# Patient Record
Sex: Male | Born: 1965 | Race: White | Hispanic: No | Marital: Married | Smoking: Never smoker
Health system: Southern US, Community
[De-identification: ages and names within clinical notes are randomized; demographics above are authoritative.]

## PROBLEM LIST (undated history)

## (undated) DIAGNOSIS — I10 Essential (primary) hypertension: Secondary | ICD-10-CM

## (undated) DIAGNOSIS — I219 Acute myocardial infarction, unspecified: Secondary | ICD-10-CM

## (undated) DIAGNOSIS — K649 Unspecified hemorrhoids: Secondary | ICD-10-CM

## (undated) DIAGNOSIS — I639 Cerebral infarction, unspecified: Secondary | ICD-10-CM

## (undated) DIAGNOSIS — G4730 Sleep apnea, unspecified: Secondary | ICD-10-CM

## (undated) DIAGNOSIS — F329 Major depressive disorder, single episode, unspecified: Secondary | ICD-10-CM

## (undated) DIAGNOSIS — I2510 Atherosclerotic heart disease of native coronary artery without angina pectoris: Secondary | ICD-10-CM

## (undated) DIAGNOSIS — E785 Hyperlipidemia, unspecified: Secondary | ICD-10-CM

## (undated) DIAGNOSIS — J302 Other seasonal allergic rhinitis: Secondary | ICD-10-CM

## (undated) DIAGNOSIS — E109 Type 1 diabetes mellitus without complications: Secondary | ICD-10-CM

## (undated) DIAGNOSIS — Z9981 Dependence on supplemental oxygen: Secondary | ICD-10-CM

## (undated) DIAGNOSIS — F419 Anxiety disorder, unspecified: Secondary | ICD-10-CM

## (undated) DIAGNOSIS — G40909 Epilepsy, unspecified, not intractable, without status epilepticus: Secondary | ICD-10-CM

## (undated) DIAGNOSIS — F32A Depression, unspecified: Secondary | ICD-10-CM

## (undated) DIAGNOSIS — S92911A Unspecified fracture of right toe(s), initial encounter for closed fracture: Secondary | ICD-10-CM

## (undated) DIAGNOSIS — H269 Unspecified cataract: Secondary | ICD-10-CM

## (undated) HISTORY — DX: Essential (primary) hypertension: I10

## (undated) HISTORY — DX: Anxiety disorder, unspecified: F41.9

## (undated) HISTORY — PX: CORONARY ANGIOPLASTY: SHX604

## (undated) HISTORY — DX: Type 1 diabetes mellitus without complications: E10.9

## (undated) HISTORY — PX: CATARACT EXTRACTION, BILATERAL: SHX1313

## (undated) HISTORY — DX: Major depressive disorder, single episode, unspecified: F32.9

## (undated) HISTORY — DX: Acute myocardial infarction, unspecified: I21.9

## (undated) HISTORY — DX: Other seasonal allergic rhinitis: J30.2

## (undated) HISTORY — PX: CORONARY ARTERY BYPASS GRAFT: SHX141

## (undated) HISTORY — PX: TONSILLECTOMY: SUR1361

## (undated) HISTORY — DX: Atherosclerotic heart disease of native coronary artery without angina pectoris: I25.10

## (undated) HISTORY — PX: APPENDECTOMY: SHX54

## (undated) HISTORY — DX: Epilepsy, unspecified, not intractable, without status epilepticus: G40.909

## (undated) HISTORY — DX: Cerebral infarction, unspecified: I63.9

## (undated) HISTORY — PX: CARDIAC CATHETERIZATION: SHX172

## (undated) HISTORY — DX: Hyperlipidemia, unspecified: E78.5

## (undated) HISTORY — DX: Depression, unspecified: F32.A

## (undated) HISTORY — DX: Sleep apnea, unspecified: G47.30

## (undated) SURGERY — APPENDECTOMY, LAPAROSCOPIC
Anesthesia: General

---

## 1998-06-06 ENCOUNTER — Ambulatory Visit (HOSPITAL_COMMUNITY): Admission: RE | Admit: 1998-06-06 | Discharge: 1998-06-06 | Payer: Self-pay | Admitting: *Deleted

## 1998-07-01 ENCOUNTER — Encounter: Payer: Self-pay | Admitting: Otolaryngology

## 1998-07-01 ENCOUNTER — Ambulatory Visit (HOSPITAL_COMMUNITY): Admission: RE | Admit: 1998-07-01 | Discharge: 1998-07-01 | Payer: Self-pay | Admitting: Otolaryngology

## 1998-07-02 ENCOUNTER — Encounter: Payer: Self-pay | Admitting: Otolaryngology

## 1998-07-02 ENCOUNTER — Ambulatory Visit (HOSPITAL_COMMUNITY): Admission: RE | Admit: 1998-07-02 | Discharge: 1998-07-02 | Payer: Self-pay | Admitting: Otolaryngology

## 1998-08-15 ENCOUNTER — Encounter: Payer: Self-pay | Admitting: Otolaryngology

## 1998-08-15 ENCOUNTER — Ambulatory Visit (HOSPITAL_COMMUNITY): Admission: RE | Admit: 1998-08-15 | Discharge: 1998-08-16 | Payer: Self-pay | Admitting: Otolaryngology

## 1999-03-05 ENCOUNTER — Ambulatory Visit (HOSPITAL_COMMUNITY): Admission: RE | Admit: 1999-03-05 | Discharge: 1999-03-05 | Payer: Self-pay | Admitting: Otolaryngology

## 1999-03-05 ENCOUNTER — Encounter: Payer: Self-pay | Admitting: Otolaryngology

## 1999-05-02 ENCOUNTER — Encounter: Payer: Self-pay | Admitting: Neurosurgery

## 1999-05-02 ENCOUNTER — Ambulatory Visit (HOSPITAL_COMMUNITY): Admission: RE | Admit: 1999-05-02 | Discharge: 1999-05-02 | Payer: Self-pay | Admitting: Neurosurgery

## 1999-08-12 ENCOUNTER — Encounter: Payer: Self-pay | Admitting: Neurosurgery

## 1999-08-12 ENCOUNTER — Ambulatory Visit (HOSPITAL_COMMUNITY): Admission: RE | Admit: 1999-08-12 | Discharge: 1999-08-12 | Payer: Self-pay | Admitting: Neurosurgery

## 1999-08-13 ENCOUNTER — Ambulatory Visit (HOSPITAL_COMMUNITY): Admission: RE | Admit: 1999-08-13 | Discharge: 1999-08-13 | Payer: Self-pay | Admitting: Neurosurgery

## 1999-08-13 ENCOUNTER — Encounter: Payer: Self-pay | Admitting: Neurosurgery

## 2000-02-11 ENCOUNTER — Ambulatory Visit (HOSPITAL_COMMUNITY): Admission: RE | Admit: 2000-02-11 | Discharge: 2000-02-11 | Payer: Self-pay | Admitting: Neurology

## 2000-02-11 ENCOUNTER — Encounter: Payer: Self-pay | Admitting: Neurology

## 2000-03-15 ENCOUNTER — Encounter: Payer: Self-pay | Admitting: Internal Medicine

## 2000-03-15 ENCOUNTER — Encounter: Admission: RE | Admit: 2000-03-15 | Discharge: 2000-03-15 | Payer: Self-pay | Admitting: Internal Medicine

## 2002-11-23 ENCOUNTER — Ambulatory Visit (HOSPITAL_COMMUNITY): Admission: RE | Admit: 2002-11-23 | Discharge: 2002-11-23 | Payer: Self-pay | Admitting: Neurology

## 2002-11-23 ENCOUNTER — Encounter: Payer: Self-pay | Admitting: Neurology

## 2004-11-04 ENCOUNTER — Ambulatory Visit (HOSPITAL_COMMUNITY): Admission: RE | Admit: 2004-11-04 | Discharge: 2004-11-04 | Payer: Self-pay | Admitting: Neurology

## 2005-08-01 ENCOUNTER — Emergency Department (HOSPITAL_COMMUNITY): Admission: EM | Admit: 2005-08-01 | Discharge: 2005-08-01 | Payer: Self-pay | Admitting: Emergency Medicine

## 2006-01-25 ENCOUNTER — Emergency Department (HOSPITAL_COMMUNITY): Admission: EM | Admit: 2006-01-25 | Discharge: 2006-01-25 | Payer: Self-pay | Admitting: Emergency Medicine

## 2006-05-10 ENCOUNTER — Emergency Department (HOSPITAL_COMMUNITY): Admission: EM | Admit: 2006-05-10 | Discharge: 2006-05-10 | Payer: Self-pay | Admitting: Emergency Medicine

## 2006-05-10 ENCOUNTER — Encounter: Admission: RE | Admit: 2006-05-10 | Discharge: 2006-05-10 | Payer: Self-pay | Admitting: Neurology

## 2007-06-26 ENCOUNTER — Ambulatory Visit: Payer: Self-pay | Admitting: Gastroenterology

## 2007-06-26 LAB — CONVERTED CEMR LAB
Eosinophils Absolute: 0.1 10*3/uL (ref 0.0–0.6)
Eosinophils Relative: 2.2 % (ref 0.0–5.0)
Hemoglobin: 15.3 g/dL (ref 13.0–17.0)
Lymphocytes Relative: 26.4 % (ref 12.0–46.0)
Platelets: 237 10*3/uL (ref 150–400)
RBC: 4.78 M/uL (ref 4.22–5.81)
WBC: 5.1 10*3/uL (ref 4.5–10.5)

## 2007-07-25 ENCOUNTER — Ambulatory Visit: Payer: Self-pay | Admitting: Gastroenterology

## 2007-09-15 ENCOUNTER — Encounter: Admission: RE | Admit: 2007-09-15 | Discharge: 2007-09-15 | Payer: Self-pay | Admitting: Internal Medicine

## 2007-10-27 HISTORY — PX: OTHER SURGICAL HISTORY: SHX169

## 2007-11-24 ENCOUNTER — Ambulatory Visit (HOSPITAL_BASED_OUTPATIENT_CLINIC_OR_DEPARTMENT_OTHER): Admission: RE | Admit: 2007-11-24 | Discharge: 2007-11-24 | Payer: Self-pay | Admitting: Orthopedic Surgery

## 2008-01-09 ENCOUNTER — Ambulatory Visit (HOSPITAL_BASED_OUTPATIENT_CLINIC_OR_DEPARTMENT_OTHER): Admission: RE | Admit: 2008-01-09 | Discharge: 2008-01-09 | Payer: Self-pay | Admitting: Orthopedic Surgery

## 2008-01-18 ENCOUNTER — Encounter (INDEPENDENT_AMBULATORY_CARE_PROVIDER_SITE_OTHER): Payer: Self-pay | Admitting: Otolaryngology

## 2008-01-19 ENCOUNTER — Inpatient Hospital Stay (HOSPITAL_COMMUNITY): Admission: AD | Admit: 2008-01-19 | Discharge: 2008-01-20 | Payer: Self-pay | Admitting: Otolaryngology

## 2009-10-12 ENCOUNTER — Inpatient Hospital Stay (HOSPITAL_COMMUNITY): Admission: EM | Admit: 2009-10-12 | Discharge: 2009-10-23 | Payer: Self-pay | Admitting: Emergency Medicine

## 2009-10-12 ENCOUNTER — Ambulatory Visit: Payer: Self-pay | Admitting: Internal Medicine

## 2009-10-14 ENCOUNTER — Encounter: Payer: Self-pay | Admitting: Cardiothoracic Surgery

## 2009-10-14 ENCOUNTER — Ambulatory Visit: Payer: Self-pay | Admitting: Cardiothoracic Surgery

## 2009-10-15 ENCOUNTER — Encounter: Payer: Self-pay | Admitting: Cardiology

## 2009-11-03 ENCOUNTER — Ambulatory Visit: Payer: Self-pay | Admitting: Thoracic Surgery (Cardiothoracic Vascular Surgery)

## 2009-11-10 ENCOUNTER — Ambulatory Visit: Payer: Self-pay | Admitting: Cardiothoracic Surgery

## 2009-11-10 ENCOUNTER — Encounter: Admission: RE | Admit: 2009-11-10 | Discharge: 2009-11-10 | Payer: Self-pay | Admitting: Cardiothoracic Surgery

## 2009-11-17 ENCOUNTER — Ambulatory Visit: Payer: Self-pay | Admitting: Cardiothoracic Surgery

## 2009-11-25 ENCOUNTER — Encounter: Payer: Self-pay | Admitting: Cardiovascular Disease

## 2009-11-26 ENCOUNTER — Encounter: Payer: Self-pay | Admitting: Cardiovascular Disease

## 2009-11-26 ENCOUNTER — Ambulatory Visit: Payer: Self-pay | Admitting: Cardiothoracic Surgery

## 2009-11-28 ENCOUNTER — Ambulatory Visit: Payer: Self-pay | Admitting: Cardiovascular Disease

## 2009-11-28 DIAGNOSIS — I214 Non-ST elevation (NSTEMI) myocardial infarction: Secondary | ICD-10-CM

## 2009-11-28 DIAGNOSIS — I25810 Atherosclerosis of coronary artery bypass graft(s) without angina pectoris: Secondary | ICD-10-CM | POA: Insufficient documentation

## 2009-11-28 DIAGNOSIS — E785 Hyperlipidemia, unspecified: Secondary | ICD-10-CM

## 2009-12-03 ENCOUNTER — Ambulatory Visit: Payer: Self-pay | Admitting: Cardiothoracic Surgery

## 2009-12-08 ENCOUNTER — Ambulatory Visit: Payer: Self-pay | Admitting: Cardiothoracic Surgery

## 2009-12-15 ENCOUNTER — Ambulatory Visit: Payer: Self-pay | Admitting: Cardiothoracic Surgery

## 2009-12-16 ENCOUNTER — Ambulatory Visit (HOSPITAL_COMMUNITY): Admission: RE | Admit: 2009-12-16 | Discharge: 2009-12-16 | Payer: Self-pay | Admitting: Cardiovascular Disease

## 2009-12-16 ENCOUNTER — Ambulatory Visit: Payer: Self-pay | Admitting: Internal Medicine

## 2009-12-16 ENCOUNTER — Ambulatory Visit: Payer: Self-pay

## 2009-12-16 ENCOUNTER — Encounter: Payer: Self-pay | Admitting: Cardiovascular Disease

## 2009-12-19 ENCOUNTER — Encounter: Payer: Self-pay | Admitting: Cardiovascular Disease

## 2009-12-26 ENCOUNTER — Ambulatory Visit: Payer: Self-pay | Admitting: Cardiovascular Disease

## 2009-12-31 ENCOUNTER — Ambulatory Visit: Payer: Self-pay | Admitting: Cardiothoracic Surgery

## 2010-01-01 ENCOUNTER — Encounter (HOSPITAL_COMMUNITY): Admission: RE | Admit: 2010-01-01 | Discharge: 2010-03-27 | Payer: Self-pay | Admitting: Cardiovascular Disease

## 2010-01-07 ENCOUNTER — Encounter: Payer: Self-pay | Admitting: Cardiovascular Disease

## 2010-01-29 ENCOUNTER — Encounter: Payer: Self-pay | Admitting: Cardiovascular Disease

## 2010-02-20 ENCOUNTER — Encounter: Payer: Self-pay | Admitting: Cardiovascular Disease

## 2010-03-03 ENCOUNTER — Telehealth: Payer: Self-pay | Admitting: Cardiovascular Disease

## 2010-03-04 ENCOUNTER — Ambulatory Visit: Payer: Self-pay | Admitting: Cardiovascular Disease

## 2010-03-05 ENCOUNTER — Inpatient Hospital Stay (HOSPITAL_BASED_OUTPATIENT_CLINIC_OR_DEPARTMENT_OTHER): Admission: RE | Admit: 2010-03-05 | Discharge: 2010-03-05 | Payer: Self-pay | Admitting: Cardiovascular Disease

## 2010-03-05 ENCOUNTER — Ambulatory Visit: Payer: Self-pay | Admitting: Cardiovascular Disease

## 2010-03-10 ENCOUNTER — Observation Stay (HOSPITAL_COMMUNITY): Admission: RE | Admit: 2010-03-10 | Discharge: 2010-03-11 | Payer: Self-pay | Admitting: Cardiovascular Disease

## 2010-03-10 ENCOUNTER — Ambulatory Visit: Payer: Self-pay | Admitting: Cardiology

## 2010-03-24 ENCOUNTER — Encounter: Payer: Self-pay | Admitting: Cardiovascular Disease

## 2010-03-25 ENCOUNTER — Telehealth (INDEPENDENT_AMBULATORY_CARE_PROVIDER_SITE_OTHER): Payer: Self-pay | Admitting: *Deleted

## 2010-03-26 ENCOUNTER — Telehealth (INDEPENDENT_AMBULATORY_CARE_PROVIDER_SITE_OTHER): Payer: Self-pay | Admitting: *Deleted

## 2010-03-28 ENCOUNTER — Encounter (HOSPITAL_COMMUNITY): Admission: RE | Admit: 2010-03-28 | Discharge: 2010-05-09 | Payer: Self-pay | Admitting: Cardiovascular Disease

## 2010-04-12 ENCOUNTER — Inpatient Hospital Stay (HOSPITAL_COMMUNITY)
Admission: EM | Admit: 2010-04-12 | Discharge: 2010-04-15 | Payer: Self-pay | Source: Home / Self Care | Admitting: Emergency Medicine

## 2010-04-13 ENCOUNTER — Ambulatory Visit: Payer: Self-pay | Admitting: Cardiology

## 2010-04-13 ENCOUNTER — Encounter: Payer: Self-pay | Admitting: Cardiovascular Disease

## 2010-04-20 ENCOUNTER — Encounter: Payer: Self-pay | Admitting: Cardiovascular Disease

## 2010-04-23 ENCOUNTER — Ambulatory Visit: Payer: Self-pay | Admitting: Cardiovascular Disease

## 2010-05-06 ENCOUNTER — Telehealth: Payer: Self-pay | Admitting: Cardiovascular Disease

## 2010-05-11 ENCOUNTER — Encounter (HOSPITAL_COMMUNITY)
Admission: RE | Admit: 2010-05-11 | Discharge: 2010-07-28 | Payer: Self-pay | Source: Home / Self Care | Attending: Cardiovascular Disease | Admitting: Cardiovascular Disease

## 2010-07-06 ENCOUNTER — Encounter: Payer: Self-pay | Admitting: Cardiovascular Disease

## 2010-07-15 ENCOUNTER — Encounter: Payer: Self-pay | Admitting: Cardiovascular Disease

## 2010-07-16 ENCOUNTER — Ambulatory Visit
Admission: RE | Admit: 2010-07-16 | Discharge: 2010-07-16 | Payer: Self-pay | Source: Home / Self Care | Attending: Cardiovascular Disease | Admitting: Cardiovascular Disease

## 2010-07-23 ENCOUNTER — Encounter: Payer: Self-pay | Admitting: Cardiovascular Disease

## 2010-07-26 LAB — CONVERTED CEMR LAB
BUN: 12 mg/dL (ref 6–23)
Basophils Relative: 0.6 % (ref 0.0–3.0)
Calcium: 9.2 mg/dL (ref 8.4–10.5)
Creatinine, Ser: 0.7 mg/dL (ref 0.4–1.5)
Eosinophils Relative: 2.5 % (ref 0.0–5.0)
GFR calc non Af Amer: 135.63 mL/min (ref >60)
Glucose, Bld: 105 mg/dL — ABNORMAL HIGH (ref 70–99)
INR: 1 (ref 0.8–1.0)
Lymphocytes Relative: 23.6 % (ref 12.0–46.0)
Lymphs Abs: 1.3 10*3/uL (ref 0.7–4.0)
MCHC: 34.4 g/dL (ref 30.0–36.0)
Monocytes Absolute: 0.6 10*3/uL (ref 0.1–1.0)
Monocytes Relative: 11.7 % (ref 3.0–12.0)
Neutro Abs: 3.4 10*3/uL (ref 1.4–7.7)
Neutrophils Relative %: 61.6 % (ref 43.0–77.0)
Prothrombin Time: 10.9 s (ref 9.7–11.8)
RBC: 4.71 M/uL (ref 4.22–5.81)
WBC: 5.5 10*3/uL (ref 4.5–10.5)

## 2010-07-29 ENCOUNTER — Emergency Department (HOSPITAL_COMMUNITY): Payer: No Typology Code available for payment source

## 2010-07-29 ENCOUNTER — Emergency Department (HOSPITAL_COMMUNITY)
Admission: EM | Admit: 2010-07-29 | Discharge: 2010-07-29 | Disposition: A | Discharge status: Home / Self Care | Payer: No Typology Code available for payment source | Attending: Emergency Medicine | Admitting: Emergency Medicine

## 2010-07-29 ENCOUNTER — Encounter (HOSPITAL_COMMUNITY): Payer: Self-pay | Attending: Cardiovascular Disease

## 2010-07-29 DIAGNOSIS — E785 Hyperlipidemia, unspecified: Secondary | ICD-10-CM | POA: Insufficient documentation

## 2010-07-29 DIAGNOSIS — I2510 Atherosclerotic heart disease of native coronary artery without angina pectoris: Secondary | ICD-10-CM | POA: Insufficient documentation

## 2010-07-29 DIAGNOSIS — Z794 Long term (current) use of insulin: Secondary | ICD-10-CM | POA: Insufficient documentation

## 2010-07-29 DIAGNOSIS — Y929 Unspecified place or not applicable: Secondary | ICD-10-CM | POA: Insufficient documentation

## 2010-07-29 DIAGNOSIS — Z9889 Other specified postprocedural states: Secondary | ICD-10-CM | POA: Insufficient documentation

## 2010-07-29 DIAGNOSIS — G4733 Obstructive sleep apnea (adult) (pediatric): Secondary | ICD-10-CM | POA: Insufficient documentation

## 2010-07-29 DIAGNOSIS — S20219A Contusion of unspecified front wall of thorax, initial encounter: Secondary | ICD-10-CM | POA: Insufficient documentation

## 2010-07-29 DIAGNOSIS — Z79899 Other long term (current) drug therapy: Secondary | ICD-10-CM | POA: Insufficient documentation

## 2010-07-29 DIAGNOSIS — I319 Disease of pericardium, unspecified: Secondary | ICD-10-CM | POA: Insufficient documentation

## 2010-07-29 DIAGNOSIS — I252 Old myocardial infarction: Secondary | ICD-10-CM | POA: Insufficient documentation

## 2010-07-29 DIAGNOSIS — Z9641 Presence of insulin pump (external) (internal): Secondary | ICD-10-CM | POA: Insufficient documentation

## 2010-07-29 DIAGNOSIS — R071 Chest pain on breathing: Secondary | ICD-10-CM | POA: Insufficient documentation

## 2010-07-29 DIAGNOSIS — Z5189 Encounter for other specified aftercare: Secondary | ICD-10-CM | POA: Insufficient documentation

## 2010-07-29 DIAGNOSIS — Q8500 Neurofibromatosis, unspecified: Secondary | ICD-10-CM | POA: Insufficient documentation

## 2010-07-29 DIAGNOSIS — Z951 Presence of aortocoronary bypass graft: Secondary | ICD-10-CM | POA: Insufficient documentation

## 2010-07-29 DIAGNOSIS — E109 Type 1 diabetes mellitus without complications: Secondary | ICD-10-CM | POA: Insufficient documentation

## 2010-07-29 DIAGNOSIS — I10 Essential (primary) hypertension: Secondary | ICD-10-CM | POA: Insufficient documentation

## 2010-07-29 DIAGNOSIS — I498 Other specified cardiac arrhythmias: Secondary | ICD-10-CM | POA: Insufficient documentation

## 2010-07-29 DIAGNOSIS — E119 Type 2 diabetes mellitus without complications: Secondary | ICD-10-CM | POA: Insufficient documentation

## 2010-07-29 DIAGNOSIS — F341 Dysthymic disorder: Secondary | ICD-10-CM | POA: Insufficient documentation

## 2010-07-29 LAB — CBC
HCT: 42.2 % (ref 39.0–52.0)
Hemoglobin: 14.6 g/dL (ref 13.0–17.0)
MCHC: 34.6 g/dL (ref 30.0–36.0)
MCV: 88.7 fL (ref 78.0–100.0)
RBC: 4.76 MIL/uL (ref 4.22–5.81)
RDW: 12.8 % (ref 11.5–15.5)

## 2010-07-29 LAB — POCT CARDIAC MARKERS

## 2010-07-29 LAB — BASIC METABOLIC PANEL
BUN: 10 mg/dL (ref 6–23)
CO2: 28 mEq/L (ref 19–32)
GFR calc Af Amer: >60 mL/min (ref >60)
GFR calc non Af Amer: >60 mL/min (ref >60)
Glucose, Bld: 109 mg/dL — ABNORMAL HIGH (ref 70–99)
Sodium: 141 mEq/L (ref 135–145)

## 2010-07-29 LAB — DIFFERENTIAL
Basophils Absolute: 0 10*3/uL (ref 0.0–0.1)
Eosinophils Absolute: 0.1 10*3/uL (ref 0.0–0.7)
Eosinophils Relative: 2 % (ref 0–5)
Lymphs Abs: 1 10*3/uL (ref 0.7–4.0)

## 2010-07-29 LAB — GLUCOSE, CAPILLARY: Glucose-Capillary: 89 mg/dL (ref 70–99)

## 2010-07-30 ENCOUNTER — Telehealth (INDEPENDENT_AMBULATORY_CARE_PROVIDER_SITE_OTHER): Payer: Self-pay | Admitting: *Deleted

## 2010-07-30 NOTE — Assessment & Plan Note (Signed)
Summary: eph/jss  Medications Added TYLENOL 325 MG TABS (ACETAMINOPHEN) as needed LISINOPRIL 20 MG TABS (LISINOPRIL) Take one tablet by mouth daily METOPROLOL TARTRATE 50 MG TABS (METOPROLOL TARTRATE) take 1 tablet twice a day VYTORIN 10-80 MG TABS (EZETIMIBE-SIMVASTATIN) Take one tablet by mouth dailyat bedtimer ASPIRIN 81 MG TBEC (ASPIRIN) Take one tablet by mouth daily TEGRETOL XR 400 MG XR12H-TAB (CARBAMAZEPINE) take 2 tablets two times a day TEGRETOL 200 MG TABS (CARBAMAZEPINE) Take 1 tablet by mouth two times a day LEVETIRACETAM 500 MG TABS (LEVETIRACETAM) Take 1/2 tablet in the morning and 1 tablet in the evening. ZOLOFT 100 MG TABS (SERTRALINE HCL) Take 1 tablet by mouth once a day FOLIC ACID 1 MG TABS (FOLIC ACID) Take 1 tablet by mouth once a day FERREX 150 FORTE 150-25-1 MG-MCG-MG CAPS (IRON POLYSACCH CMPLX-B12-FA) Take 1 tablet by mouth once a day ONE-A-DAY MENS  TABS (MULTIPLE VITAMIN) Take 1 tablet by mouth once a day TRAMADOL HCL 50 MG TABS (TRAMADOL HCL) as needed * NOVOLOG INSULIN PUM as directed      Allergies Added: NKDA  Visit Type:  Post hospital Primary Provider:  Rodrigo Ran, M.D.  CC:  Chest soreness.  History of Present Illness: 45 year-old male presenting for hospital follow-up evaluation after sustaining a NSTEMI and requiring multivessel CABG in April 2011. He has longstanding Type I DM diagnosed age 89, on an insulin pump since 2008. He has a nonhealing wound at the upper portion of his sternotomy.   At present he is doing well. Complains of distal legs feeling cool. Denies dyspnea, edema, fever, chills. complains of chest pain/discomfort around sternotomy, but different from anginal pain.   Current Medications (verified): 1)  Tylenol 325 Mg Tabs (Acetaminophen) .... As Needed 2)  Lisinopril 20 Mg Tabs (Lisinopril) .... Take One Tablet By Mouth Daily 3)  Metoprolol Tartrate 100 Mg Tabs (Metoprolol Tartrate) .... Take One Tablet By Mouth Twice A Day 4)   Vytorin 10-80 Mg Tabs (Ezetimibe-Simvastatin) .... Take One Tablet By Mouth Dailyat Bedtimer 5)  Aspirin 81 Mg Tbec (Aspirin) .... Take One Tablet By Mouth Daily 6)  Tegretol Xr 400 Mg Xr12h-Tab (Carbamazepine) .... Take 2 Tablets Two Times A Day 7)  Tegretol 200 Mg Tabs (Carbamazepine) .... Take 1 Tablet By Mouth Two Times A Day 8)  Levetiracetam 500 Mg Tabs (Levetiracetam) .... Take 1 Tablet By Mouth Two Times A Day 9)  Zoloft 100 Mg Tabs (Sertraline Hcl) .... Take 1 Tablet By Mouth Once A Day 10)  Folic Acid 1 Mg Tabs (Folic Acid) .... Take 1 Tablet By Mouth Once A Day 11)  Ferrex 150 Forte 150-25-1 Mg-Mcg-Mg Caps (Iron Polysacch Cmplx-B12-Fa) .... Take 1 Tablet By Mouth Once A Day 12)  One-A-Day Mens  Tabs (Multiple Vitamin) .... Take 1 Tablet By Mouth Once A Day 13)  Tramadol Hcl 50 Mg Tabs (Tramadol Hcl) .... As Needed 14)  Novolog Insulin Pum .... As Directed  Allergies (verified): No Known Drug Allergies  Past History:  Past medical history reviewed for relevance to current acute and chronic problems.  Past Medical History:  Status post multivessel bypass grafting, October 16, 2009, for severe   multivessel disease with unstable angina.   Positive non-ST-segment elevation myocardial infarction.   Type 1 diabetes on insulin pump at home.   Sleep apnea status post oral surgery.   Hyperlipidemia.   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  Diabetic retinopathy status post vitrectomy  and history of retinal  surgery.   Hypertension Seizure disorder  Seasonal allergic rhinitis.  Depression  Review of Systems       Negative except as per HPI   Vital Signs:  Patient profile:   45 year old male Height:      72 inches Weight:      228 pounds BMI:     31.91 Pulse rate:   79 / minute Pulse rhythm:   regular Resp:     18 per minute BP sitting:   100 / 60  (left arm) Cuff size:   large  Vitals Entered By: Vikki Ports  (November 28, 2009 10:48 AM)  Physical Exam  General:  Pt is alert and oriented, in no acute distress. HEENT: normal Neck: normal carotid upstrokes without bruits, JVP normal Lungs: CTA Chest: visualized portion of sternotomy ok - upper sternotomy has a dressing in place. CV: RRR without murmur or gallop Abd: soft, NT, positive BS, no bruit, no organomegaly Ext: no clubbing, cyanosis, or edema. peripheral pulses 2+ and equal Skin: warm and dry without rash    EKG  Procedure date:  11/28/2009  Findings:      NSR, right axis deviation, inferior/inferolateral ST elevation with PR depression may be pericarditis.   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 His EKG raises question of pericarditis with PR depression,  but ST elevation is localized to inferolateral leads. ST shape does not look like acute injury and pt does not have symptoms suggestive of this. Recommend 2D echo to evaluate for effusion and LV wall motion. BP is in low-normal range and pt has some lightheadedness - metoprolol is new to him and he is on a high-dose from the hospital....will decrease to 50 mg two times a day. Follow-up 4 wks.  His updated medication list for this problem includes:    Lisinopril 20 Mg Tabs (Lisinopril) .[name]... Take one tablet by mouth daily    Metoprolol Tartrate 50 Mg Tabs (Metoprolol tartrate) .[name]... Take 1 tablet twice a day    Aspirin 81 Mg Tbec (Aspirin) .[name]... Take one tablet by mouth daily  Orders: EKG w/ Interpretation (93000) Echocardiogram (Echo)  Problem # 2:  HYPERLIPIDEMIA-MIXED (ICD-272.4) Pt now on Vytorin 10/80 mg, which was changed during his recent hospital stay. He was previouslyon 10/40 mg. He is due to have lipids checked by Dr Waynard Edwards in 2 wks. I would favor crestor 40 mg or return to vytorin 10/40 mg pending those lab results, but will defer to Dr Waynard Edwards. Pt somewhat dependent on med samples he  receives through [employer]. We can sample meds here when he returns next month if that helps.  His updated medication list for this problem includes:    Vytorin 10-80 Mg Tabs (Ezetimibe-simvastatin) .[name]... Take one tablet by mouth dailyat bedtimer  Patient Instructions: 1)  Your physician recommends that you schedule a follow-up appointment in: 4 weeks with Dr. Excell Seltzer 2)  Your physician has recommended you make the following change in your medication:  Metoprolol Tartrate 50mg  1 tablet twice a day. 3)  Your physician has requested that you have an echocardiogram.  Echocardiography is a painless test that uses sound waves to create images of your heart. It provides your doctor with information about the size and shape of your heart and how well your heart's chambers and valves are working.  This procedure takes approximately one hour. There are no restrictions  for this procedure. Prescriptions: METOPROLOL TARTRATE 50 MG TABS (METOPROLOL TARTRATE) take 1 tablet twice a day  #60 x 6   Entered by:   Lisabeth Devoid RN   Authorized by:   Norva Karvonen, MD   Signed by:   Lisabeth Devoid RN on 11/28/2009   Method used:   Electronically to        [facility]. [phone]* (retail)       [phone]       [place], [place]  [address]       Ph: [phone]       Fax: [phone]   RxID:   [id number]

## 2010-07-30 NOTE — Consult Note (Signed)
Summary: [facility]  [facility]   Imported By: Roderic Ovens 05/11/2010 09:27:44  _____________________________________________________________________  External Attachment:    Type:   Image     Comment:   External Document

## 2010-07-30 NOTE — Letter (Signed)
Summary: [name]   Imported By: Roderic Ovens 05/13/2010 11:16:24  _____________________________________________________________________  External Attachment:    Type:   Image     Comment:   External Document

## 2010-07-30 NOTE — Assessment & Plan Note (Signed)
Summary: eph/jml   Visit Type:  Post-hospital Primary Provider:  Rodrigo Ran, M.D.  CC:  Some discomfort cath site. [name]  History of Present Illness: 45 yo WM with history of HTN, Hyperlipidemia, DM and CAD who underwent 4V CABG on October 16, 2009 after presentation with a NSTEMI - he had early graft closure within 6 months and required multivessel stenting after developing recurrent Class 3 angina. He then was hospitalized for DKA and chest pain and ruled in for a NSTEMI once again...repeat catheterization demonstrated patency of his stents.  He is doing well at present. Complains of soreness at the right groin cath site. No chest pain or dyspnea. He is back in cardiac rehab and has no exeritonal symptoms over the past few sessions since hospital discharge.  Current Medications (verified): 1)  Tylenol 325 Mg Tabs (Acetaminophen) .... As Needed 2)  Lisinopril 20 Mg Tabs (Lisinopril) .... Take One Tablet By Mouth Daily 3)  Metoprolol Tartrate 25 Mg Tabs (Metoprolol Tartrate) .... Take One Tablet By Mouth Twice A Day 4)  Vytorin 10-80 Mg Tabs (Ezetimibe-Simvastatin) .... Take One Tablet By Mouth Daily At Bedtime 5)  Aspirin 81 Mg Tbec (Aspirin) .... Take One Tablet By Mouth Daily 6)  Tegretol Xr 400 Mg Xr12h-Tab (Carbamazepine) .... Take 2 Tablets Two Times A Day 7)  Tegretol 200 Mg Tabs (Carbamazepine) .... Take 1 Tablet By Mouth Two Times A Day 8)  Levetiracetam 500 Mg Tabs (Levetiracetam) .... Take 1/2 Tablet in The Morning and 1 Tablet in The Evening. 9)  Zoloft 100 Mg Tabs (Sertraline Hcl) .... Take 1 Tablet By Mouth Once A Day 10)  One-A-Day Mens  Tabs (Multiple Vitamin) .... Take 1 Tablet By Mouth Once A Day 11)  Novolog Insulin Pump .... As Directed 12)  Oxigen 2 Litters .... At Bedtime 13)  Effient 10 Mg Tabs (Prasugrel Hcl) .... Take One Tablet By Mouth Daily  Allergies (verified): No Known Drug Allergies  Past History:  Past medical history reviewed for relevance to current  acute and chronic problems.  Past Medical History: Reviewed history from 11/28/2009 and no changes required.  Status post multivessel bypass grafting, October 16, 2009, for severe   multivessel disease with unstable angina.   Positive non-ST-segment elevation myocardial infarction.   Type 1 diabetes on insulin pump at home.   Sleep apnea status post oral surgery.   Hyperlipidemia.   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  Diabetic retinopathy status post vitrectomy and history of retinal  surgery.   Hypertension Seizure disorder  Seasonal allergic rhinitis.  Depression  Review of Systems       Negative except as per HPI   Vital Signs:  Patient profile:   45 year old male Height:      72 inches Weight:      224.25 pounds BMI:     30.52 Pulse rate:   80 / minute Pulse rhythm:   regular Resp:     18 per minute BP sitting:   104 / 64  (left arm) Cuff size:   large  Vitals Entered By: Vikki Ports (April 23, 2010 12:00 PM)  Physical Exam  General:  Pt is alert and oriented, in no acute distress. HEENT: normal Neck: normal carotid upstrokes without bruits, JVP normal Lungs: CTA CV: RRR without murmur or gallop Abd: soft, NT, positive BS, no bruit, no organomegaly Ext: no clubbing, cyanosis, or edema. right groin ecchymoses noted. no femoral  bruit. 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 Type 2 NSTEMI from DKA is likely explanation for his event. Continue current therapy. He will complete cardiac rehab soon and we discussed the importance of maintaining an exercise program. Will write a letter in support of full disability based on his cardiac limitations at the patient's  request.  His updated medication list for this problem includes:    Lisinopril 20 Mg Tabs (Lisinopril) .[name]... Take one tablet by mouth daily    Metoprolol Tartrate 25 Mg Tabs (Metoprolol tartrate) .[name]... Take one tablet by mouth twice a day    Aspirin 81 Mg Tbec (Aspirin) .[name]... Take one tablet by mouth daily    Effient 10 Mg Tabs (Prasugrel hcl) .[name]... Take one tablet by mouth daily  Orders: EKG w/ Interpretation (93000)  Problem # 2:  HYPERLIPIDEMIA-MIXED (ICD-272.4) Followed by Dr Waynard Edwards. Goal LDL less than 70 mg/dL.  His updated medication list for this problem includes:    Vytorin 10-80 Mg Tabs (Ezetimibe-simvastatin) .[name]... Take one tablet by mouth daily at bedtime  Patient Instructions: 1)  Your physician recommends that you schedule a follow-up appointment in: 3 months 2)  Your physician recommends that you continue on your current medications as directed. Please refer to the Current Medication list given to you today.

## 2010-07-30 NOTE — Miscellaneous (Signed)
Summary: [facility] Order/Treatment Plan  [facility] Order[facility] Plan   Imported By: Roderic Ovens 12/01/2009 12:01:18  _____________________________________________________________________  External Attachment:    Type:   Image     Comment:   External Document

## 2010-07-30 NOTE — Miscellaneous (Signed)
Summary: [facility] Progress Note   [facility] Progress Note   Imported By: Roderic Ovens 02/04/2010 12:33:52  _____________________________________________________________________  External Attachment:    Type:   Image     Comment:   External Document

## 2010-07-30 NOTE — Progress Notes (Signed)
Summary: letter for [employer]  Phone Note Call from Patient Call back at [employer] [phone]   Caller: Patient Reason for Call: Talk to Nurse Summary of Call: would like toi know if letter was mailed to [employer]  Initial call taken by: Roe Coombs,  May 06, 2010 4:27 PM  Follow-up for Phone Call        spoke w/pt he wanted to make sure letter was sent and he wanted a copy advised it appeared as though it had been sent and that his medical records had been sent on 10/31,  I will mail him a copy Meredith Staggers, RN  May 06, 2010 4:42 PM

## 2010-07-30 NOTE — Assessment & Plan Note (Signed)
Summary: f44m   Visit Type:  3 months follow up Primary Provider:  Rodrigo Ran, M.D.  CC:  No complaints.  History of Present Illness: 45 yo WM with history of HTN, Hyperlipidemia, DM and CAD who underwent 4V CABG on October 16, 2009 after presentation with a NSTEMI - he had early graft closure within 6 months and required multivessel stenting after developing recurrent Class 3 angina.   He is doing the maintenance program of cardiac rehab and is tolerating this well. He goes MWF and does the stepper, bike, and treadmill. Also walks on the weekends. Overall he is doing well but complains of generalized fatigue. No exertional chest pain, edema, orthopnea, or PND. He reports compliance with his medications.   Current Medications (verified): 1)  Tylenol 325 Mg Tabs (Acetaminophen) .... As Needed 2)  Lisinopril 20 Mg Tabs (Lisinopril) .... Take One Tablet By Mouth Daily 3)  Metoprolol Tartrate 50 Mg Tabs (Metoprolol Tartrate) .... Take 1/2  Tablet By Mouth Twice A Day 4)  Vytorin 10-80 Mg Tabs (Ezetimibe-Simvastatin) .... Take One Tablet By Mouth Daily At Bedtime 5)  Aspirin 81 Mg Tbec (Aspirin) .... Take One Tablet By Mouth Daily 6)  Tegretol 200 Mg Tabs (Carbamazepine) .... 3 Tablets Two Times A Day 7)  Levetiracetam 500 Mg Tabs (Levetiracetam) .... Take 1/2 Tablet in The Morning and 1 Tablet in The Evening. 8)  Zoloft 100 Mg Tabs (Sertraline Hcl) .[name].. 1 1/2 Tablet At Bedtime 9)  One-A-Day Mens  Tabs (Multiple Vitamin) .... Take 1 Tablet By Mouth Once A Day 10)  Novolog Insulin Pump .... As Directed 11)  Oxigen 2 Litters .... At Bedtime 12)  Effient 10 Mg Tabs (Prasugrel Hcl) .... Take One Tablet By Mouth Daily  Allergies (verified): No Known Drug Allergies  Past History:  Past medical history reviewed for relevance to current acute and chronic problems.  Past Medical History:  Status post multivessel bypass grafting, October 16, 2009, for severe multivessel disease with unstable angina.     Positive non-ST-segment elevation myocardial infarction.   Type 1 diabetes on insulin pump at home.   Sleep apnea status post oral surgery.   Hyperlipidemia.   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  Diabetic retinopathy status post vitrectomy and history of retinal  surgery.   Hypertension Seizure disorder  Seasonal allergic rhinitis.  Depression  Review of Systems       Positive for blurry vision with upcoming cataract surgery, otherwise negative except as per HPI  Vital Signs:  Patient profile:   45 year old male Height:      72 inches Weight:      228.25 pounds BMI:     31.07 Pulse rate:   84 / minute Pulse rhythm:   regular Resp:     18 per minute BP sitting:   102 / 64  (left arm) Cuff size:   large  Vitals Entered By: Vikki Ports (July 16, 2010 11:47 AM)  Physical Exam  General:  Pt is alert and oriented, in no acute distress. 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 He is doing well in the cardiac rehab  maintenance program. Continue current medical program. He can have cataract surgery at low-risk but will need to continue with ASA and Effient perioperatively without drug interruption.  His updated medication list for this problem includes:    Lisinopril 20 Mg Tabs (Lisinopril) .[name]... Take one tablet by mouth daily    Metoprolol Tartrate 50 Mg Tabs (Metoprolol tartrate) .[name]... Take 1/2  tablet by mouth twice a day    Aspirin 81 Mg Tbec (Aspirin) .[name]... Take one tablet by mouth daily    Effient 10 Mg Tabs (Prasugrel hcl) .[name]... Take one tablet by mouth daily  Problem # 2:   HYPERLIPIDEMIA-MIXED (ICD-272.4) Recent lipids reviewed and LDL is 73...low HDL is noted.  His updated medication list for this problem includes:    Vytorin 10-80 Mg Tabs (Ezetimibe-simvastatin) .[name]... Take one tablet by mouth daily at bedtime  Patient Instructions: 1)  Your physician recommends that you continue on your current medications as directed. Please refer to the Current Medication list given to you today. 2)  Your physician wants you to follow-up in: 4 MONTHS.  You will receive a reminder letter in the mail two months in advance. If you don't receive a letter, please call our office to schedule the follow-up appointment.

## 2010-07-30 NOTE — Progress Notes (Signed)
  Walk in Patient Form Recieved " Pt left letter for Excell Seltzer" Sent to [employer]  March 25, 2010 8:40 AM

## 2010-07-30 NOTE — Miscellaneous (Signed)
Summary: [facility] Progress Note   [facility] Progress Note   Imported By: Roderic Ovens 03/09/2010 13:13:42  _____________________________________________________________________  External Attachment:    Type:   Image     Comment:   External Document

## 2010-07-30 NOTE — Assessment & Plan Note (Signed)
Summary: chest tightness, low bp/lwb   Visit Type:  Follow-up Primary Provider:  Rodrigo Ran, M.D.  CC:  Chest pains- chest tightness- low blood pressure.  History of Present Illness: 45 yo WM with history of HTN, Hyperlipidemia, DM and CAD who underwent 4V CABG on October 16, 2009 after presentation with a NSTEMI who is followed by Dr. Excell Seltzer and is added on to my schedule today for evaluation of chest pain. He tells me that last week in cardiac rehab he had two different episodes of severe substernal chest pressure with nausea while at peak exercise. He was told not to exercise until seen by cardiology. Five nights ago, he had recurrent chest discomfort while at rest at home. He spoke to Dr. Therisa Doyne office and CT surgery was called. He was told to go into the ED if his pain returned. Over the last five days, he has done very little but no recurrence of chest pain. His overall energy level has been down with daytime fatigue and sleepiness.   Current Medications (verified): 1)  Tylenol 325 Mg Tabs (Acetaminophen) .... As Needed 2)  Lisinopril 20 Mg Tabs (Lisinopril) .... Take One Tablet By Mouth Daily 3)  Metoprolol Tartrate 50 Mg Tabs (Metoprolol Tartrate) .... Take 1 Tablet Twice A Day 4)  Vytorin 10-80 Mg Tabs (Ezetimibe-Simvastatin) .... Take One Tablet By Mouth Daily At Bedtime 5)  Aspirin 81 Mg Tbec (Aspirin) .... Take One Tablet By Mouth Daily 6)  Tegretol Xr 400 Mg Xr12h-Tab (Carbamazepine) .... Take 2 Tablets Two Times A Day 7)  Tegretol 200 Mg Tabs (Carbamazepine) .... Take 1 Tablet By Mouth Two Times A Day 8)  Levetiracetam 500 Mg Tabs (Levetiracetam) .... Take 1/2 Tablet in The Morning and 1 Tablet in The Evening. 9)  Zoloft 100 Mg Tabs (Sertraline Hcl) .... Take 1 Tablet By Mouth Once A Day 10)  One-A-Day Mens  Tabs (Multiple Vitamin) .... Take 1 Tablet By Mouth Once A Day 11)  Tramadol Hcl 50 Mg Tabs (Tramadol Hcl) .... As Needed 12)  Novolog Insulin Pump .... As Directed 13)   Oxigen 2 Litters .... At Bedtime  Allergies (verified): No Known Drug Allergies  Past History:  Past Medical History: Reviewed history from 11/28/2009 and no changes required.  Status post multivessel bypass grafting, October 16, 2009, for severe   multivessel disease with unstable angina.   Positive non-ST-segment elevation myocardial infarction.   Type 1 diabetes on insulin pump at home.   Sleep apnea status post oral surgery.   Hyperlipidemia.   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  Diabetic retinopathy status post vitrectomy and history of retinal  surgery.   Hypertension Seizure disorder  Seasonal allergic rhinitis.  Depression  Past Surgical History: Reviewed history from 11/27/2009 and no changes required.  multivessel bypass grafting, October 16, 2009  uvulopalatoplasty surgery.  Tonsillectomy.   Release of left transverse carpal ligament.   Right eye vitrectomy and detached retina repair, May 2009.      Family History: Reviewed history from 11/27/2009 and no changes required.  His father died from coronary artery disease, diabetes   mellitus, type 2 and hypertension.  His mother has Crohn disease.  A  brother died from squamous cell carcinoma.  A sister has had some kind  of arthritis.   Social History: Reviewed history from 11/27/2009 and no changes required.  He is married and lives with his wife, Almyra Free, in   [place].  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   He does not have any children.  He has no history of tobacco or alcohol  abuse.      Review of Systems       The patient complains of fatigue and chest pain.  The patient denies malaise, fever, weight gain/loss, vision loss, decreased hearing, hoarseness, palpitations, shortness of breath, prolonged cough, wheezing, sleep apnea, coughing up blood, abdominal pain, blood in stool, nausea, vomiting, diarrhea, heartburn, incontinence,  blood in urine, muscle weakness, joint pain, leg swelling, rash, skin lesions, headache, fainting, dizziness, depression, anxiety, enlarged lymph nodes, easy bruising or bleeding, and environmental allergies.    Vital Signs:  Patient profile:   45 year old male Height:      72 inches Weight:      226.50 pounds BMI:     30.83 Pulse rate:   75 / minute Pulse rhythm:   regular Resp:     18 per minute BP sitting:   90 / 60  (left arm) Cuff size:   large  Vitals Entered By: Vikki Ports (March 04, 2010 11:58 AM)  Physical Exam  General:  General: Well developed, well nourished, NAD HEENT: OP clear, mucus membranes moist SKIN: warm, dry Neuro: No focal deficits Musculoskeletal: Muscle strength 5/5 all ext Psychiatric: Mood and affect normal Neck: No JVD, no carotid bruits, no thyromegaly, no lymphadenopathy. Lungs:Clear bilaterally, no wheezes, rhonci, crackles Chest wall with well healed mid-line surgical scar. No skin breakdown. No pain with palpation of chest wall.  CV: RRR no murmurs, gallops rubs Abdomen: soft, NT, ND, BS present Extremities: No edema, pulses 2+.    EKG  Procedure date:  03/04/2010  Findings:      NSR, rate 75 bpm. He has ST elevation in leads II, III and aVF with ST depression in leads I, aVL. This is unchanged from EKG in July. Likely repolarization abnormality.   Impression & Recommendations:  Problem # 1:  CORONARY ATHEROSLERO AUTOL VEIN BYPASS GRAFT (ICD-414.02)  Chest pain in this gentleman who is 5 months post bypass with long standing DM is concerning for angina. Will plan left heart cath tomorrow with Dr. Excell Seltzer in the outpatient cath lab at 11am. Discussed risks and benefits with the patient. He is willing to proceed with the procedure. I also discussed the case with Dr. Excell Seltzer. Will check BMET, CBC and coags today.  He will reduce his Metoprolol dose to 25 mg by mouth twice daily given his fatigue and relative hypotension.  His updated  medication list for this problem includes:    Lisinopril 20 Mg Tabs (Lisinopril) .[name]... Take one tablet by mouth daily    Metoprolol Tartrate 50 Mg Tabs (Metoprolol tartrate) .[name]... Take 1 tablet twice a day    Aspirin 81 Mg Tbec (Aspirin) .[name]... Take one tablet by mouth daily  Orders: EKG w/ Interpretation (93000) Cardiac Catheterization (Cardiac Cath) TLB-BMP (Basic Metabolic Panel-BMET) (80048-METABOL) TLB-CBC Platelet - w/Differential (85025-CBCD) TLB-PT (Protime) (85610-PTP)  His updated medication list for this problem includes:    Lisinopril 20 Mg Tabs (Lisinopril) .[name]... Take one tablet by mouth daily    Metoprolol Tartrate 50 Mg Tabs (Metoprolol tartrate) .[name]... Take 1 tablet twice a day    Aspirin 81 Mg Tbec (Aspirin) .[name]... Take one tablet by mouth daily  Patient Instructions: 1)  Your physician recommends that you schedule a follow-up appointment after cardiac catherization. 2)  Your physician recommends that you have  lab work today.  3)  Your physician recommends that you continue on your current medications as directed. Please refer to the Current Medication list given to you today. 4)  Your physician has requested that you have a cardiac catheterization.  Cardiac catheterization is used to diagnose and/or treat various heart conditions. Doctors may recommend this procedure for a number of different reasons. The most common reason is to evaluate chest pain. Chest pain can be a symptom of coronary artery disease (CAD), and cardiac catheterization can show whether plaque is narrowing or blocking your heart's arteries. This procedure is also used to evaluate the valves, as well as measure the blood flow and oxygen levels in different parts of your heart.  For further information please visit [email or web address]  Please follow instruction sheet, as given.

## 2010-07-30 NOTE — Cardiovascular Report (Signed)
Summary: Pre Cath Orders   Pre Cath Orders   Imported By: Roderic Ovens 03/10/2010 15:15:58  _____________________________________________________________________  External Attachment:    Type:   Image     Comment:   External Document

## 2010-07-30 NOTE — Letter (Signed)
Summary: [facility]   [facility]   Imported By: Roderic Ovens 01/10/2010 11:30:52  _____________________________________________________________________  External Attachment:    Type:   Image     Comment:   External Document

## 2010-07-30 NOTE — Letter (Signed)
Summary: Cardiac Catheterization Instructions- [facility]  [facility], Main Office  [address]   [place], [place] [address]   Phone: [phone]  Fax: [phone]     03/04/2010 MRN: [id number]  Jeff [name] [address] [place], [place]  [address]  Dear [name],   You are scheduled for a Cardiac Catheterization on 03/05/10 with Dr.Cooper  Please arrive to the 1st floor of the [facility] at [facility] at 10:00 am on the day of your procedure. Please do not arrive before 6:30 a.m. Call the Heart and Vascular Center at [phone] if you are unable to make your appointmnet. The Code to get into the parking garage under the building is 0020 Take the elevators to the 1st floor. You must have someone to drive you home. Someone must be with you for the first 24 hours after you arrive home. Please wear clothes that are easy to get on and off and wear slip-on shoes. Do not eat or drink after midnight except water with your medications that morning. Bring all your medications and current insurance cards with you.   __X_ Make sure you take your aspirin.  __x_ You may take ALL of your medications with water that morning.   Please check your glucose and montitor your insulin pump accordingly. The usual length of stay after your procedure is 2 to 3 hours. This can vary.  If you have any questions, please call the office at the number listed above.   Whitney Maeola Sarah RN

## 2010-07-30 NOTE — Letter (Signed)
[facility]   Imported By: Marylou Mccoy 02/04/2010 15:24:05  _____________________________________________________________________  External Attachment:    Type:   Image     Comment:   External Document

## 2010-07-30 NOTE — Progress Notes (Signed)
Summary: Chest discomfort  Phone Note Call from Patient Call back at Home Phone [phone]   Caller: Patient Reason for Call: Talk to Nurse Summary of Call: pt wife states her husband is still having chest pain and very sluggish. wants to know if he can be seen sooner. [phone] cell [phone] Initial call taken by: Edman Circle,  March 03, 2010 9:32 AM  Follow-up for Phone Call        I spoke with the pt's wife and the pt is still having problems with low BP and chest discomfort.  The pt was having chest pain on Friday night and they called the answering service for Dr Perini's office and Dr Perini's office had the cardiac surgeons paged.  Dr Dorris Fetch called the pt and told him to come into the ER if his pain worsened.  The pt's pain eventually eased off.  The pt is scheduled to see Dr Excell Seltzer on 03/09/10 which is the next day he is in the office.  Due to the pt continuing to have problems I have arranged for him to see Dr Clifton James (DOD) on 03/04/10. The pt's wife will call our office if the pt has any other problems prior to appt.    Follow-up by: Julieta Gutting, RN, BSN,  March 03, 2010 10:21 AM

## 2010-07-30 NOTE — Assessment & Plan Note (Signed)
Summary: F1M   Visit Type:  1 month follow up Primary Provider:  Rodrigo Ran, M.D.  CC:  chest pain- patient is been taking Vytorin 10/40 since last Tuesday because he ran out of 10/80.  History of Present Illness: 45 year-old male presenting for follow-up evaluation after sustaining a NSTEMI and requiring multivessel CABG in April 2011. He has a nonhealing wound at the upper portion of his sternotomy, but this has recently healed.   He is walking about one mile per day. He denies exertional dyspnea or chest pain. No edema or other complaints at present.   Current Medications (verified): 1)  Tylenol 325 Mg Tabs (Acetaminophen) .... As Needed 2)  Lisinopril 20 Mg Tabs (Lisinopril) .... Take One Tablet By Mouth Daily 3)  Metoprolol Tartrate 50 Mg Tabs (Metoprolol Tartrate) .... Take 1 Tablet Twice A Day 4)  Vytorin 10-80 Mg Tabs (Ezetimibe-Simvastatin) .... Take One Tablet By Mouth Dailyat Bedtimer 5)  Aspirin 81 Mg Tbec (Aspirin) .... Take One Tablet By Mouth Daily 6)  Tegretol Xr 400 Mg Xr12h-Tab (Carbamazepine) .... Take 2 Tablets Two Times A Day 7)  Tegretol 200 Mg Tabs (Carbamazepine) .... Take 1 Tablet By Mouth Two Times A Day 8)  Levetiracetam 500 Mg Tabs (Levetiracetam) .... Take 1/2 Tablet in The Morning and 1 Tablet in The Evening. 9)  Zoloft 100 Mg Tabs (Sertraline Hcl) .... Take 1 Tablet By Mouth Once A Day 10)  Folic Acid 1 Mg Tabs (Folic Acid) .... Take 1 Tablet By Mouth Once A Day Ran Out 2 Days Ago 11)  One-A-Day Mens  Tabs (Multiple Vitamin) .... Take 1 Tablet By Mouth Once A Day 12)  Tramadol Hcl 50 Mg Tabs (Tramadol Hcl) .... As Needed 13)  Novolog Insulin Pum .... As Directed  Allergies (verified): No Known Drug Allergies  Past History:  Past medical history reviewed for relevance to current acute and chronic problems.  Past Medical History: Reviewed history from 11/28/2009 and no changes required.  Status post multivessel bypass grafting, October 16, 2009, for  severe   multivessel disease with unstable angina.   Positive non-ST-segment elevation myocardial infarction.   Type 1 diabetes on insulin pump at home.   Sleep apnea status post oral surgery.   Hyperlipidemia.   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  Diabetic retinopathy status post vitrectomy and history of retinal  surgery.   Hypertension Seizure disorder  Seasonal allergic rhinitis.  Depression  Review of Systems       Negative except as per HPI   Vital Signs:  Patient profile:   45 year old male Height:      72 inches Weight:      222 pounds BMI:     30.22 Pulse rate:   86 / minute Pulse rhythm:   regular Resp:     18 per minute BP sitting:   106 / 60  (left arm) Cuff size:   large  Vitals Entered By: Vikki Ports (December 26, 2009 3:51 PM)  Physical Exam  General:  Pt is alert and oriented, in no acute distress. HEENT: normal Neck: normal carotid upstrokes without bruits, JVP normal Lungs: CTA Chest: sternotomy incision appears healed without tenderness or erythema CV: RRR without murmur or gallop Abd: soft, NT, positive BS, no bruit, no organomegaly Ext: no clubbing, cyanosis, or edema. peripheral pulses 2+ and equal Skin: warm and dry without rash    EKG  Procedure date:  12/26/2009  Findings:      NSR 86 bpm, rightward axis, mild inferior ST elevation unchanged form previous tracing.  Echocardiogram  Procedure date:  12/16/2009  Findings:      Left ventricle: The cavity size was normal. Wall thickness was       normal. Systolic function was normal. The estimated ejection       fraction was in the range of 60% to 65%. Wall motion was normal;       there were no regional wall motion abnormalities. Features are       consistent with a pseudonormal left ventricular filling pattern,       with concomitant abnormal relaxation and increased filling       pressure (grade 2 diastolic dysfunction).      - Left atrium: The atrium was mildly dilated.  Impression & Recommendations:  Problem # 1:  CORONARY ATHEROSLERO AUTOL VEIN BYPASS GRAFT (ICD-414.02) Stable without angina. Sternotomy incision healing well. Continue current Rx. Pt ready to start cardiac rehab. 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 I advised this is fine.  His updated medication list for this problem includes:    Lisinopril 20 Mg Tabs (Lisinopril) .[name]... Take one tablet by mouth daily    Metoprolol Tartrate 50 Mg Tabs (Metoprolol tartrate) .[name]... Take 1 tablet twice a day    Aspirin 81 Mg Tbec (Aspirin) .[name]... Take one tablet by mouth daily  Orders: EKG w/ Interpretation (93000)  Problem # 2:  HYPERLIPIDEMIA-MIXED (ICD-272.4) Reviewed lipids from Dr Perini's ofc. Chol 127, Trig 84, HDL 41, LDL 69. Lipids at goal. LFT's normal. No myalgias. Continue current Rx as per Dr Laurey Morale instructions.  His updated medication list for this problem includes:    Vytorin 10-80 Mg Tabs (Ezetimibe-simvastatin) .[name]... Take one tablet by mouth daily at bedtime  Orders: EKG w/ Interpretation (93000)  Patient Instructions: 1)  Your physician wants you to follow-up in:   3 MONTHS. You will receive a reminder letter in the mail two months in advance. If you don't receive a letter, please call our office to schedule the follow-up appointment. 2)  Your physician recommends that you continue on your current medications as directed. Please refer to the Current Medication list given to you today. Prescriptions: VYTORIN 10-80 MG TABS (EZETIMIBE-SIMVASTATIN) Take one tablet by mouth daily at bedtime  #30 x 6   Entered by:   Julieta Gutting, RN, BSN   Authorized by:   Norva Karvonen, MD   Signed by:   Julieta Gutting, RN, BSN on 12/26/2009   Method used:   Electronically to        [facility]. [phone]* (retail)       [phone] [address].       [place], [place]  [address]       Ph: [phone]        Fax: [phone]   RxID:   [id number] LISINOPRIL 20 MG TABS (LISINOPRIL) Take one tablet by mouth daily  #30 x 6   Entered by:   Julieta Gutting, RN, BSN   Authorized by:   Norva Karvonen, MD   Signed by:   Julieta Gutting, RN, BSN on 12/26/2009   Method used:   Electronically to        [facility]. [phone]* (retail)       [phone] [address] [facility].       [facility]  [place], [place]  [address]       Ph: [phone]       Fax: [phone]   RxID:   [id number]

## 2010-07-30 NOTE — Progress Notes (Signed)
  DDS request recieved sent to [facility]  March 26, 2010 1:18 PM

## 2010-07-30 NOTE — Letter (Signed)
Summary: [facility] Maintenance Program  Cardiac Rehab Maintenance Program   Imported By: Marylou Mccoy 05/19/2010 15:38:40  _____________________________________________________________________  External Attachment:    Type:   Image     Comment:   External Document

## 2010-07-31 ENCOUNTER — Encounter (HOSPITAL_COMMUNITY): Payer: Self-pay

## 2010-07-31 ENCOUNTER — Telehealth: Payer: Self-pay | Admitting: Cardiovascular Disease

## 2010-08-03 ENCOUNTER — Encounter (HOSPITAL_COMMUNITY): Payer: Self-pay

## 2010-08-05 ENCOUNTER — Encounter (HOSPITAL_COMMUNITY): Payer: Self-pay
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
## 2010-08-05 NOTE — Progress Notes (Signed)
  DDS Request received sent to [facility]  July 30, 2010 1:54 PM

## 2010-08-07 ENCOUNTER — Encounter (HOSPITAL_COMMUNITY): Payer: Self-pay

## 2010-08-10 ENCOUNTER — Encounter (HOSPITAL_COMMUNITY): Payer: Self-pay

## 2010-08-12 ENCOUNTER — Encounter (HOSPITAL_COMMUNITY): Payer: Self-pay

## 2010-08-14 ENCOUNTER — Encounter (HOSPITAL_COMMUNITY): Payer: Self-pay

## 2010-08-17 ENCOUNTER — Encounter (HOSPITAL_COMMUNITY): Payer: Self-pay

## 2010-08-18 ENCOUNTER — Encounter (HOSPITAL_COMMUNITY): Payer: BC Managed Care – PPO | Attending: Cardiovascular Disease

## 2010-08-19 ENCOUNTER — Encounter (HOSPITAL_COMMUNITY): Payer: Self-pay

## 2010-08-19 NOTE — Miscellaneous (Signed)
Summary: [facility]   [facility]   Imported By: Roderic Ovens 08/12/2010 14:13:21  _____________________________________________________________________  External Attachment:    Type:   Image     Comment:   External Document

## 2010-08-19 NOTE — Miscellaneous (Signed)
Summary: [facility]    Cardiac Progress Note   Imported By: Roderic Ovens 08/12/2010 14:12:30  _____________________________________________________________________  External Attachment:    Type:   Image     Comment:   External Document

## 2010-08-20 ENCOUNTER — Encounter (HOSPITAL_COMMUNITY): Payer: BC Managed Care – PPO | Attending: Cardiovascular Disease

## 2010-08-21 ENCOUNTER — Encounter (HOSPITAL_COMMUNITY): Payer: Self-pay

## 2010-08-24 ENCOUNTER — Encounter (HOSPITAL_COMMUNITY): Payer: Self-pay

## 2010-08-26 ENCOUNTER — Encounter (HOSPITAL_COMMUNITY): Payer: Self-pay

## 2010-08-27 ENCOUNTER — Encounter (HOSPITAL_COMMUNITY): Payer: BC Managed Care – PPO | Attending: Cardiovascular Disease

## 2010-08-27 DIAGNOSIS — Z5189 Encounter for other specified aftercare: Secondary | ICD-10-CM | POA: Insufficient documentation

## 2010-08-27 DIAGNOSIS — Z951 Presence of aortocoronary bypass graft: Secondary | ICD-10-CM | POA: Insufficient documentation

## 2010-08-27 DIAGNOSIS — I252 Old myocardial infarction: Secondary | ICD-10-CM | POA: Insufficient documentation

## 2010-08-27 DIAGNOSIS — E119 Type 2 diabetes mellitus without complications: Secondary | ICD-10-CM | POA: Insufficient documentation

## 2010-08-27 DIAGNOSIS — I2510 Atherosclerotic heart disease of native coronary artery without angina pectoris: Secondary | ICD-10-CM | POA: Insufficient documentation

## 2010-08-27 DIAGNOSIS — I498 Other specified cardiac arrhythmias: Secondary | ICD-10-CM | POA: Insufficient documentation

## 2010-08-27 DIAGNOSIS — I319 Disease of pericardium, unspecified: Secondary | ICD-10-CM | POA: Insufficient documentation

## 2010-08-28 ENCOUNTER — Encounter (HOSPITAL_COMMUNITY): Payer: Self-pay | Attending: Cardiovascular Disease

## 2010-08-28 DIAGNOSIS — I319 Disease of pericardium, unspecified: Secondary | ICD-10-CM | POA: Insufficient documentation

## 2010-08-28 DIAGNOSIS — F341 Dysthymic disorder: Secondary | ICD-10-CM | POA: Insufficient documentation

## 2010-08-28 DIAGNOSIS — Q8500 Neurofibromatosis, unspecified: Secondary | ICD-10-CM | POA: Insufficient documentation

## 2010-08-28 DIAGNOSIS — Z5189 Encounter for other specified aftercare: Secondary | ICD-10-CM | POA: Insufficient documentation

## 2010-08-28 DIAGNOSIS — Z794 Long term (current) use of insulin: Secondary | ICD-10-CM | POA: Insufficient documentation

## 2010-08-28 DIAGNOSIS — E109 Type 1 diabetes mellitus without complications: Secondary | ICD-10-CM | POA: Insufficient documentation

## 2010-08-28 DIAGNOSIS — I498 Other specified cardiac arrhythmias: Secondary | ICD-10-CM | POA: Insufficient documentation

## 2010-08-28 DIAGNOSIS — G4733 Obstructive sleep apnea (adult) (pediatric): Secondary | ICD-10-CM | POA: Insufficient documentation

## 2010-08-28 DIAGNOSIS — I2510 Atherosclerotic heart disease of native coronary artery without angina pectoris: Secondary | ICD-10-CM | POA: Insufficient documentation

## 2010-08-28 DIAGNOSIS — I10 Essential (primary) hypertension: Secondary | ICD-10-CM | POA: Insufficient documentation

## 2010-08-28 DIAGNOSIS — Z951 Presence of aortocoronary bypass graft: Secondary | ICD-10-CM | POA: Insufficient documentation

## 2010-08-28 DIAGNOSIS — E785 Hyperlipidemia, unspecified: Secondary | ICD-10-CM | POA: Insufficient documentation

## 2010-08-28 DIAGNOSIS — Z9641 Presence of insulin pump (external) (internal): Secondary | ICD-10-CM | POA: Insufficient documentation

## 2010-08-28 DIAGNOSIS — I252 Old myocardial infarction: Secondary | ICD-10-CM | POA: Insufficient documentation

## 2010-08-28 DIAGNOSIS — Z79899 Other long term (current) drug therapy: Secondary | ICD-10-CM | POA: Insufficient documentation

## 2010-08-31 ENCOUNTER — Encounter (HOSPITAL_COMMUNITY): Payer: Self-pay

## 2010-09-02 ENCOUNTER — Encounter (HOSPITAL_COMMUNITY): Payer: Self-pay

## 2010-09-04 ENCOUNTER — Encounter (HOSPITAL_COMMUNITY): Payer: Self-pay

## 2010-09-07 ENCOUNTER — Ambulatory Visit (INDEPENDENT_AMBULATORY_CARE_PROVIDER_SITE_OTHER): Payer: BC Managed Care – PPO | Admitting: Gastroenterology

## 2010-09-07 ENCOUNTER — Encounter (HOSPITAL_COMMUNITY): Payer: Self-pay

## 2010-09-07 ENCOUNTER — Encounter: Payer: Self-pay | Admitting: Gastroenterology

## 2010-09-07 DIAGNOSIS — K625 Hemorrhage of anus and rectum: Secondary | ICD-10-CM

## 2010-09-08 LAB — GLUCOSE, CAPILLARY
Glucose-Capillary: 146 mg/dL — ABNORMAL HIGH (ref 70–99)
Glucose-Capillary: 154 mg/dL — ABNORMAL HIGH (ref 70–99)
Glucose-Capillary: 243 mg/dL — ABNORMAL HIGH (ref 70–99)
Glucose-Capillary: 285 mg/dL — ABNORMAL HIGH (ref 70–99)
Glucose-Capillary: 328 mg/dL — ABNORMAL HIGH (ref 70–99)
Glucose-Capillary: 360 mg/dL — ABNORMAL HIGH (ref 70–99)

## 2010-09-08 NOTE — Procedures (Signed)
Summary: Colon   Colonoscopy  Procedure date:  07/25/2007  Findings:      Location:  [facility].   Patient Name: Jeff [name], Jeff [name] MRN:  Procedure Procedures: Colonoscopy CPT: [phone].  Personnel: Endoscopist: [name], MD.  Referred By: Rodrigo Ran, MD.  Exam Location: Exam performed in Endoscopy Suite. Outpatient  Patient Consent: Procedure, Alternatives, Risks and Benefits discussed, consent obtained, from patient. Consent was obtained by the RN.  Indications Symptoms: Hematochezia.  Comments: not anemic History  Current Medications: Patient is not currently taking Coumadin.  Comments: Patient history reviewed and updated, pre-procedure physical performed prior to initiation of sedation? yes Pre-Exam Physical: Performed Jul 25, 2007. Cardio-pulmonary exam, Abdominal exam, Mental status exam WNL.  Exam Exam: Extent of exam reached: Cecum, extent intended: Cecum.  The cecum was identified by appendiceal orifice and IC valve. Patient position: on left side. Time to Cecum: 00:02:22. Time for Withdrawl: 00:06:56. Colon retroflexion performed. Images taken. ASA Classification: II. Tolerance: good.  Monitoring: Pulse and BP monitoring, Oximetry used. Supplemental O2 given.  Colon Prep Prep results: fair, adequate exam.  Sedation Meds: Patient assessed and found to be appropriate for moderate (conscious) sedation. Fentanyl 75 mcg. given IV. Versed 8 mg. given IV.  Findings - NORMAL EXAM: Cecum to Rectum.   Assessment Normal examination.  Comments: NO COLON POLYPS OR CANCERS.  ALTHOUGH THERE WERE NONE ON EXAMINATION TODAY, I SUSPECT HE HAS INTERMITTENT HEMORRHOIDS THAT PERIODICALLY BLEED DURING BM.  HE WAS NOT ANEMIC ON LABS AND SO IT IS REASONABLE TO SIMPLY FOLLOW HIM CLINICALLY. Events  Unplanned Interventions: No intervention was required.  Unplanned Events: There were no complications. Plans Comments: HE SHOULD CONTINUE TO FOLLOW  COLORECTAL CANCER SCREENING GUIDELINES WITH A REPEAT COLONOSCOPY IN 10 YEARS.

## 2010-09-09 ENCOUNTER — Encounter (HOSPITAL_COMMUNITY): Payer: Self-pay

## 2010-09-09 LAB — GLUCOSE, CAPILLARY
Glucose-Capillary: 106 mg/dL — ABNORMAL HIGH (ref 70–99)
Glucose-Capillary: 119 mg/dL — ABNORMAL HIGH (ref 70–99)
Glucose-Capillary: 121 mg/dL — ABNORMAL HIGH (ref 70–99)
Glucose-Capillary: 151 mg/dL — ABNORMAL HIGH (ref 70–99)
Glucose-Capillary: 166 mg/dL — ABNORMAL HIGH (ref 70–99)
Glucose-Capillary: 190 mg/dL — ABNORMAL HIGH (ref 70–99)
Glucose-Capillary: 201 mg/dL — ABNORMAL HIGH (ref 70–99)
Glucose-Capillary: 214 mg/dL — ABNORMAL HIGH (ref 70–99)
Glucose-Capillary: 215 mg/dL — ABNORMAL HIGH (ref 70–99)
Glucose-Capillary: 234 mg/dL — ABNORMAL HIGH (ref 70–99)
Glucose-Capillary: 235 mg/dL — ABNORMAL HIGH (ref 70–99)
Glucose-Capillary: 236 mg/dL — ABNORMAL HIGH (ref 70–99)
Glucose-Capillary: 245 mg/dL — ABNORMAL HIGH (ref 70–99)
Glucose-Capillary: 277 mg/dL — ABNORMAL HIGH (ref 70–99)
Glucose-Capillary: 278 mg/dL — ABNORMAL HIGH (ref 70–99)
Glucose-Capillary: 280 mg/dL — ABNORMAL HIGH (ref 70–99)
Glucose-Capillary: 400 mg/dL — ABNORMAL HIGH (ref 70–99)
Glucose-Capillary: 41 mg/dL — CL (ref 70–99)
Glucose-Capillary: 75 mg/dL (ref 70–99)
Glucose-Capillary: 75 mg/dL (ref 70–99)

## 2010-09-09 LAB — CBC
HCT: 35.4 % — ABNORMAL LOW (ref 39.0–52.0)
HCT: 40.9 % (ref 39.0–52.0)
Hemoglobin: 11.9 g/dL — ABNORMAL LOW (ref 13.0–17.0)
Hemoglobin: 13.4 g/dL (ref 13.0–17.0)
Hemoglobin: 14.1 g/dL (ref 13.0–17.0)
MCH: 30.5 pg (ref 26.0–34.0)
MCH: 31 pg (ref 26.0–34.0)
MCH: 31.1 pg (ref 26.0–34.0)
MCHC: 34.5 g/dL (ref 30.0–36.0)
MCHC: 34.7 g/dL (ref 30.0–36.0)
MCHC: 34.9 g/dL (ref 30.0–36.0)
MCHC: 35 g/dL (ref 30.0–36.0)
MCV: 89.3 fL (ref 78.0–100.0)
MCV: 90.1 fL (ref 78.0–100.0)
RBC: 3.84 MIL/uL — ABNORMAL LOW (ref 4.22–5.81)
RDW: 12.7 % (ref 11.5–15.5)
RDW: 12.8 % (ref 11.5–15.5)
WBC: 4.8 10*3/uL (ref 4.0–10.5)

## 2010-09-09 LAB — POCT I-STAT, CHEM 8
Calcium, Ion: 1.05 mmol/L — ABNORMAL LOW (ref 1.12–1.32)
Chloride: 101 mEq/L (ref 96–112)
Glucose, Bld: 521 mg/dL — ABNORMAL HIGH (ref 70–99)
HCT: 42 % (ref 39.0–52.0)
TCO2: 22 mmol/L (ref 0–100)

## 2010-09-09 LAB — DIFFERENTIAL
Basophils Absolute: 0 10*3/uL (ref 0.0–0.1)
Basophils Relative: 1 % (ref 0–1)
Eosinophils Absolute: 0.1 10*3/uL (ref 0.0–0.7)
Eosinophils Absolute: 0.1 10*3/uL (ref 0.0–0.7)
Eosinophils Relative: 1 % (ref 0–5)
Eosinophils Relative: 2 % (ref 0–5)
Eosinophils Relative: 3 % (ref 0–5)
Lymphocytes Relative: 15 % (ref 12–46)
Lymphs Abs: 1 10*3/uL (ref 0.7–4.0)
Lymphs Abs: 1 10*3/uL (ref 0.7–4.0)
Monocytes Absolute: 0.9 10*3/uL (ref 0.1–1.0)
Monocytes Relative: 15 % — ABNORMAL HIGH (ref 3–12)
Monocytes Relative: 16 % — ABNORMAL HIGH (ref 3–12)
Neutro Abs: 3.6 10*3/uL (ref 1.7–7.7)
Neutro Abs: 4.3 10*3/uL (ref 1.7–7.7)
Neutrophils Relative %: 68 % (ref 43–77)

## 2010-09-09 LAB — KETONES, QUALITATIVE

## 2010-09-09 LAB — POCT CARDIAC MARKERS
CKMB, poc: <1 ng/mL — ABNORMAL LOW (ref 1.0–8.0)
Myoglobin, poc: 43 ng/mL (ref 12–200)
Troponin i, poc: <0.05 ng/mL (ref 0.00–0.09)

## 2010-09-09 LAB — BASIC METABOLIC PANEL
BUN: 16 mg/dL (ref 6–23)
BUN: 22 mg/dL (ref 6–23)
Calcium: 8.6 mg/dL (ref 8.4–10.5)
Chloride: 104 mEq/L (ref 96–112)
GFR calc Af Amer: >60 mL/min (ref >60)
GFR calc non Af Amer: >60 mL/min (ref >60)
GFR calc non Af Amer: >60 mL/min (ref >60)
Glucose, Bld: 244 mg/dL — ABNORMAL HIGH (ref 70–99)
Glucose, Bld: 76 mg/dL (ref 70–99)
Potassium: 4 mEq/L (ref 3.5–5.1)
Potassium: 4.2 mEq/L (ref 3.5–5.1)
Potassium: 4.2 mEq/L (ref 3.5–5.1)
Sodium: 132 mEq/L — ABNORMAL LOW (ref 135–145)
Sodium: 137 mEq/L (ref 135–145)
Sodium: 140 mEq/L (ref 135–145)

## 2010-09-09 LAB — HEPARIN LEVEL (UNFRACTIONATED): Heparin Unfractionated: 0.62 IU/mL (ref 0.30–0.70)

## 2010-09-09 LAB — CARDIAC PANEL(CRET KIN+CKTOT+MB+TROPI)
CK, MB: 5.4 ng/mL — ABNORMAL HIGH (ref 0.3–4.0)
CK, MB: 7.8 ng/mL (ref 0.3–4.0)
Relative Index: INVALID (ref 0.0–2.5)
Total CK: 84 U/L (ref 7–232)
Total CK: 92 U/L (ref 7–232)
Troponin I: 0.95 ng/mL (ref 0.00–0.06)

## 2010-09-09 LAB — MAGNESIUM
Magnesium: 1.9 mg/dL (ref 1.5–2.5)
Magnesium: 2 mg/dL (ref 1.5–2.5)

## 2010-09-09 LAB — COMPREHENSIVE METABOLIC PANEL
ALT: 27 U/L (ref 0–53)
AST: 31 U/L (ref 0–37)
Alkaline Phosphatase: 74 U/L (ref 39–117)
BUN: 11 mg/dL (ref 6–23)
CO2: 25 mEq/L (ref 19–32)
CO2: 26 mEq/L (ref 19–32)
Calcium: 8.2 mg/dL — ABNORMAL LOW (ref 8.4–10.5)
Calcium: 8.8 mg/dL (ref 8.4–10.5)
Chloride: 104 mEq/L (ref 96–112)
Creatinine, Ser: 0.68 mg/dL (ref 0.4–1.5)
GFR calc Af Amer: >60 mL/min (ref >60)
GFR calc Af Amer: >60 mL/min (ref >60)
GFR calc non Af Amer: >60 mL/min (ref >60)
GFR calc non Af Amer: >60 mL/min (ref >60)
Glucose, Bld: 135 mg/dL — ABNORMAL HIGH (ref 70–99)
Potassium: 4.1 mEq/L (ref 3.5–5.1)
Sodium: 138 mEq/L (ref 135–145)
Total Bilirubin: 0.5 mg/dL (ref 0.3–1.2)
Total Protein: 6.4 g/dL (ref 6.0–8.3)

## 2010-09-09 LAB — PHOSPHORUS: Phosphorus: 3.5 mg/dL (ref 2.3–4.6)

## 2010-09-09 LAB — CK TOTAL AND CKMB (NOT AT ARMC): Relative Index: INVALID (ref 0.0–2.5)

## 2010-09-09 LAB — LIPID PANEL
Total CHOL/HDL Ratio: 3.2 RATIO
VLDL: 31 mg/dL (ref 0–40)

## 2010-09-09 LAB — POTASSIUM: Potassium: 5.8 mEq/L — ABNORMAL HIGH (ref 3.5–5.1)

## 2010-09-09 LAB — PROTIME-INR: INR: 1.16 (ref 0.00–1.49)

## 2010-09-10 LAB — GLUCOSE, CAPILLARY
Glucose-Capillary: 102 mg/dL — ABNORMAL HIGH (ref 70–99)
Glucose-Capillary: 141 mg/dL — ABNORMAL HIGH (ref 70–99)
Glucose-Capillary: 146 mg/dL — ABNORMAL HIGH (ref 70–99)
Glucose-Capillary: 160 mg/dL — ABNORMAL HIGH (ref 70–99)
Glucose-Capillary: 180 mg/dL — ABNORMAL HIGH (ref 70–99)
Glucose-Capillary: 183 mg/dL — ABNORMAL HIGH (ref 70–99)
Glucose-Capillary: 188 mg/dL — ABNORMAL HIGH (ref 70–99)
Glucose-Capillary: 191 mg/dL — ABNORMAL HIGH (ref 70–99)
Glucose-Capillary: 199 mg/dL — ABNORMAL HIGH (ref 70–99)
Glucose-Capillary: 226 mg/dL — ABNORMAL HIGH (ref 70–99)
Glucose-Capillary: 231 mg/dL — ABNORMAL HIGH (ref 70–99)
Glucose-Capillary: 233 mg/dL — ABNORMAL HIGH (ref 70–99)
Glucose-Capillary: 236 mg/dL — ABNORMAL HIGH (ref 70–99)
Glucose-Capillary: 237 mg/dL — ABNORMAL HIGH (ref 70–99)
Glucose-Capillary: 275 mg/dL — ABNORMAL HIGH (ref 70–99)
Glucose-Capillary: 281 mg/dL — ABNORMAL HIGH (ref 70–99)
Glucose-Capillary: 71 mg/dL (ref 70–99)
Glucose-Capillary: 73 mg/dL (ref 70–99)
Glucose-Capillary: 94 mg/dL (ref 70–99)

## 2010-09-10 LAB — CBC
HCT: 41 % (ref 39.0–52.0)
MCH: 29.7 pg (ref 26.0–34.0)
MCH: 30 pg (ref 26.0–34.0)
MCV: 86.3 fL (ref 78.0–100.0)
MCV: 87.6 fL (ref 78.0–100.0)
Platelets: 183 10*3/uL (ref 150–400)
Platelets: 197 10*3/uL (ref 150–400)
RBC: 4.6 MIL/uL (ref 4.22–5.81)
RDW: 14.3 % (ref 11.5–15.5)
RDW: 14.4 % (ref 11.5–15.5)

## 2010-09-10 LAB — BASIC METABOLIC PANEL
BUN: 8 mg/dL (ref 6–23)
BUN: 8 mg/dL (ref 6–23)
CO2: 26 mEq/L (ref 19–32)
CO2: 27 mEq/L (ref 19–32)
Calcium: 8.8 mg/dL (ref 8.4–10.5)
Chloride: 102 mEq/L (ref 96–112)
Creatinine, Ser: 0.71 mg/dL (ref 0.4–1.5)
Creatinine, Ser: 0.75 mg/dL (ref 0.4–1.5)
GFR calc Af Amer: >60 mL/min (ref >60)

## 2010-09-10 LAB — POCT I-STAT GLUCOSE
Glucose, Bld: 210 mg/dL — ABNORMAL HIGH (ref 70–99)
Operator id: 221371

## 2010-09-11 ENCOUNTER — Encounter (HOSPITAL_COMMUNITY): Payer: Self-pay

## 2010-09-11 LAB — GLUCOSE, CAPILLARY
Glucose-Capillary: 124 mg/dL — ABNORMAL HIGH (ref 70–99)
Glucose-Capillary: 153 mg/dL — ABNORMAL HIGH (ref 70–99)
Glucose-Capillary: 157 mg/dL — ABNORMAL HIGH (ref 70–99)
Glucose-Capillary: 158 mg/dL — ABNORMAL HIGH (ref 70–99)
Glucose-Capillary: 161 mg/dL — ABNORMAL HIGH (ref 70–99)
Glucose-Capillary: 176 mg/dL — ABNORMAL HIGH (ref 70–99)
Glucose-Capillary: 178 mg/dL — ABNORMAL HIGH (ref 70–99)
Glucose-Capillary: 187 mg/dL — ABNORMAL HIGH (ref 70–99)
Glucose-Capillary: 199 mg/dL — ABNORMAL HIGH (ref 70–99)
Glucose-Capillary: 226 mg/dL — ABNORMAL HIGH (ref 70–99)
Glucose-Capillary: 241 mg/dL — ABNORMAL HIGH (ref 70–99)
Glucose-Capillary: 273 mg/dL — ABNORMAL HIGH (ref 70–99)
Glucose-Capillary: 326 mg/dL — ABNORMAL HIGH (ref 70–99)

## 2010-09-12 LAB — GLUCOSE, CAPILLARY
Glucose-Capillary: 219 mg/dL — ABNORMAL HIGH (ref 70–99)
Glucose-Capillary: 234 mg/dL — ABNORMAL HIGH (ref 70–99)
Glucose-Capillary: 265 mg/dL — ABNORMAL HIGH (ref 70–99)
Glucose-Capillary: 271 mg/dL — ABNORMAL HIGH (ref 70–99)
Glucose-Capillary: 308 mg/dL — ABNORMAL HIGH (ref 70–99)
Glucose-Capillary: 310 mg/dL — ABNORMAL HIGH (ref 70–99)

## 2010-09-13 LAB — GLUCOSE, CAPILLARY
Glucose-Capillary: 266 mg/dL — ABNORMAL HIGH (ref 70–99)
Glucose-Capillary: 315 mg/dL — ABNORMAL HIGH (ref 70–99)
Glucose-Capillary: 321 mg/dL — ABNORMAL HIGH (ref 70–99)
Glucose-Capillary: 323 mg/dL — ABNORMAL HIGH (ref 70–99)

## 2010-09-14 ENCOUNTER — Encounter (HOSPITAL_COMMUNITY): Payer: Self-pay

## 2010-09-15 LAB — CBC
HCT: 22.3 % — ABNORMAL LOW (ref 39.0–52.0)
HCT: 22.6 % — ABNORMAL LOW (ref 39.0–52.0)
HCT: 22.6 % — ABNORMAL LOW (ref 39.0–52.0)
HCT: 23.2 % — ABNORMAL LOW (ref 39.0–52.0)
HCT: 23.6 % — ABNORMAL LOW (ref 39.0–52.0)
HCT: 24.9 % — ABNORMAL LOW (ref 39.0–52.0)
HCT: 26.2 % — ABNORMAL LOW (ref 39.0–52.0)
HCT: 27.3 % — ABNORMAL LOW (ref 39.0–52.0)
HCT: 28.1 % — ABNORMAL LOW (ref 39.0–52.0)
HCT: 38.3 % — ABNORMAL LOW (ref 39.0–52.0)
HCT: 38.7 % — ABNORMAL LOW (ref 39.0–52.0)
HCT: 39.2 % (ref 39.0–52.0)
HCT: 40 % (ref 39.0–52.0)
HCT: 41.5 % (ref 39.0–52.0)
Hemoglobin: 13.2 g/dL (ref 13.0–17.0)
Hemoglobin: 13.4 g/dL (ref 13.0–17.0)
Hemoglobin: 13.8 g/dL (ref 13.0–17.0)
Hemoglobin: 14.3 g/dL (ref 13.0–17.0)
Hemoglobin: 14.6 g/dL (ref 13.0–17.0)
Hemoglobin: 7.8 g/dL — ABNORMAL LOW (ref 13.0–17.0)
Hemoglobin: 7.9 g/dL — ABNORMAL LOW (ref 13.0–17.0)
Hemoglobin: 8.1 g/dL — ABNORMAL LOW (ref 13.0–17.0)
Hemoglobin: 8.1 g/dL — ABNORMAL LOW (ref 13.0–17.0)
Hemoglobin: 8.3 g/dL — ABNORMAL LOW (ref 13.0–17.0)
Hemoglobin: 8.9 g/dL — ABNORMAL LOW (ref 13.0–17.0)
Hemoglobin: 9.2 g/dL — ABNORMAL LOW (ref 13.0–17.0)
Hemoglobin: 9.8 g/dL — ABNORMAL LOW (ref 13.0–17.0)
Hemoglobin: 9.9 g/dL — ABNORMAL LOW (ref 13.0–17.0)
MCHC: 34.2 g/dL (ref 30.0–36.0)
MCHC: 34.6 g/dL (ref 30.0–36.0)
MCHC: 34.9 g/dL (ref 30.0–36.0)
MCHC: 35 g/dL (ref 30.0–36.0)
MCHC: 35 g/dL (ref 30.0–36.0)
MCHC: 35.2 g/dL (ref 30.0–36.0)
MCHC: 35.2 g/dL (ref 30.0–36.0)
MCHC: 35.2 g/dL (ref 30.0–36.0)
MCHC: 35.3 g/dL (ref 30.0–36.0)
MCHC: 35.3 g/dL (ref 30.0–36.0)
MCHC: 35.7 g/dL (ref 30.0–36.0)
MCHC: 35.8 g/dL (ref 30.0–36.0)
MCHC: 35.8 g/dL (ref 30.0–36.0)
MCHC: 35.9 g/dL (ref 30.0–36.0)
MCV: 90.4 fL (ref 78.0–100.0)
MCV: 90.6 fL (ref 78.0–100.0)
MCV: 90.8 fL (ref 78.0–100.0)
MCV: 90.8 fL (ref 78.0–100.0)
MCV: 91.4 fL (ref 78.0–100.0)
MCV: 91.6 fL (ref 78.0–100.0)
MCV: 91.7 fL (ref 78.0–100.0)
MCV: 91.8 fL (ref 78.0–100.0)
MCV: 92 fL (ref 78.0–100.0)
MCV: 92.2 fL (ref 78.0–100.0)
MCV: 92.3 fL (ref 78.0–100.0)
MCV: 92.5 fL (ref 78.0–100.0)
MCV: 92.5 fL (ref 78.0–100.0)
MCV: 93 fL (ref 78.0–100.0)
MCV: 93.3 fL (ref 78.0–100.0)
Platelets: 136 10*3/uL — ABNORMAL LOW (ref 150–400)
Platelets: 146 10*3/uL — ABNORMAL LOW (ref 150–400)
Platelets: 152 10*3/uL (ref 150–400)
Platelets: 153 10*3/uL (ref 150–400)
Platelets: 162 10*3/uL (ref 150–400)
Platelets: 173 10*3/uL (ref 150–400)
Platelets: 176 10*3/uL (ref 150–400)
Platelets: 182 10*3/uL (ref 150–400)
Platelets: 184 K/uL (ref 150–400)
Platelets: 197 K/uL (ref 150–400)
Platelets: 207 K/uL (ref 150–400)
Platelets: 217 10*3/uL (ref 150–400)
Platelets: 228 10*3/uL (ref 150–400)
Platelets: 263 10*3/uL (ref 150–400)
Platelets: 304 10*3/uL (ref 150–400)
RBC: 2.43 MIL/uL — ABNORMAL LOW (ref 4.22–5.81)
RBC: 2.45 MIL/uL — ABNORMAL LOW (ref 4.22–5.81)
RBC: 2.46 MIL/uL — ABNORMAL LOW (ref 4.22–5.81)
RBC: 2.6 MIL/uL — ABNORMAL LOW (ref 4.22–5.81)
RBC: 2.75 MIL/uL — ABNORMAL LOW (ref 4.22–5.81)
RBC: 2.82 MIL/uL — ABNORMAL LOW (ref 4.22–5.81)
RBC: 3.02 MIL/uL — ABNORMAL LOW (ref 4.22–5.81)
RBC: 3.02 MIL/uL — ABNORMAL LOW (ref 4.22–5.81)
RBC: 4.16 MIL/uL — ABNORMAL LOW (ref 4.22–5.81)
RBC: 4.18 MIL/uL — ABNORMAL LOW (ref 4.22–5.81)
RBC: 4.28 MIL/uL (ref 4.22–5.81)
RBC: 4.38 MIL/uL (ref 4.22–5.81)
RBC: 4.57 MIL/uL (ref 4.22–5.81)
RDW: 12.8 % (ref 11.5–15.5)
RDW: 12.8 % (ref 11.5–15.5)
RDW: 12.8 % (ref 11.5–15.5)
RDW: 12.8 % (ref 11.5–15.5)
RDW: 12.9 % (ref 11.5–15.5)
RDW: 12.9 % (ref 11.5–15.5)
RDW: 13.1 % (ref 11.5–15.5)
RDW: 13.2 % (ref 11.5–15.5)
RDW: 13.2 % (ref 11.5–15.5)
RDW: 13.2 % (ref 11.5–15.5)
RDW: 13.5 % (ref 11.5–15.5)
RDW: 13.5 % (ref 11.5–15.5)
RDW: 13.6 % (ref 11.5–15.5)
RDW: 13.7 % (ref 11.5–15.5)
RDW: 13.8 % (ref 11.5–15.5)
WBC: 11.1 10*3/uL — ABNORMAL HIGH (ref 4.0–10.5)
WBC: 11.5 10*3/uL — ABNORMAL HIGH (ref 4.0–10.5)
WBC: 4.8 10*3/uL (ref 4.0–10.5)
WBC: 5.5 10*3/uL (ref 4.0–10.5)
WBC: 5.6 K/uL (ref 4.0–10.5)
WBC: 5.8 K/uL (ref 4.0–10.5)
WBC: 5.9 10*3/uL (ref 4.0–10.5)
WBC: 6.3 K/uL (ref 4.0–10.5)
WBC: 7.9 10*3/uL (ref 4.0–10.5)
WBC: 8.2 10*3/uL (ref 4.0–10.5)
WBC: 9 10*3/uL (ref 4.0–10.5)
WBC: 9.1 10*3/uL (ref 4.0–10.5)
WBC: 9.4 10*3/uL (ref 4.0–10.5)
WBC: 9.5 10*3/uL (ref 4.0–10.5)

## 2010-09-15 LAB — POCT I-STAT 4, (NA,K, GLUC, HGB,HCT)
Glucose, Bld: 113 mg/dL — ABNORMAL HIGH (ref 70–99)
Glucose, Bld: 151 mg/dL — ABNORMAL HIGH (ref 70–99)
Glucose, Bld: 161 mg/dL — ABNORMAL HIGH (ref 70–99)
Glucose, Bld: 165 mg/dL — ABNORMAL HIGH (ref 70–99)
Glucose, Bld: 165 mg/dL — ABNORMAL HIGH (ref 70–99)
Glucose, Bld: 170 mg/dL — ABNORMAL HIGH (ref 70–99)
Glucose, Bld: 201 mg/dL — ABNORMAL HIGH (ref 70–99)
Glucose, Bld: 217 mg/dL — ABNORMAL HIGH (ref 70–99)
Glucose, Bld: 277 mg/dL — ABNORMAL HIGH (ref 70–99)
HCT: 24 % — ABNORMAL LOW (ref 39.0–52.0)
HCT: 24 % — ABNORMAL LOW (ref 39.0–52.0)
HCT: 24 % — ABNORMAL LOW (ref 39.0–52.0)
HCT: 25 % — ABNORMAL LOW (ref 39.0–52.0)
HCT: 26 % — ABNORMAL LOW (ref 39.0–52.0)
HCT: 26 % — ABNORMAL LOW (ref 39.0–52.0)
HCT: 37 % — ABNORMAL LOW (ref 39.0–52.0)
HCT: 38 % — ABNORMAL LOW (ref 39.0–52.0)
HCT: 39 % (ref 39.0–52.0)
Hemoglobin: 12.6 g/dL — ABNORMAL LOW (ref 13.0–17.0)
Hemoglobin: 12.9 g/dL — ABNORMAL LOW (ref 13.0–17.0)
Hemoglobin: 13.3 g/dL (ref 13.0–17.0)
Hemoglobin: 8.2 g/dL — ABNORMAL LOW (ref 13.0–17.0)
Hemoglobin: 8.2 g/dL — ABNORMAL LOW (ref 13.0–17.0)
Hemoglobin: 8.2 g/dL — ABNORMAL LOW (ref 13.0–17.0)
Hemoglobin: 8.5 g/dL — ABNORMAL LOW (ref 13.0–17.0)
Hemoglobin: 8.8 g/dL — ABNORMAL LOW (ref 13.0–17.0)
Hemoglobin: 8.8 g/dL — ABNORMAL LOW (ref 13.0–17.0)
Potassium: 3.7 mEq/L (ref 3.5–5.1)
Potassium: 3.7 meq/L (ref 3.5–5.1)
Potassium: 3.8 meq/L (ref 3.5–5.1)
Potassium: 3.8 meq/L (ref 3.5–5.1)
Potassium: 3.8 meq/L (ref 3.5–5.1)
Potassium: 3.9 mEq/L (ref 3.5–5.1)
Potassium: 4 meq/L (ref 3.5–5.1)
Potassium: 4.3 mEq/L (ref 3.5–5.1)
Potassium: 4.6 meq/L (ref 3.5–5.1)
Sodium: 128 mEq/L — ABNORMAL LOW (ref 135–145)
Sodium: 134 mEq/L — ABNORMAL LOW (ref 135–145)
Sodium: 134 meq/L — ABNORMAL LOW (ref 135–145)
Sodium: 135 mEq/L (ref 135–145)
Sodium: 135 mEq/L (ref 135–145)
Sodium: 136 meq/L (ref 135–145)
Sodium: 136 meq/L (ref 135–145)
Sodium: 137 meq/L (ref 135–145)
Sodium: 138 mEq/L (ref 135–145)

## 2010-09-15 LAB — BASIC METABOLIC PANEL WITH GFR
BUN: 7 mg/dL (ref 6–23)
BUN: 7 mg/dL (ref 6–23)
CO2: 25 meq/L (ref 19–32)
CO2: 27 meq/L (ref 19–32)
Calcium: 7.1 mg/dL — ABNORMAL LOW (ref 8.4–10.5)
Calcium: 8.8 mg/dL (ref 8.4–10.5)
Calcium: 8.8 mg/dL (ref 8.4–10.5)
Chloride: 102 meq/L (ref 96–112)
Chloride: 102 meq/L (ref 96–112)
Creatinine, Ser: 0.78 mg/dL (ref 0.4–1.5)
GFR calc Af Amer: >60 mL/min (ref >60)
GFR calc Af Amer: >60 mL/min (ref >60)
GFR calc non Af Amer: >60 mL/min (ref >60)
Glucose, Bld: 102 mg/dL — ABNORMAL HIGH (ref 70–99)
Glucose, Bld: 223 mg/dL — ABNORMAL HIGH (ref 70–99)
Potassium: 3.8 meq/L (ref 3.5–5.1)
Potassium: 4.5 meq/L (ref 3.5–5.1)
Sodium: 136 meq/L (ref 135–145)
Sodium: 138 meq/L (ref 135–145)

## 2010-09-15 LAB — GLUCOSE, CAPILLARY
Glucose-Capillary: 100 mg/dL — ABNORMAL HIGH (ref 70–99)
Glucose-Capillary: 100 mg/dL — ABNORMAL HIGH (ref 70–99)
Glucose-Capillary: 101 mg/dL — ABNORMAL HIGH (ref 70–99)
Glucose-Capillary: 101 mg/dL — ABNORMAL HIGH (ref 70–99)
Glucose-Capillary: 101 mg/dL — ABNORMAL HIGH (ref 70–99)
Glucose-Capillary: 103 mg/dL — ABNORMAL HIGH (ref 70–99)
Glucose-Capillary: 106 mg/dL — ABNORMAL HIGH (ref 70–99)
Glucose-Capillary: 106 mg/dL — ABNORMAL HIGH (ref 70–99)
Glucose-Capillary: 108 mg/dL — ABNORMAL HIGH (ref 70–99)
Glucose-Capillary: 109 mg/dL — ABNORMAL HIGH (ref 70–99)
Glucose-Capillary: 109 mg/dL — ABNORMAL HIGH (ref 70–99)
Glucose-Capillary: 110 mg/dL — ABNORMAL HIGH (ref 70–99)
Glucose-Capillary: 110 mg/dL — ABNORMAL HIGH (ref 70–99)
Glucose-Capillary: 110 mg/dL — ABNORMAL HIGH (ref 70–99)
Glucose-Capillary: 111 mg/dL — ABNORMAL HIGH (ref 70–99)
Glucose-Capillary: 114 mg/dL — ABNORMAL HIGH (ref 70–99)
Glucose-Capillary: 116 mg/dL — ABNORMAL HIGH (ref 70–99)
Glucose-Capillary: 117 mg/dL — ABNORMAL HIGH (ref 70–99)
Glucose-Capillary: 120 mg/dL — ABNORMAL HIGH (ref 70–99)
Glucose-Capillary: 120 mg/dL — ABNORMAL HIGH (ref 70–99)
Glucose-Capillary: 124 mg/dL — ABNORMAL HIGH (ref 70–99)
Glucose-Capillary: 125 mg/dL — ABNORMAL HIGH (ref 70–99)
Glucose-Capillary: 130 mg/dL — ABNORMAL HIGH (ref 70–99)
Glucose-Capillary: 130 mg/dL — ABNORMAL HIGH (ref 70–99)
Glucose-Capillary: 144 mg/dL — ABNORMAL HIGH (ref 70–99)
Glucose-Capillary: 156 mg/dL — ABNORMAL HIGH (ref 70–99)
Glucose-Capillary: 172 mg/dL — ABNORMAL HIGH (ref 70–99)
Glucose-Capillary: 173 mg/dL — ABNORMAL HIGH (ref 70–99)
Glucose-Capillary: 174 mg/dL — ABNORMAL HIGH (ref 70–99)
Glucose-Capillary: 179 mg/dL — ABNORMAL HIGH (ref 70–99)
Glucose-Capillary: 184 mg/dL — ABNORMAL HIGH (ref 70–99)
Glucose-Capillary: 188 mg/dL — ABNORMAL HIGH (ref 70–99)
Glucose-Capillary: 202 mg/dL — ABNORMAL HIGH (ref 70–99)
Glucose-Capillary: 206 mg/dL — ABNORMAL HIGH (ref 70–99)
Glucose-Capillary: 206 mg/dL — ABNORMAL HIGH (ref 70–99)
Glucose-Capillary: 214 mg/dL — ABNORMAL HIGH (ref 70–99)
Glucose-Capillary: 221 mg/dL — ABNORMAL HIGH (ref 70–99)
Glucose-Capillary: 224 mg/dL — ABNORMAL HIGH (ref 70–99)
Glucose-Capillary: 230 mg/dL — ABNORMAL HIGH (ref 70–99)
Glucose-Capillary: 233 mg/dL — ABNORMAL HIGH (ref 70–99)
Glucose-Capillary: 234 mg/dL — ABNORMAL HIGH (ref 70–99)
Glucose-Capillary: 241 mg/dL — ABNORMAL HIGH (ref 70–99)
Glucose-Capillary: 246 mg/dL — ABNORMAL HIGH (ref 70–99)
Glucose-Capillary: 253 mg/dL — ABNORMAL HIGH (ref 70–99)
Glucose-Capillary: 253 mg/dL — ABNORMAL HIGH (ref 70–99)
Glucose-Capillary: 265 mg/dL — ABNORMAL HIGH (ref 70–99)
Glucose-Capillary: 270 mg/dL — ABNORMAL HIGH (ref 70–99)
Glucose-Capillary: 281 mg/dL — ABNORMAL HIGH (ref 70–99)
Glucose-Capillary: 286 mg/dL — ABNORMAL HIGH (ref 70–99)
Glucose-Capillary: 295 mg/dL — ABNORMAL HIGH (ref 70–99)
Glucose-Capillary: 315 mg/dL — ABNORMAL HIGH (ref 70–99)
Glucose-Capillary: 315 mg/dL — ABNORMAL HIGH (ref 70–99)
Glucose-Capillary: 331 mg/dL — ABNORMAL HIGH (ref 70–99)
Glucose-Capillary: 348 mg/dL — ABNORMAL HIGH (ref 70–99)
Glucose-Capillary: 360 mg/dL — ABNORMAL HIGH (ref 70–99)
Glucose-Capillary: 365 mg/dL — ABNORMAL HIGH (ref 70–99)
Glucose-Capillary: 367 mg/dL — ABNORMAL HIGH (ref 70–99)
Glucose-Capillary: 369 mg/dL — ABNORMAL HIGH (ref 70–99)
Glucose-Capillary: 399 mg/dL — ABNORMAL HIGH (ref 70–99)
Glucose-Capillary: 416 mg/dL — ABNORMAL HIGH (ref 70–99)
Glucose-Capillary: 50 mg/dL — ABNORMAL LOW (ref 70–99)
Glucose-Capillary: 62 mg/dL — ABNORMAL LOW (ref 70–99)
Glucose-Capillary: 66 mg/dL — ABNORMAL LOW (ref 70–99)
Glucose-Capillary: 78 mg/dL (ref 70–99)
Glucose-Capillary: 78 mg/dL (ref 70–99)
Glucose-Capillary: 80 mg/dL (ref 70–99)
Glucose-Capillary: 83 mg/dL (ref 70–99)
Glucose-Capillary: 85 mg/dL (ref 70–99)
Glucose-Capillary: 88 mg/dL (ref 70–99)
Glucose-Capillary: 91 mg/dL (ref 70–99)
Glucose-Capillary: 92 mg/dL (ref 70–99)
Glucose-Capillary: 93 mg/dL (ref 70–99)
Glucose-Capillary: 94 mg/dL (ref 70–99)
Glucose-Capillary: 97 mg/dL (ref 70–99)
Glucose-Capillary: 98 mg/dL (ref 70–99)
Glucose-Capillary: 98 mg/dL (ref 70–99)
Glucose-Capillary: 99 mg/dL (ref 70–99)
Glucose-Capillary: 99 mg/dL (ref 70–99)

## 2010-09-15 LAB — POCT I-STAT 3, VENOUS BLOOD GAS (G3P V)
Acid-Base Excess: 10 mmol/L — ABNORMAL HIGH (ref 0.0–2.0)
Bicarbonate: 35.7 mEq/L — ABNORMAL HIGH (ref 20.0–24.0)
O2 Saturation: 76 %
TCO2: 37 mmol/L (ref 0–100)
pCO2, Ven: 53.3 mmHg — ABNORMAL HIGH (ref 45.0–50.0)
pH, Ven: 7.434 — ABNORMAL HIGH (ref 7.250–7.300)
pO2, Ven: 41 mmHg (ref 30.0–45.0)

## 2010-09-15 LAB — POCT I-STAT 3, ART BLOOD GAS (G3+)
Acid-Base Excess: 1 mmol/L (ref 0.0–2.0)
Acid-Base Excess: 2 mmol/L (ref 0.0–2.0)
Acid-base deficit: 2 mmol/L (ref 0.0–2.0)
Bicarbonate: 22.8 meq/L (ref 20.0–24.0)
Bicarbonate: 23.9 meq/L (ref 20.0–24.0)
Bicarbonate: 25.1 meq/L — ABNORMAL HIGH (ref 20.0–24.0)
Bicarbonate: 25.7 mEq/L — ABNORMAL HIGH (ref 20.0–24.0)
Bicarbonate: 27 mEq/L — ABNORMAL HIGH (ref 20.0–24.0)
O2 Saturation: 100 %
O2 Saturation: 100 %
O2 Saturation: 96 %
O2 Saturation: 97 %
O2 Saturation: 98 %
Patient temperature: 36.3
Patient temperature: 37.8
Patient temperature: 38
TCO2: 24 mmol/L (ref 0–100)
TCO2: 25 mmol/L (ref 0–100)
TCO2: 26 mmol/L (ref 0–100)
TCO2: 27 mmol/L (ref 0–100)
TCO2: 28 mmol/L (ref 0–100)
pCO2 arterial: 38.1 mmHg (ref 35.0–45.0)
pCO2 arterial: 39.7 mmHg (ref 35.0–45.0)
pCO2 arterial: 40.8 mmHg (ref 35.0–45.0)
pCO2 arterial: 41.2 mmHg (ref 35.0–45.0)
pCO2 arterial: 45 mmHg (ref 35.0–45.0)
pH, Arterial: 7.371 (ref 7.350–7.450)
pH, Arterial: 7.385 (ref 7.350–7.450)
pH, Arterial: 7.39 (ref 7.350–7.450)
pH, Arterial: 7.406 (ref 7.350–7.450)
pH, Arterial: 7.409 (ref 7.350–7.450)
pO2, Arterial: 107 mmHg — ABNORMAL HIGH (ref 80.0–100.0)
pO2, Arterial: 175 mmHg — ABNORMAL HIGH (ref 80.0–100.0)
pO2, Arterial: 390 mmHg — ABNORMAL HIGH (ref 80.0–100.0)
pO2, Arterial: 92 mmHg (ref 80.0–100.0)
pO2, Arterial: 92 mmHg (ref 80.0–100.0)

## 2010-09-15 LAB — COMPREHENSIVE METABOLIC PANEL
AST: 25 U/L (ref 0–37)
Albumin: 3.6 g/dL (ref 3.5–5.2)
BUN: 7 mg/dL (ref 6–23)
Calcium: 8.8 mg/dL (ref 8.4–10.5)
Chloride: 104 mEq/L (ref 96–112)
Creatinine, Ser: 0.77 mg/dL (ref 0.4–1.5)
GFR calc Af Amer: >60 mL/min (ref >60)
GFR calc non Af Amer: >60 mL/min (ref >60)
Total Bilirubin: 0.3 mg/dL (ref 0.3–1.2)

## 2010-09-15 LAB — DIFFERENTIAL
Basophils Absolute: 0 10*3/uL (ref 0.0–0.1)
Basophils Absolute: 0 K/uL (ref 0.0–0.1)
Basophils Absolute: 0 K/uL (ref 0.0–0.1)
Basophils Absolute: 0 K/uL (ref 0.0–0.1)
Basophils Absolute: 0 K/uL (ref 0.0–0.1)
Basophils Relative: 0 % (ref 0–1)
Basophils Relative: 0 % (ref 0–1)
Basophils Relative: 0 % (ref 0–1)
Basophils Relative: 0 % (ref 0–1)
Basophils Relative: 1 % (ref 0–1)
Eosinophils Absolute: 0.1 10*3/uL (ref 0.0–0.7)
Eosinophils Absolute: 0.1 10*3/uL (ref 0.0–0.7)
Eosinophils Absolute: 0.1 K/uL (ref 0.0–0.7)
Eosinophils Absolute: 0.2 10*3/uL (ref 0.0–0.7)
Eosinophils Absolute: 0.2 10*3/uL (ref 0.0–0.7)
Eosinophils Relative: 2 % (ref 0–5)
Eosinophils Relative: 3 % (ref 0–5)
Eosinophils Relative: 3 % (ref 0–5)
Eosinophils Relative: 3 % (ref 0–5)
Eosinophils Relative: 4 % (ref 0–5)
Lymphocytes Relative: 22 % (ref 12–46)
Lymphocytes Relative: 23 % (ref 12–46)
Lymphocytes Relative: 24 % (ref 12–46)
Lymphocytes Relative: 26 % (ref 12–46)
Lymphocytes Relative: 30 % (ref 12–46)
Lymphs Abs: 1.1 K/uL (ref 0.7–4.0)
Lymphs Abs: 1.3 K/uL (ref 0.7–4.0)
Lymphs Abs: 1.5 10*3/uL (ref 0.7–4.0)
Lymphs Abs: 1.5 K/uL (ref 0.7–4.0)
Lymphs Abs: 1.7 K/uL (ref 0.7–4.0)
Monocytes Absolute: 0.5 K/uL (ref 0.1–1.0)
Monocytes Absolute: 0.6 K/uL (ref 0.1–1.0)
Monocytes Absolute: 0.7 10*3/uL (ref 0.1–1.0)
Monocytes Absolute: 0.7 10*3/uL (ref 0.1–1.0)
Monocytes Absolute: 0.7 10*3/uL (ref 0.1–1.0)
Monocytes Relative: 11 % (ref 3–12)
Monocytes Relative: 11 % (ref 3–12)
Monocytes Relative: 11 % (ref 3–12)
Monocytes Relative: 11 % (ref 3–12)
Monocytes Relative: 12 % (ref 3–12)
Neutro Abs: 3 K/uL (ref 1.7–7.7)
Neutro Abs: 3.1 K/uL (ref 1.7–7.7)
Neutro Abs: 3.3 K/uL (ref 1.7–7.7)
Neutro Abs: 3.7 10*3/uL (ref 1.7–7.7)
Neutro Abs: 4 K/uL (ref 1.7–7.7)
Neutrophils Relative %: 53 % (ref 43–77)
Neutrophils Relative %: 59 % (ref 43–77)
Neutrophils Relative %: 62 % (ref 43–77)
Neutrophils Relative %: 63 % (ref 43–77)
Neutrophils Relative %: 64 % (ref 43–77)

## 2010-09-15 LAB — POCT I-STAT, CHEM 8
BUN: 12 mg/dL (ref 6–23)
BUN: 7 mg/dL (ref 6–23)
Calcium, Ion: 1.07 mmol/L — ABNORMAL LOW (ref 1.12–1.32)
Calcium, Ion: 1.11 mmol/L — ABNORMAL LOW (ref 1.12–1.32)
Chloride: 105 meq/L (ref 96–112)
Chloride: 97 meq/L (ref 96–112)
Creatinine, Ser: 0.6 mg/dL (ref 0.4–1.5)
Creatinine, Ser: 0.8 mg/dL (ref 0.4–1.5)
Glucose, Bld: 132 mg/dL — ABNORMAL HIGH (ref 70–99)
Glucose, Bld: 255 mg/dL — ABNORMAL HIGH (ref 70–99)
HCT: 23 % — ABNORMAL LOW (ref 39.0–52.0)
HCT: 23 % — ABNORMAL LOW (ref 39.0–52.0)
Hemoglobin: 7.8 g/dL — ABNORMAL LOW (ref 13.0–17.0)
Hemoglobin: 7.8 g/dL — ABNORMAL LOW (ref 13.0–17.0)
Potassium: 4.4 meq/L (ref 3.5–5.1)
Potassium: 4.8 mEq/L (ref 3.5–5.1)
Sodium: 133 meq/L — ABNORMAL LOW (ref 135–145)
Sodium: 138 mEq/L (ref 135–145)
TCO2: 23 mmol/L (ref 0–100)
TCO2: 26 mmol/L (ref 0–100)

## 2010-09-15 LAB — URINALYSIS, ROUTINE W REFLEX MICROSCOPIC
Bilirubin Urine: NEGATIVE
Glucose, UA: NEGATIVE mg/dL
Hgb urine dipstick: NEGATIVE
Ketones, ur: NEGATIVE mg/dL
Nitrite: NEGATIVE
Protein, ur: NEGATIVE mg/dL
Specific Gravity, Urine: 1.008 (ref 1.005–1.030)
Urobilinogen, UA: 0.2 mg/dL (ref 0.0–1.0)
pH: 7 (ref 5.0–8.0)

## 2010-09-15 LAB — BASIC METABOLIC PANEL
BUN: 12 mg/dL (ref 6–23)
BUN: 13 mg/dL (ref 6–23)
BUN: 14 mg/dL (ref 6–23)
BUN: 19 mg/dL (ref 6–23)
BUN: 26 mg/dL — ABNORMAL HIGH (ref 6–23)
BUN: 26 mg/dL — ABNORMAL HIGH (ref 6–23)
BUN: 9 mg/dL (ref 6–23)
CO2: 26 mEq/L (ref 19–32)
CO2: 28 mEq/L (ref 19–32)
CO2: 29 mEq/L (ref 19–32)
CO2: 29 mEq/L (ref 19–32)
CO2: 31 mEq/L (ref 19–32)
CO2: 32 mEq/L (ref 19–32)
CO2: 32 mEq/L (ref 19–32)
Calcium: 7.5 mg/dL — ABNORMAL LOW (ref 8.4–10.5)
Calcium: 7.9 mg/dL — ABNORMAL LOW (ref 8.4–10.5)
Calcium: 8 mg/dL — ABNORMAL LOW (ref 8.4–10.5)
Calcium: 8.6 mg/dL (ref 8.4–10.5)
Calcium: 8.9 mg/dL (ref 8.4–10.5)
Chloride: 103 mEq/L (ref 96–112)
Chloride: 94 mEq/L — ABNORMAL LOW (ref 96–112)
Chloride: 95 mEq/L — ABNORMAL LOW (ref 96–112)
Chloride: 95 mEq/L — ABNORMAL LOW (ref 96–112)
Chloride: 96 mEq/L (ref 96–112)
Chloride: 97 mEq/L (ref 96–112)
Chloride: 97 mEq/L (ref 96–112)
Creatinine, Ser: 0.68 mg/dL (ref 0.4–1.5)
Creatinine, Ser: 0.7 mg/dL (ref 0.4–1.5)
Creatinine, Ser: 0.7 mg/dL (ref 0.4–1.5)
Creatinine, Ser: 0.73 mg/dL (ref 0.4–1.5)
Creatinine, Ser: 0.78 mg/dL (ref 0.4–1.5)
Creatinine, Ser: 0.8 mg/dL (ref 0.4–1.5)
Creatinine, Ser: 0.82 mg/dL (ref 0.4–1.5)
GFR calc Af Amer: >60 mL/min (ref >60)
GFR calc Af Amer: >60 mL/min (ref >60)
GFR calc Af Amer: >60 mL/min (ref >60)
GFR calc Af Amer: >60 mL/min (ref >60)
GFR calc Af Amer: >60 mL/min (ref >60)
GFR calc Af Amer: >60 mL/min (ref >60)
GFR calc Af Amer: >60 mL/min (ref >60)
GFR calc non Af Amer: >60 mL/min (ref >60)
GFR calc non Af Amer: >60 mL/min (ref >60)
GFR calc non Af Amer: >60 mL/min (ref >60)
GFR calc non Af Amer: >60 mL/min (ref >60)
GFR calc non Af Amer: >60 mL/min (ref >60)
GFR calc non Af Amer: >60 mL/min (ref >60)
GFR calc non Af Amer: >60 mL/min (ref >60)
GFR calc non Af Amer: >60 mL/min (ref >60)
Glucose, Bld: 107 mg/dL — ABNORMAL HIGH (ref 70–99)
Glucose, Bld: 117 mg/dL — ABNORMAL HIGH (ref 70–99)
Glucose, Bld: 137 mg/dL — ABNORMAL HIGH (ref 70–99)
Glucose, Bld: 162 mg/dL — ABNORMAL HIGH (ref 70–99)
Glucose, Bld: 194 mg/dL — ABNORMAL HIGH (ref 70–99)
Glucose, Bld: 268 mg/dL — ABNORMAL HIGH (ref 70–99)
Glucose, Bld: 52 mg/dL — ABNORMAL LOW (ref 70–99)
Potassium: 3.5 mEq/L (ref 3.5–5.1)
Potassium: 3.9 mEq/L (ref 3.5–5.1)
Potassium: 4.1 mEq/L (ref 3.5–5.1)
Potassium: 4.2 mEq/L (ref 3.5–5.1)
Potassium: 4.2 mEq/L (ref 3.5–5.1)
Potassium: 4.3 mEq/L (ref 3.5–5.1)
Potassium: 4.5 mEq/L (ref 3.5–5.1)
Sodium: 130 mEq/L — ABNORMAL LOW (ref 135–145)
Sodium: 133 mEq/L — ABNORMAL LOW (ref 135–145)
Sodium: 133 mEq/L — ABNORMAL LOW (ref 135–145)
Sodium: 134 mEq/L — ABNORMAL LOW (ref 135–145)
Sodium: 134 mEq/L — ABNORMAL LOW (ref 135–145)
Sodium: 135 mEq/L (ref 135–145)
Sodium: 140 mEq/L (ref 135–145)

## 2010-09-15 LAB — BLOOD GAS, ARTERIAL
Acid-Base Excess: 2.1 mmol/L — ABNORMAL HIGH (ref 0.0–2.0)
O2 Content: 2 L/min
O2 Saturation: 97.4 %
Patient temperature: 98.6
TCO2: 27.5 mmol/L (ref 0–100)
pCO2 arterial: 41.7 mmHg (ref 35.0–45.0)

## 2010-09-15 LAB — CK TOTAL AND CKMB (NOT AT ARMC)
CK, MB: 2.7 ng/mL (ref 0.3–4.0)
CK, MB: 4.9 ng/mL — ABNORMAL HIGH (ref 0.3–4.0)
Relative Index: 4.4 — ABNORMAL HIGH (ref 0.0–2.5)
Total CK: 111 U/L (ref 7–232)
Total CK: 65 U/L (ref 7–232)

## 2010-09-15 LAB — HEPARIN LEVEL (UNFRACTIONATED)
Heparin Unfractionated: 0.47 IU/mL (ref 0.30–0.70)
Heparin Unfractionated: 0.48 IU/mL (ref 0.30–0.70)
Heparin Unfractionated: 0.55 [IU]/mL (ref 0.30–0.70)
Heparin Unfractionated: 0.6 IU/mL (ref 0.30–0.70)
Heparin Unfractionated: 0.83 [IU]/mL — ABNORMAL HIGH (ref 0.30–0.70)
Heparin Unfractionated: <0.1 [IU]/mL — ABNORMAL LOW (ref 0.30–0.70)

## 2010-09-15 LAB — POCT CARDIAC MARKERS
CKMB, poc: 2.2 ng/mL (ref 1.0–8.0)
Myoglobin, poc: 155 ng/mL (ref 12–200)
Troponin i, poc: 0.19 ng/mL — ABNORMAL HIGH (ref 0.00–0.09)

## 2010-09-15 LAB — PLATELET INHIBITION P2Y12
P2Y12 % Inhibition: 18 %
P2Y12 % Inhibition: 27 %
Platelet Function  P2Y12: 202 [PRU] (ref 194–418)
Platelet Function  P2Y12: 221 [PRU] (ref 194–418)
Platelet Function Baseline: 269 [PRU] (ref 194–418)
Platelet Function Baseline: 279 [PRU] (ref 194–418)

## 2010-09-15 LAB — CLOSTRIDIUM DIFFICILE EIA
C difficile Toxins A+B, EIA: NEGATIVE
C difficile Toxins A+B, EIA: NEGATIVE

## 2010-09-15 LAB — PREPARE PLATELETS

## 2010-09-15 LAB — PROTIME-INR
INR: 1.01 (ref 0.00–1.49)
INR: 1.53 — ABNORMAL HIGH (ref 0.00–1.49)
Prothrombin Time: 13.2 seconds (ref 11.6–15.2)
Prothrombin Time: 18.3 seconds — ABNORMAL HIGH (ref 11.6–15.2)

## 2010-09-15 LAB — LIPID PANEL
Cholesterol: 139 mg/dL (ref 0–200)
LDL Cholesterol: 66 mg/dL (ref 0–99)
Triglycerides: 172 mg/dL — ABNORMAL HIGH (ref <150)
VLDL: 34 mg/dL (ref 0–40)

## 2010-09-15 LAB — CREATININE, SERUM
Creatinine, Ser: 0.77 mg/dL (ref 0.4–1.5)
Creatinine, Ser: 0.85 mg/dL (ref 0.4–1.5)
GFR calc Af Amer: >60 mL/min (ref >60)
GFR calc Af Amer: >60 mL/min (ref >60)
GFR calc non Af Amer: >60 mL/min (ref >60)
GFR calc non Af Amer: >60 mL/min (ref >60)

## 2010-09-15 LAB — CROSSMATCH: ABO/RH(D): O NEG

## 2010-09-15 LAB — PLATELET FUNCTION ASSAY
Collagen / ADP: 89 seconds (ref 0–114)
Collagen / Epinephrine: 190 seconds (ref 0–184)

## 2010-09-15 LAB — ABO/RH: ABO/RH(D): O NEG

## 2010-09-15 LAB — POCT I-STAT GLUCOSE
Glucose, Bld: 215 mg/dL — ABNORMAL HIGH (ref 70–99)
Operator id: 235491

## 2010-09-15 LAB — APTT
aPTT: 34 seconds (ref 24–37)
aPTT: 37 seconds (ref 24–37)

## 2010-09-15 LAB — MAGNESIUM
Magnesium: 2.1 mg/dL (ref 1.5–2.5)
Magnesium: 2.3 mg/dL (ref 1.5–2.5)
Magnesium: 2.6 mg/dL — ABNORMAL HIGH (ref 1.5–2.5)

## 2010-09-15 LAB — PREPARE FRESH FROZEN PLASMA

## 2010-09-15 LAB — HEMOGLOBIN AND HEMATOCRIT, BLOOD
HCT: 24.5 % — ABNORMAL LOW (ref 39.0–52.0)
Hemoglobin: 8.8 g/dL — ABNORMAL LOW (ref 13.0–17.0)

## 2010-09-15 LAB — CARDIAC PANEL(CRET KIN+CKTOT+MB+TROPI)
Relative Index: 5.7 — ABNORMAL HIGH (ref 0.0–2.5)
Total CK: 148 U/L (ref 7–232)
Troponin I: 6.68 ng/mL (ref 0.00–0.06)

## 2010-09-15 LAB — PLATELET COUNT: Platelets: 140 10*3/uL — ABNORMAL LOW (ref 150–400)

## 2010-09-15 LAB — HEMOGLOBIN A1C: Hgb A1c MFr Bld: 7.4 % — ABNORMAL HIGH (ref <5.7)

## 2010-09-15 LAB — D-DIMER, QUANTITATIVE: D-Dimer, Quant: <0.22 ug/mL-FEU (ref 0.00–0.48)

## 2010-09-15 LAB — TROPONIN I: Troponin I: 0.57 ng/mL (ref 0.00–0.06)

## 2010-09-15 NOTE — Assessment & Plan Note (Signed)
  Review of gastrointestinal problems: 1. routine risk for colon cancer: colonoscopy 2009 was normal (done for mild rectal bleeding), no polyps, no cancers.  Recall colonoscopy at 10 year interval. 2. Minor hemorrhoidal bleeding, 2012 (effient will cause worse bleeding).    History of Present Illness Visit Type: Initial Consult Primary GI MD: Rob Bunting MD Primary Provider: Rodrigo Ran, M.D. Chief Complaint: GI bleeding History of Present Illness:      pleasant 45 year old man whom I saw 3 years ago for colonoscopy, see those results summarized above. Since then he has had CABG and then multivessel stenting after graft failure in 2011.    He has noticed larger, fairly painful BMs.   He had been increasing the fiber in his diet after CAD disease noted.  he had CBC last month, normal.  He had fobt testing, negative.  BMs better after anusol and colace once daily.                 Current Medications (verified): 1)  Tylenol 325 Mg Tabs (Acetaminophen) .... As Needed 2)  Lisinopril 20 Mg Tabs (Lisinopril) .... Take One Tablet By Mouth Daily 3)  Metoprolol Tartrate 50 Mg Tabs (Metoprolol Tartrate) .... Take 1/2  Tablet By Mouth Twice A Day 4)  Vytorin 10-80 Mg Tabs (Ezetimibe-Simvastatin) .... Take One Tablet By Mouth Daily At Bedtime 5)  Aspirin 81 Mg Tbec (Aspirin) .... Take One Tablet By Mouth Daily 6)  Tegretol 200 Mg Tabs (Carbamazepine) .... 3 Tablets Two Times A Day 7)  Levetiracetam 500 Mg Tabs (Levetiracetam) .... Take 1/2 Tablet in The Morning and 1 Tablet in The Evening. 8)  Zoloft 100 Mg Tabs (Sertraline Hcl) .... Take One By Mouth Two Times A Day 9)  One-A-Day Mens  Tabs (Multiple Vitamin) .... Take 1 Tablet By Mouth Once A Day 10)  Novolog Insulin Pump .... As Directed 11)  Oxygen 2 Litters .... At Bedtime 12)  Effient 10 Mg Tabs (Prasugrel Hcl) .... Take One Tablet By Mouth Daily  Allergies (verified): No Known Drug Allergies  Vital Signs:  Patient profile:    45 year old male Height:      72 inches Weight:      229.6 pounds BMI:     31.25 Pulse rate:   80 / minute Pulse rhythm:   regular BP sitting:   98 / 58  (left arm) Cuff size:   regular  Vitals Entered By: Harlow Mares CMA Duncan Dull) (September 07, 2010 3:10 PM)  Physical Exam  Additional Exam:  Constitutional: generally well appearing, blunted affect Psychiatric: alert and oriented times 3 Abdomen: soft, non-tender, non-distended, normal bowel sounds rectal: small hemorrhoids, externally.  Brown stools.  No anal fissure.  no rectal masses.    Impression & Recommendations:  Problem # 1:  minor rectal bleeding almost certainly yhemorrhoidal.  He is on bloodthinners that contribute to amount of bleeding.  He will continue colace once daily, resume anusol as needed. Call if bleeding worsens.  No need for colonoscopy at this point.  Patient Instructions: 1)  No need for colonoscopy at this point. 2)  Continue colace once daily. 3)  A copy of this information will be sent to Dr. Waynard Edwards. 4)  The medication list was reviewed and reconciled.  All changed / newly prescribed medications were explained.  A complete medication list was provided to the patient / caregiver.

## 2010-09-16 ENCOUNTER — Encounter (HOSPITAL_COMMUNITY): Payer: Self-pay

## 2010-09-18 ENCOUNTER — Encounter (HOSPITAL_COMMUNITY): Payer: Self-pay

## 2010-09-21 ENCOUNTER — Other Ambulatory Visit: Payer: Self-pay | Admitting: Cardiovascular Disease

## 2010-09-21 ENCOUNTER — Encounter (HOSPITAL_COMMUNITY): Payer: Self-pay

## 2010-09-23 ENCOUNTER — Encounter (HOSPITAL_COMMUNITY): Payer: Self-pay

## 2010-09-24 NOTE — Telephone Encounter (Signed)
[address] °

## 2010-09-25 ENCOUNTER — Encounter (HOSPITAL_COMMUNITY): Payer: Self-pay

## 2010-09-28 ENCOUNTER — Encounter (HOSPITAL_COMMUNITY): Payer: Self-pay | Attending: Cardiovascular Disease

## 2010-09-28 DIAGNOSIS — F341 Dysthymic disorder: Secondary | ICD-10-CM | POA: Insufficient documentation

## 2010-09-28 DIAGNOSIS — Z951 Presence of aortocoronary bypass graft: Secondary | ICD-10-CM | POA: Insufficient documentation

## 2010-09-28 DIAGNOSIS — I319 Disease of pericardium, unspecified: Secondary | ICD-10-CM | POA: Insufficient documentation

## 2010-09-28 DIAGNOSIS — I252 Old myocardial infarction: Secondary | ICD-10-CM | POA: Insufficient documentation

## 2010-09-28 DIAGNOSIS — Z9641 Presence of insulin pump (external) (internal): Secondary | ICD-10-CM | POA: Insufficient documentation

## 2010-09-28 DIAGNOSIS — I10 Essential (primary) hypertension: Secondary | ICD-10-CM | POA: Insufficient documentation

## 2010-09-28 DIAGNOSIS — G4733 Obstructive sleep apnea (adult) (pediatric): Secondary | ICD-10-CM | POA: Insufficient documentation

## 2010-09-28 DIAGNOSIS — Z79899 Other long term (current) drug therapy: Secondary | ICD-10-CM | POA: Insufficient documentation

## 2010-09-28 DIAGNOSIS — I2510 Atherosclerotic heart disease of native coronary artery without angina pectoris: Secondary | ICD-10-CM | POA: Insufficient documentation

## 2010-09-28 DIAGNOSIS — E109 Type 1 diabetes mellitus without complications: Secondary | ICD-10-CM | POA: Insufficient documentation

## 2010-09-28 DIAGNOSIS — Z794 Long term (current) use of insulin: Secondary | ICD-10-CM | POA: Insufficient documentation

## 2010-09-28 DIAGNOSIS — E785 Hyperlipidemia, unspecified: Secondary | ICD-10-CM | POA: Insufficient documentation

## 2010-09-28 DIAGNOSIS — Q8500 Neurofibromatosis, unspecified: Secondary | ICD-10-CM | POA: Insufficient documentation

## 2010-09-28 DIAGNOSIS — I498 Other specified cardiac arrhythmias: Secondary | ICD-10-CM | POA: Insufficient documentation

## 2010-09-28 DIAGNOSIS — Z5189 Encounter for other specified aftercare: Secondary | ICD-10-CM | POA: Insufficient documentation

## 2010-09-30 ENCOUNTER — Encounter (HOSPITAL_COMMUNITY): Payer: Self-pay

## 2010-10-02 ENCOUNTER — Encounter (HOSPITAL_COMMUNITY): Payer: Self-pay

## 2010-10-05 ENCOUNTER — Encounter (HOSPITAL_COMMUNITY): Payer: Self-pay

## 2010-10-07 ENCOUNTER — Encounter (HOSPITAL_COMMUNITY): Payer: Self-pay

## 2010-10-09 ENCOUNTER — Encounter (HOSPITAL_COMMUNITY): Payer: Self-pay

## 2010-10-13 ENCOUNTER — Encounter (HOSPITAL_COMMUNITY): Payer: Self-pay

## 2010-10-14 ENCOUNTER — Encounter (HOSPITAL_COMMUNITY): Payer: Self-pay

## 2010-10-16 ENCOUNTER — Encounter (HOSPITAL_COMMUNITY): Payer: Self-pay

## 2010-10-19 ENCOUNTER — Encounter (HOSPITAL_COMMUNITY): Payer: Self-pay

## 2010-10-21 ENCOUNTER — Encounter (HOSPITAL_COMMUNITY): Payer: Self-pay

## 2010-10-23 ENCOUNTER — Encounter (HOSPITAL_COMMUNITY): Payer: Self-pay

## 2010-10-26 ENCOUNTER — Encounter (HOSPITAL_COMMUNITY): Payer: Self-pay

## 2010-10-28 ENCOUNTER — Encounter (HOSPITAL_COMMUNITY): Payer: Self-pay | Attending: Cardiovascular Disease

## 2010-10-28 DIAGNOSIS — I252 Old myocardial infarction: Secondary | ICD-10-CM | POA: Insufficient documentation

## 2010-10-28 DIAGNOSIS — Q8500 Neurofibromatosis, unspecified: Secondary | ICD-10-CM | POA: Insufficient documentation

## 2010-10-28 DIAGNOSIS — Z5189 Encounter for other specified aftercare: Secondary | ICD-10-CM | POA: Insufficient documentation

## 2010-10-28 DIAGNOSIS — Z794 Long term (current) use of insulin: Secondary | ICD-10-CM | POA: Insufficient documentation

## 2010-10-28 DIAGNOSIS — I2510 Atherosclerotic heart disease of native coronary artery without angina pectoris: Secondary | ICD-10-CM | POA: Insufficient documentation

## 2010-10-28 DIAGNOSIS — E785 Hyperlipidemia, unspecified: Secondary | ICD-10-CM | POA: Insufficient documentation

## 2010-10-28 DIAGNOSIS — G4733 Obstructive sleep apnea (adult) (pediatric): Secondary | ICD-10-CM | POA: Insufficient documentation

## 2010-10-28 DIAGNOSIS — I319 Disease of pericardium, unspecified: Secondary | ICD-10-CM | POA: Insufficient documentation

## 2010-10-28 DIAGNOSIS — E109 Type 1 diabetes mellitus without complications: Secondary | ICD-10-CM | POA: Insufficient documentation

## 2010-10-28 DIAGNOSIS — Z9641 Presence of insulin pump (external) (internal): Secondary | ICD-10-CM | POA: Insufficient documentation

## 2010-10-28 DIAGNOSIS — F341 Dysthymic disorder: Secondary | ICD-10-CM | POA: Insufficient documentation

## 2010-10-28 DIAGNOSIS — Z79899 Other long term (current) drug therapy: Secondary | ICD-10-CM | POA: Insufficient documentation

## 2010-10-28 DIAGNOSIS — I10 Essential (primary) hypertension: Secondary | ICD-10-CM | POA: Insufficient documentation

## 2010-10-28 DIAGNOSIS — I498 Other specified cardiac arrhythmias: Secondary | ICD-10-CM | POA: Insufficient documentation

## 2010-10-28 DIAGNOSIS — Z951 Presence of aortocoronary bypass graft: Secondary | ICD-10-CM | POA: Insufficient documentation

## 2010-10-29 ENCOUNTER — Telehealth: Payer: Self-pay | Admitting: Cardiovascular Disease

## 2010-10-29 MED ORDER — METOPROLOL TARTRATE 25 MG PO TABS: 25.0000 mg | ORAL_TABLET | Freq: Two times a day (BID) | ORAL | Status: DC

## 2010-10-29 NOTE — Telephone Encounter (Signed)
Pt needs lisinopril 20mg  and metoprolol 25mg  to be called in to [facility] in [place] # [phone]

## 2010-10-30 ENCOUNTER — Encounter (HOSPITAL_COMMUNITY): Payer: Self-pay

## 2010-11-02 ENCOUNTER — Encounter (HOSPITAL_COMMUNITY): Payer: Self-pay

## 2010-11-04 ENCOUNTER — Encounter (HOSPITAL_COMMUNITY): Payer: Self-pay

## 2010-11-06 ENCOUNTER — Encounter (HOSPITAL_COMMUNITY): Payer: Self-pay

## 2010-11-09 ENCOUNTER — Encounter (HOSPITAL_COMMUNITY): Payer: Self-pay

## 2010-11-10 NOTE — Op Note (Signed)
NAMEYOBANI, SCHERTZER               ACCOUNT NO.:  [id number]   MEDICAL RECORD NO.:  [id number]          PATIENT TYPE:  AMB   LOCATION:  DSC                          FACILITY:  [facility]   PHYSICIAN:  Katy Fitch. Sypher, M.D. DATE OF BIRTH:  1966/03/11   DATE OF PROCEDURE:  11/24/2007  DATE OF DISCHARGE:                               OPERATIVE REPORT   PREOPERATIVE DIAGNOSES:  1. 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  2. 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  Both      are coexisting with insulin-dependent diabetes.   POSTOPERATIVE DIAGNOSES:  1. 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  2. 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  Both      are coexisting with insulin-dependent diabetes.   OPERATION:  1. Decompression of right ulnar nerve at cubital tunnel in situ.  2. Decompression of right median nerve at carpal tunnel by release of      the right transverse carpal ligament.   OPERATING SURGEON:  Katy Fitch. Sypher, MD   ASSISTANT:  Marveen Reeks Dasnoit PA-C   ANESTHESIA:  General by LMA.   SUPERVISING ANESTHESIOLOGIST:  Burna Forts, MD   INDICATIONS:  Jeff [name] is a 45 year old right-hand dominant  gentleman referred through the courtesy of Dr. Rodrigo Ran for  evaluation and management of bilateral hand pain and numbness.  Mr.  Teo has a number of background medical problems including insulin-  dependent diabetes, history of neurofibromatosis and a severe mixed  sleep apnea.   He has had increasing discomfort in his upper extremities and was  evaluated by both Dr. Waynard Edwards and Dr. Corliss Skains.  He was initially noted  to have elevated markers for inflammation including elevated sed rate.  His repeat lab studies by Dr. Corliss Skains, however, were within normal  limits.  He is known to have early palmar fibromatosis associated with  diabetes including a Dupuytren's type diathesis presenting in the  right  palmar fascia and bilateral arm and hand numbness.   He was evaluated at our office for possible entrapment neuropathy and  had electrodiagnostic studies completed by Dr. Wadie Lessen.  These were  remarkable for significant bilateral carpal tunnel syndrome and a right  ulnar neuropathy at the cubital tunnel.   Mr. Culley is thought through his problem lists and decides at this time  to proceed with decompression of his median and ulnar nerves on the  right followed by decompression of the median nerve on the left.   PROCEDURE:  Amado Andal is brought to the operating room and placed in  supine position on the operating table.   Following an anesthesia consult with Dr. Jacklynn Bue, general anesthesia by  LMA technique was recommended and accepted.   He was brought to room 1, placed in supine position on the operating  table and under Dr. Marlane Mingle direct supervision, general anesthesia by  LMA technique induced.   The right arm was prepped with Betadine soap solution and sterilely  draped.  A pneumatic tourniquet was applied to the proximal right  brachium.  Upon exsanguination  of the right arm with an Esmarch bandage,  the arterial tourniquet was inflated to 230 mmHg.  The procedure  commenced with a short incision in the line of the ring finger and the  palm.  Subcutaneous tissues were carefully divided revealing the palmar  fascia.  This was split longitudinally through the common sensory branch  of the median nerve.  The common sensory branches were followed  proximally to the median nerve proper.  The nerve was separated from the  transverse carpal ligament using a Penfield #4 Engineer, structural.   The transverse carpal ligament was released along its ulnar border  extending into the distal forearm.  This widely opened the carpal canal.  No masses or predicaments were noted.   Bleeding points along the margin of the released ligament were  electrocauterized with bipolar current  followed by repair of the skin  with intradermal 3-0 Prolene suture.   A Steri-Strips was applied followed by infiltration of the wound with 2%  lidocaine.  A compressive dressing was applied.  Attention then directed  to the right medial elbow.   A 3-cm incision was fashioned directly over path of the ulnar nerve.  Subcutaneous tissues were carefully divided taking care to identify and  spare the posterior branch of the medial antebrachial cutaneous nerve.   The ulnar nerve was identified posterior to the epicondyle.  The arcuate  ligament and the fascia at the head of the flexor carpi ulnaris was  released over a distance of 6 cm.  Neurolysis of the nerve was  accomplished externally.  Attention then directed to the ulnar nerve  proximal passage above the epicondyle.  The nerve was decompressed by  release of the brachial fascia 6 cm above the cubital tunnel.  There no  mass or predicaments noted.   The nerve was examined through a range of motion of 0 to 140 degrees of  flexion.  The nerve was stable in all positions.   The wound was then irrigated and bleeding points were electrocauterized  with bipolar current.  The wound was repaired with subcutaneous suture  of 4-0 Vicryl and intradermal 3-0 Prolene with Steri-Strips.   There were no apparent complications.   Mr. Haskew tolerated the surgery and anesthesia well.  He was transferred  to the recovery room with stable signs.      Katy Fitch Sypher, M.D.  Electronically Signed     RVS/MEDQ  D:  11/24/2007  T:  11/24/2007  Job:  [id number]

## 2010-11-10 NOTE — Assessment & Plan Note (Signed)
OFFICE VISIT   Jeff [name], Jeff [name]  DOB:  May 17, 1966                                        November 26, 2009  CHART #:  [id number]   CURRENT PROBLEMS:  1. Status post multivessel bypass grafting, October 16, 2009, for severe      multivessel disease with unstable angina.  2. Nonhealing of upper sternal incision.  3. Type 1 diabetes mellitus.   PRESENT ILLNESS:  The patient is a 45 year old Caucasian male, type 1  diabetic, returns for wound check.  The small area of the upper sternal  wound following CABG has been healing by secondary intent with clean  granulation tissue above the sternum.  The wife has been doing wet-to-  dry dressing changes b.i.d. with part of a 2 x 2 gauze.  He has  completed a course of oral antibiotics and the cultures of the wound  were negative.  His diabetic control has been well maintained and the  remainder of the sternum is well healed.  He has not started his rehab  program yet and he is driving and overall improving following surgery.   PHYSICAL EXAMINATION:  He is afebrile.  Heart rate is 84 and regular.  The sternum is stable and well healed.  The old packing is removed.  A  small sinus tract to the manubrium is cleaned with a Q-tip and partial  packing of a 2 x 2 gauze wet-to-dry is applied.  There is no evidence of  infection.   PLAN:  The patient will continue b.i.d. wet-to-dry dressing changes at  home.  Antibiotics are not necessary.  No prescriptions were provided at  this office visit.  I will plan on seeing the patient back in 1 week for  wound check.   Kerin Perna, M.D.  Electronically Signed   PV/MEDQ  D:  11/26/2009  T:  11/26/2009  Job:  [id number]   cc:   Loraine Leriche A. Perini, M.D.  Veverly Fells. Excell Seltzer, MD

## 2010-11-10 NOTE — Assessment & Plan Note (Signed)
OFFICE VISIT   Jeff [name], Jeff [name]  DOB:  1965/11/08                                        Nov 03, 2009  CHART #:  [id number]   REASON FOR OFFICE VISIT:  Sternal wound check.   HISTORY OF PRESENT ILLNESS:  This is a 45 year old Caucasian male who is  status post CABG x4 by Dr. Donata Clay on October 16, 2009.  Overall, the  patient has been doing fairly well since having been discharged from the  hospital on October 23, 2009.  His only complaint is that he has noticed  some erythema and a scab on the proximal portion of his sternal wound.  He states that when he was first discharged from the hospital, he did  have some occasional chills, however, has not had any chills or fever  since then.  He also denies any shortness of breath or chest pain.  He  does think, however, that he notices an occasional click that is mostly  present after coughing.  He has been coughing with greenish sputum  production.  Contacted his medical doctor, who has prescribed Augmentin  875 mg p.o. 2 times daily x1 week.  He presents today for further  evaluation of his sternal wound.   PHYSICAL EXAMINATION:  General:  This is a pleasant 45 year old  Caucasian male who is in no acute distress, who is alert, oriented, and  cooperative.  Vital Signs:  As follows; the patient is afebrile, heart  rate is 84, BP 114/63, respiratory rate 16, and O2 sat 95% on room air.  Chest:  His sternum appears stable.  His wound is well healed.  He does  have scabs both proximally and distally.  After removing the proximal  scab, the patient does have a small superficial area fluffy tissue, this  was debrided.  It is approximately the depth of half of a Q-tip head.  Normal saline with a small gauze followed by 4 x 4s and tape were placed  over the wound.  Skin:  A tinge of slight erythema along the skin edges,  however, no overt cellulitis.  Extremities:  Right lower extremity wound  has a scab present.  No  drainage.  No signs of infection.   IMPRESSION AND PLAN:  The patient has a tiny area of opening of the  proximal sternal wound (very superficial).  There is no overt erythema.  As previously stated, he was placed on Augmentin for greenish sputum  production.  He is to continue with these antibiotics.  We will not add  another antibiotic at this time.  The patient is encouraged to let the  shower water hit the area.  He may use soap and was instructed to use  normal saline on a small gauze, place it gently in the superficial area  of opening of the wound, apply dry 4 x 4s with tape and he is to change  this every day.  We will try to arrange for advanced home health care to  continue with the dressing changes every other day for this week and he  is to return to see our office on Monday, Nov 10, 2009.  He is to  contact the office if he develops any increased drainage, erythema,  fever, or chills in the interim.   Jeff [name], M.D.  Electronically  Signed   DZ/MEDQ  D:  11/03/2009  T:  11/04/2009  Job:  [id number]   cc:   Loraine Leriche A. Perini, M.D.  Learta Codding, MD,FACC

## 2010-11-10 NOTE — Assessment & Plan Note (Signed)
OFFICE VISIT   GRAYSON, PFEFFERLE  DOB:  Nov 12, 1965                                        Nov 21, 2009  CHART #:  [id number]   The patient returns for a wound check.  He is a type 1 diabetic who  underwent CABG in April and has a small area of nonhealing at the upper  sternal incision which is superficial above the sternum.  He has  completed a course of oral Keflex and is doing wet-to-dry dressings with  part of a 2 x 2 gauze every day.  There has been no drainage, fever, or  significant pain.  The remainder of the sternal incision is well healed.   Today, his heart rate is regular and he is in no distress and is  afebrile.  The sternum is stable.  The old dressing is removed.  Half a  2 x 2 gauze is replaced into this area which is clean, granulating, and  slowly healing in.  There is no surrounding cellulitis or fluctuance or  evidence of infection.   PLAN:  The patient will continue dressing changes with a small saline  wet-to-dry dressing and increased frequency to b.i.d. in order to try to  help the wound heal along more quickly.  I will plan on seeing him back  in 5 days for a wound check.  No new prescriptions were provided at this  office visit.   Kerin Perna, M.D.  Electronically Signed   PV/MEDQ  D:  11/21/2009  T:  11/22/2009  Job:  [id number]

## 2010-11-10 NOTE — Assessment & Plan Note (Signed)
[facility]                         GASTROENTEROLOGY OFFICE NOTE   [name], Jeff [name]                      MRN:          [id number]  DATE:06/26/2007                            DOB:          [birth year]    REFERRING PHYSICIAN:  Loraine Leriche A. Perini, M.D.   PRIMARY CARE PHYSICIAN:  Rodrigo Ran, MD.   REASON FOR REFERRAL:  Dr. Waynard Edwards asked me to evaluate [name] in  consultation regarding intermittent bright red blood per rectum.   HPI:  [name] is a pleasant 45 year old man who has intermittent red  rectal bleeding.  He says this has probably happened 4 times in the past  year.  He does move his bowels every other day fairly regularly,  sometimes he does have to strain.  He does not take aspirin, ibuprofen,  Aleve or Motrin or other NSAIDs.  He has never had colonoscopy before.  He normally does not have diarrhea.   REVIEW OF SYSTEMS:  Is notable for stable weights, otherwise essentially  normal except as on nursing intake sheet.   PAST MEDICAL HISTORY:  1. Diabetes (on an insulin pump).  2. Elevated cholesterol.  3. Arthritis.  4. Anxiety.  5. Depression.  6. Sleep apnea.  7. Neurofibromatosis.  8. History of seizures.   CURRENT MEDICINES:  1. Lisinopril.  2. Tegretol.  3. Vytorin 10/40.  4. Keppra.  5. Prozac.  6. Multivitamin.  7. NovoLog pump.   ALLERGIES:  No known drug allergies.   SOCIAL HISTORY:  1. Married.  2. Lives with his wife.  3. Unemployed.  4. Nonsmoker.  5. Nondrinker.   FAMILY HISTORY:  No colon cancer or colon polyps noted.   PHYSICAL EXAM:  Is 5 feet 11 inches, 234 pounds, blood pressure 120/68.  CONSTITUTIONAL:  Generally well-appearing.  NEUROLOGIC:  Alert and oriented x3.  EYES:  Extraocular movements intact.  MOUTH/OROPHARYNX:  Moist, no lesions.  NECK:  Supple, no lymphadenopathy.  CARDIOVASCULAR:  Heart regular rate and rhythm.  LUNGS:  Clear to auscultation bilaterally.  ABDOMEN:  Soft,  nontender, nondistended, normal bowel sounds.  EXTREMITIES:  No lower extremity edema.  He does have 5-6 cafe-au-lait  spots on his extremities.  SKIN:  No rashes.   ASSESSMENT AND PLAN:  A 45 year old man with intermittent bright red  blood per rectum.   [name] did not appear to be anemic clinically, but he will get a CBC  today just to be certain.  His red rectal bleeding seems fairly minor in  nature and is probably hemorrhoidal.  I will, however, arrange for him  to have a full colonoscopy, at his soonest convenience, to be certain  that he has no significant colonic findings.  I see no reason for any  further blood tests or imaging studies prior to then.     [name], MD  Electronically Signed    DPJ/MedQ  DD: 06/26/2007  DT: 06/26/2007  Job #: [id number]   cc:   Loraine Leriche A. Perini, M.D.

## 2010-11-10 NOTE — Letter (Signed)
April 23, 2010    To whom it may concern:   RE:  Jeff [name], Jeff [name]  MRN:  [id number]  /  DOB:  02-Oct-1965   Jeff [name] is a patient who I have followed since April 2011.  His social  security number is [id number].  The patient has a longstanding type 1  diabetes and presented with a non-ST-elevation myocardial infarction,  when he underwent cardiac catheterization demonstrating diffuse  multivessel coronary artery disease.  He was referred for coronary  bypass surgery and underwent four-vessel bypass on October 16, 2009.  Unfortunately, he developed recurrent class III angina and presented to  our office on September 7 with progressive chest pain with activity.  He  underwent repeat catheterization on September 8, demonstrating occlusion  of two of his vein grafts, occlusion of his left internal mammary graft,  and critical stenosis of his diagonal graft with a 99% lesion.  He then  underwent percutaneous intervention with stenting of his LAD and balloon  angioplasty of his diagonal branch on September 13.  The patient  presented again in October 2011, with chest pain in the setting of  diabetic ketoacidosis and he ruled for myocardial infarction.  He  underwent repeat catheterization on October 19, demonstrating patency of  his stents and patency of his balloon angioplasty site.   In summary, Jeff [name], has extensive coronary artery disease and failed  coronary bypass surgery.  He will have significant long-term limitation  from his advanced CAD.  I would be in strong support of full medical  disability based solely on his cardiac disease.   If there are any questions regarding this matter, please feel free to  contact me at any time.    Sincerely,      Veverly Fells. Excell Seltzer, MD  Electronically Signed    MDC/MedQ  DD: 04/23/2010  DT: 04/24/2010  Job #: [id number]   CC:    [employer]

## 2010-11-10 NOTE — Assessment & Plan Note (Signed)
OFFICE VISIT   JOB, HOLTSCLAW  DOB:  07-Dec-1965                                        December 03, 2009  CHART #:  [id number]   CURRENT PROBLEMS:  1. Superficial nonhealing of a short segment at the upper sternal      incision.  2. CABG x4 April 2011.  3. Type 1 diabetes mellitus.   HISTORY OF PRESENT ILLNESS:  The patient returns for a wound check.  His  wife has been doing b.i.d. wet-to-dry dressing changes at home with a 2  x 2 gauze.  The wound is granulating in.  He is not on antibiotics.  There has been no drainage.  He is walking a half hour a day.  He is  being followed also by Dr. Excell Seltzer in [facility] and has a  scheduled 2-D echo later this month.   PHYSICAL EXAMINATION:  The wound is examined carefully.  There is clean  granulation tissue.  This is very superficial involving the subcutaneous  layer only.  It is starting to contract and heal in.  Only about half of  a 2 x 2 gauze pad can be used now.   PLAN:  Continue b.i.d. wet-to-dry dressing changes.  I will see him back  in the [place] office in 5 days.   Kerin Perna, M.D.  Electronically Signed   PV/MEDQ  D:  12/03/2009  T:  12/04/2009  Job:  [id number]

## 2010-11-10 NOTE — Assessment & Plan Note (Signed)
OFFICE VISIT   Jeff [name], [name]  DOB:  September 28, 1965                                        Nov 10, 2009  CHART #:  [id number]   HISTORY:  The patient comes in today for a 3-week postoperative  followup.  He is status post coronary artery bypass grafting x4 by Dr.  Donata Clay on October 16, 2009.  He did return on Nov 03, 2009 with  superficial opening of the upper portion of his sternal incision.  Initially, he had some superficial separation of the skin, which  subsequently opened and began to drain a bit.  He denies any fevers, but  did have some chills initially when the incision opened.  He had been  placed on an antibiotic by his primary care physician for an upper  respiratory infection and had just begun this when he was seen in the  office last week.  At this point, he has finished with Augmentin.  He  has been doing daily wet-to-dry dressing changes to the wound.  He  states that he has had no further drainage from the area.  He has not  noted any erythema and denies any further fevers or chills.  Overall, he  does feel that he is improving slowly.  His appetite is still marginal,  but he is eating better than he did in the hospital.  Also, he had had  some previous issues with diarrhea prior to discharge home and this has  slowly improved since return home as well.  He is ambulating around the  house without problems.  He denies any shortness of breath.  He has had  minimal chest discomfort.  He is not taking any pain medication in the  past 2-3 days and prior to that had only taken once or twice a day as  needed.  He has not seen Dr. Excell [name] back in followup due to some  vacation issues in their office and does have an appointment for November 28, 2009 for followup there.  Overall, he feels that he is progressing well  and has had no other complaints.   PHYSICAL EXAMINATION:  Vital Signs:  Blood pressure is 115/75, pulse is  74 and regular, respirations 18,  and O2 sat 95% on room air.  Chest:  The lower portion of his sternal incision has healed well.  He does have  a small area of superficial separation at the upper portion of his  sternal incision about a centimeter to a centimeter and half in length,  which appears to be very clean and granulating well.  There is no  obvious drainage or surrounding erythema.  No areas of necrosis are  noted.  The sternum is stable to palpation.  Heart:  Regular rate and  rhythm without murmurs, rubs, or gallops.  Lungs:  Clear to  auscultation.  Extremities:  His right lower extremity incision has  healed well.  He has no significant lower extremity edema.   DIAGNOSTIC TESTS:  Chest x-ray is stable with only a tiny left pleural  effusion.   ASSESSMENT/PLAN:  Overall, the patient continued to progress status post  coronary artery bypass graft.  His sugars have been well controlled and  he has been seen in followup by Dr. Waynard Edwards regarding his insulin pump.  He has been contacted by cardiac  rehab and is interested in proceeding  with phase II, which I have encouraged.  I think at this point, he may  begin to drive and increase his activity as tolerated.  We will plan to  continue his daily dressing changes to sternal incision since it appears  to be healing well.  Also, Dr. Donata Clay will evaluate the patient and  check his wound later today and make any adjustments in his care that he  deems necessary.   Jeff [name], M.D.  Electronically Signed   GC/MEDQ  D:  11/10/2009  T:  11/11/2009  Job:  [id number]   cc:   Jeff Fells. Excell Seltzer, MD  Jeff [name], M.D.

## 2010-11-10 NOTE — Op Note (Signed)
Jeff [name], Jeff [name]               ACCOUNT NO.:  [id number]   MEDICAL RECORD NO.:  [id number]          PATIENT TYPE:  AMB   LOCATION:  DSC                          FACILITY:  [facility]   PHYSICIAN:  Katy Fitch. Sypher, M.D. DATE OF BIRTH:  12-02-1965   DATE OF PROCEDURE:  01/09/2008  DATE OF DISCHARGE:                               OPERATIVE REPORT   PREOPERATIVE DIAGNOSES:  Entrapped neuropathy, median nerve and left  carpal tunnel.   POSTOPERATIVE DIAGNOSES:  Entrapped neuropathy, median nerve and left  carpal tunnel.   OPERATION:  Release of left transverse carpal ligament.   OPERATING SURGEON:  Katy Fitch. Sypher, MD.   ASSISTANT:  Marveen Reeks. Dasnoit, PA-C.   ANESTHESIA:  IV regional supplemented by IV sedation.   SUPERVISING ANESTHESIOLOGIST:  Sheldon Silvan, MD.   INDICATIONS:  Jeff [name] is a 45 year old gentleman with multiple  medical problems including poorly controlled diabetes, hypertension, and  obsessive-compulsive disorder.  He was referred by Dr. Waynard Edwards for  evaluation and management of hand numbness.  Clinical examination  revealed signs of right carpal tunnel syndrome, left carpal tunnel  syndrome, and right ulnar neuropathy at the elbow.  Electrodiagnostic  studies confirmed significant entrapped neuropathy.   Due to a failed respond and nonoperative measures, he has previously  undergone decompression of his right carpal canal and ulnar nerve at the  cubital tunnel at the right elbow.  He now returns for release of the  left transverse carpal ligament to address his left carpal tunnel  syndrome.   After informed consent, Jeff [name] was brought to the operating room at  this time.   PROCEDURE:  Jeff [name] was brought to the operating room and  placed in supine position on the operating table.   Following light sedation and IV regional block, he was placed at the  proximal left brachial region utilizing a double-bladder tourniquet with  both bladders  elevated to 300 mmHg.   After 10 minutes, excellent anesthesia was achieved.   The left arm was prepped with Betadine soap and solution and sterilely  draped.  The procedure commenced with a short incision in the line of  the ring finger of the palm.  Subcutaneous tissue was carefully divided  and went through the palmar fascia.  This split longitudinally to the  common sensory branch of the median nerve.   Due to marked stiffness of the fingers as well as thickening of the  palmar fascia, the incision was extended proximally in another  centimeter to allow safe visualization of the palmar fascia and  transverse carpal ligament.   The median nerve and flexor tendons were gently isolated from the  transverse carpal ligament using a Agricultural engineer.  Once a channel  was created, the transcarpal ligament was released subcutaneously  extending into the distal forearm with scissors.   This widely opened carpal canal.  No mass or thickness were noted.  Bleeding points along the margin of the released ligament were  electrocauterized with bipolar current followed by repair of the skin  with intradermal through a Prolene suture.  A compressive dressings were applied with a volar plaster splint  maintaining the wrist in 5 degrees of dorsiflexion.   For aftercare, Jeff [name] was placed in a compressive dressing with a  volar plaster splint maintaining this 5 degrees dorsiflexion.  He was  given a prescription for Percocet 5 mg 1 p.o. q. 4-6 hours p.r.n. pain,  #20 tablets without refill.      Katy Fitch Sypher, M.D.  Electronically Signed     RVS/MEDQ  D:  01/09/2008  T:  01/09/2008  Job:  [id number]   cc:   Loraine Leriche A. Perini, M.D.

## 2010-11-10 NOTE — Assessment & Plan Note (Signed)
OFFICE VISIT   Jeff [name], Jeff [name]  DOB:  June 03, 1966                                        December 15, 2009  CHART #:  [id number]   HISTORY:  The patient returns for a wound check of his upper sternal  incision.  He is status post CABG x4 two months ago for multivessel  disease.  His a type 1 diabetic and he had a breakdown with some  superficial necrosis of the upper sternal incision, which is being  treated with wet-to-dry dressings twice day by his wife.  He is off  antibiotics and the area has been granulating nicely.  He is walking a  mile a day and he has no cardiac symptoms.  Otherwise, he is doing well.   PHYSICAL EXAMINATION:  Today, his blood pressure is 120/70, pulse 80 and  regular, and saturation on room air is 96%.  Breath sounds are clear and  sternum is stable and healing.  The area of nonhealing is now very  superficial and involves only the dermis.  It is less than a centimeter  long.  It has almost completely healed.   PLAN:  We will change to a Neosporin Band-Aid dressing and stop packing  the area.  He will return in 2 weeks for a final wound check.  We  discussed his activity limitations to now include driving, short day  trips, lifting up to 20 pounds, but no more and no exertional yard work  at this time.   Kerin Perna, M.D.  Electronically Signed   PV/MEDQ  D:  12/15/2009  T:  12/16/2009  Job:  [id number]

## 2010-11-10 NOTE — Assessment & Plan Note (Signed)
OFFICE VISIT   Jeff [name], Jeff [name]  DOB:  05-Oct-1965                                        December 08, 2009  CHART #:  [id number]   CURRENT PROBLEMS:  1. Status post CABG x4, April 2011.  2. Superficial nonhealing of the short segment of upper sternal      incision.  3. Type 1 diabetes mellitus.   PRESENT ILLNESS:  The patient returns for a wound check.  He has been  off antibiotics.  The wound is healing and granulating in with daily wet-  to-dry dressing changes using a 2 x 2 gauze.  This is being performed by  his wife.  He is walking.  His strength is improving.  The other  surgical incisions are healing well.   PHYSICAL EXAMINATION:  Vital Signs:  He is afebrile.  Oxygen saturation  90%, pulse 80, sinus, blood pressure 108/65.  General:  He is alert and  oriented.  Sternum is stable and healing.  The small area of nonhealing  is very clean, dry granulation tissue, which is very superficial and  shallow at this point.   PLAN:  Continue daily wet-to-dry dressing changes and wound check next  week.  At that point, we will probably  transition back to just a  Neosporin Band-Aid dressing.   Kerin Perna, M.D.  Electronically Signed   PV/MEDQ  D:  12/08/2009  T:  12/09/2009  Job:  [id number]

## 2010-11-10 NOTE — Assessment & Plan Note (Signed)
OFFICE VISIT   WATT, GEILER  DOB:  03/06/1966                                        Nov 17, 2009  CHART #:  [id number]   CURRENT PROBLEMS:  1. Status post coronary artery bypass graft x4, October 16, 2009.  2. Type 1 diabetes mellitus.  3. Nonhealing superior aspect of sternal incision, superficial.   PRESENT ILLNESS:  The patient is a 45 year old type 1 diabetic who  returns for a wound check.  He is having the upper portion of the  incision packed with a 2 x 2 wet-to-dry on a daily basis by his wife.  He has a completed a course of oral Keflex.  He has had no cultures  positive, and the sternum is stable.  His x-ray is clear and he has  maintained a sinus rhythm.  He has no peripheral edema and the leg  incision is healing.  He denies fever, and his sugars overall have been  very satisfactorily controlled.   PHYSICAL EXAMINATION:  Blood pressure 110/65, pulse 80 and regular.  He  is afebrile.  Breath sounds are clear and equal.  The sternal overall is  stable.  The upper portion has an opening in the subcutaneous tissue.  A  small Vicryl suture is exposed and this is trimmed and removed.  A Q-tip  goes down into the soft tissue above the sternum.  This area is clean  and repacked with part of a 2 x 2 gauze wet-to-dry.   PLAN:  The patient will continue daily dressing changes as instructed to  pack all the way to the depth of the wound which measures about the  death of the tip of the Q-tip.  I will give another 4 days of Keflex 500  b.i.d., and I will plan on seeing him back later this week on Nov 21, 2009, for wound check.   Kerin Perna, M.D.  Electronically Signed   PV/MEDQ  D:  11/17/2009  T:  11/18/2009  Job:  [id number]

## 2010-11-11 ENCOUNTER — Encounter (HOSPITAL_COMMUNITY): Payer: Self-pay

## 2010-11-13 ENCOUNTER — Encounter (HOSPITAL_COMMUNITY): Payer: Self-pay

## 2010-11-13 NOTE — Op Note (Signed)
Jeff [name], Jeff [name] NO.:  [id number]   MEDICAL RECORD NO.:  [id number]          PATIENT TYPE:  INP   LOCATION:                               FACILITY:  [facility]   PHYSICIAN:  Lucky Cowboy, MD         DATE OF BIRTH:  [birth year]   DATE OF PROCEDURE:  DATE OF DISCHARGE:  01/20/2008                               OPERATIVE REPORT   PREOPERATIVE DIAGNOSES:  1. Obstructive sleep apnea.  2. Inferior turbinate hypertrophy.  3. Tonsillar hypertrophy.   POSTOPERATIVE DIAGNOSES:  1. Obstructive sleep apnea.  2. Inferior turbinate hypertrophy.  3. Tonsillar hypertrophy.   PROCEDURES:  1. Tonsillectomy.  2. Uvulopalatopharyngoplasty.  3. Tongue base coblation.  4. Bilateral inferior turbinate reduction.   SURGEON:  Lucky Cowboy, MD   ANESTHESIA:  General.   SPECIMENS:  Bilateral palatine tonsils, inferior medial soft palate and  uvula.   ESTIMATED BLOOD LOSS:  50 mL.   COMPLICATIONS:  None.   INDICATIONS:  This patient is a 45 year old male with moderate  obstructive sleep apnea and seizure disorder.  He cannot tolerate the  CPAP machine.  He is having significant fatigue during the day.  He does  have his tonsils.  He has elongated soft palate and uvula along with  very prominent tongue base.  Has prominent inferior turbinate as well.  Thus, the procedures identified above are performed today to help him  reduce the level of obstruction.   PROCEDURE:  The patient was taken to the operating room and placed on  the table in the supine position.  He was placed under general  endotracheal anesthesia and the table rotated counterclockwise 90  degrees.  The head and body were draped in the usual sterile fashion.  Crowe-Davis mouthgag with a #4 tongue blade was placed intraorally,  opened and suspended on Mayo stand.  At this point, the right palatine  tonsil was grasped with Allis clamps directed inferomedially.  The Bovie  cautery was used in the cutting mode  additionally to divide the mucosa  of the anterior tonsillar pillar.  The underlying musculature and  vessels were divided using a mixture of cutting and coagulation mode.  Left palatine tonsil was removed in identical fashion.  The levator  dimple was identified by pressing and finding the contact point, the  midline of soft palate without any scratching of soft palate.  This was  then marked using Bovie cautery.  This was measured using a flexible  plastic sterile tape measure to ensure normal length range would be  taken for this area which was.  A square-type excision of the anterior  tonsillar pillars bilaterally connecting with the mid and inferior soft  palate and removing the uvula was then performed in the standard and  typical fashion again with the mixture/blend of a coagulation and  cutting mode.  The midline part of the excision was beveled so as to  leave additional mucosa on the posterior surface which was then be  closed in a simple interrupted fashion using 4-0 Vicryl along the wall  edges.  This  then was used in the same fashion to close the anterior and  posterior tonsillar pillars.  Once this was performed, the table was  rotated clockwise to its original position after removing the mouthgag  and suctioning out the stomach.  Each of the nasal cavities was  decongested with Afrin cottonoid pledget.  The 1% lidocaine with  1:100,000 of epinephrine was used to inject both of the inferior  turbinates.  After allowing time for vasoconstrictive effect,  microdebrider was used to remove the inferior one-half of the mucosa  overlying the bony turbinate.  The through-cut forceps was then used to  remove exposed bone.  This was then suction cauterized and nasal cavity  packed with bacitracin coated Telfa.   Please note that coblation was then performed.  The oral cavity was  opened using a dental block between the posterior incisors.  This was  small in size.  The tongue was  grasped with a towel clip and reflected  anterior.  The coblation tongue base wand was used suggested by the  company, just anterior to the circumvallate papillae and posterior to  the circumvallate papillae in a transverse/horizontal fashion were  utilized.  There was no unexpected effect or a change in the appearance  of the tongue as would be expected.  The patient was then awakened from  anesthesia and taken to the post anesthesia care in stable condition.  There were no complications.      Lucky Cowboy, MD  Electronically Signed     SJ/MEDQ  D:  04/13/2008  T:  04/14/2008  Job:  [id number]   cc:   [facility]  Loraine Leriche A. Perini, M.D.

## 2010-11-16 ENCOUNTER — Encounter (HOSPITAL_COMMUNITY): Payer: Self-pay

## 2010-11-18 ENCOUNTER — Encounter: Payer: Self-pay | Admitting: Cardiovascular Disease

## 2010-11-18 ENCOUNTER — Encounter (HOSPITAL_COMMUNITY): Payer: Self-pay

## 2010-11-20 ENCOUNTER — Encounter (HOSPITAL_COMMUNITY): Payer: Self-pay

## 2010-11-23 ENCOUNTER — Encounter (HOSPITAL_COMMUNITY): Payer: Self-pay

## 2010-11-24 ENCOUNTER — Encounter: Payer: Self-pay | Admitting: Cardiovascular Disease

## 2010-11-24 ENCOUNTER — Ambulatory Visit (INDEPENDENT_AMBULATORY_CARE_PROVIDER_SITE_OTHER): Payer: BC Managed Care – PPO | Admitting: Cardiovascular Disease

## 2010-11-24 VITALS — BP 104/70 | HR 82 | Resp 18 | Ht 71.0 in | Wt 231.1 lb

## 2010-11-24 DIAGNOSIS — E7800 Pure hypercholesterolemia, unspecified: Secondary | ICD-10-CM

## 2010-11-24 DIAGNOSIS — I25810 Atherosclerosis of coronary artery bypass graft(s) without angina pectoris: Secondary | ICD-10-CM

## 2010-11-24 DIAGNOSIS — E785 Hyperlipidemia, unspecified: Secondary | ICD-10-CM

## 2010-11-24 NOTE — Patient Instructions (Addendum)
Your physician wants you to follow-up in: 6 MONTHS. You will receive a reminder letter in the mail two months in advance. If you don't receive a letter, please call our office to schedule the follow-up appointment.  Your physician has recommended you make the following change in your medication: DECREASE Vytorin to 10/40mg  one daily  Please have cholesterol lab work followed up with your Primary Care Provider.

## 2010-11-24 NOTE — Progress Notes (Signed)
HPI:  This is a 45 year old gentleman presenting for followup evaluation. The patient has long-standing diabetes maintained on an insulin pump. He has extensive multivessel CAD and underwent coronary bypass surgery in 2011. The patient had early graft failure and required multivessel stenting following his bypass surgery.  His main complaint is generalized fatigue. He notes a general lack of energy. He is able to participate in cardiac rehabilitation and can complete 30 minutes of exercise but he is tired by the third 10 minutes exercise. He denies exertional chest pain or dyspnea. He denies edema, orthopnea, PND, or palpitations.  Outpatient Encounter Prescriptions as of 11/24/2010  Medication Sig Dispense Refill  . acetaminophen (TYLENOL) 325 MG tablet Take 650 mg by mouth every 6 (six) hours as needed.        [name] aspirin 81 MG tablet Take 81 mg by mouth daily.        . carbamazepine (TEGRETOL) 200 MG tablet 3 tablets two times a day       . docusate sodium (COLACE) 100 MG capsule Take 100 mg by mouth daily.        [name] ezetimibe-simvastatin (VYTORIN) 10-80 MG per tablet Take 1 tablet by mouth at bedtime.        . insulin aspart (NOVOLOG) 100 UNIT/ML injection Inject into the skin. Insulin pump. Use as directed       . levETIRAcetam (KEPPRA) 500 MG tablet Take 1/2 tablet in the morning and 1 tablet in the evening       . lisinopril (PRINIVIL,ZESTRIL) 20 MG tablet take 1 tablet by mouth once daily  30 tablet  6  . metoprolol tartrate (LOPRESSOR) 25 MG tablet Take 1 tablet (25 mg total) by mouth 2 (two) times daily.  60 tablet  5  . multivitamin (ONE-A-DAY MEN'S) TABS Take 1 tablet by mouth daily.        . prasugrel (EFFIENT) 10 MG TABS Take 10 mg by mouth daily.        . sertraline (ZOLOFT) 100 MG tablet Take 100 mg by mouth 2 (two) times daily.        [name] DISCONTD: insulin aspart (NOVOLOG) 100 UNIT/ML injection Use as directed        No Known Allergies  Past Medical History  Diagnosis Date  . CAD  (coronary artery disease)     s/p CABG, NSTEMI  . Diabetes mellitus type I     on insulin pump at  home  . Sleep apnea     s/p oral surgery  . Hyperlipidemia   . 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 Diabetic retinopathy     s/p vitrectomy and history of retinal surgery  . HTN (hypertension)   . Seizure disorder   . Seasonal allergic rhinitis   . Depression     ROS: Negative except as per HPI  BP 104/70  Pulse 82  Resp 18  Ht 5\' 11"  (1.803 m)  Wt 231 lb 1.9 oz (104.835 kg)  BMI 32.23 kg/m2  PHYSICAL EXAM: Pt is alert and oriented, overweight male in NAD HEENT: normal Neck: JVP - normal, carotids 2+= without bruits Lungs: CTA bilaterally CV: RRR without murmur or gallop Abd: soft, NT, Positive BS, no hepatomegaly Ext: no C/C/E, distal pulses intact and equal Skin: warm/dry no rash  EKG:  Normal sinus rhythm 82 beats per minute, mild inferior ST segment elevation unchanged from previous tracings.  ASSESSMENT AND PLAN:

## 2010-11-25 ENCOUNTER — Encounter (HOSPITAL_COMMUNITY): Payer: Self-pay

## 2010-11-27 ENCOUNTER — Encounter (HOSPITAL_COMMUNITY): Payer: Self-pay | Attending: Cardiovascular Disease

## 2010-11-27 DIAGNOSIS — I252 Old myocardial infarction: Secondary | ICD-10-CM | POA: Insufficient documentation

## 2010-11-27 DIAGNOSIS — G4733 Obstructive sleep apnea (adult) (pediatric): Secondary | ICD-10-CM | POA: Insufficient documentation

## 2010-11-27 DIAGNOSIS — I498 Other specified cardiac arrhythmias: Secondary | ICD-10-CM | POA: Insufficient documentation

## 2010-11-27 DIAGNOSIS — I10 Essential (primary) hypertension: Secondary | ICD-10-CM | POA: Insufficient documentation

## 2010-11-27 DIAGNOSIS — Z5189 Encounter for other specified aftercare: Secondary | ICD-10-CM | POA: Insufficient documentation

## 2010-11-27 DIAGNOSIS — Z79899 Other long term (current) drug therapy: Secondary | ICD-10-CM | POA: Insufficient documentation

## 2010-11-27 DIAGNOSIS — E785 Hyperlipidemia, unspecified: Secondary | ICD-10-CM | POA: Insufficient documentation

## 2010-11-27 DIAGNOSIS — F341 Dysthymic disorder: Secondary | ICD-10-CM | POA: Insufficient documentation

## 2010-11-27 DIAGNOSIS — I319 Disease of pericardium, unspecified: Secondary | ICD-10-CM | POA: Insufficient documentation

## 2010-11-27 DIAGNOSIS — I2510 Atherosclerotic heart disease of native coronary artery without angina pectoris: Secondary | ICD-10-CM | POA: Insufficient documentation

## 2010-11-27 DIAGNOSIS — Z794 Long term (current) use of insulin: Secondary | ICD-10-CM | POA: Insufficient documentation

## 2010-11-27 DIAGNOSIS — E109 Type 1 diabetes mellitus without complications: Secondary | ICD-10-CM | POA: Insufficient documentation

## 2010-11-27 DIAGNOSIS — Q8500 Neurofibromatosis, unspecified: Secondary | ICD-10-CM | POA: Insufficient documentation

## 2010-11-27 DIAGNOSIS — Z951 Presence of aortocoronary bypass graft: Secondary | ICD-10-CM | POA: Insufficient documentation

## 2010-11-27 DIAGNOSIS — Z9641 Presence of insulin pump (external) (internal): Secondary | ICD-10-CM | POA: Insufficient documentation

## 2010-11-30 ENCOUNTER — Encounter (HOSPITAL_COMMUNITY): Payer: Self-pay

## 2010-12-02 ENCOUNTER — Encounter (HOSPITAL_COMMUNITY): Payer: Self-pay

## 2010-12-04 ENCOUNTER — Encounter (HOSPITAL_COMMUNITY): Payer: Self-pay

## 2010-12-07 ENCOUNTER — Encounter (HOSPITAL_COMMUNITY): Payer: Self-pay

## 2010-12-09 ENCOUNTER — Encounter (HOSPITAL_COMMUNITY): Payer: Self-pay

## 2010-12-11 ENCOUNTER — Encounter (HOSPITAL_COMMUNITY): Payer: Self-pay

## 2010-12-11 ENCOUNTER — Encounter: Payer: Self-pay | Admitting: Cardiovascular Disease

## 2010-12-11 NOTE — Assessment & Plan Note (Signed)
Most recent lipids from [facility] was reviewed. Cholesterol 123, triglycerides 77, HDL 35, LDL 73. The patient would like to reduce his Vytorin from 10/80 mg to 10/40 mg so that he can get samples. I told him that it felt like this was fine and he will have followup lipids drawn by Dr. Waynard Edwards in a few months.

## 2010-12-11 NOTE — Assessment & Plan Note (Signed)
The patient is stable without angina.  He is on an appropriate medical regimen with aspirin, effient, metoprolol, lisinopril, and a statin. I have recommended followup in 6 months.

## 2010-12-14 ENCOUNTER — Encounter (HOSPITAL_COMMUNITY): Payer: Self-pay

## 2010-12-16 ENCOUNTER — Encounter (HOSPITAL_COMMUNITY): Payer: Self-pay

## 2010-12-17 ENCOUNTER — Ambulatory Visit
Admission: RE | Admit: 2010-12-17 | Discharge: 2010-12-17 | Disposition: A | Discharge status: Home / Self Care | Payer: BC Managed Care – PPO | Source: Ambulatory Visit | Attending: Family Medicine | Admitting: Family Medicine

## 2010-12-17 ENCOUNTER — Ambulatory Visit (INDEPENDENT_AMBULATORY_CARE_PROVIDER_SITE_OTHER): Payer: BC Managed Care – PPO | Admitting: Family Medicine

## 2010-12-17 ENCOUNTER — Encounter: Payer: Self-pay | Admitting: Family Medicine

## 2010-12-17 VITALS — BP 122/75 | Ht 71.0 in | Wt 230.0 lb

## 2010-12-17 DIAGNOSIS — M79609 Pain in unspecified limb: Secondary | ICD-10-CM

## 2010-12-17 DIAGNOSIS — M79605 Pain in left leg: Secondary | ICD-10-CM

## 2010-12-17 NOTE — Progress Notes (Signed)
  Subjective:    Patient ID: Jeff [name], male    DOB: 1966-06-14, 45 y.o.   MRN: [id number]  HPI Jeff [name] is a new patient to the [facility]  He has a history of CAD s/p CABG, Diabetes, HLDHe is here for L lateral leg pain x 5-6 years which has progressed during the last couple of months ago.  Pain is located on the lateral aspect of the left knee about 2-3 inches proximal and distal to the patella.  He has been participating in cardiac rehab at [facility] since July 2011 s/p PCI.  He describes L leg pain with stepping down movement of walking.  He has pain when walking down stairs or standing up from a sitting position.  Pain feels like sharp shooting pain.  He can walk on the treadmill or on the stepper during cardiac rehab without pain.  He is not able to cross the left knee as well as he can with the right knee.  For example, when flexing the right knee to put on socks she can flex it better than the left side.   He also has left leg pain during sleep that prevents him from falling asleep.  This pain is also in the left leg and it feels like a tingling burning sensation.  Night time leg pain is intermittent.   Review of Systems     Objective:   Physical Exam        Assessment & Plan:

## 2010-12-17 NOTE — Assessment & Plan Note (Addendum)
Left posterior thigh/knee pain was significantly decreased range of motion of his left hip - [facility] ultrasound of the knee and hamstring tendons revealed no acute abnormalities - Will check x-ray of his left hip AP pelvis and lateral to evaluate for any arthritis as she has significant restriction of range of motion of his left hip. Suspect restricted range of motion is contributing to altered gait mechanics and stress in the posterior aspect of his knee which is causing his pain. - Will call him at [phone] (h) or [phone] (c) to discuss x-ray results and further treatment options.

## 2010-12-17 NOTE — Progress Notes (Signed)
  Subjective:    Patient ID: Jeff [name], male    DOB: 1966/02/28, 45 y.o.   MRN: [id number]  HPI  Jeff [name] is a new patient to the [facility].  He has a history of CAD s/p CABG, Diabetes (with insulin pump), hyperlipidemia.  He is here for Lt leg pain x 5-6 years which has progressed during the last couple of months ago.  Pain is located on the posterior-lateral aspect of the left knee & extending about 2-3 inches proximally and distally from the knee joint.  He has been participating in cardiac rehab at [facility] since July 2011 s/p PCI.  Pain most prominent when he tries to take initial step after sitting for long periods of time, pain improves as he is more active. Pain initially feels like a sharp, shooting pain, denies any associated numbness or tingling. Does have some stiffness in his knee as well.  He has pain when walking down stairs or standing up from a sitting position.  He can walk on the treadmill or on the stepper during cardiac rehab without pain.  He notes difficulty trying to put on his left shoe as he is unable to cross his legs on the left as well as he can the right. Occasional pain in the leg at night when trying to sleep.  Is not taking anything for the pain at this time.  Past medical history, past surgical history, medications, allergies, social history reviewed in the electronic medical record and updated.  .Review of Systems  per history of present illness, otherwise negative    Objective:   Physical Exam  GENERAL: 45 year old male, alert and oriented x3, no acute distress, appears much older than his stated age SKIN: No rashes or lesions RESPIRATORY: Respirations 15 and unlabored MSK: - Hips: Right hip with full range of motion without pain, negative logroll, good hip strength. Left hip with significantly decreased range of motion with external rotation of 30 and internal rotation of 30, negative logroll, good hip strength. No tenderness to palpation along the groin or  greater trochanter bilaterally. - Knees: Full range of motion bilaterally without pain. No swelling or effusion. No tenderness over the joint lines, patellar tendon, quadriceps tendon. Negative McMurray, no ligamentous laxity bilaterally. Mild tenderness to palpation along distal hamstring tendon on the left, no palpable defects and excellent concentric & eccentric strength bilaterally. He is noted to have slight calf atrophy on the right compared to the left. - Back: No tenderness to palpation. Good range of motion. No scoliosis. Good lower trace strength. He is noted to have negative straight leg raise bilaterally. Tight hamstrings bilaterally, more prominent on left compared to the right. - Gait: No leg length difference. Walks slightly supinated with a stiff gait. Inhibited leg swing on the left compared to the right.  NEURO: Sensation intact to light touch, DTR +2/4 Achilles and patella bilaterally VASCULAR: PULSES 2+ AND SYMMETRIC.        Assessment & Plan:

## 2010-12-18 ENCOUNTER — Encounter (HOSPITAL_COMMUNITY): Payer: Self-pay

## 2010-12-21 ENCOUNTER — Telehealth: Payer: Self-pay | Admitting: Family Medicine

## 2010-12-21 ENCOUNTER — Encounter (HOSPITAL_COMMUNITY): Payer: Self-pay

## 2010-12-21 NOTE — Telephone Encounter (Signed)
Spoke to pt on phone 12/21/10 @ 12:50pm.  Called to discuss recent hip x-rays which were normal.  Suspect current pain in left leg related to tight muscles and altered gait.  Will refer him to physical therapy for this.  Should continue with his cardiac rehab as he is doing.  Follow-up with [name] in 4-6 weeks for re-evaluation.  Pt understands & agrees with above plan, will call with any questions or concerns.

## 2010-12-22 ENCOUNTER — Other Ambulatory Visit: Payer: Self-pay | Admitting: *Deleted

## 2010-12-22 DIAGNOSIS — M79605 Pain in left leg: Secondary | ICD-10-CM

## 2010-12-23 ENCOUNTER — Encounter (HOSPITAL_COMMUNITY): Payer: Self-pay

## 2010-12-25 ENCOUNTER — Encounter (HOSPITAL_COMMUNITY): Payer: Self-pay

## 2010-12-28 ENCOUNTER — Encounter (HOSPITAL_COMMUNITY): Payer: Self-pay | Attending: Cardiovascular Disease

## 2010-12-28 DIAGNOSIS — Z5189 Encounter for other specified aftercare: Secondary | ICD-10-CM | POA: Insufficient documentation

## 2010-12-28 DIAGNOSIS — I319 Disease of pericardium, unspecified: Secondary | ICD-10-CM | POA: Insufficient documentation

## 2010-12-28 DIAGNOSIS — I498 Other specified cardiac arrhythmias: Secondary | ICD-10-CM | POA: Insufficient documentation

## 2010-12-28 DIAGNOSIS — Z794 Long term (current) use of insulin: Secondary | ICD-10-CM | POA: Insufficient documentation

## 2010-12-28 DIAGNOSIS — F341 Dysthymic disorder: Secondary | ICD-10-CM | POA: Insufficient documentation

## 2010-12-28 DIAGNOSIS — Z9641 Presence of insulin pump (external) (internal): Secondary | ICD-10-CM | POA: Insufficient documentation

## 2010-12-28 DIAGNOSIS — Z79899 Other long term (current) drug therapy: Secondary | ICD-10-CM | POA: Insufficient documentation

## 2010-12-28 DIAGNOSIS — I10 Essential (primary) hypertension: Secondary | ICD-10-CM | POA: Insufficient documentation

## 2010-12-28 DIAGNOSIS — E109 Type 1 diabetes mellitus without complications: Secondary | ICD-10-CM | POA: Insufficient documentation

## 2010-12-28 DIAGNOSIS — G4733 Obstructive sleep apnea (adult) (pediatric): Secondary | ICD-10-CM | POA: Insufficient documentation

## 2010-12-28 DIAGNOSIS — Z951 Presence of aortocoronary bypass graft: Secondary | ICD-10-CM | POA: Insufficient documentation

## 2010-12-28 DIAGNOSIS — I252 Old myocardial infarction: Secondary | ICD-10-CM | POA: Insufficient documentation

## 2010-12-28 DIAGNOSIS — E785 Hyperlipidemia, unspecified: Secondary | ICD-10-CM | POA: Insufficient documentation

## 2010-12-28 DIAGNOSIS — Q8500 Neurofibromatosis, unspecified: Secondary | ICD-10-CM | POA: Insufficient documentation

## 2010-12-28 DIAGNOSIS — I2510 Atherosclerotic heart disease of native coronary artery without angina pectoris: Secondary | ICD-10-CM | POA: Insufficient documentation

## 2010-12-30 ENCOUNTER — Encounter (HOSPITAL_COMMUNITY): Payer: Self-pay

## 2011-01-01 ENCOUNTER — Encounter (HOSPITAL_COMMUNITY): Payer: Self-pay

## 2011-01-04 ENCOUNTER — Encounter (HOSPITAL_COMMUNITY): Payer: Self-pay

## 2011-01-06 ENCOUNTER — Encounter (HOSPITAL_COMMUNITY): Payer: Self-pay

## 2011-01-08 ENCOUNTER — Encounter (HOSPITAL_COMMUNITY): Payer: Self-pay

## 2011-01-11 ENCOUNTER — Encounter (HOSPITAL_COMMUNITY): Payer: Self-pay

## 2011-01-12 ENCOUNTER — Ambulatory Visit: Payer: BC Managed Care – PPO | Attending: Family Medicine

## 2011-01-12 DIAGNOSIS — IMO0001 Reserved for inherently not codable concepts without codable children: Secondary | ICD-10-CM | POA: Insufficient documentation

## 2011-01-12 DIAGNOSIS — R5381 Other malaise: Secondary | ICD-10-CM | POA: Insufficient documentation

## 2011-01-12 DIAGNOSIS — M2560 Stiffness of unspecified joint, not elsewhere classified: Secondary | ICD-10-CM | POA: Insufficient documentation

## 2011-01-12 DIAGNOSIS — M2550 Pain in unspecified joint: Secondary | ICD-10-CM | POA: Insufficient documentation

## 2011-01-13 ENCOUNTER — Encounter (HOSPITAL_COMMUNITY): Payer: Self-pay

## 2011-01-15 ENCOUNTER — Encounter (HOSPITAL_COMMUNITY): Payer: Self-pay

## 2011-01-18 ENCOUNTER — Encounter (HOSPITAL_COMMUNITY): Payer: Self-pay

## 2011-01-19 ENCOUNTER — Ambulatory Visit: Payer: BC Managed Care – PPO | Admitting: Physical Therapy

## 2011-01-20 ENCOUNTER — Encounter (HOSPITAL_COMMUNITY): Payer: Self-pay

## 2011-01-21 ENCOUNTER — Ambulatory Visit: Payer: BC Managed Care – PPO | Admitting: Physical Therapy

## 2011-01-22 ENCOUNTER — Encounter (HOSPITAL_COMMUNITY): Payer: Self-pay

## 2011-01-25 ENCOUNTER — Encounter (HOSPITAL_COMMUNITY): Payer: Self-pay

## 2011-01-26 ENCOUNTER — Ambulatory Visit: Payer: BC Managed Care – PPO | Admitting: Physical Therapy

## 2011-01-27 ENCOUNTER — Encounter (HOSPITAL_COMMUNITY): Payer: Self-pay | Attending: Cardiovascular Disease

## 2011-01-27 DIAGNOSIS — Z951 Presence of aortocoronary bypass graft: Secondary | ICD-10-CM | POA: Insufficient documentation

## 2011-01-27 DIAGNOSIS — E109 Type 1 diabetes mellitus without complications: Secondary | ICD-10-CM | POA: Insufficient documentation

## 2011-01-27 DIAGNOSIS — F341 Dysthymic disorder: Secondary | ICD-10-CM | POA: Insufficient documentation

## 2011-01-27 DIAGNOSIS — Z5189 Encounter for other specified aftercare: Secondary | ICD-10-CM | POA: Insufficient documentation

## 2011-01-27 DIAGNOSIS — Z9641 Presence of insulin pump (external) (internal): Secondary | ICD-10-CM | POA: Insufficient documentation

## 2011-01-27 DIAGNOSIS — I252 Old myocardial infarction: Secondary | ICD-10-CM | POA: Insufficient documentation

## 2011-01-27 DIAGNOSIS — Q8500 Neurofibromatosis, unspecified: Secondary | ICD-10-CM | POA: Insufficient documentation

## 2011-01-27 DIAGNOSIS — Z79899 Other long term (current) drug therapy: Secondary | ICD-10-CM | POA: Insufficient documentation

## 2011-01-27 DIAGNOSIS — G4733 Obstructive sleep apnea (adult) (pediatric): Secondary | ICD-10-CM | POA: Insufficient documentation

## 2011-01-27 DIAGNOSIS — E785 Hyperlipidemia, unspecified: Secondary | ICD-10-CM | POA: Insufficient documentation

## 2011-01-27 DIAGNOSIS — I10 Essential (primary) hypertension: Secondary | ICD-10-CM | POA: Insufficient documentation

## 2011-01-27 DIAGNOSIS — Z794 Long term (current) use of insulin: Secondary | ICD-10-CM | POA: Insufficient documentation

## 2011-01-27 DIAGNOSIS — I319 Disease of pericardium, unspecified: Secondary | ICD-10-CM | POA: Insufficient documentation

## 2011-01-27 DIAGNOSIS — I2510 Atherosclerotic heart disease of native coronary artery without angina pectoris: Secondary | ICD-10-CM | POA: Insufficient documentation

## 2011-01-27 DIAGNOSIS — I498 Other specified cardiac arrhythmias: Secondary | ICD-10-CM | POA: Insufficient documentation

## 2011-01-28 ENCOUNTER — Ambulatory Visit: Payer: BC Managed Care – PPO | Attending: Family Medicine | Admitting: Physical Therapy

## 2011-01-28 DIAGNOSIS — M2550 Pain in unspecified joint: Secondary | ICD-10-CM | POA: Insufficient documentation

## 2011-01-28 DIAGNOSIS — M2560 Stiffness of unspecified joint, not elsewhere classified: Secondary | ICD-10-CM | POA: Insufficient documentation

## 2011-01-28 DIAGNOSIS — R5381 Other malaise: Secondary | ICD-10-CM | POA: Insufficient documentation

## 2011-01-28 DIAGNOSIS — IMO0001 Reserved for inherently not codable concepts without codable children: Secondary | ICD-10-CM | POA: Insufficient documentation

## 2011-01-29 ENCOUNTER — Encounter (HOSPITAL_COMMUNITY): Payer: Self-pay

## 2011-02-01 ENCOUNTER — Encounter (HOSPITAL_COMMUNITY): Payer: Self-pay

## 2011-02-02 ENCOUNTER — Ambulatory Visit: Payer: BC Managed Care – PPO | Admitting: Physical Therapy

## 2011-02-03 ENCOUNTER — Encounter (HOSPITAL_COMMUNITY): Payer: Self-pay

## 2011-02-04 ENCOUNTER — Ambulatory Visit: Payer: BC Managed Care – PPO | Admitting: Physical Therapy

## 2011-02-05 ENCOUNTER — Encounter (HOSPITAL_COMMUNITY): Payer: Self-pay

## 2011-02-08 ENCOUNTER — Encounter (HOSPITAL_COMMUNITY): Payer: Self-pay

## 2011-02-09 ENCOUNTER — Ambulatory Visit: Payer: BC Managed Care – PPO | Admitting: Physical Therapy

## 2011-02-10 ENCOUNTER — Encounter (HOSPITAL_COMMUNITY): Payer: Self-pay

## 2011-02-11 ENCOUNTER — Ambulatory Visit: Payer: BC Managed Care – PPO | Admitting: Physical Therapy

## 2011-02-12 ENCOUNTER — Encounter (HOSPITAL_COMMUNITY): Payer: Self-pay

## 2011-02-15 ENCOUNTER — Encounter (HOSPITAL_COMMUNITY): Payer: Self-pay

## 2011-02-16 ENCOUNTER — Ambulatory Visit: Payer: BC Managed Care – PPO | Admitting: Physical Therapy

## 2011-02-17 ENCOUNTER — Encounter (HOSPITAL_COMMUNITY): Payer: Self-pay

## 2011-02-17 ENCOUNTER — Other Ambulatory Visit: Payer: Self-pay | Admitting: *Deleted

## 2011-02-17 MED ORDER — PRASUGREL HCL 10 MG PO TABS: 10.0000 mg | ORAL_TABLET | Freq: Every day | ORAL | Status: DC

## 2011-02-18 ENCOUNTER — Encounter: Payer: BC Managed Care – PPO | Admitting: Physical Therapy

## 2011-02-19 ENCOUNTER — Encounter (HOSPITAL_COMMUNITY): Payer: Self-pay

## 2011-02-19 ENCOUNTER — Telehealth: Payer: Self-pay | Admitting: Cardiovascular Disease

## 2011-02-19 NOTE — Telephone Encounter (Signed)
Pt would like to know is able to obtain another effient discount card

## 2011-02-19 NOTE — Telephone Encounter (Signed)
LMOVM Placed at front desk Mylo Red RN

## 2011-02-22 ENCOUNTER — Encounter (HOSPITAL_COMMUNITY): Payer: Self-pay

## 2011-02-23 ENCOUNTER — Ambulatory Visit: Payer: BC Managed Care – PPO | Admitting: Physical Therapy

## 2011-02-24 ENCOUNTER — Encounter (HOSPITAL_COMMUNITY): Payer: Self-pay

## 2011-02-25 ENCOUNTER — Encounter: Payer: BC Managed Care – PPO | Admitting: Physical Therapy

## 2011-02-26 ENCOUNTER — Encounter (HOSPITAL_COMMUNITY): Payer: Self-pay

## 2011-03-01 ENCOUNTER — Encounter (HOSPITAL_COMMUNITY): Payer: Self-pay | Attending: Cardiovascular Disease

## 2011-03-01 DIAGNOSIS — Z9641 Presence of insulin pump (external) (internal): Secondary | ICD-10-CM | POA: Insufficient documentation

## 2011-03-01 DIAGNOSIS — Z5189 Encounter for other specified aftercare: Secondary | ICD-10-CM | POA: Insufficient documentation

## 2011-03-01 DIAGNOSIS — F341 Dysthymic disorder: Secondary | ICD-10-CM | POA: Insufficient documentation

## 2011-03-01 DIAGNOSIS — E109 Type 1 diabetes mellitus without complications: Secondary | ICD-10-CM | POA: Insufficient documentation

## 2011-03-01 DIAGNOSIS — G4733 Obstructive sleep apnea (adult) (pediatric): Secondary | ICD-10-CM | POA: Insufficient documentation

## 2011-03-01 DIAGNOSIS — Z951 Presence of aortocoronary bypass graft: Secondary | ICD-10-CM | POA: Insufficient documentation

## 2011-03-01 DIAGNOSIS — I2510 Atherosclerotic heart disease of native coronary artery without angina pectoris: Secondary | ICD-10-CM | POA: Insufficient documentation

## 2011-03-01 DIAGNOSIS — Z79899 Other long term (current) drug therapy: Secondary | ICD-10-CM | POA: Insufficient documentation

## 2011-03-01 DIAGNOSIS — Q8500 Neurofibromatosis, unspecified: Secondary | ICD-10-CM | POA: Insufficient documentation

## 2011-03-01 DIAGNOSIS — I252 Old myocardial infarction: Secondary | ICD-10-CM | POA: Insufficient documentation

## 2011-03-01 DIAGNOSIS — Z794 Long term (current) use of insulin: Secondary | ICD-10-CM | POA: Insufficient documentation

## 2011-03-01 DIAGNOSIS — I498 Other specified cardiac arrhythmias: Secondary | ICD-10-CM | POA: Insufficient documentation

## 2011-03-01 DIAGNOSIS — E785 Hyperlipidemia, unspecified: Secondary | ICD-10-CM | POA: Insufficient documentation

## 2011-03-01 DIAGNOSIS — I10 Essential (primary) hypertension: Secondary | ICD-10-CM | POA: Insufficient documentation

## 2011-03-01 DIAGNOSIS — I319 Disease of pericardium, unspecified: Secondary | ICD-10-CM | POA: Insufficient documentation

## 2011-03-03 ENCOUNTER — Encounter (HOSPITAL_COMMUNITY): Payer: Self-pay

## 2011-03-05 ENCOUNTER — Encounter (HOSPITAL_COMMUNITY): Payer: Self-pay

## 2011-03-08 ENCOUNTER — Encounter (HOSPITAL_COMMUNITY): Payer: Self-pay

## 2011-03-10 ENCOUNTER — Encounter (HOSPITAL_COMMUNITY): Payer: Self-pay

## 2011-03-12 ENCOUNTER — Encounter (HOSPITAL_COMMUNITY): Payer: Self-pay

## 2011-03-15 ENCOUNTER — Encounter (HOSPITAL_COMMUNITY): Payer: Self-pay

## 2011-03-17 ENCOUNTER — Encounter (HOSPITAL_COMMUNITY): Payer: Self-pay

## 2011-03-19 ENCOUNTER — Encounter (HOSPITAL_COMMUNITY): Payer: Self-pay

## 2011-03-22 ENCOUNTER — Encounter (HOSPITAL_COMMUNITY): Payer: Self-pay

## 2011-03-24 ENCOUNTER — Encounter (HOSPITAL_COMMUNITY): Payer: Self-pay

## 2011-03-24 LAB — BASIC METABOLIC PANEL
CO2: 26
Calcium: 9.2
Creatinine, Ser: 0.59
GFR calc Af Amer: >60
Glucose, Bld: 265 — ABNORMAL HIGH

## 2011-03-25 LAB — BASIC METABOLIC PANEL
BUN: 6
Calcium: 8.7
GFR calc non Af Amer: >60
Glucose, Bld: 289 — ABNORMAL HIGH
Sodium: 134 — ABNORMAL LOW

## 2011-03-26 ENCOUNTER — Encounter (HOSPITAL_COMMUNITY): Payer: Self-pay

## 2011-03-26 LAB — CBC
Platelets: 238
RDW: 12.8

## 2011-03-26 LAB — BASIC METABOLIC PANEL
BUN: 7
Calcium: 8.9
GFR calc non Af Amer: >60
Glucose, Bld: 198 — ABNORMAL HIGH
Sodium: 135

## 2011-03-26 LAB — PROTIME-INR
INR: 1
Prothrombin Time: 13.1

## 2011-03-26 LAB — APTT: aPTT: 36

## 2011-03-29 ENCOUNTER — Encounter (HOSPITAL_COMMUNITY): Payer: Self-pay | Attending: Cardiovascular Disease

## 2011-03-29 DIAGNOSIS — Z794 Long term (current) use of insulin: Secondary | ICD-10-CM | POA: Insufficient documentation

## 2011-03-29 DIAGNOSIS — Z5189 Encounter for other specified aftercare: Secondary | ICD-10-CM | POA: Insufficient documentation

## 2011-03-29 DIAGNOSIS — I10 Essential (primary) hypertension: Secondary | ICD-10-CM | POA: Insufficient documentation

## 2011-03-29 DIAGNOSIS — E785 Hyperlipidemia, unspecified: Secondary | ICD-10-CM | POA: Insufficient documentation

## 2011-03-29 DIAGNOSIS — E109 Type 1 diabetes mellitus without complications: Secondary | ICD-10-CM | POA: Insufficient documentation

## 2011-03-29 DIAGNOSIS — Z951 Presence of aortocoronary bypass graft: Secondary | ICD-10-CM | POA: Insufficient documentation

## 2011-03-29 DIAGNOSIS — G4733 Obstructive sleep apnea (adult) (pediatric): Secondary | ICD-10-CM | POA: Insufficient documentation

## 2011-03-29 DIAGNOSIS — Q8500 Neurofibromatosis, unspecified: Secondary | ICD-10-CM | POA: Insufficient documentation

## 2011-03-29 DIAGNOSIS — I319 Disease of pericardium, unspecified: Secondary | ICD-10-CM | POA: Insufficient documentation

## 2011-03-29 DIAGNOSIS — F341 Dysthymic disorder: Secondary | ICD-10-CM | POA: Insufficient documentation

## 2011-03-29 DIAGNOSIS — Z79899 Other long term (current) drug therapy: Secondary | ICD-10-CM | POA: Insufficient documentation

## 2011-03-29 DIAGNOSIS — I2510 Atherosclerotic heart disease of native coronary artery without angina pectoris: Secondary | ICD-10-CM | POA: Insufficient documentation

## 2011-03-29 DIAGNOSIS — I252 Old myocardial infarction: Secondary | ICD-10-CM | POA: Insufficient documentation

## 2011-03-29 DIAGNOSIS — Z9641 Presence of insulin pump (external) (internal): Secondary | ICD-10-CM | POA: Insufficient documentation

## 2011-03-29 DIAGNOSIS — I498 Other specified cardiac arrhythmias: Secondary | ICD-10-CM | POA: Insufficient documentation

## 2011-03-31 ENCOUNTER — Encounter (HOSPITAL_COMMUNITY): Payer: Self-pay

## 2011-04-02 ENCOUNTER — Encounter (HOSPITAL_COMMUNITY): Payer: Self-pay

## 2011-04-05 ENCOUNTER — Encounter (HOSPITAL_COMMUNITY): Payer: Self-pay

## 2011-04-07 ENCOUNTER — Encounter (HOSPITAL_COMMUNITY): Payer: Self-pay

## 2011-04-09 ENCOUNTER — Encounter (HOSPITAL_COMMUNITY): Payer: Self-pay

## 2011-04-12 ENCOUNTER — Encounter (HOSPITAL_COMMUNITY): Payer: Self-pay

## 2011-04-14 ENCOUNTER — Encounter (HOSPITAL_COMMUNITY): Payer: Self-pay

## 2011-04-16 ENCOUNTER — Encounter (HOSPITAL_COMMUNITY): Payer: Self-pay

## 2011-04-19 ENCOUNTER — Encounter (HOSPITAL_COMMUNITY): Payer: Self-pay

## 2011-04-21 ENCOUNTER — Encounter (HOSPITAL_COMMUNITY): Payer: Self-pay

## 2011-04-21 ENCOUNTER — Telehealth: Payer: Self-pay | Admitting: Cardiovascular Disease

## 2011-04-21 NOTE — Telephone Encounter (Signed)
Pt felt lethargic and tired after cardiac rehab and his PCP has changed his medication and he wants to discuss this

## 2011-04-21 NOTE — Telephone Encounter (Signed)
I spoke with the pt and his PCP recently stopped his Vytorin and switched him to Crestor and Zetia about 10 days ago.  The pt does have sleep apnea and uses oxygen at night.  The pt did not sleep well last night.  Today the pt felt lethargic after cardiac rehab and has felt more tired than usual.  BP was normal at rehab. I told the pt that this may be related to him not resting well last night.  I instructed the pt to call our office if he developed worsening or new symptoms. The pt agreed with plan.

## 2011-04-23 ENCOUNTER — Encounter (HOSPITAL_COMMUNITY): Payer: Self-pay

## 2011-04-26 ENCOUNTER — Encounter (HOSPITAL_COMMUNITY): Payer: Self-pay

## 2011-04-27 ENCOUNTER — Other Ambulatory Visit: Payer: Self-pay | Admitting: Cardiovascular Disease

## 2011-04-28 ENCOUNTER — Encounter (HOSPITAL_COMMUNITY): Payer: Self-pay

## 2011-04-30 ENCOUNTER — Encounter (HOSPITAL_COMMUNITY): Payer: Self-pay

## 2011-04-30 DIAGNOSIS — E109 Type 1 diabetes mellitus without complications: Secondary | ICD-10-CM | POA: Insufficient documentation

## 2011-04-30 DIAGNOSIS — I2510 Atherosclerotic heart disease of native coronary artery without angina pectoris: Secondary | ICD-10-CM | POA: Insufficient documentation

## 2011-04-30 DIAGNOSIS — Z794 Long term (current) use of insulin: Secondary | ICD-10-CM | POA: Insufficient documentation

## 2011-04-30 DIAGNOSIS — Z951 Presence of aortocoronary bypass graft: Secondary | ICD-10-CM | POA: Insufficient documentation

## 2011-04-30 DIAGNOSIS — I498 Other specified cardiac arrhythmias: Secondary | ICD-10-CM | POA: Insufficient documentation

## 2011-04-30 DIAGNOSIS — I10 Essential (primary) hypertension: Secondary | ICD-10-CM | POA: Insufficient documentation

## 2011-04-30 DIAGNOSIS — Z9641 Presence of insulin pump (external) (internal): Secondary | ICD-10-CM | POA: Insufficient documentation

## 2011-04-30 DIAGNOSIS — F341 Dysthymic disorder: Secondary | ICD-10-CM | POA: Insufficient documentation

## 2011-04-30 DIAGNOSIS — Z5189 Encounter for other specified aftercare: Secondary | ICD-10-CM | POA: Insufficient documentation

## 2011-04-30 DIAGNOSIS — Q8500 Neurofibromatosis, unspecified: Secondary | ICD-10-CM | POA: Insufficient documentation

## 2011-04-30 DIAGNOSIS — Z79899 Other long term (current) drug therapy: Secondary | ICD-10-CM | POA: Insufficient documentation

## 2011-04-30 DIAGNOSIS — I319 Disease of pericardium, unspecified: Secondary | ICD-10-CM | POA: Insufficient documentation

## 2011-04-30 DIAGNOSIS — E785 Hyperlipidemia, unspecified: Secondary | ICD-10-CM | POA: Insufficient documentation

## 2011-04-30 DIAGNOSIS — G4733 Obstructive sleep apnea (adult) (pediatric): Secondary | ICD-10-CM | POA: Insufficient documentation

## 2011-04-30 DIAGNOSIS — I252 Old myocardial infarction: Secondary | ICD-10-CM | POA: Insufficient documentation

## 2011-05-03 ENCOUNTER — Encounter (HOSPITAL_COMMUNITY): Payer: Self-pay

## 2011-05-04 ENCOUNTER — Encounter (HOSPITAL_COMMUNITY): Payer: Self-pay

## 2011-05-05 ENCOUNTER — Encounter (HOSPITAL_COMMUNITY): Payer: Self-pay

## 2011-05-07 ENCOUNTER — Encounter (HOSPITAL_COMMUNITY): Payer: Self-pay

## 2011-05-10 ENCOUNTER — Encounter (HOSPITAL_COMMUNITY): Payer: Self-pay

## 2011-05-12 ENCOUNTER — Encounter (HOSPITAL_COMMUNITY): Payer: Self-pay

## 2011-05-14 ENCOUNTER — Encounter (HOSPITAL_COMMUNITY)
Admission: RE | Admit: 2011-05-14 | Discharge: 2011-05-14 | Disposition: A | Discharge status: Home / Self Care | Payer: Self-pay | Source: Ambulatory Visit | Attending: Cardiovascular Disease | Admitting: Cardiovascular Disease

## 2011-05-17 ENCOUNTER — Encounter (HOSPITAL_COMMUNITY)
Admission: RE | Admit: 2011-05-17 | Discharge: 2011-05-17 | Disposition: A | Discharge status: Home / Self Care | Payer: Self-pay | Source: Ambulatory Visit | Attending: Cardiovascular Disease | Admitting: Cardiovascular Disease

## 2011-05-19 ENCOUNTER — Encounter (HOSPITAL_COMMUNITY)
Admission: RE | Admit: 2011-05-19 | Discharge: 2011-05-19 | Disposition: A | Discharge status: Home / Self Care | Payer: Self-pay | Source: Ambulatory Visit | Attending: Cardiovascular Disease | Admitting: Cardiovascular Disease

## 2011-05-21 ENCOUNTER — Encounter (HOSPITAL_COMMUNITY): Payer: Self-pay

## 2011-05-24 ENCOUNTER — Telehealth: Payer: Self-pay | Admitting: Cardiovascular Disease

## 2011-05-24 ENCOUNTER — Ambulatory Visit: Payer: BC Managed Care – PPO | Admitting: Cardiovascular Disease

## 2011-05-24 ENCOUNTER — Encounter (HOSPITAL_COMMUNITY): Payer: Self-pay

## 2011-05-24 NOTE — Telephone Encounter (Signed)
I spoke with the pt and he had an appt today with Dr Excell Seltzer but this was cancelled because the MD is sick. The pt has been rescheduled with Scott PA-C on 06/03/11.  The pt would like to see Dr Excell Seltzer when available.  I made the pt aware that Dr Excell Seltzer is going to make-up his clinic from today and when the schedule has been changed I will contact him with a new appointment.  The pt also is sick at this time with a respiratory illness.  The pt is taking claritin, mucinex and benzonatate.  The pt does have chest discomfort when he coughs.  I made the pt aware that this is muscular pain from coughing.

## 2011-05-24 NOTE — Telephone Encounter (Signed)
New Problem:   Would like to know if he would be able to be scheduled to see Dr. Excell Seltzer sooner than the 26th of December.

## 2011-05-26 ENCOUNTER — Encounter (HOSPITAL_COMMUNITY)
Admission: RE | Admit: 2011-05-26 | Discharge: 2011-05-26 | Disposition: A | Discharge status: Home / Self Care | Payer: Self-pay | Source: Ambulatory Visit | Attending: Cardiovascular Disease | Admitting: Cardiovascular Disease

## 2011-05-28 ENCOUNTER — Encounter (HOSPITAL_COMMUNITY)
Admission: RE | Admit: 2011-05-28 | Discharge: 2011-05-28 | Disposition: A | Discharge status: Home / Self Care | Payer: Self-pay | Source: Ambulatory Visit | Attending: Cardiovascular Disease | Admitting: Cardiovascular Disease

## 2011-05-28 NOTE — Telephone Encounter (Signed)
Schedule change made for Dr Excell Seltzer and Jeanie Cooks will contact the patient with a new appointment.

## 2011-05-31 ENCOUNTER — Encounter (HOSPITAL_COMMUNITY)
Admission: RE | Admit: 2011-05-31 | Discharge: 2011-05-31 | Disposition: A | Discharge status: Home / Self Care | Payer: Self-pay | Source: Ambulatory Visit | Attending: Cardiovascular Disease | Admitting: Cardiovascular Disease

## 2011-05-31 DIAGNOSIS — I498 Other specified cardiac arrhythmias: Secondary | ICD-10-CM | POA: Insufficient documentation

## 2011-05-31 DIAGNOSIS — E109 Type 1 diabetes mellitus without complications: Secondary | ICD-10-CM | POA: Insufficient documentation

## 2011-05-31 DIAGNOSIS — Z951 Presence of aortocoronary bypass graft: Secondary | ICD-10-CM | POA: Insufficient documentation

## 2011-05-31 DIAGNOSIS — E785 Hyperlipidemia, unspecified: Secondary | ICD-10-CM | POA: Insufficient documentation

## 2011-05-31 DIAGNOSIS — I10 Essential (primary) hypertension: Secondary | ICD-10-CM | POA: Insufficient documentation

## 2011-05-31 DIAGNOSIS — G4733 Obstructive sleep apnea (adult) (pediatric): Secondary | ICD-10-CM | POA: Insufficient documentation

## 2011-05-31 DIAGNOSIS — I252 Old myocardial infarction: Secondary | ICD-10-CM | POA: Insufficient documentation

## 2011-05-31 DIAGNOSIS — Q8500 Neurofibromatosis, unspecified: Secondary | ICD-10-CM | POA: Insufficient documentation

## 2011-05-31 DIAGNOSIS — Z794 Long term (current) use of insulin: Secondary | ICD-10-CM | POA: Insufficient documentation

## 2011-05-31 DIAGNOSIS — Z9641 Presence of insulin pump (external) (internal): Secondary | ICD-10-CM | POA: Insufficient documentation

## 2011-05-31 DIAGNOSIS — I319 Disease of pericardium, unspecified: Secondary | ICD-10-CM | POA: Insufficient documentation

## 2011-05-31 DIAGNOSIS — Z5189 Encounter for other specified aftercare: Secondary | ICD-10-CM | POA: Insufficient documentation

## 2011-05-31 DIAGNOSIS — F341 Dysthymic disorder: Secondary | ICD-10-CM | POA: Insufficient documentation

## 2011-05-31 DIAGNOSIS — Z79899 Other long term (current) drug therapy: Secondary | ICD-10-CM | POA: Insufficient documentation

## 2011-05-31 DIAGNOSIS — I2510 Atherosclerotic heart disease of native coronary artery without angina pectoris: Secondary | ICD-10-CM | POA: Insufficient documentation

## 2011-06-01 ENCOUNTER — Encounter: Payer: Self-pay | Admitting: Cardiovascular Disease

## 2011-06-01 ENCOUNTER — Ambulatory Visit (INDEPENDENT_AMBULATORY_CARE_PROVIDER_SITE_OTHER): Payer: BC Managed Care – PPO | Admitting: Cardiovascular Disease

## 2011-06-01 VITALS — BP 104/66 | HR 85 | Ht 71.0 in | Wt 234.8 lb

## 2011-06-01 DIAGNOSIS — I25810 Atherosclerosis of coronary artery bypass graft(s) without angina pectoris: Secondary | ICD-10-CM

## 2011-06-01 MED ORDER — NITROGLYCERIN 0.4 MG SL SUBL: 0.4000 mg | SUBLINGUAL_TABLET | SUBLINGUAL | Status: DC | PRN

## 2011-06-01 NOTE — Assessment & Plan Note (Signed)
The patient's lipids are followed by Dr. Waynard Edwards. He is on a combination of Crestor and ezetemide.

## 2011-06-01 NOTE — Patient Instructions (Signed)
Your physician has requested that you have en exercise stress myoview. For further information please visit [email or web address]. Please follow instruction sheet, as given.  Your physician wants you to follow-up in: 6 months. You will receive a reminder letter in the mail two months in advance. If you don't receive a letter, please call our office to schedule the follow-up appointment.  

## 2011-06-01 NOTE — Progress Notes (Signed)
HPI:  [name] presents for followup evaluation. He is a 45 year old gentleman with extensive coronary artery disease. He underwent coronary bypass surgery in 2011 and unfortunately had early graft failure. He required multivessel stenting utilizing drug-eluting stent platforms after his surgery.  He continues to complain of marked generalized fatigue. This has been a long-standing complaint that he feels is worsening. He said an episode of chest discomfort unrelated to exertion in the midsternal region. He describes this as an aching feeling. He continues to participate in regular cardiac rehabilitation sessions and has had no exertional chest discomfort.  He denies dyspnea except when bending over. He denies edema, orthopnea, PND, or palpitations.  Outpatient Encounter Prescriptions as of 06/01/2011  Medication Sig Dispense Refill  . Azithromycin (ZITHROMAX Z-PAK PO) Take by mouth.        . benzonatate (TESSALON) 100 MG capsule Take 100 mg by mouth 3 (three) times daily as needed.        . carbamazepine (TEGRETOL) 200 MG tablet 3 tablets two times a day       . docusate sodium (COLACE) 100 MG capsule Take 100 mg by mouth daily.        [name] ezetimibe (ZETIA) 10 MG tablet Take 5 mg by mouth daily.        [name] guaiFENesin (MUCINEX) 600 MG 12 hr tablet Take 1,200 mg by mouth 2 (two) times daily.        . insulin aspart (NOVOLOG) 100 UNIT/ML injection Inject into the skin. Insulin pump. Use as directed       . levETIRAcetam (KEPPRA) 500 MG tablet Take 1/2 tablet in the morning and 1 tablet in the evening       . lisinopril (PRINIVIL,ZESTRIL) 10 MG tablet Take 5 mg by mouth daily.        . metoprolol tartrate (LOPRESSOR) 25 MG tablet take 1 tablet by mouth twice a day  60 tablet  5  . multivitamin (ONE-A-DAY MEN'S) TABS Take 1 tablet by mouth daily.        . nitroGLYCERIN (NITROSTAT) 0.4 MG SL tablet Place 0.4 mg under the tongue every 5 (five) minutes as needed.        . NON FORMULARY Oxygen at 2 liters/min  at night       . prasugrel (EFFIENT) 10 MG TABS Take 1 tablet (10 mg total) by mouth daily.  30 tablet  6  . rosuvastatin (CRESTOR) 20 MG tablet Take 20 mg by mouth daily.        . sertraline (ZOLOFT) 100 MG tablet Take 100 mg by mouth daily.       [name] acetaminophen (TYLENOL) 325 MG tablet Take 650 mg by mouth every 6 (six) hours as needed.        [name] aspirin 81 MG tablet Take 81 mg by mouth daily.        [name] DISCONTD: ezetimibe-simvastatin (VYTORIN) 10-40 MG per tablet Take 1 tablet by mouth at bedtime.      [name] DISCONTD: lisinopril (PRINIVIL,ZESTRIL) 20 MG tablet take 1 tablet by mouth once daily  30 tablet  6    Allergies  Allergen Reactions  . Latex     Past Medical History  Diagnosis Date  . CAD (coronary artery disease)     s/p CABG, NSTEMI  . Diabetes mellitus type I     on insulin pump at  home  . Sleep apnea     s/p oral surgery  . Hyperlipidemia   . 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 Diabetic retinopathy     s/p vitrectomy and history of retinal surgery  . HTN (hypertension)   . Seizure disorder   . Seasonal allergic rhinitis   . Depression     ROS: Negative except as per HPI  BP 104/66  Pulse 85  Ht 5\' 11"  (1.803 m)  Wt 106.505 kg (234 lb 12.8 oz)  BMI 32.75 kg/m2  PHYSICAL EXAM: Pt is alert and oriented, NAD HEENT: normal Neck: JVP - normal, carotids 2+= without bruits Lungs: CTA bilaterally CV: RRR without murmur or gallop Abd: soft, NT, Positive BS, no hepatomegaly Ext: no C/C/E, distal pulses intact and equal Skin: warm/dry no rash, Distal hair loss on his legs.  ASSESSMENT AND PLAN:

## 2011-06-01 NOTE — Assessment & Plan Note (Signed)
The patient has extensive CAD as outlined above. He does not have clear exertional angina, but in the setting of long-standing diabetes he is at high risk of silent ischemia. He is now 15 months out from his last PCI procedure and I have recommended an exercise Myoview stress scan. This will help with further risk stratification and will serve as objective evidence of his ischemic burden. He otherwise will continue his current medical program. If his stress test is low risk, I will likely change him from effient to Plavix to reduce long-term bleeding risk.  I am inclined to keep him on long-term dual antiplatelet therapy considering his extensive native vessel and bypass graft disease.

## 2011-06-02 ENCOUNTER — Encounter (HOSPITAL_COMMUNITY): Payer: Self-pay | Admitting: *Deleted

## 2011-06-02 ENCOUNTER — Observation Stay (HOSPITAL_COMMUNITY)
Admission: EM | Admit: 2011-06-02 | Discharge: 2011-06-03 | Disposition: A | Discharge status: Home / Self Care | Payer: BC Managed Care – PPO | Attending: Cardiology | Admitting: Cardiology

## 2011-06-02 ENCOUNTER — Emergency Department (HOSPITAL_COMMUNITY): Payer: BC Managed Care – PPO

## 2011-06-02 ENCOUNTER — Telehealth: Payer: Self-pay

## 2011-06-02 ENCOUNTER — Encounter (HOSPITAL_COMMUNITY)
Admission: RE | Admit: 2011-06-02 | Discharge: 2011-06-02 | Disposition: A | Discharge status: Home / Self Care | Payer: Self-pay | Source: Ambulatory Visit | Attending: Cardiovascular Disease | Admitting: Cardiovascular Disease

## 2011-06-02 ENCOUNTER — Other Ambulatory Visit: Payer: Self-pay

## 2011-06-02 DIAGNOSIS — R079 Chest pain, unspecified: Secondary | ICD-10-CM

## 2011-06-02 DIAGNOSIS — Q8500 Neurofibromatosis, unspecified: Secondary | ICD-10-CM | POA: Insufficient documentation

## 2011-06-02 DIAGNOSIS — Z951 Presence of aortocoronary bypass graft: Secondary | ICD-10-CM | POA: Insufficient documentation

## 2011-06-02 DIAGNOSIS — E785 Hyperlipidemia, unspecified: Secondary | ICD-10-CM | POA: Insufficient documentation

## 2011-06-02 DIAGNOSIS — R0789 Other chest pain: Principal | ICD-10-CM | POA: Insufficient documentation

## 2011-06-02 DIAGNOSIS — Z9641 Presence of insulin pump (external) (internal): Secondary | ICD-10-CM | POA: Insufficient documentation

## 2011-06-02 DIAGNOSIS — Z794 Long term (current) use of insulin: Secondary | ICD-10-CM | POA: Insufficient documentation

## 2011-06-02 DIAGNOSIS — E109 Type 1 diabetes mellitus without complications: Secondary | ICD-10-CM | POA: Insufficient documentation

## 2011-06-02 DIAGNOSIS — G4730 Sleep apnea, unspecified: Secondary | ICD-10-CM | POA: Insufficient documentation

## 2011-06-02 DIAGNOSIS — I10 Essential (primary) hypertension: Secondary | ICD-10-CM | POA: Insufficient documentation

## 2011-06-02 DIAGNOSIS — I2510 Atherosclerotic heart disease of native coronary artery without angina pectoris: Secondary | ICD-10-CM | POA: Insufficient documentation

## 2011-06-02 DIAGNOSIS — R0602 Shortness of breath: Secondary | ICD-10-CM | POA: Insufficient documentation

## 2011-06-02 LAB — CARDIAC PANEL(CRET KIN+CKTOT+MB+TROPI)
CK, MB: 1.9 ng/mL (ref 0.3–4.0)
Relative Index: INVALID (ref 0.0–2.5)
Total CK: 43 U/L (ref 7–232)
Troponin I: <0.3 ng/mL (ref <0.30)

## 2011-06-02 LAB — BASIC METABOLIC PANEL
Calcium: 9.1 mg/dL (ref 8.4–10.5)
Creatinine, Ser: 0.71 mg/dL (ref 0.50–1.35)
GFR calc Af Amer: >90 mL/min (ref >90)

## 2011-06-02 LAB — TSH: TSH: 0.319 u[IU]/mL — ABNORMAL LOW (ref 0.350–4.500)

## 2011-06-02 LAB — CBC
Platelets: 200 10*3/uL (ref 150–400)
RDW: 12.6 % (ref 11.5–15.5)
WBC: 5 10*3/uL (ref 4.0–10.5)

## 2011-06-02 LAB — HEMOGLOBIN A1C
Hgb A1c MFr Bld: 6.4 % — ABNORMAL HIGH (ref <5.7)
Mean Plasma Glucose: 137 mg/dL — ABNORMAL HIGH (ref <117)

## 2011-06-02 LAB — APTT: aPTT: 33 seconds (ref 24–37)

## 2011-06-02 LAB — GLUCOSE, CAPILLARY
Glucose-Capillary: 103 mg/dL — ABNORMAL HIGH (ref 70–99)
Glucose-Capillary: 52 mg/dL — ABNORMAL LOW (ref 70–99)

## 2011-06-02 LAB — PROTIME-INR: Prothrombin Time: 13.6 seconds (ref 11.6–15.2)

## 2011-06-02 MED ORDER — ENOXAPARIN SODIUM 100 MG/ML ~~LOC~~ SOLN
100.0000 mg | SUBCUTANEOUS | Status: DC
  Filled 2011-06-02: qty 1

## 2011-06-02 MED ORDER — ASPIRIN EC 81 MG PO TBEC
81.0000 mg | DELAYED_RELEASE_TABLET | Freq: Every day | ORAL | Status: DC
Start: 2011-06-03 — End: 2011-06-03
  Filled 2011-06-02: qty 1

## 2011-06-02 MED ORDER — NON FORMULARY: 2.0000 L/min | Freq: Every day | Status: DC

## 2011-06-02 MED ORDER — THERA M PLUS PO TABS
1.0000 | ORAL_TABLET | Freq: Every day | ORAL | Status: DC
  Administered 2011-06-03: 1 via ORAL
  Filled 2011-06-02 (×2): qty 1

## 2011-06-02 MED ORDER — PRASUGREL HCL 10 MG PO TABS
10.0000 mg | ORAL_TABLET | Freq: Every day | ORAL | Status: DC
  Administered 2011-06-03: 10 mg via ORAL
  Filled 2011-06-02: qty 1

## 2011-06-02 MED ORDER — ONE-A-DAY MENS PO TABS
1.0000 | ORAL_TABLET | Freq: Every day | ORAL | Status: DC
  Administered 2011-06-02: 1 via ORAL

## 2011-06-02 MED ORDER — LISINOPRIL 5 MG PO TABS
5.0000 mg | ORAL_TABLET | Freq: Every day | ORAL | Status: DC
  Administered 2011-06-03: 5 mg via ORAL
  Filled 2011-06-02: qty 1

## 2011-06-02 MED ORDER — EZETIMIBE 10 MG PO TABS
5.0000 mg | ORAL_TABLET | Freq: Every day | ORAL | Status: DC
  Filled 2011-06-02 (×2): qty 0.5

## 2011-06-02 MED ORDER — ACETAMINOPHEN 500 MG PO TABS
1000.0000 mg | ORAL_TABLET | Freq: Four times a day (QID) | ORAL | Status: DC | PRN
  Filled 2011-06-02: qty 2

## 2011-06-02 MED ORDER — LEVETIRACETAM 500 MG PO TABS
500.0000 mg | ORAL_TABLET | Freq: Two times a day (BID) | ORAL | Status: DC
  Administered 2011-06-02: 500 mg via ORAL
  Filled 2011-06-02 (×3): qty 1

## 2011-06-02 MED ORDER — GUAIFENESIN ER 600 MG PO TB12
600.0000 mg | ORAL_TABLET | Freq: Two times a day (BID) | ORAL | Status: DC
  Administered 2011-06-02 – 2011-06-03 (×2): 600 mg via ORAL
  Filled 2011-06-02 (×3): qty 1

## 2011-06-02 MED ORDER — BENZONATATE 100 MG PO CAPS
100.0000 mg | ORAL_CAPSULE | Freq: Three times a day (TID) | ORAL | Status: DC | PRN
  Filled 2011-06-02: qty 1

## 2011-06-02 MED ORDER — ACETAMINOPHEN 325 MG PO TABS: 650.0000 mg | ORAL_TABLET | ORAL | Status: DC | PRN

## 2011-06-02 MED ORDER — NITROGLYCERIN 0.4 MG SL SUBL
0.4000 mg | SUBLINGUAL_TABLET | SUBLINGUAL | Status: DC | PRN
  Administered 2011-06-02: 0.4 mg via SUBLINGUAL
  Filled 2011-06-02: qty 25

## 2011-06-02 MED ORDER — AZITHROMYCIN 500 MG PO TABS
500.0000 mg | ORAL_TABLET | Freq: Every day | ORAL | Status: DC
  Administered 2011-06-02: 500 mg via ORAL
  Filled 2011-06-02 (×2): qty 1

## 2011-06-02 MED ORDER — METOPROLOL TARTRATE 25 MG PO TABS
25.0000 mg | ORAL_TABLET | Freq: Two times a day (BID) | ORAL | Status: DC
  Administered 2011-06-02 – 2011-06-03 (×2): 25 mg via ORAL
  Filled 2011-06-02 (×3): qty 1

## 2011-06-02 MED ORDER — CARBAMAZEPINE ER 400 MG PO TB12
600.0000 mg | ORAL_TABLET | Freq: Two times a day (BID) | ORAL | Status: DC
  Administered 2011-06-02 – 2011-06-03 (×2): 600 mg via ORAL
  Filled 2011-06-02 (×3): qty 1

## 2011-06-02 MED ORDER — ROSUVASTATIN CALCIUM 20 MG PO TABS
20.0000 mg | ORAL_TABLET | Freq: Every day | ORAL | Status: DC
  Filled 2011-06-02 (×3): qty 1

## 2011-06-02 MED ORDER — INSULIN PUMP
SUBCUTANEOUS | Status: DC
  Filled 2011-06-02: qty 1

## 2011-06-02 MED ORDER — ONDANSETRON HCL 4 MG/2ML IJ SOLN: 4.0000 mg | Freq: Four times a day (QID) | INTRAMUSCULAR | Status: DC | PRN

## 2011-06-02 MED ORDER — ENOXAPARIN SODIUM 100 MG/ML ~~LOC~~ SOLN: 100.0000 mg | Freq: Two times a day (BID) | SUBCUTANEOUS | Status: DC

## 2011-06-02 MED ORDER — ENOXAPARIN SODIUM 100 MG/ML ~~LOC~~ SOLN
100.0000 mg | Freq: Two times a day (BID) | SUBCUTANEOUS | Status: DC
  Administered 2011-06-02 – 2011-06-03 (×2): 100 mg via SUBCUTANEOUS
  Filled 2011-06-02 (×4): qty 1

## 2011-06-02 MED ORDER — DOCUSATE SODIUM 100 MG PO CAPS
100.0000 mg | ORAL_CAPSULE | Freq: Every day | ORAL | Status: DC
Start: 2011-06-03 — End: 2011-06-03
  Administered 2011-06-03: 100 mg via ORAL
  Filled 2011-06-02: qty 1

## 2011-06-02 MED ORDER — SERTRALINE HCL 100 MG PO TABS
100.0000 mg | ORAL_TABLET | Freq: Every day | ORAL | Status: DC
  Administered 2011-06-02: 100 mg via ORAL
  Filled 2011-06-02 (×2): qty 1

## 2011-06-02 NOTE — ED Notes (Signed)
Patient talking with family and caregivers with NAD at this time.

## 2011-06-02 NOTE — ED Notes (Signed)
Report given to [facility], patient transferred to [place].

## 2011-06-02 NOTE — Progress Notes (Signed)
ANTICOAGULATION CONSULT NOTE - Initial Consult  Pharmacy Consult for Lovenox Indication: chest pain/ACS  Allergies  Allergen Reactions  . Latex Rash    Patient Measurements: (06/01/11) Ht = 180.3 cm (06/01/11) Wt = 106.5 kg  Vital Signs: Temp: 97.9 F (36.6 C) (12/05 1302) Temp src: Oral (12/05 1302) BP: 108/64 mmHg (12/05 1600) Pulse Rate: 75  (12/05 1600)  Labs:  Basename 06/02/11 1352  HGB 14.3  HCT 41.4  PLT 200  APTT --  LABPROT --  INR --  HEPARINUNFRC --  CREATININE 0.71  CKTOTAL --  CKMB --  TROPONINI <0.30   The CrCl is unknown because both a height and weight (above a minimum accepted value) are required for this calculation.  Medical History: Past Medical History  Diagnosis Date  . CAD (coronary artery disease)     s/p CABG, NSTEMI  . Diabetes mellitus type I     on insulin pump at  home  . Sleep apnea     s/p oral surgery  . Hyperlipidemia   . 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 Diabetic retinopathy     s/p vitrectomy and history of retinal surgery  . HTN (hypertension)   . Seizure disorder   . Seasonal allergic rhinitis   . Depression     Medications:   (Not in a hospital admission) Pertinent pre-hospital medications include aspirin 81mg  po daily and Effient 10mg  po daily.   Assessment: 45 year old male with known CAD who had chest pain during his office visit with Dr. Excell Seltzer today, now in ED to start Lovenox for ACS/STEMI. SCr 0.71/estCrCl >100 based on weight/height/SCr.   Goal of Therapy:  Anti-Xa Level of 0.6 to 1.2 if needed.    Plan:  Lovenox 100mg  SQ q12h. CBC every 72 hours while on therapy.  Will monitor renal function and make adjustments as needed.    Fayne Norrie 06/02/2011,5:11 PM

## 2011-06-02 NOTE — ED Notes (Signed)
Patient stated he felt sick and nauseated, repeat EKG given to doctor and nitro sublingual x 1 given.

## 2011-06-02 NOTE — ED Notes (Signed)
MD at bedside. 

## 2011-06-02 NOTE — ED Provider Notes (Signed)
History     CSN: [id number] Arrival date & time: 06/02/2011  1:07 PM   First MD Initiated Contact with Patient 06/02/11 1319      Chief Complaint  Patient presents with  . Chest Pain     HPI Patient presents to emergency room with complaints of chest pain. Patient was at cardiac rehabilitation when he developed chest pain on the treadmill. Patient has associated shortness of breath. Patient reports the chest pain as a 2/10. Patient denies any other chest pain the last few weeks. Patient was sent from the rehabilitation center to the ER for further evaluation. Patient has a history of CABG and non-STEMI. No recent travel or surgeries, no other complaints. Patient of Dr. Randolm Idol.   Past Medical History  Diagnosis Date  . CAD (coronary artery disease)     s/p CABG, NSTEMI  . Diabetes mellitus type I     on insulin pump at  home  . Sleep apnea     s/p oral surgery  . Hyperlipidemia   . 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 Diabetic retinopathy     s/p vitrectomy and history of retinal surgery  . HTN (hypertension)   . Seizure disorder   . Seasonal allergic rhinitis   . Depression     Past Surgical History  Procedure Date  . Coronary artery bypass graft   . Uvulopalatoplasty surgery   . Tonsillectomy   . Release of right transverse carpal ligament   . Right eye vitrectomy and detached retina repair 10/2007    Family History  Problem Relation Age of Onset  . Coronary artery disease Father   . Diabetes Father     type II  . Hypertension Father   . Squamous cell carcinoma Brother   . Arthritis Sister     History  Substance Use Topics  . Smoking status: Never Smoker   . Smokeless tobacco: Not on file  . Alcohol Use: No      Review of Systems  Constitutional: Negative for fever, chills, diaphoresis and appetite change.  HENT: Negative for neck pain.   Eyes: Negative for photophobia and visual disturbance.  Respiratory:  Positive for chest tightness and shortness of breath. Negative for cough.   Cardiovascular: Positive for chest pain. Negative for palpitations and leg swelling.  Gastrointestinal: Negative for nausea, vomiting and abdominal pain.  Genitourinary: Negative for flank pain.  Musculoskeletal: Negative for back pain.  Skin: Negative for rash.  Neurological: Negative for weakness and numbness.  All other systems reviewed and are negative.    Allergies  Latex  Home Medications   Current Outpatient Rx  Name Route Sig Dispense Refill  . ACETAMINOPHEN 500 MG PO TABS Oral Take 1,000 mg by mouth every 6 (six) hours as needed. As needed for pain.     . ASPIRIN 81 MG PO TABS Oral Take 81 mg by mouth daily.      . AZITHROMYCIN 500 MG PO TABS Oral Take 500 mg by mouth daily. Started z-pack Monday night 05/31/11     . BENZONATATE 100 MG PO CAPS Oral Take 100 mg by mouth 3 (three) times daily as needed. As needed for cough.    . CARBAMAZEPINE ER 200 MG PO TB12 Oral Take 600 mg by mouth 2 (two) times daily.     [name] DOCUSATE SODIUM 100 MG PO CAPS Oral Take 100 mg by mouth daily.      [name] EZETIMIBE 10  MG PO TABS Oral Take 5 mg by mouth daily.      . GUAIFENESIN ER 600 MG PO TB12 Oral Take 600-1,200 mg by mouth 2 (two) times daily. As needed for congestion.    . INSULIN ASPART 100 UNIT/ML [place] SOLN Subcutaneous Inject into the skin. Insulin pump. Use as directed    . LEVETIRACETAM 500 MG PO TABS  Take 1/2 tablet in the morning and 1 tablet in the evening     . LISINOPRIL 10 MG PO TABS Oral Take 5 mg by mouth daily.      [name] METOPROLOL TARTRATE 25 MG PO TABS  take 1 tablet by mouth twice a day 60 tablet 5  . ONE-A-DAY MENS PO TABS Oral Take 1 tablet by mouth daily.      . NON FORMULARY  Oxygen at 2 liters/min at night     . PRASUGREL HCL 10 MG PO TABS Oral Take 1 tablet (10 mg total) by mouth daily. 30 tablet 6  . ROSUVASTATIN CALCIUM 20 MG PO TABS Oral Take 20 mg by mouth daily.      . SERTRALINE HCL 100 MG PO  TABS Oral Take 100 mg by mouth at bedtime.     [name] NITROGLYCERIN 0.4 MG SL SUBL Sublingual Place 1 tablet (0.4 mg total) under the tongue every 5 (five) minutes as needed. 25 tablet 3    BP 124/70  Pulse 74  Temp(Src) 97.9 F (36.6 C) (Oral)  Resp 15  SpO2 99%  Physical Exam  Constitutional: He is oriented to person, place, and time. He appears well-developed and well-nourished.  HENT:  Head: Normocephalic and atraumatic.  Eyes: EOM are normal. Pupils are equal, round, and reactive to light.  Neck: Normal range of motion. Neck supple.  Cardiovascular: Normal rate, regular rhythm, normal heart sounds and intact distal pulses.  Exam reveals no gallop and no friction rub.   No murmur heard. Pulmonary/Chest: Effort normal and breath sounds normal. No respiratory distress. He has no wheezes.  Abdominal: Soft. Bowel sounds are normal. He exhibits no distension. There is no tenderness. There is no rebound and no guarding.  Musculoskeletal: Normal range of motion. He exhibits no edema.  Neurological: He is alert and oriented to person, place, and time.  Skin: Skin is warm and dry.  Psychiatric: He has a normal mood and affect. His behavior is normal. Judgment and thought content normal.    ED Course  Procedures (including critical care time)  Patient seen and evaluated.  VSS reviewed. . Nursing notes reviewed. Discussed with attending physician. Initial testing ordered. Will monitor the patient closely. They agree with the treatment plan and diagnosis.   Results for orders placed during the hospital encounter of 06/02/11  BASIC METABOLIC PANEL      Component Value Range   Sodium 138  135 - 145 (mEq/L)   Potassium 4.6  3.5 - 5.1 (mEq/L)   Chloride 103  96 - 112 (mEq/L)   CO2 28  19 - 32 (mEq/L)   Glucose, Bld 146 (*) 70 - 99 (mg/dL)   BUN 10  6 - 23 (mg/dL)   Creatinine, Ser 1.61  0.50 - 1.35 (mg/dL)   Calcium 9.1  8.4 - 09.6 (mg/dL)   GFR calc non Af Amer >90  >90 (mL/min)   GFR  calc Af Amer >90  >90 (mL/min)  CBC      Component Value Range   WBC 5.0  4.0 - 10.5 (K/uL)   RBC 4.67  4.22 - 5.81 (MIL/uL)   Hemoglobin 14.3  13.0 - 17.0 (g/dL)   HCT 96.0  45.4 - 09.8 (%)   MCV 88.7  78.0 - 100.0 (fL)   MCH 30.6  26.0 - 34.0 (pg)   MCHC 34.5  30.0 - 36.0 (g/dL)   RDW 11.9  14.7 - 82.9 (%)   Platelets 200  150 - 400 (K/uL)  TROPONIN I      Component Value Range   Troponin I <0.30  <0.30 (ng/mL)   Dg Chest Port 1 View  06/02/2011  *RADIOLOGY REPORT*  Clinical Data: Chest pain  PORTABLE CHEST - 1 VIEW  Comparison: Portable exam 1352 hours compared to 07/29/2010  Findings: Upper normal heart size post CABG. Mediastinal contours and pulmonary vascularity normal. Lungs clear. No pleural effusion or pneumothorax Mildly lordotic positioning. Bones unremarkable.  IMPRESSION: Post CABG. No acute abnormalities.  Original Report Authenticated By: Lollie Marrow, M.D.     Date: 06/02/2011  Rate: 81   Rhythm: normal sinus rhythm  QRS Axis: normal  Intervals: normal  ST/T Wave abnormalities: nonspecific ST/T changes and early repolarization  Conduction Disutrbances:none  Narrative Interpretation:   Old EKG Reviewed: unchanged, unchanged from the one done at 15:23 as well.   Patient seen and re-evaluated. Resting comfortably. VSS stable. NAD. Patient notified of testing results. Stated agreement and understanding. Patient stated understanding to treatment plan and diagnosis.   3:13 PM spoke to  [facility] cardiology who will come see the patient for further evaluation.   MDM  Chest pain SOB        Demetrius Charity, [place] 06/02/11 1530

## 2011-06-02 NOTE — ED Provider Notes (Signed)
Medical screening examination/treatment/procedure(s) were performed by non-physician practitioner and as supervising physician I was immediately available for consultation/collaboration. Devoria Albe, MD, Armando Gang   Ward Givens, MD 06/02/11 [phone]

## 2011-06-02 NOTE — Telephone Encounter (Signed)
Jeff [name] called because the pt participates in the Maintenance program at cardiac rehab and today he developed 2/10 CP with exertion.  BP was normal, pt placed on monitor and in NSR.  Jeff [name] put the pt on oxygen and his CP has resolved.  She instructed the pt to not participate in rehab again until after his myoview. This test is not scheduled until 07/06/11.  Our office will contact the pt to arrange an earlier appointment for myoview.

## 2011-06-02 NOTE — ED Notes (Signed)
Did cbg on patient notified RN Dennie Bible of  Blood sugar

## 2011-06-02 NOTE — H&P (Signed)
Patient ID: Jeff [name] MRN: [id number], DOB/AGE: 47-Feb-1967 47 y.o. Date of Encounter: 06/02/2011  Primary Physician: Ezequiel Kayser, MD, MD Primary Cardiologist: Dr Tonny Bollman  Chief Complaint: Chest pain  HPI: Jeff [name] is a 45 year old male with a history of coronary artery disease. He has had prior coronary bypass surgery. His grafts  subsequently failed. He underwent multivessel PCI in 2011. Since then he has had no recurrent chest pain. He has complained of symptoms of fatigue. Please note Dr. Earmon Phoenix evaluation from the office yesterday. He was scheduled for a stress Myoview in early January. Patient has been an active participant in the maintenance cardiac rehabilitation over the past year. Today they were doing his year end assessment. His normal exercise routine was changed. He began by doing 6 minutes on the exercise bike. He then did some stretching and moved to the treadmill. He did his normal treadmill routine at 2 miles per hour at a 9% grade. Towards the end of this he developed a left precordial chest pain that was dull in nature. He had no shortness of breath. He graded this as 2/10. He sat down and was placed on oxygen therapy. His symptoms decreased to 1/10 but persisted. He was eventually brought to the emergency room where he was given sublingual nitroglycerin and notes that his discomfort resolved. He really has had no significant chest pain prior to this. Compared to his symptoms prior to this PCI last year they're similar but much milder in degree. He had no shortness of breath. He has had a recent upper respiratory illness with cough productive of clear phlegm. He has been taking cough medicine and is now on day 3 of a Z-Pak. He has had no fever or chills.  Past Medical History  Diagnosis Date  . CAD (coronary artery disease) 4-11    s/p CABG, NSTEMI    Percutaneous transluminal coronary angioplasty and stenting of the mid and distal left anterior descending,  percutaneous transluminal coronary angioplasty of the saphenous vein graft to first diagonal 9-11   NSTEMI  felt to be due to subendocardial ischemia as a heart catheterization done this admission was fortunately stable.  10-11      . Diabetes mellitus type I     on insulin pump at  home  . Sleep apnea     s/p oral surgery  . Hyperlipidemia   . 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 Diabetic retinopathy     s/p vitrectomy and history of retinal surgery  . HTN (hypertension)   . Seizure disorder   . Seasonal allergic rhinitis   . Depression      Surgical History:  Past Surgical History  Procedure Date  . Coronary artery bypass graft: left internal mammary artery LAD, saphenous vein graft to diagonal, saphenous vein graft to circumflex marginal, saphenous vein graft to posterior descending 09-2009  . Uvulopalatoplasty surgery   . Tonsillectomy     multiple cardiac catheterizations    . Release of right transverse carpal ligament   . Right eye vitrectomy and detached retina repair 10/2007    Current facility-administered medications:nitroGLYCERIN (NITROSTAT) SL tablet 0.4 mg,  Current outpatient prescriptions: acetaminophen (TYLENOL) 500 MG tablet, Take 1,000 mg by mouth every 6 (six) hours as needed. As  aspirin 81 MG tablet, Take 81 mg by mouth daily.  , Disp: , Rfl: ;   azithromycin (ZITHROMAX) 500 MG tablet, Take 500 mg by mouth daily. Started z-pack  05/31/11  benzonatate (TESSALON) 100 MG capsule, Take 100 mg by mouth 3 (three) times daily as needed. : ;  carbamazepine (TEGRETOL XR) 200 MG 12 hr tablet, Take 600 mg by mouth 2 (two) times daily. ,   docusate sodium (COLACE) 100 MG capsule, Take 100 mg by mouth daily.  , Disp: , Rfl: ;   ezetimibe (ZETIA) 10 MG tablet, Take 5 mg by mouth daily.  , Disp: , Rfl:  guaiFENesin (MUCINEX) 600 MG 12 hr tablet, Take 600-1,200 mg by mouth 2 (two) times daily.   insulin aspart (NOVOLOG) 100 UNIT/ML  injection, Inject into the skin. Insulin pump. Use as directed,  levETIRAcetam (KEPPRA) 500 MG tablet, Take 1/2 tablet in the morning and 1 tablet in the evening , ;  lisinopril (PRINIVIL,ZESTRIL) 10 MG tablet, Take 5 mg by mouth daily.  , Disp: , Rfl:  metoprolol tartrate (LOPRESSOR) 25 MG tablet, take 1 tablet by mouth twice a day, Disp: 60 tablet,  multivitamin (ONE-A-DAY MEN'S) TABS, Take 1 tablet by mouth daily.  , Disp: , Rfl: ;   Oxygen at 2 liters/min at night , Disp: , Rfl: ;   prasugrel (EFFIENT) 10 MG TABS, Take 1 tablet (10 mg total) by mouth daily., Disp: 30 tablet, Rfl: 6;  rosuvastatin (CRESTOR) 20 MG tablet, Take 20 mg by mouth daily.  , Disp: , Rfl:  sertraline (ZOLOFT) 100 MG tablet, Take 100 mg by mouth at bedtime. , Disp: , Rfl: ;  nitroGLYCERIN (NITROSTAT) 0.4 MG SL tablet, Place 1 tablet (0.4 mg total) under the tongue every 5 (five) minutes    Allergies  Allergen Reactions  . Latex Rash    History   Social History  . Marital Status: Married    Spouse Name: Almyra Free    Number of Children: 0  . Years of Education: N/A   Occupational History  . Unemployed     previously worked in [employer]   Social History Main Topics  . Smoking status: Never Smoker   . Smokeless tobacco: Not on file  . Alcohol Use: No  . Drug Use: Not on file  . Sexually Active: Not on file    Family History  Problem Relation Age of Onset  . Coronary artery disease Father   . Diabetes Father     type II  . Hypertension Father   . Squamous cell carcinoma Brother   . Arthritis Sister     Review of Systems: General: negative for chills, fever, night sweats or weight changes.  Cardiovascular: negative for dyspnea on exertion, edema, orthopnea, palpitations, paroxysmal nocturnal dyspnea or shortness of breath. He does have symptoms of chronic fatigue. Dermatological: negative for rash Respiratory: negative for cough or wheezing Urologic: negative for hematuria Abdominal: negative for nausea,  vomiting, diarrhea, bright red blood per rectum, melena, or hematemesis Neurologic: negative for visual changes, syncope, or dizziness All other systems reviewed and are otherwise negative except as noted above.  Labs:   Lab Results  Component Value Date   WBC 5.0 06/02/2011   HGB 14.3 06/02/2011   HCT 41.4 06/02/2011   MCV 88.7 06/02/2011   PLT 200 06/02/2011    Lab 06/02/11 1352  NA 138  K 4.6  CL 103  CO2 28  BUN 10  CREATININE 0.71  CALCIUM 9.1  PROT --  BILITOT --  ALKPHOS --  ALT --  AST --  GLUCOSE 146*    Basename 06/02/11 1352  CKTOTAL --  CKMB --  TROPONINI <0.30  Lab Results  Component Value Date   CHOL  Value: 137        ATP III CLASSIFICATION:  <200     mg/dL   Desirable  409-811  mg/dL   Borderline High  >=914    mg/dL   High        78/29/5621   HDL 43 04/15/2010   LDLCALC  Value: 63        Total Cholesterol/HDL:CHD Risk Coronary Heart Disease Risk Table                     Men   Women  1/2 Average Risk   3.4   3.3  Average Risk       5.0   4.4  2 X Average Risk   9.6   7.1  3 X Average Risk  23.4   11.0        Use the calculated Patient Ratio above and the CHD Risk Table to determine the patient's CHD Risk.        ATP III CLASSIFICATION (LDL):  <100     mg/dL   Optimal  308-657  mg/dL   Near or Above                    Optimal  130-159  mg/dL   Borderline  846-962  mg/dL   High  >952     mg/dL   Very High 84/13/2440   TRIG 153* 04/15/2010   Lab Results  Component Value Date   DDIMER  Value: <0.22        AT THE INHOUSE ESTABLISHED CUTOFF VALUE OF 0.48 ug/mL FEU, THIS ASSAY HAS BEEN DOCUMENTED IN THE LITERATURE TO HAVE A SENSITIVITY AND NEGATIVE PREDICTIVE VALUE OF AT LEAST 98 TO 99%.  THE TEST RESULT SHOULD BE CORRELATED WITH AN ASSESSMENT OF THE CLINICAL PROBABILITY OF DVT / VTE. 10/12/2009    Radiology/Studies:  Dg Chest Port 1 View 06/02/2011 PORTABLE CHEST - 1 VIEW  Comparison: Portable exam 1352 hours compared to 07/29/2010  Findings: Upper normal heart  size post CABG. Mediastinal contours and pulmonary vascularity normal. Lungs clear. No pleural effusion or pneumothorax Mildly lordotic positioning. Bones unremarkable.   IMPRESSION: Post CABG. No acute abnormalities.     EKG: Normal sinus rhythm with no acute ST or T wave changes. He has slight ST elevation in leads 2, 3, and aVF which are similar to prior ECG in October of 2011.  Physical Exam: Blood pressure 114/53, pulse 73, temperature 97.9 F (36.6 C), temperature source Oral, resp. rate 13, SpO2 99.00%. General: Well developed, well nourished, in no acute distress. Head: Neurofibromatous changes in the forehead, atraumatic, sclera non-icteric, no xanthomas, nares are without discharge.  Neck: Negative for carotid bruits. JVD not elevated. Lungs: Clear bilaterally to auscultation without wheezes, rales, or rhonchi. Breathing is unlabored. Heart: RRR with S1 S2. No murmurs, rubs, or gallops appreciated. Abdomen: Soft, non-tender, non-distended with normoactive bowel sounds. No hepatomegaly. No rebound/guarding. No obvious abdominal masses. Msk:  Strength and tone appear normal for age. Extremities: No clubbing or cyanosis. No edema.  Distal pedal pulses are 2+ and equal bilaterally. Neuro: Alert and oriented X 3. Moves all extremities spontaneously. Psych:  Responds to questions appropriately with a normal affect.  ASSESSMENT AND PLAN:  1. Chest pain. Symptoms are consistent with new onset angina. He has had prior coronary bypass surgery with subsequent graft failure. He had multiple vessel PCI one year ago. The symptoms  may be confounded by his recent upper respiratory illness. Symptoms are similar but less intense than prior anginal symptoms. I recommended admission overnight for serial cardiac enzymes and ECGs. We will anticoagulate with Lovenox. We'll continue his prior medications. I discussed at length with the patient potential further evaluation including cardiac catheterization. He  is unclear at this time whether he would like to proceed. If his cardiac enzymes are abnormal then clearly he should undergo coronary angiography. If his symptoms resolve and his enzymes are negative the other option would be to continue medical therapy and plan on proceeding with a stress Myoview study as was previously planned. 2. Coronary disease with prior CABG in April 2011. Subsequent graft failure. He has had stenting of the mid and distal LAD and balloon angioplasty of vein graft to the diagonal. 3. Insulin-dependent diabetes mellitus. 4. Neurofibromatosis. 5. Hyperlipidemia. 6. Sleep apnea. 7. Hypertension.  Signed, Bjorn Loser Barrett PA-C 06/02/2011, 3:55 PM History and exam above performed by me Lakeya Mulka [place] MD 06/02/11, 4:42 PM

## 2011-06-02 NOTE — ED Notes (Signed)
Patient was in rehab today for a evaluation and while on the treadmill he started to have chest pain. Therapist working with him sat patient in chair patient in chair and did EKG, called cardiac doctor and was told to bring him to the ED.

## 2011-06-02 NOTE — Telephone Encounter (Signed)
Byrd Hesselbach called back and the pt's CP has returned and is not resolving.  Byrd Hesselbach is going to take the pt to the ER for further evaluation. I made her aware that our office will await the pt's evaluation in the ER.

## 2011-06-02 NOTE — Progress Notes (Signed)
Patient complained of mild chest discomfort on the treadmill. Patient notified the exercise specialist Cristy Hilts.  Blood pressure 118/62.  Patient placed on 4l/min of nasal oxygen. Patient reports relief of chest discomfort after oxygen placed on.  Lauren Dr Earmon Phoenix nurse notified of patients complaint. Exercise stopped.  [name] initially reported his chest discomfort a 2 on a 1-10 scale. Patient continued to complain of chest discomfort despite oxygen administration. Patient placed on Zoll.  Telemetry sinus rhythm.  Patient taken to the emergency department for further evaluation of chest discomfort.  Dr Earmon Phoenix office notified.  Rhonda Barrett PAC notified.  Patient transported to the ED via stretcher on 4l/min of nasal oxygen.  Patient called his wife to update her.  Report given to ED RN.

## 2011-06-03 ENCOUNTER — Ambulatory Visit: Payer: BC Managed Care – PPO | Admitting: Physician Assistant

## 2011-06-03 LAB — COMPREHENSIVE METABOLIC PANEL
AST: 34 U/L (ref 0–37)
Albumin: 3.9 g/dL (ref 3.5–5.2)
CO2: 28 mEq/L (ref 19–32)
Calcium: 9.2 mg/dL (ref 8.4–10.5)
Creatinine, Ser: 0.71 mg/dL (ref 0.50–1.35)
GFR calc non Af Amer: >90 mL/min (ref >90)

## 2011-06-03 LAB — GLUCOSE, CAPILLARY: Glucose-Capillary: 95 mg/dL (ref 70–99)

## 2011-06-03 LAB — CARDIAC PANEL(CRET KIN+CKTOT+MB+TROPI)
Relative Index: INVALID (ref 0.0–2.5)
Total CK: 37 U/L (ref 7–232)

## 2011-06-03 LAB — LIPID PANEL
Cholesterol: 138 mg/dL (ref 0–200)
VLDL: 22 mg/dL (ref 0–40)

## 2011-06-03 NOTE — Plan of Care (Signed)
Problem: Phase II Progression Outcomes Goal: Stress Test if indicated Outcome: Completed/Met Date Met:  06/03/11 Scheduled for outpt. Goal: Cardiac Rehab if ordered Outcome: Completed/Met Date Met:  06/03/11 Pt was  in cardiac rehab prior to admission

## 2011-06-03 NOTE — Progress Notes (Signed)
SUBJECTIVE: No chest pain or SOB.   BP 148/76  Pulse 85  Temp(Src) 98.1 F (36.7 C) (Oral)  Resp 16  Ht 5\' 11"  (1.803 m)  Wt 230 lb 8 oz (104.554 kg)  BMI 32.15 kg/m2  SpO2 96%  No intake or output data in the 24 hours ending 06/03/11 0729  PHYSICAL EXAM General: Well developed, well nourished, in no acute distress. Alert and oriented x 3.  Psych:  Good affect, responds appropriately Neck: No JVD. No masses noted.  Lungs: Clear bilaterally with no wheezes or rhonci noted.  Heart: RRR with no murmurs noted. Abdomen: Bowel sounds are present. Soft, non-tender.  Extremities: No lower extremity edema.   LABS: Basic Metabolic Panel:  Basename 06/02/11 1352  NA 138  K 4.6  CL 103  CO2 28  GLUCOSE 146*  BUN 10  CREATININE 0.71  CALCIUM 9.1  MG --  PHOS --   CBC:  Basename 06/02/11 1352  WBC 5.0  NEUTROABS --  HGB 14.3  HCT 41.4  MCV 88.7  PLT 200   Cardiac Enzymes:  Basename 06/02/11 2345 06/02/11 1839 06/02/11 1352  CKTOTAL 37 43 --  CKMB 1.9 1.9 --  CKMBINDEX -- -- --  TROPONINI <0.30 <0.30 <0.30    Current Meds:    . aspirin EC  81 mg Oral Daily  . azithromycin  500 mg Oral Daily  . carbamazepine  600 mg Oral BID  . docusate sodium  100 mg Oral Daily  . enoxaparin (LOVENOX) injection  100 mg Subcutaneous Q12H  . ezetimibe  5 mg Oral Daily  . guaiFENesin  600 mg Oral BID  . levETIRAcetam  500 mg Oral BID  . lisinopril  5 mg Oral Daily  . metoprolol tartrate  25 mg Oral BID  . multivitamins ther. w/minerals  1 tablet Oral Daily  . prasugrel  10 mg Oral Daily  . rosuvastatin  20 mg Oral Daily  . sertraline  100 mg Oral QHS  . DISCONTD: enoxaparin (LOVENOX) injection  100 mg Subcutaneous To Minor  . DISCONTD: enoxaparin (LOVENOX) injection  100 mg Subcutaneous Q12H  . DISCONTD: multivitamin  1 tablet Oral Daily  . DISCONTD: NON FORMULARY  2 L/min Inhalation QHS     ASSESSMENT AND PLAN:  1) CAD: Admitted with chest pain. Cardiac enzymes  negative x 3. He is known to have severe CAD s/p previous CABG stenting of the LAD and PCI of the SVG to the diagonal 2011.  His pain was atypical after exercise. He has a stress test arranged for January per Dr. Excell Seltzer for fatigue. Will have pt ambulate this am. If no recurrent CP, will d/c home. We will try to move his stress test up to next week. Ok to eat breakfast. F/U Dr. Excell Seltzer 2 weeks.   Jeff [name]  12/6/20127:29 AM

## 2011-06-03 NOTE — Discharge Summary (Signed)
Discharge Summary   Patient ID: Jeff [name] MRN: [id number], DOB/AGE: [birth year] 45 y.o.  Primary MD: Ezequiel Kayser, MD, MD Primary Cardiologist: Tonny Bollman MD  Admit date: 06/02/2011 D/C date:     06/03/2011      Primary Discharge Diagnoses:  1. Chest pain, Atypical  - negative enzymes x 3   - EKG without acute ischemic changes  - Outpatient stress test scheduled   Secondary Discharge Diagnoses:  1. CAD s/p 4V CABG 09/2009 and subsequent PCI with stenting of mid & distal LAD and PTCA of SVG to 1st diagonal 02/2010 2. HTN 3. HLD - LDL 74 4. DM Type 1 - Insulin pump  Allergies Allergies  Allergen Reactions  . Latex Rash   Diagnostic Studies/Procedures:  None  History of Present Illness: 45yom w/ PMHx significant for CAD, HTN, HLD, & DM who was admitted to [facility] on 06/02/11 for chest pain while at cardiac rehab.   Hospital Course: In the ED he was given sublingual nitro with resolution of his chest pain. His EKG was without acute ischemic changes, initial troponin was negative, and CXR was without any acute abnormalities. Due to his significant cardiac history and risk factors he was admitted for further evaluation and treatment. Cardiac enzymes were cycled and negative. He was able to ambulate without any further chest pain. He is stable for discharge to home today. He will undergo a stress test early next week. It is recommended he not participate in cardiac rehab until after the stress test if cleared by Dr. Excell Seltzer.   Discharge Vitals: Blood pressure 148/76, pulse 85, temperature 98.1 F (36.7 C), temperature source Oral, resp. rate 16, height 5\' 11"  (1.803 m), weight 104.554 kg (230 lb 8 oz), SpO2 96.00%.  Labs: Lab Results  Component Value Date   WBC 5.0 06/02/2011   HGB 14.3 06/02/2011   HCT 41.4 06/02/2011   MCV 88.7 06/02/2011   PLT 200 06/02/2011     Lab 06/03/11 0640  NA 140  K 4.4  CL 105  CO2 28  BUN 10  CREATININE 0.71  CALCIUM 9.2    PROT 6.6  BILITOT 0.3  ALKPHOS 83  ALT 37  AST 34  GLUCOSE 84    Basename 06/03/11 0640 06/02/11 2345 06/02/11 1839 06/02/11 1352  CKTOTAL 38 37 43 --  CKMB 1.8 1.9 1.9 --  TROPONINI <0.30 <0.30 <0.30 <0.30   Lab Results  Component Value Date   CHOL 138 06/03/2011   HDL 42 06/03/2011   LDLCALC 74 06/03/2011   TRIG 111 06/03/2011    06/02/2011 18:38  Hemoglobin A1C 6.4 (H)    06/02/2011 18:38  TSH 0.319 (L)    Discharge Medications   Current Discharge Medication List    CONTINUE these medications which have NOT CHANGED   Details  acetaminophen (TYLENOL) 500 MG tablet Take 1,000 mg by mouth every 6 (six) hours as needed. As needed for pain.     aspirin 81 MG tablet Take 81 mg by mouth daily.      benzonatate (TESSALON) 100 MG capsule Take 100 mg by mouth 3 (three) times daily as needed. As needed for cough.    carbamazepine (TEGRETOL XR) 200 MG 12 hr tablet Take 600 mg by mouth 2 (two) times daily.     docusate sodium (COLACE) 100 MG capsule Take 100 mg by mouth daily.      ezetimibe (ZETIA) 10 MG tablet Take 5 mg by mouth daily.  guaiFENesin (MUCINEX) 600 MG 12 hr tablet Take 600-1,200 mg by mouth 2 (two) times daily. As needed for congestion.    insulin aspart (NOVOLOG) 100 UNIT/ML injection Inject into the skin. Insulin pump. Use as directed    levETIRAcetam (KEPPRA) 500 MG tablet Take 1/2 tablet in the morning and 1 tablet in the evening     lisinopril (PRINIVIL,ZESTRIL) 10 MG tablet Take 5 mg by mouth daily.      metoprolol tartrate (LOPRESSOR) 25 MG tablet take 1 tablet by mouth twice a day Qty: 60 tablet, Refills: 5    multivitamin (ONE-A-DAY MEN'S) TABS Take 1 tablet by mouth daily.      NON FORMULARY Oxygen at 2 liters/min at night     prasugrel (EFFIENT) 10 MG TABS Take 1 tablet (10 mg total) by mouth daily. Qty: 30 tablet, Refills: 6    rosuvastatin (CRESTOR) 20 MG tablet Take 20 mg by mouth daily.      sertraline (ZOLOFT) 100 MG tablet Take  100 mg by mouth at bedtime.     nitroGLYCERIN (NITROSTAT) 0.4 MG SL tablet Place 1 tablet (0.4 mg total) under the tongue every 5 (five) minutes as needed. Qty: 25 tablet, Refills: 3   Associated Diagnoses: Coronary atherosclerosis of autologous vein bypass graft      STOP taking these medications     azithromycin (ZITHROMAX) 500 MG tablet         Disposition    Follow-up Information    Follow up with PERINI,MARK A, MD. Make an appointment in 1 week.      Follow up with [facility] on 06/08/2011. (Stress Test @ 9:00a)    Contact information:   [address] [place] [address] [phone]      Follow up with Tonny Bollman, MD on 06/17/2011. (10:00)    Contact information:   [facility] [address] [place] [address] [phone]      Outstanding Labs/Studies: Stress test as above  Duration of Discharge Encounter: Greater than 30 minutes including physician and PA time.  Signed, Jordana Dugue PA-C 06/03/2011, 10:56 AM

## 2011-06-03 NOTE — Discharge Summary (Signed)
Pt seen this am and full note in chart.   [name],Jeff 11:30 AM

## 2011-06-04 ENCOUNTER — Encounter (HOSPITAL_COMMUNITY): Admission: RE | Admit: 2011-06-04 | Payer: Self-pay | Source: Ambulatory Visit

## 2011-06-07 ENCOUNTER — Encounter (HOSPITAL_COMMUNITY): Admission: RE | Admit: 2011-06-07 | Payer: Self-pay | Source: Ambulatory Visit

## 2011-06-08 ENCOUNTER — Ambulatory Visit (HOSPITAL_COMMUNITY): Payer: BC Managed Care – PPO | Attending: Cardiology | Admitting: Radiology

## 2011-06-08 VITALS — BP 118/52 | Ht 71.0 in | Wt 231.0 lb

## 2011-06-08 DIAGNOSIS — I10 Essential (primary) hypertension: Secondary | ICD-10-CM | POA: Insufficient documentation

## 2011-06-08 DIAGNOSIS — R0989 Other specified symptoms and signs involving the circulatory and respiratory systems: Secondary | ICD-10-CM | POA: Insufficient documentation

## 2011-06-08 DIAGNOSIS — R0609 Other forms of dyspnea: Secondary | ICD-10-CM | POA: Insufficient documentation

## 2011-06-08 DIAGNOSIS — R0602 Shortness of breath: Secondary | ICD-10-CM

## 2011-06-08 DIAGNOSIS — R079 Chest pain, unspecified: Secondary | ICD-10-CM

## 2011-06-08 DIAGNOSIS — E119 Type 2 diabetes mellitus without complications: Secondary | ICD-10-CM

## 2011-06-08 DIAGNOSIS — Z87891 Personal history of nicotine dependence: Secondary | ICD-10-CM | POA: Insufficient documentation

## 2011-06-08 DIAGNOSIS — I2510 Atherosclerotic heart disease of native coronary artery without angina pectoris: Secondary | ICD-10-CM

## 2011-06-08 DIAGNOSIS — I252 Old myocardial infarction: Secondary | ICD-10-CM | POA: Insufficient documentation

## 2011-06-08 DIAGNOSIS — Z951 Presence of aortocoronary bypass graft: Secondary | ICD-10-CM | POA: Insufficient documentation

## 2011-06-08 DIAGNOSIS — R5381 Other malaise: Secondary | ICD-10-CM | POA: Insufficient documentation

## 2011-06-08 DIAGNOSIS — Z794 Long term (current) use of insulin: Secondary | ICD-10-CM | POA: Insufficient documentation

## 2011-06-08 DIAGNOSIS — I25810 Atherosclerosis of coronary artery bypass graft(s) without angina pectoris: Secondary | ICD-10-CM | POA: Insufficient documentation

## 2011-06-08 MED ORDER — TECHNETIUM TC 99M TETROFOSMIN IV KIT
33.0000 | PACK | Freq: Once | INTRAVENOUS | Status: AC | PRN
  Administered 2011-06-08: 33 via INTRAVENOUS

## 2011-06-08 MED ORDER — TECHNETIUM TC 99M TETROFOSMIN IV KIT
11.0000 | PACK | Freq: Once | INTRAVENOUS | Status: AC | PRN
  Administered 2011-06-08: 11 via INTRAVENOUS

## 2011-06-08 NOTE — Progress Notes (Signed)
[facility] SITE 3 NUCLEAR MED [address] [place] [address] [phone]  Cardiology Nuclear Med Study  XYON LUKASIK is a 45 y.o. male [id number] 03-10-1966   Nuclear Med Background Indication for Stress Test:  Evaluation for Ischemia, Graft Patency, Stent Patency, PTCA Patency and  06/02/11 Post Hospital: CP, (-) enzymes History:  911 Angioplasty: grafts, 4/11CABG x 4, 11Echo:EF=60-65%, 10/11 Heart Catheterization:Patent graft/stent, 4/11 Myocardial Infarction:NSTEMI and 9/11 Stents:LAD Cardiac Risk Factors: Hypertension and IDDM Type 2  Symptoms:  Chest Pain with Exertion (last date of chest discomfort 12/5), DOE, Fatigue and SOB   Nuclear Pre-Procedure Caffeine/Decaff Intake:  None NPO After: 7:30pm   Lungs:  clear IV 0.9% NS with Angio Cath:  20g  IV Site: R Antecubital  IV Started by:  Stanton Kidney, EMT-P  Chest Size (in):  46 Cup Size: n/a  Height: 5\' 11"  (1.803 m)  Weight:  231 lb (104.781 kg)  BMI:  Body mass index is 32.22 kg/(m^2). Tech Comments:  Metoprolol held > 22 hours, CBG=219 @ 8:11 am, per patient.    Nuclear Med Study 1 or 2 day study: 1 day  Stress Test Type:  Stress  Reading MD: Willa Rough, MD  Order Authorizing Provider:  Casimiro Needle Cooper,MD  Resting Radionuclide: Technetium 87m Tetrofosmin  Resting Radionuclide Dose: 11.0 mCi   Stress Radionuclide:  Technetium 64m Tetrofosmin  Stress Radionuclide Dose: 33.0 mCi           Stress Protocol Rest HR: 101 Stress HR: 148  Rest BP: 118/52 Stress BP: 184/73  Exercise Time (min): 6:29 METS: 7.0   Predicted Max HR: 173 bpm % Max HR: 85.55 bpm    Dose of Adenosine (mg):  n/a Dose of Lexiscan: n/a mg  Dose of Atropine (mg): n/a Dose of Dobutamine: n/a mcg/kg/min (at max HR)  Stress Test Technologist: Cathlyn Parsons, RN  Nuclear Technologist:  Domenic Polite, CNMT     Rest Procedure:  Myocardial perfusion imaging was performed at rest 45 minutes following the intravenous  administration of Technetium 79m Tetrofosmin. Rest ECG: Sinus Tachycardia  Stress Procedure:  The patient exercised for 6:29.  The patient stopped due to severe SOB and dizziness. Patient had chest pain 2/10 with exercise and relieved in recovery.  There were significant ST-T wave changes.  Technetium 13m Tetrofosmin was injected at peak exercise and myocardial perfusion imaging was performed after a brief delay. Stress ECG: No significant change from baseline ECG  QPS Raw Data Images:  Patient motion noted; appropriate software correction applied. Stress Images:  Normal homogeneous uptake in all areas of the myocardium. Rest Images:  Normal homogeneous uptake in all areas of the myocardium. Subtraction (SDS):  No evidence of ischemia. Transient Ischemic Dilatation (Normal <1.22):  0.98 Lung/Heart Ratio (Normal <0.45):  0.28  Quantitative Gated Spect Images QGS EDV:  59 ml QGS ESV:  13 ml QGS cine images:  Normal Wall Motion QGS EF: 79%  Impression Exercise Capacity:  Limited BP Response:  Hypertensive blood pressure response. Clinical Symptoms:  SOB ECG Impression:  No significant ST segment change suggestive of ischemia. Comparison with Prior Nuclear Study: No previous nuclear study performed  Overall Impression:  Normal stress nuclear study. Limited exercise capacity.  Tinnie Gens Adeeb Konecny,MD

## 2011-06-09 ENCOUNTER — Encounter (HOSPITAL_COMMUNITY): Payer: Self-pay

## 2011-06-09 ENCOUNTER — Telehealth: Payer: Self-pay | Admitting: Cardiovascular Disease

## 2011-06-09 NOTE — Telephone Encounter (Signed)
Will forward to Dr. Excell Seltzer.Please advise

## 2011-06-09 NOTE — Telephone Encounter (Signed)
Pt here yesterday for stress test, pt needs order to rtn to cardiac rehab if dr cooper wants him to do that or light exercise at home?

## 2011-06-11 ENCOUNTER — Encounter (HOSPITAL_COMMUNITY): Payer: Self-pay

## 2011-06-12 NOTE — Telephone Encounter (Signed)
He's fine to return to cardiac rehab as stress test was normal

## 2011-06-14 ENCOUNTER — Encounter (HOSPITAL_COMMUNITY)
Admission: RE | Admit: 2011-06-14 | Discharge: 2011-06-14 | Disposition: A | Discharge status: Home / Self Care | Payer: Self-pay | Source: Ambulatory Visit | Attending: Cardiovascular Disease | Admitting: Cardiovascular Disease

## 2011-06-15 NOTE — Telephone Encounter (Signed)
Pt was made aware of normal stress test results by Deliah Goody RN and he returned to cardiac rehab on 06/14/11.

## 2011-06-16 ENCOUNTER — Encounter (HOSPITAL_COMMUNITY)
Admission: RE | Admit: 2011-06-16 | Discharge: 2011-06-16 | Disposition: A | Discharge status: Home / Self Care | Payer: Self-pay | Source: Ambulatory Visit | Attending: Cardiovascular Disease | Admitting: Cardiovascular Disease

## 2011-06-17 ENCOUNTER — Ambulatory Visit (INDEPENDENT_AMBULATORY_CARE_PROVIDER_SITE_OTHER): Payer: BC Managed Care – PPO | Admitting: Cardiovascular Disease

## 2011-06-17 ENCOUNTER — Encounter: Payer: Self-pay | Admitting: Cardiovascular Disease

## 2011-06-17 VITALS — BP 110/70 | HR 76 | Ht 71.0 in | Wt 234.0 lb

## 2011-06-17 DIAGNOSIS — I25810 Atherosclerosis of coronary artery bypass graft(s) without angina pectoris: Secondary | ICD-10-CM

## 2011-06-17 NOTE — Progress Notes (Signed)
HPI:  [name] is a 45 year old gentleman presenting for followup evaluation.  He was just seen December 4 for regular followup.  The patient has extensive coronary artery disease with history of coronary bypass surgery in 2011 with early graft failure. He has then undergone multivessel stenting using drug-eluting stent platforms. He was at cardiac rehab about 2 weeks ago and developed mild chest discomfort with exercise. His symptoms persisted after exercise and was recommended that he go to the emergency department for evaluation. He was observed overnight and ruled out for myocardial infarction. He was discharged home and underwent an outpatient stress Myoview scan that showed no ischemia. His left ventricular ejection fraction is normal.  He denies recurrent chest pain.  He did have shortness of breath at the time of his stress test. He's had no leg swelling or other complaints.  Outpatient Encounter Prescriptions as of 06/17/2011  Medication Sig Dispense Refill  . acetaminophen (TYLENOL) 500 MG tablet Take 1,000 mg by mouth every 6 (six) hours as needed. As needed for pain.       [name] aspirin 81 MG tablet Take 81 mg by mouth daily.        . benzonatate (TESSALON) 100 MG capsule Take 100 mg by mouth 3 (three) times daily as needed. As needed for cough.      . carbamazepine (TEGRETOL XR) 200 MG 12 hr tablet Take 600 mg by mouth 2 (two) times daily.       [name] docusate sodium (COLACE) 100 MG capsule Take 100 mg by mouth daily.        [name] ezetimibe (ZETIA) 10 MG tablet Take 5 mg by mouth daily.        [name] guaiFENesin (MUCINEX) 600 MG 12 hr tablet Take 600-1,200 mg by mouth 2 (two) times daily. As needed for congestion.      . insulin aspart (NOVOLOG) 100 UNIT/ML injection Inject into the skin. Insulin pump. Use as directed      . levETIRAcetam (KEPPRA) 500 MG tablet Take 1/2 tablet in the morning and 1 tablet in the evening      . lisinopril (PRINIVIL,ZESTRIL) 10 MG tablet Take 5 mg by mouth daily.       .  metoprolol tartrate (LOPRESSOR) 25 MG tablet take 1 tablet by mouth twice a day  60 tablet  5  . multivitamin (ONE-A-DAY MEN'S) TABS Take 1 tablet by mouth daily.       . nitroGLYCERIN (NITROSTAT) 0.4 MG SL tablet Place 1 tablet (0.4 mg total) under the tongue every 5 (five) minutes as needed.  25 tablet  3  . NON FORMULARY Oxygen at 2 liters/min at night       . prasugrel (EFFIENT) 10 MG TABS Take 1 tablet (10 mg total) by mouth daily.  30 tablet  6  . rosuvastatin (CRESTOR) 20 MG tablet Take 20 mg by mouth daily.        . sertraline (ZOLOFT) 100 MG tablet Take 100 mg by mouth at bedtime. One and half tablet at bedtime        Allergies  Allergen Reactions  . Latex Rash    Past Medical History  Diagnosis Date  . CAD (coronary artery disease)     s/p CABG, NSTEMI  . Diabetes mellitus type I     on insulin pump at  home  . Sleep apnea     s/p oral surgery  . Hyperlipidemia   . 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 Diabetic retinopathy     s/p vitrectomy and history of retinal surgery  . HTN (hypertension)   . Seizure disorder   . Seasonal allergic rhinitis   . Depression     ROS: Negative except as per HPI  BP 110/70  Pulse 76  Ht 5\' 11"  (1.803 m)  Wt 106.142 kg (234 lb)  BMI 32.64 kg/m2  PHYSICAL EXAM: Pt is alert and oriented, NAD HEENT: normal Neck: JVP - normal, carotids 2+= without bruits Lungs: CTA bilaterally CV: RRR without murmur or gallop Abd: soft, NT, Positive BS, no hepatomegaly Ext: no C/C/E, distal pulses intact and equal Skin: warm/dry no rash  EKG:  Normal sinus rhythm 76 beats per minute, within normal limits.  ASSESSMENT AND PLAN:

## 2011-06-17 NOTE — Patient Instructions (Signed)
Your physician wants you to follow-up in: 6 MONTHS.  You will receive a reminder letter in the mail two months in advance. If you don't receive a letter, please call our office to schedule the follow-up appointment.  Your physician recommends that you continue on your current medications as directed. Please refer to the Current Medication list given to you today.  

## 2011-06-17 NOTE — Assessment & Plan Note (Signed)
The patient appears stable. I am reassured by his stress test results. He will continue his current medical program. We'll long discussion today about exercise parameters. I don't think that he needs to follow a strict criteria with respect to heart rate. I recommended that he allow his symptoms to dictate how much he pushes himself. If he develops mild angina than he can simply slow down or stop until symptoms resolve. We discussed the use of sublingual nitroglycerin he understands how to use this. If he notes a change in his anginal pattern I asked him to contact us immediately. He will continue ongoing antiplatelet therapy with aspirin and effient. We discussed the potential change to Plavix down around but I would favor keeping him on effient considering his diabetes and extensive CAD.

## 2011-06-18 ENCOUNTER — Encounter (HOSPITAL_COMMUNITY)
Admission: RE | Admit: 2011-06-18 | Discharge: 2011-06-18 | Disposition: A | Discharge status: Home / Self Care | Payer: Self-pay | Source: Ambulatory Visit | Attending: Cardiovascular Disease | Admitting: Cardiovascular Disease

## 2011-06-21 ENCOUNTER — Encounter (HOSPITAL_COMMUNITY): Payer: Self-pay

## 2011-06-23 ENCOUNTER — Encounter (HOSPITAL_COMMUNITY): Payer: Self-pay

## 2011-06-25 ENCOUNTER — Encounter (HOSPITAL_COMMUNITY): Payer: Self-pay

## 2011-06-28 ENCOUNTER — Encounter (HOSPITAL_COMMUNITY): Payer: Self-pay

## 2011-06-30 ENCOUNTER — Encounter (HOSPITAL_COMMUNITY): Payer: Self-pay

## 2011-07-02 ENCOUNTER — Encounter (HOSPITAL_COMMUNITY)
Admission: RE | Admit: 2011-07-02 | Discharge: 2011-07-02 | Disposition: A | Discharge status: Home / Self Care | Payer: Self-pay | Source: Ambulatory Visit | Attending: Cardiovascular Disease | Admitting: Cardiovascular Disease

## 2011-07-02 DIAGNOSIS — F341 Dysthymic disorder: Secondary | ICD-10-CM | POA: Insufficient documentation

## 2011-07-02 DIAGNOSIS — Z79899 Other long term (current) drug therapy: Secondary | ICD-10-CM | POA: Insufficient documentation

## 2011-07-02 DIAGNOSIS — G4733 Obstructive sleep apnea (adult) (pediatric): Secondary | ICD-10-CM | POA: Insufficient documentation

## 2011-07-02 DIAGNOSIS — Z5189 Encounter for other specified aftercare: Secondary | ICD-10-CM | POA: Insufficient documentation

## 2011-07-02 DIAGNOSIS — E785 Hyperlipidemia, unspecified: Secondary | ICD-10-CM | POA: Insufficient documentation

## 2011-07-02 DIAGNOSIS — Z9641 Presence of insulin pump (external) (internal): Secondary | ICD-10-CM | POA: Insufficient documentation

## 2011-07-02 DIAGNOSIS — I498 Other specified cardiac arrhythmias: Secondary | ICD-10-CM | POA: Insufficient documentation

## 2011-07-02 DIAGNOSIS — I2510 Atherosclerotic heart disease of native coronary artery without angina pectoris: Secondary | ICD-10-CM | POA: Insufficient documentation

## 2011-07-02 DIAGNOSIS — Q8500 Neurofibromatosis, unspecified: Secondary | ICD-10-CM | POA: Insufficient documentation

## 2011-07-02 DIAGNOSIS — Z794 Long term (current) use of insulin: Secondary | ICD-10-CM | POA: Insufficient documentation

## 2011-07-02 DIAGNOSIS — I252 Old myocardial infarction: Secondary | ICD-10-CM | POA: Insufficient documentation

## 2011-07-02 DIAGNOSIS — Z951 Presence of aortocoronary bypass graft: Secondary | ICD-10-CM | POA: Insufficient documentation

## 2011-07-02 DIAGNOSIS — I10 Essential (primary) hypertension: Secondary | ICD-10-CM | POA: Insufficient documentation

## 2011-07-02 DIAGNOSIS — I319 Disease of pericardium, unspecified: Secondary | ICD-10-CM | POA: Insufficient documentation

## 2011-07-02 DIAGNOSIS — E109 Type 1 diabetes mellitus without complications: Secondary | ICD-10-CM | POA: Insufficient documentation

## 2011-07-05 ENCOUNTER — Encounter (HOSPITAL_COMMUNITY)
Admission: RE | Admit: 2011-07-05 | Discharge: 2011-07-05 | Disposition: A | Discharge status: Home / Self Care | Payer: Self-pay | Source: Ambulatory Visit | Attending: Cardiovascular Disease | Admitting: Cardiovascular Disease

## 2011-07-06 ENCOUNTER — Encounter (HOSPITAL_COMMUNITY): Payer: BC Managed Care – PPO | Admitting: Radiology

## 2011-07-07 ENCOUNTER — Encounter (HOSPITAL_COMMUNITY)
Admission: RE | Admit: 2011-07-07 | Discharge: 2011-07-07 | Disposition: A | Discharge status: Home / Self Care | Payer: Self-pay | Source: Ambulatory Visit | Attending: Cardiovascular Disease | Admitting: Cardiovascular Disease

## 2011-07-09 ENCOUNTER — Encounter (HOSPITAL_COMMUNITY)
Admission: RE | Admit: 2011-07-09 | Discharge: 2011-07-09 | Disposition: A | Discharge status: Home / Self Care | Payer: Self-pay | Source: Ambulatory Visit | Attending: Cardiovascular Disease | Admitting: Cardiovascular Disease

## 2011-07-12 ENCOUNTER — Encounter (HOSPITAL_COMMUNITY)
Admission: RE | Admit: 2011-07-12 | Discharge: 2011-07-12 | Disposition: A | Discharge status: Home / Self Care | Payer: Self-pay | Source: Ambulatory Visit | Attending: Cardiovascular Disease | Admitting: Cardiovascular Disease

## 2011-07-14 ENCOUNTER — Encounter (HOSPITAL_COMMUNITY)
Admission: RE | Admit: 2011-07-14 | Discharge: 2011-07-14 | Disposition: A | Discharge status: Home / Self Care | Payer: Self-pay | Source: Ambulatory Visit | Attending: Cardiovascular Disease | Admitting: Cardiovascular Disease

## 2011-07-16 ENCOUNTER — Encounter (HOSPITAL_COMMUNITY): Payer: Self-pay

## 2011-07-19 ENCOUNTER — Encounter (HOSPITAL_COMMUNITY)
Admission: RE | Admit: 2011-07-19 | Discharge: 2011-07-19 | Disposition: A | Discharge status: Home / Self Care | Payer: Self-pay | Source: Ambulatory Visit | Attending: Cardiovascular Disease | Admitting: Cardiovascular Disease

## 2011-07-21 ENCOUNTER — Encounter (HOSPITAL_COMMUNITY)
Admission: RE | Admit: 2011-07-21 | Discharge: 2011-07-21 | Disposition: A | Discharge status: Home / Self Care | Payer: Self-pay | Source: Ambulatory Visit | Attending: Cardiovascular Disease | Admitting: Cardiovascular Disease

## 2011-07-23 ENCOUNTER — Encounter (HOSPITAL_COMMUNITY)
Admission: RE | Admit: 2011-07-23 | Discharge: 2011-07-23 | Disposition: A | Discharge status: Home / Self Care | Payer: Self-pay | Source: Ambulatory Visit | Attending: Cardiovascular Disease | Admitting: Cardiovascular Disease

## 2011-07-26 ENCOUNTER — Encounter (HOSPITAL_COMMUNITY)
Admission: RE | Admit: 2011-07-26 | Discharge: 2011-07-26 | Disposition: A | Discharge status: Home / Self Care | Payer: Self-pay | Source: Ambulatory Visit | Attending: Cardiovascular Disease | Admitting: Cardiovascular Disease

## 2011-07-28 ENCOUNTER — Encounter (HOSPITAL_COMMUNITY)
Admission: RE | Admit: 2011-07-28 | Discharge: 2011-07-28 | Disposition: A | Discharge status: Home / Self Care | Payer: Self-pay | Source: Ambulatory Visit | Attending: Cardiovascular Disease | Admitting: Cardiovascular Disease

## 2011-07-30 ENCOUNTER — Encounter (HOSPITAL_COMMUNITY)
Admission: RE | Admit: 2011-07-30 | Discharge: 2011-07-30 | Disposition: A | Discharge status: Home / Self Care | Payer: Self-pay | Source: Ambulatory Visit | Attending: Cardiovascular Disease | Admitting: Cardiovascular Disease

## 2011-07-30 DIAGNOSIS — Z79899 Other long term (current) drug therapy: Secondary | ICD-10-CM | POA: Insufficient documentation

## 2011-07-30 DIAGNOSIS — E785 Hyperlipidemia, unspecified: Secondary | ICD-10-CM | POA: Insufficient documentation

## 2011-07-30 DIAGNOSIS — Z9641 Presence of insulin pump (external) (internal): Secondary | ICD-10-CM | POA: Insufficient documentation

## 2011-07-30 DIAGNOSIS — I319 Disease of pericardium, unspecified: Secondary | ICD-10-CM | POA: Insufficient documentation

## 2011-07-30 DIAGNOSIS — Q8500 Neurofibromatosis, unspecified: Secondary | ICD-10-CM | POA: Insufficient documentation

## 2011-07-30 DIAGNOSIS — I10 Essential (primary) hypertension: Secondary | ICD-10-CM | POA: Insufficient documentation

## 2011-07-30 DIAGNOSIS — Z951 Presence of aortocoronary bypass graft: Secondary | ICD-10-CM | POA: Insufficient documentation

## 2011-07-30 DIAGNOSIS — I498 Other specified cardiac arrhythmias: Secondary | ICD-10-CM | POA: Insufficient documentation

## 2011-07-30 DIAGNOSIS — I252 Old myocardial infarction: Secondary | ICD-10-CM | POA: Insufficient documentation

## 2011-07-30 DIAGNOSIS — Z5189 Encounter for other specified aftercare: Secondary | ICD-10-CM | POA: Insufficient documentation

## 2011-07-30 DIAGNOSIS — Z794 Long term (current) use of insulin: Secondary | ICD-10-CM | POA: Insufficient documentation

## 2011-07-30 DIAGNOSIS — F341 Dysthymic disorder: Secondary | ICD-10-CM | POA: Insufficient documentation

## 2011-07-30 DIAGNOSIS — I2510 Atherosclerotic heart disease of native coronary artery without angina pectoris: Secondary | ICD-10-CM | POA: Insufficient documentation

## 2011-07-30 DIAGNOSIS — G4733 Obstructive sleep apnea (adult) (pediatric): Secondary | ICD-10-CM | POA: Insufficient documentation

## 2011-07-30 DIAGNOSIS — E109 Type 1 diabetes mellitus without complications: Secondary | ICD-10-CM | POA: Insufficient documentation

## 2011-08-02 ENCOUNTER — Encounter (HOSPITAL_COMMUNITY)
Admission: RE | Admit: 2011-08-02 | Discharge: 2011-08-02 | Disposition: A | Discharge status: Home / Self Care | Payer: Self-pay | Source: Ambulatory Visit | Attending: Cardiovascular Disease | Admitting: Cardiovascular Disease

## 2011-08-04 ENCOUNTER — Encounter (HOSPITAL_COMMUNITY)
Admission: RE | Admit: 2011-08-04 | Discharge: 2011-08-04 | Disposition: A | Discharge status: Home / Self Care | Payer: Self-pay | Source: Ambulatory Visit | Attending: Cardiovascular Disease | Admitting: Cardiovascular Disease

## 2011-08-06 ENCOUNTER — Encounter (HOSPITAL_COMMUNITY)
Admission: RE | Admit: 2011-08-06 | Discharge: 2011-08-06 | Disposition: A | Discharge status: Home / Self Care | Payer: Self-pay | Source: Ambulatory Visit | Attending: Cardiovascular Disease | Admitting: Cardiovascular Disease

## 2011-08-09 ENCOUNTER — Encounter (HOSPITAL_COMMUNITY)
Admission: RE | Admit: 2011-08-09 | Discharge: 2011-08-09 | Disposition: A | Discharge status: Home / Self Care | Payer: Self-pay | Source: Ambulatory Visit | Attending: Cardiovascular Disease | Admitting: Cardiovascular Disease

## 2011-08-11 ENCOUNTER — Encounter (HOSPITAL_COMMUNITY)
Admission: RE | Admit: 2011-08-11 | Discharge: 2011-08-11 | Disposition: A | Discharge status: Home / Self Care | Payer: Self-pay | Source: Ambulatory Visit | Attending: Cardiovascular Disease | Admitting: Cardiovascular Disease

## 2011-08-13 ENCOUNTER — Encounter (HOSPITAL_COMMUNITY)
Admission: RE | Admit: 2011-08-13 | Discharge: 2011-08-13 | Disposition: A | Discharge status: Home / Self Care | Payer: Self-pay | Source: Ambulatory Visit | Attending: Cardiovascular Disease | Admitting: Cardiovascular Disease

## 2011-08-16 ENCOUNTER — Encounter (HOSPITAL_COMMUNITY)
Admission: RE | Admit: 2011-08-16 | Discharge: 2011-08-16 | Disposition: A | Discharge status: Home / Self Care | Payer: Self-pay | Source: Ambulatory Visit | Attending: Cardiovascular Disease | Admitting: Cardiovascular Disease

## 2011-08-18 ENCOUNTER — Encounter (HOSPITAL_COMMUNITY)
Admission: RE | Admit: 2011-08-18 | Discharge: 2011-08-18 | Disposition: A | Discharge status: Home / Self Care | Payer: Self-pay | Source: Ambulatory Visit | Attending: Cardiovascular Disease | Admitting: Cardiovascular Disease

## 2011-08-20 ENCOUNTER — Encounter (HOSPITAL_COMMUNITY): Payer: Self-pay

## 2011-08-23 ENCOUNTER — Encounter (HOSPITAL_COMMUNITY)
Admission: RE | Admit: 2011-08-23 | Discharge: 2011-08-23 | Disposition: A | Discharge status: Home / Self Care | Payer: Self-pay | Source: Ambulatory Visit | Attending: Cardiovascular Disease | Admitting: Cardiovascular Disease

## 2011-08-25 ENCOUNTER — Encounter (HOSPITAL_COMMUNITY)
Admission: RE | Admit: 2011-08-25 | Discharge: 2011-08-25 | Disposition: A | Discharge status: Home / Self Care | Payer: Self-pay | Source: Ambulatory Visit | Attending: Cardiovascular Disease | Admitting: Cardiovascular Disease

## 2011-08-27 ENCOUNTER — Encounter (HOSPITAL_COMMUNITY)
Admission: RE | Admit: 2011-08-27 | Discharge: 2011-08-27 | Disposition: A | Discharge status: Home / Self Care | Payer: Self-pay | Source: Ambulatory Visit | Attending: Cardiovascular Disease | Admitting: Cardiovascular Disease

## 2011-08-27 DIAGNOSIS — Z794 Long term (current) use of insulin: Secondary | ICD-10-CM | POA: Insufficient documentation

## 2011-08-27 DIAGNOSIS — Z79899 Other long term (current) drug therapy: Secondary | ICD-10-CM | POA: Insufficient documentation

## 2011-08-27 DIAGNOSIS — I319 Disease of pericardium, unspecified: Secondary | ICD-10-CM | POA: Insufficient documentation

## 2011-08-27 DIAGNOSIS — I498 Other specified cardiac arrhythmias: Secondary | ICD-10-CM | POA: Insufficient documentation

## 2011-08-27 DIAGNOSIS — Q8500 Neurofibromatosis, unspecified: Secondary | ICD-10-CM | POA: Insufficient documentation

## 2011-08-27 DIAGNOSIS — G4733 Obstructive sleep apnea (adult) (pediatric): Secondary | ICD-10-CM | POA: Insufficient documentation

## 2011-08-27 DIAGNOSIS — Z9641 Presence of insulin pump (external) (internal): Secondary | ICD-10-CM | POA: Insufficient documentation

## 2011-08-27 DIAGNOSIS — Z5189 Encounter for other specified aftercare: Secondary | ICD-10-CM | POA: Insufficient documentation

## 2011-08-27 DIAGNOSIS — E109 Type 1 diabetes mellitus without complications: Secondary | ICD-10-CM | POA: Insufficient documentation

## 2011-08-27 DIAGNOSIS — Z951 Presence of aortocoronary bypass graft: Secondary | ICD-10-CM | POA: Insufficient documentation

## 2011-08-27 DIAGNOSIS — E785 Hyperlipidemia, unspecified: Secondary | ICD-10-CM | POA: Insufficient documentation

## 2011-08-27 DIAGNOSIS — F341 Dysthymic disorder: Secondary | ICD-10-CM | POA: Insufficient documentation

## 2011-08-27 DIAGNOSIS — I10 Essential (primary) hypertension: Secondary | ICD-10-CM | POA: Insufficient documentation

## 2011-08-27 DIAGNOSIS — I2510 Atherosclerotic heart disease of native coronary artery without angina pectoris: Secondary | ICD-10-CM | POA: Insufficient documentation

## 2011-08-27 DIAGNOSIS — I252 Old myocardial infarction: Secondary | ICD-10-CM | POA: Insufficient documentation

## 2011-08-30 ENCOUNTER — Encounter (HOSPITAL_COMMUNITY)
Admission: RE | Admit: 2011-08-30 | Discharge: 2011-08-30 | Disposition: A | Discharge status: Home / Self Care | Payer: Self-pay | Source: Ambulatory Visit | Attending: Cardiovascular Disease | Admitting: Cardiovascular Disease

## 2011-09-01 ENCOUNTER — Encounter (HOSPITAL_COMMUNITY)
Admission: RE | Admit: 2011-09-01 | Discharge: 2011-09-01 | Disposition: A | Discharge status: Home / Self Care | Payer: Self-pay | Source: Ambulatory Visit | Attending: Cardiovascular Disease | Admitting: Cardiovascular Disease

## 2011-09-03 ENCOUNTER — Encounter (HOSPITAL_COMMUNITY)
Admission: RE | Admit: 2011-09-03 | Discharge: 2011-09-03 | Disposition: A | Discharge status: Home / Self Care | Payer: Self-pay | Source: Ambulatory Visit | Attending: Cardiovascular Disease | Admitting: Cardiovascular Disease

## 2011-09-06 ENCOUNTER — Encounter (HOSPITAL_COMMUNITY)
Admission: RE | Admit: 2011-09-06 | Discharge: 2011-09-06 | Disposition: A | Discharge status: Home / Self Care | Payer: Self-pay | Source: Ambulatory Visit | Attending: Cardiovascular Disease | Admitting: Cardiovascular Disease

## 2011-09-08 ENCOUNTER — Encounter (HOSPITAL_COMMUNITY)
Admission: RE | Admit: 2011-09-08 | Discharge: 2011-09-08 | Disposition: A | Discharge status: Home / Self Care | Payer: Self-pay | Source: Ambulatory Visit | Attending: Cardiovascular Disease | Admitting: Cardiovascular Disease

## 2011-09-10 ENCOUNTER — Encounter (HOSPITAL_COMMUNITY)
Admission: RE | Admit: 2011-09-10 | Discharge: 2011-09-10 | Disposition: A | Discharge status: Home / Self Care | Payer: Self-pay | Source: Ambulatory Visit | Attending: Cardiovascular Disease | Admitting: Cardiovascular Disease

## 2011-09-10 ENCOUNTER — Other Ambulatory Visit: Payer: Self-pay | Admitting: Cardiovascular Disease

## 2011-09-10 NOTE — Telephone Encounter (Signed)
Refilled effient ,needs appointment 

## 2011-09-13 ENCOUNTER — Encounter (HOSPITAL_COMMUNITY)
Admission: RE | Admit: 2011-09-13 | Discharge: 2011-09-13 | Disposition: A | Discharge status: Home / Self Care | Payer: Self-pay | Source: Ambulatory Visit | Attending: Cardiovascular Disease | Admitting: Cardiovascular Disease

## 2011-09-15 ENCOUNTER — Encounter (HOSPITAL_COMMUNITY): Payer: Self-pay

## 2011-09-17 ENCOUNTER — Encounter (HOSPITAL_COMMUNITY): Payer: Self-pay

## 2011-09-17 IMAGING — CR DG CHEST 1V PORT
1 series · 1 of 1 positions shown · non-contrast
Comparison: Chest x-ray of 10/15/2009

CLINICAL DATA: Status post CABG

PORTABLE CHEST - 1 VIEW

[AP]
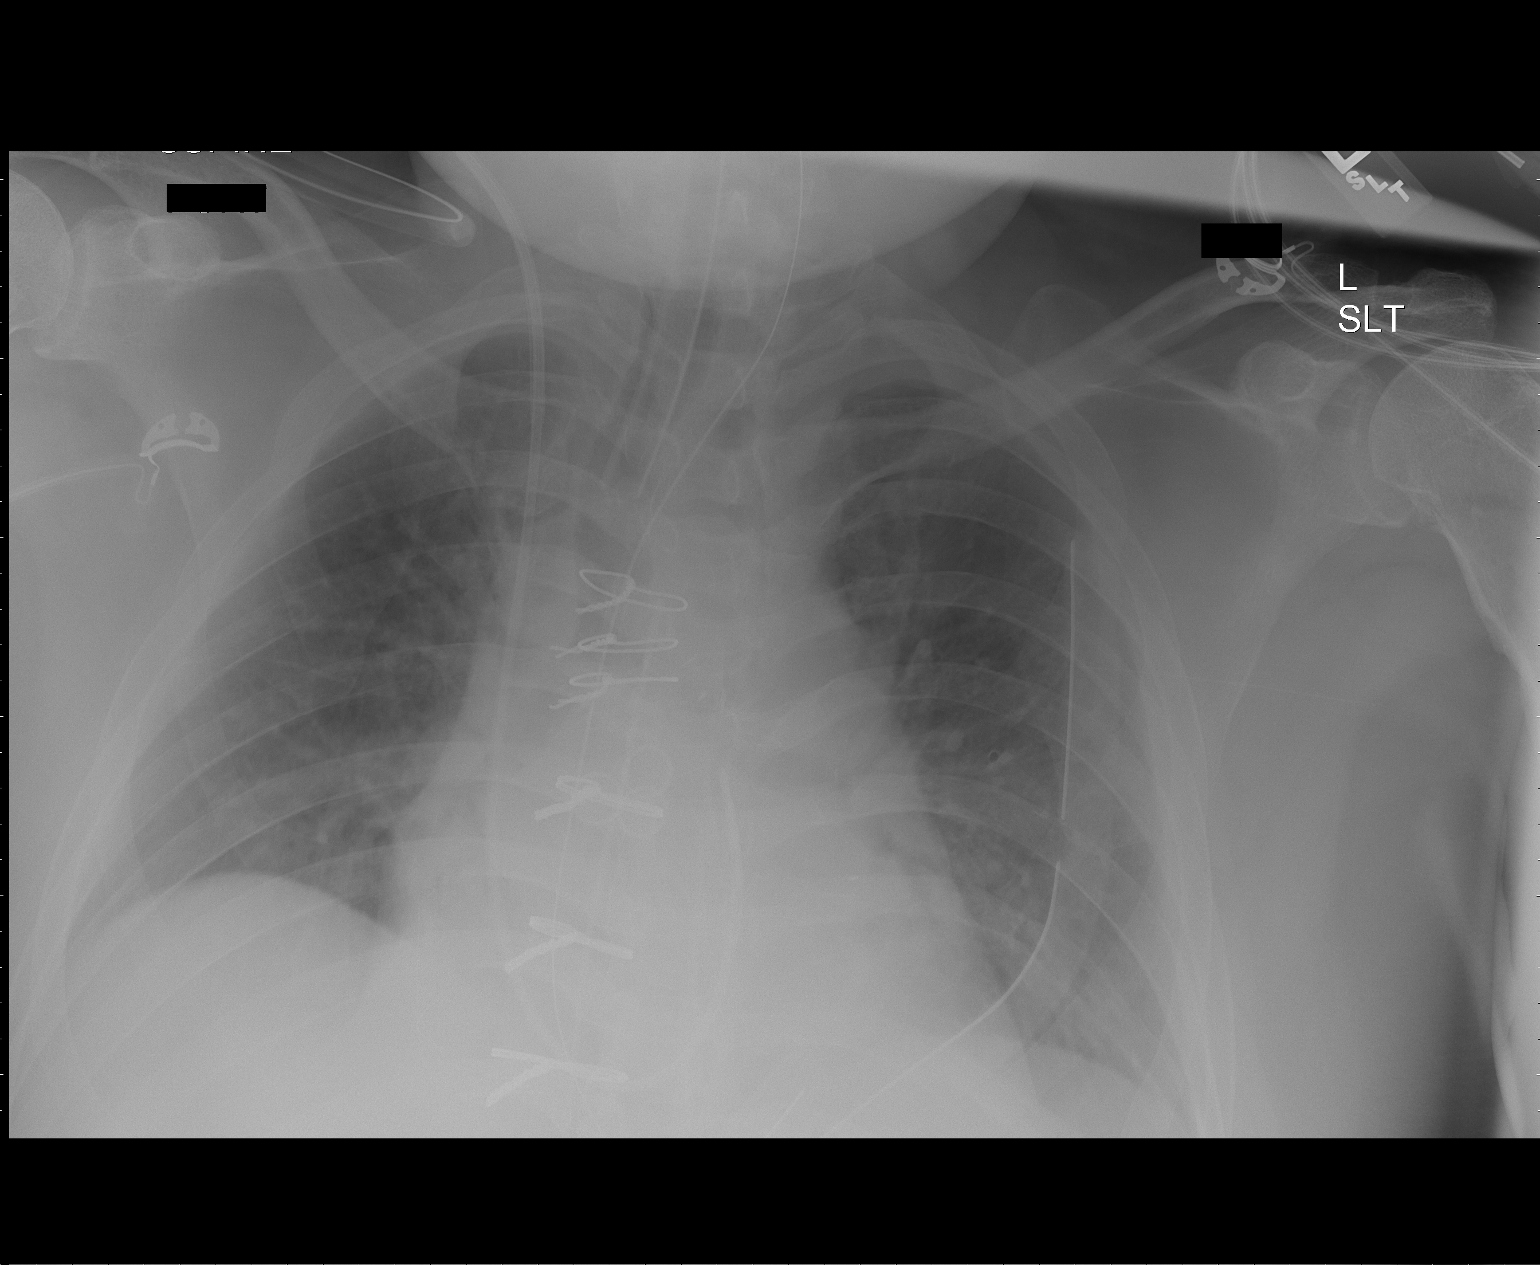

[1 of 1 positions shown; findings below may reference images not displayed]

FINDINGS: Endotracheal tube is present with the tip approximately
4.9 cm above the carina.  Saghafi catheters present with the tip
in the region of the main pulmonary artery. A left chest tube is
present and no pneumothorax is seen.  Mild volume loss is noted at
the lung bases.  Cardiomegaly is stable.
IMPRESSION: Status post mediastinotomy.  The endotracheal tube is 4.9 cm above
the carina.  Swan-Ganz catheter is in the main pulmonary artery and
a left chest tube is present.

## 2011-09-19 IMAGING — CR DG CHEST 1V PORT
1 series · 1 of 1 positions shown · non-contrast
Comparison: 10/17/2009

CLINICAL DATA: Status post CABG

PORTABLE CHEST - 1 VIEW

[AP]
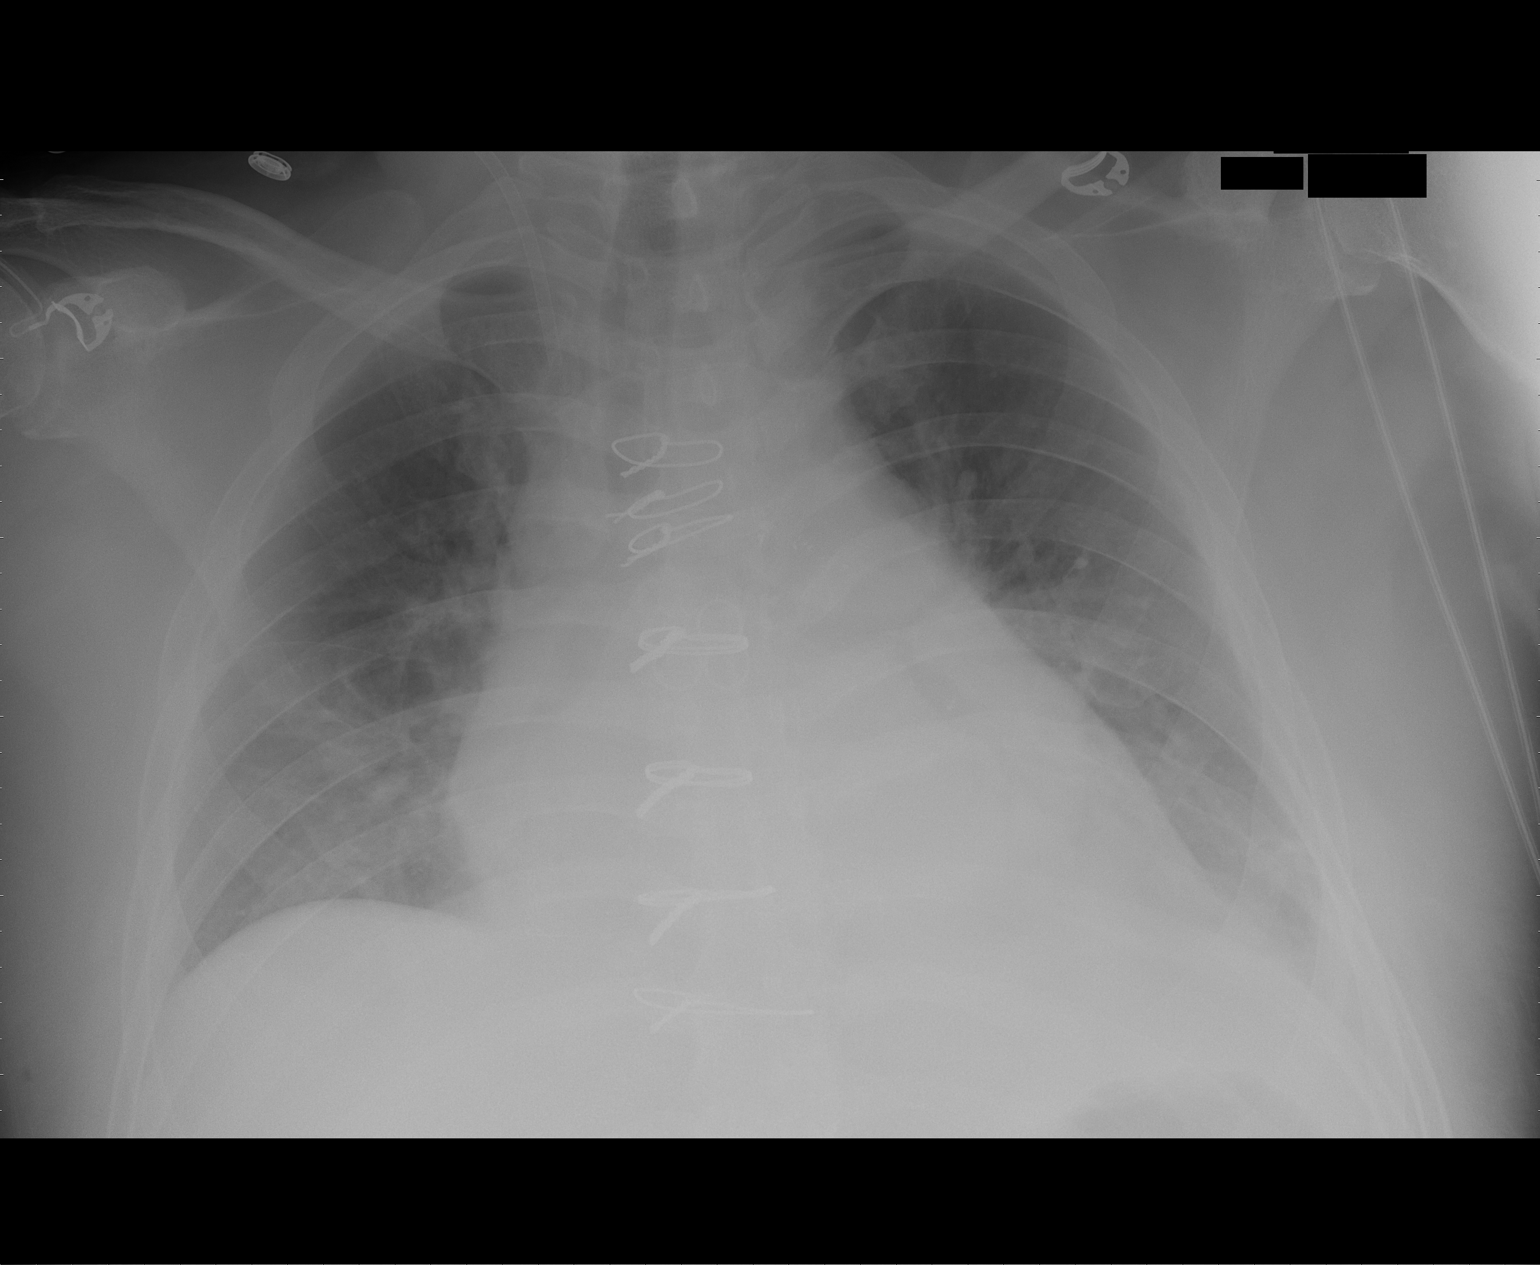

[1 of 1 positions shown; findings below may reference images not displayed]

FINDINGS: Swan-Ganz catheter has been removed.  Left chest tube
also removed with no evidence of pneumothorax.  Bilateral lower
lobe atelectasis is stable.  No edema.  Stable heart size and
mediastinal contours.
IMPRESSION: No pneumothorax after chest tube removal.  Stable bilateral lower
lobe atelectasis.

## 2011-09-20 ENCOUNTER — Encounter (HOSPITAL_COMMUNITY): Payer: Self-pay

## 2011-09-21 ENCOUNTER — Encounter (HOSPITAL_COMMUNITY)
Admission: RE | Admit: 2011-09-21 | Discharge: 2011-09-21 | Disposition: A | Discharge status: Home / Self Care | Payer: Self-pay | Source: Ambulatory Visit | Attending: Cardiovascular Disease | Admitting: Cardiovascular Disease

## 2011-09-22 ENCOUNTER — Encounter (HOSPITAL_COMMUNITY): Payer: Self-pay

## 2011-09-24 ENCOUNTER — Encounter (HOSPITAL_COMMUNITY)
Admission: RE | Admit: 2011-09-24 | Discharge: 2011-09-24 | Disposition: A | Discharge status: Home / Self Care | Payer: Self-pay | Source: Ambulatory Visit | Attending: Cardiovascular Disease | Admitting: Cardiovascular Disease

## 2011-09-27 ENCOUNTER — Encounter: Payer: Self-pay | Admitting: Cardiovascular Disease

## 2011-09-27 ENCOUNTER — Encounter (HOSPITAL_COMMUNITY)
Admission: RE | Admit: 2011-09-27 | Discharge: 2011-09-27 | Disposition: A | Discharge status: Home / Self Care | Payer: Self-pay | Source: Ambulatory Visit | Attending: Cardiovascular Disease | Admitting: Cardiovascular Disease

## 2011-09-27 DIAGNOSIS — Z79899 Other long term (current) drug therapy: Secondary | ICD-10-CM | POA: Insufficient documentation

## 2011-09-27 DIAGNOSIS — Z951 Presence of aortocoronary bypass graft: Secondary | ICD-10-CM | POA: Insufficient documentation

## 2011-09-27 DIAGNOSIS — Z794 Long term (current) use of insulin: Secondary | ICD-10-CM | POA: Insufficient documentation

## 2011-09-27 DIAGNOSIS — Z9641 Presence of insulin pump (external) (internal): Secondary | ICD-10-CM | POA: Insufficient documentation

## 2011-09-27 DIAGNOSIS — I2510 Atherosclerotic heart disease of native coronary artery without angina pectoris: Secondary | ICD-10-CM | POA: Insufficient documentation

## 2011-09-27 DIAGNOSIS — I498 Other specified cardiac arrhythmias: Secondary | ICD-10-CM | POA: Insufficient documentation

## 2011-09-27 DIAGNOSIS — F341 Dysthymic disorder: Secondary | ICD-10-CM | POA: Insufficient documentation

## 2011-09-27 DIAGNOSIS — E109 Type 1 diabetes mellitus without complications: Secondary | ICD-10-CM | POA: Insufficient documentation

## 2011-09-27 DIAGNOSIS — Z5189 Encounter for other specified aftercare: Secondary | ICD-10-CM | POA: Insufficient documentation

## 2011-09-27 DIAGNOSIS — G4733 Obstructive sleep apnea (adult) (pediatric): Secondary | ICD-10-CM | POA: Insufficient documentation

## 2011-09-27 DIAGNOSIS — Q8500 Neurofibromatosis, unspecified: Secondary | ICD-10-CM | POA: Insufficient documentation

## 2011-09-27 DIAGNOSIS — I10 Essential (primary) hypertension: Secondary | ICD-10-CM | POA: Insufficient documentation

## 2011-09-27 DIAGNOSIS — I252 Old myocardial infarction: Secondary | ICD-10-CM | POA: Insufficient documentation

## 2011-09-27 DIAGNOSIS — E785 Hyperlipidemia, unspecified: Secondary | ICD-10-CM | POA: Insufficient documentation

## 2011-09-27 DIAGNOSIS — I319 Disease of pericardium, unspecified: Secondary | ICD-10-CM | POA: Insufficient documentation

## 2011-09-29 ENCOUNTER — Encounter (HOSPITAL_COMMUNITY)
Admission: RE | Admit: 2011-09-29 | Discharge: 2011-09-29 | Disposition: A | Discharge status: Home / Self Care | Payer: Self-pay | Source: Ambulatory Visit | Attending: Cardiovascular Disease | Admitting: Cardiovascular Disease

## 2011-10-01 ENCOUNTER — Encounter (HOSPITAL_COMMUNITY)
Admission: RE | Admit: 2011-10-01 | Discharge: 2011-10-01 | Disposition: A | Discharge status: Home / Self Care | Payer: Self-pay | Source: Ambulatory Visit | Attending: Cardiovascular Disease | Admitting: Cardiovascular Disease

## 2011-10-04 ENCOUNTER — Encounter (HOSPITAL_COMMUNITY): Payer: Self-pay

## 2011-10-05 ENCOUNTER — Encounter (HOSPITAL_COMMUNITY)
Admission: RE | Admit: 2011-10-05 | Discharge: 2011-10-05 | Disposition: A | Discharge status: Home / Self Care | Payer: Self-pay | Source: Ambulatory Visit | Attending: Cardiovascular Disease | Admitting: Cardiovascular Disease

## 2011-10-06 ENCOUNTER — Encounter (HOSPITAL_COMMUNITY)
Admission: RE | Admit: 2011-10-06 | Discharge: 2011-10-06 | Disposition: A | Discharge status: Home / Self Care | Payer: Self-pay | Source: Ambulatory Visit | Attending: Cardiovascular Disease | Admitting: Cardiovascular Disease

## 2011-10-08 ENCOUNTER — Encounter (HOSPITAL_COMMUNITY): Payer: Self-pay

## 2011-10-11 ENCOUNTER — Encounter (HOSPITAL_COMMUNITY)
Admission: RE | Admit: 2011-10-11 | Discharge: 2011-10-11 | Disposition: A | Discharge status: Home / Self Care | Payer: Self-pay | Source: Ambulatory Visit | Attending: Cardiovascular Disease | Admitting: Cardiovascular Disease

## 2011-10-13 ENCOUNTER — Encounter (HOSPITAL_COMMUNITY)
Admission: RE | Admit: 2011-10-13 | Discharge: 2011-10-13 | Disposition: A | Discharge status: Home / Self Care | Payer: Self-pay | Source: Ambulatory Visit | Attending: Cardiovascular Disease | Admitting: Cardiovascular Disease

## 2011-10-15 ENCOUNTER — Encounter (HOSPITAL_COMMUNITY)
Admission: RE | Admit: 2011-10-15 | Discharge: 2011-10-15 | Disposition: A | Discharge status: Home / Self Care | Payer: Self-pay | Source: Ambulatory Visit | Attending: Cardiovascular Disease | Admitting: Cardiovascular Disease

## 2011-10-18 ENCOUNTER — Encounter (HOSPITAL_COMMUNITY)
Admission: RE | Admit: 2011-10-18 | Discharge: 2011-10-18 | Disposition: A | Discharge status: Home / Self Care | Payer: Self-pay | Source: Ambulatory Visit | Attending: Cardiovascular Disease | Admitting: Cardiovascular Disease

## 2011-10-20 ENCOUNTER — Encounter (HOSPITAL_COMMUNITY)
Admission: RE | Admit: 2011-10-20 | Discharge: 2011-10-20 | Disposition: A | Discharge status: Home / Self Care | Payer: Self-pay | Source: Ambulatory Visit | Attending: Cardiovascular Disease | Admitting: Cardiovascular Disease

## 2011-10-22 ENCOUNTER — Encounter (HOSPITAL_COMMUNITY)
Admission: RE | Admit: 2011-10-22 | Discharge: 2011-10-22 | Disposition: A | Discharge status: Home / Self Care | Payer: Self-pay | Source: Ambulatory Visit | Attending: Cardiovascular Disease | Admitting: Cardiovascular Disease

## 2011-10-25 ENCOUNTER — Encounter (HOSPITAL_COMMUNITY)
Admission: RE | Admit: 2011-10-25 | Discharge: 2011-10-25 | Disposition: A | Discharge status: Home / Self Care | Payer: Self-pay | Source: Ambulatory Visit | Attending: Cardiovascular Disease | Admitting: Cardiovascular Disease

## 2011-10-26 ENCOUNTER — Other Ambulatory Visit: Payer: Self-pay | Admitting: Cardiovascular Disease

## 2011-10-27 ENCOUNTER — Encounter (HOSPITAL_COMMUNITY): Payer: Self-pay

## 2011-10-27 DIAGNOSIS — Z79899 Other long term (current) drug therapy: Secondary | ICD-10-CM | POA: Insufficient documentation

## 2011-10-27 DIAGNOSIS — Z9641 Presence of insulin pump (external) (internal): Secondary | ICD-10-CM | POA: Insufficient documentation

## 2011-10-27 DIAGNOSIS — E109 Type 1 diabetes mellitus without complications: Secondary | ICD-10-CM | POA: Insufficient documentation

## 2011-10-27 DIAGNOSIS — Z794 Long term (current) use of insulin: Secondary | ICD-10-CM | POA: Insufficient documentation

## 2011-10-27 DIAGNOSIS — Z5189 Encounter for other specified aftercare: Secondary | ICD-10-CM | POA: Insufficient documentation

## 2011-10-27 DIAGNOSIS — Q8500 Neurofibromatosis, unspecified: Secondary | ICD-10-CM | POA: Insufficient documentation

## 2011-10-27 DIAGNOSIS — E785 Hyperlipidemia, unspecified: Secondary | ICD-10-CM | POA: Insufficient documentation

## 2011-10-27 DIAGNOSIS — Z951 Presence of aortocoronary bypass graft: Secondary | ICD-10-CM | POA: Insufficient documentation

## 2011-10-27 DIAGNOSIS — G4733 Obstructive sleep apnea (adult) (pediatric): Secondary | ICD-10-CM | POA: Insufficient documentation

## 2011-10-27 DIAGNOSIS — F341 Dysthymic disorder: Secondary | ICD-10-CM | POA: Insufficient documentation

## 2011-10-27 DIAGNOSIS — I2510 Atherosclerotic heart disease of native coronary artery without angina pectoris: Secondary | ICD-10-CM | POA: Insufficient documentation

## 2011-10-27 DIAGNOSIS — I319 Disease of pericardium, unspecified: Secondary | ICD-10-CM | POA: Insufficient documentation

## 2011-10-27 DIAGNOSIS — I10 Essential (primary) hypertension: Secondary | ICD-10-CM | POA: Insufficient documentation

## 2011-10-27 DIAGNOSIS — I252 Old myocardial infarction: Secondary | ICD-10-CM | POA: Insufficient documentation

## 2011-10-27 DIAGNOSIS — I498 Other specified cardiac arrhythmias: Secondary | ICD-10-CM | POA: Insufficient documentation

## 2011-10-29 ENCOUNTER — Encounter (HOSPITAL_COMMUNITY): Payer: Self-pay

## 2011-11-01 ENCOUNTER — Encounter (HOSPITAL_COMMUNITY): Payer: Self-pay

## 2011-11-03 ENCOUNTER — Encounter (HOSPITAL_COMMUNITY)
Admission: RE | Admit: 2011-11-03 | Discharge: 2011-11-03 | Disposition: A | Discharge status: Home / Self Care | Payer: Self-pay | Source: Ambulatory Visit | Attending: Cardiovascular Disease | Admitting: Cardiovascular Disease

## 2011-11-05 ENCOUNTER — Encounter (HOSPITAL_COMMUNITY)
Admission: RE | Admit: 2011-11-05 | Discharge: 2011-11-05 | Disposition: A | Discharge status: Home / Self Care | Payer: Self-pay | Source: Ambulatory Visit | Attending: Cardiovascular Disease | Admitting: Cardiovascular Disease

## 2011-11-08 ENCOUNTER — Encounter (HOSPITAL_COMMUNITY)
Admission: RE | Admit: 2011-11-08 | Discharge: 2011-11-08 | Disposition: A | Discharge status: Home / Self Care | Payer: Self-pay | Source: Ambulatory Visit | Attending: Cardiovascular Disease | Admitting: Cardiovascular Disease

## 2011-11-10 ENCOUNTER — Encounter (HOSPITAL_COMMUNITY): Payer: Self-pay

## 2011-11-12 ENCOUNTER — Encounter (HOSPITAL_COMMUNITY)
Admission: RE | Admit: 2011-11-12 | Discharge: 2011-11-12 | Disposition: A | Discharge status: Home / Self Care | Payer: Self-pay | Source: Ambulatory Visit | Attending: Cardiovascular Disease | Admitting: Cardiovascular Disease

## 2011-11-15 ENCOUNTER — Encounter (HOSPITAL_COMMUNITY)
Admission: RE | Admit: 2011-11-15 | Discharge: 2011-11-15 | Disposition: A | Discharge status: Home / Self Care | Payer: Self-pay | Source: Ambulatory Visit | Attending: Cardiovascular Disease | Admitting: Cardiovascular Disease

## 2011-11-17 ENCOUNTER — Encounter (HOSPITAL_COMMUNITY)
Admission: RE | Admit: 2011-11-17 | Discharge: 2011-11-17 | Disposition: A | Discharge status: Home / Self Care | Payer: Self-pay | Source: Ambulatory Visit | Attending: Cardiovascular Disease | Admitting: Cardiovascular Disease

## 2011-11-19 ENCOUNTER — Encounter (HOSPITAL_COMMUNITY)
Admission: RE | Admit: 2011-11-19 | Discharge: 2011-11-19 | Disposition: A | Discharge status: Home / Self Care | Payer: Self-pay | Source: Ambulatory Visit | Attending: Cardiovascular Disease | Admitting: Cardiovascular Disease

## 2011-11-19 NOTE — Progress Notes (Signed)
Nutrition Note Spoke with pt. Pt c/o "low testosterone" level and wanted to know if any foods increase or decrease testosterone levels. This writer could not find significant data that would recommend adding or removing foods to help with testosterone level. Pt expressed understanding. Continue client-centered nutrition education by RD as part of interdisciplinary care.  Monitor and evaluate progress toward nutrition goal with team.

## 2011-11-22 ENCOUNTER — Encounter (HOSPITAL_COMMUNITY): Payer: Self-pay

## 2011-11-24 ENCOUNTER — Inpatient Hospital Stay (HOSPITAL_COMMUNITY)
Admission: EM | Admit: 2011-11-24 | Discharge: 2011-11-27 | DRG: 342 | Disposition: A | Discharge status: Home / Self Care | Payer: Medicare Other | Attending: General Surgery | Admitting: General Surgery

## 2011-11-24 ENCOUNTER — Emergency Department (HOSPITAL_COMMUNITY): Payer: Medicare Other | Admitting: Anesthesiology

## 2011-11-24 ENCOUNTER — Emergency Department (HOSPITAL_COMMUNITY): Payer: Medicare Other

## 2011-11-24 ENCOUNTER — Encounter (HOSPITAL_COMMUNITY): Payer: Self-pay | Admitting: Anesthesiology

## 2011-11-24 ENCOUNTER — Encounter (HOSPITAL_COMMUNITY): Payer: Self-pay | Admitting: *Deleted

## 2011-11-24 ENCOUNTER — Encounter (HOSPITAL_COMMUNITY): Payer: Self-pay

## 2011-11-24 ENCOUNTER — Encounter (HOSPITAL_COMMUNITY): Payer: Self-pay | Admitting: Emergency Medicine

## 2011-11-24 ENCOUNTER — Encounter (HOSPITAL_COMMUNITY): Admission: EM | Disposition: A | Discharge status: Home / Self Care | Payer: Self-pay | Source: Home / Self Care

## 2011-11-24 DIAGNOSIS — Z7902 Long term (current) use of antithrombotics/antiplatelets: Secondary | ICD-10-CM

## 2011-11-24 DIAGNOSIS — I25810 Atherosclerosis of coronary artery bypass graft(s) without angina pectoris: Secondary | ICD-10-CM | POA: Diagnosis present

## 2011-11-24 DIAGNOSIS — Z833 Family history of diabetes mellitus: Secondary | ICD-10-CM

## 2011-11-24 DIAGNOSIS — K37 Unspecified appendicitis: Secondary | ICD-10-CM

## 2011-11-24 DIAGNOSIS — K5900 Constipation, unspecified: Secondary | ICD-10-CM | POA: Diagnosis present

## 2011-11-24 DIAGNOSIS — Z91040 Latex allergy status: Secondary | ICD-10-CM

## 2011-11-24 DIAGNOSIS — I252 Old myocardial infarction: Secondary | ICD-10-CM

## 2011-11-24 DIAGNOSIS — Z7982 Long term (current) use of aspirin: Secondary | ICD-10-CM

## 2011-11-24 DIAGNOSIS — E785 Hyperlipidemia, unspecified: Secondary | ICD-10-CM | POA: Diagnosis present

## 2011-11-24 DIAGNOSIS — E669 Obesity, unspecified: Secondary | ICD-10-CM | POA: Diagnosis present

## 2011-11-24 DIAGNOSIS — E11319 Type 2 diabetes mellitus with unspecified diabetic retinopathy without macular edema: Secondary | ICD-10-CM | POA: Diagnosis present

## 2011-11-24 DIAGNOSIS — Z79899 Other long term (current) drug therapy: Secondary | ICD-10-CM

## 2011-11-24 DIAGNOSIS — Z794 Long term (current) use of insulin: Secondary | ICD-10-CM

## 2011-11-24 DIAGNOSIS — F3289 Other specified depressive episodes: Secondary | ICD-10-CM | POA: Diagnosis present

## 2011-11-24 DIAGNOSIS — E1039 Type 1 diabetes mellitus with other diabetic ophthalmic complication: Secondary | ICD-10-CM | POA: Diagnosis present

## 2011-11-24 DIAGNOSIS — Z9641 Presence of insulin pump (external) (internal): Secondary | ICD-10-CM

## 2011-11-24 DIAGNOSIS — G40909 Epilepsy, unspecified, not intractable, without status epilepticus: Secondary | ICD-10-CM | POA: Diagnosis present

## 2011-11-24 DIAGNOSIS — K3580 Unspecified acute appendicitis: Principal | ICD-10-CM | POA: Diagnosis present

## 2011-11-24 DIAGNOSIS — I10 Essential (primary) hypertension: Secondary | ICD-10-CM | POA: Diagnosis present

## 2011-11-24 DIAGNOSIS — F329 Major depressive disorder, single episode, unspecified: Secondary | ICD-10-CM | POA: Diagnosis present

## 2011-11-24 DIAGNOSIS — Z8249 Family history of ischemic heart disease and other diseases of the circulatory system: Secondary | ICD-10-CM

## 2011-11-24 HISTORY — PX: LAPAROSCOPIC APPENDECTOMY: SHX408

## 2011-11-24 LAB — URINALYSIS, ROUTINE W REFLEX MICROSCOPIC
Ketones, ur: 40 mg/dL — AB
Nitrite: NEGATIVE
Protein, ur: NEGATIVE mg/dL

## 2011-11-24 LAB — BASIC METABOLIC PANEL
Calcium: 9.5 mg/dL (ref 8.4–10.5)
Creatinine, Ser: 0.74 mg/dL (ref 0.50–1.35)
GFR calc Af Amer: >90 mL/min (ref >90)
GFR calc non Af Amer: >90 mL/min (ref >90)

## 2011-11-24 LAB — GLUCOSE, CAPILLARY: Glucose-Capillary: 239 mg/dL — ABNORMAL HIGH (ref 70–99)

## 2011-11-24 LAB — DIFFERENTIAL
Basophils Absolute: 0 10*3/uL (ref 0.0–0.1)
Basophils Relative: 0 % (ref 0–1)
Eosinophils Absolute: 0 10*3/uL (ref 0.0–0.7)
Eosinophils Relative: 0 % (ref 0–5)
Monocytes Absolute: 1.1 10*3/uL — ABNORMAL HIGH (ref 0.1–1.0)
Neutro Abs: 9.1 10*3/uL — ABNORMAL HIGH (ref 1.7–7.7)

## 2011-11-24 LAB — CBC
HCT: 40.1 % (ref 39.0–52.0)
MCH: 31.1 pg (ref 26.0–34.0)
MCHC: 35.4 g/dL (ref 30.0–36.0)
MCV: 87.7 fL (ref 78.0–100.0)
RDW: 12.9 % (ref 11.5–15.5)

## 2011-11-24 SURGERY — APPENDECTOMY, LAPAROSCOPIC
Anesthesia: General | Site: Abdomen | Wound class: Clean Contaminated

## 2011-11-24 MED ORDER — NITROGLYCERIN 0.4 MG SL SUBL: 0.4000 mg | SUBLINGUAL_TABLET | SUBLINGUAL | Status: DC | PRN

## 2011-11-24 MED ORDER — SODIUM CHLORIDE 0.9 % IV BOLUS (SEPSIS)
1000.0000 mL | Freq: Once | INTRAVENOUS | Status: AC
  Administered 2011-11-24: 1000 mL via INTRAVENOUS

## 2011-11-24 MED ORDER — BUPIVACAINE-EPINEPHRINE 0.25% -1:200000 IJ SOLN
INTRAMUSCULAR | Status: DC | PRN
  Administered 2011-11-24: 18 mL

## 2011-11-24 MED ORDER — SERTRALINE HCL 50 MG PO TABS
150.0000 mg | ORAL_TABLET | Freq: Every day | ORAL | Status: DC
  Administered 2011-11-25 – 2011-11-26 (×2): 150 mg via ORAL
  Filled 2011-11-24 (×3): qty 1

## 2011-11-24 MED ORDER — BENZONATATE 100 MG PO CAPS
100.0000 mg | ORAL_CAPSULE | Freq: Three times a day (TID) | ORAL | Status: DC | PRN
  Filled 2011-11-24: qty 1

## 2011-11-24 MED ORDER — PIPERACILLIN-TAZOBACTAM 3.375 G IVPB
3.3750 g | Freq: Once | INTRAVENOUS | Status: AC
  Administered 2011-11-24: 3.375 g via INTRAVENOUS
  Filled 2011-11-24: qty 50

## 2011-11-24 MED ORDER — LEVETIRACETAM 250 MG PO TABS
250.0000 mg | ORAL_TABLET | Freq: Two times a day (BID) | ORAL | Status: DC
  Administered 2011-11-25: 500 mg via ORAL

## 2011-11-24 MED ORDER — ONDANSETRON HCL 4 MG/2ML IJ SOLN: 4.0000 mg | Freq: Four times a day (QID) | INTRAMUSCULAR | Status: DC | PRN

## 2011-11-24 MED ORDER — ONDANSETRON HCL 4 MG/2ML IJ SOLN
INTRAMUSCULAR | Status: DC | PRN
  Administered 2011-11-24: 4 mg via INTRAVENOUS

## 2011-11-24 MED ORDER — FENTANYL CITRATE 0.05 MG/ML IJ SOLN
INTRAMUSCULAR | Status: DC | PRN
  Administered 2011-11-24: 50 ug via INTRAVENOUS
  Administered 2011-11-24 (×2): 100 ug via INTRAVENOUS
  Administered 2011-11-24: 50 ug via INTRAVENOUS

## 2011-11-24 MED ORDER — LEVETIRACETAM 250 MG PO TABS
250.0000 mg | ORAL_TABLET | Freq: Every day | ORAL | Status: DC
  Administered 2011-11-25 – 2011-11-27 (×3): 250 mg via ORAL
  Filled 2011-11-24 (×4): qty 1

## 2011-11-24 MED ORDER — DOCUSATE SODIUM 100 MG PO CAPS
100.0000 mg | ORAL_CAPSULE | Freq: Every day | ORAL | Status: DC
Start: 2011-11-25 — End: 2011-11-27
  Administered 2011-11-25 – 2011-11-27 (×3): 100 mg via ORAL
  Filled 2011-11-24 (×2): qty 1

## 2011-11-24 MED ORDER — LISINOPRIL 5 MG PO TABS
5.0000 mg | ORAL_TABLET | Freq: Every day | ORAL | Status: DC
  Administered 2011-11-25 – 2011-11-27 (×3): 5 mg via ORAL
  Filled 2011-11-24 (×3): qty 1

## 2011-11-24 MED ORDER — EZETIMIBE 10 MG PO TABS
10.0000 mg | ORAL_TABLET | Freq: Every day | ORAL | Status: DC
  Administered 2011-11-25 – 2011-11-26 (×2): 10 mg via ORAL
  Filled 2011-11-24 (×3): qty 1

## 2011-11-24 MED ORDER — ONDANSETRON HCL 4 MG PO TABS: 4.0000 mg | ORAL_TABLET | Freq: Four times a day (QID) | ORAL | Status: DC | PRN

## 2011-11-24 MED ORDER — ONE-A-DAY MENS PO TABS
1.0000 | ORAL_TABLET | Freq: Every day | ORAL | Status: DC
  Administered 2011-11-25 – 2011-11-27 (×3): 1 via ORAL
  Filled 2011-11-24 (×3): qty 1

## 2011-11-24 MED ORDER — GUAIFENESIN ER 600 MG PO TB12
600.0000 mg | ORAL_TABLET | Freq: Two times a day (BID) | ORAL | Status: DC
  Administered 2011-11-26: 1200 mg via ORAL
  Filled 2011-11-24 (×7): qty 2

## 2011-11-24 MED ORDER — CARBAMAZEPINE ER 400 MG PO TB12
600.0000 mg | ORAL_TABLET | Freq: Two times a day (BID) | ORAL | Status: DC
  Administered 2011-11-25 – 2011-11-27 (×6): 600 mg via ORAL
  Filled 2011-11-24 (×8): qty 1

## 2011-11-24 MED ORDER — LACTATED RINGERS IV SOLN
INTRAVENOUS | Status: DC | PRN
  Administered 2011-11-24: 19:00:00 via INTRAVENOUS

## 2011-11-24 MED ORDER — HYDROCODONE-ACETAMINOPHEN 5-325 MG PO TABS
1.0000 | ORAL_TABLET | ORAL | Status: DC | PRN
  Administered 2011-11-24 – 2011-11-26 (×7): 2 via ORAL
  Filled 2011-11-24 (×7): qty 2

## 2011-11-24 MED ORDER — ATORVASTATIN CALCIUM 40 MG PO TABS
40.0000 mg | ORAL_TABLET | Freq: Every day | ORAL | Status: DC
  Administered 2011-11-25 – 2011-11-26 (×2): 40 mg via ORAL
  Filled 2011-11-24 (×3): qty 1

## 2011-11-24 MED ORDER — POTASSIUM CHLORIDE IN NACL 20-0.9 MEQ/L-% IV SOLN
INTRAVENOUS | Status: DC
  Administered 2011-11-24 – 2011-11-26 (×3): via INTRAVENOUS
  Filled 2011-11-24 (×5): qty 1000

## 2011-11-24 MED ORDER — ONDANSETRON HCL 4 MG/2ML IJ SOLN
4.0000 mg | Freq: Once | INTRAMUSCULAR | Status: AC
  Administered 2011-11-24: 4 mg via INTRAVENOUS
  Filled 2011-11-24: qty 2

## 2011-11-24 MED ORDER — VECURONIUM BROMIDE 10 MG IV SOLR
INTRAVENOUS | Status: DC | PRN
  Administered 2011-11-24: 5 mg via INTRAVENOUS
  Administered 2011-11-24: 2 mg via INTRAVENOUS

## 2011-11-24 MED ORDER — METOPROLOL TARTRATE 25 MG PO TABS
25.0000 mg | ORAL_TABLET | Freq: Two times a day (BID) | ORAL | Status: DC
  Administered 2011-11-25 – 2011-11-27 (×6): 25 mg via ORAL
  Filled 2011-11-24 (×8): qty 1

## 2011-11-24 MED ORDER — HYDROMORPHONE HCL PF 1 MG/ML IJ SOLN
0.2500 mg | INTRAMUSCULAR | Status: DC | PRN
  Administered 2011-11-24 (×2): 0.5 mg via INTRAVENOUS

## 2011-11-24 MED ORDER — GLYCOPYRROLATE 0.2 MG/ML IJ SOLN
INTRAMUSCULAR | Status: DC | PRN
  Administered 2011-11-24: .8 mg via INTRAVENOUS

## 2011-11-24 MED ORDER — LIDOCAINE HCL (CARDIAC) 20 MG/ML IV SOLN
INTRAVENOUS | Status: DC | PRN
  Administered 2011-11-24: 50 mg via INTRAVENOUS

## 2011-11-24 MED ORDER — SUCCINYLCHOLINE CHLORIDE 20 MG/ML IJ SOLN
INTRAMUSCULAR | Status: DC | PRN
  Administered 2011-11-24: 50 mg via INTRAVENOUS

## 2011-11-24 MED ORDER — HYDROMORPHONE HCL PF 1 MG/ML IJ SOLN: 1.0000 mg | INTRAMUSCULAR | Status: DC | PRN

## 2011-11-24 MED ORDER — ESMOLOL HCL 10 MG/ML IV SOLN
INTRAVENOUS | Status: DC | PRN
  Administered 2011-11-24: 15 mg via INTRAVENOUS

## 2011-11-24 MED ORDER — PIPERACILLIN-TAZOBACTAM 3.375 G IVPB 30 MIN
3.3750 g | Freq: Three times a day (TID) | INTRAVENOUS | Status: DC
  Filled 2011-11-24: qty 50

## 2011-11-24 MED ORDER — LEVETIRACETAM 500 MG PO TABS
500.0000 mg | ORAL_TABLET | Freq: Every day | ORAL | Status: DC
  Administered 2011-11-25 – 2011-11-26 (×2): 500 mg via ORAL
  Filled 2011-11-24 (×4): qty 1

## 2011-11-24 MED ORDER — INSULIN ASPART 100 UNIT/ML ~~LOC~~ SOLN
0.0000 [IU] | Freq: Three times a day (TID) | SUBCUTANEOUS | Status: DC
Start: 2011-11-25 — End: 2011-11-27

## 2011-11-24 MED ORDER — SODIUM CHLORIDE 0.9 % IR SOLN
Status: DC | PRN
  Administered 2011-11-24: 4000 mL

## 2011-11-24 MED ORDER — PROPOFOL 10 MG/ML IV EMUL
INTRAVENOUS | Status: DC | PRN
  Administered 2011-11-24: 300 mg via INTRAVENOUS
  Administered 2011-11-24: 50 mg via INTRAVENOUS

## 2011-11-24 MED ORDER — IOHEXOL 300 MG/ML  SOLN
100.0000 mL | Freq: Once | INTRAMUSCULAR | Status: AC | PRN
  Administered 2011-11-24: 100 mL via INTRAVENOUS

## 2011-11-24 MED ORDER — NEOSTIGMINE METHYLSULFATE 1 MG/ML IJ SOLN
INTRAMUSCULAR | Status: DC | PRN
  Administered 2011-11-24: 5 mg via INTRAVENOUS

## 2011-11-24 MED ORDER — ONDANSETRON HCL 4 MG/2ML IJ SOLN: 4.0000 mg | Freq: Once | INTRAMUSCULAR | Status: DC | PRN

## 2011-11-24 SURGICAL SUPPLY — 44 items
ADH SKN CLS APL DERMABOND .7 (GAUZE/BANDAGES/DRESSINGS) ×1
APPLIER CLIP ROT 10 11.4 M/L (STAPLE)
APR CLP MED LRG 11.4X10 (STAPLE)
BAG SPEC RTRVL LRG 6X4 10 (ENDOMECHANICALS) ×1
BLADE SURG ROTATE 9660 (MISCELLANEOUS) IMPLANT
CANISTER SUCTION 2500CC (MISCELLANEOUS) ×3 IMPLANT
CHLORAPREP W/TINT 26ML (MISCELLANEOUS) ×2 IMPLANT
CLIP APPLIE ROT 10 11.4 M/L (STAPLE) IMPLANT
CLOTH BEACON ORANGE TIMEOUT ST (SAFETY) ×2 IMPLANT
COVER SURGICAL LIGHT HANDLE (MISCELLANEOUS) ×2 IMPLANT
CUTTER LINEAR ENDO 35 ETS (STAPLE) ×1 IMPLANT
CUTTER LINEAR ENDO 35 ETS TH (STAPLE) IMPLANT
DECANTER SPIKE VIAL GLASS SM (MISCELLANEOUS) ×3 IMPLANT
DERMABOND ADVANCED (GAUZE/BANDAGES/DRESSINGS) ×1
DERMABOND ADVANCED .7 DNX12 (GAUZE/BANDAGES/DRESSINGS) ×1 IMPLANT
DRAPE UTILITY 15X26 W/TAPE STR (DRAPE) ×4 IMPLANT
ELECT REM PT RETURN 9FT ADLT (ELECTROSURGICAL) ×2
ELECTRODE REM PT RTRN 9FT ADLT (ELECTROSURGICAL) ×1 IMPLANT
ENDOLOOP SUT PDS II  0 18 (SUTURE)
ENDOLOOP SUT PDS II 0 18 (SUTURE) IMPLANT
GLOVE BIOGEL PI IND STRL 8 (GLOVE) ×1 IMPLANT
GLOVE BIOGEL PI INDICATOR 8 (GLOVE) ×1
GLOVE ECLIPSE 7.5 STRL STRAW (GLOVE) ×1 IMPLANT
GOWN STRL NON-REIN LRG LVL3 (GOWN DISPOSABLE) ×4 IMPLANT
KIT BASIN OR (CUSTOM PROCEDURE TRAY) ×2 IMPLANT
KIT ROOM TURNOVER OR (KITS) ×2 IMPLANT
NS IRRIG 1000ML POUR BTL (IV SOLUTION) ×2 IMPLANT
PAD ARMBOARD 7.5X6 YLW CONV (MISCELLANEOUS) ×4 IMPLANT
PENCIL BUTTON HOLSTER BLD 10FT (ELECTRODE) IMPLANT
POUCH SPECIMEN RETRIEVAL 10MM (ENDOMECHANICALS) ×2 IMPLANT
RELOAD /EVU35 (ENDOMECHANICALS) ×1 IMPLANT
RELOAD CUTTER ETS 35MM STAND (ENDOMECHANICALS) IMPLANT
SET IRRIG TUBING LAPAROSCOPIC (IRRIGATION / IRRIGATOR) ×2 IMPLANT
SPECIMEN JAR SMALL (MISCELLANEOUS) ×2 IMPLANT
SUT MNCRL AB 4-0 PS2 18 (SUTURE) ×2 IMPLANT
TOWEL OR 17X24 6PK STRL BLUE (TOWEL DISPOSABLE) ×2 IMPLANT
TOWEL OR 17X26 10 PK STRL BLUE (TOWEL DISPOSABLE) ×2 IMPLANT
TRAY FOLEY CATH 14FR (SET/KITS/TRAYS/PACK) ×2 IMPLANT
TRAY FOLEY METER SIL LF 16FR (CATHETERS) ×1 IMPLANT
TRAY LAPAROSCOPIC (CUSTOM PROCEDURE TRAY) ×2 IMPLANT
TROCAR XCEL 12X100 BLDLESS (ENDOMECHANICALS) ×2 IMPLANT
TROCAR XCEL BLUNT TIP 100MML (ENDOMECHANICALS) ×2 IMPLANT
TROCAR XCEL NON-BLD 5MMX100MML (ENDOMECHANICALS) ×2 IMPLANT
WATER STERILE IRR 1000ML POUR (IV SOLUTION) IMPLANT

## 2011-11-24 NOTE — ED Notes (Signed)
Vomiting that started last night w/ lower abd pain sugars have been higher called his dr and was told to come to er

## 2011-11-24 NOTE — Op Note (Signed)
OPERATIVE REPORT  DATE OF OPERATION: 11/24/2011  PATIENT:  Jeff [name]  46 y.o. male  PRE-OPERATIVE DIAGNOSIS:  Appendicitis  POST-OPERATIVE DIAGNOSIS:  Appendicitis  PROCEDURE:  Procedure(s): APPENDECTOMY LAPAROSCOPIC  SURGEON:  Surgeon(s): Cherylynn Ridges, MD  ASSISTANT: None  ANESTHESIA:   general  EBL: 100 ml  BLOOD ADMINISTERED: none  DRAINS: none   SPECIMEN:  Source of Specimen:  Appendix  COUNTS CORRECT:  YES  PROCEDURE DETAILS: The patient was taken to the operating room and placed on the table in the supine position. After an adequate general endotracheal anesthetic was administered he was prepped and draped in usual sterile manner exposing his entire abdomen.  After proper time out was performed identifying the patient and the procedure to be performed a supraumbilical midline incision was made using a #15 blade and taken down to the midline fascia. We incised the midline fascia with a 15 blade where there was some brisk bleeding from some fascial blood vessels. This was cauterized. The edges of the fascial grabbed with a Kocher clamp and then we bluntly dissected down into the peritoneal cavity with the Kelly clamp. Once this was performed a fascial stitch of 0 Vicryl was passed around the fascial opening. A Hassan cannula was then passed into the peritoneal cavity and secured in place with the pursestring 0 Vicryl. Commandants I gas was insufflated through the [facility] cannula up to a maximal intra-abdominal pressure 15 mm of mercury.  Once this was performed a right upper quadrant 5 mm cannula and the left lower quadrant 12 mm cannula were passed under direct vision.  The appendix was somewhat tethered to the right lower quadrant and the lateral aspect of the abdominal wall by adhesions. The terminal ileum was draped over the mesoappendix and the distal portion of the cecum. After dissecting it free we able to pass a 3.5 mm blue Endo GIA across the base of the  appendix. This detached appendix from the base of the cecum. We then used a 2.5 mm white Endo GIA across the mesoappendix detaching it completely. I removed the appendix from the left lower quadrant using an Endo Catch bag.  Was bleeding from the mesoappendix which had to be controlled with electrocautery. Once this was done the bleeding stopped. Once bleeding was controlled we irrigated with about 4 L of saline to make sure all the old blood had washed away and there was no acute bleeding continuing. Once this was completed we removed the [facility] cannula and tied off the fascial opening using the stitch that was in place. We aspirated all fluid and gas from above the liver and removed all cannulas.  All counts were correct at that point including needle sponges and instruments. Cortisone Marcaine was injected at all sites. This was combined with epinephrine. The left lower quadrant in the supraumbilical skin sites were closed using a running 4-0 Monocryl suture. Dermabond Steri-Strips and Tegaderms use complete the dressings  PATIENT DISPOSITION:  PACU - hemodynamically stable.   [name],Dinara Lupu O 5/29/20138:57 PM

## 2011-11-24 NOTE — ED Notes (Signed)
Dr Wyatt at bedside.  

## 2011-11-24 NOTE — Anesthesia Preprocedure Evaluation (Addendum)
Anesthesia Evaluation  Patient identified by MRN, date of birth, ID band Patient awake    Reviewed: Allergy & Precautions, H&P , NPO status , Patient's Chart, lab work & pertinent test results  Airway Mallampati: II TM Distance: >3 FB Neck ROM: full    Dental  (+) Dental Advidsory Given   Pulmonary sleep apnea ,          Cardiovascular hypertension, Pt. on home beta blockers + CAD, + Past MI, + Cardiac Stents and + CABG Rhythm:regular Rate:Normal     Neuro/Psych Seizures -, Well Controlled,  PSYCHIATRIC DISORDERS    GI/Hepatic   Endo/Other  Diabetes mellitus-, Well Controlled, Type 1, Insulin Dependent  Renal/GU      Musculoskeletal   Abdominal   Peds  Hematology   Anesthesia Other Findings   Reproductive/Obstetrics                          Anesthesia Physical Anesthesia Plan  ASA: III  Anesthesia Plan: General ETT and General   Post-op Pain Management:    Induction: Intravenous  Airway Management Planned: Oral ETT  Additional Equipment:   Intra-op Plan:   Post-operative Plan: Extubation in OR  Informed Consent: I have reviewed the patients History and Physical, chart, labs and discussed the procedure including the risks, benefits and alternatives for the proposed anesthesia with the patient or authorized representative who has indicated his/her understanding and acceptance.   Dental Advisory Given  Plan Discussed with: CRNA, Anesthesiologist and Surgeon  Anesthesia Plan Comments:        Anesthesia Quick Evaluation

## 2011-11-24 NOTE — ED Notes (Signed)
Pt reports that last night about 10pm he began having LLQ abdominal pain that has been constant. Pt states he tried several times to have a BM but was unsuccessful. Pt states he simultaneously was experiencing nausea and dry heaves but no vomiting. States family hx of diverticulitis. Pts last BM was this AM which was described as runny. Last po intake yesterday evening with dinner.

## 2011-11-24 NOTE — Anesthesia Procedure Notes (Signed)
Procedure Name: Intubation Date/Time: 11/24/2011 7:21 PM Performed by: Carmela Rima Pre-anesthesia Checklist: Patient identified, Timeout performed, Emergency Drugs available, Suction available and Patient being monitored Patient Re-evaluated:Patient Re-evaluated prior to inductionOxygen Delivery Method: Circle system utilized Preoxygenation: Pre-oxygenation with 100% oxygen Intubation Type: IV induction Ventilation: Mask ventilation without difficulty Laryngoscope Size: 3 Grade View: Grade II Tube type: Oral Tube size: 7.5 mm Number of attempts: 2 (Second attempt per Dr. Katrinka Blazing) Placement Confirmation: ETT inserted through vocal cords under direct vision,  breath sounds checked- equal and bilateral,  positive ETCO2 and CO2 detector Secured at: 23 cm Tube secured with: Tape Dental Injury: Teeth and Oropharynx as per pre-operative assessment

## 2011-11-24 NOTE — H&P (Signed)
Jeff [name] is an 46 y.o. male.   Chief Complaint: Abdominal pain and CT demonstrated acute appendicitis HPI: Pain started yesterday evening at about 10, suprapubic and RLQ.  Associated with nausea, Dry heaves.  Pain did not go away.  Sent to ED with concerns of DKA because of history of severe diabetes.  Has not taken any Effient today.  Has significant cardiac history, previous CABG and stents.  Past Medical History  Diagnosis Date  . CAD (coronary artery disease)     s/p CABG, NSTEMI  . Diabetes mellitus type I     on insulin pump at  home  . Sleep apnea     s/p oral surgery  . Hyperlipidemia   . 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 Diabetic retinopathy     s/p vitrectomy and history of retinal surgery  . HTN (hypertension)   . Seizure disorder   . Seasonal allergic rhinitis   . Depression     Past Surgical History  Procedure Date  . Coronary artery bypass graft   . Uvulopalatoplasty surgery   . Tonsillectomy   . Release of right transverse carpal ligament   . Right eye vitrectomy and detached retina repair 10/2007    Family History  Problem Relation Age of Onset  . Coronary artery disease Father   . Diabetes Father     type II  . Hypertension Father   . Squamous cell carcinoma Brother   . Arthritis Sister    Social History:  reports that he has never smoked. He has never used smokeless tobacco. He reports that he does not drink alcohol or use illicit drugs.  Allergies:  Allergies  Allergen Reactions  . Latex Rash     (Not in a hospital admission)  Results for orders placed during the hospital encounter of 11/24/11 (from the past 48 hour(s))  GLUCOSE, CAPILLARY     Status: Abnormal   Collection Time   11/24/11 10:26 AM      Component Value Range Comment   Glucose-Capillary 239 (*) 70 - 99 (mg/dL)   CBC     Status: Abnormal   Collection Time   11/24/11 11:03 AM      Component Value Range Comment   WBC 11.2 (*) 4.0  - 10.5 (K/uL)    RBC 4.57  4.22 - 5.81 (MIL/uL)    Hemoglobin 14.2  13.0 - 17.0 (g/dL)    HCT 16.1  09.6 - 04.5 (%)    MCV 87.7  78.0 - 100.0 (fL)    MCH 31.1  26.0 - 34.0 (pg)    MCHC 35.4  30.0 - 36.0 (g/dL)    RDW 40.9  81.1 - 91.4 (%)    Platelets 167  150 - 400 (K/uL)   DIFFERENTIAL     Status: Abnormal   Collection Time   11/24/11 11:03 AM      Component Value Range Comment   Neutrophils Relative 82 (*) 43 - 77 (%)    Neutro Abs 9.1 (*) 1.7 - 7.7 (K/uL)    Lymphocytes Relative 8 (*) 12 - 46 (%)    Lymphs Abs 0.9  0.7 - 4.0 (K/uL)    Monocytes Relative 10  3 - 12 (%)    Monocytes Absolute 1.1 (*) 0.1 - 1.0 (K/uL)    Eosinophils Relative 0  0 - 5 (%)    Eosinophils Absolute 0.0  0.0 - 0.7 (K/uL)    Basophils Relative  0  0 - 1 (%)    Basophils Absolute 0.0  0.0 - 0.1 (K/uL)   BASIC METABOLIC PANEL     Status: Abnormal   Collection Time   11/24/11 11:03 AM      Component Value Range Comment   Sodium 137  135 - 145 (mEq/L)    Potassium 4.4  3.5 - 5.1 (mEq/L)    Chloride 100  96 - 112 (mEq/L)    CO2 28  19 - 32 (mEq/L)    Glucose, Bld 209 (*) 70 - 99 (mg/dL)    BUN 13  6 - 23 (mg/dL)    Creatinine, Ser 1.61  0.50 - 1.35 (mg/dL)    Calcium 9.5  8.4 - 10.5 (mg/dL)    GFR calc non Af Amer >90  >90 (mL/min)    GFR calc Af Amer >90  >90 (mL/min)   URINALYSIS, ROUTINE W REFLEX MICROSCOPIC     Status: Abnormal   Collection Time   11/24/11 12:31 PM      Component Value Range Comment   Color, Urine YELLOW  YELLOW     APPearance CLEAR  CLEAR     Specific Gravity, Urine 1.028  1.005 - 1.030     pH 7.5  5.0 - 8.0     Glucose, UA 100 (*) NEGATIVE (mg/dL)    Hgb urine dipstick NEGATIVE  NEGATIVE     Bilirubin Urine SMALL (*) NEGATIVE     Ketones, ur 40 (*) NEGATIVE (mg/dL)    Protein, ur NEGATIVE  NEGATIVE (mg/dL)    Urobilinogen, UA 0.2  0.0 - 1.0 (mg/dL)    Nitrite NEGATIVE  NEGATIVE     Leukocytes, UA TRACE (*) NEGATIVE    URINE MICROSCOPIC-ADD ON     Status: Normal    Collection Time   11/24/11 12:31 PM      Component Value Range Comment   Squamous Epithelial / LPF RARE  RARE     WBC, UA 0-2  <3 (WBC/hpf)   TYPE AND SCREEN     Status: Normal   Collection Time   11/24/11  5:17 PM      Component Value Range Comment   ABO/RH(D) O NEG      Antibody Screen NEG      Sample Expiration 11/27/2011      Dg Chest 2 View  11/24/2011  *RADIOLOGY REPORT*  Clinical Data: Chest and abdominal pain.  Nausea and fever.  CHEST - 2 VIEW  Comparison: 06/02/2011.  Findings: Trachea is midline.  Heart size normal.  Lungs are clear. No pleural fluid.  IMPRESSION: No acute findings.  Original Report Authenticated By: Reyes Ivan, M.D.   Ct Abdomen Pelvis W Contrast  11/24/2011  *RADIOLOGY REPORT*  Clinical Data: Left lower quadrant abdominal pain.  Nausea.  CT ABDOMEN AND PELVIS WITH CONTRAST  Technique:  Multidetector CT imaging of the abdomen and pelvis was performed following the standard protocol during bolus administration of intravenous contrast.  Contrast: OMNIPAQUE IOHEXOL 300 MG/ML  SOLN  Comparison: None.  Findings: The appendix is thickened measuring up to 17 mm, with mild periappendiceal inflammatory changes.  This is consistent with acute appendicitis.  Mild secondary thickening of the distal ileum is seen.  No evidence of abscess or free fluid.  No evidence of bowel obstruction.  The abdominal parenchymal organs are normal appearance.  Gallbladder is unremarkable.  No evidence of soft tissue masses or lymphadenopathy.  IMPRESSION:  1. Positive for acute appendicitis. 2.  No evidence  of abscess or other complication.  These results were called by telephone on 11/24/2011  at  1640 hours to  Dr. Weldon Inches in the emergency department, who verbally acknowledged these results.  Original Report Authenticated By: Danae Orleans, M.D.    Review of Systems  Constitutional: Negative for fever and chills.  Eyes: Negative.   Respiratory: Negative.   Cardiovascular:  Negative.   Gastrointestinal: Positive for nausea and abdominal pain (suprapubic and RLQ).  Genitourinary: Negative.   Musculoskeletal: Negative.   Skin: Negative.   Neurological: Negative.   Endo/Heme/Allergies: Negative.   Psychiatric/Behavioral: Negative.     Blood pressure 119/64, pulse 86, temperature 98.6 F (37 C), temperature source Oral, resp. rate 18, SpO2 98.00%. Physical Exam  Constitutional: He is oriented to person, place, and time. He appears well-developed and well-nourished.  HENT:  Head: Normocephalic and atraumatic.  Eyes: EOM are normal. Right pupil is not round.  Cardiovascular: Normal rate, regular rhythm and normal heart sounds.   Respiratory: Effort normal and breath sounds normal.  GI: Soft. Bowel sounds are decreased. There is tenderness in the right lower quadrant and suprapubic area. There is rebound and guarding.  Musculoskeletal: Normal range of motion.  Neurological: He is alert and oriented to person, place, and time. He has normal reflexes.  Psychiatric: He has a normal mood and affect. His behavior is normal. Judgment and thought content normal.     Assessment/Plan Acute appendicitis by CT and clinically. He has not taken any of his medications in 24 hours.  On Insulin pump, but sugars have been in the low 200's.  Will discontinue through surgery and restart afterwards.  To OR for laparoscopic appendectomy.  Given Zosyn preoperatively.  Will not have to give platelets since patient has not taken Effient for > 24 hours.  Janashia Parco III,Mikylah Ackroyd O 11/24/2011, 6:09 PM

## 2011-11-24 NOTE — ED Notes (Signed)
Pt to be moved to CDU

## 2011-11-24 NOTE — ED Provider Notes (Signed)
History     CSN: [id number]  Arrival date & time 11/24/11  1022   First MD Initiated Contact with Patient 11/24/11 1202      Chief Complaint  Patient presents with  . Emesis    (Consider location/radiation/quality/duration/timing/severity/associated sxs/prior treatment) Patient is a 46 y.o. male presenting with vomiting. The history is provided by the patient.  Emesis  Associated symptoms include abdominal pain, cough and a fever. Pertinent negatives include no chills, no diarrhea and no headaches.   the patient is a 46 year old, insulin-dependent diabetic, who presents to emergency department complaining of nausea and vomiting.  Since last night.  He  also has had  crampy lower abdominal pain.  He attempted to have a bowel movement.  Several time, but nothing came out.  He also had mild elevation of his blood sugar so, he called his primary care Dr. who told him to come to the emergency department as they were concerned he may have DKA.  The patient states that he has had a slight nonproductive cough for which she is taking Tessalon.  Presently, he is only very mild nausea, and abdominal cramps, and does not want any medications for this.  Past Medical History  Diagnosis Date  . CAD (coronary artery disease)     s/p CABG, NSTEMI  . Diabetes mellitus type I     on insulin pump at  home  . Sleep apnea     s/p oral surgery  . Hyperlipidemia   . 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 Diabetic retinopathy     s/p vitrectomy and history of retinal surgery  . HTN (hypertension)   . Seizure disorder   . Seasonal allergic rhinitis   . Depression     Past Surgical History  Procedure Date  . Coronary artery bypass graft   . Uvulopalatoplasty surgery   . Tonsillectomy   . Release of right transverse carpal ligament   . Right eye vitrectomy and detached retina repair 10/2007    Family History  Problem Relation Age of Onset  . Coronary artery  disease Father   . Diabetes Father     type II  . Hypertension Father   . Squamous cell carcinoma Brother   . Arthritis Sister     History  Substance Use Topics  . Smoking status: Never Smoker   . Smokeless tobacco: Never Used  . Alcohol Use: No      Review of Systems  Constitutional: Positive for fever. Negative for chills.  Respiratory: Positive for cough. Negative for shortness of breath.   Cardiovascular: Negative for chest pain.  Gastrointestinal: Positive for nausea, vomiting, abdominal pain and constipation. Negative for diarrhea.  Skin: Negative for rash.  Neurological: Negative for weakness and headaches.  Psychiatric/Behavioral: Negative for confusion.  All other systems reviewed and are negative.    Allergies  Latex  Home Medications   Current Outpatient Rx  Name Route Sig Dispense Refill  . ACETAMINOPHEN 500 MG PO TABS Oral Take 1,000 mg by mouth every 6 (six) hours as needed. As needed for pain.     . ASPIRIN 81 MG PO TABS Oral Take 81 mg by mouth daily.      [name] BENZONATATE 100 MG PO CAPS Oral Take 100 mg by mouth 3 (three) times daily as needed. As needed for cough.    . CARBAMAZEPINE ER 200 MG PO TB12 Oral Take 600 mg by mouth 2 (two) times daily.     [name]  DOCUSATE SODIUM 100 MG PO CAPS Oral Take 100 mg by mouth daily.      [name] EFFIENT 10 MG PO TABS  take 1 tablet by mouth once daily 30 tablet 6    Patient needs to call for follow-up appointment  . EZETIMIBE 10 MG PO TABS Oral Take 10 mg by mouth at bedtime.     . GUAIFENESIN ER 600 MG PO TB12 Oral Take 600-1,200 mg by mouth 2 (two) times daily. As needed for congestion.    . INSULIN ASPART 100 UNIT/ML [place] SOLN Subcutaneous Inject into the skin. Insulin pump. Use as directed    . LEVETIRACETAM 500 MG PO TABS Oral Take 250-500 mg by mouth 2 (two) times daily. Take 1/2 tablet in the morning and 1 tablet in the evening    . LISINOPRIL 10 MG PO TABS Oral Take 5 mg by mouth daily.     [name] METOPROLOL TARTRATE 25 MG PO  TABS  take 1 tablet by mouth twice a day 60 tablet 2    Refill Approved  . ONE-A-DAY MENS PO TABS Oral Take 1 tablet by mouth daily.     [name] NITROGLYCERIN 0.4 MG SL SUBL Sublingual Place 1 tablet (0.4 mg total) under the tongue every 5 (five) minutes as needed. 25 tablet 3  . NON FORMULARY  Oxygen at 2 liters/min at night     . ROSUVASTATIN CALCIUM 20 MG PO TABS Oral Take 20 mg by mouth at bedtime.     . SERTRALINE HCL 100 MG PO TABS Oral Take 150 mg by mouth at bedtime. One and half tablet at bedtime      BP 173/75  Pulse 121  Temp 99.3 F (37.4 C)  Resp 16  SpO2 95%  Physical Exam  Nursing note and vitals reviewed. Constitutional: He is oriented to person, place, and time. No distress.       Obese  HENT:  Head: Normocephalic and atraumatic.  Eyes: Conjunctivae are normal.  Neck: Normal range of motion. Neck supple.  Cardiovascular:  No murmur heard.      Tachycardia  Pulmonary/Chest: Effort normal. No respiratory distress. He has no rales.  Abdominal: Soft. There is tenderness. There is no rebound and no guarding.       Mild bilateral lower abnormal tenderness, with no peritoneal signs  Musculoskeletal: Normal range of motion.  Neurological: He is alert and oriented to person, place, and time.  Skin: Skin is warm and dry.  Psychiatric: He has a normal mood and affect. Thought content normal.    ED Course  Procedures (including critical care time) 46 year old, insulin-dependent diabetic, male, presents emergency department with nausea, vomiting, constipation, and mild tachycardia, and elevated blood sugar.  His nausea and abdominal cramps are controlled at this time.  We will continue to hydrate him and perform laboratory testing for further evaluation.  Labs Reviewed  GLUCOSE, CAPILLARY - Abnormal; Notable for the following:    Glucose-Capillary 239 (*)    All other components within normal limits  CBC - Abnormal; Notable for the following:    WBC 11.2 (*)    All other  components within normal limits  DIFFERENTIAL - Abnormal; Notable for the following:    Neutrophils Relative 82 (*)    Neutro Abs 9.1 (*)    Lymphocytes Relative 8 (*)    Monocytes Absolute 1.1 (*)    All other components within normal limits  BASIC METABOLIC PANEL - Abnormal; Notable for the following:    Glucose, Bld  209 (*)    All other components within normal limits  URINALYSIS, ROUTINE W REFLEX MICROSCOPIC   No results found.   No diagnosis found.    MDM   abd pain       Cheri Guppy, MD 11/25/11 [phone]

## 2011-11-24 NOTE — Preoperative (Signed)
Beta Blockers   Reason not to administer Beta Blockers:Not Applicable 

## 2011-11-24 NOTE — ED Notes (Signed)
Pt returned from CT °

## 2011-11-24 NOTE — ED Notes (Signed)
Report received, assumed care.  

## 2011-11-24 NOTE — ED Notes (Signed)
PT transferred to CT.

## 2011-11-24 NOTE — Transfer of Care (Signed)
Immediate Anesthesia Transfer of Care Note  Patient: Jeff [name]  Procedure(s) Performed: Procedure(s) (LRB): APPENDECTOMY LAPAROSCOPIC (N/A)  Patient Location: PACU  Anesthesia Type: General  Level of Consciousness: awake, alert  and oriented  Airway & Oxygen Therapy: Patient Spontanous Breathing and Patient connected to face mask oxygen  Post-op Assessment: Report given to PACU RN, Post -op Vital signs reviewed and stable and Patient moving all extremities X 4  Post vital signs: Reviewed and stable  Complications: No apparent anesthesia complications

## 2011-11-24 NOTE — Anesthesia Postprocedure Evaluation (Signed)
  Anesthesia Post-op Note  Patient: Jeff [name]  Procedure(s) Performed: Procedure(s) (LRB): APPENDECTOMY LAPAROSCOPIC (N/A)  Patient Location: PACU  Anesthesia Type: General  Level of Consciousness: awake, alert , oriented and patient cooperative  Airway and Oxygen Therapy: Patient Spontanous Breathing and Patient connected to nasal cannula oxygen  Post-op Pain: mild  Post-op Assessment: Post-op Vital signs reviewed, Patient's Cardiovascular Status Stable, Respiratory Function Stable, Patent Airway, No signs of Nausea or vomiting and Pain level controlled  Post-op Vital Signs: stable  Complications: No apparent anesthesia complications

## 2011-11-25 ENCOUNTER — Encounter (HOSPITAL_COMMUNITY): Payer: Self-pay | Admitting: General Surgery

## 2011-11-25 LAB — GLUCOSE, CAPILLARY
Glucose-Capillary: 74 mg/dL (ref 70–99)
Glucose-Capillary: 91 mg/dL (ref 70–99)
Glucose-Capillary: 190 mg/dL — ABNORMAL HIGH (ref 70–99)
Glucose-Capillary: 62 mg/dL — ABNORMAL LOW (ref 70–99)

## 2011-11-25 LAB — HEMOGLOBIN A1C
Hgb A1c MFr Bld: 7.1 % — ABNORMAL HIGH (ref <5.7)
Mean Plasma Glucose: 157 mg/dL — ABNORMAL HIGH (ref <117)

## 2011-11-25 MED ORDER — INSULIN PUMP
Freq: Three times a day (TID) | SUBCUTANEOUS | Status: DC
  Administered 2011-11-25: 8.5 via SUBCUTANEOUS
  Administered 2011-11-25: 9.9 via SUBCUTANEOUS
  Administered 2011-11-25: 10.8 via SUBCUTANEOUS
  Administered 2011-11-26: 8.4 via SUBCUTANEOUS
  Administered 2011-11-26: 10.1 via SUBCUTANEOUS
  Administered 2011-11-26: 10.3 via SUBCUTANEOUS
  Administered 2011-11-27: 13.1 via SUBCUTANEOUS
  Filled 2011-11-25: qty 1

## 2011-11-25 NOTE — Progress Notes (Signed)
Patient ID: Jeff [name], male   DOB: 05/11/1966, 46 y.o.   MRN: [id number] 1 Day Post-Op  Subjective: Pt feels ok.  C/o soreness.  Urinating well.  Has replaced his insulin pump.  Objective: Vital signs in last 24 hours: Temp:  [98.6 F (37 C)-99.7 F (37.6 C)] 99.1 F (37.3 C) (05/30 0214) Pulse Rate:  [86-121] 102  (05/30 0214) Resp:  [16-31] 18  (05/30 0214) BP: (109-173)/(61-84) 133/71 mmHg (05/30 0214) SpO2:  [93 %-100 %] 95 % (05/30 0214) Weight:  [233 lb (105.688 kg)] 233 lb (105.688 kg) (05/29 1814)    Intake/Output from previous day: 05/29 0701 - 05/30 0700 In: 1000 [I.V.:1000] Out: 350 [Urine:250; Blood:100] Intake/Output this shift:    PE: Abd: soft, appropriately tender, +BS, ND, incisions c/d/i with no evidence of hematoma or bleeding.  Lab Results:   Basename 11/24/11 1103  WBC 11.2*  HGB 14.2  HCT 40.1  PLT 167   BMET  Basename 11/24/11 1103  NA 137  K 4.4  CL 100  CO2 28  GLUCOSE 209*  BUN 13  CREATININE 0.74  CALCIUM 9.5   PT/INR No results found for this basename: LABPROT:2,INR:2 in the last 72 hours CMP     Component Value Date/Time   NA 137 11/24/2011 1103   K 4.4 11/24/2011 1103   CL 100 11/24/2011 1103   CO2 28 11/24/2011 1103   GLUCOSE 209* 11/24/2011 1103   BUN 13 11/24/2011 1103   CREATININE 0.74 11/24/2011 1103   CALCIUM 9.5 11/24/2011 1103   PROT 6.6 06/03/2011 0640   ALBUMIN 3.9 06/03/2011 0640   AST 34 06/03/2011 0640   ALT 37 06/03/2011 0640   ALKPHOS 83 06/03/2011 0640   BILITOT 0.3 06/03/2011 0640   GFRNONAA >90 11/24/2011 1103   GFRAA >90 11/24/2011 1103   Lipase  No results found for this basename: lipase       Studies/Results: Dg Chest 2 View  11/24/2011  *RADIOLOGY REPORT*  Clinical Data: Chest and abdominal pain.  Nausea and fever.  CHEST - 2 VIEW  Comparison: 06/02/2011.  Findings: Trachea is midline.  Heart size normal.  Lungs are clear. No pleural fluid.  IMPRESSION: No acute findings.  Original Report  Authenticated By: Reyes Ivan, M.D.   Ct Abdomen Pelvis W Contrast  11/24/2011  *RADIOLOGY REPORT*  Clinical Data: Left lower quadrant abdominal pain.  Nausea.  CT ABDOMEN AND PELVIS WITH CONTRAST  Technique:  Multidetector CT imaging of the abdomen and pelvis was performed following the standard protocol during bolus administration of intravenous contrast.  Contrast: OMNIPAQUE IOHEXOL 300 MG/ML  SOLN  Comparison: None.  Findings: The appendix is thickened measuring up to 17 mm, with mild periappendiceal inflammatory changes.  This is consistent with acute appendicitis.  Mild secondary thickening of the distal ileum is seen.  No evidence of abscess or free fluid.  No evidence of bowel obstruction.  The abdominal parenchymal organs are normal appearance.  Gallbladder is unremarkable.  No evidence of soft tissue masses or lymphadenopathy.  IMPRESSION:  1. Positive for acute appendicitis. 2.  No evidence of abscess or other complication.  These results were called by telephone on 11/24/2011  at  1640 hours to  Dr. Weldon Inches in the emergency department, who verbally acknowledged these results.  Original Report Authenticated By: Danae Orleans, M.D.    Anti-infectives: Anti-infectives     Start     Dose/Rate Route Frequency Ordered Stop   11/24/11 2300   piperacillin-tazobactam (  ZOSYN) IVPB 3.375 g  Status:  Discontinued        3.375 g 100 mL/hr over 30 Minutes Intravenous 3 times per day 11/24/11 2210 11/24/11 2221   11/24/11 2230  piperacillin-tazobactam (ZOSYN) IVPB 3.375 g       3.375 g 12.5 mL/hr over 240 Minutes Intravenous  Once 11/24/11 2210 11/25/11 0348   11/24/11 1715  piperacillin-tazobactam (ZOSYN) IVPB 3.375 g       3.375 g 12.5 mL/hr over 240 Minutes Intravenous  Once 11/24/11 1702 11/24/11 2113           Assessment/Plan  1. S/p lap appy 2. On effient  Plan: 1. Mobilize and advance diet 2. pulm toilet 3. Will watch for bleeding given effient.  If stable may look  at dc tomorrow.  If hgb levels drop may need to stay til Saturday. 4. All home meds resumed.   LOS: 1 day    Aldair Rickel E 11/25/2011

## 2011-11-25 NOTE — Progress Notes (Signed)
Received patient per stretcher from PACU. S/P appendectomy, lap sites x3 intact no bleeding noted. Pt is arousable, oriented, accompanied by his wife. Pt. And family oriented to unit, reviewed MD orders, H&P done. Pt. To sleepy to be started on his insulin pump at this time-- will reevaluate at a later time. No other questions or concerns. Will continue to monitor. [name]

## 2011-11-25 NOTE — Progress Notes (Signed)
Insulin pump started by patient as per MD orders. Patient contract for insulin pump therapy signed and witnessed. Will continue to monitor.

## 2011-11-25 NOTE — Progress Notes (Signed)
11/25/11 Spoke with patient about his diabetes.  On Medtronic insulin pump.  Basal rates: 12 AM 1.1 unit/hr; 6 Am 1.9 units/hr. 10 AM 2.0 units/hr. 12 PM 2.2 units/hr. 1800 2.1 units/hr.  CHO ratio is 1:7; correction factor is 26; goal blood sugars 120 mg/dl at 12 AM; 119 mg/dl at 1478.  Known well to the Inpatient Diabetes Program team.  Will continue to follow while in hospital.

## 2011-11-25 NOTE — Progress Notes (Signed)
Agree with above. Doing well. Watch for bleeding.

## 2011-11-26 ENCOUNTER — Encounter (HOSPITAL_COMMUNITY): Payer: Self-pay

## 2011-11-26 LAB — CBC
MCH: 31.6 pg (ref 26.0–34.0)
MCHC: 34.5 g/dL (ref 30.0–36.0)
MCV: 91.4 fL (ref 78.0–100.0)
Platelets: 131 10*3/uL — ABNORMAL LOW (ref 150–400)
RBC: 3.61 MIL/uL — ABNORMAL LOW (ref 4.22–5.81)

## 2011-11-26 LAB — GLUCOSE, CAPILLARY
Glucose-Capillary: 146 mg/dL — ABNORMAL HIGH (ref 70–99)
Glucose-Capillary: 179 mg/dL — ABNORMAL HIGH (ref 70–99)
Glucose-Capillary: 168 mg/dL — ABNORMAL HIGH (ref 70–99)

## 2011-11-26 MED ORDER — HYDROCODONE-ACETAMINOPHEN 5-325 MG PO TABS: 1.0000 | ORAL_TABLET | ORAL | Status: AC | PRN

## 2011-11-26 NOTE — Progress Notes (Signed)
CBG: 66  Treatment: orange juice  Symptoms: None  Follow-up CBG: Time:2225 CBG Result:62  Possible Reasons for Event: Other: pt has insulin pump-?inadequate intake  Comments/MD notified:no    [place], [employer]

## 2011-11-26 NOTE — Discharge Instructions (Signed)
[facility] ______[facility] °LAPAROSCOPIC SURGERY: POST OP INSTRUCTIONS °Always review your discharge instruction sheet given to you by the facility where your surgery was performed. °IF YOU HAVE DISABILITY OR FAMILY LEAVE FORMS, YOU MUST BRING THEM TO THE OFFICE FOR PROCESSING.   °DO NOT GIVE THEM TO YOUR DOCTOR. ° °1. A prescription for pain medication may be given to you upon discharge.  Take your pain medication as prescribed, if needed.  If narcotic pain medicine is not needed, then you may take acetaminophen (Tylenol) or ibuprofen (Advil) as needed. °2. Take your usually prescribed medications unless otherwise directed. °3. If you need a refill on your pain medication, please contact your pharmacy.  They will contact our office to request authorization. Prescriptions will not be filled after 5pm or on week-ends. °4. You should follow a light diet the first few days after arrival home, such as soup and crackers, etc.  Be sure to include lots of fluids daily. °5. Most patients will experience some swelling and bruising in the area of the incisions.  Ice packs will help.  Swelling and bruising can take several days to resolve.  °6. It is common to experience some constipation if taking pain medication after surgery.  Increasing fluid intake and taking a stool softener (such as Colace) will usually help or prevent this problem from occurring.  A mild laxative (Milk of Magnesia or Miralax) should be taken according to package instructions if there are no bowel movements after 48 hours. °7. Unless discharge instructions indicate otherwise, you may remove your bandages 24-48 hours after surgery, and you may shower at that time.  You may have steri-strips (small skin tapes) in place directly over the incision.  These strips should be left on the skin for 7-10 days.  If your surgeon used skin glue on the incision, you may shower in 24 hours.  The glue will flake off over the next 2-3 weeks.  Any sutures or  staples will be removed at the office during your follow-up visit. °8. ACTIVITIES:  You may resume regular (light) daily activities beginning the next day--such as daily self-care, walking, climbing stairs--gradually increasing activities as tolerated.  You may have sexual intercourse when it is comfortable.  Refrain from any heavy lifting or straining until approved by your doctor. °a. You may drive when you are no longer taking prescription pain medication, you can comfortably wear a seatbelt, and you can safely maneuver your car and apply brakes. °b. RETURN TO WORK:  __________________________________________________________ °9. You should see your doctor in the office for a follow-up appointment approximately 2-3 weeks after your surgery.  Make sure that you call for this appointment within a day or two after you arrive home to insure a convenient appointment time. °10. OTHER INSTRUCTIONS: __________________________________________________________________________________________________________________________ __________________________________________________________________________________________________________________________ °WHEN TO CALL YOUR DOCTOR: °1. Fever over 101.0 °2. Inability to urinate °3. Continued bleeding from incision. °4. Increased pain, redness, or drainage from the incision. °5. Increasing abdominal pain ° °The clinic staff is available to answer your questions during regular business hours.  Please don’t hesitate to call and ask to speak to one of the nurses for clinical concerns.  If you have a medical emergency, go to the nearest emergency room or call 911.  A surgeon from [facility] is always on call at the hospital. °[address], [address], [place], [place]  [address] ? [address] [address], [place], [place]   [address] °[phone] ? [phone] ? FAX [phone] °Web site:   [email or web address] °

## 2011-11-26 NOTE — Progress Notes (Signed)
Patient ID: Jeff [name], male   DOB: 1966/05/28, 46 y.o.   MRN: [id number] 2 Days Post-Op  Subjective: Pt feels ok.  Nervous about going home.  Tolerating some regular food.  Objective: Vital signs in last 24 hours: Temp:  [98.4 F (36.9 C)-99.2 F (37.3 C)] 98.4 F (36.9 C) (05/31 0524) Pulse Rate:  [74-89] 74  (05/31 0524) Resp:  [19-20] 19  (05/31 0524) BP: (109-118)/(54-59) 118/56 mmHg (05/31 0524) SpO2:  [94 %-99 %] 99 % (05/31 0524) Last BM Date: 11/25/11  Intake/Output from previous day: 05/30 0701 - 05/31 0700 In: 3100.4 [P.O.:700; I.V.:2400.4] Out: 375 [Urine:375] Intake/Output this shift:    PE: Abd: soft, minimally tender, +BS, ND, incisions are c/d/i with no evidence of hematoma.  Lab Results:   Basename 11/26/11 0640 11/24/11 1103  WBC 5.4 11.2*  HGB 11.4* 14.2  HCT 33.0* 40.1  PLT 131* 167   BMET  Basename 11/24/11 1103  NA 137  K 4.4  CL 100  CO2 28  GLUCOSE 209*  BUN 13  CREATININE 0.74  CALCIUM 9.5   PT/INR No results found for this basename: LABPROT:2,INR:2 in the last 72 hours CMP     Component Value Date/Time   NA 137 11/24/2011 1103   K 4.4 11/24/2011 1103   CL 100 11/24/2011 1103   CO2 28 11/24/2011 1103   GLUCOSE 209* 11/24/2011 1103   BUN 13 11/24/2011 1103   CREATININE 0.74 11/24/2011 1103   CALCIUM 9.5 11/24/2011 1103   PROT 6.6 06/03/2011 0640   ALBUMIN 3.9 06/03/2011 0640   AST 34 06/03/2011 0640   ALT 37 06/03/2011 0640   ALKPHOS 83 06/03/2011 0640   BILITOT 0.3 06/03/2011 0640   GFRNONAA >90 11/24/2011 1103   GFRAA >90 11/24/2011 1103   Lipase  No results found for this basename: lipase       Studies/Results: Dg Chest 2 View  11/24/2011  *RADIOLOGY REPORT*  Clinical Data: Chest and abdominal pain.  Nausea and fever.  CHEST - 2 VIEW  Comparison: 06/02/2011.  Findings: Trachea is midline.  Heart size normal.  Lungs are clear. No pleural fluid.  IMPRESSION: No acute findings.  Original Report Authenticated By: Reyes Ivan, M.D.   Ct Abdomen Pelvis W Contrast  11/24/2011  *RADIOLOGY REPORT*  Clinical Data: Left lower quadrant abdominal pain.  Nausea.  CT ABDOMEN AND PELVIS WITH CONTRAST  Technique:  Multidetector CT imaging of the abdomen and pelvis was performed following the standard protocol during bolus administration of intravenous contrast.  Contrast: OMNIPAQUE IOHEXOL 300 MG/ML  SOLN  Comparison: None.  Findings: The appendix is thickened measuring up to 17 mm, with mild periappendiceal inflammatory changes.  This is consistent with acute appendicitis.  Mild secondary thickening of the distal ileum is seen.  No evidence of abscess or free fluid.  No evidence of bowel obstruction.  The abdominal parenchymal organs are normal appearance.  Gallbladder is unremarkable.  No evidence of soft tissue masses or lymphadenopathy.  IMPRESSION:  1. Positive for acute appendicitis. 2.  No evidence of abscess or other complication.  These results were called by telephone on 11/24/2011  at  1640 hours to  Dr. Weldon Inches in the emergency department, who verbally acknowledged these results.  Original Report Authenticated By: Danae Orleans, M.D.    Anti-infectives: Anti-infectives     Start     Dose/Rate Route Frequency Ordered Stop   11/24/11 2300   piperacillin-tazobactam (ZOSYN) IVPB 3.375 g  Status:  Discontinued        3.375 g 100 mL/hr over 30 Minutes Intravenous 3 times per day 11/24/11 2210 11/24/11 2221   11/24/11 2230  piperacillin-tazobactam (ZOSYN) IVPB 3.375 g       3.375 g 12.5 mL/hr over 240 Minutes Intravenous  Once 11/24/11 2210 11/25/11 0348   11/24/11 1715  piperacillin-tazobactam (ZOSYN) IVPB 3.375 g       3.375 g 12.5 mL/hr over 240 Minutes Intravenous  Once 11/24/11 1702 11/24/11 2113           Assessment/Plan  1. S/p lap appy 2. On effient, do not want to resume just yet 3. Type 1 DM  Plan: 1. Will keep one more day and recheck a cbc in the morning to make sure his hgb remains  stable.  If so then, anticipate dc in the morning. 2. Dc IVF, and SLIV   LOS: 2 days    Richele Strand E 11/26/2011

## 2011-11-26 NOTE — Progress Notes (Signed)
Hgb down some, follow up tomorrow. Hopefully home in AM

## 2011-11-26 NOTE — Progress Notes (Signed)
CBG: 62  Treatment: 15 GM carbohydrate snack  Symptoms: None  Follow-up CBG: Time:2255 CBG Result:90  Possible Reasons for Event: Other: pt has insulin pump-?inadequate intake  Comments/MD notified:no    [place], [employer]

## 2011-11-27 LAB — CBC
HCT: 33.4 % — ABNORMAL LOW (ref 39.0–52.0)
MCV: 90 fL (ref 78.0–100.0)
RBC: 3.71 MIL/uL — ABNORMAL LOW (ref 4.22–5.81)
WBC: 4.3 10*3/uL (ref 4.0–10.5)

## 2011-11-27 LAB — GLUCOSE, CAPILLARY
Glucose-Capillary: 72 mg/dL (ref 70–99)
Glucose-Capillary: 110 mg/dL — ABNORMAL HIGH (ref 70–99)
Glucose-Capillary: 214 mg/dL — ABNORMAL HIGH (ref 70–99)

## 2011-11-27 MED ORDER — HYDROCODONE-ACETAMINOPHEN 5-325 MG PO TABS: 1.0000 | ORAL_TABLET | ORAL | Status: DC | PRN

## 2011-11-27 NOTE — Progress Notes (Signed)
0200 Patient's cbg was 72. Patient ate 2 graham crackers with peanut butter and drink a gingerale. 0230 cbg 76 patient ate 2 graham crackers with peanut butter and drink a diet coke. 0300 cbg 110 will continue to monitor.

## 2011-11-27 NOTE — Progress Notes (Signed)
3 Days Post-Op  Subjective: Doing fairly well and wants to go home. Tolerating diet. Passing flatus and has had one loose stool. No voiding difficulties. Wife is here as well for conversation.  Hgb stable at 11.4.  Pathology shows acute, full-thickness, suppurative appendicitis.  Objective: Vital signs in last 24 hours: Temp:  [98.3 F (36.8 C)-98.6 F (37 C)] 98.6 F (37 C) (05/31 2215) Pulse Rate:  [72-80] 80  (05/31 2302) Resp:  [18] 18  (05/31 2215) BP: (114-134)/(58-61) 134/61 mmHg (05/31 2302) SpO2:  [96 %] 96 % (05/31 2215) Last BM Date: 11/26/11  Intake/Output from previous day: 05/31 0701 - 06/01 0700 In: 120 [P.O.:120] Out: -  Intake/Output this shift:    General appearance: alert. Stable. Mental status normal. No distress. GI: abdomen is soft, nondistended, minimal tenderness, trocar sites healing uneventfully. Benign exam.  Lab Results:  Results for orders placed during the hospital encounter of 11/24/11 (from the past 24 hour(s))  GLUCOSE, CAPILLARY     Status: Abnormal   Collection Time   11/26/11 12:09 PM      Component Value Range   Glucose-Capillary 179 (*) 70 - 99 (mg/dL)   Comment 1 Notify RN    GLUCOSE, CAPILLARY     Status: Abnormal   Collection Time   11/26/11  4:25 PM      Component Value Range   Glucose-Capillary 204 (*) 70 - 99 (mg/dL)   Comment 1 Notify RN    GLUCOSE, CAPILLARY     Status: Abnormal   Collection Time   11/26/11  5:16 PM      Component Value Range   Glucose-Capillary 176 (*) 70 - 99 (mg/dL)   Comment 1 Notify RN    GLUCOSE, CAPILLARY     Status: Abnormal   Collection Time   11/26/11 10:11 PM      Component Value Range   Glucose-Capillary 168 (*) 70 - 99 (mg/dL)  GLUCOSE, CAPILLARY     Status: Normal   Collection Time   11/27/11  2:01 AM      Component Value Range   Glucose-Capillary 72  70 - 99 (mg/dL)  GLUCOSE, CAPILLARY     Status: Normal   Collection Time   11/27/11  2:32 AM      Component Value Range   Glucose-Capillary 76  70 - 99 (mg/dL)  GLUCOSE, CAPILLARY     Status: Abnormal   Collection Time   11/27/11  2:59 AM      Component Value Range   Glucose-Capillary 110 (*) 70 - 99 (mg/dL)  CBC     Status: Abnormal   Collection Time   11/27/11  6:35 AM      Component Value Range   WBC 4.3  4.0 - 10.5 (K/uL)   RBC 3.71 (*) 4.22 - 5.81 (MIL/uL)   Hemoglobin 11.4 (*) 13.0 - 17.0 (g/dL)   HCT 82.9 (*) 56.2 - 52.0 (%)   MCV 90.0  78.0 - 100.0 (fL)   MCH 30.7  26.0 - 34.0 (pg)   MCHC 34.1  30.0 - 36.0 (g/dL)   RDW 13.0  86.5 - 78.4 (%)   Platelets 130 (*) 150 - 400 (K/uL)  GLUCOSE, CAPILLARY     Status: Abnormal   Collection Time   11/27/11  7:45 AM      Component Value Range   Glucose-Capillary 214 (*) 70 - 99 (mg/dL)   Comment 1 Notify RN       Studies/Results: @RISRSLT24 @     .  atorvastatin  40 mg Oral q1800  . carbamazepine  600 mg Oral BID  . docusate sodium  100 mg Oral Daily  . ezetimibe  10 mg Oral QHS  . guaiFENesin  600-1,200 mg Oral BID  . insulin aspart  0-20 Units Subcutaneous TID WC  . insulin pump   Subcutaneous TID AC, HS, 0200  . levETIRAcetam  250 mg Oral Daily  . levETIRAcetam  500 mg Oral QHS  . lisinopril  5 mg Oral Daily  . metoprolol tartrate  25 mg Oral BID  . multivitamin  1 tablet Oral Daily  . sertraline  150 mg Oral QHS     Assessment/Plan: s/p Procedure(s): APPENDECTOMY LAPAROSCOPIC  Doing well. Discharge home today. Resume all usual medications. Prescription for Vicodin given Diet and activities discussed Patient to call Monday to set up a pole Dr. Lindie Spruce in 3 weeks.    LOS: 3 days    Larz Mark M 11/27/2011  . .prob

## 2011-11-27 NOTE — Discharge Summary (Signed)
Patient ID: LC JOYNT [id number] 45 y.o. Dec 22, 1965  11/24/2011  Discharge date and time: November 27, 2011  Admitting Physician: Ernestene Mention  Discharge Physician: Ernestene Mention  Admission Diagnoses: Coronary atherosclerosis of autologous vein bypass graft [414.02] Acute appendicitis [540] SENT BY DR KETOACIDOSIS  Discharge Diagnoses: acute suppurative appendicitis  Operations: Procedure(s): APPENDECTOMY LAPAROSCOPIC  Admission Condition: fair  Discharged Condition: good  Indication for Admission: this 46 year old patient with coronary disease, diabetes, sleep apnea, hypertension, and seizure disorder presented to the emergency room on Nov 24 2011 with a one-day history of suprapubic and right lower quadrant pain and nausea. Abdominal exam reveals tenderness in the suprapubic area and right lower quadrant with guarding and rebound. CT scan suggested appendicitis. The patient was evaluated and admitted by Dr. Fayrene Fearing white.   Hospital Course: on the day of admission the patient was taken to the operating room and underwent laparoscopic appendectomy. Clinically he had acute appendicitis. Final pathology report shows acute suppurative appendicitis.  Postoperatively the patient did reasonably well. He progressed slowly but uneventfully and his diet and activities. Because of his antiplatelet drug therapy we monitored his hemoglobin which remained stable.  On the day of discharge the patient was independent with ambulation, tolerating diet, had had a bowel movement and felt ready to go home. His wounds looked fine.  He was given a prescription for Vicodin for pain. He was told to continue his usual medications. Diet and  activities were discussed. He was advised to call our office to make up on to see Dr. Lindie Spruce in about 3 weeks.  Consults: None  Significant Diagnostic Studies: radiology: CT scan: abdomen  Treatments: surgery: laparoscopic appendectomy  Disposition:  Home  Patient Instructions:   Weylin, Plagge  Home Medication Instructions [id number]   Printed on:11/27/11 0912  Medication Information                    aspirin 81 MG tablet Take 81 mg by mouth daily.             levETIRAcetam (KEPPRA) 500 MG tablet Take 250-500 mg by mouth 2 (two) times daily. Take 1/2 tablet in the morning and 1 tablet in the evening           multivitamin (ONE-A-DAY MEN'S) TABS Take 1 tablet by mouth daily.            sertraline (ZOLOFT) 100 MG tablet Take 150 mg by mouth at bedtime. One and half tablet at bedtime           docusate sodium (COLACE) 100 MG capsule Take 100 mg by mouth daily.             insulin aspart (NOVOLOG) 100 UNIT/ML injection Inject into the skin. Insulin pump. Use as directed           NON FORMULARY Oxygen at 2 liters/min at night            lisinopril (PRINIVIL,ZESTRIL) 10 MG tablet Take 5 mg by mouth daily.            rosuvastatin (CRESTOR) 20 MG tablet Take 20 mg by mouth at bedtime.            ezetimibe (ZETIA) 10 MG tablet Take 10 mg by mouth at bedtime.            guaiFENesin (MUCINEX) 600 MG 12 hr tablet Take 600-1,200 mg by mouth 2 (two) times daily. As needed for congestion.  benzonatate (TESSALON) 100 MG capsule Take 100 mg by mouth 3 (three) times daily as needed. As needed for cough.           nitroGLYCERIN (NITROSTAT) 0.4 MG SL tablet Place 1 tablet (0.4 mg total) under the tongue every 5 (five) minutes as needed.           acetaminophen (TYLENOL) 500 MG tablet Take 1,000 mg by mouth every 6 (six) hours as needed. As needed for pain.            carbamazepine (TEGRETOL XR) 200 MG 12 hr tablet Take 600 mg by mouth 2 (two) times daily.            EFFIENT 10 MG TABS take 1 tablet by mouth once daily           metoprolol tartrate (LOPRESSOR) 25 MG tablet take 1 tablet by mouth twice a day           HYDROcodone-acetaminophen (NORCO) 5-325 MG per tablet Take 1-2 tablets by mouth every 4  (four) hours as needed.             Activity: no sports or heavy lifting for 2 weeks. Resume cardiac rehabilitation in 2 weeks. May drive car. May shower. Diet: cardiac diet Wound Care: as directed  Follow-up:  With Dr. Lindie Spruce in 3 weeks.  Signed: Angelia Mould. Derrell Lolling, M.D., FACS General and minimally invasive surgery Breast and Colorectal Surgery  11/27/2011, 9:12 AM

## 2011-11-27 NOTE — Progress Notes (Signed)
nsl dc'd and home care instructions gone over with patient. Follow up appointment and home medications gone over by Lisette Grinder, charge RN.

## 2011-11-29 ENCOUNTER — Encounter (HOSPITAL_COMMUNITY): Payer: Self-pay

## 2011-11-29 ENCOUNTER — Telehealth: Payer: Self-pay | Admitting: Cardiovascular Disease

## 2011-11-29 DIAGNOSIS — Z794 Long term (current) use of insulin: Secondary | ICD-10-CM | POA: Insufficient documentation

## 2011-11-29 DIAGNOSIS — Z9641 Presence of insulin pump (external) (internal): Secondary | ICD-10-CM | POA: Insufficient documentation

## 2011-11-29 DIAGNOSIS — I2510 Atherosclerotic heart disease of native coronary artery without angina pectoris: Secondary | ICD-10-CM | POA: Insufficient documentation

## 2011-11-29 DIAGNOSIS — I10 Essential (primary) hypertension: Secondary | ICD-10-CM | POA: Insufficient documentation

## 2011-11-29 DIAGNOSIS — G4733 Obstructive sleep apnea (adult) (pediatric): Secondary | ICD-10-CM | POA: Insufficient documentation

## 2011-11-29 DIAGNOSIS — Z951 Presence of aortocoronary bypass graft: Secondary | ICD-10-CM | POA: Insufficient documentation

## 2011-11-29 DIAGNOSIS — I252 Old myocardial infarction: Secondary | ICD-10-CM | POA: Insufficient documentation

## 2011-11-29 DIAGNOSIS — F341 Dysthymic disorder: Secondary | ICD-10-CM | POA: Insufficient documentation

## 2011-11-29 DIAGNOSIS — E109 Type 1 diabetes mellitus without complications: Secondary | ICD-10-CM | POA: Insufficient documentation

## 2011-11-29 DIAGNOSIS — E785 Hyperlipidemia, unspecified: Secondary | ICD-10-CM | POA: Insufficient documentation

## 2011-11-29 DIAGNOSIS — I498 Other specified cardiac arrhythmias: Secondary | ICD-10-CM | POA: Insufficient documentation

## 2011-11-29 DIAGNOSIS — Z79899 Other long term (current) drug therapy: Secondary | ICD-10-CM | POA: Insufficient documentation

## 2011-11-29 DIAGNOSIS — I319 Disease of pericardium, unspecified: Secondary | ICD-10-CM | POA: Insufficient documentation

## 2011-11-29 DIAGNOSIS — Q8500 Neurofibromatosis, unspecified: Secondary | ICD-10-CM | POA: Insufficient documentation

## 2011-11-29 DIAGNOSIS — Z5189 Encounter for other specified aftercare: Secondary | ICD-10-CM | POA: Insufficient documentation

## 2011-11-29 NOTE — Telephone Encounter (Signed)
Pt was notified.  

## 2011-11-29 NOTE — Telephone Encounter (Signed)
Ok to keep July appointment unless he begins having exertional chest pain - thx  Tonny Bollman 11/29/2011 12:28 PM

## 2011-11-29 NOTE — Telephone Encounter (Signed)
PT WAS SEEN IN ER FOR STOMACH PAIN/HAD AN APPENDECTOMY, WAS CALLING TO SEE IF NEEDS TO BE SEEN, HAS APPT IN JULY AND WONDERED IF JUST CAN KEEP THAT ONE, PLS CALL PT [phone]

## 2011-11-29 NOTE — Telephone Encounter (Signed)
Jeff [name] states his surgeon wanted him to call his cardiologist about whether or not he needs to be seen sooner than July because of his appendectomy.  Pt denies any new cardiology problems.  States he was having some chest pain after appy but when he changed position in his recliner, it went away so he feels it is more incisional rather than cardiac.

## 2011-12-01 ENCOUNTER — Encounter (HOSPITAL_COMMUNITY): Payer: Self-pay

## 2011-12-03 ENCOUNTER — Encounter (HOSPITAL_COMMUNITY): Payer: Self-pay

## 2011-12-06 ENCOUNTER — Encounter (HOSPITAL_COMMUNITY): Payer: Self-pay

## 2011-12-08 ENCOUNTER — Encounter (INDEPENDENT_AMBULATORY_CARE_PROVIDER_SITE_OTHER): Payer: Self-pay | Admitting: General Surgery

## 2011-12-08 ENCOUNTER — Ambulatory Visit (INDEPENDENT_AMBULATORY_CARE_PROVIDER_SITE_OTHER): Payer: BC Managed Care – PPO | Admitting: General Surgery

## 2011-12-08 ENCOUNTER — Encounter (HOSPITAL_COMMUNITY): Payer: Self-pay

## 2011-12-08 VITALS — BP 122/64 | HR 78 | Temp 97.0°F | Resp 14 | Ht 71.0 in | Wt 230.2 lb

## 2011-12-08 DIAGNOSIS — Z09 Encounter for follow-up examination after completed treatment for conditions other than malignant neoplasm: Secondary | ICD-10-CM

## 2011-12-08 NOTE — Progress Notes (Signed)
HPI The patient is doing well status post laparoscopic appendectomy for acute appendicitis. He continues to have some twinges of pain in his right lower quadrant but no fevers and chills and no problems with bowel movements.  PE His wounds have healed well with no evidence of infection. He has normal active bowel sounds.  Studiy review None.  Assessment During while status post laparoscopic appendectomy. The patient is afebrile diabetic on insulin pump and as the question is whether or not he would matter where to position his pump. I told him to keep the pump away from areas of his incision has healed detorsion of his insulin may be variable lymphocytes.  Plan Return to see me on a p.r.n. basis. He gave the patient a copy of his pathology report. He is eligible to restart cardiac rehabilitation on Monday.

## 2011-12-10 ENCOUNTER — Encounter (HOSPITAL_COMMUNITY): Payer: Self-pay

## 2011-12-13 ENCOUNTER — Encounter (HOSPITAL_COMMUNITY)
Admission: RE | Admit: 2011-12-13 | Discharge: 2011-12-13 | Disposition: A | Discharge status: Home / Self Care | Payer: Self-pay | Source: Ambulatory Visit | Attending: Cardiovascular Disease | Admitting: Cardiovascular Disease

## 2011-12-15 ENCOUNTER — Encounter (HOSPITAL_COMMUNITY)
Admission: RE | Admit: 2011-12-15 | Discharge: 2011-12-15 | Disposition: A | Discharge status: Home / Self Care | Payer: Self-pay | Source: Ambulatory Visit | Attending: Cardiovascular Disease | Admitting: Cardiovascular Disease

## 2011-12-17 ENCOUNTER — Encounter (HOSPITAL_COMMUNITY)
Admission: RE | Admit: 2011-12-17 | Discharge: 2011-12-17 | Disposition: A | Discharge status: Home / Self Care | Payer: Self-pay | Source: Ambulatory Visit | Attending: Cardiovascular Disease | Admitting: Cardiovascular Disease

## 2011-12-20 ENCOUNTER — Encounter (HOSPITAL_COMMUNITY): Payer: Self-pay

## 2011-12-22 ENCOUNTER — Encounter (HOSPITAL_COMMUNITY)
Admission: RE | Admit: 2011-12-22 | Discharge: 2011-12-22 | Disposition: A | Discharge status: Home / Self Care | Payer: Self-pay | Source: Ambulatory Visit | Attending: Cardiovascular Disease | Admitting: Cardiovascular Disease

## 2011-12-24 ENCOUNTER — Encounter (HOSPITAL_COMMUNITY)
Admission: RE | Admit: 2011-12-24 | Discharge: 2011-12-24 | Disposition: A | Discharge status: Home / Self Care | Payer: Self-pay | Source: Ambulatory Visit | Attending: Cardiovascular Disease | Admitting: Cardiovascular Disease

## 2011-12-27 ENCOUNTER — Encounter (HOSPITAL_COMMUNITY)
Admission: RE | Admit: 2011-12-27 | Discharge: 2011-12-27 | Disposition: A | Discharge status: Home / Self Care | Payer: Self-pay | Source: Ambulatory Visit | Attending: Cardiovascular Disease | Admitting: Cardiovascular Disease

## 2011-12-27 DIAGNOSIS — I498 Other specified cardiac arrhythmias: Secondary | ICD-10-CM | POA: Insufficient documentation

## 2011-12-27 DIAGNOSIS — I2510 Atherosclerotic heart disease of native coronary artery without angina pectoris: Secondary | ICD-10-CM | POA: Insufficient documentation

## 2011-12-27 DIAGNOSIS — F341 Dysthymic disorder: Secondary | ICD-10-CM | POA: Insufficient documentation

## 2011-12-27 DIAGNOSIS — E785 Hyperlipidemia, unspecified: Secondary | ICD-10-CM | POA: Insufficient documentation

## 2011-12-27 DIAGNOSIS — Z951 Presence of aortocoronary bypass graft: Secondary | ICD-10-CM | POA: Insufficient documentation

## 2011-12-27 DIAGNOSIS — Q8500 Neurofibromatosis, unspecified: Secondary | ICD-10-CM | POA: Insufficient documentation

## 2011-12-27 DIAGNOSIS — I10 Essential (primary) hypertension: Secondary | ICD-10-CM | POA: Insufficient documentation

## 2011-12-27 DIAGNOSIS — Z79899 Other long term (current) drug therapy: Secondary | ICD-10-CM | POA: Insufficient documentation

## 2011-12-27 DIAGNOSIS — Z5189 Encounter for other specified aftercare: Secondary | ICD-10-CM | POA: Insufficient documentation

## 2011-12-27 DIAGNOSIS — I252 Old myocardial infarction: Secondary | ICD-10-CM | POA: Insufficient documentation

## 2011-12-27 DIAGNOSIS — I319 Disease of pericardium, unspecified: Secondary | ICD-10-CM | POA: Insufficient documentation

## 2011-12-27 DIAGNOSIS — E109 Type 1 diabetes mellitus without complications: Secondary | ICD-10-CM | POA: Insufficient documentation

## 2011-12-27 DIAGNOSIS — Z794 Long term (current) use of insulin: Secondary | ICD-10-CM | POA: Insufficient documentation

## 2011-12-27 DIAGNOSIS — Z9641 Presence of insulin pump (external) (internal): Secondary | ICD-10-CM | POA: Insufficient documentation

## 2011-12-27 DIAGNOSIS — G4733 Obstructive sleep apnea (adult) (pediatric): Secondary | ICD-10-CM | POA: Insufficient documentation

## 2011-12-29 ENCOUNTER — Encounter (HOSPITAL_COMMUNITY)
Admission: RE | Admit: 2011-12-29 | Discharge: 2011-12-29 | Disposition: A | Discharge status: Home / Self Care | Payer: Self-pay | Source: Ambulatory Visit | Attending: Cardiovascular Disease | Admitting: Cardiovascular Disease

## 2011-12-29 NOTE — Progress Notes (Signed)
Patient's post-exercise CBG was 64, juice and banana given. Recheck CBG was 75. Pt remained asymptomatic. Pt ate a granola bar that he brought with him and was encouraged to eat that since his last meal was at 0705. Recheck CBG was 131 after second snack. Pt's pre-exercise CBG was 205. He has an MD appt today at 1400.

## 2011-12-31 ENCOUNTER — Encounter (HOSPITAL_COMMUNITY)
Admission: RE | Admit: 2011-12-31 | Discharge: 2011-12-31 | Disposition: A | Discharge status: Home / Self Care | Payer: Self-pay | Source: Ambulatory Visit | Attending: Cardiovascular Disease | Admitting: Cardiovascular Disease

## 2012-01-03 ENCOUNTER — Encounter (HOSPITAL_COMMUNITY)
Admission: RE | Admit: 2012-01-03 | Discharge: 2012-01-03 | Disposition: A | Discharge status: Home / Self Care | Payer: Self-pay | Source: Ambulatory Visit | Attending: Cardiovascular Disease | Admitting: Cardiovascular Disease

## 2012-01-05 ENCOUNTER — Encounter (HOSPITAL_COMMUNITY): Payer: Self-pay

## 2012-01-07 ENCOUNTER — Encounter (HOSPITAL_COMMUNITY)
Admission: RE | Admit: 2012-01-07 | Discharge: 2012-01-07 | Disposition: A | Discharge status: Home / Self Care | Payer: Self-pay | Source: Ambulatory Visit | Attending: Cardiovascular Disease | Admitting: Cardiovascular Disease

## 2012-01-10 ENCOUNTER — Encounter (HOSPITAL_COMMUNITY)
Admission: RE | Admit: 2012-01-10 | Discharge: 2012-01-10 | Disposition: A | Discharge status: Home / Self Care | Payer: Self-pay | Source: Ambulatory Visit | Attending: Cardiovascular Disease | Admitting: Cardiovascular Disease

## 2012-01-12 ENCOUNTER — Encounter (HOSPITAL_COMMUNITY)
Admission: RE | Admit: 2012-01-12 | Discharge: 2012-01-12 | Disposition: A | Discharge status: Home / Self Care | Payer: Self-pay | Source: Ambulatory Visit | Attending: Cardiovascular Disease | Admitting: Cardiovascular Disease

## 2012-01-14 ENCOUNTER — Encounter (HOSPITAL_COMMUNITY)
Admission: RE | Admit: 2012-01-14 | Discharge: 2012-01-14 | Disposition: A | Discharge status: Home / Self Care | Payer: Self-pay | Source: Ambulatory Visit | Attending: Cardiovascular Disease | Admitting: Cardiovascular Disease

## 2012-01-17 ENCOUNTER — Ambulatory Visit (INDEPENDENT_AMBULATORY_CARE_PROVIDER_SITE_OTHER): Payer: BC Managed Care – PPO | Admitting: Cardiovascular Disease

## 2012-01-17 ENCOUNTER — Encounter (HOSPITAL_COMMUNITY)
Admission: RE | Admit: 2012-01-17 | Discharge: 2012-01-17 | Disposition: A | Discharge status: Home / Self Care | Payer: Self-pay | Source: Ambulatory Visit | Attending: Cardiovascular Disease | Admitting: Cardiovascular Disease

## 2012-01-17 ENCOUNTER — Encounter: Payer: Self-pay | Admitting: Cardiovascular Disease

## 2012-01-17 VITALS — BP 117/71 | HR 80 | Ht 71.0 in | Wt 232.0 lb

## 2012-01-17 DIAGNOSIS — I25810 Atherosclerosis of coronary artery bypass graft(s) without angina pectoris: Secondary | ICD-10-CM

## 2012-01-17 DIAGNOSIS — R079 Chest pain, unspecified: Secondary | ICD-10-CM

## 2012-01-17 NOTE — Progress Notes (Signed)
HPI:  46 year-old gentleman presenting for followup evaluation. The patient has diffuse coronary disease with history of CABG in 2011 with early graft failure and subsequent multivessel PCI. He continues to complain of chronic fatigue. He reports poor energy. He denies chest pain or exertional dyspnea. He's had no edema, orthopnea, PND, or palpitations. He is very compliant with his medical program as well as his exercise program. He remains involved with the maintenance phase of cardiac rehabilitation.  Outpatient Encounter Prescriptions as of 01/17/2012  Medication Sig Dispense Refill  . acetaminophen (TYLENOL) 500 MG tablet Take 1,000 mg by mouth every 6 (six) hours as needed. As needed for pain.       [name] aspirin 81 MG tablet Take 81 mg by mouth daily.        . benzonatate (TESSALON) 100 MG capsule Take 100 mg by mouth 3 (three) times daily as needed. As needed for cough.      . carbamazepine (TEGRETOL XR) 200 MG 12 hr tablet Take 600 mg by mouth 2 (two) times daily.       [name] docusate sodium (COLACE) 100 MG capsule Take 100 mg by mouth daily.        [name] EFFIENT 10 MG TABS take 1 tablet by mouth once daily  30 tablet  6  . ezetimibe (ZETIA) 10 MG tablet Take 10 mg by mouth at bedtime.       [name] guaiFENesin (MUCINEX) 600 MG 12 hr tablet Take 600-1,200 mg by mouth 2 (two) times daily. As needed for congestion.      . insulin aspart (NOVOLOG) 100 UNIT/ML injection Inject into the skin. Insulin pump. Use as directed      . levETIRAcetam (KEPPRA) 500 MG tablet Take 250-500 mg by mouth 2 (two) times daily. Take 1/2 tablet in the morning and 1 tablet in the evening      . lisinopril (PRINIVIL,ZESTRIL) 10 MG tablet Take 5 mg by mouth daily.       . metoprolol tartrate (LOPRESSOR) 25 MG tablet take 1 tablet by mouth twice a day  60 tablet  2  . multivitamin (ONE-A-DAY MEN'S) TABS Take 1 tablet by mouth daily.       . nitroGLYCERIN (NITROSTAT) 0.4 MG SL tablet Place 1 tablet (0.4 mg total) under the tongue  every 5 (five) minutes as needed.  25 tablet  3  . NON FORMULARY Oxygen at 2 liters/min at night       . rosuvastatin (CRESTOR) 20 MG tablet Take 20 mg by mouth at bedtime.       . sertraline (ZOLOFT) 100 MG tablet Take 150 mg by mouth at bedtime. One and half tablet at bedtime      . vitamin B-12 (CYANOCOBALAMIN) 500 MCG tablet Take 500 mcg by mouth daily.        Allergies  Allergen Reactions  . Latex Rash    Past Medical History  Diagnosis Date  . CAD (coronary artery disease)     s/p CABG, NSTEMI  . Diabetes mellitus type I     on insulin pump at  home  . Sleep apnea     s/p oral surgery  . Hyperlipidemia   . 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 Diabetic retinopathy     s/p vitrectomy and history of retinal surgery  . HTN (hypertension)   . Seizure disorder   . Seasonal allergic rhinitis   . Depression  ROS: Negative except as per HPI  BP 117/71  Pulse 80  Ht 5\' 11"  (1.803 m)  Wt 105.235 kg (232 lb)  BMI 32.36 kg/m2  PHYSICAL EXAM: Pt is alert and oriented, NAD HEENT: normal Neck: JVP - normal, carotids 2+= without bruits Lungs: CTA bilaterally CV: RRR without murmur or gallop Abd: soft, NT, Positive BS, no hepatomegaly Ext: no C/C/E, distal pulses intact and equal Skin: warm/dry no rash  EKG: Normal sinus rhythm 76 beats per minute, possible left atrial enlargement, mild inferior ST segment elevation unchanged from previous tracings.  ASSESSMENT AND PLAN:

## 2012-01-19 ENCOUNTER — Encounter (HOSPITAL_COMMUNITY)
Admission: RE | Admit: 2012-01-19 | Discharge: 2012-01-19 | Disposition: A | Discharge status: Home / Self Care | Payer: Self-pay | Source: Ambulatory Visit | Attending: Cardiovascular Disease | Admitting: Cardiovascular Disease

## 2012-01-21 ENCOUNTER — Encounter (HOSPITAL_COMMUNITY)
Admission: RE | Admit: 2012-01-21 | Discharge: 2012-01-21 | Disposition: A | Discharge status: Home / Self Care | Payer: Self-pay | Source: Ambulatory Visit | Attending: Cardiovascular Disease | Admitting: Cardiovascular Disease

## 2012-01-23 NOTE — Assessment & Plan Note (Signed)
The patient remains stable without typical symptoms of angina. He certainly is at risk for silent ischemia with his long-standing type 1 diabetes. However, he had a nuclear study as recently as December 2012 that showed no evidence of ischemia. His exercise tolerance was poor. I think it would be reasonable to repeat a non-imaging exercise treadmill test to rule out progressive coronary disease as a cause of his fatigue. I reviewed again with him that I think his fatigue is related to the combination of his medical problems and also potentially related to medication side effects. I'm not confident that any single intervention will make a significant impact and he understands this. His medications were reviewed and he remains on appropriate therapy for his coronary artery disease.

## 2012-01-24 ENCOUNTER — Encounter (HOSPITAL_COMMUNITY)
Admission: RE | Admit: 2012-01-24 | Discharge: 2012-01-24 | Disposition: A | Discharge status: Home / Self Care | Payer: Self-pay | Source: Ambulatory Visit | Attending: Cardiovascular Disease | Admitting: Cardiovascular Disease

## 2012-01-25 ENCOUNTER — Other Ambulatory Visit: Payer: Self-pay | Admitting: Cardiovascular Disease

## 2012-01-26 ENCOUNTER — Encounter (HOSPITAL_COMMUNITY)
Admission: RE | Admit: 2012-01-26 | Discharge: 2012-01-26 | Disposition: A | Discharge status: Home / Self Care | Payer: Self-pay | Source: Ambulatory Visit | Attending: Cardiovascular Disease | Admitting: Cardiovascular Disease

## 2012-01-28 ENCOUNTER — Encounter (HOSPITAL_COMMUNITY)
Admission: RE | Admit: 2012-01-28 | Discharge: 2012-01-28 | Disposition: A | Discharge status: Home / Self Care | Payer: Self-pay | Source: Ambulatory Visit | Attending: Cardiovascular Disease | Admitting: Cardiovascular Disease

## 2012-01-28 DIAGNOSIS — E109 Type 1 diabetes mellitus without complications: Secondary | ICD-10-CM | POA: Insufficient documentation

## 2012-01-28 DIAGNOSIS — Q8500 Neurofibromatosis, unspecified: Secondary | ICD-10-CM | POA: Insufficient documentation

## 2012-01-28 DIAGNOSIS — G4733 Obstructive sleep apnea (adult) (pediatric): Secondary | ICD-10-CM | POA: Insufficient documentation

## 2012-01-28 DIAGNOSIS — Z9641 Presence of insulin pump (external) (internal): Secondary | ICD-10-CM | POA: Insufficient documentation

## 2012-01-28 DIAGNOSIS — I10 Essential (primary) hypertension: Secondary | ICD-10-CM | POA: Insufficient documentation

## 2012-01-28 DIAGNOSIS — Z5189 Encounter for other specified aftercare: Secondary | ICD-10-CM | POA: Insufficient documentation

## 2012-01-28 DIAGNOSIS — I498 Other specified cardiac arrhythmias: Secondary | ICD-10-CM | POA: Insufficient documentation

## 2012-01-28 DIAGNOSIS — Z951 Presence of aortocoronary bypass graft: Secondary | ICD-10-CM | POA: Insufficient documentation

## 2012-01-28 DIAGNOSIS — I319 Disease of pericardium, unspecified: Secondary | ICD-10-CM | POA: Insufficient documentation

## 2012-01-28 DIAGNOSIS — E785 Hyperlipidemia, unspecified: Secondary | ICD-10-CM | POA: Insufficient documentation

## 2012-01-28 DIAGNOSIS — Z794 Long term (current) use of insulin: Secondary | ICD-10-CM | POA: Insufficient documentation

## 2012-01-28 DIAGNOSIS — I252 Old myocardial infarction: Secondary | ICD-10-CM | POA: Insufficient documentation

## 2012-01-28 DIAGNOSIS — Z79899 Other long term (current) drug therapy: Secondary | ICD-10-CM | POA: Insufficient documentation

## 2012-01-28 DIAGNOSIS — I2510 Atherosclerotic heart disease of native coronary artery without angina pectoris: Secondary | ICD-10-CM | POA: Insufficient documentation

## 2012-01-28 DIAGNOSIS — F341 Dysthymic disorder: Secondary | ICD-10-CM | POA: Insufficient documentation

## 2012-01-31 ENCOUNTER — Encounter (HOSPITAL_COMMUNITY)
Admission: RE | Admit: 2012-01-31 | Discharge: 2012-01-31 | Disposition: A | Discharge status: Home / Self Care | Payer: Self-pay | Source: Ambulatory Visit | Attending: Cardiovascular Disease | Admitting: Cardiovascular Disease

## 2012-02-02 ENCOUNTER — Encounter (HOSPITAL_COMMUNITY)
Admission: RE | Admit: 2012-02-02 | Discharge: 2012-02-02 | Disposition: A | Discharge status: Home / Self Care | Payer: Self-pay | Source: Ambulatory Visit | Attending: Cardiovascular Disease | Admitting: Cardiovascular Disease

## 2012-02-04 ENCOUNTER — Encounter (HOSPITAL_COMMUNITY)
Admission: RE | Admit: 2012-02-04 | Discharge: 2012-02-04 | Disposition: A | Discharge status: Home / Self Care | Payer: Self-pay | Source: Ambulatory Visit | Attending: Cardiovascular Disease | Admitting: Cardiovascular Disease

## 2012-02-07 ENCOUNTER — Encounter (HOSPITAL_COMMUNITY)
Admission: RE | Admit: 2012-02-07 | Discharge: 2012-02-07 | Disposition: A | Discharge status: Home / Self Care | Payer: Self-pay | Source: Ambulatory Visit | Attending: Cardiovascular Disease | Admitting: Cardiovascular Disease

## 2012-02-09 ENCOUNTER — Encounter (HOSPITAL_COMMUNITY)
Admission: RE | Admit: 2012-02-09 | Discharge: 2012-02-09 | Disposition: A | Discharge status: Home / Self Care | Payer: Self-pay | Source: Ambulatory Visit | Attending: Cardiovascular Disease | Admitting: Cardiovascular Disease

## 2012-02-11 ENCOUNTER — Encounter (HOSPITAL_COMMUNITY)
Admission: RE | Admit: 2012-02-11 | Discharge: 2012-02-11 | Disposition: A | Discharge status: Home / Self Care | Payer: Self-pay | Source: Ambulatory Visit | Attending: Cardiovascular Disease | Admitting: Cardiovascular Disease

## 2012-02-14 ENCOUNTER — Encounter (HOSPITAL_COMMUNITY)
Admission: RE | Admit: 2012-02-14 | Discharge: 2012-02-14 | Disposition: A | Discharge status: Home / Self Care | Payer: Self-pay | Source: Ambulatory Visit | Attending: Cardiovascular Disease | Admitting: Cardiovascular Disease

## 2012-02-16 ENCOUNTER — Encounter (HOSPITAL_COMMUNITY)
Admission: RE | Admit: 2012-02-16 | Discharge: 2012-02-16 | Disposition: A | Discharge status: Home / Self Care | Payer: Self-pay | Source: Ambulatory Visit | Attending: Cardiovascular Disease | Admitting: Cardiovascular Disease

## 2012-02-18 ENCOUNTER — Encounter (HOSPITAL_COMMUNITY)
Admission: RE | Admit: 2012-02-18 | Discharge: 2012-02-18 | Disposition: A | Discharge status: Home / Self Care | Payer: Self-pay | Source: Ambulatory Visit | Attending: Cardiovascular Disease | Admitting: Cardiovascular Disease

## 2012-02-21 ENCOUNTER — Encounter (HOSPITAL_COMMUNITY)
Admission: RE | Admit: 2012-02-21 | Discharge: 2012-02-21 | Disposition: A | Discharge status: Home / Self Care | Payer: Self-pay | Source: Ambulatory Visit | Attending: Cardiovascular Disease | Admitting: Cardiovascular Disease

## 2012-02-23 ENCOUNTER — Encounter (HOSPITAL_COMMUNITY)
Admission: RE | Admit: 2012-02-23 | Discharge: 2012-02-23 | Disposition: A | Discharge status: Home / Self Care | Payer: Self-pay | Source: Ambulatory Visit | Attending: Cardiovascular Disease | Admitting: Cardiovascular Disease

## 2012-02-24 ENCOUNTER — Encounter (HOSPITAL_COMMUNITY)
Admission: RE | Admit: 2012-02-24 | Discharge: 2012-02-24 | Disposition: A | Discharge status: Home / Self Care | Payer: Self-pay | Source: Ambulatory Visit | Attending: Cardiovascular Disease | Admitting: Cardiovascular Disease

## 2012-02-25 ENCOUNTER — Ambulatory Visit (INDEPENDENT_AMBULATORY_CARE_PROVIDER_SITE_OTHER): Payer: BC Managed Care – PPO | Admitting: Cardiovascular Disease

## 2012-02-25 ENCOUNTER — Encounter: Payer: Self-pay | Admitting: Cardiovascular Disease

## 2012-02-25 ENCOUNTER — Encounter (HOSPITAL_COMMUNITY): Payer: Self-pay

## 2012-02-25 ENCOUNTER — Inpatient Hospital Stay (HOSPITAL_COMMUNITY)
Admission: EM | Admit: 2012-02-25 | Discharge: 2012-02-26 | DRG: 313 | Disposition: A | Discharge status: Home / Self Care | Payer: Medicare Other | Attending: Cardiology | Admitting: Cardiology

## 2012-02-25 ENCOUNTER — Encounter (HOSPITAL_COMMUNITY): Payer: Self-pay | Admitting: Emergency Medicine

## 2012-02-25 ENCOUNTER — Emergency Department (HOSPITAL_COMMUNITY): Payer: Medicare Other

## 2012-02-25 DIAGNOSIS — F3289 Other specified depressive episodes: Secondary | ICD-10-CM | POA: Diagnosis present

## 2012-02-25 DIAGNOSIS — Z8719 Personal history of other diseases of the digestive system: Secondary | ICD-10-CM

## 2012-02-25 DIAGNOSIS — F329 Major depressive disorder, single episode, unspecified: Secondary | ICD-10-CM | POA: Diagnosis present

## 2012-02-25 DIAGNOSIS — G40909 Epilepsy, unspecified, not intractable, without status epilepticus: Secondary | ICD-10-CM | POA: Diagnosis present

## 2012-02-25 DIAGNOSIS — I25810 Atherosclerosis of coronary artery bypass graft(s) without angina pectoris: Secondary | ICD-10-CM | POA: Diagnosis present

## 2012-02-25 DIAGNOSIS — E1039 Type 1 diabetes mellitus with other diabetic ophthalmic complication: Secondary | ICD-10-CM | POA: Diagnosis present

## 2012-02-25 DIAGNOSIS — R079 Chest pain, unspecified: Principal | ICD-10-CM | POA: Diagnosis present

## 2012-02-25 DIAGNOSIS — M79609 Pain in unspecified limb: Secondary | ICD-10-CM | POA: Diagnosis present

## 2012-02-25 DIAGNOSIS — Z7982 Long term (current) use of aspirin: Secondary | ICD-10-CM

## 2012-02-25 DIAGNOSIS — Z91040 Latex allergy status: Secondary | ICD-10-CM

## 2012-02-25 DIAGNOSIS — E11319 Type 2 diabetes mellitus with unspecified diabetic retinopathy without macular edema: Secondary | ICD-10-CM | POA: Diagnosis present

## 2012-02-25 DIAGNOSIS — Z9861 Coronary angioplasty status: Secondary | ICD-10-CM

## 2012-02-25 DIAGNOSIS — E782 Mixed hyperlipidemia: Secondary | ICD-10-CM | POA: Diagnosis present

## 2012-02-25 DIAGNOSIS — Z794 Long term (current) use of insulin: Secondary | ICD-10-CM

## 2012-02-25 DIAGNOSIS — Z8249 Family history of ischemic heart disease and other diseases of the circulatory system: Secondary | ICD-10-CM

## 2012-02-25 DIAGNOSIS — I10 Essential (primary) hypertension: Secondary | ICD-10-CM | POA: Diagnosis present

## 2012-02-25 DIAGNOSIS — Q8500 Neurofibromatosis, unspecified: Secondary | ICD-10-CM | POA: Diagnosis present

## 2012-02-25 DIAGNOSIS — J309 Allergic rhinitis, unspecified: Secondary | ICD-10-CM | POA: Diagnosis present

## 2012-02-25 DIAGNOSIS — Z9089 Acquired absence of other organs: Secondary | ICD-10-CM

## 2012-02-25 HISTORY — DX: Unspecified hemorrhoids: K64.9

## 2012-02-25 HISTORY — DX: Unspecified cataract: H26.9

## 2012-02-25 LAB — BASIC METABOLIC PANEL
BUN: 11 mg/dL (ref 6–23)
CO2: 30 mEq/L (ref 19–32)
Chloride: 102 mEq/L (ref 96–112)
Creatinine, Ser: 0.77 mg/dL (ref 0.50–1.35)
GFR calc Af Amer: >90 mL/min (ref >90)
Glucose, Bld: 70 mg/dL (ref 70–99)
Potassium: 4.1 mEq/L (ref 3.5–5.1)

## 2012-02-25 LAB — GLUCOSE, CAPILLARY: Glucose-Capillary: 69 mg/dL — ABNORMAL LOW (ref 70–99)

## 2012-02-25 LAB — CBC
HCT: 42 % (ref 39.0–52.0)
Hemoglobin: 14.5 g/dL (ref 13.0–17.0)
MCV: 88.4 fL (ref 78.0–100.0)
RBC: 4.75 MIL/uL (ref 4.22–5.81)
WBC: 5 10*3/uL (ref 4.0–10.5)

## 2012-02-25 MED ORDER — ISOSORBIDE MONONITRATE ER 30 MG PO TB24: 30.0000 mg | ORAL_TABLET | Freq: Every day | ORAL | Status: DC

## 2012-02-25 NOTE — Procedures (Signed)
Exercise Treadmill Test  Pre-Exercise Testing Evaluation Rhythm: normal sinus  Rate: 84   PR:  .16 QRS:  .09  QT:  .35 QTc: .42     Test  Exercise Tolerance Test Ordering MD: Tonny Bollman, MD  Interpreting MD: Tonny Bollman, MD  Unique Test No: 1  Treadmill:  1  Indication for ETT: known ASHD  Contraindication to ETT: No   Stress Modality: exercise - treadmill  Cardiac Imaging Performed: non   Protocol: standard Bruce - maximal  Max BP:  210/73  Max MPHR (bpm):  172 85% MPR (bpm):  146  MPHR obtained (bpm):  146 % MPHR obtained:  85%  Reached 85% MPHR (min:sec):  7:20 Total Exercise Time (min-sec):  7:26  Workload in METS:  7.1 Borg Scale: 15  Reason ETT Terminated:  dyspnea    ST Segment Analysis At Rest: normal ST segments - no evidence of significant ST depression With Exercise: borderline ST changes  Other Information Arrhythmia:  No Angina during ETT:  present (1) Quality of ETT:  diagnostic  ETT Interpretation:  abnormal - evidence of ST depression consistent with ischemia  Comments: 1. Abnormal stress treadmill study with borderline ST depression, hypertensive response to exercise, and exertional angina.  2. Fair exercise tolerance.  3. Rapid resolution of borderline ST changes with recovery  Recommendations: Abnormal but not high-risk treadmill test. Recommend increase antianginal program and follow-up clinically in 3 months.

## 2012-02-25 NOTE — Addendum Note (Signed)
Addended by: Waymon Budge on: 02/25/2012 04:39 PM   Modules accepted: Orders

## 2012-02-25 NOTE — ED Provider Notes (Signed)
History     CSN: [id number]  Arrival date & time 02/25/12  4098   First MD Initiated Contact with Patient 02/25/12 2109      Chief Complaint  Patient presents with  . Chest Pain    (Consider location/radiation/quality/duration/timing/severity/associated sxs/prior treatment) Patient is a 46 y.o. male presenting with chest pain. The history is provided by the patient and the spouse. No language interpreter was used.  Chest Pain The chest pain began 3 - 5 hours ago. Chest pain occurs constantly. The chest pain is unchanged. At its most intense, the pain is at 4/10. The pain is currently at 0/10. The severity of the pain is mild. The quality of the pain is described as dull and pressure-like. The pain does not radiate. Pertinent negatives for primary symptoms include no fever, no shortness of breath, no cough, no wheezing, no palpitations, no abdominal pain, no nausea, no vomiting and no dizziness. He tried nitroglycerin for the symptoms.  His past medical history is significant for CAD, diabetes, hyperlipidemia and hypertension.   46yo male with c/o midsternal chest pressure dull ache since his stress test today at [facility].  States that after the stress test he did have chest pain 4/10 with some SOB and Dr. Excell Seltzer gave him a ntg with no relief.  Patient then went home after the pain went down to a 2/10 and the pain became worse so he took another ntg with no relief. He then took one more nitroglycerin with some relief.  Patient reports that the stress test was normal today.  pmh of cabg and stents in his mid and distal LAD.  States that the pain is 1/10 presently and a pressure like pain.  Patient has a past medical history of CABG, diabetes, sleep apnea, hypertension, hyperlipidemia, seizures, depression.   Past Medical History  Diagnosis Date  . CAD (coronary artery disease)     s/p CABG, NSTEMI  . Diabetes mellitus type I     on insulin pump at  home  . Sleep apnea     s/p  oral surgery  . Hyperlipidemia   . 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 Diabetic retinopathy     s/p vitrectomy and history of retinal surgery  . HTN (hypertension)   . Seizure disorder   . Seasonal allergic rhinitis   . Depression     Past Surgical History  Procedure Date  . Coronary artery bypass graft   . Uvulopalatoplasty surgery   . Tonsillectomy   . Release of right transverse carpal ligament   . Right eye vitrectomy and detached retina repair 10/2007  . Laparoscopic appendectomy 11/24/2011    Procedure: APPENDECTOMY LAPAROSCOPIC;  Surgeon: Cherylynn Ridges, MD;  Location: [facility] OR;  Service: General;  Laterality: N/A;  Lap. Appy.    Family History  Problem Relation Age of Onset  . Coronary artery disease Father   . Diabetes Father     type II  . Hypertension Father   . Squamous cell carcinoma Brother   . Arthritis Sister     History  Substance Use Topics  . Smoking status: Never Smoker   . Smokeless tobacco: Never Used  . Alcohol Use: No      Review of Systems  Constitutional: Negative.  Negative for fever.  HENT: Negative.   Eyes: Negative.   Respiratory: Positive for chest tightness. Negative for cough, shortness of breath and wheezing.   Cardiovascular: Positive for  chest pain. Negative for palpitations.  Gastrointestinal: Negative.  Negative for nausea, vomiting and abdominal pain.  Neurological: Negative.  Negative for dizziness.  Psychiatric/Behavioral: Negative.   All other systems reviewed and are negative.    Allergies  Latex  Home Medications   Current Outpatient Rx  Name Route Sig Dispense Refill  . ACETAMINOPHEN 500 MG PO TABS Oral Take 1,000 mg by mouth every 6 (six) hours as needed. As needed for pain.     . ASPIRIN 81 MG PO TABS Oral Take 81 mg by mouth daily.      [name] CARBAMAZEPINE ER 200 MG PO TB12 Oral Take 600 mg by mouth 2 (two) times daily.     [name] DOCUSATE SODIUM 100 MG PO CAPS Oral Take 100 mg by  mouth daily.      [name] EZETIMIBE 10 MG PO TABS Oral Take 10 mg by mouth at bedtime.     . INSULIN PUMP Subcutaneous Inject into the skin continuous. Novolog Insulin Basal Rate as follows: 12am-1.1 units, 6am-1.9 units, 10am-2.0 units, 12pm-2.2 units & 6pm-2.1 units    . ISOSORBIDE MONONITRATE ER 30 MG PO TB24 Oral Take 30 mg by mouth daily.    [name] LEVETIRACETAM 500 MG PO TABS Oral Take 250-500 mg by mouth 2 (two) times daily. Take 1/2 tablet in the morning and 1 tablet in the evening    . LISINOPRIL 10 MG PO TABS Oral Take 5 mg by mouth daily.     [name] METOPROLOL TARTRATE 25 MG PO TABS Oral Take 25 mg by mouth 2 (two) times daily.    . ONE-A-DAY MENS PO TABS Oral Take 1 tablet by mouth daily.     [name] NITROGLYCERIN 0.4 MG SL SUBL Sublingual Place 0.4 mg under the tongue every 5 (five) minutes as needed. For chest pain    . PRASUGREL HCL 10 MG PO TABS Oral Take 10 mg by mouth daily.    [name] ROSUVASTATIN CALCIUM 20 MG PO TABS Oral Take 20 mg by mouth at bedtime.     . SERTRALINE HCL 100 MG PO TABS Oral Take 150 mg by mouth at bedtime. One and half tablet at bedtime    . VITAMIN B-12 500 MCG PO TABS Oral Take 500 mcg by mouth daily.      BP 119/65  Pulse 79  Temp 98.3 F (36.8 C) (Oral)  Resp 20  SpO2 96%  Physical Exam  Nursing note and vitals reviewed. Constitutional: He is oriented to person, place, and time. He appears well-developed and well-nourished.  HENT:  Head: Normocephalic.  Eyes: Conjunctivae and EOM are normal. Pupils are equal, round, and reactive to light.  Neck: Normal range of motion. Neck supple.  Cardiovascular: Normal rate, regular rhythm and normal heart sounds.   No murmur heard. Pulmonary/Chest: Effort normal and breath sounds normal. No respiratory distress. He has no wheezes.  Abdominal: Soft. Bowel sounds are normal. He exhibits no distension. There is no tenderness.  Musculoskeletal: Normal range of motion.  Neurological: He is alert and oriented to person, place, and  time.  Skin: Skin is warm and dry.  Psychiatric: He has a normal mood and affect.    ED Course  Procedures (including critical care time)  Labs Reviewed  GLUCOSE, CAPILLARY - Abnormal; Notable for the following:    Glucose-Capillary 69 (*)     All other components within normal limits  CBC  BASIC METABOLIC PANEL  POCT I-STAT TROPONIN I  GLUCOSE, CAPILLARY   Dg Chest 2  View  02/25/2012  *RADIOLOGY REPORT*  Clinical Data: Chest pain.  History of prior CABG.  CHEST - 2 VIEW  Comparison: 11/24/2011  Findings: The lungs are clear.  No edema, infiltrate, nodule or pleural effusion identified.  Heart size is stable and within normal limits with evidence of prior CABG.  The bony structures are unremarkable.  IMPRESSION: No active disease.   Original Report Authenticated By: Reola Calkins, M.D.      1. Chest pain       MDM  46yo male with chest pressure 4/10 post stress test today at [facility]. Dr. Excell Seltzer aware he was having chest pain on discharge.  Patient went home and continued to have its chest pressure 2-3/10 and took  2 more nitroglycerin.  Past medical history of open heart surgery and 2 stents in his LAD. Dr. Edwyna Shell who will come in and see the patient and admit. First troponin negative EKG unchanged other labs unremarkable.   Date: 02/25/2012  Rate: 89  Rhythm: normal sinus rhythm  QRS Axis: normal  Intervals: normal  ST/T Wave abnormalities: normal  Conduction Disutrbances:none  Narrative Interpretation:   Old EKG Reviewed: unchanged  Labs Reviewed  GLUCOSE, CAPILLARY - Abnormal; Notable for the following:    Glucose-Capillary 69 (*)     All other components within normal limits  CBC  BASIC METABOLIC PANEL  POCT I-STAT TROPONIN I  GLUCOSE, CAPILLARY         Remi Haggard, NP 02/25/12 2328

## 2012-02-25 NOTE — H&P (Signed)
CARDIOLOGY ADMISSION NOTE  Patient ID: Jeff [name] MRN: [id number] DOB/AGE: May 11, 1966 46 y.o.  Admit date: 02/25/2012 Primary Physician   Ezequiel Kayser, MD Primary Cardiologist   Dr. Calton Dach Chief Complaint    Chest pain.    HPI: The patient has a history of CAD s/p CABG.  He had a POET (Plain Old Exercise Treadmill) today by Dr. Excell Seltzer.  This demonstrated borderline ST depression, hypertensive response to exercise, and exertional angina. There was rapid resolution of borderline ST changes with recovery.  It was not felt to be a high-risk treadmill test. Dr. Excell Seltzer recommended increase antianginal program and follow-up clinically in 3 months.   The patient did take 1 troponin in the office some improvement in his discomfort. He took another on the way home and was still having some discomfort at home and took a third. Because he continues to have some mild discomfort he was advised by our nurse practitioner to present to the emergency room. So far troponin x 1 was negative.  EKG was non acute.   His pain is similar to previous angina. At peak it was 3/10 in intensity and currently is 1/10. He had some mild diaphoresis but no nausea. He didn't have any jaw or arm discomfort. A stress test was done because of some mild increased shortness of breath. He's had no PND or orthopnea.   Past Medical History  Diagnosis Date  . CAD (coronary artery disease)     Cath 2011.  LAD stent patent, distal LAD occlusion, D1 100%, OM branch 80 - 90%, SVG to diag patent, LIMA to LAD occluded, SVG to RCA occluded, SVG to OM was occluded.   No change from previous cath.  Managed medically.   . Diabetes mellitus type I     on insulin pump at  home  . Sleep apnea     s/p oral surgery  . Hyperlipidemia   . 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 Diabetic retinopathy     s/p vitrectomy and history of retinal surgery  . HTN (hypertension)   . Seizure disorder   .  Seasonal allergic rhinitis   . Depression     Past Surgical History  Procedure Date  . Coronary artery bypass graft   . Uvulopalatoplasty surgery   . Tonsillectomy   . Release of right transverse carpal ligament   . Right eye vitrectomy and detached retina repair 10/2007  . Laparoscopic appendectomy 11/24/2011    Allergies  Allergen Reactions  . Latex Rash   No current facility-administered medications on file prior to encounter.   Current Outpatient Prescriptions on File Prior to Encounter  Medication Sig Dispense Refill  . acetaminophen (TYLENOL) 500 MG tablet Take 1,000 mg by mouth every 6 (six) hours as needed. As needed for pain.       [name] aspirin 81 MG tablet Take 81 mg by mouth daily.        . carbamazepine (TEGRETOL XR) 200 MG 12 hr tablet Take 600 mg by mouth 2 (two) times daily.       [name] docusate sodium (COLACE) 100 MG capsule Take 100 mg by mouth daily.        [name] ezetimibe (ZETIA) 10 MG tablet Take 10 mg by mouth at bedtime.       . Insulin Human (INSULIN PUMP) 100 unit/ml SOLN Inject into the skin continuous. Novolog Insulin Basal Rate as follows: 12am-1.1 units, 6am-1.9 units, 10am-2.0  units, 12pm-2.2 units & 6pm-2.1 units      . levETIRAcetam (KEPPRA) 500 MG tablet Take 250-500 mg by mouth 2 (two) times daily. Take 1/2 tablet in the morning and 1 tablet in the evening      . lisinopril (PRINIVIL,ZESTRIL) 10 MG tablet Take 5 mg by mouth daily.       . multivitamin (ONE-A-DAY MEN'S) TABS Take 1 tablet by mouth daily.       . rosuvastatin (CRESTOR) 20 MG tablet Take 20 mg by mouth at bedtime.       . sertraline (ZOLOFT) 100 MG tablet Take 150 mg by mouth at bedtime. One and half tablet at bedtime      . vitamin B-12 (CYANOCOBALAMIN) 500 MCG tablet Take 500 mcg by mouth daily.      [name] DISCONTD: isosorbide mononitrate (IMDUR) 30 MG 24 hr tablet Take 1 tablet (30 mg total) by mouth daily.  90 tablet  3  . DISCONTD: nitroGLYCERIN (NITROSTAT) 0.4 MG SL tablet Place 1 tablet (0.4 mg  total) under the tongue every 5 (five) minutes as needed.  25 tablet  3   History   Social History  . Marital Status: Married    Spouse Name: Almyra Free    Number of Children: 0  . Years of Education: N/A   Occupational History  . Unemployed     previously worked in [employer]   Social History Main Topics  . Smoking status: Never Smoker   . Smokeless tobacco: Never Used  . Alcohol Use: No  . Drug Use: No  . Sexually Active: Not on file   Other Topics Concern  . Not on file   Social History Narrative  . No narrative on file    Family History  Problem Relation Age of Onset  . Coronary artery disease Father   . Diabetes Father     type II  . Hypertension Father   . Squamous cell carcinoma Brother   . Arthritis Sister     ROS:  As stated in the HPI and negative for all other systems.  Physical Exam: Blood pressure 119/65, pulse 79, temperature 98.3 F (36.8 C), temperature source Oral, resp. rate 20, SpO2 96.00%.  GENERAL:  Well appearing, looks older than stated age.  HEENT:  Pupils equal round and reactive, fundi not visualized, oral mucosa unremarkable NECK:  No jugular venous distention, waveform within normal limits, carotid upstroke brisk and symmetric, soft right bruit, no thyromegaly LYMPHATICS:  No cervical, inguinal adenopathy LUNGS:  Clear to auscultation bilaterally BACK:  No CVA tenderness CHEST:  Well healed sternotomy scar. HEART:  PMI not displaced or sustained,S1 and S2 within normal limits, no S3, no S4, no clicks, no rubs, no murmurs ABD:  Flat, positive bowel sounds normal in frequency in pitch, no bruits, no rebound, no guarding, no midline pulsatile mass, no hepatomegaly, no splenomegaly, obese, insulin pump EXT:  2 plus pulses upper, diminished bilateral DP/PT, no edema, no cyanosis no clubbing SKIN:  No rashes no nodules NEURO:  Cranial nerves II through XII grossly intact, motor grossly intact throughout PSYCH:  Cognitively intact, oriented to person  place and time  Labs: Lab Results  Component Value Date   BUN 11 02/25/2012   Lab Results  Component Value Date   CREATININE 0.77 02/25/2012   Lab Results  Component Value Date   NA 140 02/25/2012   K 4.1 02/25/2012   CL 102 02/25/2012   CO2 30 02/25/2012    Lab Results  Component Value Date   WBC 5.0 02/25/2012   HGB 14.5 02/25/2012   HCT 42.0 02/25/2012   MCV 88.4 02/25/2012   PLT 181 02/25/2012     Radiology:  CXR:  No active disease.  EKG:  Sinus rhythm rate 89, RAD, early transition V1.  No acute ST T wave changes.  02/25/2012  ASSESSMENT AND PLAN:    Chest pain -  The patient has multiple reasons to have angina with his coronary disease. This was brought on by the more than usual exertion of his treadmill test. At this point I would observe him overnight and cycle enzymes. I will use topical nitrates and continue his other medications. However, he is pain-free and has no significant enzyme elevation or EKG change in the morning I would not suggest invasive evaluation. I have discussed this with the patient.  Diabetes - I will continue his insulin pump  Bruit - I notice slight right bruit and will defer to Dr. Excell Seltzer ordering carotid Dopplers.  SignedRollene Rotunda 02/25/2012, 10:57 PM

## 2012-02-25 NOTE — ED Notes (Signed)
Pt reports having stress today earlier this afternoon with Dr Excell Seltzer (told was normal study)--when got off treadmill--had some midsternal CP; took one nitro at that time; when checked out--still was having cp, got home--took another nitro; took 3rd nitro about 15 minutes ago, reports 3rd one helped with pain--reports pain is not constant anymore, now intermittent; describes pain as tightness and some burning; denies sob,n/v, diaphoresis

## 2012-02-25 NOTE — ED Notes (Signed)
Unsuccessful attempt at IV.  IV team notified

## 2012-02-25 NOTE — ED Notes (Signed)
Patient presents after having a stress test today and had continued chest pain.  Told the office personal that he was hurting before he left but the MD told him he thought it may be angina.  Took NTG at the office, took another one at home but the pain continued.  Tried a 3rd NTG but his pain continued off and on so he decided to come to the ED to be seen.

## 2012-02-26 DIAGNOSIS — R079 Chest pain, unspecified: Principal | ICD-10-CM

## 2012-02-26 LAB — CBC WITH DIFFERENTIAL/PLATELET
Basophils Relative: 0 % (ref 0–1)
Eosinophils Absolute: 0.2 10*3/uL (ref 0.0–0.7)
Eosinophils Relative: 3 % (ref 0–5)
Hemoglobin: 14.6 g/dL (ref 13.0–17.0)
Lymphs Abs: 1.1 10*3/uL (ref 0.7–4.0)
MCH: 30.8 pg (ref 26.0–34.0)
MCHC: 35 g/dL (ref 30.0–36.0)
MCV: 88 fL (ref 78.0–100.0)
Monocytes Absolute: 0.7 10*3/uL (ref 0.1–1.0)
Monocytes Relative: 14 % — ABNORMAL HIGH (ref 3–12)
Neutrophils Relative %: 62 % (ref 43–77)

## 2012-02-26 LAB — GLUCOSE, CAPILLARY
Glucose-Capillary: 114 mg/dL — ABNORMAL HIGH (ref 70–99)
Glucose-Capillary: 114 mg/dL — ABNORMAL HIGH (ref 70–99)

## 2012-02-26 LAB — COMPREHENSIVE METABOLIC PANEL
BUN: 12 mg/dL (ref 6–23)
CO2: 28 mEq/L (ref 19–32)
Calcium: 9.5 mg/dL (ref 8.4–10.5)
Creatinine, Ser: 0.66 mg/dL (ref 0.50–1.35)
GFR calc Af Amer: >90 mL/min (ref >90)
GFR calc non Af Amer: >90 mL/min (ref >90)
Glucose, Bld: 128 mg/dL — ABNORMAL HIGH (ref 70–99)
Sodium: 140 mEq/L (ref 135–145)
Total Protein: 6.9 g/dL (ref 6.0–8.3)

## 2012-02-26 LAB — TROPONIN I: Troponin I: <0.3 ng/mL (ref <0.30)

## 2012-02-26 MED ORDER — PRASUGREL HCL 10 MG PO TABS
10.0000 mg | ORAL_TABLET | Freq: Every day | ORAL | Status: DC
  Administered 2012-02-26: 10 mg via ORAL
  Filled 2012-02-26: qty 1

## 2012-02-26 MED ORDER — ASPIRIN 300 MG RE SUPP: 300.0000 mg | RECTAL | Status: DC

## 2012-02-26 MED ORDER — ACETAMINOPHEN 325 MG PO TABS: 650.0000 mg | ORAL_TABLET | ORAL | Status: DC | PRN

## 2012-02-26 MED ORDER — INSULIN PUMP
SUBCUTANEOUS | Status: DC
  Filled 2012-02-26: qty 1

## 2012-02-26 MED ORDER — ONDANSETRON HCL 4 MG/2ML IJ SOLN: 4.0000 mg | Freq: Four times a day (QID) | INTRAMUSCULAR | Status: DC | PRN

## 2012-02-26 MED ORDER — ASPIRIN 81 MG PO TABS: 81.0000 mg | ORAL_TABLET | Freq: Every day | ORAL | Status: DC

## 2012-02-26 MED ORDER — EZETIMIBE 10 MG PO TABS
10.0000 mg | ORAL_TABLET | Freq: Every day | ORAL | Status: DC
  Administered 2012-02-26: 10 mg via ORAL
  Filled 2012-02-26 (×2): qty 1

## 2012-02-26 MED ORDER — ISOSORBIDE MONONITRATE ER 30 MG PO TB24: 30.0000 mg | ORAL_TABLET | Freq: Every day | ORAL | Status: DC

## 2012-02-26 MED ORDER — SERTRALINE HCL 50 MG PO TABS
150.0000 mg | ORAL_TABLET | Freq: Every day | ORAL | Status: DC
  Administered 2012-02-26: 150 mg via ORAL
  Filled 2012-02-26 (×2): qty 1

## 2012-02-26 MED ORDER — LEVETIRACETAM 250 MG PO TABS: 250.0000 mg | ORAL_TABLET | Freq: Two times a day (BID) | ORAL | Status: DC

## 2012-02-26 MED ORDER — CARBAMAZEPINE ER 400 MG PO TB12
600.0000 mg | ORAL_TABLET | Freq: Two times a day (BID) | ORAL | Status: DC
  Administered 2012-02-26 (×2): 600 mg via ORAL
  Filled 2012-02-26 (×3): qty 1

## 2012-02-26 MED ORDER — LEVETIRACETAM 500 MG PO TABS
500.0000 mg | ORAL_TABLET | Freq: Every day | ORAL | Status: DC
  Administered 2012-02-26: 500 mg via ORAL
  Filled 2012-02-26 (×2): qty 1

## 2012-02-26 MED ORDER — ADULT MULTIVITAMIN W/MINERALS CH
1.0000 | ORAL_TABLET | Freq: Every day | ORAL | Status: DC
  Administered 2012-02-26: 1 via ORAL
  Filled 2012-02-26: qty 1

## 2012-02-26 MED ORDER — ISOSORBIDE MONONITRATE ER 30 MG PO TB24: 60.0000 mg | ORAL_TABLET | Freq: Every day | ORAL | Status: DC

## 2012-02-26 MED ORDER — ASPIRIN EC 81 MG PO TBEC
81.0000 mg | DELAYED_RELEASE_TABLET | Freq: Every day | ORAL | Status: DC
  Administered 2012-02-26: 81 mg via ORAL
  Filled 2012-02-26: qty 1

## 2012-02-26 MED ORDER — CYANOCOBALAMIN 500 MCG PO TABS
500.0000 ug | ORAL_TABLET | Freq: Every day | ORAL | Status: DC
  Administered 2012-02-26: 500 ug via ORAL
  Filled 2012-02-26: qty 1

## 2012-02-26 MED ORDER — ATORVASTATIN CALCIUM 10 MG PO TABS
10.0000 mg | ORAL_TABLET | Freq: Every day | ORAL | Status: DC
  Filled 2012-02-26: qty 1

## 2012-02-26 MED ORDER — NITROGLYCERIN 0.4 MG SL SUBL: 0.4000 mg | SUBLINGUAL_TABLET | SUBLINGUAL | Status: DC | PRN

## 2012-02-26 MED ORDER — ACETAMINOPHEN 500 MG PO TABS: 1000.0000 mg | ORAL_TABLET | Freq: Four times a day (QID) | ORAL | Status: DC | PRN

## 2012-02-26 MED ORDER — LISINOPRIL 5 MG PO TABS
5.0000 mg | ORAL_TABLET | Freq: Every day | ORAL | Status: DC
  Administered 2012-02-26: 5 mg via ORAL
  Filled 2012-02-26: qty 1

## 2012-02-26 MED ORDER — LEVETIRACETAM 250 MG PO TABS
250.0000 mg | ORAL_TABLET | Freq: Every day | ORAL | Status: DC
  Administered 2012-02-26: 250 mg via ORAL
  Filled 2012-02-26: qty 1

## 2012-02-26 MED ORDER — ISOSORBIDE MONONITRATE ER 30 MG PO TB24
30.0000 mg | ORAL_TABLET | Freq: Every day | ORAL | Status: DC
  Administered 2012-02-26: 30 mg via ORAL
  Filled 2012-02-26: qty 1

## 2012-02-26 MED ORDER — METOPROLOL TARTRATE 25 MG PO TABS
25.0000 mg | ORAL_TABLET | Freq: Two times a day (BID) | ORAL | Status: DC
  Administered 2012-02-26 (×2): 25 mg via ORAL
  Filled 2012-02-26 (×3): qty 1

## 2012-02-26 MED ORDER — ASPIRIN 81 MG PO CHEW: 324.0000 mg | CHEWABLE_TABLET | ORAL | Status: DC

## 2012-02-26 MED ORDER — DOCUSATE SODIUM 100 MG PO CAPS
100.0000 mg | ORAL_CAPSULE | Freq: Every day | ORAL | Status: DC
  Administered 2012-02-26: 100 mg via ORAL
  Filled 2012-02-26: qty 1

## 2012-02-26 NOTE — ED Provider Notes (Signed)
Medical screening examination/treatment/procedure(s) were performed by non-physician practitioner and as supervising physician I was immediately available for consultation/collaboration.  Tobin Chad, MD 02/26/12 0100

## 2012-02-26 NOTE — Discharge Summary (Signed)
Physician Discharge Summary  Patient ID: Jeff [name] MRN: [id number] DOB/AGE: May 19, 1966 46 y.o.  Admit date: 02/25/2012 Discharge date: 02/26/2012  Primary Cardiologist: Calton Dach, MD   Primary Discharge Diagnosis:  1 Chest Pain  - negative cardiac markers  - Borderline GXT EKG changes (office, preadmission)  Secondary Discharge Diagnoses: Past Medical History  Diagnosis Date  . CAD (coronary artery disease)     Cath 2011.  LAD stent patent, distal LAD occlusion, D1 100%, OM branch 80 - 90%, SVG to diag patent, LIMA to LAD occluded, SVG to RCA occluded, SVG to OM was occluded.   No change from previous cath.  Managed medically.   . Diabetes mellitus type I     on insulin pump at  home  . Sleep apnea     s/p oral surgery  . Hyperlipidemia   . 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 Diabetic retinopathy     s/p vitrectomy and history of retinal surgery  . HTN (hypertension)   . Seizure disorder   . Seasonal allergic rhinitis   . Depression   . Cataract   . Hemorrhoid     Reason for Admission: A 46 year old male, known CAD, status post CABG, admitted for further evaluation of chest pain worrisome for unstable angina pectoris, following a same day GXT with borderline ST segment depression.  Procedures: None  Hospital Course: Patient ruled out for MI with normal serial cardiac markers. Serial EKGs indicated no significant changes. Patient had no further chest pain the following morning, and was cleared for discharge, by Dr. Andee Lineman. Final recommendations were to initiate recently prescribed Imdur, as per Dr. Excell Seltzer, but to be increased to 60 mg daily. Of note, regarding the carotid bruit, Dr. Andee Lineman documented no bruit found on exam, and suspected that this was secondary to a venous hum.  Discharge Vitals: Blood pressure 117/66, pulse 70, temperature 98.3 F (36.8 C), temperature source Oral, resp. rate 24, height 5\' 11"  (1.803 m), weight  230 lb (104.327 kg), SpO2 95.00%.  Labs: Lab Results  Component Value Date   WBC 5.2 02/26/2012   HGB 14.6 02/26/2012   HCT 41.7 02/26/2012   MCV 88.0 02/26/2012   PLT 182 02/26/2012      Lab 02/26/12 0034  NA 140  K 4.1  CL 103  CO2 28  BUN 12  CREATININE 0.66  CALCIUM 9.5  ALBUMIN 4.0  PROT 6.9  BILITOT 0.2*  ALKPHOS 87  ALT 32  AST 23  GLUCOSE 128*    Lab Results  Component Value Date   CHOL 138 06/03/2011   HDL 42 06/03/2011   LDLCALC 74 06/03/2011   TRIG 111 06/03/2011    Lab Results  Component Value Date   DDIMER  Value: <0.22        AT THE INHOUSE ESTABLISHED CUTOFF VALUE OF 0.48 ug/mL FEU, THIS ASSAY HAS BEEN DOCUMENTED IN THE LITERATURE TO HAVE A SENSITIVITY AND NEGATIVE PREDICTIVE VALUE OF AT LEAST 98 TO 99%.  THE TEST RESULT SHOULD BE CORRELATED WITH AN ASSESSMENT OF THE CLINICAL PROBABILITY OF DVT / VTE. 10/12/2009    Lab Results  Component Value Date   TSH 0.319* 06/02/2011     Basename 02/26/12 0510 02/26/12 0034  CKTOTAL -- --  CKMB -- --  TROPONINI <0.30 <0.30    Diagnostic Studies: Dg Chest 2 View  02/25/2012  *RADIOLOGY REPORT*  Clinical Data: Chest pain.  History of prior CABG.  CHEST - 2 VIEW  Comparison: 11/24/2011  Findings: The lungs are clear.  No edema, infiltrate, nodule or pleural effusion identified.  Heart size is stable and within normal limits with evidence of prior CABG.  The bony structures are unremarkable.  IMPRESSION: No active disease.   Original Report Authenticated By: Reola Calkins, M.D.      DISPOSITION: Stable condition  FOLLOW UP PLANS AND APPOINTMENTS: Discharge Orders    Future Appointments: Provider: Department: Dept Phone: Center:   02/28/2012 11:15 AM [facility] [phone] None   03/01/2012 11:15 AM [facility] [phone] None   03/03/2012 11:15 AM [facility] [phone] None   03/06/2012 11:15 AM [facility] [phone] None   03/08/2012 11:15 AM [facility] [phone] None   03/10/2012 11:15 AM [facility] [phone] None   03/13/2012 11:15 AM [facility] [phone] None   03/15/2012 11:15 AM [facility] [phone] None   03/17/2012 11:15 AM [facility] [phone] None   03/20/2012 11:15 AM [facility] [phone] None   03/22/2012 11:15 AM [facility] [phone] None   03/24/2012 11:15 AM [facility] [phone] None   03/27/2012 11:15 AM [facility] [phone] None   03/29/2012 11:15 AM [facility] [phone] None   03/31/2012 11:15 AM [facility] [phone] None   04/03/2012 11:15 AM [facility] [phone] None   04/05/2012 11:15 AM [facility] [phone] None   04/07/2012 11:15 AM [facility] [phone] None   04/10/2012 11:15 AM [facility] [phone] None   04/12/2012 11:15 AM [facility] [phone] None   04/14/2012 11:15 AM [facility] [phone] None   04/17/2012 11:15 AM [facility] [phone] None   04/19/2012 11:15 AM [facility] [phone] None   04/20/2012 3:15 PM Tonny Bollman, MD [facility] [phone] [id number]   04/21/2012 11:15 AM [facility] [phone] None   04/24/2012 11:15 AM [facility]  [phone] None   04/26/2012 11:15 AM [facility] [phone] None   04/28/2012 11:15 AM [facility] [phone] None   05/01/2012 11:15 AM [facility] [phone] None   05/03/2012 11:15 AM [facility] [phone] None   05/05/2012 11:15 AM [facility] [phone] None   05/08/2012 11:15 AM [facility] [phone] None   05/10/2012 11:15 AM [facility] [phone] None   05/12/2012 11:15 AM [facility] [phone] None   05/15/2012 11:15 AM [facility] [phone] None   05/17/2012 11:15 AM [facility] [phone] None   05/19/2012 11:15 AM [facility] [phone] None   05/22/2012 11:15 AM [facility] [phone] None   05/24/2012 11:15 AM [facility] [phone] None   05/26/2012 11:15 AM [facility] [phone] None       DISCHARGE MEDICATIONS: Medication List  As of 02/26/2012  9:20 AM   ASK your doctor about these medications         acetaminophen  500 MG tablet   Commonly known as: TYLENOL   Take 1,000 mg by mouth every 6 (six) hours as needed. As needed for pain.      aspirin 81 MG tablet   Take 81 mg by mouth daily.      carbamazepine 200 MG 12 hr tablet   Commonly known as: TEGRETOL XR   Take 600 mg by mouth 2 (two) times daily.      docusate sodium 100 MG capsule   Commonly known as: COLACE   Take 100 mg by mouth daily.      ezetimibe 10 MG tablet   Commonly known as: ZETIA   Take 10 mg by mouth at bedtime.      insulin pump 100 unit/ml Soln   Inject into the skin continuous. Novolog  Insulin  Basal Rate as follows: 12am-1.1 units, 6am-1.9 units, 10am-2.0 units, 12pm-2.2 units & 6pm-2.1 units      isosorbide mononitrate 30 MG 24 hr tablet   Commonly known as: IMDUR   Take 30 mg by mouth daily.      levETIRAcetam 500 MG tablet   Commonly known as: KEPPRA   Take 250-500 mg by mouth 2 (two) times daily. Take 1/2 tablet in the morning and 1 tablet in the evening      lisinopril 10 MG tablet   Commonly known as: PRINIVIL,ZESTRIL   Take 5 mg by mouth daily.      metoprolol tartrate 25 MG tablet   Commonly known as: LOPRESSOR   Take 25 mg by mouth 2 (two) times daily.      multivitamin Tabs   Take 1 tablet by mouth daily.      nitroGLYCERIN 0.4 MG SL tablet   Commonly known as: NITROSTAT   Place 0.4 mg under the tongue every 5 (five) minutes as needed. For chest pain      prasugrel 10 MG Tabs   Commonly known as: EFFIENT   Take 10 mg by mouth daily.      rosuvastatin 20 MG tablet   Commonly known as: CRESTOR   Take 20 mg by mouth at bedtime.      vitamin B-12 500 MCG tablet   Commonly known as: CYANOCOBALAMIN   Take 500 mcg by mouth daily.      ZOLOFT 100 MG tablet   Generic drug: sertraline   Take 150 mg by mouth at bedtime. One and half tablet at bedtime            BRING ALL MEDICATIONS WITH YOU TO FOLLOW UP APPOINTMENTS  Time spent with patient to include physician time: Greater than 30 minutes, including physician time.  SignedShara Blazing, Aidyn Kellis 02/26/2012, 9:20 AM Co-Sign MD

## 2012-02-26 NOTE — Progress Notes (Signed)
Jeff Bottoms, MD, [facility] ABIM Board Certified in Adult Cardiovascular Medicine,Internal Medicine and Critical Care Medicine      Subjective:    No scp, feeling better, no sob. R/O MI    Objective:   Weight Range:  Vital Signs:   Temp:  [98.3 F (36.8 C)-98.4 F (36.9 C)] 98.3 F (36.8 C) (08/31 0500) Pulse Rate:  [69-90] 70  (08/31 0500) Resp:  [14-24] 24  (08/31 0003) BP: (117-136)/(48-103) 117/66 mmHg (08/31 0500) SpO2:  [95 %-100 %] 95 % (08/31 0500) Weight:  [230 lb (104.327 kg)] 230 lb (104.327 kg) (08/31 0100) Last BM Date: 02/25/12  Weight change: Filed Weights   02/26/12 0100  Weight: 230 lb (104.327 kg)    Intake/Output:  No intake or output data in the 24 hours ending 02/26/12 0813   Physical Exam: Genereal- well nourished Neck no carotid bruits Lungs clear Heart : RRR no murmur Abd soft, non tender Ext assymetric swelling, minimal Neuro alert, non focal    Telemetry: NSR Labs: Basic Metabolic Panel:  Lab 02/26/12 1610 02/25/12 2111  NA 140 140  K 4.1 4.1  CL 103 102  CO2 28 30  GLUCOSE 128* 70  BUN 12 11  CREATININE 0.66 0.77  CALCIUM 9.5 9.4  MG -- --  PHOS -- --    Liver Function Tests:  Lab 02/26/12 0034  AST 23  ALT 32  ALKPHOS 87  BILITOT 0.2*  PROT 6.9  ALBUMIN 4.0   No results found for this basename: LIPASE:5,AMYLASE:5 in the last 168 hours No results found for this basename: AMMONIA:3 in the last 168 hours  CBC:  Lab 02/26/12 0034 02/25/12 2111  WBC 5.2 5.0  NEUTROABS 3.2 --  HGB 14.6 14.5  HCT 41.7 42.0  MCV 88.0 88.4  PLT 182 181    Cardiac Enzymes:  Lab 02/26/12 0510 02/26/12 0034  CKTOTAL -- --  CKMB -- --  CKMBINDEX -- --  TROPONINI <0.30 <0.30     BNP: BNP (last 3 results) No results found for this basename: PROBNP:3 in the last 8760 hours  ABG    Component Value Date/Time   PHART 7.371 10/16/2009 2043   PCO2ART 39.7 10/16/2009 2043   PO2ART 92.0 10/16/2009 2043   HCO3 22.8 10/16/2009 2043    TCO2 22 04/12/2010 2302   ACIDBASEDEF 2.0 10/16/2009 2043   O2SAT 96.0 10/16/2009 2043     Other results: EKG: NSR Imaging: Dg Chest 2 View  02/25/2012  *RADIOLOGY REPORT*  Clinical Data: Chest pain.  History of prior CABG.  CHEST - 2 VIEW  Comparison: 11/24/2011  Findings: The lungs are clear.  No edema, infiltrate, nodule or pleural effusion identified.  Heart size is stable and within normal limits with evidence of prior CABG.  The bony structures are unremarkable.  IMPRESSION: No active disease.   Original Report Authenticated By: Reola Calkins, M.D.       Medications:     Scheduled Medications:    . aspirin  324 mg Oral NOW   Or  . aspirin  300 mg Rectal NOW  . aspirin EC  81 mg Oral Daily  . atorvastatin  10 mg Oral q1800  . carbamazepine  600 mg Oral BID  . cyanocobalamin  500 mcg Oral Daily  . docusate sodium  100 mg Oral Daily  . ezetimibe  10 mg Oral QHS  . isosorbide mononitrate  30 mg Oral Daily  . levETIRAcetam  250 mg Oral Daily  .  levETIRAcetam  500 mg Oral QHS  . lisinopril  5 mg Oral Daily  . metoprolol tartrate  25 mg Oral BID  . multivitamin with minerals  1 tablet Oral Daily  . prasugrel  10 mg Oral Daily  . sertraline  150 mg Oral QHS  . DISCONTD: aspirin  81 mg Oral Daily  . DISCONTD: isosorbide mononitrate  30 mg Oral Daily  . DISCONTD: levETIRAcetam  250-500 mg Oral BID     Infusions:    . insulin pump       PRN Medications:  acetaminophen, acetaminophen, nitroGLYCERIN, ondansetron (ZOFRAN) IV, DISCONTD: nitroGLYCERIN, DISCONTD: nitroGLYCERIN   Assessment:   1. Chest pain   2. Coronary atherosclerosis of autologous vein bypass graft      Patient Active Problem List  Diagnosis  . HYPERLIPIDEMIA-MIXED  . 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 CORONARY ATHEROSLERO AUTOL VEIN BYPASS GRAFT  . Leg pain, left  . Postop check      Plan/Discussion:    No recurrent chest pain, never filled Imdur. Still has  script. Asked him to start as written per Dr. Excell Seltzer.30mg  PO qdaily to increase to 60mg Po qdaily I did not hear a bruit. Suspect venous hum. Don't think patient needs carotid dopplers.  OK for D/C  Length of Stay: 1   Jeff [name] 02/26/2012, 8:13 AM

## 2012-02-28 ENCOUNTER — Encounter (HOSPITAL_COMMUNITY): Payer: Self-pay

## 2012-03-01 ENCOUNTER — Encounter (HOSPITAL_COMMUNITY)
Admission: RE | Admit: 2012-03-01 | Discharge: 2012-03-01 | Disposition: A | Discharge status: Home / Self Care | Payer: Self-pay | Source: Ambulatory Visit | Attending: Cardiovascular Disease | Admitting: Cardiovascular Disease

## 2012-03-01 ENCOUNTER — Telehealth: Payer: Self-pay | Admitting: Cardiovascular Disease

## 2012-03-01 DIAGNOSIS — Q8500 Neurofibromatosis, unspecified: Secondary | ICD-10-CM | POA: Insufficient documentation

## 2012-03-01 DIAGNOSIS — I498 Other specified cardiac arrhythmias: Secondary | ICD-10-CM | POA: Insufficient documentation

## 2012-03-01 DIAGNOSIS — G4733 Obstructive sleep apnea (adult) (pediatric): Secondary | ICD-10-CM | POA: Insufficient documentation

## 2012-03-01 DIAGNOSIS — Z5189 Encounter for other specified aftercare: Secondary | ICD-10-CM | POA: Insufficient documentation

## 2012-03-01 DIAGNOSIS — Z9641 Presence of insulin pump (external) (internal): Secondary | ICD-10-CM | POA: Insufficient documentation

## 2012-03-01 DIAGNOSIS — E785 Hyperlipidemia, unspecified: Secondary | ICD-10-CM | POA: Insufficient documentation

## 2012-03-01 DIAGNOSIS — I319 Disease of pericardium, unspecified: Secondary | ICD-10-CM | POA: Insufficient documentation

## 2012-03-01 DIAGNOSIS — F341 Dysthymic disorder: Secondary | ICD-10-CM | POA: Insufficient documentation

## 2012-03-01 DIAGNOSIS — E109 Type 1 diabetes mellitus without complications: Secondary | ICD-10-CM | POA: Insufficient documentation

## 2012-03-01 DIAGNOSIS — Z79899 Other long term (current) drug therapy: Secondary | ICD-10-CM | POA: Insufficient documentation

## 2012-03-01 DIAGNOSIS — I252 Old myocardial infarction: Secondary | ICD-10-CM | POA: Insufficient documentation

## 2012-03-01 DIAGNOSIS — Z794 Long term (current) use of insulin: Secondary | ICD-10-CM | POA: Insufficient documentation

## 2012-03-01 DIAGNOSIS — Z951 Presence of aortocoronary bypass graft: Secondary | ICD-10-CM | POA: Insufficient documentation

## 2012-03-01 DIAGNOSIS — I2510 Atherosclerotic heart disease of native coronary artery without angina pectoris: Secondary | ICD-10-CM | POA: Insufficient documentation

## 2012-03-01 DIAGNOSIS — I10 Essential (primary) hypertension: Secondary | ICD-10-CM | POA: Insufficient documentation

## 2012-03-01 NOTE — Telephone Encounter (Signed)
Walk In pt Form " Need Written Statement so he can Return to Rehab" Sent to Lauren B 03/01/12/KM

## 2012-03-03 ENCOUNTER — Encounter (HOSPITAL_COMMUNITY)
Admission: RE | Admit: 2012-03-03 | Discharge: 2012-03-03 | Disposition: A | Discharge status: Home / Self Care | Payer: Self-pay | Source: Ambulatory Visit | Attending: Cardiovascular Disease | Admitting: Cardiovascular Disease

## 2012-03-03 NOTE — Discharge Summary (Signed)
Agree with plan as outlined.

## 2012-03-06 ENCOUNTER — Encounter (HOSPITAL_COMMUNITY)
Admission: RE | Admit: 2012-03-06 | Discharge: 2012-03-06 | Disposition: A | Discharge status: Home / Self Care | Payer: Self-pay | Source: Ambulatory Visit | Attending: Cardiovascular Disease | Admitting: Cardiovascular Disease

## 2012-03-08 ENCOUNTER — Encounter (HOSPITAL_COMMUNITY)
Admission: RE | Admit: 2012-03-08 | Discharge: 2012-03-08 | Disposition: A | Discharge status: Home / Self Care | Payer: Self-pay | Source: Ambulatory Visit | Attending: Cardiovascular Disease | Admitting: Cardiovascular Disease

## 2012-03-09 ENCOUNTER — Encounter: Payer: BC Managed Care – PPO | Admitting: Cardiovascular Disease

## 2012-03-10 ENCOUNTER — Encounter (HOSPITAL_COMMUNITY)
Admission: RE | Admit: 2012-03-10 | Discharge: 2012-03-10 | Disposition: A | Discharge status: Home / Self Care | Payer: Self-pay | Source: Ambulatory Visit | Attending: Cardiovascular Disease | Admitting: Cardiovascular Disease

## 2012-03-13 ENCOUNTER — Encounter (HOSPITAL_COMMUNITY)
Admission: RE | Admit: 2012-03-13 | Discharge: 2012-03-13 | Disposition: A | Discharge status: Home / Self Care | Payer: Self-pay | Source: Ambulatory Visit | Attending: Cardiovascular Disease | Admitting: Cardiovascular Disease

## 2012-03-15 ENCOUNTER — Encounter (HOSPITAL_COMMUNITY)
Admission: RE | Admit: 2012-03-15 | Discharge: 2012-03-15 | Disposition: A | Discharge status: Home / Self Care | Payer: Self-pay | Source: Ambulatory Visit | Attending: Cardiovascular Disease | Admitting: Cardiovascular Disease

## 2012-03-17 ENCOUNTER — Encounter (HOSPITAL_COMMUNITY)
Admission: RE | Admit: 2012-03-17 | Discharge: 2012-03-17 | Disposition: A | Discharge status: Home / Self Care | Payer: Self-pay | Source: Ambulatory Visit | Attending: Cardiovascular Disease | Admitting: Cardiovascular Disease

## 2012-03-20 ENCOUNTER — Encounter (HOSPITAL_COMMUNITY)
Admission: RE | Admit: 2012-03-20 | Discharge: 2012-03-20 | Disposition: A | Discharge status: Home / Self Care | Payer: Self-pay | Source: Ambulatory Visit | Attending: Cardiovascular Disease | Admitting: Cardiovascular Disease

## 2012-03-22 ENCOUNTER — Encounter (HOSPITAL_COMMUNITY)
Admission: RE | Admit: 2012-03-22 | Discharge: 2012-03-22 | Disposition: A | Discharge status: Home / Self Care | Payer: Self-pay | Source: Ambulatory Visit | Attending: Cardiovascular Disease | Admitting: Cardiovascular Disease

## 2012-03-24 ENCOUNTER — Encounter (HOSPITAL_COMMUNITY)
Admission: RE | Admit: 2012-03-24 | Discharge: 2012-03-24 | Disposition: A | Discharge status: Home / Self Care | Payer: Self-pay | Source: Ambulatory Visit | Attending: Cardiovascular Disease | Admitting: Cardiovascular Disease

## 2012-03-27 ENCOUNTER — Encounter (HOSPITAL_COMMUNITY)
Admission: RE | Admit: 2012-03-27 | Discharge: 2012-03-27 | Disposition: A | Discharge status: Home / Self Care | Payer: Self-pay | Source: Ambulatory Visit | Attending: Cardiovascular Disease | Admitting: Cardiovascular Disease

## 2012-03-29 ENCOUNTER — Encounter (HOSPITAL_COMMUNITY)
Admission: RE | Admit: 2012-03-29 | Discharge: 2012-03-29 | Disposition: A | Discharge status: Home / Self Care | Payer: Self-pay | Source: Ambulatory Visit | Attending: Cardiovascular Disease | Admitting: Cardiovascular Disease

## 2012-03-29 ENCOUNTER — Encounter (HOSPITAL_COMMUNITY): Payer: Self-pay

## 2012-03-29 DIAGNOSIS — I319 Disease of pericardium, unspecified: Secondary | ICD-10-CM | POA: Insufficient documentation

## 2012-03-29 DIAGNOSIS — G4733 Obstructive sleep apnea (adult) (pediatric): Secondary | ICD-10-CM | POA: Insufficient documentation

## 2012-03-29 DIAGNOSIS — E109 Type 1 diabetes mellitus without complications: Secondary | ICD-10-CM | POA: Insufficient documentation

## 2012-03-29 DIAGNOSIS — I10 Essential (primary) hypertension: Secondary | ICD-10-CM | POA: Insufficient documentation

## 2012-03-29 DIAGNOSIS — E785 Hyperlipidemia, unspecified: Secondary | ICD-10-CM | POA: Insufficient documentation

## 2012-03-29 DIAGNOSIS — Z9641 Presence of insulin pump (external) (internal): Secondary | ICD-10-CM | POA: Insufficient documentation

## 2012-03-29 DIAGNOSIS — Z5189 Encounter for other specified aftercare: Secondary | ICD-10-CM | POA: Insufficient documentation

## 2012-03-29 DIAGNOSIS — Z794 Long term (current) use of insulin: Secondary | ICD-10-CM | POA: Insufficient documentation

## 2012-03-29 DIAGNOSIS — I498 Other specified cardiac arrhythmias: Secondary | ICD-10-CM | POA: Insufficient documentation

## 2012-03-29 DIAGNOSIS — Z951 Presence of aortocoronary bypass graft: Secondary | ICD-10-CM | POA: Insufficient documentation

## 2012-03-29 DIAGNOSIS — I2510 Atherosclerotic heart disease of native coronary artery without angina pectoris: Secondary | ICD-10-CM | POA: Insufficient documentation

## 2012-03-29 DIAGNOSIS — Q8500 Neurofibromatosis, unspecified: Secondary | ICD-10-CM | POA: Insufficient documentation

## 2012-03-29 DIAGNOSIS — Z79899 Other long term (current) drug therapy: Secondary | ICD-10-CM | POA: Insufficient documentation

## 2012-03-29 DIAGNOSIS — F341 Dysthymic disorder: Secondary | ICD-10-CM | POA: Insufficient documentation

## 2012-03-29 DIAGNOSIS — I252 Old myocardial infarction: Secondary | ICD-10-CM | POA: Insufficient documentation

## 2012-03-31 ENCOUNTER — Encounter (HOSPITAL_COMMUNITY)
Admission: RE | Admit: 2012-03-31 | Discharge: 2012-03-31 | Disposition: A | Discharge status: Home / Self Care | Payer: Self-pay | Source: Ambulatory Visit | Attending: Cardiovascular Disease | Admitting: Cardiovascular Disease

## 2012-03-31 ENCOUNTER — Encounter (HOSPITAL_COMMUNITY): Payer: Self-pay

## 2012-04-03 ENCOUNTER — Encounter (HOSPITAL_COMMUNITY): Payer: Self-pay

## 2012-04-03 ENCOUNTER — Encounter (HOSPITAL_COMMUNITY)
Admission: RE | Admit: 2012-04-03 | Discharge: 2012-04-03 | Disposition: A | Discharge status: Home / Self Care | Payer: Self-pay | Source: Ambulatory Visit | Attending: Cardiovascular Disease | Admitting: Cardiovascular Disease

## 2012-04-05 ENCOUNTER — Encounter (HOSPITAL_COMMUNITY)
Admission: RE | Admit: 2012-04-05 | Discharge: 2012-04-05 | Disposition: A | Discharge status: Home / Self Care | Payer: Self-pay | Source: Ambulatory Visit | Attending: Cardiovascular Disease | Admitting: Cardiovascular Disease

## 2012-04-05 ENCOUNTER — Encounter (HOSPITAL_COMMUNITY): Payer: Self-pay

## 2012-04-07 ENCOUNTER — Encounter (HOSPITAL_COMMUNITY)
Admission: RE | Admit: 2012-04-07 | Discharge: 2012-04-07 | Disposition: A | Discharge status: Home / Self Care | Payer: Self-pay | Source: Ambulatory Visit | Attending: Cardiovascular Disease | Admitting: Cardiovascular Disease

## 2012-04-07 ENCOUNTER — Encounter (HOSPITAL_COMMUNITY): Payer: Self-pay

## 2012-04-10 ENCOUNTER — Encounter (HOSPITAL_COMMUNITY): Payer: Self-pay

## 2012-04-10 ENCOUNTER — Encounter (HOSPITAL_COMMUNITY)
Admission: RE | Admit: 2012-04-10 | Discharge: 2012-04-10 | Disposition: A | Discharge status: Home / Self Care | Payer: Self-pay | Source: Ambulatory Visit | Attending: Cardiovascular Disease | Admitting: Cardiovascular Disease

## 2012-04-11 ENCOUNTER — Other Ambulatory Visit: Payer: Self-pay | Admitting: Cardiovascular Disease

## 2012-04-12 ENCOUNTER — Encounter (HOSPITAL_COMMUNITY): Payer: Self-pay

## 2012-04-12 ENCOUNTER — Encounter (HOSPITAL_COMMUNITY)
Admission: RE | Admit: 2012-04-12 | Discharge: 2012-04-12 | Disposition: A | Discharge status: Home / Self Care | Payer: Self-pay | Source: Ambulatory Visit | Attending: Cardiovascular Disease | Admitting: Cardiovascular Disease

## 2012-04-14 ENCOUNTER — Encounter (HOSPITAL_COMMUNITY): Payer: Self-pay

## 2012-04-14 ENCOUNTER — Encounter (HOSPITAL_COMMUNITY)
Admission: RE | Admit: 2012-04-14 | Discharge: 2012-04-14 | Disposition: A | Discharge status: Home / Self Care | Payer: Self-pay | Source: Ambulatory Visit | Attending: Cardiovascular Disease | Admitting: Cardiovascular Disease

## 2012-04-17 ENCOUNTER — Encounter (HOSPITAL_COMMUNITY)
Admission: RE | Admit: 2012-04-17 | Discharge: 2012-04-17 | Disposition: A | Discharge status: Home / Self Care | Payer: Self-pay | Source: Ambulatory Visit | Attending: Cardiovascular Disease | Admitting: Cardiovascular Disease

## 2012-04-17 ENCOUNTER — Encounter (HOSPITAL_COMMUNITY): Payer: Self-pay

## 2012-04-19 ENCOUNTER — Encounter (HOSPITAL_COMMUNITY): Payer: Self-pay

## 2012-04-19 ENCOUNTER — Encounter (HOSPITAL_COMMUNITY)
Admission: RE | Admit: 2012-04-19 | Discharge: 2012-04-19 | Disposition: A | Discharge status: Home / Self Care | Payer: Self-pay | Source: Ambulatory Visit | Attending: Cardiovascular Disease | Admitting: Cardiovascular Disease

## 2012-04-20 ENCOUNTER — Ambulatory Visit: Payer: BC Managed Care – PPO | Admitting: Cardiovascular Disease

## 2012-04-21 ENCOUNTER — Encounter (HOSPITAL_COMMUNITY)
Admission: RE | Admit: 2012-04-21 | Discharge: 2012-04-21 | Disposition: A | Discharge status: Home / Self Care | Payer: Self-pay | Source: Ambulatory Visit | Attending: Cardiovascular Disease | Admitting: Cardiovascular Disease

## 2012-04-21 ENCOUNTER — Encounter (HOSPITAL_COMMUNITY): Payer: Self-pay

## 2012-04-24 ENCOUNTER — Encounter: Payer: Self-pay | Admitting: Cardiovascular Disease

## 2012-04-24 ENCOUNTER — Encounter (HOSPITAL_COMMUNITY)
Admission: RE | Admit: 2012-04-24 | Discharge: 2012-04-24 | Disposition: A | Discharge status: Home / Self Care | Payer: Self-pay | Source: Ambulatory Visit | Attending: Cardiovascular Disease | Admitting: Cardiovascular Disease

## 2012-04-24 ENCOUNTER — Ambulatory Visit (INDEPENDENT_AMBULATORY_CARE_PROVIDER_SITE_OTHER): Payer: BC Managed Care – PPO | Admitting: Cardiovascular Disease

## 2012-04-24 ENCOUNTER — Encounter (HOSPITAL_COMMUNITY): Payer: Self-pay

## 2012-04-24 VITALS — BP 118/70 | HR 83 | Ht 71.0 in | Wt 233.0 lb

## 2012-04-24 DIAGNOSIS — I25810 Atherosclerosis of coronary artery bypass graft(s) without angina pectoris: Secondary | ICD-10-CM

## 2012-04-24 NOTE — Progress Notes (Signed)
HPI:  46 year old gentleman presenting for followup evaluation. The patient has extensive CAD as outlined in past notes. He had early graft failure after coronary bypass surgery in 2011 and he then underwent multivessel PCI.  The patient underwent an exercise treadmill study August 30. His treadmill study demonstrated a hypertensive response to exercise with exertional angina present during stage III of the Bruce protocol. He had borderline ST segment depression with rapid resolution in recovery. Overall this was felt to be low risk and medical therapy was escalated with the addition of a long-acting nitrate. After the stress test he had some residual chest pain when returning home and he was observed overnight in the hospital under a rule out MI protocol. His isosorbide was increased to 60 mg at discharge and he has done well in the interim. He denies any intercurrent chest pain or pressure. He has mild dyspnea with exertion and generalized fatigue. He continues to work in the maintenance phase of cardiac rehabilitation.  Outpatient Encounter Prescriptions as of 04/24/2012  Medication Sig Dispense Refill  . aspirin 81 MG tablet Take 81 mg by mouth daily.        . carbamazepine (TEGRETOL XR) 200 MG 12 hr tablet Take 600 mg by mouth 2 (two) times daily.       [name] docusate sodium (COLACE) 100 MG capsule Take 100 mg by mouth daily.        [name] EFFIENT 10 MG TABS take 1 tablet by mouth daily  30 tablet  6  . ezetimibe (ZETIA) 10 MG tablet Take 10 mg by mouth at bedtime.       . Insulin Human (INSULIN PUMP) 100 unit/ml SOLN Inject into the skin continuous. Novolog Insulin Basal Rate as follows: 12am-1.1 units, 6am-1.9 units, 10am-2.0 units, 12pm-2.2 units & 6pm-2.1 units      . isosorbide mononitrate (IMDUR) 60 MG 24 hr tablet Take 60 mg by mouth daily.      [name] levETIRAcetam (KEPPRA) 500 MG tablet Take 250-500 mg by mouth 2 (two) times daily. Take 1/2 tablet in the morning and 1 tablet in the evening      .  lisinopril (PRINIVIL,ZESTRIL) 10 MG tablet Take 5 mg by mouth daily.       . metoprolol tartrate (LOPRESSOR) 25 MG tablet Take 25 mg by mouth 2 (two) times daily.      . multivitamin (ONE-A-DAY MEN'S) TABS Take 1 tablet by mouth daily.       . nitroGLYCERIN (NITROSTAT) 0.4 MG SL tablet Place 1 tablet (0.4 mg total) under the tongue every 5 (five) minutes as needed.  25 tablet  1  . prasugrel (EFFIENT) 10 MG TABS Take 10 mg by mouth daily.      . rosuvastatin (CRESTOR) 20 MG tablet Take 20 mg by mouth at bedtime.       . sertraline (ZOLOFT) 100 MG tablet Take 150 mg by mouth at bedtime. One and half tablet at bedtime      . vitamin B-12 (CYANOCOBALAMIN) 500 MCG tablet Take 500 mcg by mouth daily.      [name] DISCONTD: isosorbide mononitrate (IMDUR) 30 MG 24 hr tablet Take 2 tablets (60 mg total) by mouth daily.  30 tablet  6    Allergies  Allergen Reactions  . Latex Rash    Past Medical History  Diagnosis Date  . CAD (coronary artery disease)     Cath 2011.  LAD stent patent, distal LAD occlusion, D1 100%, OM branch 80 - 90%,  SVG to diag patent, LIMA to LAD occluded, SVG to RCA occluded, SVG to OM was occluded.   No change from previous cath.  Managed medically.   . Diabetes mellitus type I     on insulin pump at  home  . Sleep apnea     s/p oral surgery  . Hyperlipidemia   . 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 Diabetic retinopathy(362.0)     s/p vitrectomy and history of retinal surgery  . HTN (hypertension)   . Seizure disorder   . Seasonal allergic rhinitis   . Depression   . Cataract   . Hemorrhoid    ROS: Negative except as per HPI  BP 118/70  Pulse 83  Ht 5\' 11"  (1.803 m)  Wt 105.688 kg (233 lb)  BMI 32.50 kg/m2  SpO2 95%  PHYSICAL EXAM: Pt is alert and oriented, NAD HEENT: normal Neck: JVP - normal, carotids 2+= without bruits Lungs: CTA bilaterally CV: RRR without murmur or gallop Abd: soft, NT, Positive BS, no  hepatomegaly Ext: Trace edema bilaterally, distal pulses intact and equal Skin: warm/dry no rash  ASSESSMENT AND PLAN: 1. Coronary artery disease status post CABG and multivessel PCI. The patient appears stable at this time he will continue on his current medical program which includes dual antiplatelet therapy with aspirin and effient, lipid-lowering with zetia and Crestor, and anti-anginal treatment with metoprolol and isosorbide. We had a long discussion about his limitations and also his goals with exercise in cardiac rehabilitation. I really applaud his efforts at remain proactive and staining consistent with his exercise program. I think in general he should stick to light/moderate aerobic activity as I would predict he will continue to have angina at higher workloads. He and his wife are in agreement with this plan. I will see him back in 6 months for followup and he will let me know if anginal symptoms recur in the interim.  2. Hypertension. Blood pressure well controlled on current medical program which includes an ACE inhibitor in the setting of his long-standing diabetes.  3. Hyperlipidemia. Followed by Dr. Waynard Edwards.  Tonny Bollman 04/24/2012 9:35 PM

## 2012-04-24 NOTE — Patient Instructions (Signed)
Your physician wants you to follow-up in: 6 MONTHS.  You will receive a reminder letter in the mail two months in advance. If you don't receive a letter, please call our office to schedule the follow-up appointment.  Your physician recommends that you continue on your current medications as directed. Please refer to the Current Medication list given to you today.  

## 2012-04-26 ENCOUNTER — Encounter (HOSPITAL_COMMUNITY)
Admission: RE | Admit: 2012-04-26 | Discharge: 2012-04-26 | Disposition: A | Discharge status: Home / Self Care | Payer: Self-pay | Source: Ambulatory Visit | Attending: Cardiovascular Disease | Admitting: Cardiovascular Disease

## 2012-04-26 ENCOUNTER — Encounter (HOSPITAL_COMMUNITY): Payer: Self-pay

## 2012-04-28 ENCOUNTER — Encounter (HOSPITAL_COMMUNITY)
Admission: RE | Admit: 2012-04-28 | Discharge: 2012-04-28 | Disposition: A | Discharge status: Home / Self Care | Payer: Self-pay | Source: Ambulatory Visit | Attending: Cardiovascular Disease | Admitting: Cardiovascular Disease

## 2012-04-28 ENCOUNTER — Encounter (HOSPITAL_COMMUNITY): Payer: Self-pay

## 2012-04-28 DIAGNOSIS — I252 Old myocardial infarction: Secondary | ICD-10-CM | POA: Insufficient documentation

## 2012-04-28 DIAGNOSIS — E785 Hyperlipidemia, unspecified: Secondary | ICD-10-CM | POA: Insufficient documentation

## 2012-04-28 DIAGNOSIS — Z9641 Presence of insulin pump (external) (internal): Secondary | ICD-10-CM | POA: Insufficient documentation

## 2012-04-28 DIAGNOSIS — Z5189 Encounter for other specified aftercare: Secondary | ICD-10-CM | POA: Insufficient documentation

## 2012-04-28 DIAGNOSIS — Z79899 Other long term (current) drug therapy: Secondary | ICD-10-CM | POA: Insufficient documentation

## 2012-04-28 DIAGNOSIS — I10 Essential (primary) hypertension: Secondary | ICD-10-CM | POA: Insufficient documentation

## 2012-04-28 DIAGNOSIS — G4733 Obstructive sleep apnea (adult) (pediatric): Secondary | ICD-10-CM | POA: Insufficient documentation

## 2012-04-28 DIAGNOSIS — I319 Disease of pericardium, unspecified: Secondary | ICD-10-CM | POA: Insufficient documentation

## 2012-04-28 DIAGNOSIS — E109 Type 1 diabetes mellitus without complications: Secondary | ICD-10-CM | POA: Insufficient documentation

## 2012-04-28 DIAGNOSIS — I2510 Atherosclerotic heart disease of native coronary artery without angina pectoris: Secondary | ICD-10-CM | POA: Insufficient documentation

## 2012-04-28 DIAGNOSIS — F341 Dysthymic disorder: Secondary | ICD-10-CM | POA: Insufficient documentation

## 2012-04-28 DIAGNOSIS — Z794 Long term (current) use of insulin: Secondary | ICD-10-CM | POA: Insufficient documentation

## 2012-04-28 DIAGNOSIS — I498 Other specified cardiac arrhythmias: Secondary | ICD-10-CM | POA: Insufficient documentation

## 2012-04-28 DIAGNOSIS — Q8500 Neurofibromatosis, unspecified: Secondary | ICD-10-CM | POA: Insufficient documentation

## 2012-04-28 DIAGNOSIS — Z951 Presence of aortocoronary bypass graft: Secondary | ICD-10-CM | POA: Insufficient documentation

## 2012-05-01 ENCOUNTER — Encounter (HOSPITAL_COMMUNITY)
Admission: RE | Admit: 2012-05-01 | Discharge: 2012-05-01 | Disposition: A | Discharge status: Home / Self Care | Payer: Self-pay | Source: Ambulatory Visit | Attending: Cardiovascular Disease | Admitting: Cardiovascular Disease

## 2012-05-01 ENCOUNTER — Encounter (HOSPITAL_COMMUNITY): Payer: Self-pay

## 2012-05-03 ENCOUNTER — Encounter (HOSPITAL_COMMUNITY)
Admission: RE | Admit: 2012-05-03 | Discharge: 2012-05-03 | Disposition: A | Discharge status: Home / Self Care | Payer: Self-pay | Source: Ambulatory Visit | Attending: Cardiovascular Disease | Admitting: Cardiovascular Disease

## 2012-05-03 ENCOUNTER — Encounter (HOSPITAL_COMMUNITY): Payer: Self-pay

## 2012-05-05 ENCOUNTER — Encounter (HOSPITAL_COMMUNITY): Payer: Self-pay

## 2012-05-05 ENCOUNTER — Encounter (HOSPITAL_COMMUNITY)
Admission: RE | Admit: 2012-05-05 | Payer: Self-pay | Source: Ambulatory Visit | Attending: Cardiovascular Disease | Admitting: Cardiovascular Disease

## 2012-05-08 ENCOUNTER — Encounter (HOSPITAL_COMMUNITY): Payer: Self-pay

## 2012-05-08 ENCOUNTER — Encounter (HOSPITAL_COMMUNITY)
Admission: RE | Admit: 2012-05-08 | Discharge: 2012-05-08 | Disposition: A | Discharge status: Home / Self Care | Payer: Self-pay | Source: Ambulatory Visit | Attending: Cardiovascular Disease | Admitting: Cardiovascular Disease

## 2012-05-10 ENCOUNTER — Encounter (HOSPITAL_COMMUNITY): Payer: Self-pay

## 2012-05-12 ENCOUNTER — Encounter (HOSPITAL_COMMUNITY)
Admission: RE | Admit: 2012-05-12 | Discharge: 2012-05-12 | Disposition: A | Discharge status: Home / Self Care | Payer: Self-pay | Source: Ambulatory Visit | Attending: Cardiovascular Disease | Admitting: Cardiovascular Disease

## 2012-05-12 ENCOUNTER — Encounter (HOSPITAL_COMMUNITY): Payer: Self-pay

## 2012-05-15 ENCOUNTER — Encounter (HOSPITAL_COMMUNITY): Payer: Self-pay

## 2012-05-15 ENCOUNTER — Encounter (HOSPITAL_COMMUNITY)
Admission: RE | Admit: 2012-05-15 | Discharge: 2012-05-15 | Disposition: A | Discharge status: Home / Self Care | Payer: Self-pay | Source: Ambulatory Visit | Attending: Cardiovascular Disease | Admitting: Cardiovascular Disease

## 2012-05-17 ENCOUNTER — Encounter (HOSPITAL_COMMUNITY): Payer: Self-pay

## 2012-05-17 ENCOUNTER — Encounter (HOSPITAL_COMMUNITY)
Admission: RE | Admit: 2012-05-17 | Discharge: 2012-05-17 | Disposition: A | Discharge status: Home / Self Care | Payer: Self-pay | Source: Ambulatory Visit | Attending: Cardiovascular Disease | Admitting: Cardiovascular Disease

## 2012-05-19 ENCOUNTER — Encounter (HOSPITAL_COMMUNITY)
Admission: RE | Admit: 2012-05-19 | Discharge: 2012-05-19 | Disposition: A | Discharge status: Home / Self Care | Payer: Self-pay | Source: Ambulatory Visit | Attending: Cardiovascular Disease | Admitting: Cardiovascular Disease

## 2012-05-19 ENCOUNTER — Encounter (HOSPITAL_COMMUNITY): Payer: Self-pay

## 2012-05-22 ENCOUNTER — Encounter (HOSPITAL_COMMUNITY): Payer: Self-pay

## 2012-05-22 ENCOUNTER — Encounter (HOSPITAL_COMMUNITY)
Admission: RE | Admit: 2012-05-22 | Discharge: 2012-05-22 | Disposition: A | Discharge status: Home / Self Care | Payer: Self-pay | Source: Ambulatory Visit | Attending: Cardiovascular Disease | Admitting: Cardiovascular Disease

## 2012-05-24 ENCOUNTER — Encounter (HOSPITAL_COMMUNITY): Payer: Self-pay

## 2012-05-24 ENCOUNTER — Encounter (HOSPITAL_COMMUNITY)
Admission: RE | Admit: 2012-05-24 | Discharge: 2012-05-24 | Disposition: A | Discharge status: Home / Self Care | Payer: Self-pay | Source: Ambulatory Visit | Attending: Cardiovascular Disease | Admitting: Cardiovascular Disease

## 2012-05-26 ENCOUNTER — Encounter (HOSPITAL_COMMUNITY): Payer: Self-pay

## 2012-05-29 ENCOUNTER — Encounter (HOSPITAL_COMMUNITY)
Admission: RE | Admit: 2012-05-29 | Discharge: 2012-05-29 | Disposition: A | Discharge status: Home / Self Care | Payer: Self-pay | Source: Ambulatory Visit | Attending: Cardiovascular Disease | Admitting: Cardiovascular Disease

## 2012-05-29 DIAGNOSIS — I2510 Atherosclerotic heart disease of native coronary artery without angina pectoris: Secondary | ICD-10-CM | POA: Insufficient documentation

## 2012-05-29 DIAGNOSIS — Z79899 Other long term (current) drug therapy: Secondary | ICD-10-CM | POA: Insufficient documentation

## 2012-05-29 DIAGNOSIS — F341 Dysthymic disorder: Secondary | ICD-10-CM | POA: Insufficient documentation

## 2012-05-29 DIAGNOSIS — I252 Old myocardial infarction: Secondary | ICD-10-CM | POA: Insufficient documentation

## 2012-05-29 DIAGNOSIS — Z951 Presence of aortocoronary bypass graft: Secondary | ICD-10-CM | POA: Insufficient documentation

## 2012-05-29 DIAGNOSIS — I498 Other specified cardiac arrhythmias: Secondary | ICD-10-CM | POA: Insufficient documentation

## 2012-05-29 DIAGNOSIS — Z794 Long term (current) use of insulin: Secondary | ICD-10-CM | POA: Insufficient documentation

## 2012-05-29 DIAGNOSIS — Z5189 Encounter for other specified aftercare: Secondary | ICD-10-CM | POA: Insufficient documentation

## 2012-05-29 DIAGNOSIS — I10 Essential (primary) hypertension: Secondary | ICD-10-CM | POA: Insufficient documentation

## 2012-05-29 DIAGNOSIS — G4733 Obstructive sleep apnea (adult) (pediatric): Secondary | ICD-10-CM | POA: Insufficient documentation

## 2012-05-29 DIAGNOSIS — E109 Type 1 diabetes mellitus without complications: Secondary | ICD-10-CM | POA: Insufficient documentation

## 2012-05-29 DIAGNOSIS — E785 Hyperlipidemia, unspecified: Secondary | ICD-10-CM | POA: Insufficient documentation

## 2012-05-29 DIAGNOSIS — I319 Disease of pericardium, unspecified: Secondary | ICD-10-CM | POA: Insufficient documentation

## 2012-05-29 DIAGNOSIS — Q8500 Neurofibromatosis, unspecified: Secondary | ICD-10-CM | POA: Insufficient documentation

## 2012-05-29 DIAGNOSIS — Z9641 Presence of insulin pump (external) (internal): Secondary | ICD-10-CM | POA: Insufficient documentation

## 2012-05-31 ENCOUNTER — Encounter (HOSPITAL_COMMUNITY)
Admission: RE | Admit: 2012-05-31 | Discharge: 2012-05-31 | Disposition: A | Discharge status: Home / Self Care | Payer: Self-pay | Source: Ambulatory Visit | Attending: Cardiovascular Disease | Admitting: Cardiovascular Disease

## 2012-06-02 ENCOUNTER — Encounter (HOSPITAL_COMMUNITY)
Admission: RE | Admit: 2012-06-02 | Discharge: 2012-06-02 | Disposition: A | Discharge status: Home / Self Care | Payer: Self-pay | Source: Ambulatory Visit | Attending: Cardiovascular Disease | Admitting: Cardiovascular Disease

## 2012-06-05 ENCOUNTER — Encounter (HOSPITAL_COMMUNITY)
Admission: RE | Admit: 2012-06-05 | Discharge: 2012-06-05 | Disposition: A | Discharge status: Home / Self Care | Payer: Self-pay | Source: Ambulatory Visit | Attending: Cardiovascular Disease | Admitting: Cardiovascular Disease

## 2012-06-07 ENCOUNTER — Encounter (HOSPITAL_COMMUNITY)
Admission: RE | Admit: 2012-06-07 | Discharge: 2012-06-07 | Disposition: A | Discharge status: Home / Self Care | Payer: Self-pay | Source: Ambulatory Visit | Attending: Cardiovascular Disease | Admitting: Cardiovascular Disease

## 2012-06-09 ENCOUNTER — Encounter (HOSPITAL_COMMUNITY)
Admission: RE | Admit: 2012-06-09 | Discharge: 2012-06-09 | Disposition: A | Discharge status: Home / Self Care | Payer: Self-pay | Source: Ambulatory Visit | Attending: Cardiovascular Disease | Admitting: Cardiovascular Disease

## 2012-06-10 ENCOUNTER — Ambulatory Visit (INDEPENDENT_AMBULATORY_CARE_PROVIDER_SITE_OTHER): Payer: Medicare Other | Admitting: Internal Medicine

## 2012-06-10 ENCOUNTER — Ambulatory Visit: Payer: Medicare Other

## 2012-06-10 VITALS — BP 102/65 | HR 78 | Temp 98.1°F | Resp 18 | Ht 71.0 in | Wt 232.6 lb

## 2012-06-10 DIAGNOSIS — S93609A Unspecified sprain of unspecified foot, initial encounter: Secondary | ICD-10-CM

## 2012-06-10 DIAGNOSIS — M79676 Pain in unspecified toe(s): Secondary | ICD-10-CM

## 2012-06-10 DIAGNOSIS — S93501A Unspecified sprain of right great toe, initial encounter: Secondary | ICD-10-CM

## 2012-06-10 DIAGNOSIS — S20229A Contusion of unspecified back wall of thorax, initial encounter: Secondary | ICD-10-CM

## 2012-06-10 DIAGNOSIS — M79609 Pain in unspecified limb: Secondary | ICD-10-CM

## 2012-06-10 NOTE — Progress Notes (Signed)
  Subjective:    Patient ID: Jeff [name], male    DOB: 06/27/1966, 46 y.o.   MRN: [id number]  HPI slipped on stairs slamming foot into furniture at the bottom and hitting the right side of his back on the stair rail Now has pain in his big toe with walking or moving, and pain in his right flank when sits still for more than 30 minutes/no radiation of that pain  Diabetic patient status post recent MI and now in cardiac rehabilitation    Review of Systems No balance problems or syncope No numbness in his feet secondary to diabetes No prior foot injuries No underlying back problems known    Objective:   Physical Exam Vital signs stable except weight 232 pounds Mild distress from foot pain and back pain Spine straight and nontender to palpation There is tenderness in the right flank above the iliac crest with the pressure and with some twisting but this is judged to be mild The right foot has ecchymoses over the IP joint and pain with manipulation of the MTP and IP joints There is no laxity of the ligaments to stressors The first toe is swollen mildly in general  [facility] reading (PRIMARY) by  Dr. Merla Riches no fracture is seen about the MTP /IP of that great      Assessment & Plan:  Problem #1 contusion with sprain of great toe Weightbearing as tolerated Postop shoe to control flexion Ice if needed Ibuprofen if needed  Problem #2 contusion lumbar muscle Stretch

## 2012-06-12 ENCOUNTER — Encounter (HOSPITAL_COMMUNITY): Payer: Self-pay

## 2012-06-14 ENCOUNTER — Encounter (HOSPITAL_COMMUNITY)
Admission: RE | Admit: 2012-06-14 | Discharge: 2012-06-14 | Disposition: A | Discharge status: Home / Self Care | Payer: Self-pay | Source: Ambulatory Visit | Attending: Cardiovascular Disease | Admitting: Cardiovascular Disease

## 2012-06-16 ENCOUNTER — Encounter (HOSPITAL_COMMUNITY)
Admission: RE | Admit: 2012-06-16 | Discharge: 2012-06-16 | Disposition: A | Discharge status: Home / Self Care | Payer: Self-pay | Source: Ambulatory Visit | Attending: Cardiovascular Disease | Admitting: Cardiovascular Disease

## 2012-06-19 ENCOUNTER — Encounter (HOSPITAL_COMMUNITY)
Admission: RE | Admit: 2012-06-19 | Discharge: 2012-06-19 | Disposition: A | Discharge status: Home / Self Care | Payer: Self-pay | Source: Ambulatory Visit | Attending: Cardiovascular Disease | Admitting: Cardiovascular Disease

## 2012-06-23 ENCOUNTER — Encounter (HOSPITAL_COMMUNITY): Payer: Self-pay

## 2012-06-26 ENCOUNTER — Encounter (HOSPITAL_COMMUNITY)
Admission: RE | Admit: 2012-06-26 | Discharge: 2012-06-26 | Disposition: A | Discharge status: Home / Self Care | Payer: Self-pay | Source: Ambulatory Visit | Attending: Cardiovascular Disease | Admitting: Cardiovascular Disease

## 2012-06-30 ENCOUNTER — Encounter (HOSPITAL_COMMUNITY)
Admission: RE | Admit: 2012-06-30 | Discharge: 2012-06-30 | Disposition: A | Discharge status: Home / Self Care | Payer: Self-pay | Source: Ambulatory Visit | Attending: Cardiovascular Disease | Admitting: Cardiovascular Disease

## 2012-06-30 DIAGNOSIS — E785 Hyperlipidemia, unspecified: Secondary | ICD-10-CM | POA: Insufficient documentation

## 2012-06-30 DIAGNOSIS — Z9641 Presence of insulin pump (external) (internal): Secondary | ICD-10-CM | POA: Insufficient documentation

## 2012-06-30 DIAGNOSIS — Z5189 Encounter for other specified aftercare: Secondary | ICD-10-CM | POA: Insufficient documentation

## 2012-06-30 DIAGNOSIS — I319 Disease of pericardium, unspecified: Secondary | ICD-10-CM | POA: Insufficient documentation

## 2012-06-30 DIAGNOSIS — Z794 Long term (current) use of insulin: Secondary | ICD-10-CM | POA: Insufficient documentation

## 2012-06-30 DIAGNOSIS — Q8500 Neurofibromatosis, unspecified: Secondary | ICD-10-CM | POA: Insufficient documentation

## 2012-06-30 DIAGNOSIS — E109 Type 1 diabetes mellitus without complications: Secondary | ICD-10-CM | POA: Insufficient documentation

## 2012-06-30 DIAGNOSIS — Z951 Presence of aortocoronary bypass graft: Secondary | ICD-10-CM | POA: Insufficient documentation

## 2012-06-30 DIAGNOSIS — I498 Other specified cardiac arrhythmias: Secondary | ICD-10-CM | POA: Insufficient documentation

## 2012-06-30 DIAGNOSIS — G4733 Obstructive sleep apnea (adult) (pediatric): Secondary | ICD-10-CM | POA: Insufficient documentation

## 2012-06-30 DIAGNOSIS — I2510 Atherosclerotic heart disease of native coronary artery without angina pectoris: Secondary | ICD-10-CM | POA: Insufficient documentation

## 2012-06-30 DIAGNOSIS — I252 Old myocardial infarction: Secondary | ICD-10-CM | POA: Insufficient documentation

## 2012-06-30 DIAGNOSIS — F341 Dysthymic disorder: Secondary | ICD-10-CM | POA: Insufficient documentation

## 2012-06-30 DIAGNOSIS — I10 Essential (primary) hypertension: Secondary | ICD-10-CM | POA: Insufficient documentation

## 2012-06-30 DIAGNOSIS — Z79899 Other long term (current) drug therapy: Secondary | ICD-10-CM | POA: Insufficient documentation

## 2012-07-03 ENCOUNTER — Encounter (HOSPITAL_COMMUNITY)
Admission: RE | Admit: 2012-07-03 | Discharge: 2012-07-03 | Disposition: A | Discharge status: Home / Self Care | Payer: Self-pay | Source: Ambulatory Visit | Attending: Cardiovascular Disease | Admitting: Cardiovascular Disease

## 2012-07-05 ENCOUNTER — Encounter (HOSPITAL_COMMUNITY)
Admission: RE | Admit: 2012-07-05 | Discharge: 2012-07-05 | Disposition: A | Discharge status: Home / Self Care | Payer: Self-pay | Source: Ambulatory Visit | Attending: Cardiovascular Disease | Admitting: Cardiovascular Disease

## 2012-07-07 ENCOUNTER — Encounter (HOSPITAL_COMMUNITY)
Admission: RE | Admit: 2012-07-07 | Discharge: 2012-07-07 | Disposition: A | Discharge status: Home / Self Care | Payer: Self-pay | Source: Ambulatory Visit | Attending: Cardiovascular Disease | Admitting: Cardiovascular Disease

## 2012-07-10 ENCOUNTER — Encounter (HOSPITAL_COMMUNITY)
Admission: RE | Admit: 2012-07-10 | Discharge: 2012-07-10 | Disposition: A | Discharge status: Home / Self Care | Payer: Self-pay | Source: Ambulatory Visit | Attending: Cardiovascular Disease | Admitting: Cardiovascular Disease

## 2012-07-12 ENCOUNTER — Encounter (HOSPITAL_COMMUNITY)
Admission: RE | Admit: 2012-07-12 | Discharge: 2012-07-12 | Disposition: A | Discharge status: Home / Self Care | Payer: Self-pay | Source: Ambulatory Visit | Attending: Cardiovascular Disease | Admitting: Cardiovascular Disease

## 2012-07-14 ENCOUNTER — Encounter (HOSPITAL_COMMUNITY)
Admission: RE | Admit: 2012-07-14 | Discharge: 2012-07-14 | Disposition: A | Discharge status: Home / Self Care | Payer: Self-pay | Source: Ambulatory Visit | Attending: Cardiovascular Disease | Admitting: Cardiovascular Disease

## 2012-07-17 ENCOUNTER — Encounter (HOSPITAL_COMMUNITY)
Admission: RE | Admit: 2012-07-17 | Discharge: 2012-07-17 | Disposition: A | Discharge status: Home / Self Care | Payer: Self-pay | Source: Ambulatory Visit | Attending: Cardiovascular Disease | Admitting: Cardiovascular Disease

## 2012-07-19 ENCOUNTER — Encounter (HOSPITAL_COMMUNITY)
Admission: RE | Admit: 2012-07-19 | Discharge: 2012-07-19 | Disposition: A | Discharge status: Home / Self Care | Payer: Self-pay | Source: Ambulatory Visit | Attending: Cardiovascular Disease | Admitting: Cardiovascular Disease

## 2012-07-21 ENCOUNTER — Encounter (HOSPITAL_COMMUNITY)
Admission: RE | Admit: 2012-07-21 | Discharge: 2012-07-21 | Disposition: A | Discharge status: Home / Self Care | Payer: Self-pay | Source: Ambulatory Visit | Attending: Cardiovascular Disease | Admitting: Cardiovascular Disease

## 2012-07-24 ENCOUNTER — Encounter (HOSPITAL_COMMUNITY)
Admission: RE | Admit: 2012-07-24 | Discharge: 2012-07-24 | Disposition: A | Discharge status: Home / Self Care | Payer: Self-pay | Source: Ambulatory Visit | Attending: Cardiovascular Disease | Admitting: Cardiovascular Disease

## 2012-07-26 ENCOUNTER — Encounter (HOSPITAL_COMMUNITY)
Admission: RE | Admit: 2012-07-26 | Discharge: 2012-07-26 | Disposition: A | Discharge status: Home / Self Care | Payer: Self-pay | Source: Ambulatory Visit | Attending: Cardiovascular Disease | Admitting: Cardiovascular Disease

## 2012-07-28 ENCOUNTER — Encounter (HOSPITAL_COMMUNITY)
Admission: RE | Admit: 2012-07-28 | Discharge: 2012-07-28 | Disposition: A | Discharge status: Home / Self Care | Payer: Self-pay | Source: Ambulatory Visit | Attending: Cardiovascular Disease | Admitting: Cardiovascular Disease

## 2012-07-31 ENCOUNTER — Encounter (HOSPITAL_COMMUNITY)
Admission: RE | Admit: 2012-07-31 | Discharge: 2012-07-31 | Disposition: A | Discharge status: Home / Self Care | Payer: Self-pay | Source: Ambulatory Visit | Attending: Cardiovascular Disease | Admitting: Cardiovascular Disease

## 2012-07-31 DIAGNOSIS — E109 Type 1 diabetes mellitus without complications: Secondary | ICD-10-CM | POA: Insufficient documentation

## 2012-07-31 DIAGNOSIS — Z79899 Other long term (current) drug therapy: Secondary | ICD-10-CM | POA: Insufficient documentation

## 2012-07-31 DIAGNOSIS — Q8500 Neurofibromatosis, unspecified: Secondary | ICD-10-CM | POA: Insufficient documentation

## 2012-07-31 DIAGNOSIS — I498 Other specified cardiac arrhythmias: Secondary | ICD-10-CM | POA: Insufficient documentation

## 2012-07-31 DIAGNOSIS — I2510 Atherosclerotic heart disease of native coronary artery without angina pectoris: Secondary | ICD-10-CM | POA: Insufficient documentation

## 2012-07-31 DIAGNOSIS — F341 Dysthymic disorder: Secondary | ICD-10-CM | POA: Insufficient documentation

## 2012-07-31 DIAGNOSIS — Z794 Long term (current) use of insulin: Secondary | ICD-10-CM | POA: Insufficient documentation

## 2012-07-31 DIAGNOSIS — Z951 Presence of aortocoronary bypass graft: Secondary | ICD-10-CM | POA: Insufficient documentation

## 2012-07-31 DIAGNOSIS — G4733 Obstructive sleep apnea (adult) (pediatric): Secondary | ICD-10-CM | POA: Insufficient documentation

## 2012-07-31 DIAGNOSIS — Z5189 Encounter for other specified aftercare: Secondary | ICD-10-CM | POA: Insufficient documentation

## 2012-07-31 DIAGNOSIS — E785 Hyperlipidemia, unspecified: Secondary | ICD-10-CM | POA: Insufficient documentation

## 2012-07-31 DIAGNOSIS — Z9641 Presence of insulin pump (external) (internal): Secondary | ICD-10-CM | POA: Insufficient documentation

## 2012-07-31 DIAGNOSIS — I10 Essential (primary) hypertension: Secondary | ICD-10-CM | POA: Insufficient documentation

## 2012-07-31 DIAGNOSIS — I252 Old myocardial infarction: Secondary | ICD-10-CM | POA: Insufficient documentation

## 2012-07-31 DIAGNOSIS — I319 Disease of pericardium, unspecified: Secondary | ICD-10-CM | POA: Insufficient documentation

## 2012-08-02 ENCOUNTER — Encounter (HOSPITAL_COMMUNITY)
Admission: RE | Admit: 2012-08-02 | Discharge: 2012-08-02 | Disposition: A | Discharge status: Home / Self Care | Payer: Self-pay | Source: Ambulatory Visit | Attending: Cardiovascular Disease | Admitting: Cardiovascular Disease

## 2012-08-04 ENCOUNTER — Encounter (HOSPITAL_COMMUNITY)
Admission: RE | Admit: 2012-08-04 | Discharge: 2012-08-04 | Disposition: A | Discharge status: Home / Self Care | Payer: Self-pay | Source: Ambulatory Visit | Attending: Cardiovascular Disease | Admitting: Cardiovascular Disease

## 2012-08-04 ENCOUNTER — Other Ambulatory Visit: Payer: Self-pay | Admitting: Neurology

## 2012-08-04 DIAGNOSIS — Q8501 Neurofibromatosis, type 1: Secondary | ICD-10-CM

## 2012-08-04 DIAGNOSIS — G40209 Localization-related (focal) (partial) symptomatic epilepsy and epileptic syndromes with complex partial seizures, not intractable, without status epilepticus: Secondary | ICD-10-CM

## 2012-08-07 ENCOUNTER — Encounter (HOSPITAL_COMMUNITY)
Admission: RE | Admit: 2012-08-07 | Discharge: 2012-08-07 | Disposition: A | Discharge status: Home / Self Care | Payer: Self-pay | Source: Ambulatory Visit | Attending: Cardiovascular Disease | Admitting: Cardiovascular Disease

## 2012-08-09 ENCOUNTER — Encounter (HOSPITAL_COMMUNITY)
Admission: RE | Admit: 2012-08-09 | Discharge: 2012-08-09 | Disposition: A | Discharge status: Home / Self Care | Payer: Self-pay | Source: Ambulatory Visit | Attending: Cardiovascular Disease | Admitting: Cardiovascular Disease

## 2012-08-10 ENCOUNTER — Other Ambulatory Visit: Payer: Medicare Other

## 2012-08-11 ENCOUNTER — Encounter (HOSPITAL_COMMUNITY): Payer: Self-pay

## 2012-08-13 ENCOUNTER — Ambulatory Visit
Admission: RE | Admit: 2012-08-13 | Discharge: 2012-08-13 | Disposition: A | Discharge status: Home / Self Care | Payer: 59 | Source: Ambulatory Visit | Attending: Neurology | Admitting: Neurology

## 2012-08-13 DIAGNOSIS — Q8501 Neurofibromatosis, type 1: Secondary | ICD-10-CM

## 2012-08-13 DIAGNOSIS — G40209 Localization-related (focal) (partial) symptomatic epilepsy and epileptic syndromes with complex partial seizures, not intractable, without status epilepticus: Secondary | ICD-10-CM

## 2012-08-13 MED ORDER — GADOBENATE DIMEGLUMINE 529 MG/ML IV SOLN
20.0000 mL | Freq: Once | INTRAVENOUS | Status: AC | PRN
  Administered 2012-08-13: 20 mL via INTRAVENOUS

## 2012-08-14 ENCOUNTER — Encounter (HOSPITAL_COMMUNITY)
Admission: RE | Admit: 2012-08-14 | Discharge: 2012-08-14 | Disposition: A | Discharge status: Home / Self Care | Payer: Self-pay | Source: Ambulatory Visit | Attending: Cardiovascular Disease | Admitting: Cardiovascular Disease

## 2012-08-16 ENCOUNTER — Encounter (HOSPITAL_COMMUNITY)
Admission: RE | Admit: 2012-08-16 | Discharge: 2012-08-16 | Disposition: A | Discharge status: Home / Self Care | Payer: Self-pay | Source: Ambulatory Visit | Attending: Cardiovascular Disease | Admitting: Cardiovascular Disease

## 2012-08-18 ENCOUNTER — Encounter (HOSPITAL_COMMUNITY)
Admission: RE | Admit: 2012-08-18 | Discharge: 2012-08-18 | Disposition: A | Discharge status: Home / Self Care | Payer: Self-pay | Source: Ambulatory Visit | Attending: Cardiovascular Disease | Admitting: Cardiovascular Disease

## 2012-08-21 ENCOUNTER — Encounter (HOSPITAL_COMMUNITY)
Admission: RE | Admit: 2012-08-21 | Discharge: 2012-08-21 | Disposition: A | Discharge status: Home / Self Care | Payer: Self-pay | Source: Ambulatory Visit | Attending: Cardiovascular Disease | Admitting: Cardiovascular Disease

## 2012-08-23 ENCOUNTER — Encounter (HOSPITAL_COMMUNITY)
Admission: RE | Admit: 2012-08-23 | Discharge: 2012-08-23 | Disposition: A | Discharge status: Home / Self Care | Payer: Self-pay | Source: Ambulatory Visit | Attending: Cardiovascular Disease | Admitting: Cardiovascular Disease

## 2012-08-24 ENCOUNTER — Other Ambulatory Visit: Payer: Self-pay | Admitting: Cardiovascular Disease

## 2012-08-25 ENCOUNTER — Encounter (HOSPITAL_COMMUNITY)
Admission: RE | Admit: 2012-08-25 | Discharge: 2012-08-25 | Disposition: A | Discharge status: Home / Self Care | Payer: Self-pay | Source: Ambulatory Visit | Attending: Cardiovascular Disease | Admitting: Cardiovascular Disease

## 2012-08-28 ENCOUNTER — Encounter (HOSPITAL_COMMUNITY): Payer: Self-pay

## 2012-08-28 DIAGNOSIS — I319 Disease of pericardium, unspecified: Secondary | ICD-10-CM | POA: Insufficient documentation

## 2012-08-28 DIAGNOSIS — I498 Other specified cardiac arrhythmias: Secondary | ICD-10-CM | POA: Insufficient documentation

## 2012-08-28 DIAGNOSIS — Q8500 Neurofibromatosis, unspecified: Secondary | ICD-10-CM | POA: Insufficient documentation

## 2012-08-28 DIAGNOSIS — I2510 Atherosclerotic heart disease of native coronary artery without angina pectoris: Secondary | ICD-10-CM | POA: Insufficient documentation

## 2012-08-28 DIAGNOSIS — E109 Type 1 diabetes mellitus without complications: Secondary | ICD-10-CM | POA: Insufficient documentation

## 2012-08-28 DIAGNOSIS — F341 Dysthymic disorder: Secondary | ICD-10-CM | POA: Insufficient documentation

## 2012-08-28 DIAGNOSIS — Z5189 Encounter for other specified aftercare: Secondary | ICD-10-CM | POA: Insufficient documentation

## 2012-08-28 DIAGNOSIS — E785 Hyperlipidemia, unspecified: Secondary | ICD-10-CM | POA: Insufficient documentation

## 2012-08-28 DIAGNOSIS — I252 Old myocardial infarction: Secondary | ICD-10-CM | POA: Insufficient documentation

## 2012-08-28 DIAGNOSIS — Z794 Long term (current) use of insulin: Secondary | ICD-10-CM | POA: Insufficient documentation

## 2012-08-28 DIAGNOSIS — G4733 Obstructive sleep apnea (adult) (pediatric): Secondary | ICD-10-CM | POA: Insufficient documentation

## 2012-08-28 DIAGNOSIS — Z9641 Presence of insulin pump (external) (internal): Secondary | ICD-10-CM | POA: Insufficient documentation

## 2012-08-28 DIAGNOSIS — Z79899 Other long term (current) drug therapy: Secondary | ICD-10-CM | POA: Insufficient documentation

## 2012-08-28 DIAGNOSIS — I10 Essential (primary) hypertension: Secondary | ICD-10-CM | POA: Insufficient documentation

## 2012-08-28 DIAGNOSIS — Z951 Presence of aortocoronary bypass graft: Secondary | ICD-10-CM | POA: Insufficient documentation

## 2012-08-30 ENCOUNTER — Encounter (HOSPITAL_COMMUNITY)
Admission: RE | Admit: 2012-08-30 | Discharge: 2012-08-30 | Disposition: A | Discharge status: Home / Self Care | Payer: Self-pay | Source: Ambulatory Visit | Attending: Cardiovascular Disease | Admitting: Cardiovascular Disease

## 2012-09-01 ENCOUNTER — Encounter (HOSPITAL_COMMUNITY): Payer: Self-pay

## 2012-09-04 ENCOUNTER — Encounter (HOSPITAL_COMMUNITY)
Admission: RE | Admit: 2012-09-04 | Discharge: 2012-09-04 | Disposition: A | Discharge status: Home / Self Care | Payer: Self-pay | Source: Ambulatory Visit | Attending: Cardiovascular Disease | Admitting: Cardiovascular Disease

## 2012-09-06 ENCOUNTER — Encounter (HOSPITAL_COMMUNITY)
Admission: RE | Admit: 2012-09-06 | Discharge: 2012-09-06 | Disposition: A | Discharge status: Home / Self Care | Payer: Self-pay | Source: Ambulatory Visit | Attending: Cardiovascular Disease | Admitting: Cardiovascular Disease

## 2012-09-08 ENCOUNTER — Encounter (HOSPITAL_COMMUNITY)
Admission: RE | Admit: 2012-09-08 | Discharge: 2012-09-08 | Disposition: A | Discharge status: Home / Self Care | Payer: Self-pay | Source: Ambulatory Visit | Attending: Cardiovascular Disease | Admitting: Cardiovascular Disease

## 2012-09-11 ENCOUNTER — Encounter (HOSPITAL_COMMUNITY)
Admission: RE | Admit: 2012-09-11 | Discharge: 2012-09-11 | Disposition: A | Discharge status: Home / Self Care | Payer: Self-pay | Source: Ambulatory Visit | Attending: Cardiovascular Disease | Admitting: Cardiovascular Disease

## 2012-09-13 ENCOUNTER — Encounter: Payer: Self-pay | Admitting: Cardiovascular Disease

## 2012-09-13 ENCOUNTER — Encounter (HOSPITAL_COMMUNITY)
Admission: RE | Admit: 2012-09-13 | Discharge: 2012-09-13 | Disposition: A | Discharge status: Home / Self Care | Payer: Self-pay | Source: Ambulatory Visit | Attending: Cardiovascular Disease | Admitting: Cardiovascular Disease

## 2012-09-15 ENCOUNTER — Encounter (HOSPITAL_COMMUNITY)
Admission: RE | Admit: 2012-09-15 | Discharge: 2012-09-15 | Disposition: A | Discharge status: Home / Self Care | Payer: Self-pay | Source: Ambulatory Visit | Attending: Cardiovascular Disease | Admitting: Cardiovascular Disease

## 2012-09-18 ENCOUNTER — Encounter (HOSPITAL_COMMUNITY)
Admission: RE | Admit: 2012-09-18 | Discharge: 2012-09-18 | Disposition: A | Discharge status: Home / Self Care | Payer: Self-pay | Source: Ambulatory Visit | Attending: Cardiovascular Disease | Admitting: Cardiovascular Disease

## 2012-09-20 ENCOUNTER — Encounter (HOSPITAL_COMMUNITY)
Admission: RE | Admit: 2012-09-20 | Discharge: 2012-09-20 | Disposition: A | Discharge status: Home / Self Care | Payer: Self-pay | Source: Ambulatory Visit | Attending: Cardiovascular Disease | Admitting: Cardiovascular Disease

## 2012-09-22 ENCOUNTER — Encounter (HOSPITAL_COMMUNITY)
Admission: RE | Admit: 2012-09-22 | Discharge: 2012-09-22 | Disposition: A | Discharge status: Home / Self Care | Payer: Self-pay | Source: Ambulatory Visit | Attending: Cardiovascular Disease | Admitting: Cardiovascular Disease

## 2012-09-25 ENCOUNTER — Encounter (HOSPITAL_COMMUNITY)
Admission: RE | Admit: 2012-09-25 | Discharge: 2012-09-25 | Disposition: A | Discharge status: Home / Self Care | Payer: Self-pay | Source: Ambulatory Visit | Attending: Cardiovascular Disease | Admitting: Cardiovascular Disease

## 2012-09-27 ENCOUNTER — Encounter (HOSPITAL_COMMUNITY)
Admission: RE | Admit: 2012-09-27 | Discharge: 2012-09-27 | Disposition: A | Discharge status: Home / Self Care | Payer: Self-pay | Source: Ambulatory Visit | Attending: Cardiovascular Disease | Admitting: Cardiovascular Disease

## 2012-09-27 DIAGNOSIS — Q8500 Neurofibromatosis, unspecified: Secondary | ICD-10-CM | POA: Insufficient documentation

## 2012-09-27 DIAGNOSIS — G4733 Obstructive sleep apnea (adult) (pediatric): Secondary | ICD-10-CM | POA: Insufficient documentation

## 2012-09-27 DIAGNOSIS — E785 Hyperlipidemia, unspecified: Secondary | ICD-10-CM | POA: Insufficient documentation

## 2012-09-27 DIAGNOSIS — F341 Dysthymic disorder: Secondary | ICD-10-CM | POA: Insufficient documentation

## 2012-09-27 DIAGNOSIS — I2510 Atherosclerotic heart disease of native coronary artery without angina pectoris: Secondary | ICD-10-CM | POA: Insufficient documentation

## 2012-09-27 DIAGNOSIS — I319 Disease of pericardium, unspecified: Secondary | ICD-10-CM | POA: Insufficient documentation

## 2012-09-27 DIAGNOSIS — I498 Other specified cardiac arrhythmias: Secondary | ICD-10-CM | POA: Insufficient documentation

## 2012-09-27 DIAGNOSIS — E109 Type 1 diabetes mellitus without complications: Secondary | ICD-10-CM | POA: Insufficient documentation

## 2012-09-27 DIAGNOSIS — I252 Old myocardial infarction: Secondary | ICD-10-CM | POA: Insufficient documentation

## 2012-09-27 DIAGNOSIS — Z5189 Encounter for other specified aftercare: Secondary | ICD-10-CM | POA: Insufficient documentation

## 2012-09-27 DIAGNOSIS — Z9641 Presence of insulin pump (external) (internal): Secondary | ICD-10-CM | POA: Insufficient documentation

## 2012-09-27 DIAGNOSIS — I10 Essential (primary) hypertension: Secondary | ICD-10-CM | POA: Insufficient documentation

## 2012-09-27 DIAGNOSIS — Z794 Long term (current) use of insulin: Secondary | ICD-10-CM | POA: Insufficient documentation

## 2012-09-27 DIAGNOSIS — Z951 Presence of aortocoronary bypass graft: Secondary | ICD-10-CM | POA: Insufficient documentation

## 2012-09-27 DIAGNOSIS — Z79899 Other long term (current) drug therapy: Secondary | ICD-10-CM | POA: Insufficient documentation

## 2012-09-29 ENCOUNTER — Encounter (HOSPITAL_COMMUNITY)
Admission: RE | Admit: 2012-09-29 | Discharge: 2012-09-29 | Disposition: A | Discharge status: Home / Self Care | Payer: Self-pay | Source: Ambulatory Visit | Attending: Cardiovascular Disease | Admitting: Cardiovascular Disease

## 2012-10-02 ENCOUNTER — Encounter (HOSPITAL_COMMUNITY)
Admission: RE | Admit: 2012-10-02 | Discharge: 2012-10-02 | Disposition: A | Discharge status: Home / Self Care | Payer: Self-pay | Source: Ambulatory Visit | Attending: Cardiovascular Disease | Admitting: Cardiovascular Disease

## 2012-10-04 ENCOUNTER — Encounter (HOSPITAL_COMMUNITY)
Admission: RE | Admit: 2012-10-04 | Discharge: 2012-10-04 | Disposition: A | Discharge status: Home / Self Care | Payer: Self-pay | Source: Ambulatory Visit | Attending: Cardiovascular Disease | Admitting: Cardiovascular Disease

## 2012-10-06 ENCOUNTER — Encounter (HOSPITAL_COMMUNITY)
Admission: RE | Admit: 2012-10-06 | Discharge: 2012-10-06 | Disposition: A | Discharge status: Home / Self Care | Payer: Self-pay | Source: Ambulatory Visit | Attending: Cardiovascular Disease | Admitting: Cardiovascular Disease

## 2012-10-09 ENCOUNTER — Encounter (HOSPITAL_COMMUNITY)
Admission: RE | Admit: 2012-10-09 | Discharge: 2012-10-09 | Disposition: A | Discharge status: Home / Self Care | Payer: Self-pay | Source: Ambulatory Visit | Attending: Cardiovascular Disease | Admitting: Cardiovascular Disease

## 2012-10-11 ENCOUNTER — Encounter (HOSPITAL_COMMUNITY)
Admission: RE | Admit: 2012-10-11 | Discharge: 2012-10-11 | Disposition: A | Discharge status: Home / Self Care | Payer: Self-pay | Source: Ambulatory Visit | Attending: Cardiovascular Disease | Admitting: Cardiovascular Disease

## 2012-10-13 ENCOUNTER — Encounter (HOSPITAL_COMMUNITY): Payer: Self-pay

## 2012-10-16 ENCOUNTER — Encounter (HOSPITAL_COMMUNITY)
Admission: RE | Admit: 2012-10-16 | Discharge: 2012-10-16 | Disposition: A | Discharge status: Home / Self Care | Payer: Self-pay | Source: Ambulatory Visit | Attending: Cardiovascular Disease | Admitting: Cardiovascular Disease

## 2012-10-18 ENCOUNTER — Encounter (HOSPITAL_COMMUNITY)
Admission: RE | Admit: 2012-10-18 | Discharge: 2012-10-18 | Disposition: A | Discharge status: Home / Self Care | Payer: Self-pay | Source: Ambulatory Visit | Attending: Cardiovascular Disease | Admitting: Cardiovascular Disease

## 2012-10-20 ENCOUNTER — Encounter (HOSPITAL_COMMUNITY)
Admission: RE | Admit: 2012-10-20 | Discharge: 2012-10-20 | Disposition: A | Discharge status: Home / Self Care | Payer: Self-pay | Source: Ambulatory Visit | Attending: Cardiovascular Disease | Admitting: Cardiovascular Disease

## 2012-10-23 ENCOUNTER — Encounter (HOSPITAL_COMMUNITY)
Admission: RE | Admit: 2012-10-23 | Discharge: 2012-10-23 | Disposition: A | Discharge status: Home / Self Care | Payer: Self-pay | Source: Ambulatory Visit | Attending: Cardiovascular Disease | Admitting: Cardiovascular Disease

## 2012-10-25 ENCOUNTER — Ambulatory Visit: Payer: Medicare Other | Admitting: Cardiovascular Disease

## 2012-10-25 ENCOUNTER — Encounter (HOSPITAL_COMMUNITY)
Admission: RE | Admit: 2012-10-25 | Discharge: 2012-10-25 | Disposition: A | Discharge status: Home / Self Care | Payer: Self-pay | Source: Ambulatory Visit | Attending: Cardiovascular Disease | Admitting: Cardiovascular Disease

## 2012-10-27 ENCOUNTER — Encounter (HOSPITAL_COMMUNITY)
Admission: RE | Admit: 2012-10-27 | Discharge: 2012-10-27 | Disposition: A | Discharge status: Home / Self Care | Payer: Self-pay | Source: Ambulatory Visit | Attending: Cardiovascular Disease | Admitting: Cardiovascular Disease

## 2012-10-27 DIAGNOSIS — G4733 Obstructive sleep apnea (adult) (pediatric): Secondary | ICD-10-CM | POA: Insufficient documentation

## 2012-10-27 DIAGNOSIS — I252 Old myocardial infarction: Secondary | ICD-10-CM | POA: Insufficient documentation

## 2012-10-27 DIAGNOSIS — Z5189 Encounter for other specified aftercare: Secondary | ICD-10-CM | POA: Insufficient documentation

## 2012-10-27 DIAGNOSIS — E785 Hyperlipidemia, unspecified: Secondary | ICD-10-CM | POA: Insufficient documentation

## 2012-10-27 DIAGNOSIS — I2510 Atherosclerotic heart disease of native coronary artery without angina pectoris: Secondary | ICD-10-CM | POA: Insufficient documentation

## 2012-10-27 DIAGNOSIS — Q8500 Neurofibromatosis, unspecified: Secondary | ICD-10-CM | POA: Insufficient documentation

## 2012-10-27 DIAGNOSIS — Z951 Presence of aortocoronary bypass graft: Secondary | ICD-10-CM | POA: Insufficient documentation

## 2012-10-27 DIAGNOSIS — Z79899 Other long term (current) drug therapy: Secondary | ICD-10-CM | POA: Insufficient documentation

## 2012-10-27 DIAGNOSIS — I10 Essential (primary) hypertension: Secondary | ICD-10-CM | POA: Insufficient documentation

## 2012-10-27 DIAGNOSIS — E109 Type 1 diabetes mellitus without complications: Secondary | ICD-10-CM | POA: Insufficient documentation

## 2012-10-27 DIAGNOSIS — I498 Other specified cardiac arrhythmias: Secondary | ICD-10-CM | POA: Insufficient documentation

## 2012-10-27 DIAGNOSIS — F341 Dysthymic disorder: Secondary | ICD-10-CM | POA: Insufficient documentation

## 2012-10-27 DIAGNOSIS — Z9641 Presence of insulin pump (external) (internal): Secondary | ICD-10-CM | POA: Insufficient documentation

## 2012-10-27 DIAGNOSIS — I319 Disease of pericardium, unspecified: Secondary | ICD-10-CM | POA: Insufficient documentation

## 2012-10-27 DIAGNOSIS — Z794 Long term (current) use of insulin: Secondary | ICD-10-CM | POA: Insufficient documentation

## 2012-10-30 ENCOUNTER — Encounter (HOSPITAL_COMMUNITY)
Admission: RE | Admit: 2012-10-30 | Discharge: 2012-10-30 | Disposition: A | Discharge status: Home / Self Care | Payer: Self-pay | Source: Ambulatory Visit | Attending: Cardiovascular Disease | Admitting: Cardiovascular Disease

## 2012-11-01 ENCOUNTER — Encounter (HOSPITAL_COMMUNITY)
Admission: RE | Admit: 2012-11-01 | Discharge: 2012-11-01 | Disposition: A | Discharge status: Home / Self Care | Payer: Self-pay | Source: Ambulatory Visit | Attending: Cardiovascular Disease | Admitting: Cardiovascular Disease

## 2012-11-03 ENCOUNTER — Ambulatory Visit (INDEPENDENT_AMBULATORY_CARE_PROVIDER_SITE_OTHER): Payer: 59 | Admitting: Cardiovascular Disease

## 2012-11-03 ENCOUNTER — Encounter: Payer: Self-pay | Admitting: Cardiovascular Disease

## 2012-11-03 ENCOUNTER — Encounter (HOSPITAL_COMMUNITY)
Admission: RE | Admit: 2012-11-03 | Discharge: 2012-11-03 | Disposition: A | Discharge status: Home / Self Care | Payer: Self-pay | Source: Ambulatory Visit | Attending: Cardiovascular Disease | Admitting: Cardiovascular Disease

## 2012-11-03 VITALS — BP 110/70 | HR 74 | Ht 71.0 in | Wt 236.0 lb

## 2012-11-03 DIAGNOSIS — I2510 Atherosclerotic heart disease of native coronary artery without angina pectoris: Secondary | ICD-10-CM

## 2012-11-03 NOTE — Progress Notes (Signed)
HPI:  47 year old gentleman presenting for followup of coronary artery disease. He has extensive CAD with history of CABG. He had early bypass graft failure and underwent multivessel PCI.  Overall he is doing well. He continues with the maintenance phase of cardiac rehabilitation. He's had no change in his anginal threshold. He gets frustrated because he cannot increase his exercise program any further. He is able to exercise for 10 minutes on each of 3 different activities. He has stable dyspnea with exertion. He does complain of dyspnea when he bends forward to tie his shoes. He's had no recent anginal pain, nor has he had to take nitroglycerin. He denies orthopnea, PND, or leg swelling. He's having a lot of problems with sleep.  Outpatient Encounter Prescriptions as of 11/03/2012  Medication Sig Dispense Refill  . aspirin 81 MG tablet Take 81 mg by mouth daily.        . carbamazepine (TEGRETOL XR) 200 MG 12 hr tablet Take 600 mg by mouth 2 (two) times daily.       [name] docusate sodium (COLACE) 100 MG capsule Take 100 mg by mouth daily.        [name] EFFIENT 10 MG TABS take 1 tablet by mouth daily  30 tablet  6  . ezetimibe (ZETIA) 10 MG tablet Take 10 mg by mouth at bedtime.       . Insulin Human (INSULIN PUMP) 100 unit/ml SOLN Inject into the skin continuous. Novolog Insulin Basal Rate as follows: 12am-1.1 units, 6am-1.9 units, 10am-2.0 units, 12pm-2.2 units & 6pm-2.1 units      . isosorbide mononitrate (IMDUR) 60 MG 24 hr tablet Take 60 mg by mouth daily.      . Lancets (ONETOUCH ULTRASOFT) lancets       . levETIRAcetam (KEPPRA) 500 MG tablet Take 250-500 mg by mouth 2 (two) times daily. Take 1/2 tablet in the morning and 1 tablet in the evening      . lisinopril (PRINIVIL,ZESTRIL) 10 MG tablet Take 5 mg by mouth daily.       . metoprolol tartrate (LOPRESSOR) 25 MG tablet Take 25 mg by mouth 2 (two) times daily.      . multivitamin (ONE-A-DAY MEN'S) TABS Take 1 tablet by mouth daily.       .  nitroGLYCERIN (NITROSTAT) 0.4 MG SL tablet Place 1 tablet (0.4 mg total) under the tongue every 5 (five) minutes as needed.  25 tablet  1  . NOVOLOG 100 UNIT/ML injection       . ONE TOUCH ULTRA TEST test strip       . prasugrel (EFFIENT) 10 MG TABS Take 10 mg by mouth daily.      . rosuvastatin (CRESTOR) 20 MG tablet Take 20 mg by mouth at bedtime.       . sertraline (ZOLOFT) 100 MG tablet Take 150 mg by mouth at bedtime. One and half tablet at bedtime      . vitamin B-12 (CYANOCOBALAMIN) 500 MCG tablet Take 500 mcg by mouth daily.      . [DISCONTINUED] metoprolol tartrate (LOPRESSOR) 25 MG tablet take 1 tablet by mouth twice a day  60 tablet  6   No facility-administered encounter medications on file as of 11/03/2012.    Allergies  Allergen Reactions  . Latex Rash    Past Medical History  Diagnosis Date  . CAD (coronary artery disease)     Cath 2011.  LAD stent patent, distal LAD occlusion, D1 100%, OM branch 80 - 90%,  SVG to diag patent, LIMA to LAD occluded, SVG to RCA occluded, SVG to OM was occluded.   No change from previous cath.  Managed medically.   . Diabetes mellitus type I     on insulin pump at  home  . Sleep apnea     s/p oral surgery  . Hyperlipidemia   . 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 Diabetic retinopathy(362.0)     s/p vitrectomy and history of retinal surgery  . HTN (hypertension)   . Seizure disorder   . Seasonal allergic rhinitis   . Depression   . Cataract   . Hemorrhoid   . Myocardial infarction   . Anxiety     ROS: Negative except as per HPI  BP 110/70  Pulse 74  Ht 5\' 11"  (1.803 m)  Wt 107.049 kg (236 lb)  BMI 32.93 kg/m2  SpO2 93%  PHYSICAL EXAM: Pt is alert and oriented, NAD HEENT: normal Neck: JVP - normal, carotids 2+= without bruits Lungs: CTA bilaterally CV: RRR without murmur or gallop Abd: soft, NT, Positive BS, no hepatomegaly Ext: no C/C/E, distal pulses intact and equal Skin:  warm/dry no rash  EKG:  Normal sinus rhythm 74 beats per minute, rightward axis, concave upward inferior ST elevation unchanged from previous tracings, possible right ventricular hypertrophy  ASSESSMENT AND PLAN: 1. Coronary artery disease, bypass graft occlusion and subsequent multivessel PCI. The patient remained stable with CCS class II anginal symptoms. His PCI procedure was over 2 years ago. Per guideline recommendations will discontinue effient after he runs out of his current supply. He should remain on aspirin 81 mg indefinitely. His anginal threshold has improved with increase of isosorbide and we will continue his same medications. I'll see him back in 6 months and he will notify me if there any changes in his cardiac symptoms in the interim.  2. Hyperlipidemia. Followed by Dr. Waynard Edwards. He has an appointment with labwork in the near future.  Tonny Bollman 11/03/2012 12:11 PM

## 2012-11-03 NOTE — Patient Instructions (Addendum)
Your physician has recommended you make the following change in your medication: You can stop Effient when you finish your current supply  Your physician recommends that you continue on your current medications as directed. Please refer to the Current Medication list given to you today.  Your physician wants you to follow-up in: 6 MONTHS with Dr Excell Seltzer.  You will receive a reminder letter in the mail two months in advance. If you don't receive a letter, please call our office to schedule the follow-up appointment.

## 2012-11-06 ENCOUNTER — Encounter (HOSPITAL_COMMUNITY)
Admission: RE | Admit: 2012-11-06 | Discharge: 2012-11-06 | Disposition: A | Discharge status: Home / Self Care | Payer: Self-pay | Source: Ambulatory Visit | Attending: Cardiovascular Disease | Admitting: Cardiovascular Disease

## 2012-11-08 ENCOUNTER — Encounter (HOSPITAL_COMMUNITY): Payer: Self-pay

## 2012-11-10 ENCOUNTER — Ambulatory Visit (INDEPENDENT_AMBULATORY_CARE_PROVIDER_SITE_OTHER): Payer: Medicare Other | Admitting: Family Medicine

## 2012-11-10 ENCOUNTER — Encounter (HOSPITAL_COMMUNITY): Payer: Self-pay

## 2012-11-10 ENCOUNTER — Ambulatory Visit: Payer: Medicare Other

## 2012-11-10 VITALS — BP 136/70 | HR 77 | Temp 98.6°F | Resp 16 | Ht 72.0 in | Wt 240.8 lb

## 2012-11-10 DIAGNOSIS — S59909A Unspecified injury of unspecified elbow, initial encounter: Secondary | ICD-10-CM

## 2012-11-10 DIAGNOSIS — M25522 Pain in left elbow: Secondary | ICD-10-CM

## 2012-11-10 DIAGNOSIS — S59902A Unspecified injury of left elbow, initial encounter: Secondary | ICD-10-CM

## 2012-11-10 DIAGNOSIS — M25529 Pain in unspecified elbow: Secondary | ICD-10-CM

## 2012-11-10 MED ORDER — SILVER SULFADIAZINE 1 % EX CREA: TOPICAL_CREAM | Freq: Every day | CUTANEOUS | Status: DC

## 2012-11-10 NOTE — Progress Notes (Signed)
  Subjective:    Patient ID: Jeff [name], male    DOB: 11/01/1965, 47 y.o.   MRN: [id number]  HPI Pt here because he fell at [facility] on [address] Wednesday around 1:30pm . He was going to his car and tripped over a parking stop in the driveway. His left elbow was injured during the fall. He has diabetes and is concerned about his wound care. It is painful but he is able to bend his arm.    Patient works for [employer].  He is left handed.  Past medical history Significant for type 1 diabetes with bilateral retinopathy, photocoagulation, and vitrectomy. He's also had coronary disease. Last tetanus shot received T. dap) was last year  Review of Systems Loss of consciousness, no other injuries besides the left elbow.    Objective:   Physical Exam Examination of left elbow reveals moderate amount of swelling with underlying ecchymosis, a 3-4 cm abrasion which is red margins and a yellow eschar. Range of motion is normal with mild tenderness over the olecranon. Supracondylar area is nontender the patient has full pronation and supination.  [facility] reading (PRIMARY) by  Dr. Milus Glazier, MD:.negative      Assessment & Plan:  Contusion with abrasion left elbow  Daily silvadene topical treatment.  Signed,  Elvina Sidle, MD

## 2012-11-10 NOTE — Patient Instructions (Addendum)
Elbow Injury You have an elbow injury. X-rays and exam today do not show evidence of a fracture (broken bone). That means that only a sling or splint may be required for a brief period of time as directed by your caregiver. HOME CARE INSTRUCTIONS  Only take over-the-counter or prescription medicines for pain, discomfort, or fever as directed by your caregiver.  If you have a splint held on with an elastic wrap or a cast, watch your hand or fingers. If they become numb or cold and blue, loosen the wrap and reapply more loosely. See your caregiver if there is no relief.  You may use ice on your elbow for 15 to 20 minutes, 3 to 4 times per day, for the first 2 to 3 days.  Use your elbow as directed.  See your caregiver as directed. It is very important to keep all follow-up referrals and appointments in order to avoid any long-term problems with your elbow including chronic pain or inability to move the elbow normally. SEEK IMMEDIATE MEDICAL CARE IF:   There is swelling or increasing pain in your elbow which is not relieved with medications.  You begin to lose feeling in your hand or fingers, or develop swelling of the hand and fingers.  You get a cold or blue hand or fingers on injured side.  If your elbow remains sore, your caregiver may want to x-ray it again. A hairline fracture may not show up on the first x-rays and may only be seen on repeat x-rays ten days to two weeks later. A specialist (radiologist) may examine your x-rays at a later time. In order to get results from the radiologist or their department, make sure you know how and when you are to get that information. It is your responsibility to get results of any tests you may have had. MAKE SURE YOU:   Understand these instructions.  Will watch your condition.  Will get help right away if you are not doing well or get worse. Document Released: 09/04/2003 Document Revised: 09/06/2011 Document Reviewed: 01/31/2008 [facility]  Patient Information 2013 [place], [place].

## 2012-11-13 ENCOUNTER — Encounter (HOSPITAL_COMMUNITY)
Admission: RE | Admit: 2012-11-13 | Discharge: 2012-11-13 | Disposition: A | Discharge status: Home / Self Care | Payer: Self-pay | Source: Ambulatory Visit | Attending: Cardiovascular Disease | Admitting: Cardiovascular Disease

## 2012-11-15 ENCOUNTER — Encounter (HOSPITAL_COMMUNITY)
Admission: RE | Admit: 2012-11-15 | Discharge: 2012-11-15 | Disposition: A | Discharge status: Home / Self Care | Payer: Self-pay | Source: Ambulatory Visit | Attending: Cardiovascular Disease | Admitting: Cardiovascular Disease

## 2012-11-17 ENCOUNTER — Encounter (HOSPITAL_COMMUNITY): Payer: Self-pay

## 2012-11-21 ENCOUNTER — Other Ambulatory Visit: Payer: Self-pay | Admitting: Neurology

## 2012-11-22 ENCOUNTER — Encounter (HOSPITAL_COMMUNITY)
Admission: RE | Admit: 2012-11-22 | Discharge: 2012-11-22 | Disposition: A | Discharge status: Home / Self Care | Payer: Self-pay | Source: Ambulatory Visit | Attending: Cardiovascular Disease | Admitting: Cardiovascular Disease

## 2012-11-24 ENCOUNTER — Encounter (HOSPITAL_COMMUNITY)
Admission: RE | Admit: 2012-11-24 | Discharge: 2012-11-24 | Disposition: A | Discharge status: Home / Self Care | Payer: Self-pay | Source: Ambulatory Visit | Attending: Cardiovascular Disease | Admitting: Cardiovascular Disease

## 2012-11-24 NOTE — Progress Notes (Signed)
Jeff [name] has stopped taking his effient per Dr Excell Seltzer.  [name]'s crestor has been increased to 40 mg once a day.

## 2012-11-27 ENCOUNTER — Encounter (HOSPITAL_COMMUNITY)
Admission: RE | Admit: 2012-11-27 | Discharge: 2012-11-27 | Disposition: A | Discharge status: Home / Self Care | Payer: Self-pay | Source: Ambulatory Visit | Attending: Cardiovascular Disease | Admitting: Cardiovascular Disease

## 2012-11-27 DIAGNOSIS — Q8500 Neurofibromatosis, unspecified: Secondary | ICD-10-CM | POA: Insufficient documentation

## 2012-11-27 DIAGNOSIS — I498 Other specified cardiac arrhythmias: Secondary | ICD-10-CM | POA: Insufficient documentation

## 2012-11-27 DIAGNOSIS — Z5189 Encounter for other specified aftercare: Secondary | ICD-10-CM | POA: Insufficient documentation

## 2012-11-27 DIAGNOSIS — Z794 Long term (current) use of insulin: Secondary | ICD-10-CM | POA: Insufficient documentation

## 2012-11-27 DIAGNOSIS — I10 Essential (primary) hypertension: Secondary | ICD-10-CM | POA: Insufficient documentation

## 2012-11-27 DIAGNOSIS — E109 Type 1 diabetes mellitus without complications: Secondary | ICD-10-CM | POA: Insufficient documentation

## 2012-11-27 DIAGNOSIS — Z79899 Other long term (current) drug therapy: Secondary | ICD-10-CM | POA: Insufficient documentation

## 2012-11-27 DIAGNOSIS — F341 Dysthymic disorder: Secondary | ICD-10-CM | POA: Insufficient documentation

## 2012-11-27 DIAGNOSIS — I252 Old myocardial infarction: Secondary | ICD-10-CM | POA: Insufficient documentation

## 2012-11-27 DIAGNOSIS — E785 Hyperlipidemia, unspecified: Secondary | ICD-10-CM | POA: Insufficient documentation

## 2012-11-27 DIAGNOSIS — I2510 Atherosclerotic heart disease of native coronary artery without angina pectoris: Secondary | ICD-10-CM | POA: Insufficient documentation

## 2012-11-27 DIAGNOSIS — Z9641 Presence of insulin pump (external) (internal): Secondary | ICD-10-CM | POA: Insufficient documentation

## 2012-11-27 DIAGNOSIS — Z951 Presence of aortocoronary bypass graft: Secondary | ICD-10-CM | POA: Insufficient documentation

## 2012-11-27 DIAGNOSIS — I319 Disease of pericardium, unspecified: Secondary | ICD-10-CM | POA: Insufficient documentation

## 2012-11-27 DIAGNOSIS — G4733 Obstructive sleep apnea (adult) (pediatric): Secondary | ICD-10-CM | POA: Insufficient documentation

## 2012-11-29 ENCOUNTER — Encounter (HOSPITAL_COMMUNITY)
Admission: RE | Admit: 2012-11-29 | Discharge: 2012-11-29 | Disposition: A | Discharge status: Home / Self Care | Payer: Self-pay | Source: Ambulatory Visit | Attending: Cardiovascular Disease | Admitting: Cardiovascular Disease

## 2012-12-01 ENCOUNTER — Encounter (HOSPITAL_COMMUNITY)
Admission: RE | Admit: 2012-12-01 | Discharge: 2012-12-01 | Disposition: A | Discharge status: Home / Self Care | Payer: Self-pay | Source: Ambulatory Visit | Attending: Cardiovascular Disease | Admitting: Cardiovascular Disease

## 2012-12-04 ENCOUNTER — Encounter (HOSPITAL_COMMUNITY)
Admission: RE | Admit: 2012-12-04 | Discharge: 2012-12-04 | Disposition: A | Discharge status: Home / Self Care | Payer: Self-pay | Source: Ambulatory Visit | Attending: Cardiovascular Disease | Admitting: Cardiovascular Disease

## 2012-12-06 ENCOUNTER — Encounter (HOSPITAL_COMMUNITY)
Admission: RE | Admit: 2012-12-06 | Discharge: 2012-12-06 | Disposition: A | Discharge status: Home / Self Care | Payer: Self-pay | Source: Ambulatory Visit | Attending: Cardiovascular Disease | Admitting: Cardiovascular Disease

## 2012-12-08 ENCOUNTER — Encounter (HOSPITAL_COMMUNITY)
Admission: RE | Admit: 2012-12-08 | Discharge: 2012-12-08 | Disposition: A | Discharge status: Home / Self Care | Payer: Self-pay | Source: Ambulatory Visit | Attending: Cardiovascular Disease | Admitting: Cardiovascular Disease

## 2012-12-11 ENCOUNTER — Encounter (HOSPITAL_COMMUNITY)
Admission: RE | Admit: 2012-12-11 | Discharge: 2012-12-11 | Disposition: A | Discharge status: Home / Self Care | Payer: Self-pay | Source: Ambulatory Visit | Attending: Cardiovascular Disease | Admitting: Cardiovascular Disease

## 2012-12-13 ENCOUNTER — Encounter (HOSPITAL_COMMUNITY)
Admission: RE | Admit: 2012-12-13 | Discharge: 2012-12-13 | Disposition: A | Discharge status: Home / Self Care | Payer: Self-pay | Source: Ambulatory Visit | Attending: Cardiovascular Disease | Admitting: Cardiovascular Disease

## 2012-12-15 ENCOUNTER — Encounter (HOSPITAL_COMMUNITY)
Admission: RE | Admit: 2012-12-15 | Discharge: 2012-12-15 | Disposition: A | Discharge status: Home / Self Care | Payer: Self-pay | Source: Ambulatory Visit | Attending: Cardiovascular Disease | Admitting: Cardiovascular Disease

## 2012-12-18 ENCOUNTER — Encounter (HOSPITAL_COMMUNITY)
Admission: RE | Admit: 2012-12-18 | Discharge: 2012-12-18 | Disposition: A | Discharge status: Home / Self Care | Payer: Self-pay | Source: Ambulatory Visit | Attending: Cardiovascular Disease | Admitting: Cardiovascular Disease

## 2012-12-20 ENCOUNTER — Encounter (HOSPITAL_COMMUNITY)
Admission: RE | Admit: 2012-12-20 | Discharge: 2012-12-20 | Disposition: A | Discharge status: Home / Self Care | Payer: Self-pay | Source: Ambulatory Visit | Attending: Cardiovascular Disease | Admitting: Cardiovascular Disease

## 2012-12-22 ENCOUNTER — Encounter (HOSPITAL_COMMUNITY)
Admission: RE | Admit: 2012-12-22 | Discharge: 2012-12-22 | Disposition: A | Discharge status: Home / Self Care | Payer: Self-pay | Source: Ambulatory Visit | Attending: Cardiovascular Disease | Admitting: Cardiovascular Disease

## 2012-12-25 ENCOUNTER — Encounter (HOSPITAL_COMMUNITY)
Admission: RE | Admit: 2012-12-25 | Discharge: 2012-12-25 | Disposition: A | Discharge status: Home / Self Care | Payer: Self-pay | Source: Ambulatory Visit | Attending: Cardiovascular Disease | Admitting: Cardiovascular Disease

## 2012-12-27 ENCOUNTER — Encounter (HOSPITAL_COMMUNITY)
Admission: RE | Admit: 2012-12-27 | Discharge: 2012-12-27 | Disposition: A | Discharge status: Home / Self Care | Payer: Self-pay | Source: Ambulatory Visit | Attending: Cardiovascular Disease | Admitting: Cardiovascular Disease

## 2012-12-27 DIAGNOSIS — I2510 Atherosclerotic heart disease of native coronary artery without angina pectoris: Secondary | ICD-10-CM | POA: Insufficient documentation

## 2012-12-27 DIAGNOSIS — Z79899 Other long term (current) drug therapy: Secondary | ICD-10-CM | POA: Insufficient documentation

## 2012-12-27 DIAGNOSIS — Z5189 Encounter for other specified aftercare: Secondary | ICD-10-CM | POA: Insufficient documentation

## 2012-12-27 DIAGNOSIS — Z794 Long term (current) use of insulin: Secondary | ICD-10-CM | POA: Insufficient documentation

## 2012-12-27 DIAGNOSIS — Q8500 Neurofibromatosis, unspecified: Secondary | ICD-10-CM | POA: Insufficient documentation

## 2012-12-27 DIAGNOSIS — E785 Hyperlipidemia, unspecified: Secondary | ICD-10-CM | POA: Insufficient documentation

## 2012-12-27 DIAGNOSIS — I498 Other specified cardiac arrhythmias: Secondary | ICD-10-CM | POA: Insufficient documentation

## 2012-12-27 DIAGNOSIS — F341 Dysthymic disorder: Secondary | ICD-10-CM | POA: Insufficient documentation

## 2012-12-27 DIAGNOSIS — I319 Disease of pericardium, unspecified: Secondary | ICD-10-CM | POA: Insufficient documentation

## 2012-12-27 DIAGNOSIS — Z951 Presence of aortocoronary bypass graft: Secondary | ICD-10-CM | POA: Insufficient documentation

## 2012-12-27 DIAGNOSIS — I10 Essential (primary) hypertension: Secondary | ICD-10-CM | POA: Insufficient documentation

## 2012-12-27 DIAGNOSIS — I252 Old myocardial infarction: Secondary | ICD-10-CM | POA: Insufficient documentation

## 2012-12-27 DIAGNOSIS — E109 Type 1 diabetes mellitus without complications: Secondary | ICD-10-CM | POA: Insufficient documentation

## 2012-12-27 DIAGNOSIS — G4733 Obstructive sleep apnea (adult) (pediatric): Secondary | ICD-10-CM | POA: Insufficient documentation

## 2012-12-27 DIAGNOSIS — Z9641 Presence of insulin pump (external) (internal): Secondary | ICD-10-CM | POA: Insufficient documentation

## 2013-01-01 ENCOUNTER — Encounter (HOSPITAL_COMMUNITY)
Admission: RE | Admit: 2013-01-01 | Discharge: 2013-01-01 | Disposition: A | Discharge status: Home / Self Care | Payer: Self-pay | Source: Ambulatory Visit | Attending: Cardiovascular Disease | Admitting: Cardiovascular Disease

## 2013-01-03 ENCOUNTER — Encounter (HOSPITAL_COMMUNITY)
Admission: RE | Admit: 2013-01-03 | Discharge: 2013-01-03 | Disposition: A | Discharge status: Home / Self Care | Payer: Self-pay | Source: Ambulatory Visit | Attending: Cardiovascular Disease | Admitting: Cardiovascular Disease

## 2013-01-05 ENCOUNTER — Encounter (HOSPITAL_COMMUNITY)
Admission: RE | Admit: 2013-01-05 | Discharge: 2013-01-05 | Disposition: A | Discharge status: Home / Self Care | Payer: Self-pay | Source: Ambulatory Visit | Attending: Cardiovascular Disease | Admitting: Cardiovascular Disease

## 2013-01-08 ENCOUNTER — Encounter (HOSPITAL_COMMUNITY)
Admission: RE | Admit: 2013-01-08 | Discharge: 2013-01-08 | Disposition: A | Discharge status: Home / Self Care | Payer: Self-pay | Source: Ambulatory Visit | Attending: Cardiovascular Disease | Admitting: Cardiovascular Disease

## 2013-01-10 ENCOUNTER — Encounter (HOSPITAL_COMMUNITY)
Admission: RE | Admit: 2013-01-10 | Discharge: 2013-01-10 | Disposition: A | Discharge status: Home / Self Care | Payer: Self-pay | Source: Ambulatory Visit | Attending: Cardiovascular Disease | Admitting: Cardiovascular Disease

## 2013-01-12 ENCOUNTER — Encounter (HOSPITAL_COMMUNITY)
Admission: RE | Admit: 2013-01-12 | Discharge: 2013-01-12 | Disposition: A | Discharge status: Home / Self Care | Payer: Self-pay | Source: Ambulatory Visit | Attending: Cardiovascular Disease | Admitting: Cardiovascular Disease

## 2013-01-15 ENCOUNTER — Encounter (HOSPITAL_COMMUNITY)
Admission: RE | Admit: 2013-01-15 | Discharge: 2013-01-15 | Disposition: A | Discharge status: Home / Self Care | Payer: Self-pay | Source: Ambulatory Visit | Attending: Cardiovascular Disease | Admitting: Cardiovascular Disease

## 2013-01-17 ENCOUNTER — Encounter (HOSPITAL_COMMUNITY)
Admission: RE | Admit: 2013-01-17 | Discharge: 2013-01-17 | Disposition: A | Discharge status: Home / Self Care | Payer: Self-pay | Source: Ambulatory Visit | Attending: Cardiovascular Disease | Admitting: Cardiovascular Disease

## 2013-01-19 ENCOUNTER — Encounter (HOSPITAL_COMMUNITY)
Admission: RE | Admit: 2013-01-19 | Discharge: 2013-01-19 | Disposition: A | Discharge status: Home / Self Care | Payer: Self-pay | Source: Ambulatory Visit | Attending: Cardiovascular Disease | Admitting: Cardiovascular Disease

## 2013-01-22 ENCOUNTER — Encounter (HOSPITAL_COMMUNITY)
Admission: RE | Admit: 2013-01-22 | Discharge: 2013-01-22 | Disposition: A | Discharge status: Home / Self Care | Payer: Self-pay | Source: Ambulatory Visit | Attending: Cardiovascular Disease | Admitting: Cardiovascular Disease

## 2013-01-24 ENCOUNTER — Encounter (HOSPITAL_COMMUNITY)
Admission: RE | Admit: 2013-01-24 | Discharge: 2013-01-24 | Disposition: A | Discharge status: Home / Self Care | Payer: Self-pay | Source: Ambulatory Visit | Attending: Cardiovascular Disease | Admitting: Cardiovascular Disease

## 2013-01-26 ENCOUNTER — Encounter (HOSPITAL_COMMUNITY)
Admission: RE | Admit: 2013-01-26 | Discharge: 2013-01-26 | Disposition: A | Discharge status: Home / Self Care | Payer: Self-pay | Source: Ambulatory Visit | Attending: Cardiovascular Disease | Admitting: Cardiovascular Disease

## 2013-01-26 DIAGNOSIS — Z9641 Presence of insulin pump (external) (internal): Secondary | ICD-10-CM | POA: Insufficient documentation

## 2013-01-26 DIAGNOSIS — I498 Other specified cardiac arrhythmias: Secondary | ICD-10-CM | POA: Insufficient documentation

## 2013-01-26 DIAGNOSIS — E785 Hyperlipidemia, unspecified: Secondary | ICD-10-CM | POA: Insufficient documentation

## 2013-01-26 DIAGNOSIS — Q8500 Neurofibromatosis, unspecified: Secondary | ICD-10-CM | POA: Insufficient documentation

## 2013-01-26 DIAGNOSIS — E109 Type 1 diabetes mellitus without complications: Secondary | ICD-10-CM | POA: Insufficient documentation

## 2013-01-26 DIAGNOSIS — Z79899 Other long term (current) drug therapy: Secondary | ICD-10-CM | POA: Insufficient documentation

## 2013-01-26 DIAGNOSIS — Z794 Long term (current) use of insulin: Secondary | ICD-10-CM | POA: Insufficient documentation

## 2013-01-26 DIAGNOSIS — I319 Disease of pericardium, unspecified: Secondary | ICD-10-CM | POA: Insufficient documentation

## 2013-01-26 DIAGNOSIS — I2510 Atherosclerotic heart disease of native coronary artery without angina pectoris: Secondary | ICD-10-CM | POA: Insufficient documentation

## 2013-01-26 DIAGNOSIS — Z5189 Encounter for other specified aftercare: Secondary | ICD-10-CM | POA: Insufficient documentation

## 2013-01-26 DIAGNOSIS — I10 Essential (primary) hypertension: Secondary | ICD-10-CM | POA: Insufficient documentation

## 2013-01-26 DIAGNOSIS — I252 Old myocardial infarction: Secondary | ICD-10-CM | POA: Insufficient documentation

## 2013-01-26 DIAGNOSIS — F341 Dysthymic disorder: Secondary | ICD-10-CM | POA: Insufficient documentation

## 2013-01-26 DIAGNOSIS — G4733 Obstructive sleep apnea (adult) (pediatric): Secondary | ICD-10-CM | POA: Insufficient documentation

## 2013-01-26 DIAGNOSIS — Z951 Presence of aortocoronary bypass graft: Secondary | ICD-10-CM | POA: Insufficient documentation

## 2013-01-29 ENCOUNTER — Encounter (HOSPITAL_COMMUNITY)
Admission: RE | Admit: 2013-01-29 | Discharge: 2013-01-29 | Disposition: A | Discharge status: Home / Self Care | Payer: Self-pay | Source: Ambulatory Visit | Attending: Cardiovascular Disease | Admitting: Cardiovascular Disease

## 2013-01-31 ENCOUNTER — Encounter (HOSPITAL_COMMUNITY)
Admission: RE | Admit: 2013-01-31 | Discharge: 2013-01-31 | Disposition: A | Discharge status: Home / Self Care | Payer: Self-pay | Source: Ambulatory Visit | Attending: Cardiovascular Disease | Admitting: Cardiovascular Disease

## 2013-02-02 ENCOUNTER — Encounter (HOSPITAL_COMMUNITY)
Admission: RE | Admit: 2013-02-02 | Discharge: 2013-02-02 | Disposition: A | Discharge status: Home / Self Care | Payer: Self-pay | Source: Ambulatory Visit | Attending: Cardiovascular Disease | Admitting: Cardiovascular Disease

## 2013-02-05 ENCOUNTER — Encounter (HOSPITAL_COMMUNITY)
Admission: RE | Admit: 2013-02-05 | Discharge: 2013-02-05 | Disposition: A | Discharge status: Home / Self Care | Payer: Self-pay | Source: Ambulatory Visit | Attending: Cardiovascular Disease | Admitting: Cardiovascular Disease

## 2013-02-07 ENCOUNTER — Encounter (HOSPITAL_COMMUNITY)
Admission: RE | Admit: 2013-02-07 | Discharge: 2013-02-07 | Disposition: A | Discharge status: Home / Self Care | Payer: Self-pay | Source: Ambulatory Visit | Attending: Cardiovascular Disease | Admitting: Cardiovascular Disease

## 2013-02-09 ENCOUNTER — Encounter (HOSPITAL_COMMUNITY)
Admission: RE | Admit: 2013-02-09 | Discharge: 2013-02-09 | Disposition: A | Discharge status: Home / Self Care | Payer: Self-pay | Source: Ambulatory Visit | Attending: Cardiovascular Disease | Admitting: Cardiovascular Disease

## 2013-02-12 ENCOUNTER — Encounter (HOSPITAL_COMMUNITY)
Admission: RE | Admit: 2013-02-12 | Discharge: 2013-02-12 | Disposition: A | Discharge status: Home / Self Care | Payer: Self-pay | Source: Ambulatory Visit | Attending: Cardiovascular Disease | Admitting: Cardiovascular Disease

## 2013-02-14 ENCOUNTER — Encounter (HOSPITAL_COMMUNITY)
Admission: RE | Admit: 2013-02-14 | Discharge: 2013-02-14 | Disposition: A | Discharge status: Home / Self Care | Payer: Self-pay | Source: Ambulatory Visit | Attending: Cardiovascular Disease | Admitting: Cardiovascular Disease

## 2013-02-14 NOTE — Progress Notes (Signed)
Jeff [name] presented to cardiac rehab today voicing since Sunday evening he has noticed a discomfort in his right chest when he is passive.  Pain is not noticeable when he is busy and engaged in an activity.  He rates discomfort .5 to 1 on pain scale.  Heart rate 78, blood pressure 108/70, respirations 16.  He exercised 5 minutes before discussing discomfort with me.  Exercise stopped, called Dr. Earmon Phoenix office and patient was given an appointment with Dr. Excell Seltzer on February 16, 2013 @ 11:30.  Instructed to go to the emergency room if pain increases and/or is constant.

## 2013-02-16 ENCOUNTER — Ambulatory Visit (INDEPENDENT_AMBULATORY_CARE_PROVIDER_SITE_OTHER): Payer: Medicare Other | Admitting: Cardiovascular Disease

## 2013-02-16 ENCOUNTER — Encounter (HOSPITAL_COMMUNITY): Admission: RE | Admit: 2013-02-16 | Payer: Self-pay | Source: Ambulatory Visit

## 2013-02-16 ENCOUNTER — Encounter: Payer: Self-pay | Admitting: Cardiovascular Disease

## 2013-02-16 VITALS — BP 110/74 | HR 80 | Ht 71.0 in | Wt 237.0 lb

## 2013-02-16 DIAGNOSIS — I2510 Atherosclerotic heart disease of native coronary artery without angina pectoris: Secondary | ICD-10-CM

## 2013-02-16 DIAGNOSIS — R079 Chest pain, unspecified: Secondary | ICD-10-CM

## 2013-02-16 LAB — BASIC METABOLIC PANEL
BUN: 15 mg/dL (ref 6–23)
CO2: 25 mEq/L (ref 19–32)
Calcium: 8.8 mg/dL (ref 8.4–10.5)
Creatinine, Ser: 0.8 mg/dL (ref 0.4–1.5)
Glucose, Bld: 219 mg/dL — ABNORMAL HIGH (ref 70–99)

## 2013-02-16 LAB — CBC WITH DIFFERENTIAL/PLATELET
Basophils Absolute: 0 10*3/uL (ref 0.0–0.1)
Eosinophils Absolute: 0.2 10*3/uL (ref 0.0–0.7)
Lymphocytes Relative: 20.3 % (ref 12.0–46.0)
MCHC: 34.4 g/dL (ref 30.0–36.0)
Neutrophils Relative %: 64.2 % (ref 43.0–77.0)
Platelets: 206 10*3/uL (ref 150.0–400.0)
RDW: 12.6 % (ref 11.5–14.6)

## 2013-02-16 LAB — PROTIME-INR: INR: 1.1 ratio — ABNORMAL HIGH (ref 0.8–1.0)

## 2013-02-16 NOTE — Patient Instructions (Addendum)
Your physician has requested that you have a cardiac catheterization. Cardiac catheterization is used to diagnose and/or treat various heart conditions. Doctors may recommend this procedure for a number of different reasons. The most common reason is to evaluate chest pain. Chest pain can be a symptom of coronary artery disease (CAD), and cardiac catheterization can show whether plaque is narrowing or blocking your heart's arteries. This procedure is also used to evaluate the valves, as well as measure the blood flow and oxygen levels in different parts of your heart. For further information please visit [email or web address]. Please follow instruction sheet, as given.  Your physician recommends that you have lab work today: BMP, CBC and PT/INR   

## 2013-02-16 NOTE — Progress Notes (Signed)
 HPI:   47-year-old gentleman presenting for followup evaluation. He's followed for coronary artery disease with history of CABG and early bypass graft failure. He was treated with multivessel PCI following CABG. He presents today for evaluation of chest pain.  The patient was at cardiac rehabilitation and described chest discomfort at rest that became worse with exertion. Pain was in the right chest but later moved to the left chest. There was no radiation to the back or shoulders. However, his pain did radiate up to both sides of the neck. There was no associated dyspnea. He does complain of increased diaphoresis. No orthopnea, PND, or leg swelling. He has been experiencing more chest discomfort at home.  Outpatient Encounter Prescriptions as of 02/16/2013  Medication Sig Dispense Refill  . aspirin 81 MG tablet Take 81 mg by mouth daily.        . docusate sodium (COLACE) 100 MG capsule Take 100 mg by mouth daily.        . ezetimibe (ZETIA) 10 MG tablet Take 10 mg by mouth at bedtime.       . Insulin Human (INSULIN PUMP) 100 unit/ml SOLN Inject into the skin continuous. Novolog Insulin Basal Rate as follows: 12am-1.1 units, 6am-1.9 units, 10am-2.0 units, 12pm-2.2 units & 6pm-2.1 units      . isosorbide mononitrate (IMDUR) 60 MG 24 hr tablet Take 60 mg by mouth daily.      . Lancets (ONETOUCH ULTRASOFT) lancets       . levETIRAcetam (KEPPRA) 500 MG tablet take 1/2 tablet by mouth every morning and 1 tablet every evening  45 tablet  6  . lisinopril (PRINIVIL,ZESTRIL) 10 MG tablet Take 5 mg by mouth daily.       . metoprolol tartrate (LOPRESSOR) 25 MG tablet Take 25 mg by mouth 2 (two) times daily.      . multivitamin (ONE-A-DAY MEN'S) TABS Take 1 tablet by mouth daily.       . nitroGLYCERIN (NITROSTAT) 0.4 MG SL tablet Place 1 tablet (0.4 mg total) under the tongue every 5 (five) minutes as needed.  25 tablet  1  . NOVOLOG 100 UNIT/ML injection       . ONE TOUCH ULTRA TEST test strip       .  rosuvastatin (CRESTOR) 40 MG tablet Take 40 mg by mouth daily.      . sertraline (ZOLOFT) 100 MG tablet Take 100 mg by mouth at bedtime. One and half tablet at bedtime      . silver sulfADIAZINE (SILVADENE) 1 % cream Apply topically daily.  60 g  0  . TEGRETOL-XR 200 MG 12 hr tablet take 3 tablets by mouth twice a day  180 tablet  6  . vitamin B-12 (CYANOCOBALAMIN) 500 MCG tablet Take 500 mcg by mouth daily.      . [DISCONTINUED] prasugrel (EFFIENT) 10 MG TABS Take 10 mg by mouth daily.      . [DISCONTINUED] rosuvastatin (CRESTOR) 20 MG tablet Take 40 mg by mouth at bedtime.        No facility-administered encounter medications on file as of 02/16/2013.    Allergies  Allergen Reactions  . Latex Rash    Past Medical History  Diagnosis Date  . CAD (coronary artery disease)     Cath 2011.  LAD stent patent, distal LAD occlusion, D1 100%, OM branch 80 - 90%, SVG to diag patent, LIMA to LAD occluded, SVG to RCA occluded, SVG to OM was occluded.   No change from   previous cath.  Managed medically.   . Diabetes mellitus type I     on insulin pump at  home  . Sleep apnea     s/p oral surgery  . Hyperlipidemia   . 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 Diabetic retinopathy     s/p vitrectomy and history of retinal surgery  . HTN (hypertension)   . Seizure disorder   . Seasonal allergic rhinitis   . Depression   . Cataract   . Hemorrhoid   . Myocardial infarction   . Anxiety     ROS: Negative except as per HPI  BP 110/74  Pulse 80  Ht 5' 11" (1.803 m)  Wt 237 lb (107.502 kg)  BMI 33.07 kg/m2  SpO2 96%  PHYSICAL EXAM: Pt is alert and oriented, NAD HEENT: normal Neck: JVP - normal, carotids 2+= without bruits Lungs: CTA bilaterally CV: RRR without murmur or gallop Abd: soft, NT, Positive BS, no hepatomegaly Ext: trace pretibial edema bilaterally, distal pulses intact and equal Skin: warm/dry no rash  EKG:  Sinus rhythm 80 beats per  minute, rightward axis, possible age indeterminate inferior infarct. No significant change from previous tracing.  ASSESSMENT AND PLAN: 1. Coronary artery disease, native vessel. The patient has progressive symptoms of angina, now CCS class 2-3. His symptoms have some typical and some atypical features. He's had a lot of chest pain over the past few years and his symptoms are difficult for me to sort out. He has extensive coronary disease with early graft failure followed by multivessel PCI. An exercise treadmill study last year demonstrated ischemic findings and we treated him medically with escalation of his antianginal program at that time. However, with worsening of his anginal symptoms on optimal medical therapy, I think definitive evaluation is indicated. I have reviewed the risks, indications, and alternatives to cardiac catheterization and possible PCI with the patient and his wife.  2. Hyperlipidemia. He is treated with high-dose Crestor and zetia. Followed by Dr. Perini.  Jeff [name] 02/16/2013 5:56 PM      

## 2013-02-19 ENCOUNTER — Encounter (HOSPITAL_COMMUNITY): Payer: Self-pay

## 2013-02-20 ENCOUNTER — Encounter (HOSPITAL_COMMUNITY): Payer: Self-pay | Admitting: Pharmacy Technician

## 2013-02-21 ENCOUNTER — Encounter (HOSPITAL_COMMUNITY): Payer: Self-pay

## 2013-02-21 ENCOUNTER — Other Ambulatory Visit: Payer: Self-pay | Admitting: Cardiovascular Disease

## 2013-02-21 DIAGNOSIS — I2510 Atherosclerotic heart disease of native coronary artery without angina pectoris: Secondary | ICD-10-CM

## 2013-02-22 ENCOUNTER — Encounter (HOSPITAL_COMMUNITY)
Admission: RE | Disposition: A | Discharge status: Home / Self Care | Payer: Self-pay | Source: Ambulatory Visit | Attending: Cardiovascular Disease

## 2013-02-22 ENCOUNTER — Ambulatory Visit (HOSPITAL_COMMUNITY)
Admission: RE | Admit: 2013-02-22 | Discharge: 2013-02-22 | Disposition: A | Discharge status: Home / Self Care | Payer: Medicare Other | Source: Ambulatory Visit | Attending: Cardiovascular Disease | Admitting: Cardiovascular Disease

## 2013-02-22 DIAGNOSIS — E1039 Type 1 diabetes mellitus with other diabetic ophthalmic complication: Secondary | ICD-10-CM | POA: Insufficient documentation

## 2013-02-22 DIAGNOSIS — Z79899 Other long term (current) drug therapy: Secondary | ICD-10-CM | POA: Insufficient documentation

## 2013-02-22 DIAGNOSIS — Z9861 Coronary angioplasty status: Secondary | ICD-10-CM | POA: Insufficient documentation

## 2013-02-22 DIAGNOSIS — F3289 Other specified depressive episodes: Secondary | ICD-10-CM | POA: Insufficient documentation

## 2013-02-22 DIAGNOSIS — G4730 Sleep apnea, unspecified: Secondary | ICD-10-CM | POA: Insufficient documentation

## 2013-02-22 DIAGNOSIS — I10 Essential (primary) hypertension: Secondary | ICD-10-CM | POA: Insufficient documentation

## 2013-02-22 DIAGNOSIS — G40909 Epilepsy, unspecified, not intractable, without status epilepticus: Secondary | ICD-10-CM | POA: Insufficient documentation

## 2013-02-22 DIAGNOSIS — I2582 Chronic total occlusion of coronary artery: Secondary | ICD-10-CM | POA: Insufficient documentation

## 2013-02-22 DIAGNOSIS — I209 Angina pectoris, unspecified: Secondary | ICD-10-CM | POA: Insufficient documentation

## 2013-02-22 DIAGNOSIS — I2510 Atherosclerotic heart disease of native coronary artery without angina pectoris: Secondary | ICD-10-CM

## 2013-02-22 DIAGNOSIS — Z7982 Long term (current) use of aspirin: Secondary | ICD-10-CM | POA: Insufficient documentation

## 2013-02-22 DIAGNOSIS — I252 Old myocardial infarction: Secondary | ICD-10-CM | POA: Insufficient documentation

## 2013-02-22 DIAGNOSIS — E785 Hyperlipidemia, unspecified: Secondary | ICD-10-CM | POA: Insufficient documentation

## 2013-02-22 DIAGNOSIS — F411 Generalized anxiety disorder: Secondary | ICD-10-CM | POA: Insufficient documentation

## 2013-02-22 DIAGNOSIS — I25810 Atherosclerosis of coronary artery bypass graft(s) without angina pectoris: Secondary | ICD-10-CM | POA: Insufficient documentation

## 2013-02-22 DIAGNOSIS — Q8500 Neurofibromatosis, unspecified: Secondary | ICD-10-CM | POA: Insufficient documentation

## 2013-02-22 DIAGNOSIS — Z91040 Latex allergy status: Secondary | ICD-10-CM | POA: Insufficient documentation

## 2013-02-22 DIAGNOSIS — F329 Major depressive disorder, single episode, unspecified: Secondary | ICD-10-CM | POA: Insufficient documentation

## 2013-02-22 DIAGNOSIS — E11319 Type 2 diabetes mellitus with unspecified diabetic retinopathy without macular edema: Secondary | ICD-10-CM | POA: Insufficient documentation

## 2013-02-22 DIAGNOSIS — Z9641 Presence of insulin pump (external) (internal): Secondary | ICD-10-CM | POA: Insufficient documentation

## 2013-02-22 HISTORY — PX: LEFT HEART CATHETERIZATION WITH CORONARY/GRAFT ANGIOGRAM: SHX5450

## 2013-02-22 LAB — GLUCOSE, CAPILLARY
Glucose-Capillary: 337 mg/dL — ABNORMAL HIGH (ref 70–99)
Glucose-Capillary: 343 mg/dL — ABNORMAL HIGH (ref 70–99)
Glucose-Capillary: 249 mg/dL — ABNORMAL HIGH (ref 70–99)

## 2013-02-22 SURGERY — LEFT HEART CATHETERIZATION WITH CORONARY/GRAFT ANGIOGRAM
Anesthesia: LOCAL

## 2013-02-22 MED ORDER — SODIUM CHLORIDE 0.9 % IV SOLN: 250.0000 mL | INTRAVENOUS | Status: DC | PRN

## 2013-02-22 MED ORDER — DIAZEPAM 5 MG PO TABS
ORAL_TABLET | ORAL | Status: AC
  Filled 2013-02-22: qty 1

## 2013-02-22 MED ORDER — ACETAMINOPHEN 325 MG PO TABS: 650.0000 mg | ORAL_TABLET | ORAL | Status: DC | PRN

## 2013-02-22 MED ORDER — LIDOCAINE HCL (PF) 1 % IJ SOLN
INTRAMUSCULAR | Status: AC
  Filled 2013-02-22: qty 30

## 2013-02-22 MED ORDER — HEPARIN (PORCINE) IN NACL 2-0.9 UNIT/ML-% IJ SOLN
INTRAMUSCULAR | Status: AC
  Filled 2013-02-22: qty 1000

## 2013-02-22 MED ORDER — MIDAZOLAM HCL 2 MG/2ML IJ SOLN
INTRAMUSCULAR | Status: AC
  Filled 2013-02-22: qty 2

## 2013-02-22 MED ORDER — SODIUM CHLORIDE 0.9 % IV SOLN
INTRAVENOUS | Status: DC
  Administered 2013-02-22: 07:00:00 via INTRAVENOUS

## 2013-02-22 MED ORDER — FENTANYL CITRATE 0.05 MG/ML IJ SOLN
INTRAMUSCULAR | Status: AC
Start: 2013-02-22 — End: 2013-02-22
  Filled 2013-02-22: qty 2

## 2013-02-22 MED ORDER — SODIUM CHLORIDE 0.9 % IJ SOLN: 3.0000 mL | INTRAMUSCULAR | Status: DC | PRN

## 2013-02-22 MED ORDER — SODIUM CHLORIDE 0.9 % IV SOLN: 1.0000 mL/kg/h | INTRAVENOUS | Status: DC

## 2013-02-22 MED ORDER — SODIUM CHLORIDE 0.9 % IJ SOLN
3.0000 mL | Freq: Two times a day (BID) | INTRAMUSCULAR | Status: DC
  Administered 2013-02-22: 3 mL via INTRAVENOUS

## 2013-02-22 MED ORDER — DIAZEPAM 5 MG PO TABS
5.0000 mg | ORAL_TABLET | ORAL | Status: AC
  Administered 2013-02-22: 5 mg via ORAL

## 2013-02-22 MED ORDER — ONDANSETRON HCL 4 MG/2ML IJ SOLN: 4.0000 mg | Freq: Four times a day (QID) | INTRAMUSCULAR | Status: DC | PRN

## 2013-02-22 MED ORDER — NITROGLYCERIN 0.2 MG/ML ON CALL CATH LAB
INTRAVENOUS | Status: AC
  Filled 2013-02-22: qty 1

## 2013-02-22 NOTE — Interval H&P Note (Signed)
History and Physical Interval Note:  02/22/2013 9:08 AM  [name]  has presented today for surgery, with the diagnosis of Chest pain  The various methods of treatment have been discussed with the patient and family. After consideration of risks, benefits and other options for treatment, the patient has consented to  Procedure(s): LEFT HEART CATHETERIZATION WITH CORONARY/GRAFT ANGIOGRAM (N/A) as a surgical intervention .  The patient's history has been reviewed, patient examined, no change in status, stable for surgery.  I have reviewed the patient's chart and labs.  Questions were answered to the patient's satisfaction.     Tonny Bollman

## 2013-02-22 NOTE — CV Procedure (Signed)
Cardiac Catheterization Procedure Note  Name: Jeff [name] MRN: [id number] DOB: Nov 27, 1965  Procedure: Left Heart Cath, Selective Coronary Angiography, SVG angiography, LV angiography  Indication: 47 year old gentleman with extensive coronary atherosclerosis. He has undergone multivessel CABG with early graft failure. He's also undergone multivessel PCI in 2011. His medical therapy has been optimal with good control of his diabetes, hyperlipidemia, and hypertension. He's had progressive anginal symptoms, CCS class III on optimal medical therapy and presents today for cardiac catheterization.   Procedural details: The right groin was prepped, draped, and anesthetized with 1% lidocaine. Using modified Seldinger technique, a 5 French sheath was introduced into the right femoral artery. Standard Judkins catheters were used for coronary angiography and left ventriculography. The JR 4 catheter was used for angiography of the saphenous vein graft to diagonal. The other vein grafts and the LIMA graft are known to be occluded and they were not selectively imaged. Catheter exchanges were performed over a guidewire. There were no immediate procedural complications. The patient was transferred to the post catheterization recovery area for further monitoring.  Procedural Findings: Hemodynamics:  AO 115/64, mean 86 LV 115/12   Coronary angiography: Coronary dominance: right  Left mainstem: Widely patent without obstructive disease. Arises from the left cusp it divides into the LAD and left circumflex.  Left anterior descending (LAD): The LAD is patent to the distal anterior wall. The proximal LAD has diffuse 30% stenosis. The stented segment in the proximal to mid LAD is widely patent without significant restenosis. The diagonal branch is occluded at its origin and it fills from saphenous vein graft flow. The stented segment in the distal LAD is widely patent without significant restenosis. The apical  LAD is chronically occluded and unchanged from the previous study.  Left circumflex (LCx): The left circumflex is patent. The proximal AV circumflex is patent without significant stenosis. The first obtuse marginal branch has diffuse disease in a small vessel pattern, primarily in the distal subbranch with 95% stenosis present. This vessel is about 1-1.5 mm in diameter. There is a left posterolateral branch that is patent throughout its course.  Right coronary artery (RCA): The RCA is diffusely diseased. There is diffuse 60-70% stenosis throughout the proximal and mid portions of the vessel. The distal vessel is occluded and fills from left to right collaterals. The entire RCA is small in caliber.  Saphenous vein graft to diagonal is widely patent. There is no significant stenosis present. The graft is improved in appearance compared to the 2011 study.  Left ventriculography: Left ventricular systolic function is normal, LVEF is estimated at 55-65%, there is no significant mitral regurgitation   Final Conclusions:   1. Severe 3 vessel coronary artery disease primarily with a distal small vessel disease pattern as detailed above with total occlusion of the apical LAD, small vessel disease of the left circumflex subbranches, and total occlusion of the distal RCA.  2. Continued patency of the stented segments in the proximal and distal LAD  3. Continued patency of the saphenous vein graft to diagonal branch  4. Preserved left ventricular systolic function.  Recommendations: Overall his coronary status is stable. He has excellent patency of his previous stent sites in his angioplasty site in the diagonal vein graft. I suspect he will continue to have some lifestyle limiting angina based on his extensive small vessel and distal vessel disease. Will try to optimize his antianginal program, but systemic blood pressure and low normal left ventricular end-diastolic pressure may limit therapies.  Casimiro Needle  Tyia Binford 02/22/2013, 8:36 AM

## 2013-02-22 NOTE — Progress Notes (Signed)
Pt. Bolus self with 4.2 units of Novalog per order

## 2013-02-22 NOTE — Interval H&P Note (Signed)
History and Physical Interval Note:  02/22/2013 8:00 AM  Jeff [name]  has presented today for surgery, with the diagnosis of Chest pain  The various methods of treatment have been discussed with the patient and family. After consideration of risks, benefits and other options for treatment, the patient has consented to  Procedure(s): LEFT HEART CATHETERIZATION WITH CORONARY/GRAFT ANGIOGRAM (N/A) as a surgical intervention .  The patient's history has been reviewed, patient examined, no change in status, stable for surgery.  I have reviewed the patient's chart and labs.  Questions were answered to the patient's satisfaction.    Cath Lab Visit (complete for each Cath Lab visit)  Clinical Evaluation Leading to the Procedure:   ACS: no  Non-ACS:    Anginal Classification: CCS III  Anti-ischemic medical therapy: Maximal Therapy (2 or more classes of medications)  Non-Invasive Test Results: No non-invasive testing performed  Prior CABG: Previous CABG         Tonny Bollman

## 2013-02-22 NOTE — H&P (View-Only) (Signed)
HPI:   47 year old gentleman presenting for followup evaluation. He's followed for coronary artery disease with history of CABG and early bypass graft failure. He was treated with multivessel PCI following CABG. He presents today for evaluation of chest pain.  The patient was at cardiac rehabilitation and described chest discomfort at rest that became worse with exertion. Pain was in the right chest but later moved to the left chest. There was no radiation to the back or shoulders. However, his pain did radiate up to both sides of the neck. There was no associated dyspnea. He does complain of increased diaphoresis. No orthopnea, PND, or leg swelling. He has been experiencing more chest discomfort at home.  Outpatient Encounter Prescriptions as of 02/16/2013  Medication Sig Dispense Refill  . aspirin 81 MG tablet Take 81 mg by mouth daily.        [name] docusate sodium (COLACE) 100 MG capsule Take 100 mg by mouth daily.        [name] ezetimibe (ZETIA) 10 MG tablet Take 10 mg by mouth at bedtime.       . Insulin Human (INSULIN PUMP) 100 unit/ml SOLN Inject into the skin continuous. Novolog Insulin Basal Rate as follows: 12am-1.1 units, 6am-1.9 units, 10am-2.0 units, 12pm-2.2 units & 6pm-2.1 units      . isosorbide mononitrate (IMDUR) 60 MG 24 hr tablet Take 60 mg by mouth daily.      . Lancets (ONETOUCH ULTRASOFT) lancets       . levETIRAcetam (KEPPRA) 500 MG tablet take 1/2 tablet by mouth every morning and 1 tablet every evening  45 tablet  6  . lisinopril (PRINIVIL,ZESTRIL) 10 MG tablet Take 5 mg by mouth daily.       . metoprolol tartrate (LOPRESSOR) 25 MG tablet Take 25 mg by mouth 2 (two) times daily.      . multivitamin (ONE-A-DAY MEN'S) TABS Take 1 tablet by mouth daily.       . nitroGLYCERIN (NITROSTAT) 0.4 MG SL tablet Place 1 tablet (0.4 mg total) under the tongue every 5 (five) minutes as needed.  25 tablet  1  . NOVOLOG 100 UNIT/ML injection       . ONE TOUCH ULTRA TEST test strip       .  rosuvastatin (CRESTOR) 40 MG tablet Take 40 mg by mouth daily.      . sertraline (ZOLOFT) 100 MG tablet Take 100 mg by mouth at bedtime. One and half tablet at bedtime      . silver sulfADIAZINE (SILVADENE) 1 % cream Apply topically daily.  60 g  0  . TEGRETOL-XR 200 MG 12 hr tablet take 3 tablets by mouth twice a day  180 tablet  6  . vitamin B-12 (CYANOCOBALAMIN) 500 MCG tablet Take 500 mcg by mouth daily.      . [DISCONTINUED] prasugrel (EFFIENT) 10 MG TABS Take 10 mg by mouth daily.      . [DISCONTINUED] rosuvastatin (CRESTOR) 20 MG tablet Take 40 mg by mouth at bedtime.        No facility-administered encounter medications on file as of 02/16/2013.    Allergies  Allergen Reactions  . Latex Rash    Past Medical History  Diagnosis Date  . CAD (coronary artery disease)     Cath 2011.  LAD stent patent, distal LAD occlusion, D1 100%, OM branch 80 - 90%, SVG to diag patent, LIMA to LAD occluded, SVG to RCA occluded, SVG to OM was occluded.   No change from  previous cath.  Managed medically.   . Diabetes mellitus type I     on insulin pump at  home  . Sleep apnea     s/p oral surgery  . Hyperlipidemia   . 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 Diabetic retinopathy     s/p vitrectomy and history of retinal surgery  . HTN (hypertension)   . Seizure disorder   . Seasonal allergic rhinitis   . Depression   . Cataract   . Hemorrhoid   . Myocardial infarction   . Anxiety     ROS: Negative except as per HPI  BP 110/74  Pulse 80  Ht 5\' 11"  (1.803 m)  Wt 237 lb (107.502 kg)  BMI 33.07 kg/m2  SpO2 96%  PHYSICAL EXAM: Pt is alert and oriented, NAD HEENT: normal Neck: JVP - normal, carotids 2+= without bruits Lungs: CTA bilaterally CV: RRR without murmur or gallop Abd: soft, NT, Positive BS, no hepatomegaly Ext: trace pretibial edema bilaterally, distal pulses intact and equal Skin: warm/dry no rash  EKG:  Sinus rhythm 80 beats per  minute, rightward axis, possible age indeterminate inferior infarct. No significant change from previous tracing.  ASSESSMENT AND PLAN: 1. Coronary artery disease, native vessel. The patient has progressive symptoms of angina, now CCS class 2-3. His symptoms have some typical and some atypical features. He's had a lot of chest pain over the past few years and his symptoms are difficult for me to sort out. He has extensive coronary disease with early graft failure followed by multivessel PCI. An exercise treadmill study last year demonstrated ischemic findings and we treated him medically with escalation of his antianginal program at that time. However, with worsening of his anginal symptoms on optimal medical therapy, I think definitive evaluation is indicated. I have reviewed the risks, indications, and alternatives to cardiac catheterization and possible PCI with the patient and his wife.  2. Hyperlipidemia. He is treated with high-dose Crestor and zetia. Followed by Dr. Waynard Edwards.  Tonny Bollman 02/16/2013 5:56 PM

## 2013-02-23 ENCOUNTER — Telehealth: Payer: Self-pay | Admitting: Cardiovascular Disease

## 2013-02-23 ENCOUNTER — Encounter (HOSPITAL_COMMUNITY): Payer: Self-pay

## 2013-02-23 NOTE — Telephone Encounter (Signed)
New Problem  Pt states he did not get any written instructions for his cath// when can he return to cardiac rehab.

## 2013-02-23 NOTE — Telephone Encounter (Signed)
Pt can return to cardiac rehab next week per Dr Excell Seltzer. Pt aware.

## 2013-02-28 ENCOUNTER — Encounter (HOSPITAL_COMMUNITY)
Admission: RE | Admit: 2013-02-28 | Discharge: 2013-02-28 | Disposition: A | Discharge status: Home / Self Care | Payer: Self-pay | Source: Ambulatory Visit | Attending: Cardiovascular Disease | Admitting: Cardiovascular Disease

## 2013-02-28 DIAGNOSIS — Z9641 Presence of insulin pump (external) (internal): Secondary | ICD-10-CM | POA: Insufficient documentation

## 2013-02-28 DIAGNOSIS — Z951 Presence of aortocoronary bypass graft: Secondary | ICD-10-CM | POA: Insufficient documentation

## 2013-02-28 DIAGNOSIS — I252 Old myocardial infarction: Secondary | ICD-10-CM | POA: Insufficient documentation

## 2013-02-28 DIAGNOSIS — Z5189 Encounter for other specified aftercare: Secondary | ICD-10-CM | POA: Insufficient documentation

## 2013-02-28 DIAGNOSIS — I319 Disease of pericardium, unspecified: Secondary | ICD-10-CM | POA: Insufficient documentation

## 2013-02-28 DIAGNOSIS — Z79899 Other long term (current) drug therapy: Secondary | ICD-10-CM | POA: Insufficient documentation

## 2013-02-28 DIAGNOSIS — F341 Dysthymic disorder: Secondary | ICD-10-CM | POA: Insufficient documentation

## 2013-02-28 DIAGNOSIS — Q8500 Neurofibromatosis, unspecified: Secondary | ICD-10-CM | POA: Insufficient documentation

## 2013-02-28 DIAGNOSIS — I498 Other specified cardiac arrhythmias: Secondary | ICD-10-CM | POA: Insufficient documentation

## 2013-02-28 DIAGNOSIS — I10 Essential (primary) hypertension: Secondary | ICD-10-CM | POA: Insufficient documentation

## 2013-02-28 DIAGNOSIS — G4733 Obstructive sleep apnea (adult) (pediatric): Secondary | ICD-10-CM | POA: Insufficient documentation

## 2013-02-28 DIAGNOSIS — E109 Type 1 diabetes mellitus without complications: Secondary | ICD-10-CM | POA: Insufficient documentation

## 2013-02-28 DIAGNOSIS — Z794 Long term (current) use of insulin: Secondary | ICD-10-CM | POA: Insufficient documentation

## 2013-02-28 DIAGNOSIS — E785 Hyperlipidemia, unspecified: Secondary | ICD-10-CM | POA: Insufficient documentation

## 2013-02-28 DIAGNOSIS — I2510 Atherosclerotic heart disease of native coronary artery without angina pectoris: Secondary | ICD-10-CM | POA: Insufficient documentation

## 2013-03-02 ENCOUNTER — Encounter (HOSPITAL_COMMUNITY)
Admission: RE | Admit: 2013-03-02 | Discharge: 2013-03-02 | Disposition: A | Discharge status: Home / Self Care | Payer: Self-pay | Source: Ambulatory Visit | Attending: Cardiovascular Disease | Admitting: Cardiovascular Disease

## 2013-03-05 ENCOUNTER — Encounter (HOSPITAL_COMMUNITY)
Admission: RE | Admit: 2013-03-05 | Discharge: 2013-03-05 | Disposition: A | Discharge status: Home / Self Care | Payer: Self-pay | Source: Ambulatory Visit | Attending: Cardiovascular Disease | Admitting: Cardiovascular Disease

## 2013-03-07 ENCOUNTER — Encounter (HOSPITAL_COMMUNITY)
Admission: RE | Admit: 2013-03-07 | Discharge: 2013-03-07 | Disposition: A | Discharge status: Home / Self Care | Payer: Self-pay | Source: Ambulatory Visit | Attending: Cardiovascular Disease | Admitting: Cardiovascular Disease

## 2013-03-09 ENCOUNTER — Encounter (HOSPITAL_COMMUNITY)
Admission: RE | Admit: 2013-03-09 | Discharge: 2013-03-09 | Disposition: A | Discharge status: Home / Self Care | Payer: Self-pay | Source: Ambulatory Visit | Attending: Cardiovascular Disease | Admitting: Cardiovascular Disease

## 2013-03-12 ENCOUNTER — Encounter (HOSPITAL_COMMUNITY)
Admission: RE | Admit: 2013-03-12 | Discharge: 2013-03-12 | Disposition: A | Discharge status: Home / Self Care | Payer: Self-pay | Source: Ambulatory Visit | Attending: Cardiovascular Disease | Admitting: Cardiovascular Disease

## 2013-03-14 ENCOUNTER — Encounter (HOSPITAL_COMMUNITY): Payer: Medicare Other

## 2013-03-16 ENCOUNTER — Encounter (HOSPITAL_COMMUNITY)
Admission: RE | Admit: 2013-03-16 | Discharge: 2013-03-16 | Disposition: A | Discharge status: Home / Self Care | Payer: Self-pay | Source: Ambulatory Visit | Attending: Cardiovascular Disease | Admitting: Cardiovascular Disease

## 2013-03-19 ENCOUNTER — Encounter (HOSPITAL_COMMUNITY)
Admission: RE | Admit: 2013-03-19 | Discharge: 2013-03-19 | Disposition: A | Discharge status: Home / Self Care | Payer: Self-pay | Source: Ambulatory Visit | Attending: Cardiovascular Disease | Admitting: Cardiovascular Disease

## 2013-03-21 ENCOUNTER — Encounter (HOSPITAL_COMMUNITY)
Admission: RE | Admit: 2013-03-21 | Discharge: 2013-03-21 | Disposition: A | Discharge status: Home / Self Care | Payer: Self-pay | Source: Ambulatory Visit | Attending: Cardiovascular Disease | Admitting: Cardiovascular Disease

## 2013-03-23 ENCOUNTER — Encounter (HOSPITAL_COMMUNITY)
Admission: RE | Admit: 2013-03-23 | Discharge: 2013-03-23 | Disposition: A | Discharge status: Home / Self Care | Payer: Self-pay | Source: Ambulatory Visit | Attending: Cardiovascular Disease | Admitting: Cardiovascular Disease

## 2013-03-25 ENCOUNTER — Other Ambulatory Visit: Payer: Self-pay | Admitting: Cardiovascular Disease

## 2013-03-26 ENCOUNTER — Encounter (HOSPITAL_COMMUNITY)
Admission: RE | Admit: 2013-03-26 | Discharge: 2013-03-26 | Disposition: A | Discharge status: Home / Self Care | Payer: Self-pay | Source: Ambulatory Visit | Attending: Cardiovascular Disease | Admitting: Cardiovascular Disease

## 2013-03-28 ENCOUNTER — Encounter (HOSPITAL_COMMUNITY)
Admission: RE | Admit: 2013-03-28 | Discharge: 2013-03-28 | Disposition: A | Discharge status: Home / Self Care | Payer: Self-pay | Source: Ambulatory Visit | Attending: Cardiovascular Disease | Admitting: Cardiovascular Disease

## 2013-03-28 DIAGNOSIS — I498 Other specified cardiac arrhythmias: Secondary | ICD-10-CM | POA: Insufficient documentation

## 2013-03-28 DIAGNOSIS — E109 Type 1 diabetes mellitus without complications: Secondary | ICD-10-CM | POA: Insufficient documentation

## 2013-03-28 DIAGNOSIS — I2510 Atherosclerotic heart disease of native coronary artery without angina pectoris: Secondary | ICD-10-CM | POA: Insufficient documentation

## 2013-03-28 DIAGNOSIS — F341 Dysthymic disorder: Secondary | ICD-10-CM | POA: Insufficient documentation

## 2013-03-28 DIAGNOSIS — I319 Disease of pericardium, unspecified: Secondary | ICD-10-CM | POA: Insufficient documentation

## 2013-03-28 DIAGNOSIS — Q8500 Neurofibromatosis, unspecified: Secondary | ICD-10-CM | POA: Insufficient documentation

## 2013-03-28 DIAGNOSIS — Z79899 Other long term (current) drug therapy: Secondary | ICD-10-CM | POA: Insufficient documentation

## 2013-03-28 DIAGNOSIS — Z5189 Encounter for other specified aftercare: Secondary | ICD-10-CM | POA: Insufficient documentation

## 2013-03-28 DIAGNOSIS — Z9641 Presence of insulin pump (external) (internal): Secondary | ICD-10-CM | POA: Insufficient documentation

## 2013-03-28 DIAGNOSIS — E785 Hyperlipidemia, unspecified: Secondary | ICD-10-CM | POA: Insufficient documentation

## 2013-03-28 DIAGNOSIS — Z951 Presence of aortocoronary bypass graft: Secondary | ICD-10-CM | POA: Insufficient documentation

## 2013-03-28 DIAGNOSIS — G4733 Obstructive sleep apnea (adult) (pediatric): Secondary | ICD-10-CM | POA: Insufficient documentation

## 2013-03-28 DIAGNOSIS — I10 Essential (primary) hypertension: Secondary | ICD-10-CM | POA: Insufficient documentation

## 2013-03-28 DIAGNOSIS — Z794 Long term (current) use of insulin: Secondary | ICD-10-CM | POA: Insufficient documentation

## 2013-03-28 DIAGNOSIS — I252 Old myocardial infarction: Secondary | ICD-10-CM | POA: Insufficient documentation

## 2013-03-30 ENCOUNTER — Encounter (HOSPITAL_COMMUNITY)
Admission: RE | Admit: 2013-03-30 | Discharge: 2013-03-30 | Disposition: A | Discharge status: Home / Self Care | Payer: Self-pay | Source: Ambulatory Visit | Attending: Cardiovascular Disease | Admitting: Cardiovascular Disease

## 2013-04-02 ENCOUNTER — Encounter (HOSPITAL_COMMUNITY)
Admission: RE | Admit: 2013-04-02 | Discharge: 2013-04-02 | Disposition: A | Discharge status: Home / Self Care | Payer: Self-pay | Source: Ambulatory Visit | Attending: Cardiovascular Disease | Admitting: Cardiovascular Disease

## 2013-04-04 ENCOUNTER — Encounter (HOSPITAL_COMMUNITY)
Admission: RE | Admit: 2013-04-04 | Discharge: 2013-04-04 | Disposition: A | Discharge status: Home / Self Care | Payer: Self-pay | Source: Ambulatory Visit | Attending: Cardiovascular Disease | Admitting: Cardiovascular Disease

## 2013-04-06 ENCOUNTER — Encounter (HOSPITAL_COMMUNITY)
Admission: RE | Admit: 2013-04-06 | Discharge: 2013-04-06 | Disposition: A | Discharge status: Home / Self Care | Payer: Medicare Other | Source: Ambulatory Visit | Attending: Cardiovascular Disease | Admitting: Cardiovascular Disease

## 2013-04-09 ENCOUNTER — Encounter (HOSPITAL_COMMUNITY)
Admission: RE | Admit: 2013-04-09 | Discharge: 2013-04-09 | Disposition: A | Discharge status: Home / Self Care | Payer: Self-pay | Source: Ambulatory Visit | Attending: Cardiovascular Disease | Admitting: Cardiovascular Disease

## 2013-04-11 ENCOUNTER — Encounter (HOSPITAL_COMMUNITY)
Admission: RE | Admit: 2013-04-11 | Discharge: 2013-04-11 | Disposition: A | Discharge status: Home / Self Care | Payer: Self-pay | Source: Ambulatory Visit | Attending: Cardiovascular Disease | Admitting: Cardiovascular Disease

## 2013-04-13 ENCOUNTER — Encounter (HOSPITAL_COMMUNITY)
Admission: RE | Admit: 2013-04-13 | Discharge: 2013-04-13 | Disposition: A | Discharge status: Home / Self Care | Payer: Self-pay | Source: Ambulatory Visit | Attending: Cardiovascular Disease | Admitting: Cardiovascular Disease

## 2013-04-16 ENCOUNTER — Encounter (HOSPITAL_COMMUNITY)
Admission: RE | Admit: 2013-04-16 | Discharge: 2013-04-16 | Disposition: A | Discharge status: Home / Self Care | Payer: Self-pay | Source: Ambulatory Visit | Attending: Cardiovascular Disease | Admitting: Cardiovascular Disease

## 2013-04-18 ENCOUNTER — Encounter (HOSPITAL_COMMUNITY)
Admission: RE | Admit: 2013-04-18 | Discharge: 2013-04-18 | Disposition: A | Discharge status: Home / Self Care | Payer: Self-pay | Source: Ambulatory Visit | Attending: Cardiovascular Disease | Admitting: Cardiovascular Disease

## 2013-04-20 ENCOUNTER — Encounter (HOSPITAL_COMMUNITY)
Admission: RE | Admit: 2013-04-20 | Discharge: 2013-04-20 | Disposition: A | Discharge status: Home / Self Care | Payer: Self-pay | Source: Ambulatory Visit | Attending: Cardiovascular Disease | Admitting: Cardiovascular Disease

## 2013-04-20 ENCOUNTER — Other Ambulatory Visit: Payer: Self-pay | Admitting: *Deleted

## 2013-04-23 ENCOUNTER — Encounter (HOSPITAL_COMMUNITY)
Admission: RE | Admit: 2013-04-23 | Discharge: 2013-04-23 | Disposition: A | Discharge status: Home / Self Care | Payer: Self-pay | Source: Ambulatory Visit | Attending: Cardiovascular Disease | Admitting: Cardiovascular Disease

## 2013-04-24 ENCOUNTER — Encounter (INDEPENDENT_AMBULATORY_CARE_PROVIDER_SITE_OTHER): Payer: Self-pay

## 2013-04-24 ENCOUNTER — Encounter: Payer: Self-pay | Admitting: Nurse Practitioner

## 2013-04-24 ENCOUNTER — Ambulatory Visit (INDEPENDENT_AMBULATORY_CARE_PROVIDER_SITE_OTHER): Payer: Medicare Other | Admitting: Nurse Practitioner

## 2013-04-24 VITALS — BP 114/67 | HR 74 | Ht 72.5 in | Wt 239.0 lb

## 2013-04-24 DIAGNOSIS — Q8501 Neurofibromatosis, type 1: Secondary | ICD-10-CM

## 2013-04-24 DIAGNOSIS — G40209 Localization-related (focal) (partial) symptomatic epilepsy and epileptic syndromes with complex partial seizures, not intractable, without status epilepticus: Secondary | ICD-10-CM

## 2013-04-24 DIAGNOSIS — Z5181 Encounter for therapeutic drug level monitoring: Secondary | ICD-10-CM

## 2013-04-24 DIAGNOSIS — M79609 Pain in unspecified limb: Secondary | ICD-10-CM

## 2013-04-24 MED ORDER — LEVETIRACETAM 500 MG PO TABS: ORAL_TABLET | ORAL | Status: DC

## 2013-04-24 MED ORDER — CARBAMAZEPINE ER 200 MG PO TB12: 600.0000 mg | ORAL_TABLET | Freq: Two times a day (BID) | ORAL | Status: DC

## 2013-04-24 NOTE — Progress Notes (Signed)
[facility]  PATIENT: Jeff [name] DOB: 08-May-1966   REASON FOR VISIT: Follow up for seizure disorder   HISTORY OF PRESENT ILLNESS: Mr [name] is a 47 year old left-handed white male with a history of neurofibromatosis type I, and a history of seizures. The patient began noting intermittent tinnitus involving the left ear that began in October 2013. The patient has had some increase in frequency of these events. The patient was seen by Dr. Gean Quint, and audiometric testing was done. The patient was told there were some changes on the study. The patient has undergone MRI evaluation of the brain with a IAC study as well. 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 The patient denies any new numbness or weakness of the face, arms, or legs. The patient has had no headaches, but he occasionally will have some mild vertigo type sensations. The patient has not had any further seizures. The patient denies any gait instability. Patient underwent a brainstem auditory evoked response test at his last visit that was normal. There was no evidence of conduction slowing within the peripheral or central nervous system The patient returns for evaluation.  He will be seen again by Dr. Gean Quint in December.    REVIEW OF SYSTEMS: Full 14 system review of systems performed and notable only for:  Constitutional: Fatigue Cardiovascular: Chest pain Ear/Nose/Throat: Hearing loss Skin: N/A  Eyes: N/A  Respiratory: Shortness of breath Gastroitestinal: N/A  Hematology/Lymphatic: N/A  Endocrine: N/A Musculoskeletal: Joint pain  Allergy/Immunology: N/A  Neurological: N/A Psychiatric: Depression, anxiety Sleep  insomnia, obstructive sleep apnea, uses oxygen at night   ALLERGIES: Allergies  Allergen Reactions  . Latex Rash    HOME MEDICATIONS: Outpatient Prescriptions  Prior to Visit  Medication Sig Dispense Refill  . aspirin 81 MG tablet Take 81 mg by mouth daily.       . carbamazepine (TEGRETOL XR) 200 MG 12 hr tablet Take 600 mg by mouth 2 (two) times daily.      [name] docusate sodium (COLACE) 100 MG capsule Take 100 mg by mouth daily.       [name] ezetimibe (ZETIA) 10 MG tablet Take 10 mg by mouth at bedtime.       . Insulin Human (INSULIN PUMP) 100 unit/ml SOLN Inject into the skin continuous. Novolog Insulin Basal Rate as follows: 12 am .9 units, 6 am 1.9 units, 10 am 2.0 units, 12 pm 2.2 units, 6 pm 2.2 units      . isosorbide mononitrate (IMDUR) 60 MG 24 hr tablet Take 60 mg by mouth every morning.       . Lancets (ONETOUCH ULTRASOFT) lancets       . levETIRAcetam (KEPPRA) 500 MG tablet Take 250-500 mg by mouth 2 (two) times daily. Take 250 mg every morning and 500 mg every evening.      [name] lisinopril (PRINIVIL,ZESTRIL) 5 MG tablet Take 5 mg by mouth daily.      . metoprolol tartrate (LOPRESSOR) 25 MG tablet Take 25 mg by mouth 2 (two) times daily.      . multivitamin (ONE-A-DAY MEN'S) TABS Take 1 tablet by mouth daily.       . nitroGLYCERIN (NITROSTAT) 0.4 MG SL tablet Place 0.4 mg under the tongue every 5 (five) minutes as needed for chest pain.      [name] NOVOLOG 100 UNIT/ML injection       .  ONE TOUCH ULTRA TEST test strip       . rosuvastatin (CRESTOR) 40 MG tablet Take 40 mg by mouth at bedtime.       . sertraline (ZOLOFT) 100 MG tablet Take 100 mg by mouth at bedtime. One and half tablet at bedtime      . vitamin B-12 (CYANOCOBALAMIN) 500 MCG tablet Take 500 mcg by mouth daily.      . metoprolol tartrate (LOPRESSOR) 25 MG tablet take 1 tablet by mouth twice a day  60 tablet  6   No facility-administered medications prior to visit.    PAST MEDICAL HISTORY: Past Medical History  Diagnosis Date  . CAD (coronary artery disease)     Cath 2011.  LAD stent patent, distal LAD occlusion, D1 100%, OM branch 80 - 90%, SVG to diag patent, LIMA to LAD occluded,  SVG to RCA occluded, SVG to OM was occluded.   No change from previous cath.  Managed medically.   . Diabetes mellitus type I     on insulin pump at  home  . Sleep apnea     s/p oral surgery  . Hyperlipidemia   . 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 Diabetic retinopathy     s/p vitrectomy and history of retinal surgery  . HTN (hypertension)   . Seizure disorder   . Seasonal allergic rhinitis   . Depression   . Cataract   . Hemorrhoid   . Myocardial infarction   . Anxiety     PAST SURGICAL HISTORY: Past Surgical History  Procedure Laterality Date  . Coronary artery bypass graft      4 vessel 4/11  . Uvulopalatoplasty surgery    . Tonsillectomy    . Release of right transverse carpal ligament    . Right eye vitrectomy and detached retina repair  10/2007  . Laparoscopic appendectomy  11/24/2011  . Appendectomy      FAMILY HISTORY: Family History  Problem Relation Age of Onset  . Coronary artery disease Father 74  . Diabetes Father     type II  . Hypertension Father   . Squamous cell carcinoma Brother   . Arthritis Sister     SOCIAL HISTORY: History   Social History  . Marital Status: Married    Spouse Name: Almyra Free    Number of Children: 0  . Years of Education: college   Occupational History  . Unemployed     previously worked in [employer]   Social History Main Topics  . Smoking status: Never Smoker   . Smokeless tobacco: Never Used  . Alcohol Use: No  . Drug Use: No  . Sexual Activity: Yes   Other Topics Concern  . Not on file   Social History Narrative   Patient lives with his wifeLanora Manis).   Patient does not have any children.   Patient is on disability.   Patient is left-handed (writes), but right hand with sports.   Patient has a college education.   Patient drinks 4 cans of Diet Coke daily.     PHYSICAL EXAM  Filed Vitals:   04/24/13 1032  BP: 114/67  Pulse: 74  Height: 6' 0.5" (1.842 m)  Weight:  239 lb (108.41 kg)   Body mass index is 31.95 kg/(m^2).  Generalized: Well developed, moderately obese male in no acute distress     Neurological examination   Mentation: Alert oriented to time, place, history taking. Follows  all commands speech and language fluent  Cranial nerve II-XII: Pupils were equal round reactive to light extraocular movements were full, visual field were full on confrontational test. Facial sensation and strength were normal. hearing was intact to finger rubbing bilaterally. Uvula tongue midline. head turning and shoulder shrug and were normal and symmetric.Tongue protrusion into cheek strength was normal. Motor: normal bulk and tone, full strength in the BUE, BLE, fine finger movements normal, no pronator drift. No focal weakness Coordination: finger-nose-finger, heel-to-shin bilaterally, no dysmetria Reflexes: Depressed and symmetric upper and lower  Gait and Station: Rising up from seated position without assistance, normal stance,  moderate stride, good arm swing, smooth turning, able to perform tiptoe, and heel walking without difficulty.  Tandem gait steady  DIAGNOSTIC DATA (LABS, IMAGING, TESTING) - I reviewed patient records, labs, notes, testing and imaging myself where available.  Lab Results  Component Value Date   WBC 5.6 02/16/2013   HGB 14.6 02/16/2013   HCT 42.4 02/16/2013   MCV 90.1 02/16/2013   PLT 206.0 02/16/2013      Component Value Date/Time   NA 136 02/16/2013 1232   K 4.5 02/16/2013 1232   CL 105 02/16/2013 1232   CO2 25 02/16/2013 1232   GLUCOSE 219* 02/16/2013 1232   BUN 15 02/16/2013 1232   CREATININE 0.8 02/16/2013 1232   CALCIUM 8.8 02/16/2013 1232   PROT 6.9 02/26/2012 0034   ALBUMIN 4.0 02/26/2012 0034   AST 23 02/26/2012 0034   ALT 32 02/26/2012 0034   ALKPHOS 87 02/26/2012 0034   BILITOT 0.2* 02/26/2012 0034   GFRNONAA >90 02/26/2012 0034   GFRAA >90 02/26/2012 0034   ASSESSMENT AND PLAN  47 y.o. year old male  has a past medical  history of CAD (coronary artery disease); Diabetes mellitus type I; Sleep apnea; Hyperlipidemia; Neurofibromatosis; Diabetic retinopathy; HTN (hypertension); Seizure disorder;  here to followup. He has not had further seizure activity.  Pt will continue Keppra as directed, 250mg  am 500mg  hs Patient will continue Tegretol at current dose 600mg  twice daily. Will renew meds F/U 6 months, will repeat labs at that time Nilda Riggs, [facility], [facility], APRN  [facility] [address], [address] [place], [place] [address] [phone]

## 2013-04-24 NOTE — Progress Notes (Signed)
I have read the note, and I agree with the clinical assessment and plan.  WILLIS,CHARLES KEITH   

## 2013-04-24 NOTE — Patient Instructions (Signed)
Pt will continue Keppra as directed Patient will continue Tegretol at current dose. Will renew meds F/U 6 months

## 2013-04-25 ENCOUNTER — Encounter (HOSPITAL_COMMUNITY)
Admission: RE | Admit: 2013-04-25 | Discharge: 2013-04-25 | Disposition: A | Discharge status: Home / Self Care | Payer: Self-pay | Source: Ambulatory Visit | Attending: Cardiovascular Disease | Admitting: Cardiovascular Disease

## 2013-04-25 ENCOUNTER — Telehealth: Payer: Self-pay | Admitting: Neurology

## 2013-04-25 NOTE — Telephone Encounter (Signed)
Name brand only.

## 2013-04-27 ENCOUNTER — Encounter (HOSPITAL_COMMUNITY)
Admission: RE | Admit: 2013-04-27 | Discharge: 2013-04-27 | Disposition: A | Discharge status: Home / Self Care | Payer: Self-pay | Source: Ambulatory Visit | Attending: Cardiovascular Disease | Admitting: Cardiovascular Disease

## 2013-04-30 ENCOUNTER — Encounter (HOSPITAL_COMMUNITY)
Admission: RE | Admit: 2013-04-30 | Discharge: 2013-04-30 | Disposition: A | Discharge status: Home / Self Care | Payer: Self-pay | Source: Ambulatory Visit | Attending: Cardiovascular Disease | Admitting: Cardiovascular Disease

## 2013-04-30 DIAGNOSIS — G4733 Obstructive sleep apnea (adult) (pediatric): Secondary | ICD-10-CM | POA: Insufficient documentation

## 2013-04-30 DIAGNOSIS — I252 Old myocardial infarction: Secondary | ICD-10-CM | POA: Insufficient documentation

## 2013-04-30 DIAGNOSIS — I498 Other specified cardiac arrhythmias: Secondary | ICD-10-CM | POA: Insufficient documentation

## 2013-04-30 DIAGNOSIS — I10 Essential (primary) hypertension: Secondary | ICD-10-CM | POA: Insufficient documentation

## 2013-04-30 DIAGNOSIS — F341 Dysthymic disorder: Secondary | ICD-10-CM | POA: Insufficient documentation

## 2013-04-30 DIAGNOSIS — Z951 Presence of aortocoronary bypass graft: Secondary | ICD-10-CM | POA: Insufficient documentation

## 2013-04-30 DIAGNOSIS — Z9641 Presence of insulin pump (external) (internal): Secondary | ICD-10-CM | POA: Insufficient documentation

## 2013-04-30 DIAGNOSIS — E785 Hyperlipidemia, unspecified: Secondary | ICD-10-CM | POA: Insufficient documentation

## 2013-04-30 DIAGNOSIS — I319 Disease of pericardium, unspecified: Secondary | ICD-10-CM | POA: Insufficient documentation

## 2013-04-30 DIAGNOSIS — Q8500 Neurofibromatosis, unspecified: Secondary | ICD-10-CM | POA: Insufficient documentation

## 2013-04-30 DIAGNOSIS — Z79899 Other long term (current) drug therapy: Secondary | ICD-10-CM | POA: Insufficient documentation

## 2013-04-30 DIAGNOSIS — Z5189 Encounter for other specified aftercare: Secondary | ICD-10-CM | POA: Insufficient documentation

## 2013-04-30 DIAGNOSIS — E109 Type 1 diabetes mellitus without complications: Secondary | ICD-10-CM | POA: Insufficient documentation

## 2013-04-30 DIAGNOSIS — Z794 Long term (current) use of insulin: Secondary | ICD-10-CM | POA: Insufficient documentation

## 2013-04-30 DIAGNOSIS — I2510 Atherosclerotic heart disease of native coronary artery without angina pectoris: Secondary | ICD-10-CM | POA: Insufficient documentation

## 2013-05-02 ENCOUNTER — Encounter (HOSPITAL_COMMUNITY)
Admission: RE | Admit: 2013-05-02 | Discharge: 2013-05-02 | Disposition: A | Discharge status: Home / Self Care | Payer: Self-pay | Source: Ambulatory Visit | Attending: Cardiovascular Disease | Admitting: Cardiovascular Disease

## 2013-05-02 ENCOUNTER — Ambulatory Visit (INDEPENDENT_AMBULATORY_CARE_PROVIDER_SITE_OTHER): Payer: Medicare Other | Admitting: Cardiovascular Disease

## 2013-05-02 ENCOUNTER — Encounter: Payer: Self-pay | Admitting: Cardiovascular Disease

## 2013-05-02 VITALS — BP 120/76 | HR 71 | Ht 71.0 in | Wt 236.0 lb

## 2013-05-02 DIAGNOSIS — I25810 Atherosclerosis of coronary artery bypass graft(s) without angina pectoris: Secondary | ICD-10-CM

## 2013-05-02 NOTE — Patient Instructions (Signed)
Your physician wants you to follow-up in: 6 MONTHS with Dr Cooper.  You will receive a reminder letter in the mail two months in advance. If you don't receive a letter, please call our office to schedule the follow-up appointment.  Your physician recommends that you continue on your current medications as directed. Please refer to the Current Medication list given to you today.  

## 2013-05-02 NOTE — Progress Notes (Signed)
HPI:  47 year old gentleman presenting for followup evaluation. He's followed for coronary artery disease with history of CABG and early bypass graft failure. He was treated with multivessel PCI following CABG. he presented in August with worsening chest discomfort and underwent cardiac catheterization. The detailed report as below, but in summary he had continued patency of his stent sites and continued patency of his diagonal vein graft. There was a pattern of diffuse small vessel disease noted. Medical therapy was continued.  He woke up yesterday from a nap with left-sided chest pain. The pain has been constant ever since then. There is no exertional component. He thinks he may have slept wrong. He has no associated shortness of breath, diaphoresis, edema, or palpitations. He continues with cardiac rehabilitation without exertional complaints. He has reduced the speed and resistance of the machines in order to prevent anginal symptoms.   Outpatient Encounter Prescriptions as of 05/02/2013  Medication Sig  . ALPRAZolam (XANAX) 0.5 MG tablet Take 0.5 mg by mouth at bedtime as needed for anxiety.  [name] aspirin 81 MG tablet Take 81 mg by mouth daily.   . carbamazepine (TEGRETOL XR) 200 MG 12 hr tablet Take 3 tablets (600 mg total) by mouth 2 (two) times daily.  [name] docusate sodium (COLACE) 100 MG capsule Take 100 mg by mouth daily.   [name] ezetimibe (ZETIA) 10 MG tablet Take 10 mg by mouth at bedtime.   . Insulin Human (INSULIN PUMP) 100 unit/ml SOLN Inject into the skin continuous.   . isosorbide mononitrate (IMDUR) 60 MG 24 hr tablet Take 60 mg by mouth every morning.   . Lancets (ONETOUCH ULTRASOFT) lancets   . levETIRAcetam (KEPPRA) 500 MG tablet Take 250 mg every morning and 500 mg every evening.  [name] lisinopril (PRINIVIL,ZESTRIL) 5 MG tablet Take 5 mg by mouth daily.  . metoprolol tartrate (LOPRESSOR) 25 MG tablet Take 25 mg by mouth 2 (two) times daily.  . multivitamin (ONE-A-DAY MEN'S) TABS Take 1  tablet by mouth daily.   . nitroGLYCERIN (NITROSTAT) 0.4 MG SL tablet Place 0.4 mg under the tongue every 5 (five) minutes as needed for chest pain.  [name] NOVOLOG 100 UNIT/ML injection Novolog Insulin Basal Rate as follows: 12 am .9 units, 6 am 1.9 units, 10 am 2.0 units, 12 pm 2.2 units, 6 pm 2.2 units  . ONE TOUCH ULTRA TEST test strip   . rosuvastatin (CRESTOR) 40 MG tablet Take 40 mg by mouth at bedtime.   . sertraline (ZOLOFT) 100 MG tablet Take 100 mg by mouth at bedtime. One and half tablet at bedtime  . vitamin B-12 (CYANOCOBALAMIN) 500 MCG tablet Take 500 mcg by mouth daily.    Allergies  Allergen Reactions  . Latex Rash    Past Medical History  Diagnosis Date  . CAD (coronary artery disease)     Cath 2011.  LAD stent patent, distal LAD occlusion, D1 100%, OM branch 80 - 90%, SVG to diag patent, LIMA to LAD occluded, SVG to RCA occluded, SVG to OM was occluded.   No change from previous cath.  Managed medically.   . Diabetes mellitus type I     on insulin pump at  home  . Sleep apnea     s/p oral surgery  . Hyperlipidemia   . 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 Diabetic retinopathy     s/p vitrectomy and history of retinal surgery  . HTN (hypertension)   .  Seizure disorder   . Seasonal allergic rhinitis   . Depression   . Cataract   . Hemorrhoid   . Myocardial infarction   . Anxiety     ROS: Negative except as per HPI  BP 120/76  Pulse 71  Ht 5\' 11"  (1.803 m)  Wt 236 lb (107.049 kg)  BMI 32.93 kg/m2  SpO2 95%  PHYSICAL EXAM: Pt is alert and oriented, NAD HEENT: normal Neck: JVP - normal, carotids 2+= without bruits Lungs: CTA bilaterally CV: RRR without murmur or gallop Abd: soft, NT, Positive BS, no hepatomegaly Ext: no C/C/E, distal pulses intact and equal Skin: warm/dry no rash  EKG:  Normal sinus rhythm 71 beats per minute, inferior ST segment elevation unchanged from old tracings, rightward axis  Cardiac  catheterization 02/22/2013: Procedural Findings:  Hemodynamics:  AO 115/64, mean 86  LV 115/12  Coronary angiography:  Coronary dominance: right  Left mainstem: Widely patent without obstructive disease. Arises from the left cusp it divides into the LAD and left circumflex.  Left anterior descending (LAD): The LAD is patent to the distal anterior wall. The proximal LAD has diffuse 30% stenosis. The stented segment in the proximal to mid LAD is widely patent without significant restenosis. The diagonal branch is occluded at its origin and it fills from saphenous vein graft flow. The stented segment in the distal LAD is widely patent without significant restenosis. The apical LAD is chronically occluded and unchanged from the previous study.  Left circumflex (LCx): The left circumflex is patent. The proximal AV circumflex is patent without significant stenosis. The first obtuse marginal branch has diffuse disease in a small vessel pattern, primarily in the distal subbranch with 95% stenosis present. This vessel is about 1-1.5 mm in diameter. There is a left posterolateral branch that is patent throughout its course.  Right coronary artery (RCA): The RCA is diffusely diseased. There is diffuse 60-70% stenosis throughout the proximal and mid portions of the vessel. The distal vessel is occluded and fills from left to right collaterals. The entire RCA is small in caliber.  Saphenous vein graft to diagonal is widely patent. There is no significant stenosis present. The graft is improved in appearance compared to the 2011 study.  Left ventriculography: Left ventricular systolic function is normal, LVEF is estimated at 55-65%, there is no significant mitral regurgitation  Final Conclusions:  1. Severe 3 vessel coronary artery disease primarily with a distal small vessel disease pattern as detailed above with total occlusion of the apical LAD, small vessel disease of the left circumflex subbranches, and total  occlusion of the distal RCA.  2. Continued patency of the stented segments in the proximal and distal LAD  3. Continued patency of the saphenous vein graft to diagonal branch  4. Preserved left ventricular systolic function.   ASSESSMENT AND PLAN: 1. Coronary artery disease, native vessel. The patient is stable without any change in his anginal symptoms. Constant pain that he has been experiencing has clear components of musculoskeletal chest pain. His left chest wall is tender to palpation. No changes in his medications were made today.  2. Hypertension. Blood pressure is well controlled on lisinopril, metoprolol, and isosorbide.  3. Hyperlipidemia. He is treated aggressively with high-dose Crestor and ezetemibe.   He will follow-up in 6 months unless problems arise. Anxiety is playing a major component in his symptoms and Dr Waynard Edwards has written for low-dose prn Xanax which has been helpful. I tried to encourage him about the stable findings on  recent cardiac cath.  Tonny Bollman 05/02/2013 10:52 AM

## 2013-05-04 ENCOUNTER — Encounter (HOSPITAL_COMMUNITY)
Admission: RE | Admit: 2013-05-04 | Discharge: 2013-05-04 | Disposition: A | Discharge status: Home / Self Care | Payer: Self-pay | Source: Ambulatory Visit | Attending: Cardiovascular Disease | Admitting: Cardiovascular Disease

## 2013-05-07 ENCOUNTER — Encounter (HOSPITAL_COMMUNITY)
Admission: RE | Admit: 2013-05-07 | Discharge: 2013-05-07 | Disposition: A | Discharge status: Home / Self Care | Payer: Self-pay | Source: Ambulatory Visit | Attending: Cardiovascular Disease | Admitting: Cardiovascular Disease

## 2013-05-08 ENCOUNTER — Telehealth: Payer: Self-pay

## 2013-05-08 MED ORDER — CARBAMAZEPINE ER 200 MG PO TB12: 600.0000 mg | ORAL_TABLET | Freq: Two times a day (BID) | ORAL | Status: DC

## 2013-05-08 NOTE — Telephone Encounter (Signed)
Unless there is an issue of cost, I would stick with the brand name carbamazepine.

## 2013-05-08 NOTE — Telephone Encounter (Signed)
Changed to DAW for Tegretol.

## 2013-05-09 ENCOUNTER — Encounter (HOSPITAL_COMMUNITY)
Admission: RE | Admit: 2013-05-09 | Discharge: 2013-05-09 | Disposition: A | Discharge status: Home / Self Care | Payer: Self-pay | Source: Ambulatory Visit | Attending: Cardiovascular Disease | Admitting: Cardiovascular Disease

## 2013-05-11 ENCOUNTER — Encounter (HOSPITAL_COMMUNITY)
Admission: RE | Admit: 2013-05-11 | Discharge: 2013-05-11 | Disposition: A | Discharge status: Home / Self Care | Payer: Self-pay | Source: Ambulatory Visit | Attending: Cardiovascular Disease | Admitting: Cardiovascular Disease

## 2013-05-14 ENCOUNTER — Encounter (HOSPITAL_COMMUNITY)
Admission: RE | Admit: 2013-05-14 | Discharge: 2013-05-14 | Disposition: A | Discharge status: Home / Self Care | Payer: Self-pay | Source: Ambulatory Visit | Attending: Cardiovascular Disease | Admitting: Cardiovascular Disease

## 2013-05-16 ENCOUNTER — Encounter (HOSPITAL_COMMUNITY)
Admission: RE | Admit: 2013-05-16 | Discharge: 2013-05-16 | Disposition: A | Discharge status: Home / Self Care | Payer: Self-pay | Source: Ambulatory Visit | Attending: Cardiovascular Disease | Admitting: Cardiovascular Disease

## 2013-05-18 ENCOUNTER — Encounter (HOSPITAL_COMMUNITY)
Admission: RE | Admit: 2013-05-18 | Discharge: 2013-05-18 | Disposition: A | Discharge status: Home / Self Care | Payer: Self-pay | Source: Ambulatory Visit | Attending: Cardiovascular Disease | Admitting: Cardiovascular Disease

## 2013-05-21 ENCOUNTER — Encounter (HOSPITAL_COMMUNITY)
Admission: RE | Admit: 2013-05-21 | Discharge: 2013-05-21 | Disposition: A | Discharge status: Home / Self Care | Payer: Self-pay | Source: Ambulatory Visit | Attending: Cardiovascular Disease | Admitting: Cardiovascular Disease

## 2013-05-23 ENCOUNTER — Encounter (HOSPITAL_COMMUNITY)
Admission: RE | Admit: 2013-05-23 | Discharge: 2013-05-23 | Disposition: A | Discharge status: Home / Self Care | Payer: Self-pay | Source: Ambulatory Visit | Attending: Cardiovascular Disease | Admitting: Cardiovascular Disease

## 2013-05-28 ENCOUNTER — Encounter (HOSPITAL_COMMUNITY)
Admission: RE | Admit: 2013-05-28 | Discharge: 2013-05-28 | Disposition: A | Discharge status: Home / Self Care | Payer: Self-pay | Source: Ambulatory Visit | Attending: Cardiovascular Disease | Admitting: Cardiovascular Disease

## 2013-05-28 DIAGNOSIS — E785 Hyperlipidemia, unspecified: Secondary | ICD-10-CM | POA: Insufficient documentation

## 2013-05-28 DIAGNOSIS — Z79899 Other long term (current) drug therapy: Secondary | ICD-10-CM | POA: Insufficient documentation

## 2013-05-28 DIAGNOSIS — I252 Old myocardial infarction: Secondary | ICD-10-CM | POA: Insufficient documentation

## 2013-05-28 DIAGNOSIS — F341 Dysthymic disorder: Secondary | ICD-10-CM | POA: Insufficient documentation

## 2013-05-28 DIAGNOSIS — I10 Essential (primary) hypertension: Secondary | ICD-10-CM | POA: Insufficient documentation

## 2013-05-28 DIAGNOSIS — G4733 Obstructive sleep apnea (adult) (pediatric): Secondary | ICD-10-CM | POA: Insufficient documentation

## 2013-05-28 DIAGNOSIS — Z5189 Encounter for other specified aftercare: Secondary | ICD-10-CM | POA: Insufficient documentation

## 2013-05-28 DIAGNOSIS — Z9641 Presence of insulin pump (external) (internal): Secondary | ICD-10-CM | POA: Insufficient documentation

## 2013-05-28 DIAGNOSIS — I2510 Atherosclerotic heart disease of native coronary artery without angina pectoris: Secondary | ICD-10-CM | POA: Insufficient documentation

## 2013-05-28 DIAGNOSIS — Q8500 Neurofibromatosis, unspecified: Secondary | ICD-10-CM | POA: Insufficient documentation

## 2013-05-28 DIAGNOSIS — I498 Other specified cardiac arrhythmias: Secondary | ICD-10-CM | POA: Insufficient documentation

## 2013-05-28 DIAGNOSIS — E109 Type 1 diabetes mellitus without complications: Secondary | ICD-10-CM | POA: Insufficient documentation

## 2013-05-28 DIAGNOSIS — I319 Disease of pericardium, unspecified: Secondary | ICD-10-CM | POA: Insufficient documentation

## 2013-05-28 DIAGNOSIS — Z951 Presence of aortocoronary bypass graft: Secondary | ICD-10-CM | POA: Insufficient documentation

## 2013-05-28 DIAGNOSIS — Z794 Long term (current) use of insulin: Secondary | ICD-10-CM | POA: Insufficient documentation

## 2013-05-30 ENCOUNTER — Encounter (HOSPITAL_COMMUNITY)
Admission: RE | Admit: 2013-05-30 | Discharge: 2013-05-30 | Disposition: A | Discharge status: Home / Self Care | Payer: Self-pay | Source: Ambulatory Visit | Attending: Cardiovascular Disease | Admitting: Cardiovascular Disease

## 2013-06-01 ENCOUNTER — Encounter (HOSPITAL_COMMUNITY)
Admission: RE | Admit: 2013-06-01 | Discharge: 2013-06-01 | Disposition: A | Discharge status: Home / Self Care | Payer: Self-pay | Source: Ambulatory Visit | Attending: Cardiovascular Disease | Admitting: Cardiovascular Disease

## 2013-06-04 ENCOUNTER — Encounter (HOSPITAL_COMMUNITY)
Admission: RE | Admit: 2013-06-04 | Discharge: 2013-06-04 | Disposition: A | Discharge status: Home / Self Care | Payer: Self-pay | Source: Ambulatory Visit | Attending: Cardiovascular Disease | Admitting: Cardiovascular Disease

## 2013-06-06 ENCOUNTER — Other Ambulatory Visit: Payer: Self-pay

## 2013-06-06 ENCOUNTER — Encounter (HOSPITAL_COMMUNITY)
Admission: RE | Admit: 2013-06-06 | Discharge: 2013-06-06 | Disposition: A | Discharge status: Home / Self Care | Payer: Self-pay | Source: Ambulatory Visit | Attending: Cardiovascular Disease | Admitting: Cardiovascular Disease

## 2013-06-06 MED ORDER — NITROGLYCERIN 0.4 MG SL SUBL: 0.4000 mg | SUBLINGUAL_TABLET | SUBLINGUAL | Status: DC | PRN

## 2013-06-08 ENCOUNTER — Encounter (HOSPITAL_COMMUNITY)
Admission: RE | Admit: 2013-06-08 | Discharge: 2013-06-08 | Disposition: A | Discharge status: Home / Self Care | Payer: Self-pay | Source: Ambulatory Visit | Attending: Cardiovascular Disease | Admitting: Cardiovascular Disease

## 2013-06-11 ENCOUNTER — Encounter (HOSPITAL_COMMUNITY)
Admission: RE | Admit: 2013-06-11 | Discharge: 2013-06-11 | Disposition: A | Discharge status: Home / Self Care | Payer: Self-pay | Source: Ambulatory Visit | Attending: Cardiovascular Disease | Admitting: Cardiovascular Disease

## 2013-06-13 ENCOUNTER — Encounter (HOSPITAL_COMMUNITY)
Admission: RE | Admit: 2013-06-13 | Discharge: 2013-06-13 | Disposition: A | Discharge status: Home / Self Care | Payer: Self-pay | Source: Ambulatory Visit | Attending: Cardiovascular Disease | Admitting: Cardiovascular Disease

## 2013-06-15 ENCOUNTER — Encounter (HOSPITAL_COMMUNITY)
Admission: RE | Admit: 2013-06-15 | Discharge: 2013-06-15 | Disposition: A | Discharge status: Home / Self Care | Payer: Self-pay | Source: Ambulatory Visit | Attending: Cardiovascular Disease | Admitting: Cardiovascular Disease

## 2013-06-18 ENCOUNTER — Encounter (HOSPITAL_COMMUNITY)
Admission: RE | Admit: 2013-06-18 | Discharge: 2013-06-18 | Disposition: A | Discharge status: Home / Self Care | Payer: Self-pay | Source: Ambulatory Visit | Attending: Cardiovascular Disease | Admitting: Cardiovascular Disease

## 2013-06-20 ENCOUNTER — Encounter (HOSPITAL_COMMUNITY): Payer: Medicare Other

## 2013-06-22 ENCOUNTER — Encounter (HOSPITAL_COMMUNITY): Payer: Medicare Other

## 2013-06-25 ENCOUNTER — Encounter (HOSPITAL_COMMUNITY)
Admission: RE | Admit: 2013-06-25 | Discharge: 2013-06-25 | Disposition: A | Discharge status: Home / Self Care | Payer: Self-pay | Source: Ambulatory Visit | Attending: Cardiovascular Disease | Admitting: Cardiovascular Disease

## 2013-06-27 ENCOUNTER — Encounter (HOSPITAL_COMMUNITY)
Admission: RE | Admit: 2013-06-27 | Discharge: 2013-06-27 | Disposition: A | Discharge status: Home / Self Care | Payer: Self-pay | Source: Ambulatory Visit | Attending: Cardiovascular Disease | Admitting: Cardiovascular Disease

## 2013-06-27 NOTE — Progress Notes (Signed)
Noted a "1" rating written by patient in his exercise flow-sheet today about 15 minutes into the start of class. Patient was on second station treadmill and this writer asked him about the pain rating he had written in. He said he believes it to be indigestion. He reported eating at [facility] last night with friends and believes the meal has caused some indigestion. He reported no other symptoms. VSS. Asked patient to report any changes during exercise session. Reminded patient to report symptoms to staff prior to exercise and report at very beginning of class at check in so that staff may discuss situation. Patient completed exercise with no issues. VSS. Discomfort resolved. He said he felt better with the exercise.

## 2013-06-29 ENCOUNTER — Encounter (HOSPITAL_COMMUNITY): Payer: Self-pay

## 2013-06-29 DIAGNOSIS — I2510 Atherosclerotic heart disease of native coronary artery without angina pectoris: Secondary | ICD-10-CM | POA: Insufficient documentation

## 2013-06-29 DIAGNOSIS — Z5189 Encounter for other specified aftercare: Secondary | ICD-10-CM | POA: Insufficient documentation

## 2013-06-29 DIAGNOSIS — I252 Old myocardial infarction: Secondary | ICD-10-CM | POA: Insufficient documentation

## 2013-06-29 DIAGNOSIS — Z951 Presence of aortocoronary bypass graft: Secondary | ICD-10-CM | POA: Insufficient documentation

## 2013-07-02 ENCOUNTER — Encounter (HOSPITAL_COMMUNITY)
Admission: RE | Admit: 2013-07-02 | Discharge: 2013-07-02 | Disposition: A | Discharge status: Home / Self Care | Payer: Self-pay | Source: Ambulatory Visit | Attending: Cardiovascular Disease | Admitting: Cardiovascular Disease

## 2013-07-04 ENCOUNTER — Encounter (HOSPITAL_COMMUNITY)
Admission: RE | Admit: 2013-07-04 | Discharge: 2013-07-04 | Disposition: A | Discharge status: Home / Self Care | Payer: Self-pay | Source: Ambulatory Visit | Attending: Cardiovascular Disease | Admitting: Cardiovascular Disease

## 2013-07-06 ENCOUNTER — Encounter (HOSPITAL_COMMUNITY)
Admission: RE | Admit: 2013-07-06 | Discharge: 2013-07-06 | Disposition: A | Discharge status: Home / Self Care | Payer: Self-pay | Source: Ambulatory Visit | Attending: Cardiovascular Disease | Admitting: Cardiovascular Disease

## 2013-07-09 ENCOUNTER — Encounter (HOSPITAL_COMMUNITY)
Admission: RE | Admit: 2013-07-09 | Discharge: 2013-07-09 | Disposition: A | Discharge status: Home / Self Care | Payer: Self-pay | Source: Ambulatory Visit | Attending: Cardiovascular Disease | Admitting: Cardiovascular Disease

## 2013-07-11 ENCOUNTER — Encounter (HOSPITAL_COMMUNITY): Payer: Self-pay

## 2013-07-12 ENCOUNTER — Encounter (HOSPITAL_COMMUNITY)
Admission: RE | Admit: 2013-07-12 | Discharge: 2013-07-12 | Disposition: A | Discharge status: Home / Self Care | Payer: Self-pay | Source: Ambulatory Visit | Attending: Cardiovascular Disease | Admitting: Cardiovascular Disease

## 2013-07-13 ENCOUNTER — Encounter (HOSPITAL_COMMUNITY)
Admission: RE | Admit: 2013-07-13 | Discharge: 2013-07-13 | Disposition: A | Discharge status: Home / Self Care | Payer: Self-pay | Source: Ambulatory Visit | Attending: Cardiovascular Disease | Admitting: Cardiovascular Disease

## 2013-07-16 ENCOUNTER — Encounter (HOSPITAL_COMMUNITY)
Admission: RE | Admit: 2013-07-16 | Discharge: 2013-07-16 | Disposition: A | Discharge status: Home / Self Care | Payer: Self-pay | Source: Ambulatory Visit | Attending: Cardiovascular Disease | Admitting: Cardiovascular Disease

## 2013-07-18 ENCOUNTER — Encounter (HOSPITAL_COMMUNITY)
Admission: RE | Admit: 2013-07-18 | Discharge: 2013-07-18 | Disposition: A | Discharge status: Home / Self Care | Payer: Self-pay | Source: Ambulatory Visit | Attending: Cardiovascular Disease | Admitting: Cardiovascular Disease

## 2013-07-20 ENCOUNTER — Encounter (HOSPITAL_COMMUNITY): Payer: Self-pay

## 2013-07-23 ENCOUNTER — Encounter (HOSPITAL_COMMUNITY)
Admission: RE | Admit: 2013-07-23 | Discharge: 2013-07-23 | Disposition: A | Discharge status: Home / Self Care | Payer: Self-pay | Source: Ambulatory Visit | Attending: Cardiovascular Disease | Admitting: Cardiovascular Disease

## 2013-07-25 ENCOUNTER — Encounter (HOSPITAL_COMMUNITY)
Admission: RE | Admit: 2013-07-25 | Discharge: 2013-07-25 | Disposition: A | Discharge status: Home / Self Care | Payer: Self-pay | Source: Ambulatory Visit | Attending: Cardiovascular Disease | Admitting: Cardiovascular Disease

## 2013-07-27 ENCOUNTER — Encounter (HOSPITAL_COMMUNITY)
Admission: RE | Admit: 2013-07-27 | Discharge: 2013-07-27 | Disposition: A | Discharge status: Home / Self Care | Payer: Self-pay | Source: Ambulatory Visit | Attending: Cardiovascular Disease | Admitting: Cardiovascular Disease

## 2013-07-30 ENCOUNTER — Encounter (HOSPITAL_COMMUNITY)
Admission: RE | Admit: 2013-07-30 | Discharge: 2013-07-30 | Disposition: A | Discharge status: Home / Self Care | Payer: Self-pay | Source: Ambulatory Visit | Attending: Cardiovascular Disease | Admitting: Cardiovascular Disease

## 2013-07-30 DIAGNOSIS — Z5189 Encounter for other specified aftercare: Secondary | ICD-10-CM | POA: Insufficient documentation

## 2013-07-30 DIAGNOSIS — Z951 Presence of aortocoronary bypass graft: Secondary | ICD-10-CM | POA: Insufficient documentation

## 2013-07-30 DIAGNOSIS — I252 Old myocardial infarction: Secondary | ICD-10-CM | POA: Insufficient documentation

## 2013-07-30 DIAGNOSIS — I2510 Atherosclerotic heart disease of native coronary artery without angina pectoris: Secondary | ICD-10-CM | POA: Insufficient documentation

## 2013-08-01 ENCOUNTER — Encounter (HOSPITAL_COMMUNITY)
Admission: RE | Admit: 2013-08-01 | Discharge: 2013-08-01 | Disposition: A | Discharge status: Home / Self Care | Payer: Self-pay | Source: Ambulatory Visit | Attending: Cardiovascular Disease | Admitting: Cardiovascular Disease

## 2013-08-03 ENCOUNTER — Encounter (HOSPITAL_COMMUNITY)
Admission: RE | Admit: 2013-08-03 | Discharge: 2013-08-03 | Disposition: A | Discharge status: Home / Self Care | Payer: Medicare Other | Source: Ambulatory Visit | Attending: Cardiovascular Disease | Admitting: Cardiovascular Disease

## 2013-08-06 ENCOUNTER — Encounter (HOSPITAL_COMMUNITY)
Admission: RE | Admit: 2013-08-06 | Discharge: 2013-08-06 | Disposition: A | Discharge status: Home / Self Care | Payer: Self-pay | Source: Ambulatory Visit | Attending: Cardiovascular Disease | Admitting: Cardiovascular Disease

## 2013-08-08 ENCOUNTER — Encounter (HOSPITAL_COMMUNITY)
Admission: RE | Admit: 2013-08-08 | Discharge: 2013-08-08 | Disposition: A | Discharge status: Home / Self Care | Payer: Self-pay | Source: Ambulatory Visit | Attending: Cardiovascular Disease | Admitting: Cardiovascular Disease

## 2013-08-10 ENCOUNTER — Encounter (HOSPITAL_COMMUNITY)
Admission: RE | Admit: 2013-08-10 | Discharge: 2013-08-10 | Disposition: A | Discharge status: Home / Self Care | Payer: Medicare Other | Source: Ambulatory Visit | Attending: Cardiovascular Disease | Admitting: Cardiovascular Disease

## 2013-08-13 ENCOUNTER — Encounter (HOSPITAL_COMMUNITY)
Admission: RE | Admit: 2013-08-13 | Discharge: 2013-08-13 | Disposition: A | Discharge status: Home / Self Care | Payer: Self-pay | Source: Ambulatory Visit | Attending: Cardiovascular Disease | Admitting: Cardiovascular Disease

## 2013-08-15 ENCOUNTER — Encounter (HOSPITAL_COMMUNITY): Payer: Self-pay

## 2013-08-17 ENCOUNTER — Encounter (HOSPITAL_COMMUNITY)
Admission: RE | Admit: 2013-08-17 | Discharge: 2013-08-17 | Disposition: A | Discharge status: Home / Self Care | Payer: Self-pay | Source: Ambulatory Visit | Attending: Cardiovascular Disease | Admitting: Cardiovascular Disease

## 2013-08-20 ENCOUNTER — Encounter (HOSPITAL_COMMUNITY)
Admission: RE | Admit: 2013-08-20 | Discharge: 2013-08-20 | Disposition: A | Discharge status: Home / Self Care | Payer: Self-pay | Source: Ambulatory Visit | Attending: Cardiovascular Disease | Admitting: Cardiovascular Disease

## 2013-08-22 ENCOUNTER — Encounter (HOSPITAL_COMMUNITY)
Admission: RE | Admit: 2013-08-22 | Discharge: 2013-08-22 | Disposition: A | Discharge status: Home / Self Care | Payer: Self-pay | Source: Ambulatory Visit | Attending: Cardiovascular Disease | Admitting: Cardiovascular Disease

## 2013-08-24 ENCOUNTER — Encounter (HOSPITAL_COMMUNITY)
Admission: RE | Admit: 2013-08-24 | Discharge: 2013-08-24 | Disposition: A | Discharge status: Home / Self Care | Payer: Self-pay | Source: Ambulatory Visit | Attending: Cardiovascular Disease | Admitting: Cardiovascular Disease

## 2013-08-27 ENCOUNTER — Encounter (HOSPITAL_COMMUNITY)
Admission: RE | Admit: 2013-08-27 | Discharge: 2013-08-27 | Disposition: A | Discharge status: Home / Self Care | Payer: Self-pay | Source: Ambulatory Visit | Attending: Cardiovascular Disease | Admitting: Cardiovascular Disease

## 2013-08-27 DIAGNOSIS — I252 Old myocardial infarction: Secondary | ICD-10-CM | POA: Insufficient documentation

## 2013-08-27 DIAGNOSIS — Z951 Presence of aortocoronary bypass graft: Secondary | ICD-10-CM | POA: Insufficient documentation

## 2013-08-27 DIAGNOSIS — Z5189 Encounter for other specified aftercare: Secondary | ICD-10-CM | POA: Insufficient documentation

## 2013-08-27 DIAGNOSIS — I2510 Atherosclerotic heart disease of native coronary artery without angina pectoris: Secondary | ICD-10-CM | POA: Insufficient documentation

## 2013-08-29 ENCOUNTER — Encounter (HOSPITAL_COMMUNITY)
Admission: RE | Admit: 2013-08-29 | Discharge: 2013-08-29 | Disposition: A | Discharge status: Home / Self Care | Payer: Self-pay | Source: Ambulatory Visit | Attending: Cardiovascular Disease | Admitting: Cardiovascular Disease

## 2013-08-31 ENCOUNTER — Encounter (HOSPITAL_COMMUNITY)
Admission: RE | Admit: 2013-08-31 | Discharge: 2013-08-31 | Disposition: A | Discharge status: Home / Self Care | Payer: Self-pay | Source: Ambulatory Visit | Attending: Cardiovascular Disease | Admitting: Cardiovascular Disease

## 2013-09-03 ENCOUNTER — Encounter (HOSPITAL_COMMUNITY)
Admission: RE | Admit: 2013-09-03 | Discharge: 2013-09-03 | Disposition: A | Discharge status: Home / Self Care | Payer: Self-pay | Source: Ambulatory Visit | Attending: Cardiovascular Disease | Admitting: Cardiovascular Disease

## 2013-09-05 ENCOUNTER — Encounter (HOSPITAL_COMMUNITY)
Admission: RE | Admit: 2013-09-05 | Discharge: 2013-09-05 | Disposition: A | Discharge status: Home / Self Care | Payer: Self-pay | Source: Ambulatory Visit | Attending: Cardiovascular Disease | Admitting: Cardiovascular Disease

## 2013-09-07 ENCOUNTER — Encounter (HOSPITAL_COMMUNITY): Payer: Self-pay

## 2013-09-10 ENCOUNTER — Encounter (HOSPITAL_COMMUNITY)
Admission: RE | Admit: 2013-09-10 | Discharge: 2013-09-10 | Disposition: A | Discharge status: Home / Self Care | Payer: Self-pay | Source: Ambulatory Visit | Attending: Cardiovascular Disease | Admitting: Cardiovascular Disease

## 2013-09-12 ENCOUNTER — Encounter (HOSPITAL_COMMUNITY)
Admission: RE | Admit: 2013-09-12 | Discharge: 2013-09-12 | Disposition: A | Discharge status: Home / Self Care | Payer: Self-pay | Source: Ambulatory Visit | Attending: Cardiovascular Disease | Admitting: Cardiovascular Disease

## 2013-09-14 ENCOUNTER — Encounter (HOSPITAL_COMMUNITY)
Admission: RE | Admit: 2013-09-14 | Discharge: 2013-09-14 | Disposition: A | Discharge status: Home / Self Care | Payer: Self-pay | Source: Ambulatory Visit | Attending: Cardiovascular Disease | Admitting: Cardiovascular Disease

## 2013-09-17 ENCOUNTER — Encounter (HOSPITAL_COMMUNITY)
Admission: RE | Admit: 2013-09-17 | Discharge: 2013-09-17 | Disposition: A | Discharge status: Home / Self Care | Payer: Self-pay | Source: Ambulatory Visit | Attending: Cardiovascular Disease | Admitting: Cardiovascular Disease

## 2013-09-19 ENCOUNTER — Encounter (HOSPITAL_COMMUNITY)
Admission: RE | Admit: 2013-09-19 | Discharge: 2013-09-19 | Disposition: A | Discharge status: Home / Self Care | Payer: Self-pay | Source: Ambulatory Visit | Attending: Cardiovascular Disease | Admitting: Cardiovascular Disease

## 2013-09-21 ENCOUNTER — Encounter (HOSPITAL_COMMUNITY)
Admission: RE | Admit: 2013-09-21 | Discharge: 2013-09-21 | Disposition: A | Discharge status: Home / Self Care | Payer: Self-pay | Source: Ambulatory Visit | Attending: Cardiovascular Disease | Admitting: Cardiovascular Disease

## 2013-09-24 ENCOUNTER — Encounter (HOSPITAL_COMMUNITY)
Admission: RE | Admit: 2013-09-24 | Discharge: 2013-09-24 | Disposition: A | Discharge status: Home / Self Care | Payer: Self-pay | Source: Ambulatory Visit | Attending: Cardiovascular Disease | Admitting: Cardiovascular Disease

## 2013-09-26 ENCOUNTER — Encounter (HOSPITAL_COMMUNITY)
Admission: RE | Admit: 2013-09-26 | Discharge: 2013-09-26 | Disposition: A | Discharge status: Home / Self Care | Payer: Self-pay | Source: Ambulatory Visit | Attending: Cardiovascular Disease | Admitting: Cardiovascular Disease

## 2013-09-26 DIAGNOSIS — I252 Old myocardial infarction: Secondary | ICD-10-CM | POA: Insufficient documentation

## 2013-09-26 DIAGNOSIS — Z951 Presence of aortocoronary bypass graft: Secondary | ICD-10-CM | POA: Insufficient documentation

## 2013-09-26 DIAGNOSIS — Z5189 Encounter for other specified aftercare: Secondary | ICD-10-CM | POA: Insufficient documentation

## 2013-09-26 DIAGNOSIS — I2510 Atherosclerotic heart disease of native coronary artery without angina pectoris: Secondary | ICD-10-CM | POA: Insufficient documentation

## 2013-09-28 ENCOUNTER — Encounter (HOSPITAL_COMMUNITY)
Admission: RE | Admit: 2013-09-28 | Discharge: 2013-09-28 | Disposition: A | Discharge status: Home / Self Care | Payer: Self-pay | Source: Ambulatory Visit | Attending: Cardiovascular Disease | Admitting: Cardiovascular Disease

## 2013-10-01 ENCOUNTER — Encounter (HOSPITAL_COMMUNITY)
Admission: RE | Admit: 2013-10-01 | Discharge: 2013-10-01 | Disposition: A | Discharge status: Home / Self Care | Payer: Self-pay | Source: Ambulatory Visit | Attending: Cardiovascular Disease | Admitting: Cardiovascular Disease

## 2013-10-03 ENCOUNTER — Encounter (HOSPITAL_COMMUNITY)
Admission: RE | Admit: 2013-10-03 | Discharge: 2013-10-03 | Disposition: A | Discharge status: Home / Self Care | Payer: Self-pay | Source: Ambulatory Visit | Attending: Cardiovascular Disease | Admitting: Cardiovascular Disease

## 2013-10-05 ENCOUNTER — Encounter (HOSPITAL_COMMUNITY)
Admission: RE | Admit: 2013-10-05 | Discharge: 2013-10-05 | Disposition: A | Discharge status: Home / Self Care | Payer: Self-pay | Source: Ambulatory Visit | Attending: Cardiovascular Disease | Admitting: Cardiovascular Disease

## 2013-10-08 ENCOUNTER — Encounter (HOSPITAL_COMMUNITY)
Admission: RE | Admit: 2013-10-08 | Discharge: 2013-10-08 | Disposition: A | Discharge status: Home / Self Care | Payer: Self-pay | Source: Ambulatory Visit | Attending: Cardiovascular Disease | Admitting: Cardiovascular Disease

## 2013-10-10 ENCOUNTER — Encounter (HOSPITAL_COMMUNITY)
Admission: RE | Admit: 2013-10-10 | Discharge: 2013-10-10 | Disposition: A | Discharge status: Home / Self Care | Payer: Self-pay | Source: Ambulatory Visit | Attending: Cardiovascular Disease | Admitting: Cardiovascular Disease

## 2013-10-12 ENCOUNTER — Encounter (HOSPITAL_COMMUNITY)
Admission: RE | Admit: 2013-10-12 | Discharge: 2013-10-12 | Disposition: A | Discharge status: Home / Self Care | Payer: Self-pay | Source: Ambulatory Visit | Attending: Cardiovascular Disease | Admitting: Cardiovascular Disease

## 2013-10-15 ENCOUNTER — Encounter (HOSPITAL_COMMUNITY)
Admission: RE | Admit: 2013-10-15 | Discharge: 2013-10-15 | Disposition: A | Discharge status: Home / Self Care | Payer: Self-pay | Source: Ambulatory Visit | Attending: Cardiovascular Disease | Admitting: Cardiovascular Disease

## 2013-10-17 ENCOUNTER — Encounter (HOSPITAL_COMMUNITY)
Admission: RE | Admit: 2013-10-17 | Discharge: 2013-10-17 | Disposition: A | Discharge status: Home / Self Care | Payer: Self-pay | Source: Ambulatory Visit | Attending: Cardiovascular Disease | Admitting: Cardiovascular Disease

## 2013-10-19 ENCOUNTER — Encounter (HOSPITAL_COMMUNITY)
Admission: RE | Admit: 2013-10-19 | Discharge: 2013-10-19 | Disposition: A | Discharge status: Home / Self Care | Payer: Self-pay | Source: Ambulatory Visit | Attending: Cardiovascular Disease | Admitting: Cardiovascular Disease

## 2013-10-22 ENCOUNTER — Encounter (HOSPITAL_COMMUNITY): Payer: Self-pay

## 2013-10-23 ENCOUNTER — Other Ambulatory Visit: Payer: Self-pay | Admitting: Cardiovascular Disease

## 2013-10-24 ENCOUNTER — Encounter (HOSPITAL_COMMUNITY)
Admission: RE | Admit: 2013-10-24 | Discharge: 2013-10-24 | Disposition: A | Discharge status: Home / Self Care | Payer: Self-pay | Source: Ambulatory Visit | Attending: Cardiovascular Disease | Admitting: Cardiovascular Disease

## 2013-10-25 ENCOUNTER — Ambulatory Visit (INDEPENDENT_AMBULATORY_CARE_PROVIDER_SITE_OTHER): Payer: Medicare Other | Admitting: Nurse Practitioner

## 2013-10-25 ENCOUNTER — Encounter: Payer: Self-pay | Admitting: Nurse Practitioner

## 2013-10-25 ENCOUNTER — Encounter (INDEPENDENT_AMBULATORY_CARE_PROVIDER_SITE_OTHER): Payer: Self-pay

## 2013-10-25 VITALS — BP 115/60 | HR 75 | Ht 72.0 in | Wt 234.0 lb

## 2013-10-25 DIAGNOSIS — Z5181 Encounter for therapeutic drug level monitoring: Secondary | ICD-10-CM

## 2013-10-25 DIAGNOSIS — Q8501 Neurofibromatosis, type 1: Secondary | ICD-10-CM

## 2013-10-25 DIAGNOSIS — G40209 Localization-related (focal) (partial) symptomatic epilepsy and epileptic syndromes with complex partial seizures, not intractable, without status epilepticus: Secondary | ICD-10-CM

## 2013-10-25 MED ORDER — CARBAMAZEPINE ER 200 MG PO TB12: 600.0000 mg | ORAL_TABLET | Freq: Two times a day (BID) | ORAL | Status: DC

## 2013-10-25 MED ORDER — LEVETIRACETAM 500 MG PO TABS: ORAL_TABLET | ORAL | Status: DC

## 2013-10-25 NOTE — Patient Instructions (Signed)
Fasting labs to be done at Dr. Laurey Morale  Office Will check Tegretol level today Continue Keppra at current dose Rx to  Patient Continue Tegretol brand at current dose Rx to patient Followup yearly and prn

## 2013-10-25 NOTE — Progress Notes (Signed)
I have read the note, and I agree with the clinical assessment and plan.  Brityn Mastrogiovanni K Chares Slaymaker   

## 2013-10-25 NOTE — Progress Notes (Signed)
[facility]  PATIENT: Carolanne GrumblingRichard N Eltzroth DOB: 10/01/1965   REASON FOR VISIT: Followup seizure disorder   HISTORY OF PRESENT ILLNESS: Mr. Tera HelperBoggs, 48 year old male returns for followup. He has a history of seizure disorder and neurofibromatosis type I. He denies that he is having any seizure activity since last seen. He is currently on Tegretol brand name and Keppra. The patient denies any gait instability, he has not fallen and he does not use an assistive device, he denies any numbness or weakness of the face arms or legs. He says he is using oxygen 2 L at night. He claims he has had insomnia since a teenager. He returns for reevaluation.    HISTORY:  of neurofibromatosis type I, and a history of seizures. The patient began noting intermittent tinnitus involving the left ear that began in October 2013. The patient has had some increase in frequency of these events. The patient was seen by Dr. Gean QuintWolicke, and audiometric testing was done. The patient was told there were some changes on the study. The patient has undergone MRI evaluation of the brain with a IAC study as well. 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 The patient denies any new numbness or weakness of the face, arms, or legs. The patient has had no headaches, but he occasionally will have some mild vertigo type sensations. The patient has not had any further seizures. The patient denies any gait instability. Patient underwent a brainstem auditory evoked response test at his last visit that was normal. There was no evidence of conduction slowing within the peripheral or central nervous system The patient returns for evaluation. He will be seen again by Dr. Gean QuintWolicke in December.      REVIEW OF SYSTEMS: Full 14 system review of systems performed and notable only for those listed, all others are neg:    Constitutional: Fatigue Cardiovascular: N/A  Ear/Nose/Throat: Hearing loss, ringing in the ears Skin: N/A  Eyes: Loss of vision  Respiratory: N/A  Gastroitestinal: N/A  Hematology/Lymphatic: N/A  Endocrine: N/A Musculoskeletal: Joint pain  Allergy/Immunology: N/A  Neurological: Occasional headache  Psychiatric: Depression anxiety Sleep sleep apnea, daytime sleepiness   ALLERGIES: Allergies  Allergen Reactions  . Latex Rash    HOME MEDICATIONS: Outpatient Prescriptions Prior to Visit  Medication Sig Dispense Refill  . ALPRAZolam (XANAX) 0.5 MG tablet Take 0.5 mg by mouth at bedtime as needed for anxiety.      [name]. aspirin 81 MG tablet Take 81 mg by mouth daily.       [name]. docusate sodium (COLACE) 100 MG capsule Take 100 mg by mouth daily.       [name]. ezetimibe (ZETIA) 10 MG tablet Take 10 mg by mouth at bedtime.       . Insulin Human (INSULIN PUMP) 100 unit/ml SOLN Inject into the skin continuous.       . isosorbide mononitrate (IMDUR) 60 MG 24 hr tablet Take 60 mg by mouth every morning.       . Lancets (ONETOUCH ULTRASOFT) lancets       . lisinopril (PRINIVIL,ZESTRIL) 5 MG tablet Take 5 mg by mouth daily.      . metoprolol tartrate (LOPRESSOR) 25 MG tablet Take 25 mg by mouth 2 (two) times daily.      . multivitamin (ONE-A-DAY MEN'S) TABS Take 1 tablet by mouth daily.       .[name]  nitroGLYCERIN (NITROSTAT) 0.4 MG SL tablet Place 1 tablet (0.4 mg total) under the tongue every 5 (five) minutes as needed for chest pain.  25 tablet  3  . NOVOLOG 100 UNIT/ML injection Novolog Insulin Basal Rate as follows: 12 am .9 units, 6 am 1.9 units, 10 am 2.0 units, 12 pm 2.2 units, 6 pm 2.2 units      . ONE TOUCH ULTRA TEST test strip       . rosuvastatin (CRESTOR) 40 MG tablet Take 40 mg by mouth at bedtime.       . sertraline (ZOLOFT) 100 MG tablet Take 100 mg by mouth at bedtime. One and half tablet at bedtime      . vitamin B-12 (CYANOCOBALAMIN) 500 MCG tablet Take 500 mcg by mouth daily.      .  carbamazepine (TEGRETOL XR) 200 MG 12 hr tablet Take 600 mg by mouth 2 (two) times daily. Brand is medically necessary.      . levETIRAcetam (KEPPRA) 500 MG tablet Take 250 mg every morning and 500 mg every evening.  45 tablet  6  . metoprolol tartrate (LOPRESSOR) 25 MG tablet take 1 tablet by mouth twice a day  60 tablet  0   No facility-administered medications prior to visit.    PAST MEDICAL HISTORY: Past Medical History  Diagnosis Date  . CAD (coronary artery disease)     Cath 2011.  LAD stent patent, distal LAD occlusion, D1 100%, OM branch 80 - 90%, SVG to diag patent, LIMA to LAD occluded, SVG to RCA occluded, SVG to OM was occluded.   No change from previous cath.  Managed medically.   . Diabetes mellitus type I     on insulin pump at  home  . Sleep apnea     s/p oral surgery  . Hyperlipidemia   . 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 Diabetic retinopathy     s/p vitrectomy and history of retinal surgery  . HTN (hypertension)   . Seizure disorder   . Seasonal allergic rhinitis   . Depression   . Cataract   . Hemorrhoid   . Myocardial infarction   . Anxiety     PAST SURGICAL HISTORY: Past Surgical History  Procedure Laterality Date  . Coronary artery bypass graft      4 vessel 4/11  . Uvulopalatoplasty surgery    . Tonsillectomy    . Release of right transverse carpal ligament    . Right eye vitrectomy and detached retina repair  10/2007  . Laparoscopic appendectomy  11/24/2011  . Appendectomy      FAMILY HISTORY: Family History  Problem Relation Age of Onset  . Coronary artery disease Father 24  . Diabetes Father     type II  . Hypertension Father   . Squamous cell carcinoma Brother   . Arthritis Sister     SOCIAL HISTORY: History   Social History  . Marital Status: Married    Spouse Name: Almyra Free    Number of Children: 0  . Years of Education: college   Occupational History  . Unemployed     previously worked  in [employer]   Social History Main Topics  . Smoking status: Never Smoker   . Smokeless tobacco: Never Used  . Alcohol Use: No  . Drug Use: No  . Sexual Activity: Yes   Other Topics Concern  . Not on file   Social History Narrative  Patient lives with his wifeLanora Manis).   Patient does not have any children.   Patient is on disability.   Patient is left-handed (writes), but right hand with sports.   Patient has a college education.   Patient drinks 4 cans of Diet Coke daily.     PHYSICAL EXAM  Filed Vitals:   10/25/13 1324  BP: 115/60  Pulse: 75  Height: 6' (1.829 m)  Weight: 234 lb (106.142 kg)   Body mass index is 31.73 kg/(m^2).  Generalized: Well developed, moderately obese male in no acute distress  Musculoskeletal: No deformity   Neurological examination   Mentation: Alert oriented to time, place, history taking. Follows all commands speech and language fluent  Cranial nerve II-XII: Pupils were equal round reactive to light extraocular movements were full, visual field were full on confrontational test. Facial sensation and strength were normal. hearing was intact to finger rubbing bilaterally. Uvula tongue midline. head turning and shoulder shrug were normal and symmetric.Tongue protrusion into cheek strength was normal. Motor: normal bulk and tone, full strength in the BUE, BLE, fine finger movements normal, no pronator drift. No focal weakness Coordination: finger-nose-finger, heel-to-shin bilaterally, no dysmetria Reflexes: Symmetric upper and lower but depressed, plantar responses were flexor bilaterally. Gait and Station: Rising up from seated position without assistance, normal stance,  moderate stride, good arm swing, smooth turning, able to perform tiptoe, and heel walking without difficulty. Tandem gait is steady  DIAGNOSTIC DATA (LABS, IMAGING, TESTING) - I reviewed patient records, labs, notes, testing and imaging myself where available.  Lab Results    Component Value Date   WBC 5.6 02/16/2013   HGB 14.6 02/16/2013   HCT 42.4 02/16/2013   MCV 90.1 02/16/2013   PLT 206.0 02/16/2013      Component Value Date/Time   NA 136 02/16/2013 1232   K 4.5 02/16/2013 1232   CL 105 02/16/2013 1232   CO2 25 02/16/2013 1232   GLUCOSE 219* 02/16/2013 1232   BUN 15 02/16/2013 1232   CREATININE 0.8 02/16/2013 1232   CALCIUM 8.8 02/16/2013 1232   PROT 6.9 02/26/2012 0034   ALBUMIN 4.0 02/26/2012 0034   AST 23 02/26/2012 0034   ALT 32 02/26/2012 0034   ALKPHOS 87 02/26/2012 0034   BILITOT 0.2* 02/26/2012 0034   GFRNONAA >90 02/26/2012 0034   GFRAA >90 02/26/2012 0034    ASSESSMENT AND PLAN  48 y.o. year old male  has a past medical history of CAD (coronary artery disease); Diabetes mellitus type I; Sleep apnea; Hyperlipidemia; Neurofibromatosis; Diabetic retinopathy; HTN (hypertension); Seizure disorder; Myocardial infarction; and Anxiety. here to followup for seizure disorder. Fasting labs to be done at Dr. Laurey Morale  office in 2 weeks Will check Tegretol level today Continue Keppra at current dose Rx to  Patient Continue Tegretol brand at current dose Rx to patient Followup yearly and prn Nilda Riggs, [facility], [facility], APRN  [facility] [address] [place], [place] [address] [phone]

## 2013-10-26 ENCOUNTER — Encounter (HOSPITAL_COMMUNITY)
Admission: RE | Admit: 2013-10-26 | Discharge: 2013-10-26 | Disposition: A | Discharge status: Home / Self Care | Payer: Self-pay | Source: Ambulatory Visit | Attending: Cardiovascular Disease | Admitting: Cardiovascular Disease

## 2013-10-26 DIAGNOSIS — I252 Old myocardial infarction: Secondary | ICD-10-CM | POA: Insufficient documentation

## 2013-10-26 DIAGNOSIS — Z951 Presence of aortocoronary bypass graft: Secondary | ICD-10-CM | POA: Insufficient documentation

## 2013-10-26 DIAGNOSIS — Z5189 Encounter for other specified aftercare: Secondary | ICD-10-CM | POA: Insufficient documentation

## 2013-10-26 DIAGNOSIS — I2510 Atherosclerotic heart disease of native coronary artery without angina pectoris: Secondary | ICD-10-CM | POA: Insufficient documentation

## 2013-10-26 LAB — CARBAMAZEPINE LEVEL, TOTAL: CARBAMAZEPINE LVL: 7.8 ug/mL (ref 4.0–12.0)

## 2013-10-26 NOTE — Progress Notes (Signed)
Quick Note:  I called and LMVM mobile [name]) with normal tegretol level. Pt to return call if questions. ______

## 2013-10-29 ENCOUNTER — Encounter (HOSPITAL_COMMUNITY)
Admission: RE | Admit: 2013-10-29 | Discharge: 2013-10-29 | Disposition: A | Discharge status: Home / Self Care | Payer: Self-pay | Source: Ambulatory Visit | Attending: Cardiovascular Disease | Admitting: Cardiovascular Disease

## 2013-10-31 ENCOUNTER — Encounter (HOSPITAL_COMMUNITY)
Admission: RE | Admit: 2013-10-31 | Discharge: 2013-10-31 | Disposition: A | Discharge status: Home / Self Care | Payer: Self-pay | Source: Ambulatory Visit | Attending: Cardiovascular Disease | Admitting: Cardiovascular Disease

## 2013-11-02 ENCOUNTER — Encounter (HOSPITAL_COMMUNITY)
Admission: RE | Admit: 2013-11-02 | Discharge: 2013-11-02 | Disposition: A | Discharge status: Home / Self Care | Payer: Self-pay | Source: Ambulatory Visit | Attending: Cardiovascular Disease | Admitting: Cardiovascular Disease

## 2013-11-05 ENCOUNTER — Encounter (HOSPITAL_COMMUNITY)
Admission: RE | Admit: 2013-11-05 | Discharge: 2013-11-05 | Disposition: A | Discharge status: Home / Self Care | Payer: Self-pay | Source: Ambulatory Visit | Attending: Cardiovascular Disease | Admitting: Cardiovascular Disease

## 2013-11-07 ENCOUNTER — Encounter (HOSPITAL_COMMUNITY): Payer: Self-pay

## 2013-11-09 ENCOUNTER — Encounter: Payer: Self-pay | Admitting: Cardiovascular Disease

## 2013-11-09 ENCOUNTER — Ambulatory Visit (INDEPENDENT_AMBULATORY_CARE_PROVIDER_SITE_OTHER): Payer: Medicare Other | Admitting: Cardiovascular Disease

## 2013-11-09 ENCOUNTER — Encounter (HOSPITAL_COMMUNITY)
Admission: RE | Admit: 2013-11-09 | Discharge: 2013-11-09 | Disposition: A | Discharge status: Home / Self Care | Payer: Self-pay | Source: Ambulatory Visit | Attending: Cardiovascular Disease | Admitting: Cardiovascular Disease

## 2013-11-09 VITALS — BP 122/70 | HR 69 | Ht 71.0 in | Wt 236.0 lb

## 2013-11-09 DIAGNOSIS — I25810 Atherosclerosis of coronary artery bypass graft(s) without angina pectoris: Secondary | ICD-10-CM

## 2013-11-09 DIAGNOSIS — E785 Hyperlipidemia, unspecified: Secondary | ICD-10-CM

## 2013-11-09 NOTE — Progress Notes (Signed)
HPI:  48 year old gentleman presenting for followup evaluation. He's followed for coronary artery disease with history of CABG and early bypass graft failure. He was treated with multivessel PCI following CABG. He presented in August 2014 with worsening chest discomfort and underwent cardiac catheterization. This showed continued patency of his stent sites and continued patency of his diagonal vein graft. There was a pattern of diffuse small vessel disease noted and medical therapy was continued. LV function has been normal.  He reports no significant change in symptoms. He continues to work regularly with the maintenance phase of cardiac rehabilitation. He has been able to increase the incline on the treadmill to 10%. He has occasional right lower chest pain at rest, but has not experienced exertional chest tightness, burning, or pressure. He denies dyspnea, edema, or palpitations. He is compliant with his medications.  Outpatient Encounter Prescriptions as of 11/09/2013  Medication Sig  . ALPRAZolam (XANAX) 0.5 MG tablet Take 0.5 mg by mouth at bedtime as needed for anxiety.  [name]. aspirin 81 MG tablet Take 81 mg by mouth daily.   . carbamazepine (TEGRETOL XR) 200 MG 12 hr tablet Take 3 tablets (600 mg total) by mouth 2 (two) times daily. Brand is medically necessary.  . docusate sodium (COLACE) 100 MG capsule Take 100 mg by mouth daily.   [name]. ezetimibe (ZETIA) 10 MG tablet Take 10 mg by mouth at bedtime.   . Insulin Human (INSULIN PUMP) 100 unit/ml SOLN Inject into the skin continuous.   . isosorbide mononitrate (IMDUR) 60 MG 24 hr tablet Take 60 mg by mouth every morning.   . Lancets (ONETOUCH ULTRASOFT) lancets   . levETIRAcetam (KEPPRA) 500 MG tablet Take 250 mg every morning and 500 mg every evening.  [name]. lisinopril (PRINIVIL,ZESTRIL) 5 MG tablet Take 5 mg by mouth daily.  . metoprolol tartrate (LOPRESSOR) 25 MG tablet Take 25 mg by mouth 2 (two) times daily.  . multivitamin (ONE-A-DAY MEN'S)  TABS Take 1 tablet by mouth daily.   . nitroGLYCERIN (NITROSTAT) 0.4 MG SL tablet Place 1 tablet (0.4 mg total) under the tongue every 5 (five) minutes as needed for chest pain.  [name]. NOVOLOG 100 UNIT/ML injection Novolog Insulin Basal Rate as follows: 12 am .9 units, 6 am 1.9 units, 10 am 2.0 units, 12 pm 2.2 units, 6 pm 2.2 units  . ONE TOUCH ULTRA TEST test strip   . rosuvastatin (CRESTOR) 40 MG tablet Take 40 mg by mouth at bedtime.   . sertraline (ZOLOFT) 100 MG tablet Take 100 mg by mouth at bedtime. One and half tablet at bedtime  . vitamin B-12 (CYANOCOBALAMIN) 500 MCG tablet Take 500 mcg by mouth daily.  . [DISCONTINUED] amoxicillin (AMOXIL) 500 MG capsule     Allergies  Allergen Reactions  . Latex Rash    Past Medical History  Diagnosis Date  . CAD (coronary artery disease)     Cath 2011.  LAD stent patent, distal LAD occlusion, D1 100%, OM branch 80 - 90%, SVG to diag patent, LIMA to LAD occluded, SVG to RCA occluded, SVG to OM was occluded.   No change from previous cath.  Managed medically.   . Diabetes mellitus type I     on insulin pump at  home  . Sleep apnea     s/p oral surgery  . Hyperlipidemia   . 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 Diabetic retinopathy     s/p vitrectomy  and history of retinal surgery  . HTN (hypertension)   . Seizure disorder   . Seasonal allergic rhinitis   . Depression   . Cataract   . Hemorrhoid   . Myocardial infarction   . Anxiety    ROS: Negative except as per HPI  BP 122/70  Pulse 69  Ht 5\' 11"  (1.803 m)  Wt 107.049 kg (236 lb)  BMI 32.93 kg/m2  PHYSICAL EXAM: Pt is alert and oriented, pleasant overweight male in NAD HEENT: normal Neck: JVP - normal, carotids 2+= without bruits Lungs: CTA bilaterally CV: RRR without murmur or gallop Abd: soft, NT, Positive BS, no hepatomegaly Ext: no C/C/E, distal pulses intact and equal Skin: warm/dry no rash  EKG:  Normal sinus rhythm, rightward  axis, mild inferior ST elevation unchanged from previous tracings.  ASSESSMENT AND PLAN: 1. Coronary artery disease, native vessel and graft disease. The patient is stable without significant angina on exertion. His medical program includes aspirin, metoprolol, and isosorbide. He will continue on the same medicines without change. I will see him back in 6 months. We discussed the importance of dietary modification and goals for weight loss.  2. Hypertension. Blood pressure is well controlled on lisinopril, metoprolol, and isosorbide.  3. Hyperlipidemia. He continues on rosuvastatin and zetia. Lipids are followed by Dr. Waynard Edwards. He has upcoming lab work with his annual physical exam.  Tonny Bollman 11/09/2013 8:17 AM

## 2013-11-09 NOTE — Patient Instructions (Signed)
Your physician wants you to follow-up in: 6 MONTHS with Dr Cooper.  You will receive a reminder letter in the mail two months in advance. If you don't receive a letter, please call our office to schedule the follow-up appointment.  Your physician recommends that you continue on your current medications as directed. Please refer to the Current Medication list given to you today.  

## 2013-11-12 ENCOUNTER — Encounter (HOSPITAL_COMMUNITY)
Admission: RE | Admit: 2013-11-12 | Discharge: 2013-11-12 | Disposition: A | Discharge status: Home / Self Care | Payer: Self-pay | Source: Ambulatory Visit | Attending: Cardiovascular Disease | Admitting: Cardiovascular Disease

## 2013-11-13 ENCOUNTER — Encounter (HOSPITAL_COMMUNITY)
Admission: RE | Admit: 2013-11-13 | Discharge: 2013-11-13 | Disposition: A | Discharge status: Home / Self Care | Payer: Self-pay | Source: Ambulatory Visit | Attending: Cardiovascular Disease | Admitting: Cardiovascular Disease

## 2013-11-14 ENCOUNTER — Encounter (HOSPITAL_COMMUNITY)
Admission: RE | Admit: 2013-11-14 | Discharge: 2013-11-14 | Disposition: A | Discharge status: Home / Self Care | Payer: Self-pay | Source: Ambulatory Visit | Attending: Cardiovascular Disease | Admitting: Cardiovascular Disease

## 2013-11-16 ENCOUNTER — Encounter (HOSPITAL_COMMUNITY): Payer: Self-pay

## 2013-11-21 ENCOUNTER — Other Ambulatory Visit: Payer: Self-pay | Admitting: Cardiovascular Disease

## 2013-11-21 ENCOUNTER — Encounter (HOSPITAL_COMMUNITY)
Admission: RE | Admit: 2013-11-21 | Discharge: 2013-11-21 | Disposition: A | Discharge status: Home / Self Care | Payer: Self-pay | Source: Ambulatory Visit | Attending: Cardiovascular Disease | Admitting: Cardiovascular Disease

## 2013-11-23 ENCOUNTER — Encounter (HOSPITAL_COMMUNITY)
Admission: RE | Admit: 2013-11-23 | Discharge: 2013-11-23 | Disposition: A | Discharge status: Home / Self Care | Payer: Self-pay | Source: Ambulatory Visit | Attending: Cardiovascular Disease | Admitting: Cardiovascular Disease

## 2013-11-26 ENCOUNTER — Encounter (HOSPITAL_COMMUNITY)
Admission: RE | Admit: 2013-11-26 | Discharge: 2013-11-26 | Disposition: A | Discharge status: Home / Self Care | Payer: Self-pay | Source: Ambulatory Visit | Attending: Cardiovascular Disease | Admitting: Cardiovascular Disease

## 2013-11-26 DIAGNOSIS — E785 Hyperlipidemia, unspecified: Secondary | ICD-10-CM | POA: Insufficient documentation

## 2013-11-26 DIAGNOSIS — I214 Non-ST elevation (NSTEMI) myocardial infarction: Secondary | ICD-10-CM | POA: Insufficient documentation

## 2013-11-26 DIAGNOSIS — Z5189 Encounter for other specified aftercare: Secondary | ICD-10-CM | POA: Insufficient documentation

## 2013-11-26 DIAGNOSIS — I25810 Atherosclerosis of coronary artery bypass graft(s) without angina pectoris: Secondary | ICD-10-CM | POA: Insufficient documentation

## 2013-11-28 ENCOUNTER — Encounter (HOSPITAL_COMMUNITY)
Admission: RE | Admit: 2013-11-28 | Discharge: 2013-11-28 | Disposition: A | Discharge status: Home / Self Care | Payer: Self-pay | Source: Ambulatory Visit | Attending: Cardiovascular Disease | Admitting: Cardiovascular Disease

## 2013-11-30 ENCOUNTER — Encounter (HOSPITAL_COMMUNITY)
Admission: RE | Admit: 2013-11-30 | Discharge: 2013-11-30 | Disposition: A | Discharge status: Home / Self Care | Payer: Self-pay | Source: Ambulatory Visit | Attending: Cardiovascular Disease | Admitting: Cardiovascular Disease

## 2013-12-03 ENCOUNTER — Encounter (HOSPITAL_COMMUNITY)
Admission: RE | Admit: 2013-12-03 | Discharge: 2013-12-03 | Disposition: A | Discharge status: Home / Self Care | Payer: Self-pay | Source: Ambulatory Visit | Attending: Cardiovascular Disease | Admitting: Cardiovascular Disease

## 2013-12-04 ENCOUNTER — Encounter: Payer: Self-pay | Admitting: Cardiovascular Disease

## 2013-12-05 ENCOUNTER — Encounter (HOSPITAL_COMMUNITY)
Admission: RE | Admit: 2013-12-05 | Discharge: 2013-12-05 | Disposition: A | Discharge status: Home / Self Care | Payer: Self-pay | Source: Ambulatory Visit | Attending: Cardiovascular Disease | Admitting: Cardiovascular Disease

## 2013-12-07 ENCOUNTER — Encounter (HOSPITAL_COMMUNITY)
Admission: RE | Admit: 2013-12-07 | Discharge: 2013-12-07 | Disposition: A | Discharge status: Home / Self Care | Payer: Self-pay | Source: Ambulatory Visit | Attending: Cardiovascular Disease | Admitting: Cardiovascular Disease

## 2013-12-10 ENCOUNTER — Encounter (HOSPITAL_COMMUNITY)
Admission: RE | Admit: 2013-12-10 | Discharge: 2013-12-10 | Disposition: A | Discharge status: Home / Self Care | Payer: Self-pay | Source: Ambulatory Visit | Attending: Cardiovascular Disease | Admitting: Cardiovascular Disease

## 2013-12-12 ENCOUNTER — Encounter (HOSPITAL_COMMUNITY)
Admission: RE | Admit: 2013-12-12 | Discharge: 2013-12-12 | Disposition: A | Discharge status: Home / Self Care | Payer: Self-pay | Source: Ambulatory Visit | Attending: Cardiovascular Disease | Admitting: Cardiovascular Disease

## 2013-12-14 ENCOUNTER — Encounter (HOSPITAL_COMMUNITY)
Admission: RE | Admit: 2013-12-14 | Discharge: 2013-12-14 | Disposition: A | Discharge status: Home / Self Care | Payer: Self-pay | Source: Ambulatory Visit | Attending: Cardiovascular Disease | Admitting: Cardiovascular Disease

## 2013-12-17 ENCOUNTER — Encounter (HOSPITAL_COMMUNITY)
Admission: RE | Admit: 2013-12-17 | Discharge: 2013-12-17 | Disposition: A | Discharge status: Home / Self Care | Payer: Self-pay | Source: Ambulatory Visit | Attending: Cardiovascular Disease | Admitting: Cardiovascular Disease

## 2013-12-19 ENCOUNTER — Encounter (HOSPITAL_COMMUNITY)
Admission: RE | Admit: 2013-12-19 | Discharge: 2013-12-19 | Disposition: A | Discharge status: Home / Self Care | Payer: Self-pay | Source: Ambulatory Visit | Attending: Cardiovascular Disease | Admitting: Cardiovascular Disease

## 2013-12-21 ENCOUNTER — Encounter (HOSPITAL_COMMUNITY)
Admission: RE | Admit: 2013-12-21 | Discharge: 2013-12-21 | Disposition: A | Discharge status: Home / Self Care | Payer: Self-pay | Source: Ambulatory Visit | Attending: Cardiovascular Disease | Admitting: Cardiovascular Disease

## 2013-12-24 ENCOUNTER — Encounter (HOSPITAL_COMMUNITY)
Admission: RE | Admit: 2013-12-24 | Discharge: 2013-12-24 | Disposition: A | Discharge status: Home / Self Care | Payer: Self-pay | Source: Ambulatory Visit | Attending: Cardiovascular Disease | Admitting: Cardiovascular Disease

## 2013-12-26 ENCOUNTER — Encounter (HOSPITAL_COMMUNITY)
Admission: RE | Admit: 2013-12-26 | Discharge: 2013-12-26 | Disposition: A | Discharge status: Home / Self Care | Payer: Self-pay | Source: Ambulatory Visit | Attending: Cardiovascular Disease | Admitting: Cardiovascular Disease

## 2013-12-26 DIAGNOSIS — I25810 Atherosclerosis of coronary artery bypass graft(s) without angina pectoris: Secondary | ICD-10-CM | POA: Insufficient documentation

## 2013-12-26 DIAGNOSIS — E785 Hyperlipidemia, unspecified: Secondary | ICD-10-CM | POA: Insufficient documentation

## 2013-12-26 DIAGNOSIS — Z5189 Encounter for other specified aftercare: Secondary | ICD-10-CM | POA: Insufficient documentation

## 2013-12-26 DIAGNOSIS — I214 Non-ST elevation (NSTEMI) myocardial infarction: Secondary | ICD-10-CM | POA: Insufficient documentation

## 2013-12-31 ENCOUNTER — Encounter (HOSPITAL_COMMUNITY)
Admission: RE | Admit: 2013-12-31 | Discharge: 2013-12-31 | Disposition: A | Discharge status: Home / Self Care | Payer: Self-pay | Source: Ambulatory Visit | Attending: Cardiovascular Disease | Admitting: Cardiovascular Disease

## 2014-01-02 ENCOUNTER — Encounter (HOSPITAL_COMMUNITY)
Admission: RE | Admit: 2014-01-02 | Discharge: 2014-01-02 | Disposition: A | Discharge status: Home / Self Care | Payer: Self-pay | Source: Ambulatory Visit | Attending: Cardiovascular Disease | Admitting: Cardiovascular Disease

## 2014-01-04 ENCOUNTER — Encounter (HOSPITAL_COMMUNITY)
Admission: RE | Admit: 2014-01-04 | Discharge: 2014-01-04 | Disposition: A | Discharge status: Home / Self Care | Payer: Self-pay | Source: Ambulatory Visit | Attending: Cardiovascular Disease | Admitting: Cardiovascular Disease

## 2014-01-07 ENCOUNTER — Encounter (HOSPITAL_COMMUNITY)
Admission: RE | Admit: 2014-01-07 | Discharge: 2014-01-07 | Disposition: A | Discharge status: Home / Self Care | Payer: Self-pay | Source: Ambulatory Visit | Attending: Cardiovascular Disease | Admitting: Cardiovascular Disease

## 2014-01-09 ENCOUNTER — Encounter (HOSPITAL_COMMUNITY)
Admission: RE | Admit: 2014-01-09 | Discharge: 2014-01-09 | Disposition: A | Discharge status: Home / Self Care | Payer: Self-pay | Source: Ambulatory Visit | Attending: Cardiovascular Disease | Admitting: Cardiovascular Disease

## 2014-01-11 ENCOUNTER — Encounter (HOSPITAL_COMMUNITY)
Admission: RE | Admit: 2014-01-11 | Discharge: 2014-01-11 | Disposition: A | Discharge status: Home / Self Care | Payer: Self-pay | Source: Ambulatory Visit | Attending: Cardiovascular Disease | Admitting: Cardiovascular Disease

## 2014-01-14 ENCOUNTER — Encounter (HOSPITAL_COMMUNITY)
Admission: RE | Admit: 2014-01-14 | Discharge: 2014-01-14 | Disposition: A | Discharge status: Home / Self Care | Payer: Self-pay | Source: Ambulatory Visit | Attending: Cardiovascular Disease | Admitting: Cardiovascular Disease

## 2014-01-16 ENCOUNTER — Encounter (HOSPITAL_COMMUNITY)
Admission: RE | Admit: 2014-01-16 | Discharge: 2014-01-16 | Disposition: A | Discharge status: Home / Self Care | Payer: Self-pay | Source: Ambulatory Visit | Attending: Cardiovascular Disease | Admitting: Cardiovascular Disease

## 2014-01-18 ENCOUNTER — Encounter (HOSPITAL_COMMUNITY)
Admission: RE | Admit: 2014-01-18 | Discharge: 2014-01-18 | Disposition: A | Discharge status: Home / Self Care | Payer: Self-pay | Source: Ambulatory Visit | Attending: Cardiovascular Disease | Admitting: Cardiovascular Disease

## 2014-01-18 ENCOUNTER — Emergency Department (HOSPITAL_COMMUNITY)
Admission: EM | Admit: 2014-01-18 | Discharge: 2014-01-18 | Disposition: A | Discharge status: Home / Self Care | Payer: Medicare Other | Attending: Emergency Medicine | Admitting: Emergency Medicine

## 2014-01-18 ENCOUNTER — Other Ambulatory Visit: Payer: Self-pay

## 2014-01-18 ENCOUNTER — Emergency Department (HOSPITAL_COMMUNITY): Payer: Medicare Other

## 2014-01-18 ENCOUNTER — Encounter (HOSPITAL_COMMUNITY): Payer: Self-pay | Admitting: Emergency Medicine

## 2014-01-18 DIAGNOSIS — Z91040 Latex allergy status: Secondary | ICD-10-CM | POA: Insufficient documentation

## 2014-01-18 DIAGNOSIS — F411 Generalized anxiety disorder: Secondary | ICD-10-CM | POA: Diagnosis not present

## 2014-01-18 DIAGNOSIS — E11319 Type 2 diabetes mellitus with unspecified diabetic retinopathy without macular edema: Secondary | ICD-10-CM | POA: Insufficient documentation

## 2014-01-18 DIAGNOSIS — R079 Chest pain, unspecified: Secondary | ICD-10-CM

## 2014-01-18 DIAGNOSIS — E785 Hyperlipidemia, unspecified: Secondary | ICD-10-CM | POA: Diagnosis not present

## 2014-01-18 DIAGNOSIS — I10 Essential (primary) hypertension: Secondary | ICD-10-CM | POA: Insufficient documentation

## 2014-01-18 DIAGNOSIS — Z951 Presence of aortocoronary bypass graft: Secondary | ICD-10-CM | POA: Insufficient documentation

## 2014-01-18 DIAGNOSIS — E109 Type 1 diabetes mellitus without complications: Secondary | ICD-10-CM | POA: Insufficient documentation

## 2014-01-18 DIAGNOSIS — Z79899 Other long term (current) drug therapy: Secondary | ICD-10-CM | POA: Insufficient documentation

## 2014-01-18 DIAGNOSIS — F329 Major depressive disorder, single episode, unspecified: Secondary | ICD-10-CM | POA: Insufficient documentation

## 2014-01-18 DIAGNOSIS — G43909 Migraine, unspecified, not intractable, without status migrainosus: Secondary | ICD-10-CM | POA: Diagnosis not present

## 2014-01-18 DIAGNOSIS — I2510 Atherosclerotic heart disease of native coronary artery without angina pectoris: Secondary | ICD-10-CM | POA: Insufficient documentation

## 2014-01-18 DIAGNOSIS — G40909 Epilepsy, unspecified, not intractable, without status epilepticus: Secondary | ICD-10-CM | POA: Insufficient documentation

## 2014-01-18 DIAGNOSIS — E1039 Type 1 diabetes mellitus with other diabetic ophthalmic complication: Secondary | ICD-10-CM | POA: Diagnosis not present

## 2014-01-18 DIAGNOSIS — Z7982 Long term (current) use of aspirin: Secondary | ICD-10-CM | POA: Insufficient documentation

## 2014-01-18 DIAGNOSIS — I252 Old myocardial infarction: Secondary | ICD-10-CM | POA: Insufficient documentation

## 2014-01-18 DIAGNOSIS — F3289 Other specified depressive episodes: Secondary | ICD-10-CM | POA: Insufficient documentation

## 2014-01-18 DIAGNOSIS — Z794 Long term (current) use of insulin: Secondary | ICD-10-CM | POA: Insufficient documentation

## 2014-01-18 LAB — BASIC METABOLIC PANEL
ANION GAP: 14 (ref 5–15)
BUN: 11 mg/dL (ref 6–23)
CALCIUM: 8.9 mg/dL (ref 8.4–10.5)
CO2: 26 meq/L (ref 19–32)
CREATININE: 0.62 mg/dL (ref 0.50–1.35)
Chloride: 101 mEq/L (ref 96–112)
GFR calc Af Amer: >90 mL/min (ref >90)
GFR calc non Af Amer: >90 mL/min (ref >90)
Glucose, Bld: 238 mg/dL — ABNORMAL HIGH (ref 70–99)
Potassium: 4.3 mEq/L (ref 3.7–5.3)
Sodium: 141 mEq/L (ref 137–147)

## 2014-01-18 LAB — CBC
HEMATOCRIT: 42.4 % (ref 39.0–52.0)
Hemoglobin: 14.7 g/dL (ref 13.0–17.0)
MCH: 31.3 pg (ref 26.0–34.0)
MCHC: 34.7 g/dL (ref 30.0–36.0)
MCV: 90.4 fL (ref 78.0–100.0)
Platelets: 162 10*3/uL (ref 150–400)
RBC: 4.69 MIL/uL (ref 4.22–5.81)
RDW: 12.6 % (ref 11.5–15.5)
WBC: 4 10*3/uL (ref 4.0–10.5)

## 2014-01-18 LAB — POCT I-STAT TROPONIN I
TROPONIN I, POC: 0 ng/mL (ref 0.00–0.08)
Troponin i, poc: 0 ng/mL (ref 0.00–0.08)

## 2014-01-18 MED ORDER — ASPIRIN 81 MG PO CHEW
243.0000 mg | CHEWABLE_TABLET | Freq: Once | ORAL | Status: AC
  Administered 2014-01-18: 243 mg via ORAL
  Filled 2014-01-18: qty 3

## 2014-01-18 MED ORDER — MORPHINE SULFATE 4 MG/ML IJ SOLN
4.0000 mg | Freq: Once | INTRAMUSCULAR | Status: AC
  Administered 2014-01-18: 4 mg via INTRAVENOUS
  Filled 2014-01-18: qty 1

## 2014-01-18 NOTE — ED Provider Notes (Signed)
CSN: [id number]     Arrival date & time 01/18/14  1445 History   First MD Initiated Contact with Patient 01/18/14 1502     Chief Complaint  Patient presents with  . Chest Pain     (Consider location/radiation/quality/duration/timing/severity/associated sxs/prior Treatment) HPI Comments: Patient presents emergency department with chief complaint of chest pain. Patient starts the pain started approximately 10:30 AM this morning. It has been intermittent. Currently he rates his pain as a 1/10. He states that he contacted his primary care provider, and was instructed to go to cardiac rehabilitation today. While he was walking from the parking onto the rehabilitation center, he states that the chest pain significantly worsened. He denies shortness of breath. States that after she got to the rehabilitation center, and sat down, the pain lessened. He denies any radiating symptoms. Denies any diaphoresis. His past medical history remarkable for CAD with stents placed in 2011, hypertension, diabetes, and hyperlipidemia. Patient's cardiologist is Dr. Excell Seltzerooper. Patient has taken a baby aspirin today. He has not taken anything to alleviate his symptoms.  The history is provided by the patient. No language interpreter was used.    Past Medical History  Diagnosis Date  . CAD (coronary artery disease)     Cath 2011.  LAD stent patent, distal LAD occlusion, D1 100%, OM branch 80 - 90%, SVG to diag patent, LIMA to LAD occluded, SVG to RCA occluded, SVG to OM was occluded.   No change from previous cath.  Managed medically.   . Diabetes mellitus type I     on insulin pump at  home  . Sleep apnea     s/p oral surgery  . Hyperlipidemia   . 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 Diabetic retinopathy     s/p vitrectomy and history of retinal surgery  . HTN (hypertension)   . Seizure disorder   . Seasonal allergic rhinitis   . Depression   . Cataract   . Hemorrhoid   .  Myocardial infarction   . Anxiety    Past Surgical History  Procedure Laterality Date  . Coronary artery bypass graft      4 vessel 4/11  . Uvulopalatoplasty surgery    . Tonsillectomy    . Release of right transverse carpal ligament    . Right eye vitrectomy and detached retina repair  10/2007  . Laparoscopic appendectomy  11/24/2011  . Appendectomy     Family History  Problem Relation Age of Onset  . Coronary artery disease Father 3650  . Diabetes Father     type II  . Hypertension Father   . Squamous cell carcinoma Brother   . Arthritis Sister    History  Substance Use Topics  . Smoking status: Never Smoker   . Smokeless tobacco: Never Used  . Alcohol Use: No    Review of Systems  Constitutional: Negative for fever and chills.  Respiratory: Negative for shortness of breath.   Cardiovascular: Positive for chest pain.  Gastrointestinal: Negative for nausea, vomiting, diarrhea and constipation.  Genitourinary: Negative for dysuria.  All other systems reviewed and are negative.     Allergies  Latex  Home Medications   Prior to Admission medications   Medication Sig Start Date End Date Taking? Authorizing Provider  ALPRAZolam Prudy Feeler(XANAX) 0.5 MG tablet Take 0.5 mg by mouth at bedtime as needed for anxiety.    Historical Provider, MD  aspirin 81 MG tablet Take 81 mg  by mouth daily.     Historical Provider, MD  carbamazepine (TEGRETOL XR) 200 MG 12 hr tablet Take 3 tablets (600 mg total) by mouth 2 (two) times daily. Brand is medically necessary. 10/25/13   Nilda Riggs, NP  docusate sodium (COLACE) 100 MG capsule Take 100 mg by mouth daily.     Historical Provider, MD  ezetimibe (ZETIA) 10 MG tablet Take 10 mg by mouth at bedtime.     Historical Provider, MD  Insulin Human (INSULIN PUMP) 100 unit/ml SOLN Inject into the skin continuous.     Historical Provider, MD  isosorbide mononitrate (IMDUR) 60 MG 24 hr tablet Take 60 mg by mouth every morning.     Historical  Provider, MD  Lancets Letta Pate ULTRASOFT) lancets  01/15/13   Historical Provider, MD  levETIRAcetam (KEPPRA) 500 MG tablet Take 250 mg every morning and 500 mg every evening. 10/25/13   Nilda Riggs, NP  lisinopril (PRINIVIL,ZESTRIL) 5 MG tablet Take 5 mg by mouth daily.    Historical Provider, MD  metoprolol tartrate (LOPRESSOR) 25 MG tablet Take 25 mg by mouth 2 (two) times daily.    Historical Provider, MD  metoprolol tartrate (LOPRESSOR) 25 MG tablet take 1 tablet by mouth twice a day    Tonny Bollman, MD  multivitamin (ONE-A-DAY MEN'S) TABS Take 1 tablet by mouth daily.     Historical Provider, MD  nitroGLYCERIN (NITROSTAT) 0.4 MG SL tablet Place 1 tablet (0.4 mg total) under the tongue every 5 (five) minutes as needed for chest pain. 06/06/13   Laurey Morale, MD  NOVOLOG 100 UNIT/ML injection Novolog Insulin Basal Rate as follows: 12 am .9 units, 6 am 1.9 units, 10 am 2.0 units, 12 pm 2.2 units, 6 pm 2.2 units 02/21/13   Historical Provider, MD  ONE TOUCH ULTRA TEST test strip  01/13/13   Historical Provider, MD  rosuvastatin (CRESTOR) 40 MG tablet Take 40 mg by mouth at bedtime.     Historical Provider, MD  sertraline (ZOLOFT) 100 MG tablet Take 100 mg by mouth at bedtime. One and half tablet at bedtime    Historical Provider, MD  vitamin B-12 (CYANOCOBALAMIN) 500 MCG tablet Take 500 mcg by mouth daily.    Historical Provider, MD   There were no vitals taken for this visit. Physical Exam  Nursing note and vitals reviewed. Constitutional: He is oriented to person, place, and time. He appears well-developed and well-nourished.  HENT:  Head: Normocephalic and atraumatic.  Eyes: Conjunctivae and EOM are normal. Pupils are equal, round, and reactive to light. Right eye exhibits no discharge. Left eye exhibits no discharge. No scleral icterus.  Neck: Normal range of motion. Neck supple. No JVD present.  Cardiovascular: Normal rate, regular rhythm and normal heart sounds.  Exam  reveals no gallop and no friction rub.   No murmur heard. Pulmonary/Chest: Effort normal and breath sounds normal. No respiratory distress. He has no wheezes. He has no rales. He exhibits no tenderness.  Abdominal: Soft. He exhibits no distension and no mass. There is no tenderness. There is no rebound and no guarding.  Musculoskeletal: Normal range of motion. He exhibits no edema and no tenderness.  Neurological: He is alert and oriented to person, place, and time.  Skin: Skin is warm and dry.  Psychiatric: He has a normal mood and affect. His behavior is normal. Judgment and thought content normal.    ED Course  Procedures (including critical care time) Results for orders placed during the  hospital encounter of 01/18/14  CBC      Result Value Ref Range   WBC 4.0  4.0 - 10.5 K/uL   RBC 4.69  4.22 - 5.81 MIL/uL   Hemoglobin 14.7  13.0 - 17.0 g/dL   HCT 73.5  67.0 - 14.1 %   MCV 90.4  78.0 - 100.0 fL   MCH 31.3  26.0 - 34.0 pg   MCHC 34.7  30.0 - 36.0 g/dL   RDW 03.0  13.1 - 43.8 %   Platelets 162  150 - 400 K/uL  BASIC METABOLIC PANEL      Result Value Ref Range   Sodium 141  137 - 147 mEq/L   Potassium 4.3  3.7 - 5.3 mEq/L   Chloride 101  96 - 112 mEq/L   CO2 26  19 - 32 mEq/L   Glucose, Bld 238 (*) 70 - 99 mg/dL   BUN 11  6 - 23 mg/dL   Creatinine, Ser 8.87  0.50 - 1.35 mg/dL   Calcium 8.9  8.4 - 57.9 mg/dL   GFR calc non Af Amer >90  >90 mL/min   GFR calc Af Amer >90  >90 mL/min   Anion gap 14  5 - 15   Dg Chest Port 1 View  01/18/2014   CLINICAL DATA:  Substernal chest pain.  EXAM: PORTABLE CHEST - 1 VIEW  COMPARISON:  02/25/2012  FINDINGS: Prior median sternotomy. Mild right hemidiaphragm elevation. Midline trachea. Cardiomegaly accentuated by AP portable technique. No pleural effusion or pneumothorax. Low lung volumes with resultant pulmonary interstitial prominence. No congestive failure. Clear lungs.  IMPRESSION: Mild cardiomegaly and low lung volumes, without acute  disease.   Electronically Signed   By: Jeronimo Greaves M.D.   On: 01/18/2014 15:15      EKG Interpretation   Date/Time:  Friday January 18 2014 14:48:29 EDT Ventricular Rate:  76 PR Interval:  174 QRS Duration: 98 QT Interval:  380 QTC Calculation: 427 R Axis:   100 Text Interpretation:  Normal sinus rhythm Incomplete right bundle branch  block Nonspecific ST abnormality No significant change since last tracing  Confirmed by STEINL  MD, Caryn Bee ([id number]) on 01/18/2014 3:39:23 PM      MDM   Final diagnoses:  Chest pain, unspecified chest pain type    Patient with known CAD, and exertional chest pain this morning. Check labs, EKG, give aspirin, will reassess.  4:27 PM Troponin is negative, although this not crossing over and affect.  Patient seen by and discussed with Dr. Denton Lank.  Plan is for delta troponin and EKG in 3 hours.  If negative, will discharge the patient to home.  7:41 PM Delta troponin is negative. Discharged home with primary care followup on Monday. Patient understands and agrees to plan. He is stable and ready for discharge.  Roxy Horseman, PA-C 01/18/14 1942

## 2014-01-18 NOTE — ED Notes (Signed)
Pt. Was having active chest pain, ECG done. Given to Dr. Meredith Mody and Rob, PA  No orders received

## 2014-01-18 NOTE — ED Provider Notes (Signed)
Medical screening examination/treatment/procedure(s) were conducted as a shared visit with non-physician practitioner(s) and myself.  I personally evaluated the patient during the encounter.   EKG Interpretation   Date/Time:  Friday January 18 2014 14:48:29 EDT Ventricular Rate:  76 PR Interval:  174 QRS Duration: 98 QT Interval:  380 QTC Calculation: 427 R Axis:   100 Text Interpretation:  Normal sinus rhythm Incomplete right bundle branch  block Nonspecific ST abnormality No significant change since last tracing  Confirmed by Akeiba Axelson  MD, Caryn Bee ([id number]) on 01/18/2014 3:39:23 PM      Pt c/o intermittent stabbing pain to chest, as if quick poke w end of sharpened pencil. Lasts seconds per episode. No unusual doe. No associated nv or diaphoresis. Unlike prior cardiac cp.  Chest cta. Will get delta trop.   Suzi Roots, MD 01/18/14 [phone]

## 2014-01-18 NOTE — Progress Notes (Signed)
Patient arrived to Cardiac rehab maintenance class at 1:20pm and shared that he had experienced intermittent chest discomfort this morning. He had been watching television when he experienced first onset today. He described the sensation as a pin-point pressure rating 2/10. He called Primary Care Physician and was cleared to exercise at cardiac rehab. He reported having experienced onset of chest discomfort with walk from the parking deck to the building with rating of 3/10. He shared the nature of the discomfort had changed from earlier today and that it was deeper and more intense "burning".  He said with sitting in the lobby the pressure decreased but did not get total relief. This Clinical research associate called and discussed matter with PCP. The patient then was evaluated for exercise. His arrival blood pressure 98/62 and heart rate 80.  However due to continued intermittent chest discomfort during a two lap walk as well as while sitting, follow up with Ronie Spies PAC for [facility] occurred. Instructions received to send patient to ED for further evaluation.  Patient was taken to ED via wheelchair without incident and handed off to Triage staff/RN. Patient's wife was notified and upon her arrival to Cardiac rehab department escorted by this writer to the ED.

## 2014-01-18 NOTE — ED Notes (Signed)
Discharge instructions reviewed with pt. Pt verbalized understanding.   

## 2014-01-18 NOTE — Discharge Instructions (Signed)

## 2014-01-18 NOTE — ED Notes (Signed)
Pt states that he had chest pain this morning 2/10 intermittent chest pain. Pt was told by PCP to go to cardiac rehab. Pt states that while walking from car to rehab chest pain worsened. Pt denies associated symptoms. NAD noted

## 2014-01-21 ENCOUNTER — Encounter (HOSPITAL_COMMUNITY): Admission: RE | Admit: 2014-01-21 | Payer: Self-pay | Source: Ambulatory Visit

## 2014-01-21 ENCOUNTER — Telehealth: Payer: Self-pay | Admitting: Cardiovascular Disease

## 2014-01-21 LAB — CBG MONITORING, ED: GLUCOSE-CAPILLARY: 185 mg/dL — AB (ref 70–99)

## 2014-01-21 NOTE — Telephone Encounter (Signed)
New message         Pt needs a clearance for Cardiac Rehab due to him having an episode while in Cardiac Rehab last Friday

## 2014-01-21 NOTE — Telephone Encounter (Signed)
Cardiac Rehab sent pt to the ER on 01/18/14 due to chest pain.  Pt was discharged home from the ER.  I will forward this message to Dr Excell Seltzer to review the pt's records and give approval to resume cardiac rehab if okay from a cardiac standpoint.

## 2014-01-22 NOTE — Telephone Encounter (Signed)
Reviewed nodes. Chest pain was highly atypical. Okay to return to cardiac rehabilitation.

## 2014-01-22 NOTE — Telephone Encounter (Signed)
Left message on pt's voicemail to let him know that he can return to cardiac rehab.

## 2014-01-23 ENCOUNTER — Encounter (HOSPITAL_COMMUNITY)
Admission: RE | Admit: 2014-01-23 | Discharge: 2014-01-23 | Disposition: A | Discharge status: Home / Self Care | Payer: Self-pay | Source: Ambulatory Visit | Attending: Cardiovascular Disease | Admitting: Cardiovascular Disease

## 2014-01-25 ENCOUNTER — Encounter (HOSPITAL_COMMUNITY)
Admission: RE | Admit: 2014-01-25 | Discharge: 2014-01-25 | Disposition: A | Discharge status: Home / Self Care | Payer: Self-pay | Source: Ambulatory Visit | Attending: Cardiovascular Disease | Admitting: Cardiovascular Disease

## 2014-01-28 ENCOUNTER — Encounter (HOSPITAL_COMMUNITY)
Admission: RE | Admit: 2014-01-28 | Discharge: 2014-01-28 | Disposition: A | Discharge status: Home / Self Care | Payer: Self-pay | Source: Ambulatory Visit | Attending: Cardiovascular Disease | Admitting: Cardiovascular Disease

## 2014-01-28 DIAGNOSIS — I25810 Atherosclerosis of coronary artery bypass graft(s) without angina pectoris: Secondary | ICD-10-CM | POA: Insufficient documentation

## 2014-01-28 DIAGNOSIS — E785 Hyperlipidemia, unspecified: Secondary | ICD-10-CM | POA: Insufficient documentation

## 2014-01-28 DIAGNOSIS — I214 Non-ST elevation (NSTEMI) myocardial infarction: Secondary | ICD-10-CM | POA: Insufficient documentation

## 2014-01-28 DIAGNOSIS — Z5189 Encounter for other specified aftercare: Secondary | ICD-10-CM | POA: Insufficient documentation

## 2014-01-30 ENCOUNTER — Encounter (HOSPITAL_COMMUNITY)
Admission: RE | Admit: 2014-01-30 | Discharge: 2014-01-30 | Disposition: A | Discharge status: Home / Self Care | Payer: Self-pay | Source: Ambulatory Visit | Attending: Cardiovascular Disease | Admitting: Cardiovascular Disease

## 2014-02-01 ENCOUNTER — Encounter (HOSPITAL_COMMUNITY)
Admission: RE | Admit: 2014-02-01 | Discharge: 2014-02-01 | Disposition: A | Discharge status: Home / Self Care | Payer: Self-pay | Source: Ambulatory Visit | Attending: Cardiovascular Disease | Admitting: Cardiovascular Disease

## 2014-02-04 ENCOUNTER — Encounter (HOSPITAL_COMMUNITY): Payer: Self-pay

## 2014-02-06 ENCOUNTER — Encounter (HOSPITAL_COMMUNITY)
Admission: RE | Admit: 2014-02-06 | Discharge: 2014-02-06 | Disposition: A | Discharge status: Home / Self Care | Payer: Self-pay | Source: Ambulatory Visit | Attending: Cardiovascular Disease | Admitting: Cardiovascular Disease

## 2014-02-08 ENCOUNTER — Telehealth: Payer: Self-pay | Admitting: Cardiovascular Disease

## 2014-02-08 ENCOUNTER — Encounter (HOSPITAL_COMMUNITY): Payer: Self-pay

## 2014-02-08 NOTE — Telephone Encounter (Signed)
New message     Pt want to know if he can take advil.

## 2014-02-08 NOTE — Telephone Encounter (Signed)
I spoke with the pt and he has been having off and on angina and nausea.  The pt also complained of a constant dull left arm pain. The pt said he had an episode last night and did contact Dr Waynard Edwards and he felt that the pt did not need to proceed to ER due to recent ER evaluation.  The pt took tums and this helped his symptoms. The pt also tried NTG last night but his arm pain did not resolve.  The pt woke up this morning with arm pain and would like to know if he can take advil.  I made the pt aware that he can take a short course of Advil and he will try this over the weekend. The pt will contact our office with any other questions or concerns.

## 2014-02-11 ENCOUNTER — Encounter (HOSPITAL_COMMUNITY)
Admission: RE | Admit: 2014-02-11 | Discharge: 2014-02-11 | Disposition: A | Discharge status: Home / Self Care | Payer: Self-pay | Source: Ambulatory Visit | Attending: Cardiovascular Disease | Admitting: Cardiovascular Disease

## 2014-02-13 ENCOUNTER — Encounter (HOSPITAL_COMMUNITY)
Admission: RE | Admit: 2014-02-13 | Discharge: 2014-02-13 | Disposition: A | Discharge status: Home / Self Care | Payer: Self-pay | Source: Ambulatory Visit | Attending: Cardiovascular Disease | Admitting: Cardiovascular Disease

## 2014-02-15 ENCOUNTER — Encounter (HOSPITAL_COMMUNITY)
Admission: RE | Admit: 2014-02-15 | Discharge: 2014-02-15 | Disposition: A | Discharge status: Home / Self Care | Payer: Self-pay | Source: Ambulatory Visit | Attending: Cardiovascular Disease | Admitting: Cardiovascular Disease

## 2014-02-18 ENCOUNTER — Encounter (HOSPITAL_COMMUNITY)
Admission: RE | Admit: 2014-02-18 | Discharge: 2014-02-18 | Disposition: A | Discharge status: Home / Self Care | Payer: Self-pay | Source: Ambulatory Visit | Attending: Cardiovascular Disease | Admitting: Cardiovascular Disease

## 2014-02-20 ENCOUNTER — Encounter (HOSPITAL_COMMUNITY): Payer: Self-pay

## 2014-02-22 ENCOUNTER — Encounter (HOSPITAL_COMMUNITY)
Admission: RE | Admit: 2014-02-22 | Discharge: 2014-02-22 | Disposition: A | Discharge status: Home / Self Care | Payer: Self-pay | Source: Ambulatory Visit | Attending: Cardiovascular Disease | Admitting: Cardiovascular Disease

## 2014-02-25 ENCOUNTER — Encounter (HOSPITAL_COMMUNITY): Payer: Self-pay

## 2014-02-27 ENCOUNTER — Encounter (HOSPITAL_COMMUNITY)
Admission: RE | Admit: 2014-02-27 | Discharge: 2014-02-27 | Disposition: A | Discharge status: Home / Self Care | Payer: Self-pay | Source: Ambulatory Visit | Attending: Cardiovascular Disease | Admitting: Cardiovascular Disease

## 2014-02-27 DIAGNOSIS — Z5189 Encounter for other specified aftercare: Secondary | ICD-10-CM | POA: Insufficient documentation

## 2014-02-27 DIAGNOSIS — I25810 Atherosclerosis of coronary artery bypass graft(s) without angina pectoris: Secondary | ICD-10-CM | POA: Insufficient documentation

## 2014-02-27 DIAGNOSIS — E785 Hyperlipidemia, unspecified: Secondary | ICD-10-CM | POA: Insufficient documentation

## 2014-02-27 DIAGNOSIS — I214 Non-ST elevation (NSTEMI) myocardial infarction: Secondary | ICD-10-CM | POA: Insufficient documentation

## 2014-03-01 ENCOUNTER — Encounter (HOSPITAL_COMMUNITY)
Admission: RE | Admit: 2014-03-01 | Discharge: 2014-03-01 | Disposition: A | Discharge status: Home / Self Care | Payer: Self-pay | Source: Ambulatory Visit | Attending: Cardiovascular Disease | Admitting: Cardiovascular Disease

## 2014-03-06 ENCOUNTER — Encounter (HOSPITAL_COMMUNITY)
Admission: RE | Admit: 2014-03-06 | Discharge: 2014-03-06 | Disposition: A | Discharge status: Home / Self Care | Payer: Self-pay | Source: Ambulatory Visit | Attending: Cardiovascular Disease | Admitting: Cardiovascular Disease

## 2014-03-08 ENCOUNTER — Encounter (HOSPITAL_COMMUNITY)
Admission: RE | Admit: 2014-03-08 | Discharge: 2014-03-08 | Disposition: A | Discharge status: Home / Self Care | Payer: Self-pay | Source: Ambulatory Visit | Attending: Cardiovascular Disease | Admitting: Cardiovascular Disease

## 2014-03-11 ENCOUNTER — Encounter (HOSPITAL_COMMUNITY)
Admission: RE | Admit: 2014-03-11 | Discharge: 2014-03-11 | Disposition: A | Discharge status: Home / Self Care | Payer: Self-pay | Source: Ambulatory Visit | Attending: Cardiovascular Disease | Admitting: Cardiovascular Disease

## 2014-03-13 ENCOUNTER — Encounter (HOSPITAL_COMMUNITY)
Admission: RE | Admit: 2014-03-13 | Discharge: 2014-03-13 | Disposition: A | Discharge status: Home / Self Care | Payer: Self-pay | Source: Ambulatory Visit | Attending: Cardiovascular Disease | Admitting: Cardiovascular Disease

## 2014-03-15 ENCOUNTER — Encounter (HOSPITAL_COMMUNITY)
Admission: RE | Admit: 2014-03-15 | Discharge: 2014-03-15 | Disposition: A | Discharge status: Home / Self Care | Payer: Self-pay | Source: Ambulatory Visit | Attending: Cardiovascular Disease | Admitting: Cardiovascular Disease

## 2014-03-18 ENCOUNTER — Encounter (HOSPITAL_COMMUNITY)
Admission: RE | Admit: 2014-03-18 | Discharge: 2014-03-18 | Disposition: A | Discharge status: Home / Self Care | Payer: Self-pay | Source: Ambulatory Visit | Attending: Cardiovascular Disease | Admitting: Cardiovascular Disease

## 2014-03-20 ENCOUNTER — Encounter (HOSPITAL_COMMUNITY)
Admission: RE | Admit: 2014-03-20 | Discharge: 2014-03-20 | Disposition: A | Discharge status: Home / Self Care | Payer: Self-pay | Source: Ambulatory Visit | Attending: Cardiovascular Disease | Admitting: Cardiovascular Disease

## 2014-03-22 ENCOUNTER — Encounter (HOSPITAL_COMMUNITY)
Admission: RE | Admit: 2014-03-22 | Discharge: 2014-03-22 | Disposition: A | Discharge status: Home / Self Care | Payer: Self-pay | Source: Ambulatory Visit | Attending: Cardiovascular Disease | Admitting: Cardiovascular Disease

## 2014-03-25 ENCOUNTER — Encounter (HOSPITAL_COMMUNITY): Payer: Self-pay

## 2014-03-27 ENCOUNTER — Encounter (HOSPITAL_COMMUNITY): Payer: Self-pay

## 2014-03-29 ENCOUNTER — Encounter (HOSPITAL_COMMUNITY)
Admission: RE | Admit: 2014-03-29 | Discharge: 2014-03-29 | Disposition: A | Discharge status: Home / Self Care | Payer: Self-pay | Source: Ambulatory Visit | Attending: Cardiovascular Disease | Admitting: Cardiovascular Disease

## 2014-03-29 DIAGNOSIS — I2510 Atherosclerotic heart disease of native coronary artery without angina pectoris: Secondary | ICD-10-CM | POA: Insufficient documentation

## 2014-03-29 DIAGNOSIS — I252 Old myocardial infarction: Secondary | ICD-10-CM | POA: Insufficient documentation

## 2014-03-29 DIAGNOSIS — Z5189 Encounter for other specified aftercare: Secondary | ICD-10-CM | POA: Insufficient documentation

## 2014-03-29 DIAGNOSIS — E785 Hyperlipidemia, unspecified: Secondary | ICD-10-CM | POA: Insufficient documentation

## 2014-03-31 ENCOUNTER — Ambulatory Visit (INDEPENDENT_AMBULATORY_CARE_PROVIDER_SITE_OTHER): Payer: Medicare Other | Admitting: Internal Medicine

## 2014-03-31 VITALS — BP 110/70 | HR 73 | Temp 97.8°F | Resp 16 | Ht 71.0 in | Wt 234.0 lb

## 2014-03-31 DIAGNOSIS — Q8501 Neurofibromatosis, type 1: Secondary | ICD-10-CM

## 2014-03-31 DIAGNOSIS — I10 Essential (primary) hypertension: Secondary | ICD-10-CM

## 2014-03-31 DIAGNOSIS — E1139 Type 2 diabetes mellitus with other diabetic ophthalmic complication: Secondary | ICD-10-CM

## 2014-03-31 DIAGNOSIS — J0101 Acute recurrent maxillary sinusitis: Secondary | ICD-10-CM

## 2014-03-31 MED ORDER — AMOXICILLIN 500 MG PO CAPS: 1000.0000 mg | ORAL_CAPSULE | Freq: Two times a day (BID) | ORAL | Status: AC

## 2014-03-31 NOTE — Progress Notes (Signed)
Subjective:    Patient ID: Jeff [name] N Mccathern, male    DOB: 07/16/1965, 48 y.o.   MRN: [id number] This chart was scribed for [name], MD by Chestine SporeSoijett Blue, ED Scribe. The patient was seen in room 14 at 2:55 PM.   Chief Complaint  Patient presents with  . Headache  . Sinus Problem    HPI Jeff GrumblingRichard N Alonso is a 48 y.o. 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 He rates his pain as a 8/10. He states that he had a flu shot approx. 2 weeks ago. He states that after that he had a noticeable change in health 2 nights ago with congestion, purulent discharge, sl cough 8 days ago. His PCP PERINI,MARK A, MD called in Z-pack and advised Flonase and he improved over next 5-6 days until his sxt returned yesterday. When he first gets up, he has to blow his nose and a lot of stuff came out.  He states that when he chews, he has pain on his left side of his face- having associated symptoms of rhinorrhea and non-productive cough. He denies fever, visual disturbance, ear pain.   He states that his last A1C was 7.2 and his DM is controlled w/ pump. He states that his next A1C is 10/18. He states that his blood sugar today was 102 fasting. He states that he has had Retinopathy and retina detaching in the past.  He states that he is on 2 L of O2 at night because he is dropping into low O2 saturation. He states that he has done several sleep studies and he has had a CPAP.  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  Patient Active Problem List   Diagnosis Date Noted  . Localization-related (focal) (partial) epilepsy and epileptic syndromes with complex partial seizures, without mention of intractable epilepsy 04/24/2013  . Neurofibromatosis, type 1 (von Recklinghausen's disease) 04/24/2013  . Pain in limb 04/24/2013  . Encounter for therapeutic drug monitoring 04/24/2013  . Postop check  12/08/2011  . Leg pain, left 12/17/2010  . HYPERLIPIDEMIA-MIXED 11/28/2009  . ACUT MI SUBENDOCARDIAL INFARCT SUBSQT EPIS CARE 11/28/2009  . CORONARY ATHEROSLERO AUTOL VEIN BYPASS GRAFT 11/28/2009   Past Medical History  Diagnosis Date  . CAD (coronary artery disease)     Cath 2011.  LAD stent patent, distal LAD occlusion, D1 100%, OM branch 80 - 90%, SVG to diag patent, LIMA to LAD occluded, SVG to RCA occluded, SVG to OM was occluded.   No change from previous cath.  Managed medically.   . Diabetes mellitus type I     on insulin pump at  home  . Sleep apnea     s/p oral surgery  . Hyperlipidemia   . 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 Diabetic retinopathy     s/p vitrectomy and history of retinal surgery  . HTN (hypertension)   . Seizure disorder   . Seasonal allergic rhinitis   . Depression   . Cataract   . Hemorrhoid   . Myocardial infarction   . Anxiety    Past Surgical History  Procedure Laterality Date  . Coronary artery bypass graft      4 vessel 4/11  . Uvulopalatoplasty surgery    . Tonsillectomy    . Release of right transverse carpal ligament    .  Right eye vitrectomy and detached retina repair  10/2007  . Laparoscopic appendectomy  11/24/2011  . Appendectomy     Allergies  Allergen Reactions  . Latex Rash   Prior to Admission medications   Medication Sig Start Date End Date Taking? Authorizing Provider  ALPRAZolam Prudy Feeler) 0.5 MG tablet Take 0.5 mg by mouth at bedtime as needed for anxiety.   Yes Historical Provider, MD  aspirin 81 MG tablet Take 81 mg by mouth daily.    Yes Historical Provider, MD  carbamazepine (TEGRETOL XR) 200 MG 12 hr tablet Take 3 tablets (600 mg total) by mouth 2 (two) times daily. Brand is medically necessary. 10/25/13  Yes Nilda Riggs, NP  docusate sodium (COLACE) 100 MG capsule Take 100 mg by mouth daily.    Yes Historical Provider, MD  ezetimibe (ZETIA) 10 MG tablet Take 10 mg by mouth  at bedtime.    Yes Historical Provider, MD  Insulin Human (INSULIN PUMP) 100 unit/ml SOLN Inject into the skin 3 (three) times daily before meals. Add 8-11 units per insulin pump 3 times daily before meals per bolus scale.   Yes Historical Provider, MD  isosorbide mononitrate (IMDUR) 60 MG 24 hr tablet Take 60 mg by mouth every morning.    Yes Historical Provider, MD  Lancets [facility] ULTRASOFT) lancets 1 each by Other route See admin instructions. Check blood sugar 4 times daily. Check blood sugar 6-7 times daily on days going to cardiac rehab. 01/15/13  Yes Historical Provider, MD  levETIRAcetam (KEPPRA) 500 MG tablet Take 250 mg every morning and 500 mg every evening. 10/25/13  Yes Nilda Riggs, NP  lisinopril (PRINIVIL,ZESTRIL) 5 MG tablet Take 5 mg by mouth daily.   Yes Historical Provider, MD  metoprolol tartrate (LOPRESSOR) 25 MG tablet Take 25 mg by mouth 2 (two) times daily.   Yes Historical Provider, MD  multivitamin (ONE-A-DAY MEN'S) TABS Take 1 tablet by mouth daily.    Yes Historical Provider, MD  nitroGLYCERIN (NITROSTAT) 0.4 MG SL tablet Place 1 tablet (0.4 mg total) under the tongue every 5 (five) minutes as needed for chest pain. 06/06/13  Yes Laurey Morale, MD  NOVOLOG 100 UNIT/ML injection Novolog Insulin Basal Rate as follows: 12 am .9 units, 6 am 1.9 units, 10 am 2.0 units, 12 pm 2.2 units, 6 pm 2.2 units 02/21/13  Yes Historical Provider, MD  ONE TOUCH ULTRA TEST test strip 1 each by Other route See admin instructions. Check blood sugar 4 times daily. Check blood sugar 6-7 times daily when going to cardiac rehab. 01/13/13  Yes Historical Provider, MD  rosuvastatin (CRESTOR) 40 MG tablet Take 40 mg by mouth at bedtime.    Yes Historical Provider, MD  sertraline (ZOLOFT) 100 MG tablet Take 100 mg by mouth at bedtime.    Yes Historical Provider, MD  vitamin B-12 (CYANOCOBALAMIN) 500 MCG tablet Take 500 mcg by mouth daily.   Yes Historical Provider, MD      Review of  Systems  Constitutional: Negative for fever and chills.  HENT: Positive for congestion and rhinorrhea.   Eyes: Negative for visual disturbance.  Respiratory: Positive for cough. Negative for shortness of breath and wheezing.   Cardiovascular: Negative for chest pain and palpitations.  Neurological: Positive for headaches. Negative for dizziness, tremors, speech difficulty, weakness, light-headedness and numbness.  Psychiatric/Behavioral: Negative for confusion.       Objective:   Physical Exam  Nursing note and vitals reviewed. Constitutional: He is oriented to person,  place, and time. He appears well-developed and well-nourished. No distress.  uncomfortable  HENT:  Head: Atraumatic.  Right Ear: External ear normal.  Left Ear: External ear normal.  Mouth/Throat: Oropharynx is clear and moist.  fibromas on forehead with prominence of the left orbital area in comparison to the right. Purulent discharge in the left nares. Tender maxillary and frontal area on the left to percussion.   Eyes: Conjunctivae and EOM are normal. Pupils are equal, round, and reactive to light. Right eye exhibits no discharge. Left eye exhibits no discharge.  Facial asymmetry as noted  Neck: Normal range of motion. Neck supple. No thyromegaly present.  Cardiovascular: Normal rate, regular rhythm and normal heart sounds.   Pulmonary/Chest: Effort normal and breath sounds normal. No respiratory distress. He has no wheezes. He has no rales.  Musculoskeletal: Normal range of motion. He exhibits no edema.  Lymphadenopathy:    He has no cervical adenopathy.  Neurological: He is alert and oriented to person, place, and time. No cranial nerve deficit. Coordination normal.  Skin: Skin is warm and dry.  Psychiatric: He has a normal mood and affect. His behavior is normal.       BP 138/64  Pulse 73  Temp(Src) 97.8 F (36.6 C) (Oral)  Resp 16  Ht 5\' 11"  (1.803 m)  Wt 234 lb (106.142 kg)  BMI 32.65 kg/m2  SpO2  96%   Assessment & Plan:  COORDINATION OF CARE: 3:05 PM-Discussed treatment plan which includes Amoxicillin with pt at bedside and pt agreed to plan.   I personally performed the services described in this documentation, which was scribed in my presence. The recorded information has been reviewed and is accurate.   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 amoxicillin (AMOXIL) 500 MG capsule    Sig: Take 2 capsules (1,000 mg total) by mouth 2 (two) times daily.    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

## 2014-04-01 ENCOUNTER — Encounter (HOSPITAL_COMMUNITY): Payer: Self-pay

## 2014-04-03 ENCOUNTER — Encounter (HOSPITAL_COMMUNITY)
Admission: RE | Admit: 2014-04-03 | Discharge: 2014-04-03 | Disposition: A | Discharge status: Home / Self Care | Payer: Self-pay | Source: Ambulatory Visit | Attending: Cardiovascular Disease | Admitting: Cardiovascular Disease

## 2014-04-05 ENCOUNTER — Encounter (HOSPITAL_COMMUNITY)
Admission: RE | Admit: 2014-04-05 | Discharge: 2014-04-05 | Disposition: A | Discharge status: Home / Self Care | Payer: Self-pay | Source: Ambulatory Visit | Attending: Cardiovascular Disease | Admitting: Cardiovascular Disease

## 2014-04-08 ENCOUNTER — Encounter (HOSPITAL_COMMUNITY)
Admission: RE | Admit: 2014-04-08 | Discharge: 2014-04-08 | Disposition: A | Discharge status: Home / Self Care | Payer: Self-pay | Source: Ambulatory Visit | Attending: Cardiovascular Disease | Admitting: Cardiovascular Disease

## 2014-04-10 ENCOUNTER — Encounter (HOSPITAL_COMMUNITY)
Admission: RE | Admit: 2014-04-10 | Discharge: 2014-04-10 | Disposition: A | Discharge status: Home / Self Care | Payer: Self-pay | Source: Ambulatory Visit | Attending: Cardiovascular Disease | Admitting: Cardiovascular Disease

## 2014-04-12 ENCOUNTER — Encounter (HOSPITAL_COMMUNITY)
Admission: RE | Admit: 2014-04-12 | Discharge: 2014-04-12 | Disposition: A | Discharge status: Home / Self Care | Payer: Self-pay | Source: Ambulatory Visit | Attending: Cardiovascular Disease | Admitting: Cardiovascular Disease

## 2014-04-15 ENCOUNTER — Encounter (HOSPITAL_COMMUNITY)
Admission: RE | Admit: 2014-04-15 | Discharge: 2014-04-15 | Disposition: A | Discharge status: Home / Self Care | Payer: Self-pay | Source: Ambulatory Visit | Attending: Cardiovascular Disease | Admitting: Cardiovascular Disease

## 2014-04-17 ENCOUNTER — Encounter (HOSPITAL_COMMUNITY)
Admission: RE | Admit: 2014-04-17 | Discharge: 2014-04-17 | Disposition: A | Discharge status: Home / Self Care | Payer: Self-pay | Source: Ambulatory Visit | Attending: Cardiovascular Disease | Admitting: Cardiovascular Disease

## 2014-04-19 ENCOUNTER — Encounter (HOSPITAL_COMMUNITY)
Admission: RE | Admit: 2014-04-19 | Discharge: 2014-04-19 | Disposition: A | Discharge status: Home / Self Care | Payer: Self-pay | Source: Ambulatory Visit | Attending: Cardiovascular Disease | Admitting: Cardiovascular Disease

## 2014-04-22 ENCOUNTER — Encounter (HOSPITAL_COMMUNITY)
Admission: RE | Admit: 2014-04-22 | Discharge: 2014-04-22 | Disposition: A | Discharge status: Home / Self Care | Payer: Self-pay | Source: Ambulatory Visit | Attending: Cardiovascular Disease | Admitting: Cardiovascular Disease

## 2014-04-24 ENCOUNTER — Encounter (HOSPITAL_COMMUNITY)
Admission: RE | Admit: 2014-04-24 | Discharge: 2014-04-24 | Disposition: A | Discharge status: Home / Self Care | Payer: Self-pay | Source: Ambulatory Visit | Attending: Cardiovascular Disease | Admitting: Cardiovascular Disease

## 2014-04-26 ENCOUNTER — Encounter (HOSPITAL_COMMUNITY)
Admission: RE | Admit: 2014-04-26 | Discharge: 2014-04-26 | Disposition: A | Discharge status: Home / Self Care | Payer: Self-pay | Source: Ambulatory Visit | Attending: Cardiovascular Disease | Admitting: Cardiovascular Disease

## 2014-04-29 ENCOUNTER — Encounter (HOSPITAL_COMMUNITY)
Admission: RE | Admit: 2014-04-29 | Discharge: 2014-04-29 | Disposition: A | Discharge status: Home / Self Care | Payer: Self-pay | Source: Ambulatory Visit | Attending: Cardiovascular Disease | Admitting: Cardiovascular Disease

## 2014-04-29 DIAGNOSIS — Z5189 Encounter for other specified aftercare: Secondary | ICD-10-CM | POA: Insufficient documentation

## 2014-04-29 DIAGNOSIS — E785 Hyperlipidemia, unspecified: Secondary | ICD-10-CM | POA: Insufficient documentation

## 2014-04-29 DIAGNOSIS — I2510 Atherosclerotic heart disease of native coronary artery without angina pectoris: Secondary | ICD-10-CM | POA: Insufficient documentation

## 2014-04-29 DIAGNOSIS — I252 Old myocardial infarction: Secondary | ICD-10-CM | POA: Insufficient documentation

## 2014-05-01 ENCOUNTER — Encounter (HOSPITAL_COMMUNITY): Payer: Self-pay

## 2014-05-03 ENCOUNTER — Encounter (HOSPITAL_COMMUNITY)
Admission: RE | Admit: 2014-05-03 | Discharge: 2014-05-03 | Disposition: A | Discharge status: Home / Self Care | Payer: Self-pay | Source: Ambulatory Visit | Attending: Cardiovascular Disease | Admitting: Cardiovascular Disease

## 2014-05-06 ENCOUNTER — Encounter (HOSPITAL_COMMUNITY)
Admission: RE | Admit: 2014-05-06 | Discharge: 2014-05-06 | Disposition: A | Discharge status: Home / Self Care | Payer: Self-pay | Source: Ambulatory Visit | Attending: Cardiovascular Disease | Admitting: Cardiovascular Disease

## 2014-05-08 ENCOUNTER — Encounter (HOSPITAL_COMMUNITY)
Admission: RE | Admit: 2014-05-08 | Discharge: 2014-05-08 | Disposition: A | Discharge status: Home / Self Care | Payer: Self-pay | Source: Ambulatory Visit | Attending: Cardiovascular Disease | Admitting: Cardiovascular Disease

## 2014-05-10 ENCOUNTER — Encounter (HOSPITAL_COMMUNITY)
Admission: RE | Admit: 2014-05-10 | Discharge: 2014-05-10 | Disposition: A | Discharge status: Home / Self Care | Payer: Self-pay | Source: Ambulatory Visit | Attending: Cardiovascular Disease | Admitting: Cardiovascular Disease

## 2014-05-13 ENCOUNTER — Encounter (HOSPITAL_COMMUNITY)
Admission: RE | Admit: 2014-05-13 | Discharge: 2014-05-13 | Disposition: A | Discharge status: Home / Self Care | Payer: Self-pay | Source: Ambulatory Visit | Attending: Cardiovascular Disease | Admitting: Cardiovascular Disease

## 2014-05-13 ENCOUNTER — Ambulatory Visit (INDEPENDENT_AMBULATORY_CARE_PROVIDER_SITE_OTHER): Payer: Medicare Other | Admitting: Cardiovascular Disease

## 2014-05-13 ENCOUNTER — Encounter: Payer: Self-pay | Admitting: Cardiovascular Disease

## 2014-05-13 VITALS — BP 90/52 | HR 77 | Ht 71.0 in | Wt 231.8 lb

## 2014-05-13 DIAGNOSIS — I25118 Atherosclerotic heart disease of native coronary artery with other forms of angina pectoris: Secondary | ICD-10-CM

## 2014-05-13 NOTE — Progress Notes (Signed)
Background: The patient is followed for coronary artery disease with history of CABG in 2011 and early bypass graft failure. He was treated with multivessel PCI following CABG (Native LAD and SVG-diagonal graft were stented, all other grafts occluded). He presented in August 2014 with worsening chest discomfort and underwent cardiac catheterization. This showed continued patency of his stent sites and continued patency of his diagonal vein graft. There was a pattern of diffuse small vessel disease noted and medical therapy was continued. LV function has been normal.  HPI:  48 year-old gentleman presenting for follow-up evaluation. Continues with maintenance phase of cardiac rehab 3 days per week. Denies any anginal symptoms with exercise. He's going to try to start getting some exercise on off days. He complains of problems with sleep. Also with daytime somnolence. Specifically denies chest pain or dyspnea. No edema or palpitations.  Outpatient Encounter Prescriptions as of 05/13/2014  Medication Sig  . ALPRAZolam (XANAX) 0.5 MG tablet Take 0.5 mg by mouth at bedtime as needed for anxiety.  [name]. aspirin 81 MG tablet Take 81 mg by mouth daily.   . carbamazepine (TEGRETOL XR) 200 MG 12 hr tablet Take 3 tablets (600 mg total) by mouth 2 (two) times daily. Brand is medically necessary.  . docusate sodium (COLACE) 100 MG capsule Take 100 mg by mouth daily.   [name]. ezetimibe (ZETIA) 10 MG tablet Take 10 mg by mouth at bedtime.   . Insulin Human (INSULIN PUMP) 100 unit/ml SOLN Inject into the skin 3 (three) times daily before meals. Add 8-11 units per insulin pump 3 times daily before meals per bolus scale.  . isosorbide mononitrate (IMDUR) 60 MG 24 hr tablet Take 60 mg by mouth every morning.   . Lancets (ONETOUCH ULTRASOFT) lancets 1 each by Other route See admin instructions. Check blood sugar 4 times daily. Check blood sugar 6-7 times daily on days going to cardiac rehab.  . levETIRAcetam (KEPPRA) 500 MG  tablet Take 250 mg every morning and 500 mg every evening.  [name]. lisinopril (PRINIVIL,ZESTRIL) 5 MG tablet Take 5 mg by mouth daily.  . metoprolol tartrate (LOPRESSOR) 25 MG tablet Take 25 mg by mouth 2 (two) times daily.  . multivitamin (ONE-A-DAY MEN'S) TABS Take 1 tablet by mouth daily.   . nitroGLYCERIN (NITROSTAT) 0.4 MG SL tablet Place 1 tablet (0.4 mg total) under the tongue every 5 (five) minutes as needed for chest pain.  [name]. NOVOLOG 100 UNIT/ML injection Novolog Insulin Basal Rate as follows: 12 am .9 units, 6 am 1.9 units, 10 am 2.0 units, 12 pm 2.2 units, 6 pm 2.2 units  . ONE TOUCH ULTRA TEST test strip 1 each by Other route See admin instructions. Check blood sugar 4 times daily. Check blood sugar 6-7 times daily when going to cardiac rehab.  . rosuvastatin (CRESTOR) 40 MG tablet Take 40 mg by mouth at bedtime.   . sertraline (ZOLOFT) 100 MG tablet Take 100 mg by mouth at bedtime.   . vitamin B-12 (CYANOCOBALAMIN) 500 MCG tablet Take 500 mcg by mouth daily.    Allergies  Allergen Reactions  . Latex Rash    Past Medical History  Diagnosis Date  . CAD (coronary artery disease)     Cath 2011.  LAD stent patent, distal LAD occlusion, D1 100%, OM branch 80 - 90%, SVG to diag patent, LIMA to LAD occluded, SVG to RCA occluded, SVG to OM was occluded.   No change from previous cath.  Managed medically.   .[name]  Diabetes mellitus type I     on insulin pump at  home  . Sleep apnea     s/p oral surgery  . Hyperlipidemia   . 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 Diabetic retinopathy     s/p vitrectomy and history of retinal surgery  . HTN (hypertension)   . Seizure disorder   . Seasonal allergic rhinitis   . Depression   . Cataract   . Hemorrhoid   . Myocardial infarction   . Anxiety     family history includes Arthritis in his sister; Coronary artery disease (age of onset: 62) in his father; Diabetes in his father; Hypertension in his father;  Squamous cell carcinoma in his brother.   ROS: Negative except as per HPI  BP 90/52 mmHg  Pulse 77  Ht 5\' 11"  (1.803 m)  Wt 231 lb 12.8 oz (105.144 kg)  BMI 32.34 kg/m2  SpO2 93%  PHYSICAL EXAM: Pt is alert and oriented, NAD HEENT: normal Neck: JVP - normal, carotids 2+= without bruits Lungs: CTA bilaterally CV: RRR without murmur or gallop Abd: soft, NT, Positive BS, no hepatomegaly Ext: no C/C/E, distal pulses intact and equal Skin: warm/dry no rash  ASSESSMENT AND PLAN: 1. CAD, native vessel and bypass graft disease, with chronic exertional angina, CCS Class 2. Angina controlled on current Rx (beta-blocker, imdur).  2. HTN, essential: hypotensive on current medical therapy. No dizziness or presyncope. We discussed consideration of reducing lisinopril to 2.5 mg but he did not want to make this change (states BP at cardiac rehab is within normal limits).  3. Hyperlipidemia: on rosuvastatin and zetia. Followed by Dr Waynard Edwards.  4. Fatigue: suspect multifactorial (poor sleep, anti-anginal Rx, anticonvulsants). Balance between anginal control and side-effects of medications. Also notes that he has obstructive sleep apnea but unable to tolerate CPAP. He does wear nocturnal O2.  Follow-up: 6 months.  Tonny Bollman, MD 05/13/2014 9:11 AM

## 2014-05-13 NOTE — Patient Instructions (Signed)
Your physician wants you to follow-up in: 6 MONTHS with Dr Cooper.  You will receive a reminder letter in the mail two months in advance. If you don't receive a letter, please call our office to schedule the follow-up appointment.  Your physician recommends that you continue on your current medications as directed. Please refer to the Current Medication list given to you today.  

## 2014-05-15 ENCOUNTER — Encounter (HOSPITAL_COMMUNITY)
Admission: RE | Admit: 2014-05-15 | Discharge: 2014-05-15 | Disposition: A | Discharge status: Home / Self Care | Payer: Self-pay | Source: Ambulatory Visit | Attending: Cardiovascular Disease | Admitting: Cardiovascular Disease

## 2014-05-17 ENCOUNTER — Encounter (HOSPITAL_COMMUNITY)
Admission: RE | Admit: 2014-05-17 | Discharge: 2014-05-17 | Disposition: A | Discharge status: Home / Self Care | Payer: Self-pay | Source: Ambulatory Visit | Attending: Cardiovascular Disease | Admitting: Cardiovascular Disease

## 2014-05-20 ENCOUNTER — Encounter (HOSPITAL_COMMUNITY)
Admission: RE | Admit: 2014-05-20 | Discharge: 2014-05-20 | Disposition: A | Discharge status: Home / Self Care | Payer: Self-pay | Source: Ambulatory Visit | Attending: Cardiovascular Disease | Admitting: Cardiovascular Disease

## 2014-05-22 ENCOUNTER — Encounter (HOSPITAL_COMMUNITY)
Admission: RE | Admit: 2014-05-22 | Discharge: 2014-05-22 | Disposition: A | Discharge status: Home / Self Care | Payer: Self-pay | Source: Ambulatory Visit | Attending: Cardiovascular Disease | Admitting: Cardiovascular Disease

## 2014-05-27 ENCOUNTER — Encounter (HOSPITAL_COMMUNITY)
Admission: RE | Admit: 2014-05-27 | Discharge: 2014-05-27 | Disposition: A | Discharge status: Home / Self Care | Payer: Self-pay | Source: Ambulatory Visit | Attending: Cardiovascular Disease | Admitting: Cardiovascular Disease

## 2014-05-27 ENCOUNTER — Other Ambulatory Visit: Payer: Self-pay | Admitting: Cardiovascular Disease

## 2014-05-29 ENCOUNTER — Encounter (HOSPITAL_COMMUNITY)
Admission: RE | Admit: 2014-05-29 | Discharge: 2014-05-29 | Disposition: A | Discharge status: Home / Self Care | Payer: Self-pay | Source: Ambulatory Visit | Attending: Cardiovascular Disease | Admitting: Cardiovascular Disease

## 2014-05-29 DIAGNOSIS — I252 Old myocardial infarction: Secondary | ICD-10-CM | POA: Insufficient documentation

## 2014-05-29 DIAGNOSIS — Z5189 Encounter for other specified aftercare: Secondary | ICD-10-CM | POA: Insufficient documentation

## 2014-05-29 DIAGNOSIS — I2510 Atherosclerotic heart disease of native coronary artery without angina pectoris: Secondary | ICD-10-CM | POA: Insufficient documentation

## 2014-05-29 DIAGNOSIS — E785 Hyperlipidemia, unspecified: Secondary | ICD-10-CM | POA: Insufficient documentation

## 2014-05-31 ENCOUNTER — Encounter (HOSPITAL_COMMUNITY): Payer: Self-pay

## 2014-06-03 ENCOUNTER — Encounter (HOSPITAL_COMMUNITY)
Admission: RE | Admit: 2014-06-03 | Discharge: 2014-06-03 | Disposition: A | Discharge status: Home / Self Care | Payer: Self-pay | Source: Ambulatory Visit | Attending: Cardiovascular Disease | Admitting: Cardiovascular Disease

## 2014-06-05 ENCOUNTER — Encounter (HOSPITAL_COMMUNITY)
Admission: RE | Admit: 2014-06-05 | Discharge: 2014-06-05 | Disposition: A | Discharge status: Home / Self Care | Payer: Self-pay | Source: Ambulatory Visit | Attending: Cardiovascular Disease | Admitting: Cardiovascular Disease

## 2014-06-06 ENCOUNTER — Encounter (HOSPITAL_COMMUNITY): Payer: Self-pay | Admitting: Cardiovascular Disease

## 2014-06-07 ENCOUNTER — Encounter (HOSPITAL_COMMUNITY)
Admission: RE | Admit: 2014-06-07 | Discharge: 2014-06-07 | Disposition: A | Discharge status: Home / Self Care | Payer: Self-pay | Source: Ambulatory Visit | Attending: Cardiovascular Disease | Admitting: Cardiovascular Disease

## 2014-06-10 ENCOUNTER — Encounter (HOSPITAL_COMMUNITY)
Admission: RE | Admit: 2014-06-10 | Discharge: 2014-06-10 | Disposition: A | Discharge status: Home / Self Care | Payer: Self-pay | Source: Ambulatory Visit | Attending: Cardiovascular Disease | Admitting: Cardiovascular Disease

## 2014-06-12 ENCOUNTER — Encounter (HOSPITAL_COMMUNITY)
Admission: RE | Admit: 2014-06-12 | Discharge: 2014-06-12 | Disposition: A | Discharge status: Home / Self Care | Payer: Self-pay | Source: Ambulatory Visit | Attending: Cardiovascular Disease | Admitting: Cardiovascular Disease

## 2014-06-14 ENCOUNTER — Encounter (HOSPITAL_COMMUNITY)
Admission: RE | Admit: 2014-06-14 | Discharge: 2014-06-14 | Disposition: A | Discharge status: Home / Self Care | Payer: Self-pay | Source: Ambulatory Visit | Attending: Cardiovascular Disease | Admitting: Cardiovascular Disease

## 2014-06-17 ENCOUNTER — Encounter (HOSPITAL_COMMUNITY)
Admission: RE | Admit: 2014-06-17 | Discharge: 2014-06-17 | Disposition: A | Discharge status: Home / Self Care | Payer: Self-pay | Source: Ambulatory Visit | Attending: Cardiovascular Disease | Admitting: Cardiovascular Disease

## 2014-06-19 ENCOUNTER — Encounter (HOSPITAL_COMMUNITY)
Admission: RE | Admit: 2014-06-19 | Discharge: 2014-06-19 | Disposition: A | Discharge status: Home / Self Care | Payer: Self-pay | Source: Ambulatory Visit | Attending: Cardiovascular Disease | Admitting: Cardiovascular Disease

## 2014-06-24 ENCOUNTER — Encounter (HOSPITAL_COMMUNITY)
Admission: RE | Admit: 2014-06-24 | Discharge: 2014-06-24 | Disposition: A | Discharge status: Home / Self Care | Payer: Self-pay | Source: Ambulatory Visit | Attending: Cardiovascular Disease | Admitting: Cardiovascular Disease

## 2014-06-25 ENCOUNTER — Other Ambulatory Visit: Payer: Self-pay | Admitting: Cardiology

## 2014-06-26 ENCOUNTER — Encounter (HOSPITAL_COMMUNITY)
Admission: RE | Admit: 2014-06-26 | Discharge: 2014-06-26 | Disposition: A | Discharge status: Home / Self Care | Payer: Self-pay | Source: Ambulatory Visit | Attending: Cardiovascular Disease | Admitting: Cardiovascular Disease

## 2014-07-01 ENCOUNTER — Encounter (HOSPITAL_COMMUNITY)
Admission: RE | Admit: 2014-07-01 | Discharge: 2014-07-01 | Disposition: A | Discharge status: Home / Self Care | Payer: Self-pay | Source: Ambulatory Visit | Attending: Cardiovascular Disease | Admitting: Cardiovascular Disease

## 2014-07-01 DIAGNOSIS — I2510 Atherosclerotic heart disease of native coronary artery without angina pectoris: Secondary | ICD-10-CM | POA: Insufficient documentation

## 2014-07-01 DIAGNOSIS — Z5189 Encounter for other specified aftercare: Secondary | ICD-10-CM | POA: Insufficient documentation

## 2014-07-01 DIAGNOSIS — E785 Hyperlipidemia, unspecified: Secondary | ICD-10-CM | POA: Insufficient documentation

## 2014-07-01 DIAGNOSIS — I252 Old myocardial infarction: Secondary | ICD-10-CM | POA: Insufficient documentation

## 2014-07-03 ENCOUNTER — Encounter (HOSPITAL_COMMUNITY)
Admission: RE | Admit: 2014-07-03 | Discharge: 2014-07-03 | Disposition: A | Discharge status: Home / Self Care | Payer: Self-pay | Source: Ambulatory Visit | Attending: Cardiovascular Disease | Admitting: Cardiovascular Disease

## 2014-07-05 ENCOUNTER — Encounter (HOSPITAL_COMMUNITY)
Admission: RE | Admit: 2014-07-05 | Discharge: 2014-07-05 | Disposition: A | Discharge status: Home / Self Care | Payer: Self-pay | Source: Ambulatory Visit | Attending: Cardiovascular Disease | Admitting: Cardiovascular Disease

## 2014-07-08 ENCOUNTER — Encounter (HOSPITAL_COMMUNITY)
Admission: RE | Admit: 2014-07-08 | Discharge: 2014-07-08 | Disposition: A | Discharge status: Home / Self Care | Payer: Self-pay | Source: Ambulatory Visit | Attending: Cardiovascular Disease | Admitting: Cardiovascular Disease

## 2014-07-10 ENCOUNTER — Encounter (HOSPITAL_COMMUNITY)
Admission: RE | Admit: 2014-07-10 | Discharge: 2014-07-10 | Disposition: A | Discharge status: Home / Self Care | Payer: Self-pay | Source: Ambulatory Visit | Attending: Cardiovascular Disease | Admitting: Cardiovascular Disease

## 2014-07-12 ENCOUNTER — Encounter (HOSPITAL_COMMUNITY): Payer: Self-pay

## 2014-07-15 ENCOUNTER — Encounter (HOSPITAL_COMMUNITY): Payer: Self-pay

## 2014-07-17 ENCOUNTER — Encounter (HOSPITAL_COMMUNITY)
Admission: RE | Admit: 2014-07-17 | Discharge: 2014-07-17 | Disposition: A | Discharge status: Home / Self Care | Payer: Self-pay | Source: Ambulatory Visit | Attending: Cardiovascular Disease | Admitting: Cardiovascular Disease

## 2014-07-19 ENCOUNTER — Encounter (HOSPITAL_COMMUNITY): Payer: Self-pay

## 2014-07-22 ENCOUNTER — Encounter (HOSPITAL_COMMUNITY)
Admission: RE | Admit: 2014-07-22 | Discharge: 2014-07-22 | Disposition: A | Discharge status: Home / Self Care | Payer: Self-pay | Source: Ambulatory Visit | Attending: Cardiovascular Disease | Admitting: Cardiovascular Disease

## 2014-07-24 ENCOUNTER — Encounter (HOSPITAL_COMMUNITY)
Admission: RE | Admit: 2014-07-24 | Discharge: 2014-07-24 | Disposition: A | Discharge status: Home / Self Care | Payer: Self-pay | Source: Ambulatory Visit | Attending: Cardiovascular Disease | Admitting: Cardiovascular Disease

## 2014-07-26 ENCOUNTER — Encounter (HOSPITAL_COMMUNITY)
Admission: RE | Admit: 2014-07-26 | Discharge: 2014-07-26 | Disposition: A | Discharge status: Home / Self Care | Payer: Self-pay | Source: Ambulatory Visit | Attending: Cardiovascular Disease | Admitting: Cardiovascular Disease

## 2014-07-29 ENCOUNTER — Encounter (HOSPITAL_COMMUNITY)
Admission: RE | Admit: 2014-07-29 | Discharge: 2014-07-29 | Disposition: A | Discharge status: Home / Self Care | Payer: Self-pay | Source: Ambulatory Visit | Attending: Cardiovascular Disease | Admitting: Cardiovascular Disease

## 2014-07-29 DIAGNOSIS — I2510 Atherosclerotic heart disease of native coronary artery without angina pectoris: Secondary | ICD-10-CM | POA: Insufficient documentation

## 2014-07-29 DIAGNOSIS — I252 Old myocardial infarction: Secondary | ICD-10-CM | POA: Insufficient documentation

## 2014-07-29 DIAGNOSIS — E785 Hyperlipidemia, unspecified: Secondary | ICD-10-CM | POA: Insufficient documentation

## 2014-07-29 DIAGNOSIS — Z5189 Encounter for other specified aftercare: Secondary | ICD-10-CM | POA: Insufficient documentation

## 2014-07-31 ENCOUNTER — Encounter (HOSPITAL_COMMUNITY)
Admission: RE | Admit: 2014-07-31 | Discharge: 2014-07-31 | Disposition: A | Discharge status: Home / Self Care | Payer: Self-pay | Source: Ambulatory Visit | Attending: Cardiovascular Disease | Admitting: Cardiovascular Disease

## 2014-08-02 ENCOUNTER — Encounter (HOSPITAL_COMMUNITY)
Admission: RE | Admit: 2014-08-02 | Discharge: 2014-08-02 | Disposition: A | Discharge status: Home / Self Care | Payer: Self-pay | Source: Ambulatory Visit | Attending: Cardiovascular Disease | Admitting: Cardiovascular Disease

## 2014-08-05 ENCOUNTER — Encounter (HOSPITAL_COMMUNITY)
Admission: RE | Admit: 2014-08-05 | Discharge: 2014-08-05 | Disposition: A | Discharge status: Home / Self Care | Payer: Self-pay | Source: Ambulatory Visit | Attending: Cardiovascular Disease | Admitting: Cardiovascular Disease

## 2014-08-07 ENCOUNTER — Encounter (HOSPITAL_COMMUNITY)
Admission: RE | Admit: 2014-08-07 | Discharge: 2014-08-07 | Disposition: A | Discharge status: Home / Self Care | Payer: Self-pay | Source: Ambulatory Visit | Attending: Cardiovascular Disease | Admitting: Cardiovascular Disease

## 2014-08-09 ENCOUNTER — Encounter (HOSPITAL_COMMUNITY)
Admission: RE | Admit: 2014-08-09 | Discharge: 2014-08-09 | Disposition: A | Discharge status: Home / Self Care | Payer: Self-pay | Source: Ambulatory Visit | Attending: Cardiovascular Disease | Admitting: Cardiovascular Disease

## 2014-08-12 ENCOUNTER — Encounter (HOSPITAL_COMMUNITY): Payer: Self-pay

## 2014-08-14 ENCOUNTER — Encounter (HOSPITAL_COMMUNITY): Payer: Self-pay

## 2014-08-16 ENCOUNTER — Encounter (HOSPITAL_COMMUNITY)
Admission: RE | Admit: 2014-08-16 | Discharge: 2014-08-16 | Disposition: A | Discharge status: Home / Self Care | Payer: Self-pay | Source: Ambulatory Visit | Attending: Cardiovascular Disease | Admitting: Cardiovascular Disease

## 2014-08-19 ENCOUNTER — Encounter (HOSPITAL_COMMUNITY)
Admission: RE | Admit: 2014-08-19 | Discharge: 2014-08-19 | Disposition: A | Discharge status: Home / Self Care | Payer: Self-pay | Source: Ambulatory Visit | Attending: Cardiovascular Disease | Admitting: Cardiovascular Disease

## 2014-08-21 ENCOUNTER — Encounter (HOSPITAL_COMMUNITY)
Admission: RE | Admit: 2014-08-21 | Discharge: 2014-08-21 | Disposition: A | Discharge status: Home / Self Care | Payer: Self-pay | Source: Ambulatory Visit | Attending: Cardiovascular Disease | Admitting: Cardiovascular Disease

## 2014-08-23 ENCOUNTER — Encounter (HOSPITAL_COMMUNITY)
Admission: RE | Admit: 2014-08-23 | Discharge: 2014-08-23 | Disposition: A | Discharge status: Home / Self Care | Payer: Self-pay | Source: Ambulatory Visit | Attending: Cardiovascular Disease | Admitting: Cardiovascular Disease

## 2014-08-26 ENCOUNTER — Encounter (HOSPITAL_COMMUNITY)
Admission: RE | Admit: 2014-08-26 | Discharge: 2014-08-26 | Disposition: A | Discharge status: Home / Self Care | Payer: Self-pay | Source: Ambulatory Visit | Attending: Cardiovascular Disease | Admitting: Cardiovascular Disease

## 2014-08-28 ENCOUNTER — Encounter (HOSPITAL_COMMUNITY)
Admission: RE | Admit: 2014-08-28 | Discharge: 2014-08-28 | Disposition: A | Discharge status: Home / Self Care | Payer: Self-pay | Source: Ambulatory Visit | Attending: Cardiovascular Disease | Admitting: Cardiovascular Disease

## 2014-08-28 DIAGNOSIS — Z5189 Encounter for other specified aftercare: Secondary | ICD-10-CM | POA: Insufficient documentation

## 2014-08-28 DIAGNOSIS — I252 Old myocardial infarction: Secondary | ICD-10-CM | POA: Insufficient documentation

## 2014-08-28 DIAGNOSIS — I2510 Atherosclerotic heart disease of native coronary artery without angina pectoris: Secondary | ICD-10-CM | POA: Insufficient documentation

## 2014-08-28 DIAGNOSIS — E785 Hyperlipidemia, unspecified: Secondary | ICD-10-CM | POA: Insufficient documentation

## 2014-08-30 ENCOUNTER — Encounter (HOSPITAL_COMMUNITY)
Admission: RE | Admit: 2014-08-30 | Discharge: 2014-08-30 | Disposition: A | Discharge status: Home / Self Care | Payer: Self-pay | Source: Ambulatory Visit | Attending: Cardiovascular Disease | Admitting: Cardiovascular Disease

## 2014-09-02 ENCOUNTER — Encounter (HOSPITAL_COMMUNITY)
Admission: RE | Admit: 2014-09-02 | Discharge: 2014-09-02 | Disposition: A | Discharge status: Home / Self Care | Payer: Self-pay | Source: Ambulatory Visit | Attending: Cardiovascular Disease | Admitting: Cardiovascular Disease

## 2014-09-04 ENCOUNTER — Encounter (HOSPITAL_COMMUNITY)
Admission: RE | Admit: 2014-09-04 | Discharge: 2014-09-04 | Disposition: A | Discharge status: Home / Self Care | Payer: Self-pay | Source: Ambulatory Visit | Attending: Cardiovascular Disease | Admitting: Cardiovascular Disease

## 2014-09-06 ENCOUNTER — Encounter (HOSPITAL_COMMUNITY)
Admission: RE | Admit: 2014-09-06 | Discharge: 2014-09-06 | Disposition: A | Discharge status: Home / Self Care | Payer: Self-pay | Source: Ambulatory Visit | Attending: Cardiovascular Disease | Admitting: Cardiovascular Disease

## 2014-09-09 ENCOUNTER — Encounter (HOSPITAL_COMMUNITY)
Admission: RE | Admit: 2014-09-09 | Discharge: 2014-09-09 | Disposition: A | Discharge status: Home / Self Care | Payer: Self-pay | Source: Ambulatory Visit | Attending: Cardiovascular Disease | Admitting: Cardiovascular Disease

## 2014-09-11 ENCOUNTER — Encounter (HOSPITAL_COMMUNITY)
Admission: RE | Admit: 2014-09-11 | Discharge: 2014-09-11 | Disposition: A | Discharge status: Home / Self Care | Payer: Self-pay | Source: Ambulatory Visit | Attending: Cardiovascular Disease | Admitting: Cardiovascular Disease

## 2014-09-13 ENCOUNTER — Encounter (HOSPITAL_COMMUNITY)
Admission: RE | Admit: 2014-09-13 | Discharge: 2014-09-13 | Disposition: A | Discharge status: Home / Self Care | Payer: Self-pay | Source: Ambulatory Visit | Attending: Cardiovascular Disease | Admitting: Cardiovascular Disease

## 2014-09-16 ENCOUNTER — Encounter (HOSPITAL_COMMUNITY)
Admission: RE | Admit: 2014-09-16 | Discharge: 2014-09-16 | Disposition: A | Discharge status: Home / Self Care | Payer: Self-pay | Source: Ambulatory Visit | Attending: Cardiovascular Disease | Admitting: Cardiovascular Disease

## 2014-09-18 ENCOUNTER — Encounter (HOSPITAL_COMMUNITY)
Admission: RE | Admit: 2014-09-18 | Discharge: 2014-09-18 | Disposition: A | Discharge status: Home / Self Care | Payer: Self-pay | Source: Ambulatory Visit | Attending: Cardiovascular Disease | Admitting: Cardiovascular Disease

## 2014-09-20 ENCOUNTER — Encounter (HOSPITAL_COMMUNITY): Payer: Self-pay

## 2014-09-23 ENCOUNTER — Encounter (HOSPITAL_COMMUNITY)
Admission: RE | Admit: 2014-09-23 | Discharge: 2014-09-23 | Disposition: A | Discharge status: Home / Self Care | Payer: Self-pay | Source: Ambulatory Visit | Attending: Cardiovascular Disease | Admitting: Cardiovascular Disease

## 2014-09-23 NOTE — Progress Notes (Signed)
Jeff [name] was exercising at cardiac rehab today and complained of left shoulder pain while exercising with [name] today.  He felt the pain with movement while he was on the nustep and treadmill.  He was able to reproduce the pain with moving his shoulder in certain ways.  He spent the weekend at his in-law and is not sure if sleeping in a different bed triggered it.  He was cautioned to call 911 or call his cardiologist if his pain continues.  He finished exercising and left the department in no acute distress.

## 2014-09-25 ENCOUNTER — Encounter (HOSPITAL_COMMUNITY)
Admission: RE | Admit: 2014-09-25 | Discharge: 2014-09-25 | Disposition: A | Discharge status: Home / Self Care | Payer: Self-pay | Source: Ambulatory Visit | Attending: Cardiovascular Disease | Admitting: Cardiovascular Disease

## 2014-09-27 ENCOUNTER — Encounter (HOSPITAL_COMMUNITY)
Admission: RE | Admit: 2014-09-27 | Discharge: 2014-09-27 | Disposition: A | Discharge status: Home / Self Care | Payer: Self-pay | Source: Ambulatory Visit | Attending: Cardiovascular Disease | Admitting: Cardiovascular Disease

## 2014-09-27 DIAGNOSIS — I2510 Atherosclerotic heart disease of native coronary artery without angina pectoris: Secondary | ICD-10-CM | POA: Insufficient documentation

## 2014-09-27 DIAGNOSIS — Z5189 Encounter for other specified aftercare: Secondary | ICD-10-CM | POA: Insufficient documentation

## 2014-09-27 DIAGNOSIS — E785 Hyperlipidemia, unspecified: Secondary | ICD-10-CM | POA: Insufficient documentation

## 2014-09-27 DIAGNOSIS — I252 Old myocardial infarction: Secondary | ICD-10-CM | POA: Insufficient documentation

## 2014-09-30 ENCOUNTER — Encounter (HOSPITAL_COMMUNITY)
Admission: RE | Admit: 2014-09-30 | Discharge: 2014-09-30 | Disposition: A | Discharge status: Home / Self Care | Payer: Self-pay | Source: Ambulatory Visit | Attending: Cardiovascular Disease | Admitting: Cardiovascular Disease

## 2014-10-02 ENCOUNTER — Encounter (HOSPITAL_COMMUNITY)
Admission: RE | Admit: 2014-10-02 | Discharge: 2014-10-02 | Disposition: A | Discharge status: Home / Self Care | Payer: Self-pay | Source: Ambulatory Visit | Attending: Cardiovascular Disease | Admitting: Cardiovascular Disease

## 2014-10-04 ENCOUNTER — Encounter (HOSPITAL_COMMUNITY)
Admission: RE | Admit: 2014-10-04 | Discharge: 2014-10-04 | Disposition: A | Discharge status: Home / Self Care | Payer: Self-pay | Source: Ambulatory Visit | Attending: Cardiovascular Disease | Admitting: Cardiovascular Disease

## 2014-10-07 ENCOUNTER — Encounter (HOSPITAL_COMMUNITY)
Admission: RE | Admit: 2014-10-07 | Discharge: 2014-10-07 | Disposition: A | Discharge status: Home / Self Care | Payer: Self-pay | Source: Ambulatory Visit | Attending: Cardiovascular Disease | Admitting: Cardiovascular Disease

## 2014-10-09 ENCOUNTER — Encounter (HOSPITAL_COMMUNITY)
Admission: RE | Admit: 2014-10-09 | Discharge: 2014-10-09 | Disposition: A | Discharge status: Home / Self Care | Payer: Self-pay | Source: Ambulatory Visit | Attending: Cardiovascular Disease | Admitting: Cardiovascular Disease

## 2014-10-11 ENCOUNTER — Encounter (HOSPITAL_COMMUNITY)
Admission: RE | Admit: 2014-10-11 | Discharge: 2014-10-11 | Disposition: A | Discharge status: Home / Self Care | Payer: Self-pay | Source: Ambulatory Visit | Attending: Cardiovascular Disease | Admitting: Cardiovascular Disease

## 2014-10-14 ENCOUNTER — Encounter (HOSPITAL_COMMUNITY)
Admission: RE | Admit: 2014-10-14 | Discharge: 2014-10-14 | Disposition: A | Discharge status: Home / Self Care | Payer: Self-pay | Source: Ambulatory Visit | Attending: Cardiovascular Disease | Admitting: Cardiovascular Disease

## 2014-10-16 ENCOUNTER — Encounter (HOSPITAL_COMMUNITY)
Admission: RE | Admit: 2014-10-16 | Discharge: 2014-10-16 | Disposition: A | Discharge status: Home / Self Care | Payer: Self-pay | Source: Ambulatory Visit | Attending: Cardiovascular Disease | Admitting: Cardiovascular Disease

## 2014-10-18 ENCOUNTER — Encounter (HOSPITAL_COMMUNITY)
Admission: RE | Admit: 2014-10-18 | Discharge: 2014-10-18 | Disposition: A | Discharge status: Home / Self Care | Payer: Self-pay | Source: Ambulatory Visit | Attending: Cardiovascular Disease | Admitting: Cardiovascular Disease

## 2014-10-21 ENCOUNTER — Encounter (HOSPITAL_COMMUNITY)
Admission: RE | Admit: 2014-10-21 | Discharge: 2014-10-21 | Disposition: A | Discharge status: Home / Self Care | Payer: Self-pay | Source: Ambulatory Visit | Attending: Cardiovascular Disease | Admitting: Cardiovascular Disease

## 2014-10-23 ENCOUNTER — Encounter (HOSPITAL_COMMUNITY)
Admission: RE | Admit: 2014-10-23 | Discharge: 2014-10-23 | Disposition: A | Discharge status: Home / Self Care | Payer: Self-pay | Source: Ambulatory Visit | Attending: Cardiovascular Disease | Admitting: Cardiovascular Disease

## 2014-10-25 ENCOUNTER — Encounter (HOSPITAL_COMMUNITY)
Admission: RE | Admit: 2014-10-25 | Discharge: 2014-10-25 | Disposition: A | Discharge status: Home / Self Care | Payer: Self-pay | Source: Ambulatory Visit | Attending: Cardiovascular Disease | Admitting: Cardiovascular Disease

## 2014-10-28 ENCOUNTER — Encounter: Payer: Self-pay | Admitting: Neurology

## 2014-10-28 ENCOUNTER — Encounter (HOSPITAL_COMMUNITY)
Admission: RE | Admit: 2014-10-28 | Discharge: 2014-10-28 | Disposition: A | Discharge status: Home / Self Care | Payer: Self-pay | Source: Ambulatory Visit | Attending: Cardiovascular Disease | Admitting: Cardiovascular Disease

## 2014-10-28 ENCOUNTER — Ambulatory Visit (INDEPENDENT_AMBULATORY_CARE_PROVIDER_SITE_OTHER): Payer: Medicare Other | Admitting: Neurology

## 2014-10-28 VITALS — BP 111/66 | HR 72 | Ht 71.0 in | Wt 237.0 lb

## 2014-10-28 DIAGNOSIS — G40209 Localization-related (focal) (partial) symptomatic epilepsy and epileptic syndromes with complex partial seizures, not intractable, without status epilepticus: Secondary | ICD-10-CM | POA: Diagnosis not present

## 2014-10-28 DIAGNOSIS — Z5181 Encounter for therapeutic drug level monitoring: Secondary | ICD-10-CM | POA: Diagnosis not present

## 2014-10-28 DIAGNOSIS — E785 Hyperlipidemia, unspecified: Secondary | ICD-10-CM | POA: Insufficient documentation

## 2014-10-28 DIAGNOSIS — I2510 Atherosclerotic heart disease of native coronary artery without angina pectoris: Secondary | ICD-10-CM | POA: Insufficient documentation

## 2014-10-28 DIAGNOSIS — Q8501 Neurofibromatosis, type 1: Secondary | ICD-10-CM | POA: Diagnosis not present

## 2014-10-28 DIAGNOSIS — Z5189 Encounter for other specified aftercare: Secondary | ICD-10-CM | POA: Insufficient documentation

## 2014-10-28 DIAGNOSIS — I252 Old myocardial infarction: Secondary | ICD-10-CM | POA: Insufficient documentation

## 2014-10-28 MED ORDER — CARBAMAZEPINE ER 200 MG PO TB12: 600.0000 mg | ORAL_TABLET | Freq: Two times a day (BID) | ORAL | Status: DC

## 2014-10-28 MED ORDER — LEVETIRACETAM 500 MG PO TABS
ORAL_TABLET | ORAL | Status: DC
Start: 2014-10-28 — End: 2015-10-28

## 2014-10-28 NOTE — Progress Notes (Signed)
Reason for visit: Seizures  Jeff [name] is an 49 y.o. male  History of present illness:  Jeff [name] is a 49 year old left-handed white male with a history of neurofibromatosis type I, and seizures. The patient has done relatively well since last seen. He is on brand-name Tegretol, and Keppra. The patient is tolerating the medications well, without recurrence. The patient has been seen by Dr. Gean Quint in the past for left-sided tinnitus and some hearing loss. The patient last had MRI evaluation the brain done in February 2014. He has not noted a new numbness or weakness, hearing changes, or visual changes since last seen. He is driving a car, he has not had any seizures since last seen. He returns for further evaluation. He is exercising on a regular basis through cardiac rehabilitation. He just had his insulin pump replaced. He indicates that his last hemoglobin A1c was 7.2.  Past Medical History  Diagnosis Date  . CAD (coronary artery disease)     Cath 2011.  LAD stent patent, distal LAD occlusion, D1 100%, OM branch 80 - 90%, SVG to diag patent, LIMA to LAD occluded, SVG to RCA occluded, SVG to OM was occluded.   No change from previous cath.  Managed medically.   . Diabetes mellitus type I     on insulin pump at  home  . Sleep apnea     s/p oral surgery  . Hyperlipidemia   . 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 Diabetic retinopathy     s/p vitrectomy and history of retinal surgery  . HTN (hypertension)   . Seizure disorder   . Seasonal allergic rhinitis   . Depression   . Cataract   . Hemorrhoid   . Myocardial infarction   . Anxiety     Past Surgical History  Procedure Laterality Date  . Coronary artery bypass graft      4 vessel 4/11  . Uvulopalatoplasty surgery    . Tonsillectomy    . Release of right transverse carpal ligament    . Right eye vitrectomy and detached retina repair  10/2007  . Laparoscopic appendectomy  11/24/2011    . Appendectomy    . Left heart catheterization with coronary/graft angiogram N/A 02/22/2013    Procedure: LEFT HEART CATHETERIZATION WITH [name];  Surgeon: Tonny Bollman, MD;  Location: [facility] CATH LAB;  Service: Cardiovascular;  Laterality: N/A;    Family History  Problem Relation Age of Onset  . Coronary artery disease Father 34  . Diabetes Father     type II  . Hypertension Father   . Squamous cell carcinoma Brother   . Arthritis Sister     Social history:  reports that he has never smoked. He has never used smokeless tobacco. He reports that he does not drink alcohol or use illicit drugs.    Allergies  Allergen Reactions  . Latex Rash    Medications:  Prior to Admission medications   Medication Sig Start Date End Date Taking? Authorizing Provider  ALPRAZolam Prudy Feeler) 0.5 MG tablet Take 0.5 mg by mouth at bedtime as needed for anxiety.   Yes Historical Provider, MD  aspirin 81 MG tablet Take 81 mg by mouth daily.    Yes Historical Provider, MD  carbamazepine (TEGRETOL XR) 200 MG 12 hr tablet Take 3 tablets (600 mg total) by mouth 2 (two) times daily. Brand is medically necessary. 10/28/14  Yes York Spaniel, MD  docusate sodium (COLACE) 100 MG capsule Take 100 mg by mouth daily.    Yes Historical Provider, MD  ezetimibe (ZETIA) 10 MG tablet Take 10 mg by mouth at bedtime.    Yes Historical Provider, MD  Insulin Human (INSULIN PUMP) 100 unit/ml SOLN Inject into the skin 3 (three) times daily before meals. Add 8-11 units per insulin pump 3 times daily before meals per bolus scale.   Yes Historical Provider, MD  isosorbide mononitrate (IMDUR) 60 MG 24 hr tablet Take 60 mg by mouth every morning.    Yes Historical Provider, MD  Lancets [facility] ULTRASOFT) lancets 1 each by Other route See admin instructions. Check blood sugar 4 times daily. Check blood sugar 6-7 times daily on days going to cardiac rehab. 01/15/13  Yes Historical Provider, MD  levETIRAcetam (KEPPRA)  500 MG tablet Take 250 mg every morning and 500 mg every evening. 10/28/14  Yes York Spaniel, MD  lisinopril (PRINIVIL,ZESTRIL) 5 MG tablet Take 5 mg by mouth daily.   Yes Historical Provider, MD  metoprolol tartrate (LOPRESSOR) 25 MG tablet take 1 tablet by mouth twice a day 05/29/14  Yes Tonny Bollman, MD  multivitamin (ONE-A-DAY MEN'S) TABS Take 1 tablet by mouth daily.    Yes Historical Provider, MD  NITROSTAT 0.4 MG SL tablet place 1 tablet under the tongue if needed every 5 minutes for chest pain 06/26/14  Yes Tonny Bollman, MD  NOVOLOG 100 UNIT/ML injection Novolog Insulin Basal Rate as follows: 12 am .9 units, 6 am 1.9 units, 10 am 2.0 units, 12 pm 2.2 units 02/21/13  Yes Historical Provider, MD  ONE TOUCH ULTRA TEST test strip 1 each by Other route See admin instructions. Check blood sugar 4 times daily. Check blood sugar 6-7 times daily when going to cardiac rehab. 01/13/13  Yes Historical Provider, MD  rosuvastatin (CRESTOR) 40 MG tablet Take 40 mg by mouth at bedtime.    Yes Historical Provider, MD  sertraline (ZOLOFT) 100 MG tablet Take 100 mg by mouth at bedtime.    Yes Historical Provider, MD  vitamin B-12 (CYANOCOBALAMIN) 500 MCG tablet Take 500 mcg by mouth daily.   Yes Historical Provider, MD    ROS:  Out of a complete 14 system review of symptoms, the patient complains only of the following symptoms, and all other reviewed systems are negative.  Fatigue Hearing loss, ringing in the ears Rectal bleeding, constipation Sleep apnea, daytime sleepiness, snoring Joint pain Itching Depression, anxiety  Blood pressure 111/66, pulse 72, height 5\' 11"  (1.803 m), weight 237 lb (107.502 kg).  Physical Exam  General: The patient is alert and cooperative at the time of the examination.  Skin: No significant peripheral edema is noted.   Neurologic Exam  Mental status: The patient is alert and oriented x 3 at the time of the examination. The patient has apparent normal recent  and remote memory, with an apparently normal attention span and concentration ability.   Cranial nerves: Facial symmetry is present, with exception that the left eye appears to be slightly proptotic relative to the right. Speech is normal, no aphasia or dysarthria is noted. Extraocular movements are full. Visual fields are full.  Motor: The patient has good strength in all 4 extremities.  Sensory examination: Soft touch sensation is symmetric on the face, arms, and legs.  Coordination: The patient has good finger-nose-finger and heel-to-shin bilaterally.  Gait and station: The patient has a normal gait. Tandem gait is slightly unsteady. Romberg is negative. No drift  is seen.  Reflexes: Deep tendon reflexes are symmetric.   Assessment/Plan:  1. Neurofibromatosis type I  2. History of seizures  The patient doing relatively well on his current medications. We will check blood work today, and we will follow-up in about one year, or sooner if needed. We will consider repeating MRI evaluation of the brain on that visit. He will contact us if any issues arise.  Marlan Palau MD 10/28/2014 8:40 PM  [facility] [address] [place], [place] [address]  Phone [phone] Fax [phone]

## 2014-10-28 NOTE — Patient Instructions (Signed)

## 2014-10-29 ENCOUNTER — Telehealth: Payer: Self-pay | Admitting: Neurology

## 2014-10-29 LAB — COMPREHENSIVE METABOLIC PANEL
ALK PHOS: 81 IU/L (ref 39–117)
ALT: 35 IU/L (ref 0–44)
AST: 24 IU/L (ref 0–40)
Albumin/Globulin Ratio: 2.3 (ref 1.1–2.5)
Albumin: 4.1 g/dL (ref 3.5–5.5)
BUN/Creatinine Ratio: 14 (ref 9–20)
BUN: 10 mg/dL (ref 6–24)
Bilirubin Total: 0.3 mg/dL (ref 0.0–1.2)
CALCIUM: 8.5 mg/dL — AB (ref 8.7–10.2)
CO2: 25 mmol/L (ref 18–29)
CREATININE: 0.74 mg/dL — AB (ref 0.76–1.27)
Chloride: 100 mmol/L (ref 97–108)
GFR calc Af Amer: 124 mL/min/{1.73_m2} (ref >59)
GFR calc non Af Amer: 108 mL/min/{1.73_m2} (ref >59)
Globulin, Total: 1.8 g/dL (ref 1.5–4.5)
Glucose: 185 mg/dL — ABNORMAL HIGH (ref 65–99)
Potassium: 4.5 mmol/L (ref 3.5–5.2)
SODIUM: 138 mmol/L (ref 134–144)
Total Protein: 5.9 g/dL — ABNORMAL LOW (ref 6.0–8.5)

## 2014-10-29 LAB — CBC WITH DIFFERENTIAL/PLATELET
BASOS ABS: 0 10*3/uL (ref 0.0–0.2)
Basos: 0 %
EOS (ABSOLUTE): 0.2 10*3/uL (ref 0.0–0.4)
EOS: 4 %
HEMOGLOBIN: 14.2 g/dL (ref 12.6–17.7)
Hematocrit: 41.4 % (ref 37.5–51.0)
IMMATURE GRANULOCYTES: 0 %
Immature Grans (Abs): 0 10*3/uL (ref 0.0–0.1)
Lymphocytes Absolute: 1.2 10*3/uL (ref 0.7–3.1)
Lymphs: 26 %
MCH: 30.5 pg (ref 26.6–33.0)
MCHC: 34.3 g/dL (ref 31.5–35.7)
MCV: 89 fL (ref 79–97)
MONOS ABS: 0.5 10*3/uL (ref 0.1–0.9)
Monocytes: 11 %
NEUTROS PCT: 59 %
Neutrophils Absolute: 2.6 10*3/uL (ref 1.4–7.0)
PLATELETS: 183 10*3/uL (ref 150–379)
RBC: 4.65 x10E6/uL (ref 4.14–5.80)
RDW: 13.7 % (ref 12.3–15.4)
WBC: 4.5 10*3/uL (ref 3.4–10.8)

## 2014-10-29 LAB — CARBAMAZEPINE LEVEL, TOTAL: CARBAMAZEPINE LVL: 9.2 ug/mL (ref 4.0–12.0)

## 2014-10-29 NOTE — Telephone Encounter (Signed)
I called patient. Blood work is roughly unremarkable with a normal CBC, or making level is therapeutic at 9.2. The blood sugar was 185, random blood sugar. Total protein level was minimally low, as was the calcium level. This is likely not clinically significant.

## 2014-10-30 ENCOUNTER — Encounter (HOSPITAL_COMMUNITY): Payer: Self-pay

## 2014-11-01 ENCOUNTER — Encounter (HOSPITAL_COMMUNITY)
Admission: RE | Admit: 2014-11-01 | Discharge: 2014-11-01 | Disposition: A | Discharge status: Home / Self Care | Payer: Self-pay | Source: Ambulatory Visit | Attending: Cardiovascular Disease | Admitting: Cardiovascular Disease

## 2014-11-04 ENCOUNTER — Encounter (HOSPITAL_COMMUNITY)
Admission: RE | Admit: 2014-11-04 | Discharge: 2014-11-04 | Disposition: A | Discharge status: Home / Self Care | Payer: Self-pay | Source: Ambulatory Visit | Attending: Cardiovascular Disease | Admitting: Cardiovascular Disease

## 2014-11-06 ENCOUNTER — Encounter (HOSPITAL_COMMUNITY)
Admission: RE | Admit: 2014-11-06 | Discharge: 2014-11-06 | Disposition: A | Discharge status: Home / Self Care | Payer: Self-pay | Source: Ambulatory Visit | Attending: Cardiovascular Disease | Admitting: Cardiovascular Disease

## 2014-11-08 ENCOUNTER — Encounter (HOSPITAL_COMMUNITY)
Admission: RE | Admit: 2014-11-08 | Discharge: 2014-11-08 | Disposition: A | Discharge status: Home / Self Care | Payer: Self-pay | Source: Ambulatory Visit | Attending: Cardiovascular Disease | Admitting: Cardiovascular Disease

## 2014-11-11 ENCOUNTER — Encounter (HOSPITAL_COMMUNITY)
Admission: RE | Admit: 2014-11-11 | Discharge: 2014-11-11 | Disposition: A | Discharge status: Home / Self Care | Payer: Self-pay | Source: Ambulatory Visit | Attending: Cardiovascular Disease | Admitting: Cardiovascular Disease

## 2014-11-13 ENCOUNTER — Encounter (HOSPITAL_COMMUNITY)
Admission: RE | Admit: 2014-11-13 | Discharge: 2014-11-13 | Disposition: A | Discharge status: Home / Self Care | Payer: Self-pay | Source: Ambulatory Visit | Attending: Cardiovascular Disease | Admitting: Cardiovascular Disease

## 2014-11-15 ENCOUNTER — Encounter (HOSPITAL_COMMUNITY): Payer: Self-pay

## 2014-11-18 ENCOUNTER — Encounter (HOSPITAL_COMMUNITY)
Admission: RE | Admit: 2014-11-18 | Discharge: 2014-11-18 | Disposition: A | Discharge status: Home / Self Care | Payer: Self-pay | Source: Ambulatory Visit | Attending: Cardiovascular Disease | Admitting: Cardiovascular Disease

## 2014-11-18 NOTE — Progress Notes (Signed)
Pt returned to cardiac rehab maintenance program after absence on Friday due to "chest pain" he experienced while swallowing.  Pt contacted his primary MD who told him not to exercise on Friday.  Continue to monitor over the weekend and if the pain persist on Monday to give there office a call.  Pain did persist with swallowing.  No difference noted between food and liquid nor with temperature of the contents.  Pt was advised to come on to exercise to see if he "could" exercise without eliciting any pain. Called Dr. Darcus Austin office and talked with Marchelle Folks.  Marchelle Folks confirmed this to be accurate.  Dr. Marveen Reeks does not feel this is cardiac in nature.  Asked about follow up in the office or GI consult. No further intervention needed at this time.  "Pt is aware of the plan." Called back and left message for amanda to have Dr. Marveen Reeks indicated that he was ok with pt proceeding with exercise for medical documentation. Alanson Aly, BSN

## 2014-11-20 ENCOUNTER — Encounter (HOSPITAL_COMMUNITY)
Admission: RE | Admit: 2014-11-20 | Discharge: 2014-11-20 | Disposition: A | Discharge status: Home / Self Care | Payer: Self-pay | Source: Ambulatory Visit | Attending: Cardiovascular Disease | Admitting: Cardiovascular Disease

## 2014-11-22 ENCOUNTER — Encounter (HOSPITAL_COMMUNITY)
Admission: RE | Admit: 2014-11-22 | Discharge: 2014-11-22 | Disposition: A | Discharge status: Home / Self Care | Payer: Self-pay | Source: Ambulatory Visit | Attending: Cardiovascular Disease | Admitting: Cardiovascular Disease

## 2014-11-22 ENCOUNTER — Telehealth: Payer: Self-pay | Admitting: Gastroenterology

## 2014-11-22 NOTE — Telephone Encounter (Signed)
Pt has painful swallowing sch with Jeff [name] for 11/26/14

## 2014-11-23 ENCOUNTER — Other Ambulatory Visit: Payer: Self-pay | Admitting: Cardiovascular Disease

## 2014-11-25 ENCOUNTER — Ambulatory Visit (INDEPENDENT_AMBULATORY_CARE_PROVIDER_SITE_OTHER): Payer: Medicare Other

## 2014-11-25 ENCOUNTER — Ambulatory Visit (INDEPENDENT_AMBULATORY_CARE_PROVIDER_SITE_OTHER): Payer: Medicare Other | Admitting: Family Medicine

## 2014-11-25 ENCOUNTER — Ambulatory Visit: Payer: Medicare Other

## 2014-11-25 ENCOUNTER — Other Ambulatory Visit: Payer: Self-pay | Admitting: Family Medicine

## 2014-11-25 VITALS — BP 120/60 | HR 73 | Temp 97.9°F | Resp 18 | Ht 71.0 in | Wt 236.0 lb

## 2014-11-25 DIAGNOSIS — M79674 Pain in right toe(s): Secondary | ICD-10-CM

## 2014-11-25 DIAGNOSIS — S92911A Unspecified fracture of right toe(s), initial encounter for closed fracture: Secondary | ICD-10-CM

## 2014-11-25 NOTE — Progress Notes (Addendum)
Patient ID: Jeff [name], male   DOB: 12-11-1965, 49 y.o.   MRN: [id number]   Subjective:  This chart was scribed for Nilda Simmer, MD by [facility], medical scribe at [facility].The patient was seen in exam room 08 and the patient's care was started at 11:05 AM.   Patient ID: Jeff [name], male    DOB: 08-Oct-1965, 49 y.o.   MRN: [id number]  11/25/2014  Toe Pain   HPI HPI Comments: Jeff [name] is a 49 y.o. male who presents to [facility] complaining of right big toe injury, acute onset two days ago. He has pain at the right big toe, second and third toe also sting. He was walking down the stairs at home and slipped. He thinks he may have kicked the hand rail or the corner of the wall while falling.  Elevating and icing the foot for relief. He has been able to bear weight but with pain.  Known DMI and CAD.  Review of Systems  Constitutional: Negative for fever, chills, diaphoresis, activity change, appetite change and fatigue.  Respiratory: Negative for cough and shortness of breath.   Cardiovascular: Negative for chest pain, palpitations and leg swelling.  Gastrointestinal: Negative for nausea, vomiting, abdominal pain and diarrhea.  Endocrine: Negative for cold intolerance, heat intolerance, polydipsia, polyphagia and polyuria.  Musculoskeletal: Positive for joint swelling, arthralgias and gait problem.  Skin: Negative for color change, rash and wound.  Neurological: Negative for dizziness, tremors, seizures, syncope, facial asymmetry, speech difficulty, weakness, light-headedness, numbness and headaches.  Psychiatric/Behavioral: Negative for sleep disturbance and dysphoric mood. The patient is not nervous/anxious.    Past Medical History  Diagnosis Date   CAD (coronary artery disease)     Cath 2011.  LAD stent patent, distal LAD occlusion, D1 100%, OM branch 80 - 90%, SVG to diag patent, LIMA to LAD occluded, SVG to RCA occluded, SVG to  OM was occluded.   No change from previous cath.  Managed medically.    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   Diabetic retinopathy     s/p vitrectomy and history of retinal surgery   HTN (hypertension)    Seizure disorder    Seasonal allergic rhinitis    Depression    Cataract    Hemorrhoid    Myocardial infarction    Anxiety    Past Surgical History  Procedure Laterality Date   Coronary artery bypass graft      4 vessel 4/11   Uvulopalatoplasty surgery     Tonsillectomy     Release of right transverse carpal ligament     Right eye vitrectomy and detached retina repair  10/2007   Laparoscopic appendectomy  11/24/2011   Appendectomy     Left heart catheterization with coronary/graft angiogram N/A 02/22/2013    Procedure: LEFT HEART CATHETERIZATION WITH [name];  Surgeon: Tonny Bollman, MD;  Location: [facility] CATH LAB;  Service: Cardiovascular;  Laterality: N/A;   Allergies  Allergen Reactions   Latex Rash   Current Outpatient Prescriptions  Medication Sig Dispense Refill   ALPRAZolam (XANAX) 0.5 MG tablet Take 0.5 mg by mouth at bedtime as needed for anxiety.     aspirin 81 MG tablet Take 81 mg by  mouth daily.      carbamazepine (TEGRETOL XR) 200 MG 12 hr tablet Take 3 tablets (600 mg total) by mouth 2 (two) times daily. Brand is medically necessary. 180 tablet 11   docusate sodium (COLACE) 100 MG capsule Take 100 mg by mouth daily.      ezetimibe (ZETIA) 10 MG tablet Take 10 mg by mouth at bedtime.      Insulin Human (INSULIN PUMP) 100 unit/ml SOLN Inject into the skin 3 (three) times daily before meals. Add 8-11 units per insulin pump 3 times daily before meals per bolus scale.     isosorbide mononitrate (IMDUR) 60 MG 24 hr tablet Take 60 mg by mouth every morning.      Lancets (ONETOUCH  ULTRASOFT) lancets 1 each by Other route See admin instructions. Check blood sugar 4 times daily. Check blood sugar 6-7 times daily on days going to cardiac rehab.     levETIRAcetam (KEPPRA) 500 MG tablet Take 250 mg every morning and 500 mg every evening. 45 tablet 11   lisinopril (PRINIVIL,ZESTRIL) 5 MG tablet Take 5 mg by mouth daily.     metoprolol tartrate (LOPRESSOR) 25 MG tablet take 1 tablet by mouth twice a day 60 tablet 5   multivitamin (ONE-A-DAY MEN'S) TABS Take 1 tablet by mouth daily.      NITROSTAT 0.4 MG SL tablet place 1 tablet under the tongue if needed every 5 minutes for chest pain 25 tablet 5   NOVOLOG 100 UNIT/ML injection Novolog Insulin Basal Rate as follows: 12 am .9 units, 6 am 1.9 units, 10 am 2.0 units, 12 pm 2.2 units     ONE TOUCH ULTRA TEST test strip 1 each by Other route See admin instructions. Check blood sugar 4 times daily. Check blood sugar 6-7 times daily when going to cardiac rehab.     rosuvastatin (CRESTOR) 40 MG tablet Take 40 mg by mouth at bedtime.      sertraline (ZOLOFT) 100 MG tablet Take 100 mg by mouth at bedtime.      vitamin B-12 (CYANOCOBALAMIN) 500 MCG tablet Take 500 mcg by mouth daily.     No current facility-administered medications for this visit.       Objective:    BP 120/60 mmHg   Pulse 73   Temp(Src) 97.9 F (36.6 C) (Oral)   Resp 18   Ht  (1.803 m)   Wt 236 lb (107.049 kg)   BMI 32.93 kg/m2   SpO2 98% Physical Exam  Constitutional: He is oriented to person, place, and time. He appears well-developed and well-nourished. No distress.  HENT:  Head: Normocephalic and atraumatic.  Eyes: Conjunctivae and EOM are normal. Pupils are equal, round, and reactive to light.  Neck: Normal range of motion. Neck supple. Carotid bruit is not present.  Pulmonary/Chest: Effort normal and breath sounds normal.  Musculoskeletal:  Mild tenderness at the 3rd right digit and full ROM. The 2nd right digit is TTP. The first MCP is  TTP The Right greater toes is exquisitely tender at the IP.  Ecchymosis at the base of the right great toe and second toe  Neurological: He is alert and oriented to person, place, and time. No cranial nerve deficit.  Skin: Skin is warm and dry. No rash noted. He is not diaphoretic.  Psychiatric: He has a normal mood and affect. His behavior is normal.  Nursing note and vitals reviewed.  Results for orders placed or performed in visit on 10/28/14  Comprehensive metabolic  panel  Result Value Ref Range   Glucose 185 (H) 65 - 99 mg/dL   BUN 10 6 - 24 mg/dL   Creatinine, Ser 2.63 (L) 0.76 - 1.27 mg/dL   GFR calc non Af Amer 108 >59 mL/min/1.73   GFR calc Af Amer 124 >59 mL/min/1.73   BUN/Creatinine Ratio 14 9 - 20   Sodium 138 134 - 144 mmol/L   Potassium 4.5 3.5 - 5.2 mmol/L   Chloride 100 97 - 108 mmol/L   CO2 25 18 - 29 mmol/L   Calcium 8.5 (L) 8.7 - 10.2 mg/dL   Total Protein 5.9 (L) 6.0 - 8.5 g/dL   Albumin 4.1 3.5 - 5.5 g/dL   Globulin, Total 1.8 1.5 - 4.5 g/dL   Albumin/Globulin Ratio 2.3 1.1 - 2.5   Bilirubin Total 0.3 0.0 - 1.2 mg/dL   Alkaline Phosphatase 81 39 - 117 IU/L   AST 24 0 - 40 IU/L   ALT 35 0 - 44 IU/L  Carbamazepine level, total  Result Value Ref Range   Carbamazepine Lvl 9.2 4.0 - 12.0 ug/mL  CBC with Differential/Platelet  Result Value Ref Range   WBC 4.5 3.4 - 10.8 x10E3/uL   RBC 4.65 4.14 - 5.80 x10E6/uL   Hemoglobin 14.2 12.6 - 17.7 g/dL   Hematocrit 33.5 45.6 - 51.0 %   MCV 89 79 - 97 fL   MCH 30.5 26.6 - 33.0 pg   MCHC 34.3 31.5 - 35.7 g/dL   RDW 25.6 38.9 - 37.3 %   Platelets 183 150 - 379 x10E3/uL   NEUTROPHILS 59 %   Lymphs 26 %   Monocytes 11 %   Eos 4 %   Basos 0 %   Neutrophils Absolute 2.6 1.4 - 7.0 x10E3/uL   Lymphocytes Absolute 1.2 0.7 - 3.1 x10E3/uL   Monocytes Absolute 0.5 0.1 - 0.9 x10E3/uL   EOS (ABSOLUTE) 0.2 0.0 - 0.4 x10E3/uL   Basophils Absolute 0.0 0.0 - 0.2 x10E3/uL   Immature Granulocytes 0 %   Immature Grans (Abs)  0.0 0.0 - 0.1 x10E3/uL   [facility] reading (PRIMARY) by  Dr. Katrinka Blazing.  R FOOT: FIRST TOE PHALANX FRACTURE NON-DISPLACED.      Assessment & Plan:   1. Toe fracture, right, closed, initial encounter   2. Pain in toe of right foot    -New. -Buddy tape first and second toes. -Placed in post-op shoe. -refer to ortho for management. -avoid exercise until ortho consultation. -Motrin or Tylenol PRN. -Ice and elevate foot.  No orders of the defined types were placed in this encounter.    Return if symptoms worsen or fail to improve.   I personally performed the services described in this documentation, which was scribed in my presence. The recorded information has been reviewed and considered.  Kristi Paulita Fujita, M.D. [facility] [address] [place], [place]  [address] [phone] phone [phone] fax

## 2014-11-25 NOTE — Patient Instructions (Signed)

## 2014-11-26 ENCOUNTER — Ambulatory Visit (INDEPENDENT_AMBULATORY_CARE_PROVIDER_SITE_OTHER): Payer: Medicare Other | Admitting: Nurse Practitioner

## 2014-11-26 ENCOUNTER — Encounter: Payer: Self-pay | Admitting: Nurse Practitioner

## 2014-11-26 VITALS — BP 100/72 | HR 68 | Ht 71.0 in | Wt 233.8 lb

## 2014-11-26 DIAGNOSIS — R1310 Dysphagia, unspecified: Secondary | ICD-10-CM

## 2014-11-26 NOTE — Progress Notes (Signed)
HPI :   Patient is a 49 year old male known to Dr. Christella Hartigan. He had a colonoscopy for evaluation of hematochezia January 2009. Exam was normal.  Patient is referred by PCP for evaluation of pain with swallowing. A couple of weeks ago patient suddenly developed chest pain associated with swallowing liquids. Interestingly, solids cause no discomfort. Temperature liquids irrelevant. The pain occurs immediately upon swallowing liquids and last for just a couple of seconds. No coughing. No shortness of breath. Patient has not had any recent aunt biotic snore steroid-induced. He has no dysphasia. Patient took Zantac for several days without any improvement.   Past Medical History  Diagnosis Date  . CAD (coronary artery disease)     Cath 2011.  LAD stent patent, distal LAD occlusion, D1 100%, OM branch 80 - 90%, SVG to diag patent, LIMA to LAD occluded, SVG to RCA occluded, SVG to OM was occluded.   No change from previous cath.  Managed medically.   . Diabetes mellitus type I     on insulin pump at  home  . Sleep apnea     s/p oral surgery  . Hyperlipidemia   . 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 Diabetic retinopathy     s/p vitrectomy and history of retinal surgery  . HTN (hypertension)   . Seizure disorder   . Seasonal allergic rhinitis   . Depression   . Cataract   . Hemorrhoid   . Myocardial infarction   . Anxiety     Family History  Problem Relation Age of Onset  . Coronary artery disease Father 51  . Diabetes Father     type II  . Hypertension Father   . Squamous cell carcinoma Brother   . Arthritis Sister    History  Substance Use Topics  . Smoking status: Never Smoker   . Smokeless tobacco: Never Used  . Alcohol Use: No   Current Outpatient Prescriptions  Medication Sig Dispense Refill  . ALPRAZolam (XANAX) 0.5 MG tablet Take 0.5 mg by mouth at bedtime as needed for anxiety.    [name] aspirin 81 MG tablet Take 81 mg by mouth  daily.     . carbamazepine (TEGRETOL XR) 200 MG 12 hr tablet Take 3 tablets (600 mg total) by mouth 2 (two) times daily. Brand is medically necessary. 180 tablet 11  . docusate sodium (COLACE) 100 MG capsule Take 100 mg by mouth daily.     [name] ezetimibe (ZETIA) 10 MG tablet Take 10 mg by mouth at bedtime.     . Insulin Human (INSULIN PUMP) 100 unit/ml SOLN Inject into the skin 3 (three) times daily before meals. Add 8-11 units per insulin pump 3 times daily before meals per bolus scale.    . isosorbide mononitrate (IMDUR) 60 MG 24 hr tablet Take 60 mg by mouth every morning.     . Lancets (ONETOUCH ULTRASOFT) lancets 1 each by Other route See admin instructions. Check blood sugar 4 times daily. Check blood sugar 6-7 times daily on days going to cardiac rehab.    . levETIRAcetam (KEPPRA) 500 MG tablet Take 250 mg every morning and 500 mg every evening. 45 tablet 11  . lisinopril (PRINIVIL,ZESTRIL) 5 MG tablet Take 5 mg by mouth daily.    . metoprolol tartrate (LOPRESSOR) 25 MG tablet take 1 tablet by mouth twice a day 60 tablet 1  . multivitamin (ONE-A-DAY MEN'S) TABS Take 1 tablet by mouth  daily.     [name] NITROSTAT 0.4 MG SL tablet place 1 tablet under the tongue if needed every 5 minutes for chest pain 25 tablet 5  . NOVOLOG 100 UNIT/ML injection Novolog Insulin Basal Rate as follows: 12 am .9 units, 6 am 1.9 units, 10 am 2.0 units, 12 pm 2.2 units    . ONE TOUCH ULTRA TEST test strip 1 each by Other route See admin instructions. Check blood sugar 4 times daily. Check blood sugar 6-7 times daily when going to cardiac rehab.    . rosuvastatin (CRESTOR) 40 MG tablet Take 40 mg by mouth at bedtime.     . sertraline (ZOLOFT) 100 MG tablet Take 100 mg by mouth at bedtime.     . vitamin B-12 (CYANOCOBALAMIN) 500 MCG tablet Take 500 mcg by mouth daily.     No current facility-administered medications for this visit.   Allergies  Allergen Reactions  . Latex Rash    Review of Systems: Positive for  anxiety, arthritis, vision changes, depression, fatigue and sleeping problems. All other systems reviewed and negative except where noted in HPI.   Physical Exam: BP 100/72 mmHg  Pulse 68  Ht 5\' 11"  (1.803 m)  Wt 233 lb 12.8 oz (106.051 kg)  BMI 32.62 kg/m2 Constitutional: Pleasant,well-developed, white male in no acute distress. HEENT: Asymmetry of face.  Conjunctivae are normal. No scleral icterus. No obvious candida Neck supple.  Cardiovascular: Normal rate, regular rhythm.  Pulmonary/chest: Effort normal and breath sounds normal. No wheezing, rales or rhonchi. Abdominal: Soft, nondistended, nontender. Bowel sounds active throughout. There are no masses palpable. No hepatomegaly. Extremities: no edema Lymphadenopathy: No cervical adenopathy noted. Neurological: Alert and oriented to person place and time. Skin: Skin is warm and dry. No rashes noted. Psychiatric: Normal mood and affect. Behavior is normal.   ASSESSMENT AND PLAN:  34. 49 year old male with chest pain related to swallowing liquids (solids fine). Symptoms ongoing for 2 weeks, no relief with H2 blocker. Interesting case as pain is only associated with swallowing liquids. We discussed options such as watch and wait versus proceeding with upper endoscopy. Patient is interested in EGD. He will need to be done at the hospital (wears oxygen at night). The benefits, risks, and potential complications of EGD with possible biopsies were discussed with the patient and he agrees to proceed.   2. Sleep apnea, uses oxygen at night  3. Diabetes, has pump.   4. Neurofibromatosis.   Rodrigo Ran, MD

## 2014-11-26 NOTE — Patient Instructions (Addendum)
We will contact the MD that manages your Insulin Pump advising them of your procedure.   We will contact you with a date for the Endoscopy with Dr. Rob Bunting at [facility].

## 2014-11-27 ENCOUNTER — Encounter (HOSPITAL_COMMUNITY): Payer: Self-pay | Admitting: *Deleted

## 2014-11-27 ENCOUNTER — Other Ambulatory Visit: Payer: Medicare Other | Admitting: *Deleted

## 2014-11-27 DIAGNOSIS — R1310 Dysphagia, unspecified: Secondary | ICD-10-CM

## 2014-11-28 DIAGNOSIS — R1310 Dysphagia, unspecified: Secondary | ICD-10-CM | POA: Insufficient documentation

## 2014-11-28 NOTE — Progress Notes (Signed)
i agree.  OK to schedule for EGD at [facility] with MAC, next available EUS Thursday (should have time next week)

## 2014-11-29 ENCOUNTER — Telehealth: Payer: Self-pay | Admitting: *Deleted

## 2014-11-29 ENCOUNTER — Telehealth: Payer: Self-pay

## 2014-11-29 NOTE — Telephone Encounter (Signed)
Called and asked Synetta Fail at [facility] if she got the fax from me for the Insulin Pump instructions request to [employer] D.  She said she did get it.  She told me to call [employer]er, fashion/clothing) on Monday 12-02-2014. Cassie is in Tues.

## 2014-11-29 NOTE — Telephone Encounter (Signed)
X-ray Disc right foot  Pick up today  Will sign ROI  When he comes in   [phone]

## 2014-11-29 NOTE — Telephone Encounter (Signed)
At the patient's request, I made a copy of his Foot x ray, on CD and placed in the pick up drawer.

## 2014-12-03 ENCOUNTER — Telehealth: Payer: Self-pay | Admitting: *Deleted

## 2014-12-03 NOTE — Telephone Encounter (Signed)
Lm for [employer] regarding insulin pump instructions for colonoscopy scheduled for Thursday 12-05-2014.

## 2014-12-03 NOTE — Telephone Encounter (Signed)
I got a call from Heath at [facility].  She told me to tell the patient to continue his Basal insulin pump as usual and the morning of the procedure before he leaves the house, he is to run a temporary basal insulin for 80 % for 4 hours.  Patient verbalized understanding the instructions.

## 2014-12-04 ENCOUNTER — Encounter (HOSPITAL_COMMUNITY): Payer: Self-pay | Admitting: Anesthesiology

## 2014-12-04 NOTE — Anesthesia Preprocedure Evaluation (Signed)
Anesthesia Evaluation  Patient identified by MRN, date of birth, ID band Patient awake    Reviewed: Allergy & Precautions, NPO status , Patient's Chart, lab work & pertinent test results  Airway Mallampati: II  TM Distance: >3 FB Neck ROM: Full    Dental no notable dental hx.    Pulmonary sleep apnea ,  breath sounds clear to auscultation  Pulmonary exam normal       Cardiovascular Exercise Tolerance: Good hypertension, Pt. on medications and Pt. on home beta blockers + CAD and + Past MI Normal cardiovascular examRhythm:Regular Rate:Normal     Neuro/Psych PSYCHIATRIC DISORDERS Anxiety Depression negative neurological ROS     GI/Hepatic negative GI ROS, Neg liver ROS,   Endo/Other  diabetes, Type 1, Insulin Dependent  Renal/GU negative Renal ROS  negative genitourinary   Musculoskeletal negative musculoskeletal ROS (+)   Abdominal (+) + obese,   Peds negative pediatric ROS (+)  Hematology negative hematology ROS (+)   Anesthesia Other Findings   Reproductive/Obstetrics negative OB ROS                             Anesthesia Physical Anesthesia Plan  ASA: III  Anesthesia Plan: MAC   Post-op Pain Management:    Induction: Intravenous  Airway Management Planned: Natural Airway  Additional Equipment:   Intra-op Plan:   Post-operative Plan:   Informed Consent: I have reviewed the patients History and Physical, chart, labs and discussed the procedure including the risks, benefits and alternatives for the proposed anesthesia with the patient or authorized representative who has indicated his/her understanding and acceptance.   Dental advisory given  Plan Discussed with: CRNA  Anesthesia Plan Comments:         Anesthesia Quick Evaluation

## 2014-12-05 ENCOUNTER — Ambulatory Visit (HOSPITAL_COMMUNITY): Payer: Medicare Other | Admitting: Anesthesiology

## 2014-12-05 ENCOUNTER — Encounter (HOSPITAL_COMMUNITY)
Admission: RE | Disposition: A | Discharge status: Home / Self Care | Payer: Self-pay | Source: Ambulatory Visit | Attending: Gastroenterology

## 2014-12-05 ENCOUNTER — Ambulatory Visit (HOSPITAL_COMMUNITY)
Admission: RE | Admit: 2014-12-05 | Discharge: 2014-12-05 | Disposition: A | Discharge status: Home / Self Care | Payer: Medicare Other | Source: Ambulatory Visit | Attending: Gastroenterology | Admitting: Gastroenterology

## 2014-12-05 ENCOUNTER — Encounter (HOSPITAL_COMMUNITY): Payer: Self-pay | Admitting: *Deleted

## 2014-12-05 DIAGNOSIS — I2510 Atherosclerotic heart disease of native coronary artery without angina pectoris: Secondary | ICD-10-CM | POA: Diagnosis not present

## 2014-12-05 DIAGNOSIS — Z9641 Presence of insulin pump (external) (internal): Secondary | ICD-10-CM | POA: Diagnosis not present

## 2014-12-05 DIAGNOSIS — G4730 Sleep apnea, unspecified: Secondary | ICD-10-CM | POA: Insufficient documentation

## 2014-12-05 DIAGNOSIS — Q8500 Neurofibromatosis, unspecified: Secondary | ICD-10-CM | POA: Insufficient documentation

## 2014-12-05 DIAGNOSIS — I10 Essential (primary) hypertension: Secondary | ICD-10-CM | POA: Diagnosis not present

## 2014-12-05 DIAGNOSIS — E669 Obesity, unspecified: Secondary | ICD-10-CM | POA: Diagnosis not present

## 2014-12-05 DIAGNOSIS — Z79899 Other long term (current) drug therapy: Secondary | ICD-10-CM | POA: Diagnosis not present

## 2014-12-05 DIAGNOSIS — F419 Anxiety disorder, unspecified: Secondary | ICD-10-CM | POA: Diagnosis not present

## 2014-12-05 DIAGNOSIS — K222 Esophageal obstruction: Secondary | ICD-10-CM | POA: Insufficient documentation

## 2014-12-05 DIAGNOSIS — E109 Type 1 diabetes mellitus without complications: Secondary | ICD-10-CM | POA: Diagnosis not present

## 2014-12-05 DIAGNOSIS — R1310 Dysphagia, unspecified: Secondary | ICD-10-CM

## 2014-12-05 DIAGNOSIS — I252 Old myocardial infarction: Secondary | ICD-10-CM | POA: Diagnosis not present

## 2014-12-05 DIAGNOSIS — Z7982 Long term (current) use of aspirin: Secondary | ICD-10-CM | POA: Insufficient documentation

## 2014-12-05 DIAGNOSIS — Z9981 Dependence on supplemental oxygen: Secondary | ICD-10-CM | POA: Diagnosis not present

## 2014-12-05 DIAGNOSIS — F329 Major depressive disorder, single episode, unspecified: Secondary | ICD-10-CM | POA: Insufficient documentation

## 2014-12-05 DIAGNOSIS — E785 Hyperlipidemia, unspecified: Secondary | ICD-10-CM | POA: Diagnosis not present

## 2014-12-05 DIAGNOSIS — Z6832 Body mass index (BMI) 32.0-32.9, adult: Secondary | ICD-10-CM | POA: Insufficient documentation

## 2014-12-05 DIAGNOSIS — R079 Chest pain, unspecified: Secondary | ICD-10-CM | POA: Diagnosis present

## 2014-12-05 DIAGNOSIS — G40909 Epilepsy, unspecified, not intractable, without status epilepticus: Secondary | ICD-10-CM | POA: Diagnosis not present

## 2014-12-05 HISTORY — DX: Dependence on supplemental oxygen: Z99.81

## 2014-12-05 HISTORY — DX: Unspecified fracture of right toe(s), initial encounter for closed fracture: S92.911A

## 2014-12-05 HISTORY — PX: ESOPHAGOGASTRODUODENOSCOPY (EGD) WITH PROPOFOL: SHX5813

## 2014-12-05 LAB — GLUCOSE, CAPILLARY: GLUCOSE-CAPILLARY: 233 mg/dL — AB (ref 65–99)

## 2014-12-05 SURGERY — ESOPHAGOGASTRODUODENOSCOPY (EGD) WITH PROPOFOL
Anesthesia: Monitor Anesthesia Care

## 2014-12-05 MED ORDER — ONDANSETRON HCL 4 MG/2ML IJ SOLN
INTRAMUSCULAR | Status: DC | PRN
  Administered 2014-12-05: 4 mg via INTRAVENOUS

## 2014-12-05 MED ORDER — PROPOFOL 10 MG/ML IV BOLUS
INTRAVENOUS | Status: AC
  Filled 2014-12-05: qty 20

## 2014-12-05 MED ORDER — OMEPRAZOLE 20 MG PO CPDR: 20.0000 mg | DELAYED_RELEASE_CAPSULE | Freq: Every day | ORAL | Status: DC

## 2014-12-05 MED ORDER — LACTATED RINGERS IV SOLN
INTRAVENOUS | Status: DC
  Administered 2014-12-05: 10:00:00 via INTRAVENOUS
  Administered 2014-12-05: 1000 mL via INTRAVENOUS

## 2014-12-05 MED ORDER — PROPOFOL INFUSION 10 MG/ML OPTIME
INTRAVENOUS | Status: DC | PRN
  Administered 2014-12-05: 100 ug/kg/min via INTRAVENOUS

## 2014-12-05 SURGICAL SUPPLY — 15 items

## 2014-12-05 NOTE — Transfer of Care (Signed)
Immediate Anesthesia Transfer of Care Note  Patient: Jeff [name]  Procedure(s) Performed: Procedure(s): ESOPHAGOGASTRODUODENOSCOPY (EGD) WITH PROPOFOL (N/A)  Patient Location: PACU  Anesthesia Type:MAC  Level of Consciousness:  sedated, patient cooperative and responds to stimulation  Airway & Oxygen Therapy:Patient Spontanous Breathing and Patient connected to face mask oxgen  Post-op Assessment:  Report given to PACU RN and Post -op Vital signs reviewed and stable  Post vital signs:  Reviewed and stable  Last Vitals:  Filed Vitals:   12/05/14 1046  BP: 120/60  Pulse: 65  Temp: 36.4 C  Resp: 14    Complications: No apparent anesthesia complications

## 2014-12-05 NOTE — Discharge Instructions (Signed)
YOU HAD AN ENDOSCOPIC PROCEDURE TODAY: Refer to the procedure report that was given to you for any specific questions about what was found during the examination.  If the procedure report does not answer your questions, please call your gastroenterologist to clarify. ° °YOU SHOULD EXPECT: Some feelings of bloating in the abdomen. Passage of more gas than usual.  Walking can help get rid of the air that was put into your GI tract during the procedure and reduce the bloating. If you had a lower endoscopy (such as a colonoscopy or flexible sigmoidoscopy) you may notice spotting of blood in your stool or on the toilet paper.  ° °DIET: Your first meal following the procedure should be a light meal and then it is ok to progress to your normal diet.  A half-sandwich or bowl of soup is an example of a good first meal.  Heavy or fried foods are harder to digest and may make you feel nasueas or bloated.  Drink plenty of fluids but you should avoid alcoholic beverages for 24 hours. ° °ACTIVITY: Your care partner should take you home directly after the procedure.  You should plan to take it easy, moving slowly for the rest of the day.  You can resume normal activity the day after the procedure however you should NOT DRIVE or use heavy machinery for 24 hours (because of the sedation medicines used during the test).   ° °SYMPTOMS TO REPORT IMMEDIATELY  °A gastroenterologist can be reached at any hour.  Please call your doctor's office for any of the following symptoms: ° °· Following lower endoscopy (colonoscopy, flexible sigmoidoscopy) ° Excessive amounts of blood in the stool ° Significant tenderness, worsening of abdominal pains ° Swelling of the abdomen that is new, acute ° Fever of 100° or higher °· Following upper endoscopy (EGD, EUS, ERCP) ° Vomiting of blood or coffee ground material ° New, significant abdominal pain ° New, significant chest pain or pain under the shoulder blades ° Painful or persistently difficult  swallowing ° New shortness of breath ° Black, tarry-looking stools ° °FOLLOW UP: °If any biopsies were taken you will be contacted by phone or by letter within the next 1-3 weeks.  Call your gastroenterologist if you have not heard about the biopsies in 3 weeks.  °Please also call your gastroenterologist's office with any specific questions about appointments or follow up tests. ° °Conscious Sedation, Adult, Care After °Refer to this sheet in the next few weeks. These instructions provide you with information on caring for yourself after your procedure. Your health care provider may also give you more specific instructions. Your treatment has been planned according to current medical practices, but problems sometimes occur. Call your health care provider if you have any problems or questions after your procedure. °WHAT TO EXPECT AFTER THE PROCEDURE  °After your procedure: °· You may feel sleepy, clumsy, and have poor balance for several hours. °· Vomiting may occur if you eat too soon after the procedure. °HOME CARE INSTRUCTIONS °· Do not participate in any activities where you could become injured for at least 24 hours. Do not: °¨ Drive. °¨ Swim. °¨ Ride a bicycle. °¨ Operate heavy machinery. °¨ Cook. °¨ Use power tools. °¨ Climb ladders. °¨ Work from a high place. °· Do not make important decisions or sign legal documents until you are improved. °· If you vomit, drink water, juice, or soup when you can drink without vomiting. Make sure you have little or no nausea before eating   solid foods. °· Only take over-the-counter or prescription medicines for pain, discomfort, or fever as directed by your health care provider. °· Make sure you and your family fully understand everything about the medicines given to you, including what side effects may occur. °· You should not drink alcohol, take sleeping pills, or take medicines that cause drowsiness for at least 24 hours. °· If you smoke, do not smoke without  supervision. °· If you are feeling better, you may resume normal activities 24 hours after you were sedated. °· Keep all appointments with your health care provider. °SEEK MEDICAL CARE IF: °· Your skin is pale or bluish in color. °· You continue to feel nauseous or vomit. °· Your pain is getting worse and is not helped by medicine. °· You have bleeding or swelling. °· You are still sleepy or feeling clumsy after 24 hours. °SEEK IMMEDIATE MEDICAL CARE IF: °· You develop a rash. °· You have difficulty breathing. °· You develop any type of allergic problem. °· You have a fever. °MAKE SURE YOU: °· Understand these instructions. °· Will watch your condition. °· Will get help right away if you are not doing well or get worse. °Document Released: 04/04/2013 Document Reviewed: 04/04/2013 °Exit[employer]® Patient Information ©2015 [employer]. This information is not intended to replace advice given to you by your health care provider. Make sure you discuss any questions you have with your health care provider. ° °

## 2014-12-05 NOTE — H&P (View-Only) (Signed)
  HPI :   Patient is a 48-year-old male known to Dr. Jacobs. He had a colonoscopy for evaluation of hematochezia January 2009. Exam was normal.  Patient is referred by PCP for evaluation of pain with swallowing. A couple of weeks ago patient suddenly developed chest pain associated with swallowing liquids. Interestingly, solids cause no discomfort. Temperature liquids irrelevant. The pain occurs immediately upon swallowing liquids and last for just a couple of seconds. No coughing. No shortness of breath. Patient has not had any recent aunt biotic snore steroid-induced. He has no dysphasia. Patient took Zantac for several days without any improvement.   Past Medical History  Diagnosis Date  . CAD (coronary artery disease)     Cath 2011.  LAD stent patent, distal LAD occlusion, D1 100%, OM branch 80 - 90%, SVG to diag patent, LIMA to LAD occluded, SVG to RCA occluded, SVG to OM was occluded.   No change from previous cath.  Managed medically.   . Diabetes mellitus type I     on insulin pump at  home  . Sleep apnea     s/p oral surgery  . Hyperlipidemia   . 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 Diabetic retinopathy     s/p vitrectomy and history of retinal surgery  . HTN (hypertension)   . Seizure disorder   . Seasonal allergic rhinitis   . Depression   . Cataract   . Hemorrhoid   . Myocardial infarction   . Anxiety     Family History  Problem Relation Age of Onset  . Coronary artery disease Father 50  . Diabetes Father     type II  . Hypertension Father   . Squamous cell carcinoma Brother   . Arthritis Sister    History  Substance Use Topics  . Smoking status: Never Smoker   . Smokeless tobacco: Never Used  . Alcohol Use: No   Current Outpatient Prescriptions  Medication Sig Dispense Refill  . ALPRAZolam (XANAX) 0.5 MG tablet Take 0.5 mg by mouth at bedtime as needed for anxiety.    . aspirin 81 MG tablet Take 81 mg by mouth  daily.     . carbamazepine (TEGRETOL XR) 200 MG 12 hr tablet Take 3 tablets (600 mg total) by mouth 2 (two) times daily. Brand is medically necessary. 180 tablet 11  . docusate sodium (COLACE) 100 MG capsule Take 100 mg by mouth daily.     . ezetimibe (ZETIA) 10 MG tablet Take 10 mg by mouth at bedtime.     . Insulin Human (INSULIN PUMP) 100 unit/ml SOLN Inject into the skin 3 (three) times daily before meals. Add 8-11 units per insulin pump 3 times daily before meals per bolus scale.    . isosorbide mononitrate (IMDUR) 60 MG 24 hr tablet Take 60 mg by mouth every morning.     . Lancets (ONETOUCH ULTRASOFT) lancets 1 each by Other route See admin instructions. Check blood sugar 4 times daily. Check blood sugar 6-7 times daily on days going to cardiac rehab.    . levETIRAcetam (KEPPRA) 500 MG tablet Take 250 mg every morning and 500 mg every evening. 45 tablet 11  . lisinopril (PRINIVIL,ZESTRIL) 5 MG tablet Take 5 mg by mouth daily.    . metoprolol tartrate (LOPRESSOR) 25 MG tablet take 1 tablet by mouth twice a day 60 tablet 1  . multivitamin (ONE-A-DAY MEN'S) TABS Take 1 tablet by mouth   daily.     . NITROSTAT 0.4 MG SL tablet place 1 tablet under the tongue if needed every 5 minutes for chest pain 25 tablet 5  . NOVOLOG 100 UNIT/ML injection Novolog Insulin Basal Rate as follows: 12 am .9 units, 6 am 1.9 units, 10 am 2.0 units, 12 pm 2.2 units    . ONE TOUCH ULTRA TEST test strip 1 each by Other route See admin instructions. Check blood sugar 4 times daily. Check blood sugar 6-7 times daily when going to cardiac rehab.    . rosuvastatin (CRESTOR) 40 MG tablet Take 40 mg by mouth at bedtime.     . sertraline (ZOLOFT) 100 MG tablet Take 100 mg by mouth at bedtime.     . vitamin B-12 (CYANOCOBALAMIN) 500 MCG tablet Take 500 mcg by mouth daily.     No current facility-administered medications for this visit.   Allergies  Allergen Reactions  . Latex Rash    Review of Systems: Positive for  anxiety, arthritis, vision changes, depression, fatigue and sleeping problems. All other systems reviewed and negative except where noted in HPI.   Physical Exam: BP 100/72 mmHg  Pulse 68  Ht 5' 11" (1.803 m)  Wt 233 lb 12.8 oz (106.051 kg)  BMI 32.62 kg/m2 Constitutional: Pleasant,well-developed, white male in no acute distress. HEENT: Asymmetry of face.  Conjunctivae are normal. No scleral icterus. No obvious candida Neck supple.  Cardiovascular: Normal rate, regular rhythm.  Pulmonary/chest: Effort normal and breath sounds normal. No wheezing, rales or rhonchi. Abdominal: Soft, nondistended, nontender. Bowel sounds active throughout. There are no masses palpable. No hepatomegaly. Extremities: no edema Lymphadenopathy: No cervical adenopathy noted. Neurological: Alert and oriented to person place and time. Skin: Skin is warm and dry. No rashes noted. Psychiatric: Normal mood and affect. Behavior is normal.   ASSESSMENT AND PLAN:  1. 48-year-old male with chest pain related to swallowing liquids (solids fine). Symptoms ongoing for 2 weeks, no relief with H2 blocker. Interesting case as pain is only associated with swallowing liquids. We discussed options such as watch and wait versus proceeding with upper endoscopy. Patient is interested in EGD. He will need to be done at the hospital (wears oxygen at night). The benefits, risks, and potential complications of EGD with possible biopsies were discussed with the patient and he agrees to proceed.   2. Sleep apnea, uses oxygen at night  3. Diabetes, has pump.   4. Neurofibromatosis.   Mark Perini, MD   

## 2014-12-05 NOTE — Interval H&P Note (Signed)
History and Physical Interval Note:  12/05/2014 10:11 AM  Jeff [name]  has presented today for surgery, with the diagnosis of Odynophaghia  The various methods of treatment have been discussed with the patient and family. After consideration of risks, benefits and other options for treatment, the patient has consented to  Procedure(s): ESOPHAGOGASTRODUODENOSCOPY (EGD) WITH PROPOFOL (N/A) as a surgical intervention .  The patient's history has been reviewed, patient examined, no change in status, stable for surgery.  I have reviewed the patient's chart and labs.  Questions were answered to the patient's satisfaction.     [name]

## 2014-12-05 NOTE — Op Note (Signed)
[facility] [address] [place], [address]   ENDOSCOPY PROCEDURE REPORT  PATIENT: Jeff [name], Jeff [name]  MR#: [id number] BIRTHDATE: 25-Aug-1965 , 48  yrs. old GENDER: male ENDOSCOPIST: [name], MD PROCEDURE DATE:  12/05/2014 PROCEDURE:  EGD w/ balloon dilation ASA CLASS:     Class III INDICATIONS:  recent odynophagia, chest pains when swallowing liquids and rarely solids.  Also minor dysphagia.  H2 blocker daily did not help.  No weight loss.[name] MEDICATIONS: Monitored anesthesia care TOPICAL ANESTHETIC: none  DESCRIPTION OF PROCEDURE: After the risks benefits and alternatives of the procedure were thoroughly explained, informed consent was obtained.  The Pentax Gastroscope [id number] endoscope was introduced through the mouth and advanced to the second portion of the duodenum , Without limitations.  The instrument was slowly withdrawn as the mucosa was fully examined.  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  thick Schatzki's type ring).  The lumen through the stricture was 10-46mm.  Standard adult gastroscope passed into stomach without resistance.  The examination was otherwise normal. I dilated the stricture using a CRE TTS balloon inflated to 16.59mm, held inflated for 1 minute.  There was the usual superficial mucosal tear and minor self limited bleeding following dilation. Retroflexed views revealed no abnormalities.     The scope was then withdrawn from the patient and the procedure completed. COMPLICATIONS: There were no immediate complications.  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  thick Schatzki's type ring).  The lumen through the stricture was 10-53mm.  Standard adult gastroscope passed into stomach without resistance.  The examination was otherwise normal. I dilated the stricture using a CRE TTS balloon inflated to 16.33mm, held inflated for 1  minute.  There was the usual superficial mucosal tear and minor self limited bleeding following dilation  RECOMMENDATIONS: Please start once daily OTC PPI (antiacid medicine) such as omeprazole 20mg .  This is best take 20-30 min before breakfast meal daily.  Please call my office in 4-5 weeks to report on your response to this dilation and PPI therapy.   eSigned:  [name], MD 12/05/2014 10:46 AM    CC: Rodrigo Ran, MD

## 2014-12-05 NOTE — Anesthesia Postprocedure Evaluation (Signed)
  Anesthesia Post-op Note  Patient: Jeff [name]  Procedure(s) Performed: Procedure(s) (LRB): ESOPHAGOGASTRODUODENOSCOPY (EGD) WITH PROPOFOL (N/A)  Patient Location: PACU  Anesthesia Type: MAC  Level of Consciousness: awake and alert   Airway and Oxygen Therapy: Patient Spontanous Breathing  Post-op Pain: mild  Post-op Assessment: Post-op Vital signs reviewed, Patient's Cardiovascular Status Stable, Respiratory Function Stable, Patent Airway and No signs of Nausea or vomiting  Last Vitals:  Filed Vitals:   12/05/14 1120  BP: 116/59  Pulse: 63  Temp:   Resp: 21    Post-op Vital Signs: stable   Complications: No apparent anesthesia complications

## 2014-12-06 ENCOUNTER — Encounter (HOSPITAL_COMMUNITY): Payer: Self-pay | Admitting: Gastroenterology

## 2014-12-10 ENCOUNTER — Ambulatory Visit (INDEPENDENT_AMBULATORY_CARE_PROVIDER_SITE_OTHER): Payer: Medicare Other | Admitting: Cardiovascular Disease

## 2014-12-10 ENCOUNTER — Encounter: Payer: Self-pay | Admitting: Cardiovascular Disease

## 2014-12-10 VITALS — BP 115/62 | HR 73 | Ht 71.0 in | Wt 234.0 lb

## 2014-12-10 DIAGNOSIS — I10 Essential (primary) hypertension: Secondary | ICD-10-CM

## 2014-12-10 DIAGNOSIS — E785 Hyperlipidemia, unspecified: Secondary | ICD-10-CM | POA: Diagnosis not present

## 2014-12-10 NOTE — Patient Instructions (Signed)
Medication Instructions:  Your physician recommends that you continue on your current medications as directed. Please refer to the Current Medication list given to you today.  Labwork: Please have your upcoming lab results faxed to Attn: Dr Excell Seltzer, [phone]  Testing/Procedures: No new orders.   Follow-Up: Your physician wants you to follow-up in: 6 MONTHS with Dr Excell Seltzer.  You will receive a reminder letter in the mail two months in advance. If you don't receive a letter, please call our office to schedule the follow-up appointment.   Any Other Special Instructions Will Be Listed Below (If Applicable).

## 2014-12-10 NOTE — Progress Notes (Signed)
Cardiology Office Note Date:  12/10/2014   ID:  Jeff [name], DOB 1966/06/10, MRN [id number]  PCP:  Jeff Kayser, MD  Cardiologist:  Jeff Bollman, MD    No chief complaint on file.    History of Present Illness: Jeff [name] is a 49 y.o. male who presents for follow-up evaluation.   The patient is followed for coronary artery disease with history of CABG in 2011 and early bypass graft failure. He was treated with multivessel PCI following CABG (Native LAD and SVG-diagonal graft were stented, all other grafts occluded). He presented in August 2014 with worsening chest discomfort and underwent cardiac catheterization. This showed continued patency of his stent sites and continued patency of his diagonal vein graft. There was a pattern of diffuse small vessel disease noted and medical therapy was continued. LV function has been normal.  The patient has developed chest pain with swallowing liquids. He has not had exertional symptoms. He was referred to Dr Jeff [name] and underwent EGD showing a stricture at the GE junction. This was dilated and a daily PPI was recommended. He reports some improvement since the procedure. He hasn't been able to do cardiac rehab because of a fracture in his right foot after he slipped and fell.   Past Medical History  Diagnosis Date  . CAD (coronary artery disease)     Cath 2011.  LAD stent patent, distal LAD occlusion, D1 100%, OM branch 80 - 90%, SVG to diag patent, LIMA to LAD occluded, SVG to RCA occluded, SVG to OM was occluded.   No change from previous cath.  Managed medically.   . Diabetes mellitus type I     on insulin pump at  home  . Sleep apnea     s/p oral surgery  . Hyperlipidemia   . 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 Diabetic retinopathy     s/p vitrectomy and history of retinal surgery  . HTN (hypertension)   . Seizure disorder   . Seasonal allergic rhinitis   . Depression   . Cataract   .  Hemorrhoid   . Myocardial infarction   . Anxiety   . History of oxygen administration     nocutural use only at 2 l/m nasally.  . Toe fracture, right     11-23-14 fracture right big toe-wearing a stability shoe    Past Surgical History  Procedure Laterality Date  . Coronary artery bypass graft      4 vessel 4/11  . Uvulopalatoplasty surgery    . Tonsillectomy    . Release of right transverse carpal ligament    . Right eye vitrectomy and detached retina repair  10/2007  . Laparoscopic appendectomy  11/24/2011  . Appendectomy    . Left heart catheterization with coronary/graft angiogram N/A 02/22/2013    Procedure: LEFT HEART CATHETERIZATION WITH [name];  Surgeon: Jeff Bollman, MD;  Location: [facility] CATH LAB;  Service: Cardiovascular;  Laterality: N/A;  . Cardiac catheterization    . Coronary angioplasty      9'11,  CABPG grafts had failed.  . Cataract extraction, bilateral    . Esophagogastroduodenoscopy (egd) with propofol N/A 12/05/2014    Procedure: ESOPHAGOGASTRODUODENOSCOPY (EGD) WITH PROPOFOL;  Surgeon: Jeff Fee, MD;  Location: [facility] ENDOSCOPY;  Service: Endoscopy;  Laterality: N/A;    Current Outpatient Prescriptions  Medication Sig Dispense Refill  . acetaminophen (TYLENOL) 500 MG tablet Take 1,000 mg by mouth every 6 (  six) hours as needed for mild pain.    [name] ALPRAZolam (XANAX) 0.5 MG tablet Take 0.5 mg by mouth at bedtime as needed for anxiety.    [name] aspirin 81 MG tablet Take 81 mg by mouth daily.     . carbamazepine (TEGRETOL XR) 200 MG 12 hr tablet Take 3 tablets (600 mg total) by mouth 2 (two) times daily. Brand is medically necessary. 180 tablet 11  . docusate sodium (COLACE) 100 MG capsule Take 100 mg by mouth daily.     [name] ezetimibe (ZETIA) 10 MG tablet Take 10 mg by mouth at bedtime.     . Insulin Human (INSULIN PUMP) 100 unit/ml SOLN Inject into the skin 3 (three) times daily before meals. Add 8-11 units per insulin pump 3 times daily before meals per  bolus scale.    . isosorbide mononitrate (IMDUR) 60 MG 24 hr tablet Take 60 mg by mouth every morning.     . levETIRAcetam (KEPPRA) 500 MG tablet Take 250 mg every morning and 500 mg every evening. 45 tablet 11  . lisinopril (PRINIVIL,ZESTRIL) 5 MG tablet Take 5 mg by mouth every morning.     . metoprolol tartrate (LOPRESSOR) 25 MG tablet take 1 tablet by mouth twice a day 60 tablet 1  . multivitamin (ONE-A-DAY MEN'S) TABS Take 1 tablet by mouth daily.     [name] NITROSTAT 0.4 MG SL tablet place 1 tablet under the tongue if needed every 5 minutes for chest pain 25 tablet 5  . NON FORMULARY Place 2 L into the nose at bedtime.    [name] NOVOLOG 100 UNIT/ML injection Novolog Insulin Basal Rate as follows: 12 am .9 units, 6 am 1.9 units, 10 am 2.0 units, 12 pm 2.2 units    . omeprazole (PRILOSEC) 20 MG capsule Take 1 capsule (20 mg total) by mouth daily before breakfast. 30 capsule 11  . rosuvastatin (CRESTOR) 40 MG tablet Take 40 mg by mouth at bedtime.     . sertraline (ZOLOFT) 100 MG tablet Take 100 mg by mouth at bedtime.     . vitamin B-12 (CYANOCOBALAMIN) 500 MCG tablet Take 500 mcg by mouth daily.     No current facility-administered medications for this visit.    Allergies:   Latex   Social History:  The patient  reports that he has never smoked. He has never used smokeless tobacco. He reports that he does not drink alcohol or use illicit drugs.   Family History:  The patient's family history includes Arthritis in his sister; Coronary artery disease (age of onset: 67) in his father; Diabetes in his father; Hypertension in his father; Squamous cell carcinoma in his brother.   ROS:  Please see the history of present illness.  Otherwise, review of systems is positive for chest pain, depression, excessive fatigue, snoring, and anxiety.  All other systems are reviewed and negative.   PHYSICAL EXAM: VS:  BP 115/62 mmHg  Pulse 73  Ht  (1.803 m)  Wt 234 lb (106.142 kg)  BMI 32.65 kg/m2 , BMI  Body mass index is 32.65 kg/(m^2). GEN: Well nourished, well developed, in no acute distress HEENT: normal Neck: no JVD, no masses. No carotid bruits Cardiac: RRR without murmur or gallop                Respiratory:  clear to auscultation bilaterally, normal work of breathing GI: soft, nontender, nondistended, + BS MS: no deformity or atrophy Ext: trace pretibial edema, pedal pulses 2+= bilaterally  Skin: warm and dry, no rash Neuro:  Strength and sensation are intact Psych: euthymic mood, full affect  EKG:  EKG is ordered today. The ekg ordered today shows NSR 73 bpm, concave up inferior ST elevation unchanged from previous tracings.   Recent Labs: 01/18/2014: Hemoglobin 14.7; Platelets 162 10/28/2014: ALT 35; BUN 10; Creatinine, Ser 0.74*; Potassium 4.5; Sodium 138   Lipid Panel     Component Value Date/Time   CHOL 138 06/03/2011 0640   TRIG 111 06/03/2011 0640   HDL 42 06/03/2011 0640   CHOLHDL 3.3 06/03/2011 0640   VLDL 22 06/03/2011 0640   LDLCALC 74 06/03/2011 0640      Wt Readings from Last 3 Encounters:  12/10/14 234 lb (106.142 kg)  12/05/14 235 lb (106.595 kg)  11/26/14 233 lb 12.8 oz (106.051 kg)    ASSESSMENT AND PLAN: 1.  CAD, native vessel and bypass graft disease: No angina at present. He is not exercising as much because of his right foot fracture, but he appears to be quite stable from a cardiac perspective. Will continue current medicines without changes. I will see him back in 6 months.  2. Essential hypertension: Blood pressure is well controlled on current medical therapy.  3. Hyperlipidemia: The patient takes Crestor and ezetimibe. He is followed by Dr. Waynard Edwards and his annual physical his next month.  Current medicines are reviewed with the patient today.  The patient does not have concerns regarding medicines.  Labs/ tests ordered today include:  No orders of the defined types were placed in this encounter.    Disposition:   FU 6  months  Signed, Jeff Bollman, MD  12/10/2014 4:08 PM    [facility] [address] [place], [place], [place]  [address] Phone: [phone]; Fax: [phone]

## 2014-12-16 ENCOUNTER — Encounter (HOSPITAL_COMMUNITY): Payer: Self-pay

## 2014-12-16 DIAGNOSIS — Z955 Presence of coronary angioplasty implant and graft: Secondary | ICD-10-CM | POA: Insufficient documentation

## 2014-12-16 DIAGNOSIS — Z48812 Encounter for surgical aftercare following surgery on the circulatory system: Secondary | ICD-10-CM | POA: Insufficient documentation

## 2014-12-16 DIAGNOSIS — I252 Old myocardial infarction: Secondary | ICD-10-CM | POA: Insufficient documentation

## 2014-12-18 ENCOUNTER — Encounter (HOSPITAL_COMMUNITY)
Admission: RE | Admit: 2014-12-18 | Discharge: 2014-12-18 | Disposition: A | Discharge status: Home / Self Care | Payer: Self-pay | Source: Ambulatory Visit | Attending: Cardiovascular Disease | Admitting: Cardiovascular Disease

## 2014-12-20 ENCOUNTER — Encounter (HOSPITAL_COMMUNITY): Payer: Self-pay

## 2014-12-23 ENCOUNTER — Encounter (HOSPITAL_COMMUNITY): Payer: Self-pay

## 2014-12-25 ENCOUNTER — Encounter (HOSPITAL_COMMUNITY)
Admission: RE | Admit: 2014-12-25 | Discharge: 2014-12-25 | Disposition: A | Discharge status: Home / Self Care | Payer: Self-pay | Source: Ambulatory Visit | Attending: Cardiovascular Disease | Admitting: Cardiovascular Disease

## 2014-12-26 ENCOUNTER — Telehealth: Payer: Self-pay | Admitting: Neurology

## 2014-12-26 ENCOUNTER — Encounter: Payer: Self-pay | Admitting: Cardiovascular Disease

## 2014-12-26 NOTE — Telephone Encounter (Signed)
I received blood work results from Dr. Waynard Edwards. The blood work was done on 12/26/2014. This reveals a carbamazepine level IX.4. BUN is 10, creatinine 0.9. Chemistry profile was unremarkable, and liver profile is unremarkable. White blood count is 5.3, hemoglobin of 14.7, hematocrit of 42.6, platelets of 216. TSH 1.28, hemoglobin A1c 6.9.

## 2014-12-27 ENCOUNTER — Encounter (HOSPITAL_COMMUNITY)
Admission: RE | Admit: 2014-12-27 | Discharge: 2014-12-27 | Disposition: A | Discharge status: Home / Self Care | Payer: Self-pay | Source: Ambulatory Visit | Attending: Cardiovascular Disease | Admitting: Cardiovascular Disease

## 2014-12-27 DIAGNOSIS — I252 Old myocardial infarction: Secondary | ICD-10-CM | POA: Insufficient documentation

## 2014-12-27 DIAGNOSIS — Z955 Presence of coronary angioplasty implant and graft: Secondary | ICD-10-CM | POA: Insufficient documentation

## 2014-12-27 DIAGNOSIS — Z48812 Encounter for surgical aftercare following surgery on the circulatory system: Secondary | ICD-10-CM | POA: Insufficient documentation

## 2014-12-31 ENCOUNTER — Telehealth: Payer: Self-pay | Admitting: Gastroenterology

## 2015-01-01 ENCOUNTER — Encounter (HOSPITAL_COMMUNITY)
Admission: RE | Admit: 2015-01-01 | Discharge: 2015-01-01 | Disposition: A | Discharge status: Home / Self Care | Payer: Self-pay | Source: Ambulatory Visit | Attending: Cardiovascular Disease | Admitting: Cardiovascular Disease

## 2015-01-01 NOTE — Telephone Encounter (Signed)
Spoke to patient on 12-31-2014 and explained when he originally scheduled the procedure when he was here on 11-26-2014 he made several changes on the date for the procedure. I never cancelled the procedure dated of 01-07-2015 but did cancel it on 7-5.   I called explained this. He did have his discharge instructions when he had his EGD on 12-05-2014. He read to me he was to have an office follow up in 5-6 weeks.  I  made him a follow up appt with Mike Gip PA for 01-22-2015.

## 2015-01-03 ENCOUNTER — Encounter (HOSPITAL_COMMUNITY)
Admission: RE | Admit: 2015-01-03 | Discharge: 2015-01-03 | Disposition: A | Discharge status: Home / Self Care | Payer: Self-pay | Source: Ambulatory Visit | Attending: Cardiovascular Disease | Admitting: Cardiovascular Disease

## 2015-01-06 ENCOUNTER — Encounter: Payer: 59 | Admitting: Gastroenterology

## 2015-01-06 ENCOUNTER — Encounter (HOSPITAL_COMMUNITY)
Admission: RE | Admit: 2015-01-06 | Discharge: 2015-01-06 | Disposition: A | Discharge status: Home / Self Care | Payer: Self-pay | Source: Ambulatory Visit | Attending: Cardiovascular Disease | Admitting: Cardiovascular Disease

## 2015-01-07 ENCOUNTER — Encounter: Payer: 59 | Admitting: Gastroenterology

## 2015-01-08 ENCOUNTER — Encounter (HOSPITAL_COMMUNITY)
Admission: RE | Admit: 2015-01-08 | Discharge: 2015-01-08 | Disposition: A | Discharge status: Home / Self Care | Payer: Self-pay | Source: Ambulatory Visit | Attending: Cardiovascular Disease | Admitting: Cardiovascular Disease

## 2015-01-10 ENCOUNTER — Encounter (HOSPITAL_COMMUNITY)
Admission: RE | Admit: 2015-01-10 | Discharge: 2015-01-10 | Disposition: A | Discharge status: Home / Self Care | Payer: Self-pay | Source: Ambulatory Visit | Attending: Cardiovascular Disease | Admitting: Cardiovascular Disease

## 2015-01-13 ENCOUNTER — Encounter (HOSPITAL_COMMUNITY)
Admission: RE | Admit: 2015-01-13 | Discharge: 2015-01-13 | Disposition: A | Discharge status: Home / Self Care | Payer: Self-pay | Source: Ambulatory Visit | Attending: Cardiovascular Disease | Admitting: Cardiovascular Disease

## 2015-01-15 ENCOUNTER — Encounter (HOSPITAL_COMMUNITY)
Admission: RE | Admit: 2015-01-15 | Discharge: 2015-01-15 | Disposition: A | Discharge status: Home / Self Care | Payer: Self-pay | Source: Ambulatory Visit | Attending: Cardiovascular Disease | Admitting: Cardiovascular Disease

## 2015-01-17 ENCOUNTER — Encounter (HOSPITAL_COMMUNITY)
Admission: RE | Admit: 2015-01-17 | Discharge: 2015-01-17 | Disposition: A | Discharge status: Home / Self Care | Payer: Self-pay | Source: Ambulatory Visit | Attending: Cardiovascular Disease | Admitting: Cardiovascular Disease

## 2015-01-20 ENCOUNTER — Encounter (HOSPITAL_COMMUNITY): Payer: Self-pay

## 2015-01-22 ENCOUNTER — Ambulatory Visit (INDEPENDENT_AMBULATORY_CARE_PROVIDER_SITE_OTHER): Payer: Medicare Other | Admitting: Physician Assistant

## 2015-01-22 ENCOUNTER — Encounter: Payer: Self-pay | Admitting: Physician Assistant

## 2015-01-22 ENCOUNTER — Encounter (HOSPITAL_COMMUNITY)
Admission: RE | Admit: 2015-01-22 | Discharge: 2015-01-22 | Disposition: A | Discharge status: Home / Self Care | Payer: Self-pay | Source: Ambulatory Visit | Attending: Cardiovascular Disease | Admitting: Cardiovascular Disease

## 2015-01-22 VITALS — BP 100/64 | HR 80 | Ht 70.25 in | Wt 237.4 lb

## 2015-01-22 DIAGNOSIS — K219 Gastro-esophageal reflux disease without esophagitis: Secondary | ICD-10-CM | POA: Diagnosis not present

## 2015-01-22 DIAGNOSIS — Z8719 Personal history of other diseases of the digestive system: Secondary | ICD-10-CM | POA: Diagnosis not present

## 2015-01-22 MED ORDER — OMEPRAZOLE 20 MG PO CPDR: 20.0000 mg | DELAYED_RELEASE_CAPSULE | Freq: Every day | ORAL | Status: DC

## 2015-01-22 NOTE — Progress Notes (Addendum)
Patient ID: GEE HABIG, male   DOB: [birth year], 49 y.o.   MRN: [id number]   Subjective:    Patient ID: Jeff [name], male    DOB: September 24, 1965, 49 y.o.   MRN: [id number]  HPI Jeff [name] is a pleasant 49 year old white male known to Dr. Christella Hartigan who comes in today for follow-up post recent EGD. This was done for complaints of chest pain and odynophagia particularly with liquids. Patient does have significant history of coronary artery disease is status post CABG and then 2 stents to his LAD 2 years later. He also has insulin-dependent diabetes mellitus peripheral neuropathy, hypertension, neurofibromatosis, and chronic nocturnal O2 use. Patient had EGD on 12/05/2014 which showed a focal benign stricture at the GE junction which measured tendo 11 mm this was balloon dilated to 16.5 mm. He was started on omeprazole 20 mg by mouth daily and says he has been taking this regularly. He is not having any further problems with chest discomfort or odynophagia precipitated by liquids. He has no complaints of dysphagia at all. He is concerned because he had had some difficulty differentiating his cardiac chest pain from this most recent pain. He does relate that generally when he had anginal symptoms he would have some diaphoresis. He has not had to use any nitroglycerin recently area and he does feel the omeprazole is working well in controlling his symptoms without breakthrough.  Review of Systems Pertinent positive and negative review of systems were noted in the above HPI section.  All other review of systems was otherwise negative.  Outpatient Encounter Prescriptions as of 01/22/2015  Medication Sig  . acetaminophen (TYLENOL) 500 MG tablet Take 1,000 mg by mouth every 6 (six) hours as needed for mild pain.  [name] ALPRAZolam (XANAX) 0.5 MG tablet Take 0.5 mg by mouth at bedtime as needed for anxiety.  [name] aspirin 81 MG tablet Take 81 mg by mouth daily.   . carbamazepine (TEGRETOL XR) 200 MG 12 hr tablet Take 3  tablets (600 mg total) by mouth 2 (two) times daily. Brand is medically necessary.  . docusate sodium (COLACE) 100 MG capsule Take 100 mg by mouth daily.   [name] ezetimibe (ZETIA) 10 MG tablet Take 10 mg by mouth at bedtime.   . Insulin Human (INSULIN PUMP) 100 unit/ml SOLN Inject into the skin 3 (three) times daily before meals. Add 8-11 units per insulin pump 3 times daily before meals per bolus scale.  . isosorbide mononitrate (IMDUR) 60 MG 24 hr tablet Take 60 mg by mouth every morning.   . levETIRAcetam (KEPPRA) 500 MG tablet Take 250 mg every morning and 500 mg every evening.  [name] lisinopril (PRINIVIL,ZESTRIL) 5 MG tablet Take 5 mg by mouth every morning.   . metoprolol tartrate (LOPRESSOR) 25 MG tablet take 1 tablet by mouth twice a day  . multivitamin (ONE-A-DAY MEN'S) TABS Take 1 tablet by mouth daily.   [name] NITROSTAT 0.4 MG SL tablet place 1 tablet under the tongue if needed every 5 minutes for chest pain  . NOVOLOG 100 UNIT/ML injection Novolog Insulin Basal Rate as follows: 12 am .9 units, 6 am 1.9 units, 10 am 2.0 units, 12 pm 2.2 units  . omeprazole (PRILOSEC) 20 MG capsule Take 1 capsule (20 mg total) by mouth daily before breakfast.  . OXYGEN Place 2 L into the nose at bedtime.  . rosuvastatin (CRESTOR) 40 MG tablet Take 40 mg by mouth at bedtime.   . sertraline (ZOLOFT) 100 MG tablet Take 150 mg  by mouth at bedtime.   . vitamin B-12 (CYANOCOBALAMIN) 500 MCG tablet Take 500 mcg by mouth daily.  . [DISCONTINUED] omeprazole (PRILOSEC) 20 MG capsule Take 1 capsule (20 mg total) by mouth daily before breakfast.   No facility-administered encounter medications on file as of 01/22/2015.   Allergies  Allergen Reactions  . Latex Rash   Patient Active Problem List   Diagnosis Date Noted  . Odynophagia 11/28/2014  . DM type 2 causing eye disease 03/31/2014  . HTN (hypertension) 03/31/2014  . Partial epilepsy with impairment of consciousness 04/24/2013  . Neurofibromatosis, type 1 (von  Recklinghausen's disease) 04/24/2013  . Pain in limb 04/24/2013  . Encounter for therapeutic drug monitoring 04/24/2013  . Postop check 12/08/2011  . Leg pain, left 12/17/2010  . HYPERLIPIDEMIA-MIXED 11/28/2009  . ACUT MI SUBENDOCARDIAL INFARCT SUBSQT EPIS CARE 11/28/2009  . CORONARY ATHEROSLERO AUTOL VEIN BYPASS GRAFT 11/28/2009   History   Social History  . Marital Status: Married    Spouse Name: Almyra Free  . Number of Children: 0  . Years of Education: college   Occupational History  . Unemployed     previously worked in [employer]   Social History Main Topics  . Smoking status: Never Smoker   . Smokeless tobacco: Never Used  . Alcohol Use: No  . Drug Use: No  . Sexual Activity: Yes   Other Topics Concern  . Not on file   Social History Narrative   Patient lives with his wifeLanora Manis).   Patient does not have any children.   Patient is on disability.   Patient is left-handed (writes), but right hand with sports.   Patient has a college education.   Patient drinks 5-6 cups of caffeine daily.    Jeff [name] family history includes Arthritis in his sister; Coronary artery disease (age of onset: 97) in his father; Diabetes in his father; Hypertension in his father; Squamous cell carcinoma in his brother.      Objective:    Filed Vitals:   01/22/15 1434  BP: 100/64  Pulse: 80    Physical Exam     well-developed white male in no acute distress, pleasant blood pressure 100/64 pulse 89 height 5 foot 10 weight 237. HEENT; nontraumatic normocephalic EOMI PERRLA sclera anicteric, Supple; no JVD, Cardiovascular; regular rate and rhythm with S1-S2 no murmur or gallop, Pulmonary; clear bilaterally, Abdomen ;soft nontender nondistended bowel sounds are active there is no palpable mass or hepatosplenomegaly, Rectal; exam not done, Extremities; no clubbing cyanosis or edema skin warm and dry, Neuropsych; mood and affect appropriate       Assessment & Plan:  #1 49 yo male with  chronic GERD and distal esophageal stricture - symptomatic previosly with chest discomfort and odynophagia to liquids- asymptomatic at present  with PPI and dilation. #2 CAD #3 chronic nighttime 02 use   #4 IDDM #5 colon screening- negative Colonoscopy 2009  Plan; Antireflux regimen- reading materials given  Continue Prilosec 20 mg po qam  Follow up Dr Christella Hartigan in one year or sooner prn for recurrent sxs.   Amy S Esterwood PA-C 01/22/2015   Cc: Rodrigo Ran, MD    I agree with the above note, plan.

## 2015-01-22 NOTE — Patient Instructions (Signed)
Continue the Omeprazole 20 mg, 1 capsule in the morning.  We have given you antireflux information.

## 2015-01-23 ENCOUNTER — Other Ambulatory Visit: Payer: Self-pay | Admitting: Cardiovascular Disease

## 2015-01-24 ENCOUNTER — Encounter (HOSPITAL_COMMUNITY)
Admission: RE | Admit: 2015-01-24 | Discharge: 2015-01-24 | Disposition: A | Discharge status: Home / Self Care | Payer: Self-pay | Source: Ambulatory Visit | Attending: Cardiovascular Disease | Admitting: Cardiovascular Disease

## 2015-01-27 ENCOUNTER — Encounter (HOSPITAL_COMMUNITY)
Admission: RE | Admit: 2015-01-27 | Discharge: 2015-01-27 | Disposition: A | Discharge status: Home / Self Care | Payer: Self-pay | Source: Ambulatory Visit | Attending: Cardiovascular Disease | Admitting: Cardiovascular Disease

## 2015-01-27 DIAGNOSIS — Z955 Presence of coronary angioplasty implant and graft: Secondary | ICD-10-CM | POA: Insufficient documentation

## 2015-01-27 DIAGNOSIS — I252 Old myocardial infarction: Secondary | ICD-10-CM | POA: Insufficient documentation

## 2015-01-27 DIAGNOSIS — Z48812 Encounter for surgical aftercare following surgery on the circulatory system: Secondary | ICD-10-CM | POA: Insufficient documentation

## 2015-01-29 ENCOUNTER — Encounter (HOSPITAL_COMMUNITY)
Admission: RE | Admit: 2015-01-29 | Discharge: 2015-01-29 | Disposition: A | Discharge status: Home / Self Care | Payer: Self-pay | Source: Ambulatory Visit | Attending: Cardiovascular Disease | Admitting: Cardiovascular Disease

## 2015-01-31 ENCOUNTER — Encounter (HOSPITAL_COMMUNITY)
Admission: RE | Admit: 2015-01-31 | Discharge: 2015-01-31 | Disposition: A | Discharge status: Home / Self Care | Payer: Self-pay | Source: Ambulatory Visit | Attending: Cardiovascular Disease | Admitting: Cardiovascular Disease

## 2015-02-03 ENCOUNTER — Encounter (HOSPITAL_COMMUNITY)
Admission: RE | Admit: 2015-02-03 | Discharge: 2015-02-03 | Disposition: A | Discharge status: Home / Self Care | Payer: Self-pay | Source: Ambulatory Visit | Attending: Cardiovascular Disease | Admitting: Cardiovascular Disease

## 2015-02-05 ENCOUNTER — Encounter (HOSPITAL_COMMUNITY)
Admission: RE | Admit: 2015-02-05 | Discharge: 2015-02-05 | Disposition: A | Discharge status: Home / Self Care | Payer: Self-pay | Source: Ambulatory Visit | Attending: Cardiovascular Disease | Admitting: Cardiovascular Disease

## 2015-02-07 ENCOUNTER — Encounter (HOSPITAL_COMMUNITY)
Admission: RE | Admit: 2015-02-07 | Discharge: 2015-02-07 | Disposition: A | Discharge status: Home / Self Care | Payer: Self-pay | Source: Ambulatory Visit | Attending: Cardiovascular Disease | Admitting: Cardiovascular Disease

## 2015-02-10 ENCOUNTER — Encounter (HOSPITAL_COMMUNITY)
Admission: RE | Admit: 2015-02-10 | Discharge: 2015-02-10 | Disposition: A | Discharge status: Home / Self Care | Payer: Self-pay | Source: Ambulatory Visit | Attending: Cardiovascular Disease | Admitting: Cardiovascular Disease

## 2015-02-12 ENCOUNTER — Encounter (HOSPITAL_COMMUNITY)
Admission: RE | Admit: 2015-02-12 | Discharge: 2015-02-12 | Disposition: A | Discharge status: Home / Self Care | Payer: Self-pay | Source: Ambulatory Visit | Attending: Cardiovascular Disease | Admitting: Cardiovascular Disease

## 2015-02-14 ENCOUNTER — Encounter (HOSPITAL_COMMUNITY)
Admission: RE | Admit: 2015-02-14 | Discharge: 2015-02-14 | Disposition: A | Discharge status: Home / Self Care | Payer: Self-pay | Source: Ambulatory Visit | Attending: Cardiovascular Disease | Admitting: Cardiovascular Disease

## 2015-02-17 ENCOUNTER — Encounter (HOSPITAL_COMMUNITY)
Admission: RE | Admit: 2015-02-17 | Discharge: 2015-02-17 | Disposition: A | Discharge status: Home / Self Care | Payer: Self-pay | Source: Ambulatory Visit | Attending: Cardiovascular Disease | Admitting: Cardiovascular Disease

## 2015-02-19 ENCOUNTER — Encounter (HOSPITAL_COMMUNITY)
Admission: RE | Admit: 2015-02-19 | Discharge: 2015-02-19 | Disposition: A | Discharge status: Home / Self Care | Payer: Self-pay | Source: Ambulatory Visit | Attending: Cardiovascular Disease | Admitting: Cardiovascular Disease

## 2015-02-21 ENCOUNTER — Encounter (HOSPITAL_COMMUNITY)
Admission: RE | Admit: 2015-02-21 | Discharge: 2015-02-21 | Disposition: A | Discharge status: Home / Self Care | Payer: Self-pay | Source: Ambulatory Visit | Attending: Cardiovascular Disease | Admitting: Cardiovascular Disease

## 2015-02-24 ENCOUNTER — Encounter (HOSPITAL_COMMUNITY)
Admission: RE | Admit: 2015-02-24 | Discharge: 2015-02-24 | Disposition: A | Discharge status: Home / Self Care | Payer: Self-pay | Source: Ambulatory Visit | Attending: Cardiovascular Disease | Admitting: Cardiovascular Disease

## 2015-02-26 ENCOUNTER — Encounter (HOSPITAL_COMMUNITY)
Admission: RE | Admit: 2015-02-26 | Discharge: 2015-02-26 | Disposition: A | Discharge status: Home / Self Care | Payer: Self-pay | Source: Ambulatory Visit | Attending: Cardiovascular Disease | Admitting: Cardiovascular Disease

## 2015-02-28 ENCOUNTER — Encounter (HOSPITAL_COMMUNITY): Payer: Medicare Other

## 2015-02-28 DIAGNOSIS — Z48812 Encounter for surgical aftercare following surgery on the circulatory system: Secondary | ICD-10-CM | POA: Insufficient documentation

## 2015-02-28 DIAGNOSIS — I252 Old myocardial infarction: Secondary | ICD-10-CM | POA: Insufficient documentation

## 2015-02-28 DIAGNOSIS — Z955 Presence of coronary angioplasty implant and graft: Secondary | ICD-10-CM | POA: Insufficient documentation

## 2015-03-05 ENCOUNTER — Encounter (HOSPITAL_COMMUNITY)
Admission: RE | Admit: 2015-03-05 | Discharge: 2015-03-05 | Disposition: A | Discharge status: Home / Self Care | Payer: Self-pay | Source: Ambulatory Visit | Attending: Cardiovascular Disease | Admitting: Cardiovascular Disease

## 2015-03-07 ENCOUNTER — Encounter (HOSPITAL_COMMUNITY)
Admission: RE | Admit: 2015-03-07 | Discharge: 2015-03-07 | Disposition: A | Discharge status: Home / Self Care | Payer: Self-pay | Source: Ambulatory Visit | Attending: Cardiovascular Disease | Admitting: Cardiovascular Disease

## 2015-03-10 ENCOUNTER — Encounter (HOSPITAL_COMMUNITY)
Admission: RE | Admit: 2015-03-10 | Discharge: 2015-03-10 | Disposition: A | Discharge status: Home / Self Care | Payer: Self-pay | Source: Ambulatory Visit | Attending: Cardiovascular Disease | Admitting: Cardiovascular Disease

## 2015-03-12 ENCOUNTER — Encounter (HOSPITAL_COMMUNITY)
Admission: RE | Admit: 2015-03-12 | Discharge: 2015-03-12 | Disposition: A | Discharge status: Home / Self Care | Payer: Self-pay | Source: Ambulatory Visit | Attending: Cardiovascular Disease | Admitting: Cardiovascular Disease

## 2015-03-14 ENCOUNTER — Encounter (HOSPITAL_COMMUNITY)
Admission: RE | Admit: 2015-03-14 | Discharge: 2015-03-14 | Disposition: A | Discharge status: Home / Self Care | Payer: Self-pay | Source: Ambulatory Visit | Attending: Cardiovascular Disease | Admitting: Cardiovascular Disease

## 2015-03-17 ENCOUNTER — Encounter (HOSPITAL_COMMUNITY)
Admission: RE | Admit: 2015-03-17 | Discharge: 2015-03-17 | Disposition: A | Discharge status: Home / Self Care | Payer: Self-pay | Source: Ambulatory Visit | Attending: Cardiovascular Disease | Admitting: Cardiovascular Disease

## 2015-03-19 ENCOUNTER — Encounter (HOSPITAL_COMMUNITY)
Admission: RE | Admit: 2015-03-19 | Discharge: 2015-03-19 | Disposition: A | Discharge status: Home / Self Care | Payer: Self-pay | Source: Ambulatory Visit | Attending: Cardiovascular Disease | Admitting: Cardiovascular Disease

## 2015-03-21 ENCOUNTER — Encounter (HOSPITAL_COMMUNITY)
Admission: RE | Admit: 2015-03-21 | Discharge: 2015-03-21 | Disposition: A | Discharge status: Home / Self Care | Payer: Self-pay | Source: Ambulatory Visit | Attending: Cardiovascular Disease | Admitting: Cardiovascular Disease

## 2015-03-24 ENCOUNTER — Encounter (HOSPITAL_COMMUNITY)
Admission: RE | Admit: 2015-03-24 | Discharge: 2015-03-24 | Disposition: A | Discharge status: Home / Self Care | Payer: Self-pay | Source: Ambulatory Visit | Attending: Cardiovascular Disease | Admitting: Cardiovascular Disease

## 2015-03-26 ENCOUNTER — Encounter (HOSPITAL_COMMUNITY)
Admission: RE | Admit: 2015-03-26 | Discharge: 2015-03-26 | Disposition: A | Discharge status: Home / Self Care | Payer: Self-pay | Source: Ambulatory Visit | Attending: Cardiovascular Disease | Admitting: Cardiovascular Disease

## 2015-03-28 ENCOUNTER — Encounter (HOSPITAL_COMMUNITY)
Admission: RE | Admit: 2015-03-28 | Discharge: 2015-03-28 | Disposition: A | Discharge status: Home / Self Care | Payer: Self-pay | Source: Ambulatory Visit | Attending: Cardiovascular Disease | Admitting: Cardiovascular Disease

## 2015-03-31 ENCOUNTER — Encounter (HOSPITAL_COMMUNITY)
Admission: RE | Admit: 2015-03-31 | Discharge: 2015-03-31 | Disposition: A | Discharge status: Home / Self Care | Payer: Self-pay | Source: Ambulatory Visit | Attending: Cardiovascular Disease | Admitting: Cardiovascular Disease

## 2015-03-31 DIAGNOSIS — Z955 Presence of coronary angioplasty implant and graft: Secondary | ICD-10-CM | POA: Insufficient documentation

## 2015-03-31 DIAGNOSIS — Z48812 Encounter for surgical aftercare following surgery on the circulatory system: Secondary | ICD-10-CM | POA: Insufficient documentation

## 2015-03-31 DIAGNOSIS — I252 Old myocardial infarction: Secondary | ICD-10-CM | POA: Insufficient documentation

## 2015-04-02 ENCOUNTER — Encounter (HOSPITAL_COMMUNITY)
Admission: RE | Admit: 2015-04-02 | Discharge: 2015-04-02 | Disposition: A | Discharge status: Home / Self Care | Payer: Self-pay | Source: Ambulatory Visit | Attending: Cardiovascular Disease | Admitting: Cardiovascular Disease

## 2015-04-04 ENCOUNTER — Encounter (HOSPITAL_COMMUNITY)
Admission: RE | Admit: 2015-04-04 | Discharge: 2015-04-04 | Disposition: A | Discharge status: Home / Self Care | Payer: Self-pay | Source: Ambulatory Visit | Attending: Cardiovascular Disease | Admitting: Cardiovascular Disease

## 2015-04-07 ENCOUNTER — Encounter (HOSPITAL_COMMUNITY)
Admission: RE | Admit: 2015-04-07 | Discharge: 2015-04-07 | Disposition: A | Discharge status: Home / Self Care | Payer: Self-pay | Source: Ambulatory Visit | Attending: Cardiovascular Disease | Admitting: Cardiovascular Disease

## 2015-04-09 ENCOUNTER — Encounter (HOSPITAL_COMMUNITY)
Admission: RE | Admit: 2015-04-09 | Discharge: 2015-04-09 | Disposition: A | Discharge status: Home / Self Care | Payer: Self-pay | Source: Ambulatory Visit | Attending: Cardiovascular Disease | Admitting: Cardiovascular Disease

## 2015-04-11 ENCOUNTER — Encounter (HOSPITAL_COMMUNITY)
Admission: RE | Admit: 2015-04-11 | Discharge: 2015-04-11 | Disposition: A | Discharge status: Home / Self Care | Payer: Self-pay | Source: Ambulatory Visit | Attending: Cardiovascular Disease | Admitting: Cardiovascular Disease

## 2015-04-14 ENCOUNTER — Encounter (HOSPITAL_COMMUNITY): Payer: Self-pay

## 2015-04-16 ENCOUNTER — Encounter (HOSPITAL_COMMUNITY)
Admission: RE | Admit: 2015-04-16 | Discharge: 2015-04-16 | Disposition: A | Discharge status: Home / Self Care | Payer: Self-pay | Source: Ambulatory Visit | Attending: Cardiovascular Disease | Admitting: Cardiovascular Disease

## 2015-04-18 ENCOUNTER — Encounter (HOSPITAL_COMMUNITY)
Admission: RE | Admit: 2015-04-18 | Discharge: 2015-04-18 | Disposition: A | Discharge status: Home / Self Care | Payer: Self-pay | Source: Ambulatory Visit | Attending: Cardiovascular Disease | Admitting: Cardiovascular Disease

## 2015-04-21 ENCOUNTER — Encounter (HOSPITAL_COMMUNITY)
Admission: RE | Admit: 2015-04-21 | Discharge: 2015-04-21 | Disposition: A | Discharge status: Home / Self Care | Payer: Self-pay | Source: Ambulatory Visit | Attending: Cardiovascular Disease | Admitting: Cardiovascular Disease

## 2015-04-23 ENCOUNTER — Encounter (HOSPITAL_COMMUNITY): Payer: Self-pay

## 2015-04-25 ENCOUNTER — Encounter (HOSPITAL_COMMUNITY)
Admission: RE | Admit: 2015-04-25 | Discharge: 2015-04-25 | Disposition: A | Discharge status: Home / Self Care | Payer: Self-pay | Source: Ambulatory Visit | Attending: Cardiovascular Disease | Admitting: Cardiovascular Disease

## 2015-04-28 ENCOUNTER — Encounter (HOSPITAL_COMMUNITY)
Admission: RE | Admit: 2015-04-28 | Discharge: 2015-04-28 | Disposition: A | Discharge status: Home / Self Care | Payer: Self-pay | Source: Ambulatory Visit | Attending: Cardiovascular Disease | Admitting: Cardiovascular Disease

## 2015-04-30 ENCOUNTER — Encounter (HOSPITAL_COMMUNITY)
Admission: RE | Admit: 2015-04-30 | Discharge: 2015-04-30 | Disposition: A | Discharge status: Home / Self Care | Payer: Self-pay | Source: Ambulatory Visit | Attending: Cardiovascular Disease | Admitting: Cardiovascular Disease

## 2015-04-30 DIAGNOSIS — Z955 Presence of coronary angioplasty implant and graft: Secondary | ICD-10-CM | POA: Insufficient documentation

## 2015-04-30 DIAGNOSIS — I252 Old myocardial infarction: Secondary | ICD-10-CM | POA: Insufficient documentation

## 2015-04-30 DIAGNOSIS — Z48812 Encounter for surgical aftercare following surgery on the circulatory system: Secondary | ICD-10-CM | POA: Insufficient documentation

## 2015-05-02 ENCOUNTER — Encounter (HOSPITAL_COMMUNITY)
Admission: RE | Admit: 2015-05-02 | Discharge: 2015-05-02 | Disposition: A | Discharge status: Home / Self Care | Payer: Self-pay | Source: Ambulatory Visit | Attending: Cardiovascular Disease | Admitting: Cardiovascular Disease

## 2015-05-05 ENCOUNTER — Encounter (HOSPITAL_COMMUNITY)
Admission: RE | Admit: 2015-05-05 | Discharge: 2015-05-05 | Disposition: A | Discharge status: Home / Self Care | Payer: Self-pay | Source: Ambulatory Visit | Attending: Cardiovascular Disease | Admitting: Cardiovascular Disease

## 2015-05-07 ENCOUNTER — Encounter (HOSPITAL_COMMUNITY)
Admission: RE | Admit: 2015-05-07 | Discharge: 2015-05-07 | Disposition: A | Discharge status: Home / Self Care | Payer: Self-pay | Source: Ambulatory Visit | Attending: Cardiovascular Disease | Admitting: Cardiovascular Disease

## 2015-05-09 ENCOUNTER — Encounter (HOSPITAL_COMMUNITY)
Admission: RE | Admit: 2015-05-09 | Discharge: 2015-05-09 | Disposition: A | Discharge status: Home / Self Care | Payer: Self-pay | Source: Ambulatory Visit | Attending: Cardiovascular Disease | Admitting: Cardiovascular Disease

## 2015-05-12 ENCOUNTER — Encounter (HOSPITAL_COMMUNITY)
Admission: RE | Admit: 2015-05-12 | Discharge: 2015-05-12 | Disposition: A | Discharge status: Home / Self Care | Payer: Self-pay | Source: Ambulatory Visit | Attending: Cardiovascular Disease | Admitting: Cardiovascular Disease

## 2015-05-14 ENCOUNTER — Encounter (HOSPITAL_COMMUNITY)
Admission: RE | Admit: 2015-05-14 | Discharge: 2015-05-14 | Disposition: A | Discharge status: Home / Self Care | Payer: Self-pay | Source: Ambulatory Visit | Attending: Cardiovascular Disease | Admitting: Cardiovascular Disease

## 2015-05-16 ENCOUNTER — Encounter (HOSPITAL_COMMUNITY)
Admission: RE | Admit: 2015-05-16 | Discharge: 2015-05-16 | Disposition: A | Discharge status: Home / Self Care | Payer: Self-pay | Source: Ambulatory Visit | Attending: Cardiovascular Disease | Admitting: Cardiovascular Disease

## 2015-05-19 ENCOUNTER — Encounter (HOSPITAL_COMMUNITY)
Admission: RE | Admit: 2015-05-19 | Discharge: 2015-05-19 | Disposition: A | Discharge status: Home / Self Care | Payer: Self-pay | Source: Ambulatory Visit | Attending: Cardiovascular Disease | Admitting: Cardiovascular Disease

## 2015-05-21 ENCOUNTER — Encounter (HOSPITAL_COMMUNITY)
Admission: RE | Admit: 2015-05-21 | Discharge: 2015-05-21 | Disposition: A | Discharge status: Home / Self Care | Payer: Self-pay | Source: Ambulatory Visit | Attending: Cardiovascular Disease | Admitting: Cardiovascular Disease

## 2015-05-26 ENCOUNTER — Encounter (HOSPITAL_COMMUNITY)
Admission: RE | Admit: 2015-05-26 | Discharge: 2015-05-26 | Disposition: A | Discharge status: Home / Self Care | Payer: Self-pay | Source: Ambulatory Visit | Attending: Cardiovascular Disease | Admitting: Cardiovascular Disease

## 2015-05-27 ENCOUNTER — Telehealth: Payer: Self-pay | Admitting: Cardiovascular Disease

## 2015-05-27 NOTE — Telephone Encounter (Signed)
NeW Message  Per recall pt is due to see Dr Excell Seltzer after 06/08/15- next avail is March/2017- pt wanted to be sure that it was okay to wait this long and if not needed to speak w/ RN about sched appt. Please call back and discuss.

## 2015-05-27 NOTE — Telephone Encounter (Signed)
I spoke with the pt and appt scheduled on 06/13/15 with Dr Excell Seltzer.

## 2015-05-28 ENCOUNTER — Encounter (HOSPITAL_COMMUNITY)
Admission: RE | Admit: 2015-05-28 | Discharge: 2015-05-28 | Disposition: A | Discharge status: Home / Self Care | Payer: Self-pay | Source: Ambulatory Visit | Attending: Cardiovascular Disease | Admitting: Cardiovascular Disease

## 2015-05-30 ENCOUNTER — Encounter (HOSPITAL_COMMUNITY)
Admission: RE | Admit: 2015-05-30 | Discharge: 2015-05-30 | Disposition: A | Discharge status: Home / Self Care | Payer: Self-pay | Source: Ambulatory Visit | Attending: Cardiovascular Disease | Admitting: Cardiovascular Disease

## 2015-05-30 DIAGNOSIS — Z955 Presence of coronary angioplasty implant and graft: Secondary | ICD-10-CM | POA: Insufficient documentation

## 2015-05-30 DIAGNOSIS — I252 Old myocardial infarction: Secondary | ICD-10-CM | POA: Insufficient documentation

## 2015-06-02 ENCOUNTER — Encounter (HOSPITAL_COMMUNITY): Payer: Self-pay

## 2015-06-03 ENCOUNTER — Encounter (HOSPITAL_COMMUNITY)
Admission: RE | Admit: 2015-06-03 | Discharge: 2015-06-03 | Disposition: A | Discharge status: Home / Self Care | Payer: Self-pay | Source: Ambulatory Visit | Attending: Cardiovascular Disease | Admitting: Cardiovascular Disease

## 2015-06-04 ENCOUNTER — Encounter (HOSPITAL_COMMUNITY)
Admission: RE | Admit: 2015-06-04 | Discharge: 2015-06-04 | Disposition: A | Discharge status: Home / Self Care | Payer: Self-pay | Source: Ambulatory Visit | Attending: Cardiovascular Disease | Admitting: Cardiovascular Disease

## 2015-06-06 ENCOUNTER — Encounter (HOSPITAL_COMMUNITY): Payer: Self-pay

## 2015-06-09 ENCOUNTER — Encounter (HOSPITAL_COMMUNITY)
Admission: RE | Admit: 2015-06-09 | Discharge: 2015-06-09 | Disposition: A | Discharge status: Home / Self Care | Payer: Self-pay | Source: Ambulatory Visit | Attending: Cardiovascular Disease | Admitting: Cardiovascular Disease

## 2015-06-11 ENCOUNTER — Encounter (HOSPITAL_COMMUNITY)
Admission: RE | Admit: 2015-06-11 | Discharge: 2015-06-11 | Disposition: A | Discharge status: Home / Self Care | Payer: Self-pay | Source: Ambulatory Visit | Attending: Cardiovascular Disease | Admitting: Cardiovascular Disease

## 2015-06-13 ENCOUNTER — Encounter: Payer: Self-pay | Admitting: Cardiovascular Disease

## 2015-06-13 ENCOUNTER — Telehealth (HOSPITAL_COMMUNITY): Payer: Self-pay | Admitting: *Deleted

## 2015-06-13 ENCOUNTER — Ambulatory Visit (INDEPENDENT_AMBULATORY_CARE_PROVIDER_SITE_OTHER): Payer: Medicare Other | Admitting: Cardiovascular Disease

## 2015-06-13 ENCOUNTER — Encounter (HOSPITAL_COMMUNITY)
Admission: RE | Admit: 2015-06-13 | Discharge: 2015-06-13 | Disposition: A | Discharge status: Home / Self Care | Payer: Self-pay | Source: Ambulatory Visit | Attending: Cardiovascular Disease | Admitting: Cardiovascular Disease

## 2015-06-13 VITALS — BP 126/78 | HR 71 | Ht 71.0 in | Wt 236.8 lb

## 2015-06-13 DIAGNOSIS — I10 Essential (primary) hypertension: Secondary | ICD-10-CM | POA: Diagnosis not present

## 2015-06-13 DIAGNOSIS — I25719 Atherosclerosis of autologous vein coronary artery bypass graft(s) with unspecified angina pectoris: Secondary | ICD-10-CM

## 2015-06-13 MED ORDER — NITROGLYCERIN 0.4 MG SL SUBL: SUBLINGUAL_TABLET | SUBLINGUAL | Status: DC

## 2015-06-13 MED ORDER — METOPROLOL TARTRATE 25 MG PO TABS: 25.0000 mg | ORAL_TABLET | Freq: Two times a day (BID) | ORAL | Status: DC

## 2015-06-13 NOTE — Telephone Encounter (Signed)
Left message on voicemail in reference to upcoming appointment scheduled for 06/17/15. Phone number given for a call back so details instructions can be given. Daneil Dolin

## 2015-06-13 NOTE — Patient Instructions (Signed)
Medication Instructions:  Your physician recommends that you continue on your current medications as directed. Please refer to the Current Medication list given to you today.  Labwork: No new orders.   Testing/Procedures: Your physician has requested that you have a lexiscan myoview. For further information please visit [email or web address] Please follow instruction sheet, as given.  Follow-Up: Your physician wants you to follow-up in: 6 MONTHS with Dr Excell Seltzer.  You will receive a reminder letter in the mail two months in advance. If you don't receive a letter, please call our office to schedule the follow-up appointment.   Any Other Special Instructions Will Be Listed Below (If Applicable).     If you need a refill on your cardiac medications before your next appointment, please call your pharmacy.

## 2015-06-13 NOTE — Progress Notes (Signed)
Cardiology Office Note Date:  06/13/2015   ID:  BAUER MALINSKY, DOB Sep 24, 1965, MRN [id number]  PCP:  Ezequiel Kayser, MD  Cardiologist:  Tonny Bollman, MD    Chief Complaint  Patient presents with  . Fatigue     History of Present Illness: Jeff [name] is a 49 y.o. male who presents for follow-up evaluation.   The patient is followed for coronary artery disease with history of CABG in 2011 and early bypass graft failure. He was treated with multivessel PCI following CABG (Native LAD and SVG-diagonal graft were stented, all other grafts occluded). He presented in August 2014 with worsening chest discomfort and underwent cardiac catheterization. This showed continued patency of his stent sites and continued patency of his diagonal vein graft. There was a pattern of diffuse small vessel disease noted and medical therapy was continued. LV function has been normal. He has participated in the maintenance phase of cardiac rehab now for several years.   He complains of generalized fatigue. He has obstructive sleep apnea and wears oxygen at night. He hasn't tolerated CPAP. He's had chest pain on and off, feels a 'pressing' sensation in the left chest. Hasn't been having chest pain associated with exercise during cardiac rehab. No exacerbating or alleviating factors. Denies palpitations, edema, orthopnea, PND. No lightheadedness or syncope.      Past Medical History  Diagnosis Date  . CAD (coronary artery disease)     Cath 2011.  LAD stent patent, distal LAD occlusion, D1 100%, OM branch 80 - 90%, SVG to diag patent, LIMA to LAD occluded, SVG to RCA occluded, SVG to OM was occluded.   No change from previous cath.  Managed medically.   . Diabetes mellitus type I (HCC)     on insulin pump at  home  . Sleep apnea     s/p oral surgery  . Hyperlipidemia   . 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 Diabetic retinopathy     s/p vitrectomy and history of  retinal surgery  . HTN (hypertension)   . Seizure disorder (HCC)   . Seasonal allergic rhinitis   . Depression   . Cataract   . Hemorrhoid   . Myocardial infarction (HCC)   . Anxiety   . History of oxygen administration     nocutural use only at 2 l/m nasally.  . Toe fracture, right     11-23-14 fracture right big toe-wearing a stability shoe    Past Surgical History  Procedure Laterality Date  . Coronary artery bypass graft      4 vessel 4/11  . Uvulopalatoplasty surgery    . Tonsillectomy    . Release of right transverse carpal ligament    . Right eye vitrectomy and detached retina repair  10/2007  . Laparoscopic appendectomy  11/24/2011  . Appendectomy    . Left heart catheterization with coronary/graft angiogram N/A 02/22/2013    Procedure: LEFT HEART CATHETERIZATION WITH [name];  Surgeon: Tonny Bollman, MD;  Location: [facility] CATH LAB;  Service: Cardiovascular;  Laterality: N/A;  . Cardiac catheterization    . Coronary angioplasty      9'11,  CABPG grafts had failed.  . Cataract extraction, bilateral    . Esophagogastroduodenoscopy (egd) with propofol N/A 12/05/2014    Procedure: ESOPHAGOGASTRODUODENOSCOPY (EGD) WITH PROPOFOL;  Surgeon: [name], MD;  Location: [facility] ENDOSCOPY;  Service: Endoscopy;  Laterality: N/A;    Current Outpatient Prescriptions  Medication  Sig Dispense Refill  . acetaminophen (TYLENOL) 500 MG tablet Take 1,000 mg by mouth every 6 (six) hours as needed for mild pain.    [name] ALPRAZolam (XANAX) 0.5 MG tablet Take 0.5 mg by mouth at bedtime as needed for anxiety.    [name] aspirin 81 MG tablet Take 81 mg by mouth daily.     . carbamazepine (TEGRETOL XR) 200 MG 12 hr tablet Take 3 tablets (600 mg total) by mouth 2 (two) times daily. Brand is medically necessary. 180 tablet 11  . docusate sodium (COLACE) 100 MG capsule Take 100 mg by mouth daily.     [name] ezetimibe (ZETIA) 10 MG tablet Take 10 mg by mouth at bedtime.     . Insulin Human (INSULIN  PUMP) 100 unit/ml SOLN Inject into the skin 3 (three) times daily before meals. Add 8-11 units per insulin pump 3 times daily before meals per bolus scale.    . isosorbide mononitrate (IMDUR) 60 MG 24 hr tablet Take 60 mg by mouth every morning.     . levETIRAcetam (KEPPRA) 500 MG tablet Take 250 mg every morning and 500 mg every evening. 45 tablet 11  . lisinopril (PRINIVIL,ZESTRIL) 5 MG tablet Take 5 mg by mouth every morning.     . metoprolol tartrate (LOPRESSOR) 25 MG tablet take 1 tablet by mouth twice a day 60 tablet 5  . multivitamin (ONE-A-DAY MEN'S) TABS Take 1 tablet by mouth daily.     [name] NITROSTAT 0.4 MG SL tablet place 1 tablet under the tongue if needed every 5 minutes for chest pain 25 tablet 5  . NOVOLOG 100 UNIT/ML injection Novolog Insulin Basal Rate as follows: 12 am .9 units, 6 am 1.9 units, 10 am 2.0 units, 12 pm 2.2 units    . omeprazole (PRILOSEC) 20 MG capsule Take 1 capsule (20 mg total) by mouth daily before breakfast. 30 capsule 11  . OXYGEN Place 2 L into the nose at bedtime.    . rosuvastatin (CRESTOR) 40 MG tablet Take 40 mg by mouth at bedtime.     . sertraline (ZOLOFT) 100 MG tablet Take 150 mg by mouth at bedtime.     . vitamin B-12 (CYANOCOBALAMIN) 500 MCG tablet Take 500 mcg by mouth daily.     No current facility-administered medications for this visit.    Allergies:   Latex   Social History:  The patient  reports that he has never smoked. He has never used smokeless tobacco. He reports that he does not drink alcohol or use illicit drugs.   Family History:  The patient's  family history includes Arthritis in his sister; Coronary artery disease (age of onset: 60) in his father; Diabetes in his father; Hypertension in his father; Squamous cell carcinoma in his brother.    ROS:  Please see the history of present illness.  Otherwise, review of systems is positive for hearing loss, cough, depression, excessive fatigue, snoring, and anxiety. All other systems are  reviewed and negative.   PHYSICAL EXAM: VS:  BP 126/78 mmHg  Pulse 71  Ht  (1.803 m)  Wt 236 lb 12.8 oz (107.412 kg)  BMI 33.04 kg/m2 , BMI Body mass index is 33.04 kg/(m^2). GEN: Well nourished, well developed, in no acute distress HEENT: normal Neck: no JVD, no masses. No carotid bruits Cardiac: RRR without murmur or gallop                Respiratory:  clear to auscultation bilaterally, normal work of  breathing GI: soft, nontender, nondistended, + BS MS: no deformity or atrophy Ext: no pretibial edema, pedal pulses 2+= bilaterally Skin: warm and dry, no rash Neuro:  Strength and sensation are intact Psych: euthymic mood, full affect  EKG:  EKG is ordered today. The ekg ordered today shows NSR, HR 72 bpm, possible RVH, no change from previous tracings  Recent Labs: 10/28/2014: ALT 35; BUN 10; Creatinine, Ser 0.74*; Potassium 4.5; Sodium 138   Lipid Panel     Component Value Date/Time   CHOL 138 06/03/2011 0640   TRIG 111 06/03/2011 0640   HDL 42 06/03/2011 0640   CHOLHDL 3.3 06/03/2011 0640   VLDL 22 06/03/2011 0640   LDLCALC 74 06/03/2011 0640      Wt Readings from Last 3 Encounters:  06/13/15 236 lb 12.8 oz (107.412 kg)  01/22/15 237 lb 6 oz (107.673 kg)  12/10/14 234 lb (106.142 kg)     Cardiac Studies Reviewed: ETT 02/25/2012: Exercise Tolerance Test Ordering MD: Tonny Bollman, MD Interpreting MD: Tonny Bollman,  MD  Unique Test No: 1 Treadmill: 1  Indication for ETT: known ASHD Contraindication to ETT: No  Stress Modality: exercise - treadmill Cardiac Imaging Performed:  non  Protocol: standard Bruce - maximal Max BP: 210/73  Max MPHR (bpm): 172 85% MPR (bpm): 146  MPHR obtained (bpm): 146 % MPHR obtained: 85%  Reached 85% MPHR (min:sec): 7:20 Total Exercise Time (min-sec):  7:26  Workload in METS: 7.1 Borg Scale: 15  Reason ETT Terminated: dyspnea   ST Segment Analysis At Rest: normal ST segments - no evidence of significant  ST  depression With Exercise: borderline ST changes  Other Information Arrhythmia: No Angina during ETT: present (1) Quality of ETT: diagnostic  ETT Interpretation: abnormal - evidence of ST depression  consistent with ischemia  Comments: 1. Abnormal stress treadmill study with borderline ST depression,  hypertensive response to exercise, and exertional angina.  2. Fair exercise tolerance.  3. Rapid resolution of borderline ST changes with recovery  Recommendations: Abnormal but not high-risk treadmill test. Recommend increase  antianginal program and follow-up clinically in 3 months.  ASSESSMENT AND PLAN: 1.  CAD, native vessel and bypass graft disease, with  Angina: Pt with typical and atypical features in the setting of longstanding diabetes and diffuse multivessel disease. Recommend a Lexiscan Myoview to further risk stratify. Continue current medications without change.  2. Essential hypertension: BP controlled on current Rx. I rechecked BP and it is 104/66 on the right and 108.68 on the left  3. Hyperlipidemia: treated with rosuvastatin, followed by Dr Waynard Edwards  4. Excessive fatigue: suspect this is multifactorial (sleep problems, keppra, beta-blocker, chronic diseases such as diffuse CAD and diabetes).   Current medicines are reviewed with the patient today.  The patient does not have concerns regarding medicines.  Labs/ tests ordered today include:  No orders of the defined types were placed in this encounter.    Disposition:   FU 6 months  Signed, Tonny Bollman, MD  06/13/2015 11:31 AM    [facility] [address] [place], [place], [place]  [address] Phone: [phone]; Fax: [phone]

## 2015-06-16 ENCOUNTER — Telehealth (HOSPITAL_COMMUNITY): Payer: Self-pay

## 2015-06-16 ENCOUNTER — Encounter (HOSPITAL_COMMUNITY)
Admission: RE | Admit: 2015-06-16 | Discharge: 2015-06-16 | Disposition: A | Discharge status: Home / Self Care | Payer: Self-pay | Source: Ambulatory Visit | Attending: Cardiovascular Disease | Admitting: Cardiovascular Disease

## 2015-06-16 NOTE — Telephone Encounter (Signed)
Patient given detailed instructions per Myocardial Perfusion Study Information Sheet for the test on 06/17/15 at 7:45am. Patient notified to arrive 15 minutes early and that it is imperative to arrive on time for appointment to keep from having the test rescheduled.  If you need to cancel or reschedule your appointment, please call the office within 24 hours of your appointment. Failure to do so may result in a cancellation of your appointment, and a $50 no show fee. Patient verbalized understanding. Farris Has, CNMT, RT-N

## 2015-06-17 ENCOUNTER — Ambulatory Visit (HOSPITAL_COMMUNITY): Payer: Medicare Other | Attending: Cardiovascular Disease

## 2015-06-17 DIAGNOSIS — R0609 Other forms of dyspnea: Secondary | ICD-10-CM | POA: Insufficient documentation

## 2015-06-17 DIAGNOSIS — E119 Type 2 diabetes mellitus without complications: Secondary | ICD-10-CM | POA: Insufficient documentation

## 2015-06-17 DIAGNOSIS — R0602 Shortness of breath: Secondary | ICD-10-CM | POA: Diagnosis not present

## 2015-06-17 DIAGNOSIS — I25719 Atherosclerosis of autologous vein coronary artery bypass graft(s) with unspecified angina pectoris: Secondary | ICD-10-CM | POA: Insufficient documentation

## 2015-06-17 DIAGNOSIS — I10 Essential (primary) hypertension: Secondary | ICD-10-CM | POA: Diagnosis not present

## 2015-06-17 DIAGNOSIS — R0789 Other chest pain: Secondary | ICD-10-CM | POA: Diagnosis not present

## 2015-06-17 DIAGNOSIS — R5383 Other fatigue: Secondary | ICD-10-CM | POA: Diagnosis not present

## 2015-06-17 LAB — MYOCARDIAL PERFUSION IMAGING
CHL CUP NUCLEAR SDS: 2
CHL CUP RESTING HR STRESS: 64 {beats}/min
LHR: 0.28
LV dias vol: 104 mL
LV sys vol: 32 mL
Peak HR: 85 {beats}/min
SRS: 3
SSS: 5
TID: 1.06

## 2015-06-17 MED ORDER — REGADENOSON 0.4 MG/5ML IV SOLN
0.4000 mg | Freq: Once | INTRAVENOUS | Status: AC
  Administered 2015-06-17: 0.4 mg via INTRAVENOUS

## 2015-06-17 MED ORDER — TECHNETIUM TC 99M SESTAMIBI GENERIC - CARDIOLITE
32.6000 | Freq: Once | INTRAVENOUS | Status: AC | PRN
  Administered 2015-06-17: 33 via INTRAVENOUS

## 2015-06-17 MED ORDER — TECHNETIUM TC 99M SESTAMIBI GENERIC - CARDIOLITE
10.6000 | Freq: Once | INTRAVENOUS | Status: AC | PRN
  Administered 2015-06-17: 11 via INTRAVENOUS

## 2015-06-18 ENCOUNTER — Telehealth (HOSPITAL_COMMUNITY): Payer: Self-pay | Admitting: *Deleted

## 2015-06-18 ENCOUNTER — Encounter (HOSPITAL_COMMUNITY)
Admission: RE | Admit: 2015-06-18 | Discharge: 2015-06-18 | Disposition: A | Discharge status: Home / Self Care | Payer: Self-pay | Source: Ambulatory Visit | Attending: Cardiovascular Disease | Admitting: Cardiovascular Disease

## 2015-06-18 NOTE — Telephone Encounter (Signed)
-----   Message from Iona Coach, RN sent at 06/18/2015  9:13 AM EST ----- Regarding: Myoview normal Pt is okay to resume cardiac rehab per Dr Excell Seltzer.  Lauren RN

## 2015-06-20 ENCOUNTER — Encounter (HOSPITAL_COMMUNITY)
Admission: RE | Admit: 2015-06-20 | Discharge: 2015-06-20 | Disposition: A | Discharge status: Home / Self Care | Payer: Self-pay | Source: Ambulatory Visit | Attending: Cardiovascular Disease | Admitting: Cardiovascular Disease

## 2015-06-25 ENCOUNTER — Encounter (HOSPITAL_COMMUNITY)
Admission: RE | Admit: 2015-06-25 | Discharge: 2015-06-25 | Disposition: A | Discharge status: Home / Self Care | Payer: Self-pay | Source: Ambulatory Visit | Attending: Cardiovascular Disease | Admitting: Cardiovascular Disease

## 2015-06-27 ENCOUNTER — Encounter (HOSPITAL_COMMUNITY)
Admission: RE | Admit: 2015-06-27 | Discharge: 2015-06-27 | Disposition: A | Discharge status: Home / Self Care | Payer: Self-pay | Source: Ambulatory Visit | Attending: Cardiovascular Disease | Admitting: Cardiovascular Disease

## 2015-07-02 ENCOUNTER — Encounter (HOSPITAL_COMMUNITY)
Admission: RE | Admit: 2015-07-02 | Discharge: 2015-07-02 | Disposition: A | Discharge status: Home / Self Care | Payer: Self-pay | Source: Ambulatory Visit | Attending: Cardiovascular Disease | Admitting: Cardiovascular Disease

## 2015-07-02 DIAGNOSIS — Z955 Presence of coronary angioplasty implant and graft: Secondary | ICD-10-CM | POA: Insufficient documentation

## 2015-07-02 DIAGNOSIS — I252 Old myocardial infarction: Secondary | ICD-10-CM | POA: Insufficient documentation

## 2015-07-03 ENCOUNTER — Ambulatory Visit: Payer: Medicare Other | Admitting: Cardiovascular Disease

## 2015-07-04 ENCOUNTER — Encounter (HOSPITAL_COMMUNITY)
Admission: RE | Admit: 2015-07-04 | Discharge: 2015-07-04 | Disposition: A | Discharge status: Home / Self Care | Payer: Self-pay | Source: Ambulatory Visit | Attending: Cardiovascular Disease | Admitting: Cardiovascular Disease

## 2015-07-07 ENCOUNTER — Encounter (HOSPITAL_COMMUNITY)
Admission: RE | Admit: 2015-07-07 | Discharge: 2015-07-07 | Disposition: A | Discharge status: Home / Self Care | Payer: Medicare Other | Source: Ambulatory Visit | Attending: Cardiovascular Disease | Admitting: Cardiovascular Disease

## 2015-07-09 ENCOUNTER — Encounter (HOSPITAL_COMMUNITY)
Admission: RE | Admit: 2015-07-09 | Discharge: 2015-07-09 | Disposition: A | Discharge status: Home / Self Care | Payer: Self-pay | Source: Ambulatory Visit | Attending: Cardiovascular Disease | Admitting: Cardiovascular Disease

## 2015-07-11 ENCOUNTER — Encounter (HOSPITAL_COMMUNITY)
Admission: RE | Admit: 2015-07-11 | Discharge: 2015-07-11 | Disposition: A | Discharge status: Home / Self Care | Payer: Self-pay | Source: Ambulatory Visit | Attending: Cardiovascular Disease | Admitting: Cardiovascular Disease

## 2015-07-14 ENCOUNTER — Encounter (HOSPITAL_COMMUNITY)
Admission: RE | Admit: 2015-07-14 | Discharge: 2015-07-14 | Disposition: A | Discharge status: Home / Self Care | Payer: Medicare Other | Source: Ambulatory Visit | Attending: Cardiovascular Disease | Admitting: Cardiovascular Disease

## 2015-07-16 ENCOUNTER — Encounter (HOSPITAL_COMMUNITY)
Admission: RE | Admit: 2015-07-16 | Discharge: 2015-07-16 | Disposition: A | Discharge status: Home / Self Care | Payer: Self-pay | Source: Ambulatory Visit | Attending: Cardiovascular Disease | Admitting: Cardiovascular Disease

## 2015-07-18 ENCOUNTER — Encounter (HOSPITAL_COMMUNITY)
Admission: RE | Admit: 2015-07-18 | Discharge: 2015-07-18 | Disposition: A | Discharge status: Home / Self Care | Payer: Self-pay | Source: Ambulatory Visit | Attending: Cardiovascular Disease | Admitting: Cardiovascular Disease

## 2015-07-21 ENCOUNTER — Encounter (HOSPITAL_COMMUNITY)
Admission: RE | Admit: 2015-07-21 | Discharge: 2015-07-21 | Disposition: A | Discharge status: Home / Self Care | Payer: Self-pay | Source: Ambulatory Visit | Attending: Cardiovascular Disease | Admitting: Cardiovascular Disease

## 2015-07-23 ENCOUNTER — Encounter (HOSPITAL_COMMUNITY)
Admission: RE | Admit: 2015-07-23 | Discharge: 2015-07-23 | Disposition: A | Discharge status: Home / Self Care | Payer: Self-pay | Source: Ambulatory Visit | Attending: Cardiovascular Disease | Admitting: Cardiovascular Disease

## 2015-07-25 ENCOUNTER — Encounter (HOSPITAL_COMMUNITY)
Admission: RE | Admit: 2015-07-25 | Discharge: 2015-07-25 | Disposition: A | Discharge status: Home / Self Care | Payer: Self-pay | Source: Ambulatory Visit | Attending: Cardiovascular Disease | Admitting: Cardiovascular Disease

## 2015-07-28 ENCOUNTER — Encounter (HOSPITAL_COMMUNITY)
Admission: RE | Admit: 2015-07-28 | Discharge: 2015-07-28 | Disposition: A | Discharge status: Home / Self Care | Payer: Self-pay | Source: Ambulatory Visit | Attending: Cardiovascular Disease | Admitting: Cardiovascular Disease

## 2015-07-30 ENCOUNTER — Encounter (HOSPITAL_COMMUNITY)
Admission: RE | Admit: 2015-07-30 | Discharge: 2015-07-30 | Disposition: A | Discharge status: Home / Self Care | Payer: Self-pay | Source: Ambulatory Visit | Attending: Cardiovascular Disease | Admitting: Cardiovascular Disease

## 2015-07-30 DIAGNOSIS — I252 Old myocardial infarction: Secondary | ICD-10-CM | POA: Insufficient documentation

## 2015-07-30 DIAGNOSIS — Z955 Presence of coronary angioplasty implant and graft: Secondary | ICD-10-CM | POA: Insufficient documentation

## 2015-08-01 ENCOUNTER — Encounter (HOSPITAL_COMMUNITY)
Admission: RE | Admit: 2015-08-01 | Discharge: 2015-08-01 | Disposition: A | Discharge status: Home / Self Care | Payer: Self-pay | Source: Ambulatory Visit | Attending: Cardiovascular Disease | Admitting: Cardiovascular Disease

## 2015-08-04 ENCOUNTER — Encounter (HOSPITAL_COMMUNITY)
Admission: RE | Admit: 2015-08-04 | Discharge: 2015-08-04 | Disposition: A | Discharge status: Home / Self Care | Payer: Self-pay | Source: Ambulatory Visit | Attending: Cardiovascular Disease | Admitting: Cardiovascular Disease

## 2015-08-06 ENCOUNTER — Encounter (HOSPITAL_COMMUNITY)
Admission: RE | Admit: 2015-08-06 | Discharge: 2015-08-06 | Disposition: A | Discharge status: Home / Self Care | Payer: Self-pay | Source: Ambulatory Visit | Attending: Cardiovascular Disease | Admitting: Cardiovascular Disease

## 2015-08-08 ENCOUNTER — Encounter (HOSPITAL_COMMUNITY)
Admission: RE | Admit: 2015-08-08 | Discharge: 2015-08-08 | Disposition: A | Discharge status: Home / Self Care | Payer: Self-pay | Source: Ambulatory Visit | Attending: Cardiovascular Disease | Admitting: Cardiovascular Disease

## 2015-08-11 ENCOUNTER — Encounter (HOSPITAL_COMMUNITY)
Admission: RE | Admit: 2015-08-11 | Discharge: 2015-08-11 | Disposition: A | Discharge status: Home / Self Care | Payer: Self-pay | Source: Ambulatory Visit | Attending: Cardiovascular Disease | Admitting: Cardiovascular Disease

## 2015-08-13 ENCOUNTER — Encounter (HOSPITAL_COMMUNITY)
Admission: RE | Admit: 2015-08-13 | Discharge: 2015-08-13 | Disposition: A | Discharge status: Home / Self Care | Payer: Self-pay | Source: Ambulatory Visit | Attending: Cardiovascular Disease | Admitting: Cardiovascular Disease

## 2015-08-15 ENCOUNTER — Encounter (HOSPITAL_COMMUNITY)
Admission: RE | Admit: 2015-08-15 | Discharge: 2015-08-15 | Disposition: A | Discharge status: Home / Self Care | Payer: Self-pay | Source: Ambulatory Visit | Attending: Cardiovascular Disease | Admitting: Cardiovascular Disease

## 2015-08-18 ENCOUNTER — Encounter (HOSPITAL_COMMUNITY): Payer: Self-pay

## 2015-08-20 ENCOUNTER — Encounter (HOSPITAL_COMMUNITY): Payer: Self-pay

## 2015-08-22 ENCOUNTER — Encounter (HOSPITAL_COMMUNITY)
Admission: RE | Admit: 2015-08-22 | Discharge: 2015-08-22 | Disposition: A | Discharge status: Home / Self Care | Payer: Self-pay | Source: Ambulatory Visit | Attending: Cardiovascular Disease | Admitting: Cardiovascular Disease

## 2015-08-25 ENCOUNTER — Encounter (HOSPITAL_COMMUNITY)
Admission: RE | Admit: 2015-08-25 | Discharge: 2015-08-25 | Disposition: A | Discharge status: Home / Self Care | Payer: Self-pay | Source: Ambulatory Visit | Attending: Cardiovascular Disease | Admitting: Cardiovascular Disease

## 2015-08-27 ENCOUNTER — Encounter (HOSPITAL_COMMUNITY): Payer: Self-pay

## 2015-08-27 DIAGNOSIS — Z955 Presence of coronary angioplasty implant and graft: Secondary | ICD-10-CM | POA: Insufficient documentation

## 2015-08-27 DIAGNOSIS — I252 Old myocardial infarction: Secondary | ICD-10-CM | POA: Insufficient documentation

## 2015-08-29 ENCOUNTER — Encounter (HOSPITAL_COMMUNITY)
Admission: RE | Admit: 2015-08-29 | Discharge: 2015-08-29 | Disposition: A | Discharge status: Home / Self Care | Payer: Self-pay | Source: Ambulatory Visit | Attending: Cardiovascular Disease | Admitting: Cardiovascular Disease

## 2015-09-01 ENCOUNTER — Encounter (HOSPITAL_COMMUNITY)
Admission: RE | Admit: 2015-09-01 | Discharge: 2015-09-01 | Disposition: A | Discharge status: Home / Self Care | Payer: Self-pay | Source: Ambulatory Visit | Attending: Cardiovascular Disease | Admitting: Cardiovascular Disease

## 2015-09-01 ENCOUNTER — Encounter: Payer: Self-pay | Admitting: Gastroenterology

## 2015-09-03 ENCOUNTER — Encounter (HOSPITAL_COMMUNITY)
Admission: RE | Admit: 2015-09-03 | Discharge: 2015-09-03 | Disposition: A | Discharge status: Home / Self Care | Payer: Self-pay | Source: Ambulatory Visit | Attending: Cardiovascular Disease | Admitting: Cardiovascular Disease

## 2015-09-05 ENCOUNTER — Encounter (HOSPITAL_COMMUNITY)
Admission: RE | Admit: 2015-09-05 | Discharge: 2015-09-05 | Disposition: A | Discharge status: Home / Self Care | Payer: Self-pay | Source: Ambulatory Visit | Attending: Cardiovascular Disease | Admitting: Cardiovascular Disease

## 2015-09-08 ENCOUNTER — Encounter (HOSPITAL_COMMUNITY)
Admission: RE | Admit: 2015-09-08 | Discharge: 2015-09-08 | Disposition: A | Discharge status: Home / Self Care | Payer: Self-pay | Source: Ambulatory Visit | Attending: Cardiovascular Disease | Admitting: Cardiovascular Disease

## 2015-09-10 ENCOUNTER — Encounter (HOSPITAL_COMMUNITY)
Admission: RE | Admit: 2015-09-10 | Discharge: 2015-09-10 | Disposition: A | Discharge status: Home / Self Care | Payer: Self-pay | Source: Ambulatory Visit | Attending: Cardiovascular Disease | Admitting: Cardiovascular Disease

## 2015-09-12 ENCOUNTER — Encounter (HOSPITAL_COMMUNITY)
Admission: RE | Admit: 2015-09-12 | Discharge: 2015-09-12 | Disposition: A | Discharge status: Home / Self Care | Payer: Self-pay | Source: Ambulatory Visit | Attending: Cardiovascular Disease | Admitting: Cardiovascular Disease

## 2015-09-15 ENCOUNTER — Encounter (HOSPITAL_COMMUNITY)
Admission: RE | Admit: 2015-09-15 | Discharge: 2015-09-15 | Disposition: A | Discharge status: Home / Self Care | Payer: Self-pay | Source: Ambulatory Visit | Attending: Cardiovascular Disease | Admitting: Cardiovascular Disease

## 2015-09-17 ENCOUNTER — Encounter (HOSPITAL_COMMUNITY): Payer: Self-pay

## 2015-09-19 ENCOUNTER — Encounter (HOSPITAL_COMMUNITY)
Admission: RE | Admit: 2015-09-19 | Discharge: 2015-09-19 | Disposition: A | Discharge status: Home / Self Care | Payer: Self-pay | Source: Ambulatory Visit | Attending: Cardiovascular Disease | Admitting: Cardiovascular Disease

## 2015-09-22 ENCOUNTER — Encounter (HOSPITAL_COMMUNITY)
Admission: RE | Admit: 2015-09-22 | Discharge: 2015-09-22 | Disposition: A | Discharge status: Home / Self Care | Payer: Self-pay | Source: Ambulatory Visit | Attending: Cardiovascular Disease | Admitting: Cardiovascular Disease

## 2015-09-24 ENCOUNTER — Encounter (HOSPITAL_COMMUNITY)
Admission: RE | Admit: 2015-09-24 | Discharge: 2015-09-24 | Disposition: A | Discharge status: Home / Self Care | Payer: Self-pay | Source: Ambulatory Visit | Attending: Cardiovascular Disease | Admitting: Cardiovascular Disease

## 2015-09-26 ENCOUNTER — Encounter (HOSPITAL_COMMUNITY)
Admission: RE | Admit: 2015-09-26 | Discharge: 2015-09-26 | Disposition: A | Discharge status: Home / Self Care | Payer: Self-pay | Source: Ambulatory Visit | Attending: Cardiovascular Disease | Admitting: Cardiovascular Disease

## 2015-09-29 ENCOUNTER — Encounter (HOSPITAL_COMMUNITY)
Admission: RE | Admit: 2015-09-29 | Discharge: 2015-09-29 | Disposition: A | Discharge status: Home / Self Care | Payer: Self-pay | Source: Ambulatory Visit | Attending: Cardiovascular Disease | Admitting: Cardiovascular Disease

## 2015-09-29 DIAGNOSIS — I252 Old myocardial infarction: Secondary | ICD-10-CM | POA: Insufficient documentation

## 2015-09-29 DIAGNOSIS — Z955 Presence of coronary angioplasty implant and graft: Secondary | ICD-10-CM | POA: Insufficient documentation

## 2015-10-01 ENCOUNTER — Encounter (HOSPITAL_COMMUNITY)
Admission: RE | Admit: 2015-10-01 | Discharge: 2015-10-01 | Disposition: A | Discharge status: Home / Self Care | Payer: Self-pay | Source: Ambulatory Visit | Attending: Cardiovascular Disease | Admitting: Cardiovascular Disease

## 2015-10-03 ENCOUNTER — Encounter (HOSPITAL_COMMUNITY)
Admission: RE | Admit: 2015-10-03 | Discharge: 2015-10-03 | Disposition: A | Discharge status: Home / Self Care | Payer: Self-pay | Source: Ambulatory Visit | Attending: Cardiovascular Disease | Admitting: Cardiovascular Disease

## 2015-10-06 ENCOUNTER — Encounter (HOSPITAL_COMMUNITY)
Admission: RE | Admit: 2015-10-06 | Discharge: 2015-10-06 | Disposition: A | Discharge status: Home / Self Care | Payer: Self-pay | Source: Ambulatory Visit | Attending: Cardiovascular Disease | Admitting: Cardiovascular Disease

## 2015-10-08 ENCOUNTER — Encounter (HOSPITAL_COMMUNITY)
Admission: RE | Admit: 2015-10-08 | Discharge: 2015-10-08 | Disposition: A | Discharge status: Home / Self Care | Payer: Self-pay | Source: Ambulatory Visit | Attending: Cardiovascular Disease | Admitting: Cardiovascular Disease

## 2015-10-10 ENCOUNTER — Encounter (HOSPITAL_COMMUNITY): Payer: Self-pay

## 2015-10-13 ENCOUNTER — Encounter (HOSPITAL_COMMUNITY)
Admission: RE | Admit: 2015-10-13 | Discharge: 2015-10-13 | Disposition: A | Discharge status: Home / Self Care | Payer: Self-pay | Source: Ambulatory Visit | Attending: Cardiovascular Disease | Admitting: Cardiovascular Disease

## 2015-10-15 ENCOUNTER — Encounter (HOSPITAL_COMMUNITY)
Admission: RE | Admit: 2015-10-15 | Discharge: 2015-10-15 | Disposition: A | Discharge status: Home / Self Care | Payer: Self-pay | Source: Ambulatory Visit | Attending: Cardiovascular Disease | Admitting: Cardiovascular Disease

## 2015-10-17 ENCOUNTER — Encounter (HOSPITAL_COMMUNITY)
Admission: RE | Admit: 2015-10-17 | Discharge: 2015-10-17 | Disposition: A | Discharge status: Home / Self Care | Payer: Self-pay | Source: Ambulatory Visit | Attending: Cardiovascular Disease | Admitting: Cardiovascular Disease

## 2015-10-20 ENCOUNTER — Encounter (HOSPITAL_COMMUNITY)
Admission: RE | Admit: 2015-10-20 | Discharge: 2015-10-20 | Disposition: A | Discharge status: Home / Self Care | Payer: Self-pay | Source: Ambulatory Visit | Attending: Cardiovascular Disease | Admitting: Cardiovascular Disease

## 2015-10-22 ENCOUNTER — Encounter (HOSPITAL_COMMUNITY)
Admission: RE | Admit: 2015-10-22 | Discharge: 2015-10-22 | Disposition: A | Discharge status: Home / Self Care | Payer: Self-pay | Source: Ambulatory Visit | Attending: Cardiovascular Disease | Admitting: Cardiovascular Disease

## 2015-10-24 ENCOUNTER — Encounter (HOSPITAL_COMMUNITY)
Admission: RE | Admit: 2015-10-24 | Discharge: 2015-10-24 | Disposition: A | Discharge status: Home / Self Care | Payer: Self-pay | Source: Ambulatory Visit | Attending: Cardiovascular Disease | Admitting: Cardiovascular Disease

## 2015-10-27 ENCOUNTER — Encounter (HOSPITAL_COMMUNITY)
Admission: RE | Admit: 2015-10-27 | Discharge: 2015-10-27 | Disposition: A | Discharge status: Home / Self Care | Payer: Self-pay | Source: Ambulatory Visit | Attending: Cardiovascular Disease | Admitting: Cardiovascular Disease

## 2015-10-27 DIAGNOSIS — Z955 Presence of coronary angioplasty implant and graft: Secondary | ICD-10-CM | POA: Insufficient documentation

## 2015-10-27 DIAGNOSIS — I252 Old myocardial infarction: Secondary | ICD-10-CM | POA: Insufficient documentation

## 2015-10-28 ENCOUNTER — Ambulatory Visit (INDEPENDENT_AMBULATORY_CARE_PROVIDER_SITE_OTHER): Payer: Medicare Other | Admitting: Neurology

## 2015-10-28 ENCOUNTER — Encounter: Payer: Self-pay | Admitting: Neurology

## 2015-10-28 VITALS — BP 108/60 | HR 74 | Ht 71.0 in | Wt 239.5 lb

## 2015-10-28 DIAGNOSIS — Q8501 Neurofibromatosis, type 1: Secondary | ICD-10-CM | POA: Diagnosis not present

## 2015-10-28 DIAGNOSIS — Z5181 Encounter for therapeutic drug level monitoring: Secondary | ICD-10-CM | POA: Diagnosis not present

## 2015-10-28 DIAGNOSIS — G40209 Localization-related (focal) (partial) symptomatic epilepsy and epileptic syndromes with complex partial seizures, not intractable, without status epilepticus: Secondary | ICD-10-CM

## 2015-10-28 MED ORDER — CARBAMAZEPINE ER 200 MG PO TB12: 600.0000 mg | ORAL_TABLET | Freq: Two times a day (BID) | ORAL | Status: DC

## 2015-10-28 MED ORDER — LEVETIRACETAM 500 MG PO TABS: ORAL_TABLET | ORAL | Status: DC

## 2015-10-28 NOTE — Progress Notes (Signed)
Reason for visit: Seizures  Jeff [name] is an 50 y.o. male  History of present illness:  Jeff [name] is a 50 year old left-handed white male with a history of neurofibromatosis type I and seizures. The patient has been well controlled on the combination of Keppra and carbamazepine. The patient indicates that he frequently has difficulty getting brand-name Tegretol. 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 He still operates a motor vehicle without difficulty, but he does not drive at night when it is raining. The patient denies any other new symptoms of numbness, weakness, headache issues. He returns to this office for an evaluation. He does report some chronic issues with insomnia.  Past Medical History  Diagnosis Date  . CAD (coronary artery disease)     Cath 2011.  LAD stent patent, distal LAD occlusion, D1 100%, OM branch 80 - 90%, SVG to diag patent, LIMA to LAD occluded, SVG to RCA occluded, SVG to OM was occluded.   No change from previous cath.  Managed medically.   . Diabetes mellitus type I (HCC)     on insulin pump at  home  . Sleep apnea     s/p oral surgery  . Hyperlipidemia   . 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 Diabetic retinopathy     s/p vitrectomy and history of retinal surgery  . HTN (hypertension)   . Seizure disorder (HCC)   . Seasonal allergic rhinitis   . Depression   . Cataract   . Hemorrhoid   . Myocardial infarction (HCC)   . Anxiety   . History of oxygen administration     nocutural use only at 2 l/m nasally.  . Toe fracture, right     11-23-14 fracture right big toe-wearing a stability shoe    Past Surgical History  Procedure Laterality Date  . Coronary artery bypass graft      4 vessel 4/11  . Uvulopalatoplasty surgery    . Tonsillectomy    . Release of right transverse carpal ligament    . Right eye  vitrectomy and detached retina repair  10/2007  . Laparoscopic appendectomy  11/24/2011  . Appendectomy    . Left heart catheterization with coronary/graft angiogram N/A 02/22/2013    Procedure: LEFT HEART CATHETERIZATION WITH [name];  Surgeon: Tonny Bollman, MD;  Location: [facility] CATH LAB;  Service: Cardiovascular;  Laterality: N/A;  . Cardiac catheterization    . Coronary angioplasty      9'11,  CABPG grafts had failed.  . Cataract extraction, bilateral    . Esophagogastroduodenoscopy (egd) with propofol N/A 12/05/2014    Procedure: ESOPHAGOGASTRODUODENOSCOPY (EGD) WITH PROPOFOL;  Surgeon: [name], MD;  Location: [facility] ENDOSCOPY;  Service: Endoscopy;  Laterality: N/A;    Family History  Problem Relation Age of Onset  . Coronary artery disease Father 61  . Diabetes Father     type II  . Hypertension Father   . Squamous cell carcinoma Brother   . Arthritis Sister     Social history:  reports that he has never smoked. He has never used smokeless tobacco. He reports that he does not drink alcohol or use illicit drugs.    Allergies  Allergen Reactions  . Latex Rash    Medications:  Prior to Admission medications   Medication Sig Start Date End Date Taking? Authorizing Provider  acetaminophen (TYLENOL) 500 MG tablet Take 1,000 mg by mouth every 6 (six) hours as needed for mild pain.   Yes Historical Provider, MD  aspirin 81 MG tablet Take 81 mg by mouth daily.    Yes Historical Provider, MD  carbamazepine (TEGRETOL XR) 200 MG 12 hr tablet Take 3 tablets (600 mg total) by mouth 2 (two) times daily. Brand is medically necessary. 10/28/14  Yes York Spaniel, MD  Desvenlafaxine Succinate ER 25 MG TB24 take 1 tablet by mouth every morning for 2 weeks then INCREASE TO...  (REFER TO PRESCRIPTION NOTES). 10/18/15  Yes Historical Provider, MD  docusate sodium (COLACE) 100 MG capsule Take 100 mg by mouth daily.    Yes Historical Provider, MD  escitalopram (LEXAPRO) 20 MG  tablet Pt titrating off of Lexapro and transitioning to Pristique 10/22/15  Yes Historical Provider, MD  ezetimibe (ZETIA) 10 MG tablet Take 10 mg by mouth at bedtime.    Yes Historical Provider, MD  HUMALOG 100 UNIT/ML injection  10/22/15  Yes Historical Provider, MD  insulin aspart (NOVOLOG) 100 UNIT/ML injection Inject into the skin. 09/08/10  Yes Historical Provider, MD  Insulin Human (INSULIN PUMP) 100 unit/ml SOLN Inject into the skin 3 (three) times daily before meals. Add 8-11 units per insulin pump 3 times daily before meals per bolus scale.   Yes Historical Provider, MD  isosorbide mononitrate (IMDUR) 60 MG 24 hr tablet Take 60 mg by mouth every morning.    Yes Historical Provider, MD  levETIRAcetam (KEPPRA) 500 MG tablet Take 250 mg every morning and 500 mg every evening. 10/28/14  Yes York Spaniel, MD  lisinopril (PRINIVIL,ZESTRIL) 5 MG tablet Take 5 mg by mouth every morning.    Yes Historical Provider, MD  metoprolol tartrate (LOPRESSOR) 25 MG tablet Take 1 tablet (25 mg total) by mouth 2 (two) times daily. 06/13/15  Yes Tonny Bollman, MD  multivitamin (ONE-A-DAY MEN'S) TABS Take 1 tablet by mouth daily.    Yes Historical Provider, MD  omeprazole (PRILOSEC) 20 MG capsule Take 1 capsule (20 mg total) by mouth daily before breakfast. 01/22/15  Yes Amy S Esterwood, PA-C  ONE TOUCH ULTRA TEST test strip  09/06/15  Yes Historical Provider, MD  ONETOUCH DELICA LANCETS 33G MISC USE TO SELF MONITOR BLOOD GLUCOSE UP TO 7 TIMES PER DAY 09/06/15  Yes Historical Provider, MD  OXYGEN Place 2 L into the nose at bedtime.   Yes Historical Provider, MD  rosuvastatin (CRESTOR) 40 MG tablet Take 40 mg by mouth at bedtime.    Yes Historical Provider, MD  vitamin B-12 (CYANOCOBALAMIN) 500 MCG tablet Take 500 mcg by mouth daily.   Yes Historical Provider, MD  nitroGLYCERIN (NITROSTAT) 0.4 MG SL tablet place 1 tablet under the tongue if needed every 5 minutes for chest pain Patient not taking: Reported on  10/28/2015 06/13/15   Tonny Bollman, MD    ROS:  Out of a complete 14 system review of symptoms, the patient complains only of the following symptoms, and all other reviewed systems are negative.  Fatigue Hearing loss, ear pain, ringing in the ears Restless legs, insomnia, sleep apnea, daytime sleepiness, snoring Frequency of urination Thomas urgency of the bladder Depression, anxiety  Blood pressure 108/60, pulse 74, height 5\' 11"  (1.803 m), weight 239 lb 8 oz (108.636 kg).  Physical Exam  General: The patient is alert and cooperative at the time of the examination.  Skin: No significant peripheral edema is noted.   Neurologic Exam  Mental status: The patient is alert and oriented x 3 at the time of the examination. The patient has apparent normal recent and remote memory, with an apparently normal attention span and concentration ability.   Cranial nerves: Facial symmetry is present. Speech is normal, no aphasia or dysarthria is noted. Extraocular movements are full. Visual fields are notable for some constriction of peripheral vision bilaterally.  Motor: The patient has good strength in all 4 extremities.  Sensory examination: Soft touch sensation is symmetric on the face, arms, and legs.  Coordination: The patient has good finger-nose-finger and heel-to-shin bilaterally.  Gait and station: The patient has a normal gait. Tandem gait is normal. Romberg is negative. No drift is seen.  Reflexes: Deep tendon reflexes are symmetric.   Assessment/Plan:  1. Neurofibromatosis type I  2. Seizures  The patient is been relatively well controlled on a carbamazepine and Keppra. Prescriptions for these medications were given. The patient will go on vitamin D supplementation as he is on carbamazepine. He will follow-up in one year, sooner if needed. The last MRI of the brain was done in 2014.  Marlan Palau MD 10/28/2015 8:42 PM  [facility] [address] [place], [place] [address]  Phone [phone] Fax [phone]

## 2015-10-28 NOTE — Progress Notes (Signed)
Scripts for Keppra and Tegretol faxed to [facility] [phone])

## 2015-10-29 ENCOUNTER — Encounter (HOSPITAL_COMMUNITY)
Admission: RE | Admit: 2015-10-29 | Discharge: 2015-10-29 | Disposition: A | Discharge status: Home / Self Care | Payer: Self-pay | Source: Ambulatory Visit | Attending: Cardiovascular Disease | Admitting: Cardiovascular Disease

## 2015-10-29 LAB — COMPREHENSIVE METABOLIC PANEL
A/G RATIO: 2.5 — AB (ref 1.2–2.2)
ALBUMIN: 4.3 g/dL (ref 3.5–5.5)
ALK PHOS: 76 IU/L (ref 39–117)
ALT: 27 IU/L (ref 0–44)
AST: 24 IU/L (ref 0–40)
BILIRUBIN TOTAL: 0.3 mg/dL (ref 0.0–1.2)
BUN / CREAT RATIO: 16 (ref 9–20)
BUN: 12 mg/dL (ref 6–24)
CHLORIDE: 100 mmol/L (ref 96–106)
CO2: 26 mmol/L (ref 18–29)
Calcium: 9 mg/dL (ref 8.7–10.2)
Creatinine, Ser: 0.74 mg/dL — ABNORMAL LOW (ref 0.76–1.27)
GFR calc non Af Amer: 107 mL/min/{1.73_m2} (ref >59)
GFR, EST AFRICAN AMERICAN: 123 mL/min/{1.73_m2} (ref >59)
GLUCOSE: 191 mg/dL — AB (ref 65–99)
Globulin, Total: 1.7 g/dL (ref 1.5–4.5)
Potassium: 5.1 mmol/L (ref 3.5–5.2)
SODIUM: 140 mmol/L (ref 134–144)
TOTAL PROTEIN: 6 g/dL (ref 6.0–8.5)

## 2015-10-29 LAB — CBC WITH DIFFERENTIAL/PLATELET
BASOS ABS: 0 10*3/uL (ref 0.0–0.2)
BASOS: 1 %
EOS (ABSOLUTE): 0.2 10*3/uL (ref 0.0–0.4)
Eos: 4 %
Hematocrit: 43.5 % (ref 37.5–51.0)
Hemoglobin: 14.3 g/dL (ref 12.6–17.7)
IMMATURE GRANULOCYTES: 0 %
Immature Grans (Abs): 0 10*3/uL (ref 0.0–0.1)
Lymphocytes Absolute: 1.3 10*3/uL (ref 0.7–3.1)
Lymphs: 26 %
MCH: 30.4 pg (ref 26.6–33.0)
MCHC: 32.9 g/dL (ref 31.5–35.7)
MCV: 92 fL (ref 79–97)
MONOS ABS: 0.5 10*3/uL (ref 0.1–0.9)
Monocytes: 10 %
NEUTROS ABS: 3 10*3/uL (ref 1.4–7.0)
NEUTROS PCT: 59 %
PLATELETS: 181 10*3/uL (ref 150–379)
RBC: 4.71 x10E6/uL (ref 4.14–5.80)
RDW: 13.4 % (ref 12.3–15.4)
WBC: 5.1 10*3/uL (ref 3.4–10.8)

## 2015-10-29 LAB — CARBAMAZEPINE LEVEL, TOTAL: Carbamazepine (Tegretol), S: 8.9 ug/mL (ref 4.0–12.0)

## 2015-10-30 ENCOUNTER — Telehealth: Payer: Self-pay

## 2015-10-30 NOTE — Telephone Encounter (Signed)
Called pt and reviewed lab results. Verbalized understanding of continuing current dose of carbamazepine. Questions answered re: OTC vitamin D recommended at last OV.

## 2015-10-30 NOTE — Telephone Encounter (Signed)
-----   Message from York Spaniel, MD sent at 10/29/2015  2:03 PM EDT ----- Elevation in blood sugar, otherwise liver profile and chemistries are unremarkable, CBC is normal. Therapeutic carbamazepine level, no change in dosing. Please call the patient. ----- Message -----    From: [facility] Lab Results In Interface    Sent: 10/29/2015   7:44 AM      To: York Spaniel, MD

## 2015-10-31 ENCOUNTER — Encounter (HOSPITAL_COMMUNITY)
Admission: RE | Admit: 2015-10-31 | Discharge: 2015-10-31 | Disposition: A | Discharge status: Home / Self Care | Payer: Self-pay | Source: Ambulatory Visit | Attending: Cardiovascular Disease | Admitting: Cardiovascular Disease

## 2015-11-03 ENCOUNTER — Encounter (HOSPITAL_COMMUNITY)
Admission: RE | Admit: 2015-11-03 | Discharge: 2015-11-03 | Disposition: A | Discharge status: Home / Self Care | Payer: Self-pay | Source: Ambulatory Visit | Attending: Cardiovascular Disease | Admitting: Cardiovascular Disease

## 2015-11-05 ENCOUNTER — Encounter (HOSPITAL_COMMUNITY)
Admission: RE | Admit: 2015-11-05 | Discharge: 2015-11-05 | Disposition: A | Discharge status: Home / Self Care | Payer: Self-pay | Source: Ambulatory Visit | Attending: Cardiovascular Disease | Admitting: Cardiovascular Disease

## 2015-11-07 ENCOUNTER — Encounter (HOSPITAL_COMMUNITY)
Admission: RE | Admit: 2015-11-07 | Discharge: 2015-11-07 | Disposition: A | Discharge status: Home / Self Care | Payer: Self-pay | Source: Ambulatory Visit | Attending: Cardiovascular Disease | Admitting: Cardiovascular Disease

## 2015-11-10 ENCOUNTER — Encounter (HOSPITAL_COMMUNITY)
Admission: RE | Admit: 2015-11-10 | Discharge: 2015-11-10 | Disposition: A | Discharge status: Home / Self Care | Payer: Self-pay | Source: Ambulatory Visit | Attending: Cardiovascular Disease | Admitting: Cardiovascular Disease

## 2015-11-12 ENCOUNTER — Encounter (HOSPITAL_COMMUNITY): Payer: Self-pay

## 2015-11-14 ENCOUNTER — Ambulatory Visit (INDEPENDENT_AMBULATORY_CARE_PROVIDER_SITE_OTHER): Payer: Medicare Other | Admitting: Physician Assistant

## 2015-11-14 ENCOUNTER — Encounter (HOSPITAL_COMMUNITY)
Admission: RE | Admit: 2015-11-14 | Discharge: 2015-11-14 | Disposition: A | Discharge status: Home / Self Care | Payer: Self-pay | Source: Ambulatory Visit | Attending: Cardiovascular Disease | Admitting: Cardiovascular Disease

## 2015-11-14 ENCOUNTER — Ambulatory Visit (INDEPENDENT_AMBULATORY_CARE_PROVIDER_SITE_OTHER): Payer: Medicare Other

## 2015-11-14 VITALS — BP 130/70 | HR 79 | Temp 98.2°F | Resp 16 | Ht 71.0 in | Wt 298.4 lb

## 2015-11-14 DIAGNOSIS — M79674 Pain in right toe(s): Secondary | ICD-10-CM

## 2015-11-14 NOTE — Patient Instructions (Signed)
Your xray was normal.  Please continue to buddy tape the great toe to the 2nd toe.  Please refrain from treadmill for 2 weeks at cardiac rehab. Using bike, rower, or nustep in place of treadmill.  Ice, tylenol as needed will help with the pain.  If you're not improved in 4 weeks please let us know and I can refer you back to ortho.

## 2015-11-14 NOTE — Progress Notes (Signed)
   Subjective:    Patient ID: Jeff [name], male    DOB: 1966-03-15, 50 y.o.   MRN: [id number]  Chief Complaint  Patient presents with  . Toe Pain    Great toe, right foot, started today, pt is diabetic  . Depression    See depression screening   Medications, allergies, past medical history, surgical history, family history, social history and problem list reviewed and updated.  HPI  49 yom presents with above complaints.  Denies depression.   Walking on treadmill this am at cardiac rehab. Right great toe pain fairly sudden onset 7/10. Intermittent through remainder of day. Worse with walking. Denies trauma. Denies fevers, chills.   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 He is DM1 and concerned about infection risk so decided to come in.   Review of Systems See HPI     Objective:   Physical Exam  Constitutional: He is oriented to person, place, and time. He appears well-developed and well-nourished.  Non-toxic appearance. He does not have a sickly appearance. He does not appear ill. No distress.  BP 130/70 mmHg  Pulse 79  Temp(Src) 98.2 F (36.8 C) (Oral)  Resp 16  Ht 5\' 11"  (1.803 m)  Wt 298 lb 6.4 oz (135.353 kg)  BMI 41.64 kg/m2  SpO2 97%   Musculoskeletal:       Right ankle: Normal.       Right foot: There is tenderness and swelling. There is normal range of motion and normal capillary refill.  Mild TTP/swelling right great toe. No erythema. No warmth. No exquisite TTP. Normal rom great toe.   Neurological: He is alert and oriented to person, place, and time.   5/19 right great toe xray FINDINGS: Osseous demineralization.  Deformity at head of proximal phalanx RIGHT great toe consistent with old healed fracture.  Joint spaces grossly preserved.  No definite acute fracture, dislocation, or bone destruction.  Question pes cavus.  IMPRESSION: Old healed fracture proximal phalanx RIGHT great toe.  Osseous demineralization.  No definite acute bony  abnormalities.     Assessment & Plan:   Pain in toe of right foot - Plan: DG Toe Great Right --xr without acute fracture, buddy tape  --recommend no treadmill for 2 weeks in cardiac rehab - bike, nustep instead --let us know if no relief in 2-4 weeks, will refer back to ortho for further w/u   Donnajean Lopes, PA-C Physician Assistant-Certified [facility]  11/14/2015 5:08 PM

## 2015-11-17 ENCOUNTER — Encounter (HOSPITAL_COMMUNITY): Payer: Self-pay

## 2015-11-19 ENCOUNTER — Encounter (HOSPITAL_COMMUNITY)
Admission: RE | Admit: 2015-11-19 | Discharge: 2015-11-19 | Disposition: A | Discharge status: Home / Self Care | Payer: Self-pay | Source: Ambulatory Visit | Attending: Cardiovascular Disease | Admitting: Cardiovascular Disease

## 2015-11-21 ENCOUNTER — Encounter (HOSPITAL_COMMUNITY): Payer: Self-pay

## 2015-11-26 ENCOUNTER — Encounter (HOSPITAL_COMMUNITY)
Admission: RE | Admit: 2015-11-26 | Discharge: 2015-11-26 | Disposition: A | Discharge status: Home / Self Care | Payer: Self-pay | Source: Ambulatory Visit | Attending: Cardiovascular Disease | Admitting: Cardiovascular Disease

## 2015-11-28 ENCOUNTER — Encounter (HOSPITAL_COMMUNITY)
Admission: RE | Admit: 2015-11-28 | Discharge: 2015-11-28 | Disposition: A | Discharge status: Home / Self Care | Payer: Self-pay | Source: Ambulatory Visit | Attending: Cardiovascular Disease | Admitting: Cardiovascular Disease

## 2015-11-28 DIAGNOSIS — I252 Old myocardial infarction: Secondary | ICD-10-CM | POA: Insufficient documentation

## 2015-11-28 DIAGNOSIS — Z955 Presence of coronary angioplasty implant and graft: Secondary | ICD-10-CM | POA: Insufficient documentation

## 2015-12-01 ENCOUNTER — Encounter (HOSPITAL_COMMUNITY)
Admission: RE | Admit: 2015-12-01 | Discharge: 2015-12-01 | Disposition: A | Discharge status: Home / Self Care | Payer: Self-pay | Source: Ambulatory Visit | Attending: Cardiovascular Disease | Admitting: Cardiovascular Disease

## 2015-12-03 ENCOUNTER — Encounter (HOSPITAL_COMMUNITY)
Admission: RE | Admit: 2015-12-03 | Discharge: 2015-12-03 | Disposition: A | Discharge status: Home / Self Care | Payer: Self-pay | Source: Ambulatory Visit | Attending: Cardiovascular Disease | Admitting: Cardiovascular Disease

## 2015-12-05 ENCOUNTER — Encounter (HOSPITAL_COMMUNITY)
Admission: RE | Admit: 2015-12-05 | Discharge: 2015-12-05 | Disposition: A | Discharge status: Home / Self Care | Payer: Self-pay | Source: Ambulatory Visit | Attending: Cardiovascular Disease | Admitting: Cardiovascular Disease

## 2015-12-08 ENCOUNTER — Encounter (HOSPITAL_COMMUNITY)
Admission: RE | Admit: 2015-12-08 | Discharge: 2015-12-08 | Disposition: A | Discharge status: Home / Self Care | Payer: Self-pay | Source: Ambulatory Visit | Attending: Cardiovascular Disease | Admitting: Cardiovascular Disease

## 2015-12-10 ENCOUNTER — Encounter (HOSPITAL_COMMUNITY)
Admission: RE | Admit: 2015-12-10 | Discharge: 2015-12-10 | Disposition: A | Discharge status: Home / Self Care | Payer: Self-pay | Source: Ambulatory Visit | Attending: Cardiovascular Disease | Admitting: Cardiovascular Disease

## 2015-12-12 ENCOUNTER — Encounter (HOSPITAL_COMMUNITY)
Admission: RE | Admit: 2015-12-12 | Discharge: 2015-12-12 | Disposition: A | Discharge status: Home / Self Care | Payer: Self-pay | Source: Ambulatory Visit | Attending: Cardiovascular Disease | Admitting: Cardiovascular Disease

## 2015-12-15 ENCOUNTER — Encounter (HOSPITAL_COMMUNITY)
Admission: RE | Admit: 2015-12-15 | Discharge: 2015-12-15 | Disposition: A | Discharge status: Home / Self Care | Payer: Self-pay | Source: Ambulatory Visit | Attending: Cardiovascular Disease | Admitting: Cardiovascular Disease

## 2015-12-17 ENCOUNTER — Encounter (HOSPITAL_COMMUNITY)
Admission: RE | Admit: 2015-12-17 | Discharge: 2015-12-17 | Disposition: A | Discharge status: Home / Self Care | Payer: Self-pay | Source: Ambulatory Visit | Attending: Cardiovascular Disease | Admitting: Cardiovascular Disease

## 2015-12-19 ENCOUNTER — Encounter (HOSPITAL_COMMUNITY): Payer: Self-pay

## 2015-12-22 ENCOUNTER — Encounter (HOSPITAL_COMMUNITY)
Admission: RE | Admit: 2015-12-22 | Discharge: 2015-12-22 | Disposition: A | Discharge status: Home / Self Care | Payer: Self-pay | Source: Ambulatory Visit | Attending: Cardiovascular Disease | Admitting: Cardiovascular Disease

## 2015-12-24 ENCOUNTER — Encounter (HOSPITAL_COMMUNITY)
Admission: RE | Admit: 2015-12-24 | Discharge: 2015-12-24 | Disposition: A | Discharge status: Home / Self Care | Payer: Self-pay | Source: Ambulatory Visit | Attending: Cardiovascular Disease | Admitting: Cardiovascular Disease

## 2015-12-26 ENCOUNTER — Encounter (HOSPITAL_COMMUNITY)
Admission: RE | Admit: 2015-12-26 | Discharge: 2015-12-26 | Disposition: A | Discharge status: Home / Self Care | Payer: Self-pay | Source: Ambulatory Visit | Attending: Cardiovascular Disease | Admitting: Cardiovascular Disease

## 2015-12-29 ENCOUNTER — Encounter (HOSPITAL_COMMUNITY)
Admission: RE | Admit: 2015-12-29 | Discharge: 2015-12-29 | Disposition: A | Discharge status: Home / Self Care | Payer: Self-pay | Source: Ambulatory Visit | Attending: Cardiovascular Disease | Admitting: Cardiovascular Disease

## 2015-12-29 DIAGNOSIS — Z955 Presence of coronary angioplasty implant and graft: Secondary | ICD-10-CM | POA: Insufficient documentation

## 2015-12-29 DIAGNOSIS — I252 Old myocardial infarction: Secondary | ICD-10-CM | POA: Insufficient documentation

## 2015-12-31 ENCOUNTER — Encounter (HOSPITAL_COMMUNITY)
Admission: RE | Admit: 2015-12-31 | Discharge: 2015-12-31 | Disposition: A | Discharge status: Home / Self Care | Payer: Self-pay | Source: Ambulatory Visit | Attending: Cardiovascular Disease | Admitting: Cardiovascular Disease

## 2016-01-02 ENCOUNTER — Encounter (HOSPITAL_COMMUNITY)
Admission: RE | Admit: 2016-01-02 | Discharge: 2016-01-02 | Disposition: A | Discharge status: Home / Self Care | Payer: Self-pay | Source: Ambulatory Visit | Attending: Cardiovascular Disease | Admitting: Cardiovascular Disease

## 2016-01-05 ENCOUNTER — Encounter (HOSPITAL_COMMUNITY)
Admission: RE | Admit: 2016-01-05 | Discharge: 2016-01-05 | Disposition: A | Discharge status: Home / Self Care | Payer: Self-pay | Source: Ambulatory Visit | Attending: Cardiovascular Disease | Admitting: Cardiovascular Disease

## 2016-01-07 ENCOUNTER — Encounter (HOSPITAL_COMMUNITY)
Admission: RE | Admit: 2016-01-07 | Discharge: 2016-01-07 | Disposition: A | Discharge status: Home / Self Care | Payer: Self-pay | Source: Ambulatory Visit | Attending: Cardiovascular Disease | Admitting: Cardiovascular Disease

## 2016-01-09 ENCOUNTER — Encounter (HOSPITAL_COMMUNITY)
Admission: RE | Admit: 2016-01-09 | Discharge: 2016-01-09 | Disposition: A | Discharge status: Home / Self Care | Payer: Self-pay | Source: Ambulatory Visit | Attending: Cardiovascular Disease | Admitting: Cardiovascular Disease

## 2016-01-12 ENCOUNTER — Encounter (HOSPITAL_COMMUNITY)
Admission: RE | Admit: 2016-01-12 | Discharge: 2016-01-12 | Disposition: A | Discharge status: Home / Self Care | Payer: Self-pay | Source: Ambulatory Visit | Attending: Cardiovascular Disease | Admitting: Cardiovascular Disease

## 2016-01-14 ENCOUNTER — Encounter (HOSPITAL_COMMUNITY)
Admission: RE | Admit: 2016-01-14 | Discharge: 2016-01-14 | Disposition: A | Discharge status: Home / Self Care | Payer: Self-pay | Source: Ambulatory Visit | Attending: Cardiovascular Disease | Admitting: Cardiovascular Disease

## 2016-01-16 ENCOUNTER — Encounter (HOSPITAL_COMMUNITY)
Admission: RE | Admit: 2016-01-16 | Discharge: 2016-01-16 | Disposition: A | Discharge status: Home / Self Care | Payer: Self-pay | Source: Ambulatory Visit | Attending: Cardiovascular Disease | Admitting: Cardiovascular Disease

## 2016-01-19 ENCOUNTER — Encounter (HOSPITAL_COMMUNITY)
Admission: RE | Admit: 2016-01-19 | Discharge: 2016-01-19 | Disposition: A | Discharge status: Home / Self Care | Payer: Self-pay | Source: Ambulatory Visit | Attending: Cardiovascular Disease | Admitting: Cardiovascular Disease

## 2016-01-21 ENCOUNTER — Encounter (HOSPITAL_COMMUNITY)
Admission: RE | Admit: 2016-01-21 | Discharge: 2016-01-21 | Disposition: A | Discharge status: Home / Self Care | Payer: Self-pay | Source: Ambulatory Visit | Attending: Cardiovascular Disease | Admitting: Cardiovascular Disease

## 2016-01-23 ENCOUNTER — Encounter (HOSPITAL_COMMUNITY)
Admission: RE | Admit: 2016-01-23 | Discharge: 2016-01-23 | Disposition: A | Discharge status: Home / Self Care | Payer: Self-pay | Source: Ambulatory Visit | Attending: Cardiovascular Disease | Admitting: Cardiovascular Disease

## 2016-01-23 ENCOUNTER — Telehealth: Payer: Self-pay | Admitting: Neurology

## 2016-01-23 NOTE — Telephone Encounter (Signed)
I received blood work results from the primary care physician. The blood work was done on 01/15/2016. Carbamazepine level is 9.5, CBC is normal with a white count of 6.0, hemoglobin of 15.0, platelets of 197. A chemistry profile shows a normal BUN/creatinine of 10 and 0.8 respectively. Sodium is 137, potassium 4.4, chloride 104. Total protein 6.3, albumin of 3.8. Liver profile is unremarkable. A cholesterol panel shows an LDL level of 77. TSH of 1.15, PSA of 0.712, hemoglobin A1c of 6.9.

## 2016-01-26 ENCOUNTER — Encounter (HOSPITAL_COMMUNITY)
Admission: RE | Admit: 2016-01-26 | Discharge: 2016-01-26 | Disposition: A | Discharge status: Home / Self Care | Payer: Self-pay | Source: Ambulatory Visit | Attending: Cardiovascular Disease | Admitting: Cardiovascular Disease

## 2016-01-28 ENCOUNTER — Encounter (HOSPITAL_COMMUNITY)
Admission: RE | Admit: 2016-01-28 | Discharge: 2016-01-28 | Disposition: A | Discharge status: Home / Self Care | Payer: Self-pay | Source: Ambulatory Visit | Attending: Cardiovascular Disease | Admitting: Cardiovascular Disease

## 2016-01-28 DIAGNOSIS — Z955 Presence of coronary angioplasty implant and graft: Secondary | ICD-10-CM | POA: Insufficient documentation

## 2016-01-28 DIAGNOSIS — I252 Old myocardial infarction: Secondary | ICD-10-CM | POA: Insufficient documentation

## 2016-01-30 ENCOUNTER — Encounter (HOSPITAL_COMMUNITY)
Admission: RE | Admit: 2016-01-30 | Discharge: 2016-01-30 | Disposition: A | Discharge status: Home / Self Care | Payer: Self-pay | Source: Ambulatory Visit | Attending: Cardiovascular Disease | Admitting: Cardiovascular Disease

## 2016-02-02 ENCOUNTER — Encounter (HOSPITAL_COMMUNITY)
Admission: RE | Admit: 2016-02-02 | Discharge: 2016-02-02 | Disposition: A | Discharge status: Home / Self Care | Payer: Self-pay | Source: Ambulatory Visit | Attending: Cardiovascular Disease | Admitting: Cardiovascular Disease

## 2016-02-04 ENCOUNTER — Encounter (HOSPITAL_COMMUNITY)
Admission: RE | Admit: 2016-02-04 | Discharge: 2016-02-04 | Disposition: A | Discharge status: Home / Self Care | Payer: Self-pay | Source: Ambulatory Visit | Attending: Cardiovascular Disease | Admitting: Cardiovascular Disease

## 2016-02-05 ENCOUNTER — Encounter: Payer: Self-pay | Admitting: Cardiovascular Disease

## 2016-02-06 ENCOUNTER — Encounter (HOSPITAL_COMMUNITY)
Admission: RE | Admit: 2016-02-06 | Discharge: 2016-02-06 | Disposition: A | Discharge status: Home / Self Care | Payer: Self-pay | Source: Ambulatory Visit | Attending: Cardiovascular Disease | Admitting: Cardiovascular Disease

## 2016-02-09 ENCOUNTER — Encounter (HOSPITAL_COMMUNITY)
Admission: RE | Admit: 2016-02-09 | Discharge: 2016-02-09 | Disposition: A | Discharge status: Home / Self Care | Payer: Self-pay | Source: Ambulatory Visit | Attending: Cardiovascular Disease | Admitting: Cardiovascular Disease

## 2016-02-11 ENCOUNTER — Encounter (HOSPITAL_COMMUNITY)
Admission: RE | Admit: 2016-02-11 | Discharge: 2016-02-11 | Disposition: A | Discharge status: Home / Self Care | Payer: Self-pay | Source: Ambulatory Visit | Attending: Cardiovascular Disease | Admitting: Cardiovascular Disease

## 2016-02-13 ENCOUNTER — Encounter (HOSPITAL_COMMUNITY)
Admission: RE | Admit: 2016-02-13 | Discharge: 2016-02-13 | Disposition: A | Discharge status: Home / Self Care | Payer: Self-pay | Source: Ambulatory Visit | Attending: Cardiovascular Disease | Admitting: Cardiovascular Disease

## 2016-02-15 ENCOUNTER — Other Ambulatory Visit: Payer: Self-pay | Admitting: Physician Assistant

## 2016-02-16 ENCOUNTER — Encounter (HOSPITAL_COMMUNITY)
Admission: RE | Admit: 2016-02-16 | Discharge: 2016-02-16 | Disposition: A | Discharge status: Home / Self Care | Payer: Self-pay | Source: Ambulatory Visit | Attending: Cardiovascular Disease | Admitting: Cardiovascular Disease

## 2016-02-18 ENCOUNTER — Encounter (HOSPITAL_COMMUNITY)
Admission: RE | Admit: 2016-02-18 | Discharge: 2016-02-18 | Disposition: A | Discharge status: Home / Self Care | Payer: Self-pay | Source: Ambulatory Visit | Attending: Cardiovascular Disease | Admitting: Cardiovascular Disease

## 2016-02-20 ENCOUNTER — Encounter (HOSPITAL_COMMUNITY)
Admission: RE | Admit: 2016-02-20 | Discharge: 2016-02-20 | Disposition: A | Discharge status: Home / Self Care | Payer: Self-pay | Source: Ambulatory Visit | Attending: Cardiovascular Disease | Admitting: Cardiovascular Disease

## 2016-02-23 ENCOUNTER — Encounter (HOSPITAL_COMMUNITY)
Admission: RE | Admit: 2016-02-23 | Discharge: 2016-02-23 | Disposition: A | Discharge status: Home / Self Care | Payer: Self-pay | Source: Ambulatory Visit | Attending: Cardiovascular Disease | Admitting: Cardiovascular Disease

## 2016-02-25 ENCOUNTER — Encounter (HOSPITAL_COMMUNITY)
Admission: RE | Admit: 2016-02-25 | Discharge: 2016-02-25 | Disposition: A | Discharge status: Home / Self Care | Payer: Self-pay | Source: Ambulatory Visit | Attending: Cardiovascular Disease | Admitting: Cardiovascular Disease

## 2016-02-27 ENCOUNTER — Encounter (HOSPITAL_COMMUNITY)
Admission: RE | Admit: 2016-02-27 | Discharge: 2016-02-27 | Disposition: A | Discharge status: Home / Self Care | Payer: Self-pay | Source: Ambulatory Visit | Attending: Cardiovascular Disease | Admitting: Cardiovascular Disease

## 2016-02-27 DIAGNOSIS — Z955 Presence of coronary angioplasty implant and graft: Secondary | ICD-10-CM | POA: Insufficient documentation

## 2016-02-27 DIAGNOSIS — I252 Old myocardial infarction: Secondary | ICD-10-CM | POA: Insufficient documentation

## 2016-03-03 ENCOUNTER — Encounter (HOSPITAL_COMMUNITY)
Admission: RE | Admit: 2016-03-03 | Discharge: 2016-03-03 | Disposition: A | Discharge status: Home / Self Care | Payer: Self-pay | Source: Ambulatory Visit | Attending: Cardiovascular Disease | Admitting: Cardiovascular Disease

## 2016-03-05 ENCOUNTER — Encounter (HOSPITAL_COMMUNITY)
Admission: RE | Admit: 2016-03-05 | Discharge: 2016-03-05 | Disposition: A | Discharge status: Home / Self Care | Payer: Self-pay | Source: Ambulatory Visit | Attending: Cardiovascular Disease | Admitting: Cardiovascular Disease

## 2016-03-08 ENCOUNTER — Encounter (HOSPITAL_COMMUNITY)
Admission: RE | Admit: 2016-03-08 | Discharge: 2016-03-08 | Disposition: A | Discharge status: Home / Self Care | Payer: Self-pay | Source: Ambulatory Visit | Attending: Cardiovascular Disease | Admitting: Cardiovascular Disease

## 2016-03-09 ENCOUNTER — Encounter (HOSPITAL_COMMUNITY)
Admission: RE | Admit: 2016-03-09 | Discharge: 2016-03-09 | Disposition: A | Discharge status: Home / Self Care | Payer: Self-pay | Source: Ambulatory Visit | Attending: Cardiovascular Disease | Admitting: Cardiovascular Disease

## 2016-03-10 ENCOUNTER — Encounter (HOSPITAL_COMMUNITY)
Admission: RE | Admit: 2016-03-10 | Discharge: 2016-03-10 | Disposition: A | Discharge status: Home / Self Care | Payer: Self-pay | Source: Ambulatory Visit | Attending: Cardiovascular Disease | Admitting: Cardiovascular Disease

## 2016-03-12 ENCOUNTER — Encounter (HOSPITAL_COMMUNITY)
Admission: RE | Admit: 2016-03-12 | Discharge: 2016-03-12 | Disposition: A | Discharge status: Home / Self Care | Payer: Self-pay | Source: Ambulatory Visit | Attending: Cardiovascular Disease | Admitting: Cardiovascular Disease

## 2016-03-15 ENCOUNTER — Encounter (HOSPITAL_COMMUNITY)
Admission: RE | Admit: 2016-03-15 | Discharge: 2016-03-15 | Disposition: A | Discharge status: Home / Self Care | Payer: Self-pay | Source: Ambulatory Visit | Attending: Cardiovascular Disease | Admitting: Cardiovascular Disease

## 2016-03-17 ENCOUNTER — Encounter (HOSPITAL_COMMUNITY)
Admission: RE | Admit: 2016-03-17 | Discharge: 2016-03-17 | Disposition: A | Discharge status: Home / Self Care | Payer: Self-pay | Source: Ambulatory Visit | Attending: Cardiovascular Disease | Admitting: Cardiovascular Disease

## 2016-03-19 ENCOUNTER — Encounter (HOSPITAL_COMMUNITY)
Admission: RE | Admit: 2016-03-19 | Discharge: 2016-03-19 | Disposition: A | Discharge status: Home / Self Care | Payer: Self-pay | Source: Ambulatory Visit | Attending: Cardiovascular Disease | Admitting: Cardiovascular Disease

## 2016-03-22 ENCOUNTER — Encounter (HOSPITAL_COMMUNITY)
Admission: RE | Admit: 2016-03-22 | Discharge: 2016-03-22 | Disposition: A | Discharge status: Home / Self Care | Payer: Self-pay | Source: Ambulatory Visit | Attending: Cardiovascular Disease | Admitting: Cardiovascular Disease

## 2016-03-24 ENCOUNTER — Encounter (HOSPITAL_COMMUNITY)
Admission: RE | Admit: 2016-03-24 | Discharge: 2016-03-24 | Disposition: A | Discharge status: Home / Self Care | Payer: Self-pay | Source: Ambulatory Visit | Attending: Cardiovascular Disease | Admitting: Cardiovascular Disease

## 2016-03-26 ENCOUNTER — Encounter (HOSPITAL_COMMUNITY)
Admission: RE | Admit: 2016-03-26 | Discharge: 2016-03-26 | Disposition: A | Discharge status: Home / Self Care | Payer: Self-pay | Source: Ambulatory Visit | Attending: Cardiovascular Disease | Admitting: Cardiovascular Disease

## 2016-03-29 ENCOUNTER — Encounter (HOSPITAL_COMMUNITY): Payer: Self-pay

## 2016-03-29 DIAGNOSIS — Z955 Presence of coronary angioplasty implant and graft: Secondary | ICD-10-CM | POA: Insufficient documentation

## 2016-03-29 DIAGNOSIS — I252 Old myocardial infarction: Secondary | ICD-10-CM | POA: Insufficient documentation

## 2016-03-31 ENCOUNTER — Encounter (HOSPITAL_COMMUNITY)
Admission: RE | Admit: 2016-03-31 | Discharge: 2016-03-31 | Disposition: A | Discharge status: Home / Self Care | Payer: Self-pay | Source: Ambulatory Visit | Attending: Cardiovascular Disease | Admitting: Cardiovascular Disease

## 2016-04-01 ENCOUNTER — Encounter (HOSPITAL_COMMUNITY)
Admission: RE | Admit: 2016-04-01 | Discharge: 2016-04-01 | Disposition: A | Discharge status: Home / Self Care | Payer: Self-pay | Source: Ambulatory Visit | Attending: Cardiovascular Disease | Admitting: Cardiovascular Disease

## 2016-04-02 ENCOUNTER — Encounter (HOSPITAL_COMMUNITY)
Admission: RE | Admit: 2016-04-02 | Discharge: 2016-04-02 | Disposition: A | Discharge status: Home / Self Care | Payer: Self-pay | Source: Ambulatory Visit | Attending: Cardiovascular Disease | Admitting: Cardiovascular Disease

## 2016-04-05 ENCOUNTER — Encounter (HOSPITAL_COMMUNITY): Payer: Self-pay

## 2016-04-06 ENCOUNTER — Encounter (HOSPITAL_COMMUNITY)
Admission: RE | Admit: 2016-04-06 | Discharge: 2016-04-06 | Disposition: A | Discharge status: Home / Self Care | Payer: Self-pay | Source: Ambulatory Visit | Attending: Cardiovascular Disease | Admitting: Cardiovascular Disease

## 2016-04-07 ENCOUNTER — Encounter (HOSPITAL_COMMUNITY)
Admission: RE | Admit: 2016-04-07 | Discharge: 2016-04-07 | Disposition: A | Discharge status: Home / Self Care | Payer: Self-pay | Source: Ambulatory Visit | Attending: Cardiovascular Disease | Admitting: Cardiovascular Disease

## 2016-04-09 ENCOUNTER — Encounter (HOSPITAL_COMMUNITY)
Admission: RE | Admit: 2016-04-09 | Discharge: 2016-04-09 | Disposition: A | Discharge status: Home / Self Care | Payer: Self-pay | Source: Ambulatory Visit | Attending: Cardiovascular Disease | Admitting: Cardiovascular Disease

## 2016-04-12 ENCOUNTER — Encounter (HOSPITAL_COMMUNITY)
Admission: RE | Admit: 2016-04-12 | Discharge: 2016-04-12 | Disposition: A | Discharge status: Home / Self Care | Payer: Self-pay | Source: Ambulatory Visit | Attending: Cardiovascular Disease | Admitting: Cardiovascular Disease

## 2016-04-14 ENCOUNTER — Encounter (HOSPITAL_COMMUNITY): Payer: Self-pay

## 2016-04-16 ENCOUNTER — Encounter (HOSPITAL_COMMUNITY): Payer: Self-pay

## 2016-04-19 ENCOUNTER — Encounter (HOSPITAL_COMMUNITY)
Admission: RE | Admit: 2016-04-19 | Discharge: 2016-04-19 | Disposition: A | Discharge status: Home / Self Care | Payer: Self-pay | Source: Ambulatory Visit | Attending: Cardiovascular Disease | Admitting: Cardiovascular Disease

## 2016-04-21 ENCOUNTER — Encounter (HOSPITAL_COMMUNITY)
Admission: RE | Admit: 2016-04-21 | Discharge: 2016-04-21 | Disposition: A | Discharge status: Home / Self Care | Payer: Self-pay | Source: Ambulatory Visit | Attending: Cardiovascular Disease | Admitting: Cardiovascular Disease

## 2016-04-22 ENCOUNTER — Other Ambulatory Visit: Payer: Self-pay | Admitting: Cardiovascular Disease

## 2016-04-22 DIAGNOSIS — I25719 Atherosclerosis of autologous vein coronary artery bypass graft(s) with unspecified angina pectoris: Secondary | ICD-10-CM

## 2016-04-22 DIAGNOSIS — I10 Essential (primary) hypertension: Secondary | ICD-10-CM

## 2016-04-23 ENCOUNTER — Encounter (HOSPITAL_COMMUNITY)
Admission: RE | Admit: 2016-04-23 | Discharge: 2016-04-23 | Disposition: A | Discharge status: Home / Self Care | Payer: Self-pay | Source: Ambulatory Visit | Attending: Cardiovascular Disease | Admitting: Cardiovascular Disease

## 2016-04-26 ENCOUNTER — Encounter (HOSPITAL_COMMUNITY)
Admission: RE | Admit: 2016-04-26 | Discharge: 2016-04-26 | Disposition: A | Discharge status: Home / Self Care | Payer: Self-pay | Source: Ambulatory Visit | Attending: Cardiovascular Disease | Admitting: Cardiovascular Disease

## 2016-04-28 ENCOUNTER — Encounter (HOSPITAL_COMMUNITY)
Admission: RE | Admit: 2016-04-28 | Discharge: 2016-04-28 | Disposition: A | Discharge status: Home / Self Care | Payer: Self-pay | Source: Ambulatory Visit | Attending: Cardiovascular Disease | Admitting: Cardiovascular Disease

## 2016-04-28 DIAGNOSIS — Z955 Presence of coronary angioplasty implant and graft: Secondary | ICD-10-CM | POA: Insufficient documentation

## 2016-04-28 DIAGNOSIS — I252 Old myocardial infarction: Secondary | ICD-10-CM | POA: Insufficient documentation

## 2016-04-30 ENCOUNTER — Encounter (HOSPITAL_COMMUNITY)
Admission: RE | Admit: 2016-04-30 | Discharge: 2016-04-30 | Disposition: A | Discharge status: Home / Self Care | Payer: Self-pay | Source: Ambulatory Visit | Attending: Cardiovascular Disease | Admitting: Cardiovascular Disease

## 2016-05-03 ENCOUNTER — Encounter (HOSPITAL_COMMUNITY): Payer: Self-pay

## 2016-05-03 ENCOUNTER — Ambulatory Visit (INDEPENDENT_AMBULATORY_CARE_PROVIDER_SITE_OTHER): Payer: Medicare Other | Admitting: Cardiovascular Disease

## 2016-05-03 ENCOUNTER — Encounter: Payer: Self-pay | Admitting: Cardiovascular Disease

## 2016-05-03 VITALS — BP 100/60 | HR 74 | Ht 71.0 in | Wt 237.0 lb

## 2016-05-03 DIAGNOSIS — I10 Essential (primary) hypertension: Secondary | ICD-10-CM

## 2016-05-03 DIAGNOSIS — E785 Hyperlipidemia, unspecified: Secondary | ICD-10-CM

## 2016-05-03 DIAGNOSIS — I25719 Atherosclerosis of autologous vein coronary artery bypass graft(s) with unspecified angina pectoris: Secondary | ICD-10-CM | POA: Diagnosis not present

## 2016-05-03 MED ORDER — METOPROLOL TARTRATE 25 MG PO TABS: 25.0000 mg | ORAL_TABLET | Freq: Two times a day (BID) | ORAL | 11 refills | Status: DC

## 2016-05-03 NOTE — Progress Notes (Signed)
Cardiology Office Note Date:  05/03/2016   ID:  Jeff GrumblingRichard N [name], DOB 05/01/1966, MRN [id number]  PCP:  Ezequiel KayserPERINI,MARK A, MD  Cardiologist:  [name], Ephriam Turman, MD    Chief Complaint  Patient presents with  . Coronary Artery Disease   History of Present Illness: Jeff [name] is a 50 y.o. male who presents for follow-up evaluation.   The patient is followed for coronary artery disease with history of CABG in 2011 and early bypass graft failure. He was treated with multivessel PCI following CABG (Native LAD and SVG-diagonal graft were stented, all other grafts occluded). He presented in August 2014 with worsening chest discomfort and underwent cardiac catheterization. This showed continued patency of his stent sites and continued patency of his diagonal vein graft. There was a pattern of diffuse small vessel disease noted and medical therapy was continued. LV function has been normal. He has participated in the maintenance phase of cardiac rehab now for several years.  He is doing well. The patient is here alone today. He denies any recent episodes of chest pain, chest pressure, shortness of breath, lightheadedness, or heart palpitations. He continues to at all with fatigue on an intermittent basis. Some days are better than others.  Past Medical History:  Diagnosis Date  . Anxiety   . CAD (coronary artery disease)    Cath 2011.  LAD stent patent, distal LAD occlusion, D1 100%, OM branch 80 - 90%, SVG to diag patent, LIMA to LAD occluded, SVG to RCA occluded, SVG to OM was occluded.   No change from previous cath.  Managed medically.   . Cataract   . Depression   . Diabetes mellitus type I (HCC)    on insulin pump at  home  . Diabetic retinopathy    s/p vitrectomy and history of retinal surgery  . Hemorrhoid   . History of oxygen administration    nocutural use only at 2 l/m nasally.  [name]. HTN (hypertension)   . Hyperlipidemia   . Myocardial infarction   . 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 Seasonal allergic rhinitis   . Seizure disorder (HCC)   . Sleep apnea    s/p oral surgery  . Toe fracture, right    11-23-14 fracture right big toe-wearing a stability shoe    Past Surgical History:  Procedure Laterality Date  . APPENDECTOMY    . CARDIAC CATHETERIZATION    . CATARACT EXTRACTION, BILATERAL    . CORONARY ANGIOPLASTY     9'11,  CABPG grafts had failed.  . CORONARY ARTERY BYPASS GRAFT     4 vessel 4/11  . ESOPHAGOGASTRODUODENOSCOPY (EGD) WITH PROPOFOL N/A 12/05/2014   Procedure: ESOPHAGOGASTRODUODENOSCOPY (EGD) WITH PROPOFOL;  Surgeon: [name], MD;  Location: [facility];  Service: Endoscopy;  Laterality: N/A;  . LAPAROSCOPIC APPENDECTOMY  11/24/2011  . LEFT HEART CATHETERIZATION WITH CORONARY/GRAFT ANGIOGRAM N/A 02/22/2013   Procedure: LEFT HEART CATHETERIZATION WITH Isabel CapriceORONARY/GRAFT ANGIOGRAM;  Surgeon: Tonny BollmanMichael Amoura Ransier, MD;  [facility];  Service: Cardiovascular;  Laterality: N/A;  . release of right transverse carpal ligament    . right eye vitrectomy and detached retina repair  10/2007  . TONSILLECTOMY    . uvulopalatoplasty surgery      Current Outpatient Prescriptions  Medication Sig Dispense Refill  . acetaminophen (TYLENOL) 500 MG tablet Take 1,000 mg by mouth every 6 (six) hours as needed for mild pain.    [name]. aspirin 81 MG tablet  Take 81 mg by mouth daily.     . carbamazepine (TEGRETOL XR) 200 MG 12 hr tablet Take 3 tablets (600 mg total) by mouth 2 (two) times daily. Brand is medically necessary. 180 tablet 11  . Cholecalciferol (VITAMIN D) 2000 units CAPS Take by mouth.    . desvenlafaxine (PRISTIQ) 100 MG 24 hr tablet Take 100 mg by mouth daily.    [name] docusate sodium (COLACE) 100 MG capsule Take 100 mg by mouth daily.     [name] ezetimibe (ZETIA) 10 MG tablet Take 10 mg by mouth at bedtime.     [name] HUMALOG 100 UNIT/ML injection   0  . Insulin Human (INSULIN PUMP) 100 unit/ml SOLN Inject into the skin 3  (three) times daily before meals. Add 8-11 units per insulin pump 3 times daily before meals per bolus scale.    . isosorbide mononitrate (IMDUR) 60 MG 24 hr tablet Take 60 mg by mouth every morning.     . levETIRAcetam (KEPPRA) 500 MG tablet Take 250 mg every morning and 500 mg every evening. 45 tablet 11  . lisinopril (PRINIVIL,ZESTRIL) 5 MG tablet Take 5 mg by mouth every morning.     . metoprolol tartrate (LOPRESSOR) 25 MG tablet take 1 tablet by mouth twice a day 60 tablet 1  . multivitamin (ONE-A-DAY MEN'S) TABS Take 1 tablet by mouth daily.     . nitroGLYCERIN (NITROSTAT) 0.4 MG SL tablet place 1 tablet under the tongue if needed every 5 minutes for chest pain 25 tablet 2  . omeprazole (PRILOSEC) 20 MG capsule Take 1 capsule (20 mg total) by mouth daily. 30 capsule 11  . ONE TOUCH ULTRA TEST test strip   0  . ONETOUCH DELICA LANCETS 33G MISC USE TO SELF MONITOR BLOOD GLUCOSE UP TO 7 TIMES PER DAY  0  . OXYGEN Place 2 L into the nose at bedtime.    . rosuvastatin (CRESTOR) 40 MG tablet Take 40 mg by mouth at bedtime.     . vitamin B-12 (CYANOCOBALAMIN) 500 MCG tablet Take 500 mcg by mouth daily.     No current facility-administered medications for this visit.     Allergies:   Latex   Social History:  The patient  reports that he has never smoked. He has never used smokeless tobacco. He reports that he does not drink alcohol or use drugs.   Family History:  The patient's  family history includes Arthritis in his sister; Coronary artery disease (age of onset: 64) in his father; Diabetes in his father; Hypertension in his father; Squamous cell carcinoma in his brother.   ROS:  Please see the history of present illness.  Otherwise, review of systems is positive for hearing loss, visual disturbance, blood in stool, depression, snoring, anxiety.  All other systems are reviewed and negative.   PHYSICAL EXAM: VS:  BP 100/60 (BP Location: Right Arm, Patient Position: Sitting, Cuff Size:  Normal)   Pulse 74   Ht 5\' 11"  (1.803 m)   Wt 107.5 kg (237 lb)   BMI 33.05 kg/m  , BMI Body mass index is 33.05 kg/m. GEN: Well nourished, well developed, in no acute distress  HEENT: normal  Neck: no JVD, no masses. No carotid bruits Cardiac: RRR without murmur or gallop                Respiratory:  clear to auscultation bilaterally, normal work of breathing GI: soft, nontender, nondistended, + BS MS: no deformity or atrophy  Ext:  no pretibial edema, pedal pulses 2+= bilaterally Skin: warm and dry, no rash Neuro:  Strength and sensation are intact Psych: euthymic mood, full affect  EKG:  EKG is ordered today. The ekg ordered today shows NSR 74 bpm, rightward axis, otherwise within normal limits - no change from previous tracings  Recent Labs: 10/28/2015: ALT 27; BUN 12; Creatinine, Ser 0.74; Platelets 181; Potassium 5.1; Sodium 140   Lipid Panel     Component Value Date/Time   CHOL 138 06/03/2011 0640   TRIG 111 06/03/2011 0640   HDL 42 06/03/2011 0640   CHOLHDL 3.3 06/03/2011 0640   VLDL 22 06/03/2011 0640   LDLCALC 74 06/03/2011 0640      Wt Readings from Last 3 Encounters:  05/03/16 107.5 kg (237 lb)  11/14/15 135.4 kg (298 lb 6.4 oz)  10/28/15 108.6 kg (239 lb 8 oz)     Cardiac Studies Reviewed: Myocardial Perfusion Scan 06-17-2015: Study Highlights    Nuclear stress EF: 69%.  There was no ST segment deviation noted during stress.  The study is normal.  This is a low risk study.   ASSESSMENT AND PLAN: 1.  CAD, native vessel, with CCS Class 2 angina: remains remarkably stable on his medical program. Nuclear stress test from December 2016 reviewed and demonstrates normal LV function and normal myocardial perfusion. Last PCI procedure was in 2011.    2. Essential HTN: BP remains well-controlled. Continue lisinopril and metoprolol.  3. Hyperlipidemia: Labs from 01/21/2016 reviewed from Dr Perini's office. LDL 77 mg/dL. Continues on a combination of  high-dose crestor and zetia.  Current medicines are reviewed with the patient today.  The patient does not have concerns regarding medicines.  Labs/ tests ordered today include:  No orders of the defined types were placed in this encounter.   Disposition:   FU   SignedTonny Bollman, MD  05/03/2016 10:21 AM    [facility] [address] [place], [place], [place]  [address] Phone: [phone]; Fax: [phone]

## 2016-05-03 NOTE — Patient Instructions (Signed)

## 2016-05-04 ENCOUNTER — Encounter (HOSPITAL_COMMUNITY)
Admission: RE | Admit: 2016-05-04 | Discharge: 2016-05-04 | Disposition: A | Discharge status: Home / Self Care | Payer: Self-pay | Source: Ambulatory Visit | Attending: Cardiovascular Disease | Admitting: Cardiovascular Disease

## 2016-05-05 ENCOUNTER — Encounter (HOSPITAL_COMMUNITY)
Admission: RE | Admit: 2016-05-05 | Discharge: 2016-05-05 | Disposition: A | Discharge status: Home / Self Care | Payer: Self-pay | Source: Ambulatory Visit | Attending: Cardiovascular Disease | Admitting: Cardiovascular Disease

## 2016-05-07 ENCOUNTER — Encounter (HOSPITAL_COMMUNITY)
Admission: RE | Admit: 2016-05-07 | Discharge: 2016-05-07 | Disposition: A | Discharge status: Home / Self Care | Payer: Self-pay | Source: Ambulatory Visit | Attending: Cardiovascular Disease | Admitting: Cardiovascular Disease

## 2016-05-10 ENCOUNTER — Encounter (HOSPITAL_COMMUNITY)
Admission: RE | Admit: 2016-05-10 | Discharge: 2016-05-10 | Disposition: A | Discharge status: Home / Self Care | Payer: Self-pay | Source: Ambulatory Visit | Attending: Cardiovascular Disease | Admitting: Cardiovascular Disease

## 2016-05-12 ENCOUNTER — Encounter (HOSPITAL_COMMUNITY)
Admission: RE | Admit: 2016-05-12 | Discharge: 2016-05-12 | Disposition: A | Discharge status: Home / Self Care | Payer: Self-pay | Source: Ambulatory Visit | Attending: Cardiovascular Disease | Admitting: Cardiovascular Disease

## 2016-05-14 ENCOUNTER — Encounter (HOSPITAL_COMMUNITY): Payer: Self-pay

## 2016-05-17 ENCOUNTER — Encounter (HOSPITAL_COMMUNITY)
Admission: RE | Admit: 2016-05-17 | Discharge: 2016-05-17 | Disposition: A | Discharge status: Home / Self Care | Payer: Self-pay | Source: Ambulatory Visit | Attending: Cardiovascular Disease | Admitting: Cardiovascular Disease

## 2016-05-19 ENCOUNTER — Encounter (HOSPITAL_COMMUNITY)
Admission: RE | Admit: 2016-05-19 | Discharge: 2016-05-19 | Disposition: A | Discharge status: Home / Self Care | Payer: Self-pay | Source: Ambulatory Visit | Attending: Cardiovascular Disease | Admitting: Cardiovascular Disease

## 2016-05-24 ENCOUNTER — Encounter (HOSPITAL_COMMUNITY)
Admission: RE | Admit: 2016-05-24 | Discharge: 2016-05-24 | Disposition: A | Discharge status: Home / Self Care | Payer: Self-pay | Source: Ambulatory Visit | Attending: Cardiovascular Disease | Admitting: Cardiovascular Disease

## 2016-05-26 ENCOUNTER — Emergency Department (HOSPITAL_COMMUNITY): Payer: Medicare Other

## 2016-05-26 ENCOUNTER — Encounter (HOSPITAL_COMMUNITY)
Admission: RE | Admit: 2016-05-26 | Discharge: 2016-05-26 | Disposition: A | Discharge status: Home / Self Care | Payer: Self-pay | Source: Ambulatory Visit | Attending: Cardiovascular Disease | Admitting: Cardiovascular Disease

## 2016-05-26 ENCOUNTER — Other Ambulatory Visit: Payer: Self-pay

## 2016-05-26 ENCOUNTER — Encounter (HOSPITAL_COMMUNITY): Payer: Self-pay

## 2016-05-26 ENCOUNTER — Emergency Department (HOSPITAL_COMMUNITY)
Admission: EM | Admit: 2016-05-26 | Discharge: 2016-05-27 | Disposition: A | Discharge status: Home / Self Care | Payer: Medicare Other | Attending: Emergency Medicine | Admitting: Emergency Medicine

## 2016-05-26 ENCOUNTER — Telehealth: Payer: Self-pay | Admitting: Pediatrics

## 2016-05-26 DIAGNOSIS — Z951 Presence of aortocoronary bypass graft: Secondary | ICD-10-CM | POA: Diagnosis not present

## 2016-05-26 DIAGNOSIS — Z794 Long term (current) use of insulin: Secondary | ICD-10-CM | POA: Diagnosis not present

## 2016-05-26 DIAGNOSIS — Z91040 Latex allergy status: Secondary | ICD-10-CM | POA: Diagnosis not present

## 2016-05-26 DIAGNOSIS — R079 Chest pain, unspecified: Secondary | ICD-10-CM

## 2016-05-26 DIAGNOSIS — Z7982 Long term (current) use of aspirin: Secondary | ICD-10-CM | POA: Insufficient documentation

## 2016-05-26 DIAGNOSIS — Z79899 Other long term (current) drug therapy: Secondary | ICD-10-CM | POA: Insufficient documentation

## 2016-05-26 DIAGNOSIS — I2510 Atherosclerotic heart disease of native coronary artery without angina pectoris: Secondary | ICD-10-CM | POA: Insufficient documentation

## 2016-05-26 DIAGNOSIS — E10319 Type 1 diabetes mellitus with unspecified diabetic retinopathy without macular edema: Secondary | ICD-10-CM | POA: Insufficient documentation

## 2016-05-26 DIAGNOSIS — I10 Essential (primary) hypertension: Secondary | ICD-10-CM | POA: Insufficient documentation

## 2016-05-26 DIAGNOSIS — I252 Old myocardial infarction: Secondary | ICD-10-CM | POA: Diagnosis not present

## 2016-05-26 LAB — BASIC METABOLIC PANEL WITH GFR
Anion gap: 6 (ref 5–15)
BUN: 11 mg/dL (ref 6–20)
CO2: 28 mmol/L (ref 22–32)
Calcium: 9.4 mg/dL (ref 8.9–10.3)
Chloride: 105 mmol/L (ref 101–111)
Creatinine, Ser: 0.81 mg/dL (ref 0.61–1.24)
GFR calc Af Amer: >60 mL/min
GFR calc non Af Amer: >60 mL/min
Glucose, Bld: 112 mg/dL — ABNORMAL HIGH (ref 65–99)
Potassium: 5.2 mmol/L — ABNORMAL HIGH (ref 3.5–5.1)
Sodium: 139 mmol/L (ref 135–145)

## 2016-05-26 LAB — CBC
HCT: 44.2 % (ref 39.0–52.0)
Hemoglobin: 15.6 g/dL (ref 13.0–17.0)
MCH: 31.5 pg (ref 26.0–34.0)
MCHC: 35.3 g/dL (ref 30.0–36.0)
MCV: 89.1 fL (ref 78.0–100.0)
Platelets: 192 K/uL (ref 150–400)
RBC: 4.96 MIL/uL (ref 4.22–5.81)
RDW: 12.6 % (ref 11.5–15.5)
WBC: 5.8 K/uL (ref 4.0–10.5)

## 2016-05-26 LAB — I-STAT TROPONIN, ED
TROPONIN I, POC: 0 ng/mL (ref 0.00–0.08)
Troponin i, poc: 0 ng/mL (ref 0.00–0.08)

## 2016-05-26 LAB — CBG MONITORING, ED: Glucose-Capillary: 94 mg/dL (ref 65–99)

## 2016-05-26 NOTE — ED Notes (Signed)
Pt requested wait time and asked why the wait was so long. Delay explained to pt. Pt became irritated with the delay.

## 2016-05-26 NOTE — ED Notes (Signed)
Pt checked blood sugar with personal glucometer. Pt reported CBG of 77. Scarlett RN notified.

## 2016-05-26 NOTE — ED Triage Notes (Signed)
Patient states that he developed SSCP following cardiac rehab today. States that the pain moved to back and on arrival to triage states pain is mild to none. No shortness of breath, no other associated symptoms. NAD. EKG done on arrival.

## 2016-05-26 NOTE — Discharge Instructions (Signed)
Please call Dr. Earmon Phoenix office tomorrow to make him aware of this event today. Please follow-up as directed by him. If you develop chest pain again, take your nitroglycerin as prescribed. Please return to emergency department if you develop any new or worsening symptoms.

## 2016-05-26 NOTE — ED Notes (Signed)
Per Scarlett RN, Cheree Ditto crackers, peanut butter, and ginger ale provided to increase CBG

## 2016-05-26 NOTE — Telephone Encounter (Signed)
Joann From Cardiac Rehab states pt in office today for appt, did fine and felt fine when he left.  She reports when he got to the car he started having chest pain radiating to jaw and down arm.  She states patient is fine now and would like to be seen here today.  I told her patient needs to be seen in ED if having chest pain. She reports pt denies chest pain at this time and feels fine. I told her I had opening, but would have to speak with APP.  I heard the patient in the background, he did not sound comfortable with coming to the office instead of the emergency room. I advised Chyrl Civatte to send patient to ED immediately for evaluation. She voiced understanding and agreed with plan.

## 2016-05-26 NOTE — ED Provider Notes (Signed)
[facility]-EMERGENCY DEPT Provider Note   CSN: [id number] Arrival date & time: 05/26/16  1525     History   Chief Complaint Chief Complaint  Patient presents with  . Chest Pain    HPI Jeff [name] is a 50 y.o. male with history of CAD, MI, failed CABG 4 with LAD stent PCI, type 1 diabetes, neurofibromatosis who presents following an episode of chest pain that occurred as he finished cardiac rehabilitation today. Patient described the chest pain as left sided. Patient reported that the pain radiated to his neck and right ear. The pain was not pleuritic. Patient reports the episode lasted around 5 minutes. He had some associated nausea, but no vomiting. Patient is pain-free at this time, around 6 hours later. Patient denies any shortness of breath, abdominal pain, urinary symptoms, new leg pain or swelling. Patient was seen by Dr. Excell Seltzer, his cardiologist, in the beginning of November and had clear checkup. Patient had a physical by his PCP yesterday and all was well. Patient did not take any nitroglycerin for his pain today.  HPI  Past Medical History:  Diagnosis Date  . Anxiety   . CAD (coronary artery disease)    Cath 2011.  LAD stent patent, distal LAD occlusion, D1 100%, OM branch 80 - 90%, SVG to diag patent, LIMA to LAD occluded, SVG to RCA occluded, SVG to OM was occluded.   No change from previous cath.  Managed medically.   . Cataract   . Depression   . Diabetes mellitus type I (HCC)    on insulin pump at  home  . Diabetic retinopathy    s/p vitrectomy and history of retinal surgery  . Hemorrhoid   . History of oxygen administration    nocutural use only at 2 l/m nasally.  [name] HTN (hypertension)   . Hyperlipidemia   . Myocardial infarction   . 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 Seasonal allergic rhinitis   . Seizure disorder (HCC)   . Sleep apnea    s/p oral surgery  . Toe fracture, right    11-23-14 fracture right big toe-wearing  a stability shoe    Patient Active Problem List   Diagnosis Date Noted  . Odynophagia 11/28/2014  . 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 HTN (hypertension) 03/31/2014  . Partial epilepsy with impairment of consciousness (HCC) 04/24/2013  . Neurofibromatosis, 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HCC) 04/24/2013  . Pain in limb 04/24/2013  . Encounter for therapeutic drug monitoring 04/24/2013  . Postop check 12/08/2011  . Leg pain, left 12/17/2010  . HYPERLIPIDEMIA-MIXED 11/28/2009  . ACUT MI SUBENDOCARDIAL INFARCT SUBSQT EPIS CARE 11/28/2009  . CORONARY ATHEROSLERO AUTOL VEIN BYPASS GRAFT 11/28/2009    Past Surgical History:  Procedure Laterality Date  . APPENDECTOMY    . CARDIAC CATHETERIZATION    . CATARACT EXTRACTION, BILATERAL    . CORONARY ANGIOPLASTY     9'11,  CABPG grafts had failed.  . CORONARY ARTERY BYPASS GRAFT     4 vessel 4/11  . ESOPHAGOGASTRODUODENOSCOPY (EGD) WITH PROPOFOL N/A 12/05/2014   Procedure: ESOPHAGOGASTRODUODENOSCOPY (EGD) WITH PROPOFOL;  Surgeon: [name], MD;  Location: [facility];  Service: Endoscopy;  Laterality: N/A;  . LAPAROSCOPIC APPENDECTOMY  11/24/2011  . LEFT HEART CATHETERIZATION WITH CORONARY/GRAFT ANGIOGRAM N/A 02/22/2013   Procedure: LEFT HEART CATHETERIZATION WITH [name];  Surgeon: Tonny Bollman, MD;  Location: [facility] CATH LAB;  Service: Cardiovascular;  Laterality:  N/A;  . release of right transverse carpal ligament    . right eye vitrectomy and detached retina repair  10/2007  . TONSILLECTOMY    . uvulopalatoplasty surgery         Home Medications    Prior to Admission medications   Medication Sig Start Date End Date Taking? Authorizing Provider  acetaminophen (TYLENOL) 500 MG tablet Take 1,000 mg by mouth every 6 (six) hours as needed for mild pain.   Yes Historical Provider, MD  aspirin 81 MG tablet Take 81 mg by mouth daily.    Yes Historical Provider, MD  carbamazepine (TEGRETOL XR) 200  MG 12 hr tablet Take 3 tablets (600 mg total) by mouth 2 (two) times daily. Brand is medically necessary. 10/28/15  Yes York Spaniel, MD  Cholecalciferol (VITAMIN D) 2000 units CAPS Take 2,000 Units by mouth daily.    Yes Historical Provider, MD  desvenlafaxine (PRISTIQ) 100 MG 24 hr tablet Take 100 mg by mouth daily. 04/03/16  Yes Historical Provider, MD  docusate sodium (COLACE) 100 MG capsule Take 100 mg by mouth daily.    Yes Historical Provider, MD  ezetimibe (ZETIA) 10 MG tablet Take 10 mg by mouth at bedtime.    Yes Historical Provider, MD  Insulin Human (INSULIN PUMP) 100 unit/ml SOLN Inject 1 each into the skin continuous. "humalog"   Yes Historical Provider, MD  isosorbide mononitrate (IMDUR) 60 MG 24 hr tablet Take 60 mg by mouth every morning.    Yes Historical Provider, MD  levETIRAcetam (KEPPRA) 500 MG tablet Take 250 mg every morning and 500 mg every evening. 10/28/15  Yes York Spaniel, MD  lisinopril (PRINIVIL,ZESTRIL) 5 MG tablet Take 5 mg by mouth every morning.    Yes Historical Provider, MD  metoprolol tartrate (LOPRESSOR) 25 MG tablet Take 1 tablet (25 mg total) by mouth 2 (two) times daily. 05/03/16  Yes Tonny Bollman, MD  multivitamin (ONE-A-DAY MEN'S) TABS Take 1 tablet by mouth daily.    Yes Historical Provider, MD  nitroGLYCERIN (NITROSTAT) 0.4 MG SL tablet place 1 tablet under the tongue if needed every 5 minutes for chest pain 06/13/15  Yes Tonny Bollman, MD  omeprazole (PRILOSEC) 20 MG capsule Take 1 capsule (20 mg total) by mouth daily. 02/16/16  Yes [name], MD  OXYGEN Place 2 L into the nose at bedtime.   Yes Historical Provider, MD  rosuvastatin (CRESTOR) 40 MG tablet Take 40 mg by mouth at bedtime.    Yes Historical Provider, MD  vitamin B-12 (CYANOCOBALAMIN) 500 MCG tablet Take 500 mcg by mouth daily.   Yes Historical Provider, MD    Family History Family History  Problem Relation Age of Onset  . Coronary artery disease Father 2  . Diabetes Father      type II  . Hypertension Father   . Squamous cell carcinoma Brother   . Arthritis Sister     Social History Social History  Substance Use Topics  . Smoking status: Never Smoker  . Smokeless tobacco: Never Used  . Alcohol use No     Allergies   Latex   Review of Systems Review of Systems  Constitutional: Negative for chills and fever.  HENT: Negative for facial swelling and sore throat.   Respiratory: Negative for shortness of breath.   Cardiovascular: Positive for chest pain.  Gastrointestinal: Positive for nausea. Negative for abdominal pain and vomiting.  Genitourinary: Negative for dysuria.  Musculoskeletal: Negative for back pain.  Skin: Negative for  rash and wound.  Neurological: Negative for headaches.  Psychiatric/Behavioral: The patient is not nervous/anxious.      Physical Exam Updated Vital Signs BP 160/88   Pulse 85   Temp 98.1 F (36.7 C) (Oral)   Resp 16   Ht 5\' 11"  (1.803 m)   Wt 107.4 kg   SpO2 99%   BMI 33.02 kg/m   Physical Exam  Constitutional: He appears well-developed and well-nourished. No distress.  HENT:  Head: Normocephalic and atraumatic.  Mouth/Throat: Oropharynx is clear and moist. No oropharyngeal exudate.  Eyes: Conjunctivae are normal. Pupils are equal, round, and reactive to light. Right eye exhibits no discharge. Left eye exhibits no discharge. No scleral icterus.  Neck: Normal range of motion. Neck supple. No thyromegaly present.  Cardiovascular: Normal rate, regular rhythm, normal heart sounds and intact distal pulses.  Exam reveals no gallop and no friction rub.   No murmur heard. Pulmonary/Chest: Effort normal and breath sounds normal. No stridor. No respiratory distress. He has no wheezes. He has no rales. He exhibits no tenderness.  Abdominal: Soft. Bowel sounds are normal. He exhibits no distension. There is no tenderness. There is no rebound and no guarding.  Musculoskeletal: He exhibits no edema.  Lymphadenopathy:      He has no cervical adenopathy.  Neurological: He is alert. Coordination normal.  Skin: Skin is warm and dry. No rash noted. He is not diaphoretic. No pallor.  Psychiatric: He has a normal mood and affect.  Nursing note and vitals reviewed.    ED Treatments / Results  Labs (all labs ordered are listed, but only abnormal results are displayed) Labs Reviewed  BASIC METABOLIC PANEL - Abnormal; Notable for the following:       Result Value   Potassium 5.2 (*)    Glucose, Bld 112 (*)    All other components within normal limits  CBC  I-STAT TROPOININ, ED  I-STAT TROPOININ, ED  CBG MONITORING, ED    EKG  EKG Interpretation  Date/Time:  Wednesday May 26 2016 15:31:07 EST Ventricular Rate:  90 PR Interval:  174 QRS Duration: 100 QT Interval:  352 QTC Calculation: 430 R Axis:   121 Text Interpretation:  Normal sinus rhythm Incomplete right bundle branch block Possible Right ventricular hypertrophy Abnormal ekg Confirmed by Gerhard MunchLOCKWOOD, ROBERT  MD [phone]) on 05/26/2016 9:41:57 PM       Radiology Dg Chest 2 View  Result Date: 05/26/2016 CLINICAL DATA:  Mid chest pain radiating to posterior neck and right ear. Symptoms occurred after cardiac rehab dilatation appointment today during the cool down position. History of CABG in 2011 EXAM: CHEST  2 VIEW COMPARISON:  Portable chest x-ray dated January 18, 2014 FINDINGS: The lungs are adequately inflated. There is no focal infiltrate. There is no pleural effusion or pneumothorax there is mild stable pleural thickening along the lateral thoracic wall on the left. The heart and pulmonary vascularity are normal. The sternal wires are intact. The retrosternal soft tissues are normal. There is faint calcification in the wall of the aortic arch. The bony thorax exhibits no acute abnormality. IMPRESSION: There is no CHF, pneumonia, nor other acute cardiopulmonary abnormality. Electronically Signed   By: David  SwazilandJordan M.D.   On: 05/26/2016 16:22     Procedures Procedures (including critical care time)  Medications Ordered in ED Medications - No data to display   Initial Impression / Assessment and Plan / ED Course  I have reviewed the triage vital signs and the nursing notes.  Pertinent labs & imaging results that were available during my care of the patient were reviewed by me and considered in my medical decision making (see chart for details).  Clinical Course     Patient with most likely angina. CBC unremarkable. BMP shows potassium 5.2, glucose 112. Delta troponin negative. CXR shows no CHF, pneumonia or other acute cardiopulmonary abnormality. EKG shows NSR, incomplete right bundle block, possible RVH. I spoke with cardiologist, Dr. Vonzella Nipple, who advised the patient could be discharged home with his current antianginal medications, including sublingual nitroglycerin. Patient to follow-up with Dr. Excell Seltzer by calling his office tomorrow. Return precautions discussed. Patient understands and agrees with plan. Patient vitals stable and patient asymptomatic throughout ED course and discharged in satisfactory condition.  Final Clinical Impressions(s) / ED Diagnoses   Final diagnoses:  Chest pain, unspecified type    New Prescriptions Discharge Medication List as of 05/26/2016 11:47 PM       Emi Holes, PA-C 05/27/16 0018    Gerhard Munch, MD 05/27/16 [phone]

## 2016-05-27 ENCOUNTER — Telehealth: Payer: Self-pay | Admitting: Cardiovascular Disease

## 2016-05-27 NOTE — Telephone Encounter (Signed)
Will forward message to Dr Excell Seltzer to review the pt's ER notes and make recommendations if needed.

## 2016-05-27 NOTE — Telephone Encounter (Signed)
Mr.Saxton is calling because he was in the E/R on last night and the D/C instructions told him to call Dr. Excell Seltzer to find out what instructions he may have for him . Please call

## 2016-05-27 NOTE — Telephone Encounter (Signed)
I spoke with the pt and made him aware of Dr Earmon Phoenix comments.  The pt denies any further symptoms since his ER visit yesterday.  At this time the pt will contact the office if he has any further symptoms. The pt was advised that per Dr Excell Seltzer he can return to cardiac rehab.

## 2016-05-27 NOTE — Telephone Encounter (Signed)
Reviewed notes, EKG, labs - all reassuring. If he truly had only 5 minutes of chest discomfort as reported, he can simply monitor symptoms and return to cardiac rehab. If recurrent symptoms he will need re-evaluation with a pharmacologic stress test.

## 2016-05-28 ENCOUNTER — Encounter (HOSPITAL_COMMUNITY)
Admission: RE | Admit: 2016-05-28 | Discharge: 2016-05-28 | Disposition: A | Discharge status: Home / Self Care | Payer: Self-pay | Source: Ambulatory Visit | Attending: Cardiovascular Disease | Admitting: Cardiovascular Disease

## 2016-05-28 DIAGNOSIS — Z955 Presence of coronary angioplasty implant and graft: Secondary | ICD-10-CM | POA: Insufficient documentation

## 2016-05-28 DIAGNOSIS — I252 Old myocardial infarction: Secondary | ICD-10-CM | POA: Insufficient documentation

## 2016-05-31 ENCOUNTER — Encounter (HOSPITAL_COMMUNITY)
Admission: RE | Admit: 2016-05-31 | Discharge: 2016-05-31 | Disposition: A | Discharge status: Home / Self Care | Payer: Self-pay | Source: Ambulatory Visit | Attending: Cardiovascular Disease | Admitting: Cardiovascular Disease

## 2016-06-02 ENCOUNTER — Encounter (HOSPITAL_COMMUNITY)
Admission: RE | Admit: 2016-06-02 | Discharge: 2016-06-02 | Disposition: A | Discharge status: Home / Self Care | Payer: Self-pay | Source: Ambulatory Visit | Attending: Cardiovascular Disease | Admitting: Cardiovascular Disease

## 2016-06-04 ENCOUNTER — Encounter (HOSPITAL_COMMUNITY)
Admission: RE | Admit: 2016-06-04 | Discharge: 2016-06-04 | Disposition: A | Discharge status: Home / Self Care | Payer: Self-pay | Source: Ambulatory Visit | Attending: Cardiovascular Disease | Admitting: Cardiovascular Disease

## 2016-06-07 ENCOUNTER — Encounter (HOSPITAL_COMMUNITY)
Admission: RE | Admit: 2016-06-07 | Discharge: 2016-06-07 | Disposition: A | Discharge status: Home / Self Care | Payer: Self-pay | Source: Ambulatory Visit | Attending: Cardiovascular Disease | Admitting: Cardiovascular Disease

## 2016-06-09 ENCOUNTER — Encounter (HOSPITAL_COMMUNITY)
Admission: RE | Admit: 2016-06-09 | Discharge: 2016-06-09 | Disposition: A | Discharge status: Home / Self Care | Payer: Self-pay | Source: Ambulatory Visit | Attending: Cardiovascular Disease | Admitting: Cardiovascular Disease

## 2016-06-11 ENCOUNTER — Encounter (HOSPITAL_COMMUNITY)
Admission: RE | Admit: 2016-06-11 | Discharge: 2016-06-11 | Disposition: A | Discharge status: Home / Self Care | Payer: Self-pay | Source: Ambulatory Visit | Attending: Cardiovascular Disease | Admitting: Cardiovascular Disease

## 2016-06-14 ENCOUNTER — Encounter (HOSPITAL_COMMUNITY)
Admission: RE | Admit: 2016-06-14 | Discharge: 2016-06-14 | Disposition: A | Discharge status: Home / Self Care | Payer: Self-pay | Source: Ambulatory Visit | Attending: Cardiovascular Disease | Admitting: Cardiovascular Disease

## 2016-06-16 ENCOUNTER — Encounter (HOSPITAL_COMMUNITY)
Admission: RE | Admit: 2016-06-16 | Discharge: 2016-06-16 | Disposition: A | Discharge status: Home / Self Care | Payer: Self-pay | Source: Ambulatory Visit | Attending: Cardiovascular Disease | Admitting: Cardiovascular Disease

## 2016-06-18 ENCOUNTER — Encounter (HOSPITAL_COMMUNITY)
Admission: RE | Admit: 2016-06-18 | Discharge: 2016-06-18 | Disposition: A | Discharge status: Home / Self Care | Payer: Self-pay | Source: Ambulatory Visit | Attending: Cardiovascular Disease | Admitting: Cardiovascular Disease

## 2016-06-23 ENCOUNTER — Encounter (HOSPITAL_COMMUNITY)
Admission: RE | Admit: 2016-06-23 | Discharge: 2016-06-23 | Disposition: A | Discharge status: Home / Self Care | Payer: Self-pay | Source: Ambulatory Visit | Attending: Cardiovascular Disease | Admitting: Cardiovascular Disease

## 2016-06-24 ENCOUNTER — Other Ambulatory Visit: Payer: Self-pay | Admitting: Cardiovascular Disease

## 2016-06-24 DIAGNOSIS — I10 Essential (primary) hypertension: Secondary | ICD-10-CM

## 2016-06-24 DIAGNOSIS — I25719 Atherosclerosis of autologous vein coronary artery bypass graft(s) with unspecified angina pectoris: Secondary | ICD-10-CM

## 2016-06-25 ENCOUNTER — Encounter (HOSPITAL_COMMUNITY)
Admission: RE | Admit: 2016-06-25 | Discharge: 2016-06-25 | Disposition: A | Discharge status: Home / Self Care | Payer: Self-pay | Source: Ambulatory Visit | Attending: Cardiovascular Disease | Admitting: Cardiovascular Disease

## 2016-06-30 ENCOUNTER — Encounter (HOSPITAL_COMMUNITY)
Admission: RE | Admit: 2016-06-30 | Discharge: 2016-06-30 | Disposition: A | Discharge status: Home / Self Care | Payer: Self-pay | Source: Ambulatory Visit | Attending: Cardiovascular Disease | Admitting: Cardiovascular Disease

## 2016-06-30 DIAGNOSIS — I252 Old myocardial infarction: Secondary | ICD-10-CM | POA: Insufficient documentation

## 2016-07-01 ENCOUNTER — Encounter (HOSPITAL_COMMUNITY): Payer: Medicare Other

## 2016-07-02 ENCOUNTER — Encounter (HOSPITAL_COMMUNITY): Payer: Medicare Other

## 2016-07-05 ENCOUNTER — Encounter (HOSPITAL_COMMUNITY)
Admission: RE | Admit: 2016-07-05 | Discharge: 2016-07-05 | Disposition: A | Discharge status: Home / Self Care | Payer: Self-pay | Source: Ambulatory Visit | Attending: Cardiovascular Disease | Admitting: Cardiovascular Disease

## 2016-07-06 ENCOUNTER — Encounter (HOSPITAL_COMMUNITY): Payer: Medicare Other

## 2016-07-07 ENCOUNTER — Encounter (HOSPITAL_COMMUNITY)
Admission: RE | Admit: 2016-07-07 | Discharge: 2016-07-07 | Disposition: A | Discharge status: Home / Self Care | Payer: Self-pay | Source: Ambulatory Visit | Attending: Cardiovascular Disease | Admitting: Cardiovascular Disease

## 2016-07-08 ENCOUNTER — Encounter (HOSPITAL_COMMUNITY): Payer: Medicare Other

## 2016-07-09 ENCOUNTER — Encounter (HOSPITAL_COMMUNITY)
Admission: RE | Admit: 2016-07-09 | Discharge: 2016-07-09 | Disposition: A | Discharge status: Home / Self Care | Payer: Self-pay | Source: Ambulatory Visit | Attending: Cardiovascular Disease | Admitting: Cardiovascular Disease

## 2016-07-12 ENCOUNTER — Encounter (HOSPITAL_COMMUNITY)
Admission: RE | Admit: 2016-07-12 | Discharge: 2016-07-12 | Disposition: A | Discharge status: Home / Self Care | Payer: Self-pay | Source: Ambulatory Visit | Attending: Cardiovascular Disease | Admitting: Cardiovascular Disease

## 2016-07-13 ENCOUNTER — Encounter (HOSPITAL_COMMUNITY): Payer: Medicare Other

## 2016-07-14 ENCOUNTER — Encounter (HOSPITAL_COMMUNITY): Payer: Medicare Other

## 2016-07-15 ENCOUNTER — Encounter (HOSPITAL_COMMUNITY): Payer: Medicare Other

## 2016-07-16 ENCOUNTER — Encounter (HOSPITAL_COMMUNITY)
Admission: RE | Admit: 2016-07-16 | Discharge: 2016-07-16 | Disposition: A | Discharge status: Home / Self Care | Payer: Self-pay | Source: Ambulatory Visit | Attending: Cardiovascular Disease | Admitting: Cardiovascular Disease

## 2016-07-19 ENCOUNTER — Encounter (HOSPITAL_COMMUNITY)
Admission: RE | Admit: 2016-07-19 | Discharge: 2016-07-19 | Disposition: A | Discharge status: Home / Self Care | Payer: Self-pay | Source: Ambulatory Visit | Attending: Cardiovascular Disease | Admitting: Cardiovascular Disease

## 2016-07-20 ENCOUNTER — Encounter (HOSPITAL_COMMUNITY): Payer: Medicare Other

## 2016-07-21 ENCOUNTER — Encounter (HOSPITAL_COMMUNITY)
Admission: RE | Admit: 2016-07-21 | Discharge: 2016-07-21 | Disposition: A | Discharge status: Home / Self Care | Payer: Self-pay | Source: Ambulatory Visit | Attending: Cardiovascular Disease | Admitting: Cardiovascular Disease

## 2016-07-22 ENCOUNTER — Encounter (HOSPITAL_COMMUNITY): Payer: Medicare Other

## 2016-07-23 ENCOUNTER — Encounter (HOSPITAL_COMMUNITY)
Admission: RE | Admit: 2016-07-23 | Discharge: 2016-07-23 | Disposition: A | Discharge status: Home / Self Care | Payer: Self-pay | Source: Ambulatory Visit | Attending: Cardiovascular Disease | Admitting: Cardiovascular Disease

## 2016-07-26 ENCOUNTER — Encounter (HOSPITAL_COMMUNITY): Payer: Medicare Other

## 2016-07-27 ENCOUNTER — Encounter (HOSPITAL_COMMUNITY): Payer: Medicare Other

## 2016-07-28 ENCOUNTER — Encounter (HOSPITAL_COMMUNITY)
Admission: RE | Admit: 2016-07-28 | Discharge: 2016-07-28 | Disposition: A | Discharge status: Home / Self Care | Payer: Self-pay | Source: Ambulatory Visit | Attending: Cardiovascular Disease | Admitting: Cardiovascular Disease

## 2016-07-30 ENCOUNTER — Encounter (HOSPITAL_COMMUNITY)
Admission: RE | Admit: 2016-07-30 | Discharge: 2016-07-30 | Disposition: A | Discharge status: Home / Self Care | Payer: Self-pay | Source: Ambulatory Visit | Attending: Cardiovascular Disease | Admitting: Cardiovascular Disease

## 2016-07-30 DIAGNOSIS — I214 Non-ST elevation (NSTEMI) myocardial infarction: Secondary | ICD-10-CM | POA: Insufficient documentation

## 2016-08-02 ENCOUNTER — Encounter (HOSPITAL_COMMUNITY)
Admission: RE | Admit: 2016-08-02 | Discharge: 2016-08-02 | Disposition: A | Discharge status: Home / Self Care | Payer: Self-pay | Source: Ambulatory Visit | Attending: Cardiovascular Disease | Admitting: Cardiovascular Disease

## 2016-08-04 ENCOUNTER — Encounter (HOSPITAL_COMMUNITY): Payer: Self-pay

## 2016-08-06 ENCOUNTER — Encounter (HOSPITAL_COMMUNITY)
Admission: RE | Admit: 2016-08-06 | Discharge: 2016-08-06 | Disposition: A | Discharge status: Home / Self Care | Payer: Self-pay | Source: Ambulatory Visit | Attending: Cardiovascular Disease | Admitting: Cardiovascular Disease

## 2016-08-09 ENCOUNTER — Encounter (HOSPITAL_COMMUNITY)
Admission: RE | Admit: 2016-08-09 | Discharge: 2016-08-09 | Disposition: A | Discharge status: Home / Self Care | Payer: Self-pay | Source: Ambulatory Visit | Attending: Cardiovascular Disease | Admitting: Cardiovascular Disease

## 2016-08-11 ENCOUNTER — Encounter (HOSPITAL_COMMUNITY)
Admission: RE | Admit: 2016-08-11 | Discharge: 2016-08-11 | Disposition: A | Discharge status: Home / Self Care | Payer: Self-pay | Source: Ambulatory Visit | Attending: Cardiovascular Disease | Admitting: Cardiovascular Disease

## 2016-08-13 ENCOUNTER — Encounter (HOSPITAL_COMMUNITY)
Admission: RE | Admit: 2016-08-13 | Discharge: 2016-08-13 | Disposition: A | Discharge status: Home / Self Care | Payer: Self-pay | Source: Ambulatory Visit | Attending: Cardiovascular Disease | Admitting: Cardiovascular Disease

## 2016-08-16 ENCOUNTER — Encounter (HOSPITAL_COMMUNITY)
Admission: RE | Admit: 2016-08-16 | Discharge: 2016-08-16 | Disposition: A | Discharge status: Home / Self Care | Payer: Self-pay | Source: Ambulatory Visit | Attending: Cardiovascular Disease | Admitting: Cardiovascular Disease

## 2016-08-18 ENCOUNTER — Encounter (HOSPITAL_COMMUNITY): Payer: Self-pay

## 2016-08-20 ENCOUNTER — Encounter (HOSPITAL_COMMUNITY)
Admission: RE | Admit: 2016-08-20 | Discharge: 2016-08-20 | Disposition: A | Discharge status: Home / Self Care | Payer: Self-pay | Source: Ambulatory Visit | Attending: Cardiovascular Disease | Admitting: Cardiovascular Disease

## 2016-08-23 ENCOUNTER — Encounter (HOSPITAL_COMMUNITY)
Admission: RE | Admit: 2016-08-23 | Discharge: 2016-08-23 | Disposition: A | Discharge status: Home / Self Care | Payer: Self-pay | Source: Ambulatory Visit | Attending: Cardiovascular Disease | Admitting: Cardiovascular Disease

## 2016-08-25 ENCOUNTER — Encounter (HOSPITAL_COMMUNITY)
Admission: RE | Admit: 2016-08-25 | Discharge: 2016-08-25 | Disposition: A | Discharge status: Home / Self Care | Payer: Self-pay | Source: Ambulatory Visit | Attending: Cardiovascular Disease | Admitting: Cardiovascular Disease

## 2016-08-27 ENCOUNTER — Encounter (HOSPITAL_COMMUNITY): Payer: Self-pay

## 2016-08-27 DIAGNOSIS — I214 Non-ST elevation (NSTEMI) myocardial infarction: Secondary | ICD-10-CM | POA: Insufficient documentation

## 2016-08-30 ENCOUNTER — Encounter (HOSPITAL_COMMUNITY): Payer: Self-pay

## 2016-09-01 ENCOUNTER — Encounter (HOSPITAL_COMMUNITY)
Admission: RE | Admit: 2016-09-01 | Discharge: 2016-09-01 | Disposition: A | Discharge status: Home / Self Care | Payer: Self-pay | Source: Ambulatory Visit | Attending: Cardiovascular Disease | Admitting: Cardiovascular Disease

## 2016-09-03 ENCOUNTER — Encounter (HOSPITAL_COMMUNITY)
Admission: RE | Admit: 2016-09-03 | Discharge: 2016-09-03 | Disposition: A | Discharge status: Home / Self Care | Payer: Self-pay | Source: Ambulatory Visit | Attending: Cardiovascular Disease | Admitting: Cardiovascular Disease

## 2016-09-06 ENCOUNTER — Encounter (HOSPITAL_COMMUNITY): Payer: Self-pay

## 2016-09-08 ENCOUNTER — Encounter (HOSPITAL_COMMUNITY)
Admission: RE | Admit: 2016-09-08 | Discharge: 2016-09-08 | Disposition: A | Discharge status: Home / Self Care | Payer: Self-pay | Source: Ambulatory Visit | Attending: Cardiovascular Disease | Admitting: Cardiovascular Disease

## 2016-09-10 ENCOUNTER — Encounter (HOSPITAL_COMMUNITY)
Admission: RE | Admit: 2016-09-10 | Discharge: 2016-09-10 | Disposition: A | Discharge status: Home / Self Care | Payer: Self-pay | Source: Ambulatory Visit | Attending: Cardiovascular Disease | Admitting: Cardiovascular Disease

## 2016-09-13 ENCOUNTER — Encounter (HOSPITAL_COMMUNITY)
Admission: RE | Admit: 2016-09-13 | Discharge: 2016-09-13 | Disposition: A | Discharge status: Home / Self Care | Payer: Self-pay | Source: Ambulatory Visit | Attending: Cardiovascular Disease | Admitting: Cardiovascular Disease

## 2016-09-15 ENCOUNTER — Encounter (HOSPITAL_COMMUNITY)
Admission: RE | Admit: 2016-09-15 | Discharge: 2016-09-15 | Disposition: A | Discharge status: Home / Self Care | Payer: Self-pay | Source: Ambulatory Visit | Attending: Cardiovascular Disease | Admitting: Cardiovascular Disease

## 2016-09-17 ENCOUNTER — Encounter (HOSPITAL_COMMUNITY)
Admission: RE | Admit: 2016-09-17 | Discharge: 2016-09-17 | Disposition: A | Discharge status: Home / Self Care | Payer: Self-pay | Source: Ambulatory Visit | Attending: Cardiovascular Disease | Admitting: Cardiovascular Disease

## 2016-09-20 ENCOUNTER — Encounter (HOSPITAL_COMMUNITY)
Admission: RE | Admit: 2016-09-20 | Discharge: 2016-09-20 | Disposition: A | Discharge status: Home / Self Care | Payer: Self-pay | Source: Ambulatory Visit | Attending: Cardiovascular Disease | Admitting: Cardiovascular Disease

## 2016-09-22 ENCOUNTER — Encounter (HOSPITAL_COMMUNITY)
Admission: RE | Admit: 2016-09-22 | Discharge: 2016-09-22 | Disposition: A | Discharge status: Home / Self Care | Payer: Self-pay | Source: Ambulatory Visit | Attending: Cardiovascular Disease | Admitting: Cardiovascular Disease

## 2016-09-24 ENCOUNTER — Encounter (HOSPITAL_COMMUNITY)
Admission: RE | Admit: 2016-09-24 | Discharge: 2016-09-24 | Disposition: A | Discharge status: Home / Self Care | Payer: Self-pay | Source: Ambulatory Visit | Attending: Cardiovascular Disease | Admitting: Cardiovascular Disease

## 2016-09-27 ENCOUNTER — Encounter (HOSPITAL_COMMUNITY)
Admission: RE | Admit: 2016-09-27 | Discharge: 2016-09-27 | Disposition: A | Discharge status: Home / Self Care | Payer: Self-pay | Source: Ambulatory Visit | Attending: Cardiovascular Disease | Admitting: Cardiovascular Disease

## 2016-09-27 DIAGNOSIS — I214 Non-ST elevation (NSTEMI) myocardial infarction: Secondary | ICD-10-CM | POA: Insufficient documentation

## 2016-09-29 ENCOUNTER — Encounter (HOSPITAL_COMMUNITY)
Admission: RE | Admit: 2016-09-29 | Discharge: 2016-09-29 | Disposition: A | Discharge status: Home / Self Care | Payer: Self-pay | Source: Ambulatory Visit | Attending: Cardiovascular Disease | Admitting: Cardiovascular Disease

## 2016-10-01 ENCOUNTER — Encounter (HOSPITAL_COMMUNITY)
Admission: RE | Admit: 2016-10-01 | Discharge: 2016-10-01 | Disposition: A | Discharge status: Home / Self Care | Payer: Self-pay | Source: Ambulatory Visit | Attending: Cardiovascular Disease | Admitting: Cardiovascular Disease

## 2016-10-04 ENCOUNTER — Encounter (HOSPITAL_COMMUNITY)
Admission: RE | Admit: 2016-10-04 | Discharge: 2016-10-04 | Disposition: A | Discharge status: Home / Self Care | Payer: Self-pay | Source: Ambulatory Visit | Attending: Cardiovascular Disease | Admitting: Cardiovascular Disease

## 2016-10-06 ENCOUNTER — Encounter (HOSPITAL_COMMUNITY)
Admission: RE | Admit: 2016-10-06 | Discharge: 2016-10-06 | Disposition: A | Discharge status: Home / Self Care | Payer: Self-pay | Source: Ambulatory Visit | Attending: Cardiovascular Disease | Admitting: Cardiovascular Disease

## 2016-10-08 ENCOUNTER — Encounter (HOSPITAL_COMMUNITY)
Admission: RE | Admit: 2016-10-08 | Discharge: 2016-10-08 | Disposition: A | Discharge status: Home / Self Care | Payer: Self-pay | Source: Ambulatory Visit | Attending: Cardiovascular Disease | Admitting: Cardiovascular Disease

## 2016-10-11 ENCOUNTER — Encounter (HOSPITAL_COMMUNITY)
Admission: RE | Admit: 2016-10-11 | Discharge: 2016-10-11 | Disposition: A | Discharge status: Home / Self Care | Payer: Self-pay | Source: Ambulatory Visit | Attending: Cardiovascular Disease | Admitting: Cardiovascular Disease

## 2016-10-13 ENCOUNTER — Encounter (HOSPITAL_COMMUNITY): Payer: Self-pay

## 2016-10-15 ENCOUNTER — Encounter (HOSPITAL_COMMUNITY)
Admission: RE | Admit: 2016-10-15 | Discharge: 2016-10-15 | Disposition: A | Discharge status: Home / Self Care | Payer: Self-pay | Source: Ambulatory Visit | Attending: Cardiovascular Disease | Admitting: Cardiovascular Disease

## 2016-10-18 ENCOUNTER — Encounter (HOSPITAL_COMMUNITY)
Admission: RE | Admit: 2016-10-18 | Discharge: 2016-10-18 | Disposition: A | Discharge status: Home / Self Care | Payer: Self-pay | Source: Ambulatory Visit | Attending: Cardiovascular Disease | Admitting: Cardiovascular Disease

## 2016-10-20 ENCOUNTER — Encounter (HOSPITAL_COMMUNITY)
Admission: RE | Admit: 2016-10-20 | Discharge: 2016-10-20 | Disposition: A | Discharge status: Home / Self Care | Payer: Self-pay | Source: Ambulatory Visit | Attending: Cardiovascular Disease | Admitting: Cardiovascular Disease

## 2016-10-22 ENCOUNTER — Encounter (HOSPITAL_COMMUNITY)
Admission: RE | Admit: 2016-10-22 | Discharge: 2016-10-22 | Disposition: A | Discharge status: Home / Self Care | Payer: Self-pay | Source: Ambulatory Visit | Attending: Cardiovascular Disease | Admitting: Cardiovascular Disease

## 2016-10-25 ENCOUNTER — Encounter (HOSPITAL_COMMUNITY)
Admission: RE | Admit: 2016-10-25 | Discharge: 2016-10-25 | Disposition: A | Discharge status: Home / Self Care | Payer: Self-pay | Source: Ambulatory Visit | Attending: Cardiovascular Disease | Admitting: Cardiovascular Disease

## 2016-10-26 IMAGING — CR DG FOOT COMPLETE 3+V*R*
3 series · 3 of 3 positions shown · non-contrast
Comparison: Plain films right great toe 06/10/2012.

CLINICAL DATA: The patient stubbed his right great toe today.

EXAM:
RIGHT FOOT COMPLETE - 3+ VIEW

[AP]
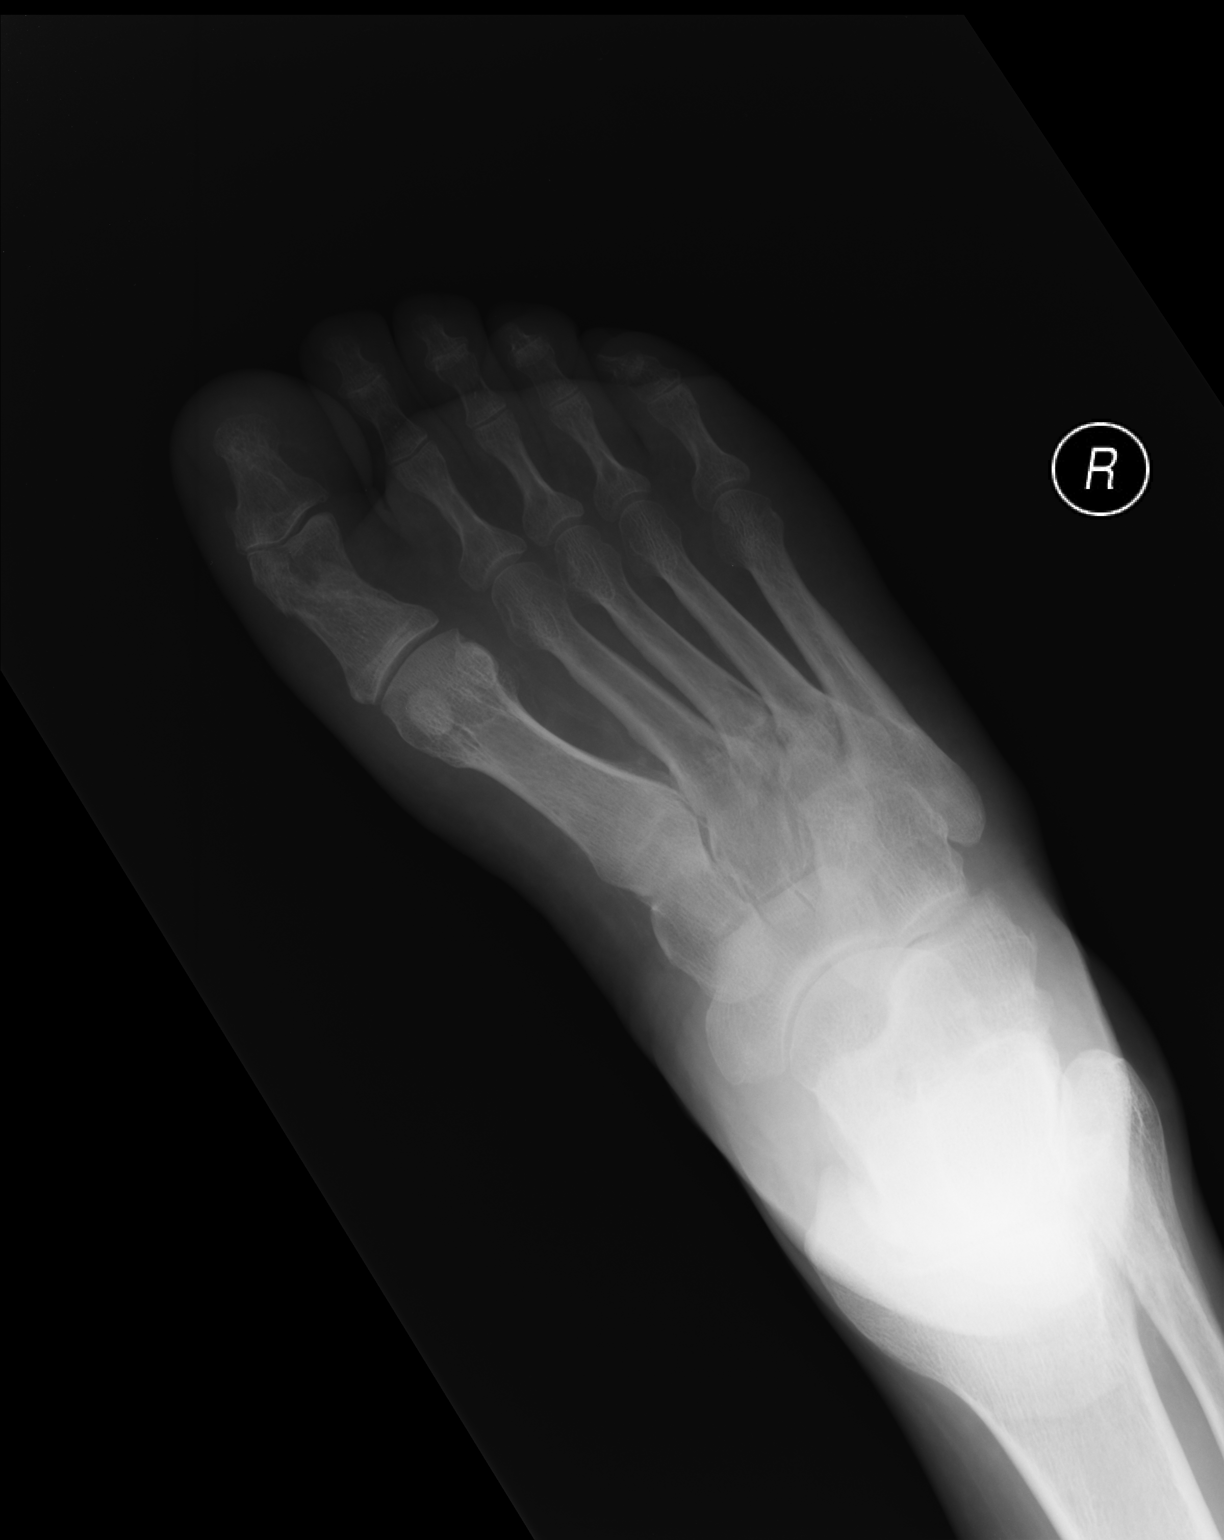

[ap obl int rot]
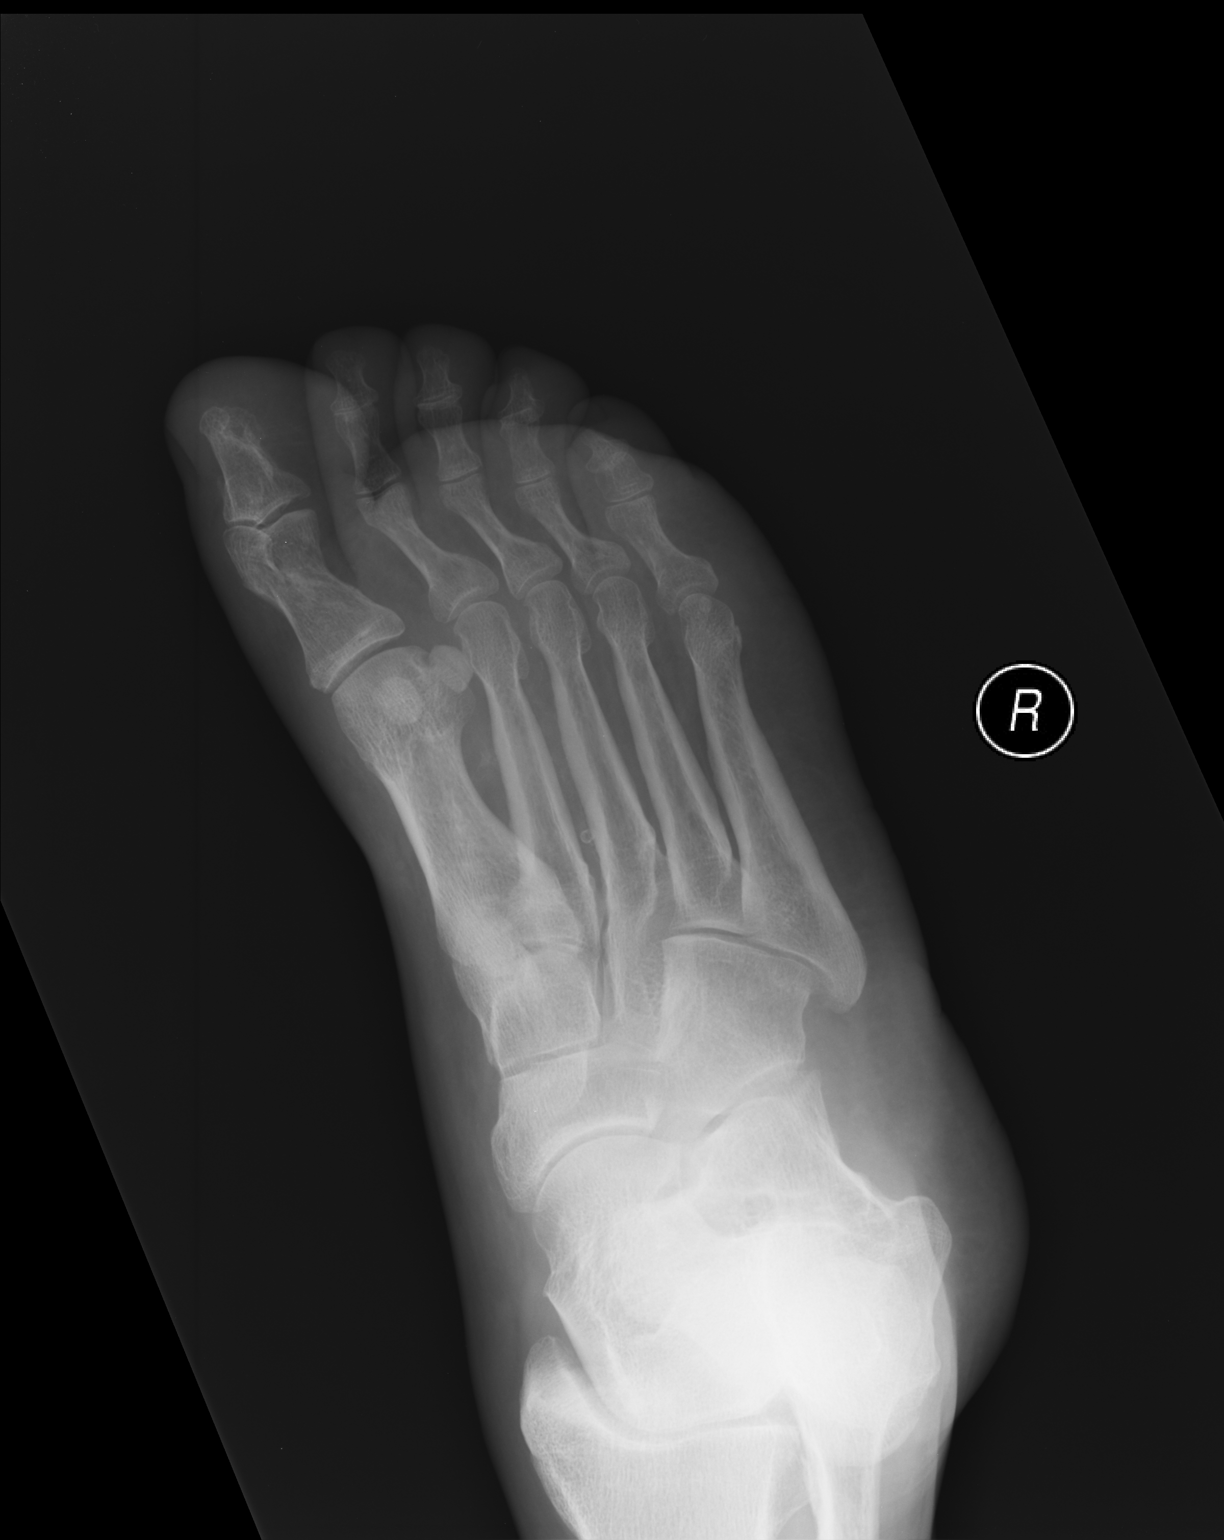

[lateral]
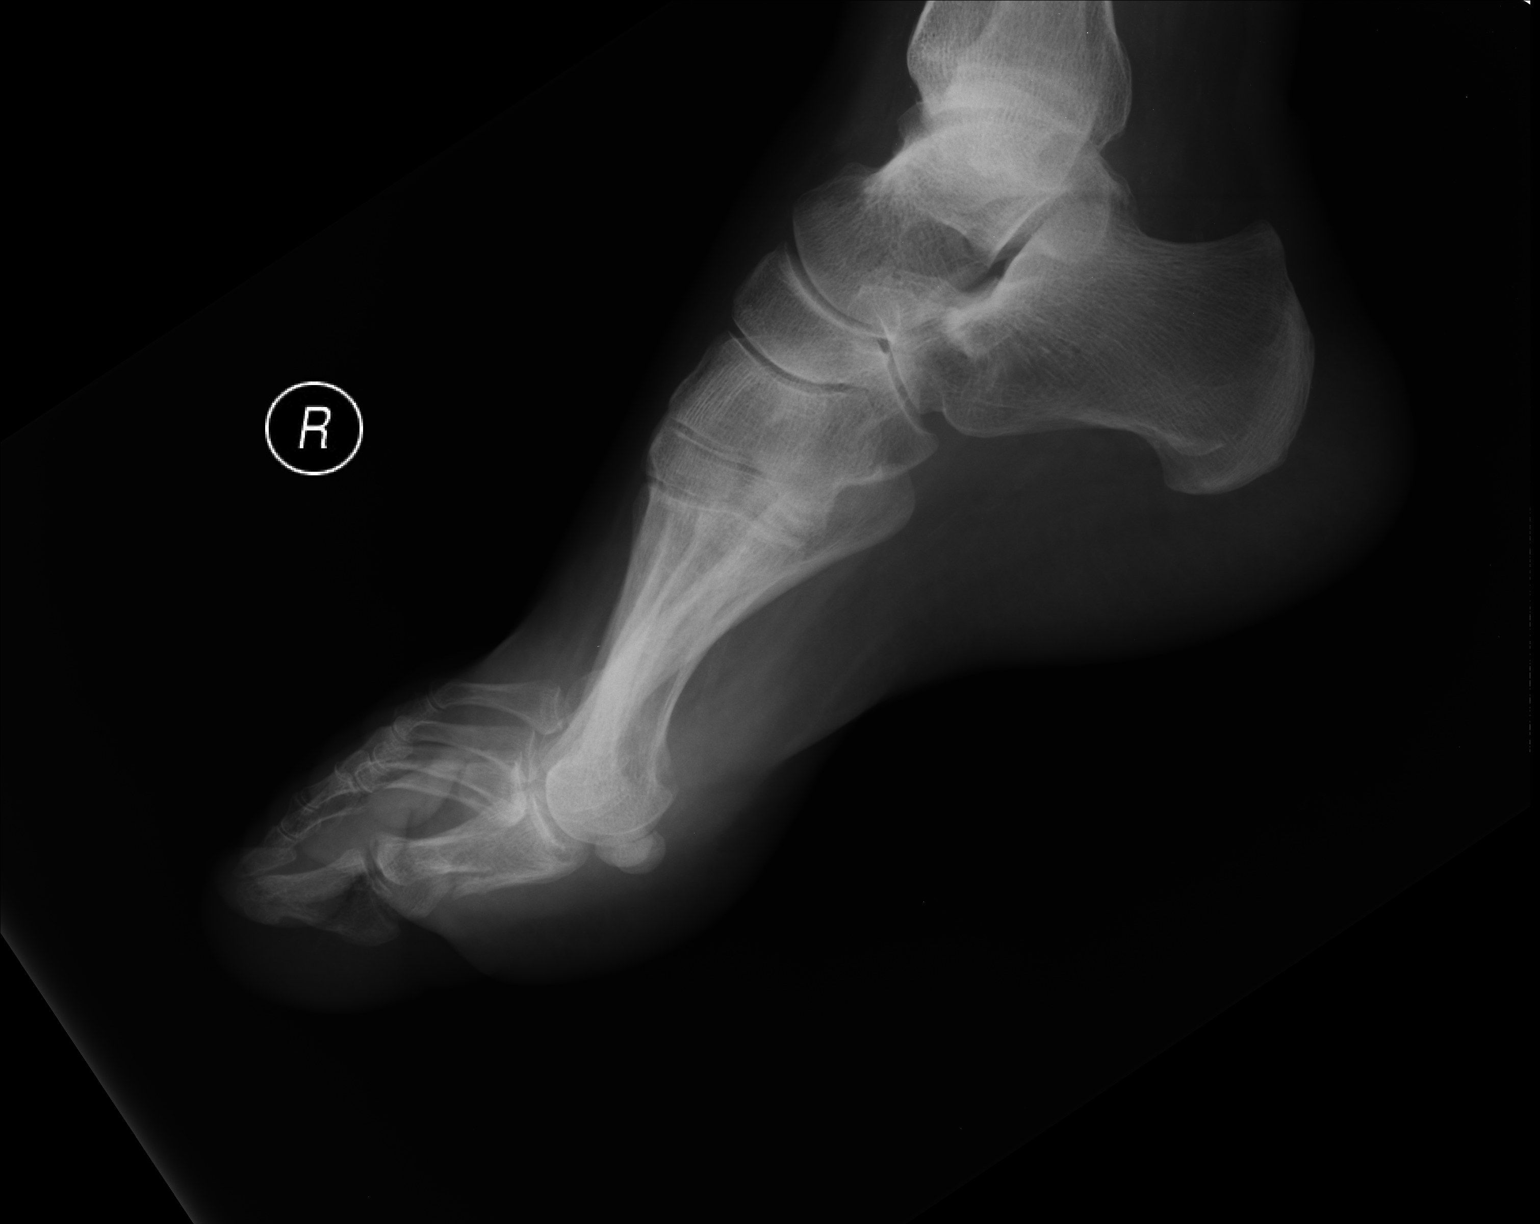

[3 of 3 positions shown; findings below may reference images not displayed]

FINDINGS: The patient has an acute nondisplaced fracture of the proximal
phalanx of the great toe. The fracture extends to the IP joint. No
other acute bony or joint abnormality is identified.
IMPRESSION: Nondisplaced fracture head of the proximal phalanx of the right
great toe.

## 2016-10-27 ENCOUNTER — Encounter (HOSPITAL_COMMUNITY)
Admission: RE | Admit: 2016-10-27 | Discharge: 2016-10-27 | Disposition: A | Discharge status: Home / Self Care | Payer: Self-pay | Source: Ambulatory Visit | Attending: Cardiovascular Disease | Admitting: Cardiovascular Disease

## 2016-10-27 ENCOUNTER — Ambulatory Visit (INDEPENDENT_AMBULATORY_CARE_PROVIDER_SITE_OTHER): Payer: Medicare Other | Admitting: Neurology

## 2016-10-27 ENCOUNTER — Encounter (INDEPENDENT_AMBULATORY_CARE_PROVIDER_SITE_OTHER): Payer: Self-pay

## 2016-10-27 ENCOUNTER — Encounter: Payer: Self-pay | Admitting: Neurology

## 2016-10-27 VITALS — BP 119/76 | HR 87 | Ht 71.0 in | Wt 233.5 lb

## 2016-10-27 DIAGNOSIS — Q8501 Neurofibromatosis, type 1: Secondary | ICD-10-CM | POA: Diagnosis not present

## 2016-10-27 DIAGNOSIS — G40209 Localization-related (focal) (partial) symptomatic epilepsy and epileptic syndromes with complex partial seizures, not intractable, without status epilepticus: Secondary | ICD-10-CM | POA: Diagnosis not present

## 2016-10-27 DIAGNOSIS — I214 Non-ST elevation (NSTEMI) myocardial infarction: Secondary | ICD-10-CM | POA: Insufficient documentation

## 2016-10-27 MED ORDER — CARBAMAZEPINE ER 200 MG PO TB12: 600.0000 mg | ORAL_TABLET | Freq: Two times a day (BID) | ORAL | 3 refills | Status: DC

## 2016-10-27 MED ORDER — LEVETIRACETAM 500 MG PO TABS: ORAL_TABLET | ORAL | 3 refills | Status: DC

## 2016-10-27 NOTE — Progress Notes (Signed)
Faxed printed/signed rx levetiracetam and carbamazepine to pt pharmacy. Fax: [phone]. Received confirmation.

## 2016-10-27 NOTE — Progress Notes (Signed)
Reason for visit: Seizures  Jeff [name] is an 51 y.o. male  History of present illness:  Jeff [name] is a 52 year old left-handed white male with a history of seizures that have been well controlled. The patient has not had any seizures since last seen. He remains on Keppra and carbamazepine. He does report a long-standing history of strabismus with some mild blurring of vision, and if he covers one eye he is able to read fine print much better. The patient has had some problems with fatigue and depression at times. He remains on vitamin D supplementation while on carbamazepine. He also takes Keppra in low dose. The patient is engaged with cardiac rehabilitation on regular basis. He returns for an evaluation.  Past Medical History:  Diagnosis Date  . Anxiety   . CAD (coronary artery disease)    Cath 2011.  LAD stent patent, distal LAD occlusion, D1 100%, OM branch 80 - 90%, SVG to diag patent, LIMA to LAD occluded, SVG to RCA occluded, SVG to OM was occluded.   No change from previous cath.  Managed medically.   . Cataract   . Depression   . Diabetes mellitus type I (HCC)    on insulin pump at  home  . Diabetic retinopathy    s/p vitrectomy and history of retinal surgery  . Hemorrhoid   . History of oxygen administration    nocutural use only at 2 l/m nasally.  [name] HTN (hypertension)   . Hyperlipidemia   . Myocardial infarction (HCC)   . 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 Seasonal allergic rhinitis   . Seizure disorder (HCC)   . Sleep apnea    s/p oral surgery  . Toe fracture, right    11-23-14 fracture right big toe-wearing a stability shoe    Past Surgical History:  Procedure Laterality Date  . APPENDECTOMY    . CARDIAC CATHETERIZATION    . CATARACT EXTRACTION, BILATERAL    . CORONARY ANGIOPLASTY     9'11,  CABPG grafts had failed.  . CORONARY ARTERY BYPASS GRAFT     4 vessel 4/11  . ESOPHAGOGASTRODUODENOSCOPY (EGD) WITH  PROPOFOL N/A 12/05/2014   Procedure: ESOPHAGOGASTRODUODENOSCOPY (EGD) WITH PROPOFOL;  Surgeon: [name], MD;  Location: [facility];  Service: Endoscopy;  Laterality: N/A;  . LAPAROSCOPIC APPENDECTOMY  11/24/2011  . LEFT HEART CATHETERIZATION WITH CORONARY/GRAFT ANGIOGRAM N/A 02/22/2013   Procedure: LEFT HEART CATHETERIZATION WITH [name];  Surgeon: Tonny Bollman, MD;  Location: [facility] CATH LAB;  Service: Cardiovascular;  Laterality: N/A;  . release of right transverse carpal ligament    . right eye vitrectomy and detached retina repair  10/2007  . TONSILLECTOMY    . uvulopalatoplasty surgery      Family History  Problem Relation Age of Onset  . Coronary artery disease Father 88  . Diabetes Father     type II  . Hypertension Father   . Squamous cell carcinoma Brother   . Arthritis Sister     Social history:  reports that he has never smoked. He has never used smokeless tobacco. He reports that he does not drink alcohol or use drugs.    Allergies  Allergen Reactions  . Latex Rash    Medications:  Prior to Admission medications   Medication Sig Start Date End Date Taking? Authorizing Provider  acetaminophen (TYLENOL) 500 MG tablet Take 1,000 mg by mouth every 6 (six) hours as needed for  mild pain.   Yes Historical Provider, MD  aspirin 81 MG tablet Take 81 mg by mouth daily.    Yes Historical Provider, MD  carbamazepine (TEGRETOL XR) 200 MG 12 hr tablet Take 3 tablets (600 mg total) by mouth 2 (two) times daily. Brand is medically necessary. 10/28/15  Yes York Spaniel, MD  Cholecalciferol (VITAMIN D) 2000 units CAPS Take 2,000 Units by mouth daily.    Yes Historical Provider, MD  desvenlafaxine (PRISTIQ) 100 MG 24 hr tablet Take 100 mg by mouth daily. 04/03/16  Yes Historical Provider, MD  docusate sodium (COLACE) 100 MG capsule Take 100 mg by mouth daily.    Yes Historical Provider, MD  ezetimibe (ZETIA) 10 MG tablet Take 10 mg by mouth at bedtime.    Yes  Historical Provider, MD  Insulin Human (INSULIN PUMP) 100 unit/ml SOLN Inject 1 each into the skin continuous. "humalog"   Yes Historical Provider, MD  isosorbide mononitrate (IMDUR) 60 MG 24 hr tablet Take 60 mg by mouth every morning.    Yes Historical Provider, MD  levETIRAcetam (KEPPRA) 500 MG tablet Take 250 mg every morning and 500 mg every evening. 10/28/15  Yes York Spaniel, MD  lisinopril (PRINIVIL,ZESTRIL) 5 MG tablet Take 5 mg by mouth every morning.    Yes Historical Provider, MD  metoprolol tartrate (LOPRESSOR) 25 MG tablet Take 1 tablet (25 mg total) by mouth 2 (two) times daily. 05/03/16  Yes Tonny Bollman, MD  multivitamin (ONE-A-DAY MEN'S) TABS Take 1 tablet by mouth daily.    Yes Historical Provider, MD  nitroGLYCERIN (NITROSTAT) 0.4 MG SL tablet place 1 tablet under the tongue if needed every 5 minutes for chest pain 06/25/16  Yes Tonny Bollman, MD  omeprazole (PRILOSEC) 20 MG capsule Take 1 capsule (20 mg total) by mouth daily. 02/16/16  Yes [name], MD  OXYGEN Place 2 L into the nose at bedtime.   Yes Historical Provider, MD  rosuvastatin (CRESTOR) 40 MG tablet Take 40 mg by mouth at bedtime.    Yes Historical Provider, MD  vitamin B-12 (CYANOCOBALAMIN) 500 MCG tablet Take 500 mcg by mouth daily.   Yes Historical Provider, MD    ROS:  Out of a complete 14 system review of symptoms, the patient complains only of the following symptoms, and all other reviewed systems are negative.  Fatigue Hearing loss, ringing in the ears Double vision Insomnia, sleep apnea, daytime sleepiness, snoring Frequency of urination, urinary urgency, testicular pain Headache Depression, anxiety  Blood pressure 119/76, pulse 87, height 5\' 11"  (1.803 m), weight 233 lb 8 oz (105.9 kg).  Physical Exam  General: The patient is alert and cooperative at the time of the examination.  Skin: No significant peripheral edema is noted.   Neurologic Exam  Mental status: The patient is  alert and oriented x 3 at the time of the examination. The patient has apparent normal recent and remote memory, with an apparently normal attention span and concentration ability.   Cranial nerves: Facial symmetry is present. Speech is normal, no aphasia or dysarthria is noted. Extraocular movements are full. Visual fields are full.  Motor: The patient has good strength in all 4 extremities.  Sensory examination: Soft touch sensation is symmetric on the face, arms, and legs.  Coordination: The patient has good finger-nose-finger and heel-to-shin bilaterally.  Gait and station: The patient has a normal gait. Tandem gait is unsteady. Romberg is negative. No drift is seen.  Reflexes: Deep tendon reflexes are symmetric.  Assessment/Plan:  1. Neurofibromatosis type I  2. History of seizures  The patient appears to be doing well with the seizures. He will continue the carbamazepine and Keppra, a prescription was given for these medications. Last year, his primary care physician checked a carbamazepine level, I will not draw blood work today. The patient usually is seen in July for his annual blood work. He will follow-up in one year, sooner if needed. The patient last had MRI the brain in 2014, we will consider a repeat study next year.  Marlan Palau MD 10/27/2016 10:05 AM  [facility] [address] [place], [place] [address]  Phone [phone] Fax [phone]

## 2016-10-29 ENCOUNTER — Encounter (HOSPITAL_COMMUNITY)
Admission: RE | Admit: 2016-10-29 | Discharge: 2016-10-29 | Disposition: A | Discharge status: Home / Self Care | Payer: Self-pay | Source: Ambulatory Visit | Attending: Cardiovascular Disease | Admitting: Cardiovascular Disease

## 2016-11-01 ENCOUNTER — Encounter (HOSPITAL_COMMUNITY)
Admission: RE | Admit: 2016-11-01 | Discharge: 2016-11-01 | Disposition: A | Discharge status: Home / Self Care | Payer: Self-pay | Source: Ambulatory Visit | Attending: Cardiovascular Disease | Admitting: Cardiovascular Disease

## 2016-11-03 ENCOUNTER — Encounter (HOSPITAL_COMMUNITY)
Admission: RE | Admit: 2016-11-03 | Discharge: 2016-11-03 | Disposition: A | Discharge status: Home / Self Care | Payer: Self-pay | Source: Ambulatory Visit | Attending: Cardiovascular Disease | Admitting: Cardiovascular Disease

## 2016-11-05 ENCOUNTER — Encounter (HOSPITAL_COMMUNITY)
Admission: RE | Admit: 2016-11-05 | Discharge: 2016-11-05 | Disposition: A | Discharge status: Home / Self Care | Payer: Self-pay | Source: Ambulatory Visit | Attending: Cardiovascular Disease | Admitting: Cardiovascular Disease

## 2016-11-08 ENCOUNTER — Encounter (HOSPITAL_COMMUNITY)
Admission: RE | Admit: 2016-11-08 | Discharge: 2016-11-08 | Disposition: A | Discharge status: Home / Self Care | Payer: Self-pay | Source: Ambulatory Visit | Attending: Cardiovascular Disease | Admitting: Cardiovascular Disease

## 2016-11-10 ENCOUNTER — Encounter (HOSPITAL_COMMUNITY): Payer: Self-pay

## 2016-11-12 ENCOUNTER — Encounter (HOSPITAL_COMMUNITY): Payer: Self-pay

## 2016-11-15 ENCOUNTER — Encounter: Payer: Self-pay | Admitting: Cardiovascular Disease

## 2016-11-15 ENCOUNTER — Encounter (HOSPITAL_COMMUNITY)
Admission: RE | Admit: 2016-11-15 | Discharge: 2016-11-15 | Disposition: A | Discharge status: Home / Self Care | Payer: Self-pay | Source: Ambulatory Visit | Attending: Cardiovascular Disease | Admitting: Cardiovascular Disease

## 2016-11-15 ENCOUNTER — Ambulatory Visit (INDEPENDENT_AMBULATORY_CARE_PROVIDER_SITE_OTHER): Payer: Medicare Other | Admitting: Cardiovascular Disease

## 2016-11-15 ENCOUNTER — Encounter (INDEPENDENT_AMBULATORY_CARE_PROVIDER_SITE_OTHER): Payer: Self-pay

## 2016-11-15 VITALS — BP 118/68 | HR 74 | Ht 71.0 in | Wt 234.4 lb

## 2016-11-15 DIAGNOSIS — I25119 Atherosclerotic heart disease of native coronary artery with unspecified angina pectoris: Secondary | ICD-10-CM | POA: Diagnosis not present

## 2016-11-15 DIAGNOSIS — R5383 Other fatigue: Secondary | ICD-10-CM

## 2016-11-15 DIAGNOSIS — R0602 Shortness of breath: Secondary | ICD-10-CM

## 2016-11-15 NOTE — Patient Instructions (Signed)
Medication Instructions:  Your physician recommends that you continue on your current medications as directed. Please refer to the Current Medication list given to you today.  Labwork: No new orders.   Testing/Procedures: Your physician has requested that you have an echocardiogram. Echocardiography is a painless test that uses sound waves to create images of your heart. It provides your doctor with information about the size and shape of your heart and how well your heart's chambers and valves are working. This procedure takes approximately one hour. There are no restrictions for this procedure.  Follow-Up: Your physician wants you to follow-up in: 6 MONTHS with Dr Cooper.  You will receive a reminder letter in the mail two months in advance. If you don't receive a letter, please call our office to schedule the follow-up appointment.   Any Other Special Instructions Will Be Listed Below (If Applicable).     If you need a refill on your cardiac medications before your next appointment, please call your pharmacy.   

## 2016-11-15 NOTE — Progress Notes (Signed)
Cardiology Office Note Date:  11/17/2016   ID:  Jeff [name], DOB 10-24-1965, MRN [id number]  PCP:  Rodrigo Ran, MD  Cardiologist:  Tonny Bollman, MD    Chief Complaint  Patient presents with  . Follow-up     History of Present Illness: Jeff [name] is a 51 y.o. male who presents for follow-up of CAD.   The patient is followed for coronary artery disease with history of CABG in 2011 and early bypass graft failure. He was treated with multivessel PCI following CABG (Native LAD and SVG-diagonal graft were stented, all other grafts occluded). His last heart catheterization in 2014 was performed when he presented with  worsening chest discomfort. This showed continued patency of his stent sites and continued patency of his diagonal vein graft. There was a pattern of diffuse small vessel disease noted and medical therapy was continued. LV function has been normal. He has participated in the maintenance phase of cardiac rehab now for several years.  He complains of generalized fatigue, more pronounced recently. He denies chest pain or chest pressure. He has mild shortness of breath with activity which is unchanged. Denies edema, orthopnea, PND, or heart palpitations. He continues to participating in cardiac rehabilitation without any recent change in exercise tolerance. He has trouble exercising on his off days.   Past Medical History:  Diagnosis Date  . Anxiety   . CAD (coronary artery disease)    Cath 2011.  LAD stent patent, distal LAD occlusion, D1 100%, OM branch 80 - 90%, SVG to diag patent, LIMA to LAD occluded, SVG to RCA occluded, SVG to OM was occluded.   No change from previous cath.  Managed medically.   . Cataract   . Depression   . Diabetes mellitus type I (HCC)    on insulin pump at  home  . Diabetic retinopathy    s/p vitrectomy and history of retinal surgery  . Hemorrhoid   . History of oxygen administration    nocutural use only at 2 l/m nasally.  [name] HTN  (hypertension)   . Hyperlipidemia   . Myocardial infarction (HCC)   . 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 Seasonal allergic rhinitis   . Seizure disorder (HCC)   . Sleep apnea    s/p oral surgery  . Toe fracture, right    11-23-14 fracture right big toe-wearing a stability shoe    Past Surgical History:  Procedure Laterality Date  . APPENDECTOMY    . CARDIAC CATHETERIZATION    . CATARACT EXTRACTION, BILATERAL    . CORONARY ANGIOPLASTY     9'11,  CABPG grafts had failed.  . CORONARY ARTERY BYPASS GRAFT     4 vessel 4/11  . ESOPHAGOGASTRODUODENOSCOPY (EGD) WITH PROPOFOL N/A 12/05/2014   Procedure: ESOPHAGOGASTRODUODENOSCOPY (EGD) WITH PROPOFOL;  Surgeon: [name], MD;  Location: [facility];  Service: Endoscopy;  Laterality: N/A;  . LAPAROSCOPIC APPENDECTOMY  11/24/2011  . LEFT HEART CATHETERIZATION WITH CORONARY/GRAFT ANGIOGRAM N/A 02/22/2013   Procedure: LEFT HEART CATHETERIZATION WITH [name];  Surgeon: Tonny Bollman, MD;  Location[facility] CATH LAB;  Service: Cardiovascular;  Laterality: N/A;  . release of right transverse carpal ligament    . right eye vitrectomy and detached retina repair  10/2007  . TONSILLECTOMY    . uvulopalatoplasty surgery      Current Outpatient Prescriptions  Medication Sig Dispense Refill  . acetaminophen (TYLENOL) 500 MG tablet Take 1,000 mg by mouth  every 6 (six) hours as needed for mild pain.    [name] aspirin 81 MG tablet Take 81 mg by mouth daily.     . carbamazepine (TEGRETOL XR) 200 MG 12 hr tablet Take 3 tablets (600 mg total) by mouth 2 (two) times daily. Brand is medically necessary. 540 tablet 3  . Cholecalciferol (VITAMIN D) 2000 units CAPS Take 2,000 Units by mouth daily.     [name] desvenlafaxine (PRISTIQ) 100 MG 24 hr tablet Take 100 mg by mouth daily.    [name] docusate sodium (COLACE) 100 MG capsule Take 100 mg by mouth daily.     [name] ezetimibe (ZETIA) 10 MG tablet Take 10 mg by mouth at  bedtime.     . Insulin Human (INSULIN PUMP) 100 unit/ml SOLN Inject 1 each into the skin continuous. "humalog"    . isosorbide mononitrate (IMDUR) 60 MG 24 hr tablet Take 60 mg by mouth every morning.     . levETIRAcetam (KEPPRA) 250 MG tablet Take 250 mg by mouth every morning. Take 500mg  by mouth every evening    . lisinopril (PRINIVIL,ZESTRIL) 5 MG tablet Take 5 mg by mouth every morning.     . metoprolol tartrate (LOPRESSOR) 25 MG tablet Take 1 tablet (25 mg total) by mouth 2 (two) times daily. 60 tablet 11  . multivitamin (ONE-A-DAY MEN'S) TABS Take 1 tablet by mouth daily.     . nitroGLYCERIN (NITROSTAT) 0.4 MG SL tablet place 1 tablet under the tongue if needed every 5 minutes for chest pain 25 tablet 3  . omeprazole (PRILOSEC) 20 MG capsule Take 1 capsule (20 mg total) by mouth daily. 30 capsule 11  . OXYGEN Place 2 L into the nose at bedtime.    . rosuvastatin (CRESTOR) 40 MG tablet Take 40 mg by mouth at bedtime.     . vitamin B-12 (CYANOCOBALAMIN) 500 MCG tablet Take 500 mcg by mouth daily.     No current facility-administered medications for this visit.     Allergies:   Latex   Social History:  The patient  reports that he has never smoked. He has never used smokeless tobacco. He reports that he does not drink alcohol or use drugs.   Family History:  The patient's  family history includes Arthritis in his sister; Coronary artery disease (age of onset: 86) in his father; Diabetes in his father; Hypertension in his father; Squamous cell carcinoma in his brother.    ROS:  Please see the history of present illness.  All other systems are reviewed and negative.    PHYSICAL EXAM: VS:  BP 118/68   Pulse 74   Ht 5\' 11"  (1.803 m)   Wt 234 lb 6.4 oz (106.3 kg)   BMI 32.69 kg/m  , BMI Body mass index is 32.69 kg/m. GEN: Well nourished, well developed, in no acute distress  HEENT: normal  Neck: no JVD, no masses. No carotid bruits Cardiac: RRR without murmur or gallop                 Respiratory:  clear to auscultation bilaterally, normal work of breathing GI: soft, nontender, nondistended, + BS MS: no deformity or atrophy  Ext: no pretibial edema, pedal pulses 2+= bilaterally Skin: warm and dry, no rash Neuro:  Strength and sensation are intact Psych: euthymic mood, full affect  EKG:  EKG is ordered today. The ekg ordered today shows NSR 74 bpm, incomplete RBBB  Recent Labs: 05/26/2016: BUN 11; Creatinine, Ser 0.81; Hemoglobin 15.6; Platelets  192; Potassium 5.2; Sodium 139   Lipid Panel     Component Value Date/Time   CHOL 138 06/03/2011 0640   TRIG 111 06/03/2011 0640   HDL 42 06/03/2011 0640   CHOLHDL 3.3 06/03/2011 0640   VLDL 22 06/03/2011 0640   LDLCALC 74 06/03/2011 0640      Wt Readings from Last 3 Encounters:  11/15/16 234 lb 6.4 oz (106.3 kg)  10/27/16 233 lb 8 oz (105.9 kg)  05/26/16 236 lb 12.4 oz (107.4 kg)     Cardiac Studies Reviewed: Myoview Stress Perfusion Study 06-17-2015: Study Highlights    Nuclear stress EF: 69%.  There was no ST segment deviation noted during stress.  The study is normal.  This is a low risk study.    Nuclear History and Indications   History and Indications Indication for Stress Test: Evaluation of extent and severity of coronary artery disease History: CABG;'12 OZH:YQMVHQ and EF=79% Cardiac Risk Factors: Hypertension;IDDM and Lipids  Symptoms: Chest Pressure at Rest and with Exertion (last date of chest discomfort 4 days ago), DOE, Fatigue and SOB    Stress Findings   ECG Baseline ECG exhibits normal sinus rhythm..    Stress Findings A pharmacological stress test was performed using IV Lexiscan 0.4mg  over 10 seconds performed without concurrent submaximal exercise.  The patient reported chest discomfort, shortness of breath and headache during the stress test. The patient experienced no angina during the stress test.   Test was stopped per protocol.   Recovery time: 5 minutes.    Response  to Stress There was no ST segment deviation noted during stress.  Arrhythmias during stress: none.    Stress Measurements   Baseline Vitals  Rest HR 64 bpm    Rest BP 118/65 mmHg    Peak Stress Vitals  Peak HR 85 bpm    Peak BP 119/60 mmHg       Nuclear Stress Measurements   LV sys vol 32 mL    TID 1.06     LV dias vol 104 mL    LHR 0.28     SSS 5     SRS 3     SDS 2          Nuclear Stress Findings   Isotope administration Rest isotope was administered with an IV injection of 10.6 mCi Tc34m Sestamibi. Rest SPECT images were obtained approximately 45 minutes post tracer injection. Stress isotope was administered with an IV injection of 32.6 mCi Tc84m Sestamibi 20 seconds post IV Lexiscan administration. Stress SPECT images were obtained approximately 60 minutes post tracer injection.    Nuclear Study Quality Overall image quality is good.Image quality affected due to patient motion.    Nuclear Measurements Study was gated.    Rest Perfusion Rest perfusion normal.    Stress Perfusion Stress perfusion normal.    Overall Study Impression Myocardial perfusion is normal. The study is normal. This is a low risk study. Overall left ventricular systolic function was normal. LV cavity size is normal. Nuclear stress EF: 69%.   From: ACCF/SCAI/STS/AATS/AHA/ASNC/HFSA/SCCT 2012 Appropriate Use Criteria for Coronary Revascularization Focused Update       ASSESSMENT AND PLAN: 1.  CAD, native vessel, with angina: stable symptoms, not lifestyle limiting. Medical program reviewed - ASA, isosorbide, lisinopril, metoprolol. No changes recommended today.   2. Abnormal EKG - he is given a copy of his EKG to carry with him. Mild inferior ST elevation is chronic and unchanged.   3. Obstructive sleep apnea -  unable to tolerate CPAP. Has possible changes of RVH on EKG. Will update an echo to evaluate for pulmonary HTN.   4. Hyperlipidemia: followed by Dr Waynard Edwards - treated with crestor  40 mg and Zetia 10 mg. I encouraged to try to exercise on his off days for cardiac rehab.    Current medicines are reviewed with the patient today.  The patient does not have concerns regarding medicines.  Labs/ tests ordered today include:   Orders Placed This Encounter  Procedures  . EKG 12-Lead  . ECHOCARDIOGRAM COMPLETE    Disposition:   FU 6 months  Signed, Tonny Bollman, MD  11/17/2016 5:15 AM    [facility] [address] [place], [place], [place]  [address] Phone: [phone]; Fax: [phone]

## 2016-11-17 ENCOUNTER — Encounter (HOSPITAL_COMMUNITY)
Admission: RE | Admit: 2016-11-17 | Discharge: 2016-11-17 | Disposition: A | Discharge status: Home / Self Care | Payer: Self-pay | Source: Ambulatory Visit | Attending: Cardiovascular Disease | Admitting: Cardiovascular Disease

## 2016-11-19 ENCOUNTER — Encounter (HOSPITAL_COMMUNITY)
Admission: RE | Admit: 2016-11-19 | Discharge: 2016-11-19 | Disposition: A | Discharge status: Home / Self Care | Payer: Self-pay | Source: Ambulatory Visit | Attending: Cardiovascular Disease | Admitting: Cardiovascular Disease

## 2016-11-24 ENCOUNTER — Encounter (HOSPITAL_COMMUNITY)
Admission: RE | Admit: 2016-11-24 | Discharge: 2016-11-24 | Disposition: A | Discharge status: Home / Self Care | Payer: Self-pay | Source: Ambulatory Visit | Attending: Cardiovascular Disease | Admitting: Cardiovascular Disease

## 2016-11-26 ENCOUNTER — Encounter (HOSPITAL_COMMUNITY): Payer: Self-pay

## 2016-11-26 DIAGNOSIS — I214 Non-ST elevation (NSTEMI) myocardial infarction: Secondary | ICD-10-CM | POA: Insufficient documentation

## 2016-11-29 ENCOUNTER — Encounter (HOSPITAL_COMMUNITY): Payer: Self-pay

## 2016-11-30 ENCOUNTER — Encounter (INDEPENDENT_AMBULATORY_CARE_PROVIDER_SITE_OTHER): Payer: Self-pay

## 2016-11-30 ENCOUNTER — Ambulatory Visit (HOSPITAL_COMMUNITY): Payer: Medicare Other | Attending: Cardiovascular Disease

## 2016-11-30 ENCOUNTER — Other Ambulatory Visit: Payer: Self-pay

## 2016-11-30 DIAGNOSIS — R5383 Other fatigue: Secondary | ICD-10-CM

## 2016-11-30 DIAGNOSIS — I119 Hypertensive heart disease without heart failure: Secondary | ICD-10-CM | POA: Diagnosis not present

## 2016-11-30 DIAGNOSIS — I2510 Atherosclerotic heart disease of native coronary artery without angina pectoris: Secondary | ICD-10-CM | POA: Diagnosis not present

## 2016-11-30 DIAGNOSIS — Z951 Presence of aortocoronary bypass graft: Secondary | ICD-10-CM | POA: Insufficient documentation

## 2016-11-30 DIAGNOSIS — I059 Rheumatic mitral valve disease, unspecified: Secondary | ICD-10-CM | POA: Insufficient documentation

## 2016-11-30 DIAGNOSIS — R0602 Shortness of breath: Secondary | ICD-10-CM | POA: Diagnosis not present

## 2016-11-30 DIAGNOSIS — E119 Type 2 diabetes mellitus without complications: Secondary | ICD-10-CM | POA: Diagnosis not present

## 2016-11-30 DIAGNOSIS — E785 Hyperlipidemia, unspecified: Secondary | ICD-10-CM | POA: Insufficient documentation

## 2016-12-01 ENCOUNTER — Encounter (HOSPITAL_COMMUNITY)
Admission: RE | Admit: 2016-12-01 | Discharge: 2016-12-01 | Disposition: A | Discharge status: Home / Self Care | Payer: Self-pay | Source: Ambulatory Visit | Attending: Cardiovascular Disease | Admitting: Cardiovascular Disease

## 2016-12-03 ENCOUNTER — Encounter (HOSPITAL_COMMUNITY)
Admission: RE | Admit: 2016-12-03 | Discharge: 2016-12-03 | Disposition: A | Discharge status: Home / Self Care | Payer: Self-pay | Source: Ambulatory Visit | Attending: Cardiovascular Disease | Admitting: Cardiovascular Disease

## 2016-12-06 ENCOUNTER — Encounter (HOSPITAL_COMMUNITY)
Admission: RE | Admit: 2016-12-06 | Discharge: 2016-12-06 | Disposition: A | Discharge status: Home / Self Care | Payer: Self-pay | Source: Ambulatory Visit | Attending: Cardiovascular Disease | Admitting: Cardiovascular Disease

## 2016-12-06 ENCOUNTER — Encounter: Payer: Self-pay | Admitting: Cardiovascular Disease

## 2016-12-06 NOTE — Telephone Encounter (Signed)
This encounter was created in error - please disregard.

## 2016-12-06 NOTE — Telephone Encounter (Signed)
New message   Pt verbalized thta the is calling for his echo results

## 2016-12-08 ENCOUNTER — Encounter (HOSPITAL_COMMUNITY): Payer: Self-pay

## 2016-12-10 ENCOUNTER — Encounter (HOSPITAL_COMMUNITY)
Admission: RE | Admit: 2016-12-10 | Discharge: 2016-12-10 | Disposition: A | Discharge status: Home / Self Care | Payer: Self-pay | Source: Ambulatory Visit | Attending: Cardiovascular Disease | Admitting: Cardiovascular Disease

## 2016-12-13 ENCOUNTER — Encounter (HOSPITAL_COMMUNITY)
Admission: RE | Admit: 2016-12-13 | Discharge: 2016-12-13 | Disposition: A | Discharge status: Home / Self Care | Payer: Self-pay | Source: Ambulatory Visit | Attending: Cardiovascular Disease | Admitting: Cardiovascular Disease

## 2016-12-15 ENCOUNTER — Encounter (HOSPITAL_COMMUNITY)
Admission: RE | Admit: 2016-12-15 | Discharge: 2016-12-15 | Disposition: A | Discharge status: Home / Self Care | Payer: Self-pay | Source: Ambulatory Visit | Attending: Cardiovascular Disease | Admitting: Cardiovascular Disease

## 2016-12-17 ENCOUNTER — Encounter (HOSPITAL_COMMUNITY): Payer: Self-pay

## 2016-12-20 ENCOUNTER — Encounter (HOSPITAL_COMMUNITY)
Admission: RE | Admit: 2016-12-20 | Discharge: 2016-12-20 | Disposition: A | Discharge status: Home / Self Care | Payer: Self-pay | Source: Ambulatory Visit | Attending: Cardiovascular Disease | Admitting: Cardiovascular Disease

## 2016-12-22 ENCOUNTER — Encounter (HOSPITAL_COMMUNITY)
Admission: RE | Admit: 2016-12-22 | Discharge: 2016-12-22 | Disposition: A | Discharge status: Home / Self Care | Payer: Self-pay | Source: Ambulatory Visit | Attending: Cardiovascular Disease | Admitting: Cardiovascular Disease

## 2016-12-24 ENCOUNTER — Encounter (HOSPITAL_COMMUNITY)
Admission: RE | Admit: 2016-12-24 | Discharge: 2016-12-24 | Disposition: A | Discharge status: Home / Self Care | Payer: Self-pay | Source: Ambulatory Visit | Attending: Cardiovascular Disease | Admitting: Cardiovascular Disease

## 2016-12-27 ENCOUNTER — Encounter (HOSPITAL_COMMUNITY)
Admission: RE | Admit: 2016-12-27 | Discharge: 2016-12-27 | Disposition: A | Discharge status: Home / Self Care | Payer: Self-pay | Source: Ambulatory Visit | Attending: Cardiovascular Disease | Admitting: Cardiovascular Disease

## 2016-12-27 DIAGNOSIS — I214 Non-ST elevation (NSTEMI) myocardial infarction: Secondary | ICD-10-CM | POA: Insufficient documentation

## 2016-12-31 ENCOUNTER — Encounter (HOSPITAL_COMMUNITY): Payer: Self-pay

## 2017-01-03 ENCOUNTER — Encounter (HOSPITAL_COMMUNITY)
Admission: RE | Admit: 2017-01-03 | Discharge: 2017-01-03 | Disposition: A | Discharge status: Home / Self Care | Payer: Self-pay | Source: Ambulatory Visit | Attending: Cardiovascular Disease | Admitting: Cardiovascular Disease

## 2017-01-05 ENCOUNTER — Encounter (HOSPITAL_COMMUNITY)
Admission: RE | Admit: 2017-01-05 | Discharge: 2017-01-05 | Disposition: A | Discharge status: Home / Self Care | Payer: Self-pay | Source: Ambulatory Visit | Attending: Cardiovascular Disease | Admitting: Cardiovascular Disease

## 2017-01-07 ENCOUNTER — Encounter (HOSPITAL_COMMUNITY)
Admission: RE | Admit: 2017-01-07 | Discharge: 2017-01-07 | Disposition: A | Discharge status: Home / Self Care | Payer: Self-pay | Source: Ambulatory Visit | Attending: Cardiovascular Disease | Admitting: Cardiovascular Disease

## 2017-01-10 ENCOUNTER — Encounter (HOSPITAL_COMMUNITY)
Admission: RE | Admit: 2017-01-10 | Discharge: 2017-01-10 | Disposition: A | Discharge status: Home / Self Care | Payer: Self-pay | Source: Ambulatory Visit | Attending: Cardiovascular Disease | Admitting: Cardiovascular Disease

## 2017-01-12 ENCOUNTER — Encounter (HOSPITAL_COMMUNITY)
Admission: RE | Admit: 2017-01-12 | Discharge: 2017-01-12 | Disposition: A | Discharge status: Home / Self Care | Payer: Self-pay | Source: Ambulatory Visit | Attending: Cardiovascular Disease | Admitting: Cardiovascular Disease

## 2017-01-14 ENCOUNTER — Encounter (HOSPITAL_COMMUNITY)
Admission: RE | Admit: 2017-01-14 | Discharge: 2017-01-14 | Disposition: A | Discharge status: Home / Self Care | Payer: Self-pay | Source: Ambulatory Visit | Attending: Cardiovascular Disease | Admitting: Cardiovascular Disease

## 2017-01-17 ENCOUNTER — Encounter (HOSPITAL_COMMUNITY)
Admission: RE | Admit: 2017-01-17 | Discharge: 2017-01-17 | Disposition: A | Discharge status: Home / Self Care | Payer: Self-pay | Source: Ambulatory Visit | Attending: Cardiovascular Disease | Admitting: Cardiovascular Disease

## 2017-01-19 ENCOUNTER — Encounter (HOSPITAL_COMMUNITY)
Admission: RE | Admit: 2017-01-19 | Discharge: 2017-01-19 | Disposition: A | Discharge status: Home / Self Care | Payer: Self-pay | Source: Ambulatory Visit | Attending: Cardiovascular Disease | Admitting: Cardiovascular Disease

## 2017-01-21 ENCOUNTER — Encounter (HOSPITAL_COMMUNITY)
Admission: RE | Admit: 2017-01-21 | Discharge: 2017-01-21 | Disposition: A | Discharge status: Home / Self Care | Payer: Self-pay | Source: Ambulatory Visit | Attending: Cardiovascular Disease | Admitting: Cardiovascular Disease

## 2017-01-24 ENCOUNTER — Encounter (HOSPITAL_COMMUNITY)
Admission: RE | Admit: 2017-01-24 | Discharge: 2017-01-24 | Disposition: A | Discharge status: Home / Self Care | Payer: Self-pay | Source: Ambulatory Visit | Attending: Cardiovascular Disease | Admitting: Cardiovascular Disease

## 2017-01-26 ENCOUNTER — Encounter (HOSPITAL_COMMUNITY)
Admission: RE | Admit: 2017-01-26 | Discharge: 2017-01-26 | Disposition: A | Discharge status: Home / Self Care | Payer: Self-pay | Source: Ambulatory Visit | Attending: Cardiovascular Disease | Admitting: Cardiovascular Disease

## 2017-01-26 DIAGNOSIS — I214 Non-ST elevation (NSTEMI) myocardial infarction: Secondary | ICD-10-CM | POA: Insufficient documentation

## 2017-01-28 ENCOUNTER — Encounter (HOSPITAL_COMMUNITY)
Admission: RE | Admit: 2017-01-28 | Discharge: 2017-01-28 | Disposition: A | Discharge status: Home / Self Care | Payer: Self-pay | Source: Ambulatory Visit | Attending: Cardiovascular Disease | Admitting: Cardiovascular Disease

## 2017-01-31 ENCOUNTER — Encounter (HOSPITAL_COMMUNITY)
Admission: RE | Admit: 2017-01-31 | Discharge: 2017-01-31 | Disposition: A | Discharge status: Home / Self Care | Payer: Self-pay | Source: Ambulatory Visit | Attending: Cardiovascular Disease | Admitting: Cardiovascular Disease

## 2017-02-02 ENCOUNTER — Encounter (HOSPITAL_COMMUNITY)
Admission: RE | Admit: 2017-02-02 | Discharge: 2017-02-02 | Disposition: A | Discharge status: Home / Self Care | Payer: Self-pay | Source: Ambulatory Visit | Attending: Cardiovascular Disease | Admitting: Cardiovascular Disease

## 2017-02-02 ENCOUNTER — Telehealth: Payer: Self-pay | Admitting: Neurology

## 2017-02-02 NOTE — Telephone Encounter (Signed)
The primary care physician has sent over the blood work results done on 02/01/2017. The BUN is 9, creatinine 0.8, sodium 138, potassium 5.1, chloride 101, CO2 27, calcium 9.4, total protein 6.2, albumin 3.9, liver profile is unremarkable. White blood count is 4.59, hemoglobin is 14.9, hematocrit 44.2, platelets of 220. TSH is 1.06, PSA 1.137, carbamazepine level of 9.2.

## 2017-02-04 ENCOUNTER — Encounter (HOSPITAL_COMMUNITY): Payer: Self-pay

## 2017-02-07 ENCOUNTER — Telehealth (HOSPITAL_COMMUNITY): Payer: Self-pay | Admitting: *Deleted

## 2017-02-07 ENCOUNTER — Encounter (HOSPITAL_COMMUNITY): Admission: RE | Admit: 2017-02-07 | Payer: Self-pay | Source: Ambulatory Visit

## 2017-02-09 ENCOUNTER — Encounter (HOSPITAL_COMMUNITY)
Admission: RE | Admit: 2017-02-09 | Discharge: 2017-02-09 | Disposition: A | Discharge status: Home / Self Care | Payer: Self-pay | Source: Ambulatory Visit | Attending: Cardiovascular Disease | Admitting: Cardiovascular Disease

## 2017-02-11 ENCOUNTER — Encounter (HOSPITAL_COMMUNITY)
Admission: RE | Admit: 2017-02-11 | Discharge: 2017-02-11 | Disposition: A | Discharge status: Home / Self Care | Payer: Self-pay | Source: Ambulatory Visit | Attending: Cardiovascular Disease | Admitting: Cardiovascular Disease

## 2017-02-14 ENCOUNTER — Encounter (HOSPITAL_COMMUNITY)
Admission: RE | Admit: 2017-02-14 | Discharge: 2017-02-14 | Disposition: A | Discharge status: Home / Self Care | Payer: Self-pay | Source: Ambulatory Visit | Attending: Cardiovascular Disease | Admitting: Cardiovascular Disease

## 2017-02-16 ENCOUNTER — Telehealth: Payer: Self-pay | Admitting: Gastroenterology

## 2017-02-16 ENCOUNTER — Encounter (HOSPITAL_COMMUNITY)
Admission: RE | Admit: 2017-02-16 | Discharge: 2017-02-16 | Disposition: A | Discharge status: Home / Self Care | Payer: Self-pay | Source: Ambulatory Visit | Attending: Cardiovascular Disease | Admitting: Cardiovascular Disease

## 2017-02-16 MED ORDER — OMEPRAZOLE 20 MG PO CPDR: 20.0000 mg | DELAYED_RELEASE_CAPSULE | Freq: Every day | ORAL | 1 refills | Status: DC

## 2017-02-16 NOTE — Telephone Encounter (Signed)
Medication refilled for 2 months and appt scheduled for 04/26/17 at 3:45.

## 2017-02-18 ENCOUNTER — Encounter (HOSPITAL_COMMUNITY)
Admission: RE | Admit: 2017-02-18 | Discharge: 2017-02-18 | Disposition: A | Discharge status: Home / Self Care | Payer: Self-pay | Source: Ambulatory Visit | Attending: Cardiovascular Disease | Admitting: Cardiovascular Disease

## 2017-02-21 ENCOUNTER — Encounter (HOSPITAL_COMMUNITY): Payer: Self-pay

## 2017-02-23 ENCOUNTER — Encounter (HOSPITAL_COMMUNITY)
Admission: RE | Admit: 2017-02-23 | Discharge: 2017-02-23 | Disposition: A | Discharge status: Home / Self Care | Payer: Self-pay | Source: Ambulatory Visit | Attending: Cardiovascular Disease | Admitting: Cardiovascular Disease

## 2017-02-25 ENCOUNTER — Encounter (HOSPITAL_COMMUNITY)
Admission: RE | Admit: 2017-02-25 | Discharge: 2017-02-25 | Disposition: A | Discharge status: Home / Self Care | Payer: Self-pay | Source: Ambulatory Visit | Attending: Cardiovascular Disease | Admitting: Cardiovascular Disease

## 2017-03-02 ENCOUNTER — Encounter (HOSPITAL_COMMUNITY)
Admission: RE | Admit: 2017-03-02 | Discharge: 2017-03-02 | Disposition: A | Discharge status: Home / Self Care | Payer: Self-pay | Source: Ambulatory Visit | Attending: Cardiovascular Disease | Admitting: Cardiovascular Disease

## 2017-03-02 DIAGNOSIS — I214 Non-ST elevation (NSTEMI) myocardial infarction: Secondary | ICD-10-CM | POA: Insufficient documentation

## 2017-03-04 ENCOUNTER — Encounter (HOSPITAL_COMMUNITY)
Admission: RE | Admit: 2017-03-04 | Discharge: 2017-03-04 | Disposition: A | Discharge status: Home / Self Care | Payer: Self-pay | Source: Ambulatory Visit | Attending: Cardiovascular Disease | Admitting: Cardiovascular Disease

## 2017-03-07 ENCOUNTER — Encounter (HOSPITAL_COMMUNITY): Payer: Self-pay

## 2017-03-09 ENCOUNTER — Encounter (HOSPITAL_COMMUNITY)
Admission: RE | Admit: 2017-03-09 | Discharge: 2017-03-09 | Disposition: A | Discharge status: Home / Self Care | Payer: Self-pay | Source: Ambulatory Visit | Attending: Cardiovascular Disease | Admitting: Cardiovascular Disease

## 2017-03-11 ENCOUNTER — Encounter (HOSPITAL_COMMUNITY): Payer: Self-pay

## 2017-03-14 ENCOUNTER — Encounter (HOSPITAL_COMMUNITY)
Admission: RE | Admit: 2017-03-14 | Discharge: 2017-03-14 | Disposition: A | Discharge status: Home / Self Care | Payer: Self-pay | Source: Ambulatory Visit | Attending: Cardiovascular Disease | Admitting: Cardiovascular Disease

## 2017-03-16 ENCOUNTER — Encounter (HOSPITAL_COMMUNITY)
Admission: RE | Admit: 2017-03-16 | Discharge: 2017-03-16 | Disposition: A | Discharge status: Home / Self Care | Payer: Self-pay | Source: Ambulatory Visit | Attending: Cardiovascular Disease | Admitting: Cardiovascular Disease

## 2017-03-18 ENCOUNTER — Encounter (HOSPITAL_COMMUNITY): Payer: Self-pay

## 2017-03-21 ENCOUNTER — Encounter (HOSPITAL_COMMUNITY)
Admission: RE | Admit: 2017-03-21 | Discharge: 2017-03-21 | Disposition: A | Discharge status: Home / Self Care | Payer: Self-pay | Source: Ambulatory Visit | Attending: Cardiovascular Disease | Admitting: Cardiovascular Disease

## 2017-03-23 ENCOUNTER — Encounter (HOSPITAL_COMMUNITY)
Admission: RE | Admit: 2017-03-23 | Discharge: 2017-03-23 | Disposition: A | Discharge status: Home / Self Care | Payer: Self-pay | Source: Ambulatory Visit | Attending: Cardiovascular Disease | Admitting: Cardiovascular Disease

## 2017-03-25 ENCOUNTER — Encounter (HOSPITAL_COMMUNITY)
Admission: RE | Admit: 2017-03-25 | Discharge: 2017-03-25 | Disposition: A | Discharge status: Home / Self Care | Payer: Self-pay | Source: Ambulatory Visit | Attending: Cardiovascular Disease | Admitting: Cardiovascular Disease

## 2017-03-28 ENCOUNTER — Encounter (HOSPITAL_COMMUNITY)
Admission: RE | Admit: 2017-03-28 | Discharge: 2017-03-28 | Disposition: A | Discharge status: Home / Self Care | Payer: Self-pay | Source: Ambulatory Visit | Attending: Cardiovascular Disease | Admitting: Cardiovascular Disease

## 2017-03-28 DIAGNOSIS — I214 Non-ST elevation (NSTEMI) myocardial infarction: Secondary | ICD-10-CM | POA: Insufficient documentation

## 2017-03-30 ENCOUNTER — Encounter (HOSPITAL_COMMUNITY)
Admission: RE | Admit: 2017-03-30 | Discharge: 2017-03-30 | Disposition: A | Discharge status: Home / Self Care | Payer: Self-pay | Source: Ambulatory Visit | Attending: Cardiovascular Disease | Admitting: Cardiovascular Disease

## 2017-04-01 ENCOUNTER — Encounter (HOSPITAL_COMMUNITY)
Admission: RE | Admit: 2017-04-01 | Discharge: 2017-04-01 | Disposition: A | Discharge status: Home / Self Care | Payer: Self-pay | Source: Ambulatory Visit | Attending: Cardiovascular Disease | Admitting: Cardiovascular Disease

## 2017-04-04 ENCOUNTER — Encounter (HOSPITAL_COMMUNITY)
Admission: RE | Admit: 2017-04-04 | Discharge: 2017-04-04 | Disposition: A | Discharge status: Home / Self Care | Payer: Self-pay | Source: Ambulatory Visit | Attending: Cardiovascular Disease | Admitting: Cardiovascular Disease

## 2017-04-06 ENCOUNTER — Encounter (HOSPITAL_COMMUNITY)
Admission: RE | Admit: 2017-04-06 | Discharge: 2017-04-06 | Disposition: A | Discharge status: Home / Self Care | Payer: Self-pay | Source: Ambulatory Visit | Attending: Cardiovascular Disease | Admitting: Cardiovascular Disease

## 2017-04-08 ENCOUNTER — Encounter (HOSPITAL_COMMUNITY)
Admission: RE | Admit: 2017-04-08 | Discharge: 2017-04-08 | Disposition: A | Discharge status: Home / Self Care | Payer: Self-pay | Source: Ambulatory Visit | Attending: Cardiovascular Disease | Admitting: Cardiovascular Disease

## 2017-04-11 ENCOUNTER — Encounter (HOSPITAL_COMMUNITY)
Admission: RE | Admit: 2017-04-11 | Discharge: 2017-04-11 | Disposition: A | Discharge status: Home / Self Care | Payer: Self-pay | Source: Ambulatory Visit | Attending: Cardiovascular Disease | Admitting: Cardiovascular Disease

## 2017-04-13 ENCOUNTER — Encounter (HOSPITAL_COMMUNITY)
Admission: RE | Admit: 2017-04-13 | Discharge: 2017-04-13 | Disposition: A | Discharge status: Home / Self Care | Payer: Self-pay | Source: Ambulatory Visit | Attending: Cardiovascular Disease | Admitting: Cardiovascular Disease

## 2017-04-15 ENCOUNTER — Encounter (HOSPITAL_COMMUNITY)
Admission: RE | Admit: 2017-04-15 | Discharge: 2017-04-15 | Disposition: A | Discharge status: Home / Self Care | Payer: Self-pay | Source: Ambulatory Visit | Attending: Cardiovascular Disease | Admitting: Cardiovascular Disease

## 2017-04-18 ENCOUNTER — Other Ambulatory Visit: Payer: Self-pay | Admitting: Gastroenterology

## 2017-04-18 ENCOUNTER — Encounter (HOSPITAL_COMMUNITY)
Admission: RE | Admit: 2017-04-18 | Discharge: 2017-04-18 | Disposition: A | Discharge status: Home / Self Care | Payer: Self-pay | Source: Ambulatory Visit | Attending: Cardiovascular Disease | Admitting: Cardiovascular Disease

## 2017-04-20 ENCOUNTER — Encounter (HOSPITAL_COMMUNITY): Payer: Self-pay

## 2017-04-22 ENCOUNTER — Encounter (HOSPITAL_COMMUNITY)
Admission: RE | Admit: 2017-04-22 | Discharge: 2017-04-22 | Disposition: A | Discharge status: Home / Self Care | Payer: Self-pay | Source: Ambulatory Visit | Attending: Cardiovascular Disease | Admitting: Cardiovascular Disease

## 2017-04-25 ENCOUNTER — Encounter (HOSPITAL_COMMUNITY): Payer: Self-pay

## 2017-04-26 ENCOUNTER — Encounter: Payer: Self-pay | Admitting: Gastroenterology

## 2017-04-26 ENCOUNTER — Ambulatory Visit (INDEPENDENT_AMBULATORY_CARE_PROVIDER_SITE_OTHER): Payer: Medicare Other | Admitting: Gastroenterology

## 2017-04-26 VITALS — BP 112/60 | HR 84 | Ht 70.25 in | Wt 231.0 lb

## 2017-04-26 DIAGNOSIS — K219 Gastro-esophageal reflux disease without esophagitis: Secondary | ICD-10-CM

## 2017-04-26 DIAGNOSIS — Z1211 Encounter for screening for malignant neoplasm of colon: Secondary | ICD-10-CM

## 2017-04-26 DIAGNOSIS — K222 Esophageal obstruction: Secondary | ICD-10-CM

## 2017-04-26 MED ORDER — OMEPRAZOLE 20 MG PO CPDR: 20.0000 mg | DELAYED_RELEASE_CAPSULE | Freq: Every day | ORAL | 3 refills | Status: DC

## 2017-04-26 NOTE — Patient Instructions (Addendum)
If you have recurrent swallowing problems again, please call and will plan on repeat EGD with dilation.  You should stay on omeprazole once daily, best taken 20-30 min prior to breakfast meal.  New script written 20mg  pill omeprazole, 90 pills, three refills. Will refill again in one year without need to office visit.  Screening colonoscopy January 2019 at [facility] (on home oxygen).  Normal BMI (Body Mass Index- based on height and weight) is between 19 and 25. Your BMI today is Body mass index is 32.91 kg/m. [name] Please consider follow up  regarding your BMI with your Primary Care Provider.

## 2017-04-26 NOTE — Progress Notes (Signed)
Review of pertinent gastrointestinal problems: 1. 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 Christella HartiganJacobs, focal peptic stricture versus thick Schatzki's ring was dilated with balloon successfully.  He was put on proton pump inhibitor daily afterwards 2.  Routine risk for colon cancer, colonoscopy January 2009 Dr. Christella HartiganJacobs found no polyps.  Recommended repeat colonoscopy at 10-year interval.  HPI: This is a very pleasant 51 year old man whom I last saw about 2 years ago at the time of an upper endoscopy.  See those results above  Chief complaint is routine risk for colon cancer, mild GERD  Swallows fine, no dysphagia or odynophagia.  He had some chest pains last week after a lot of heavy lifting.  He attends cardiac rehab regularly.    He has very mild pyrosis and acid regurg which is well treated on omeprazole once daily  Home oxygen at night  His mother had esophageal esophageal stricture that required dilation  Stable weight.  No overt gi bleeding.  No troubles with his bowels  ROS: complete GI ROS as described in HPI, all other review negative.  Constitutional:  No unintentional weight loss   Past Medical History:  Diagnosis Date  . Anxiety   . CAD (coronary artery disease)    Cath 2011.  LAD stent patent, distal LAD occlusion, D1 100%, OM branch 80 - 90%, SVG to diag patent, LIMA to LAD occluded, SVG to RCA occluded, SVG to OM was occluded.   No change from previous cath.  Managed medically.   . Cataract   . Depression   . Diabetes mellitus type I (HCC)    on insulin pump at  home  . Diabetic retinopathy    s/p vitrectomy and history of retinal surgery  . Hemorrhoid   . History of oxygen administration    nocutural use only at 2 l/m nasally.  [name]. HTN (hypertension)   . Hyperlipidemia   . Myocardial infarction (HCC)   . 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 Seasonal allergic rhinitis   . Seizure  disorder (HCC)   . Sleep apnea    s/p oral surgery  . Toe fracture, right    11-23-14 fracture right big toe-wearing a stability shoe    Past Surgical History:  Procedure Laterality Date  . APPENDECTOMY    . CARDIAC CATHETERIZATION    . CATARACT EXTRACTION, BILATERAL    . CORONARY ANGIOPLASTY     9'11,  CABPG grafts had failed.  . CORONARY ARTERY BYPASS GRAFT     4 vessel 4/11  . ESOPHAGOGASTRODUODENOSCOPY (EGD) WITH PROPOFOL N/A 12/05/2014   Procedure: ESOPHAGOGASTRODUODENOSCOPY (EGD) WITH PROPOFOL;  Surgeon: [name], MD;  Location: [facility];  Service: Endoscopy;  Laterality: N/A;  . LAPAROSCOPIC APPENDECTOMY  11/24/2011  . LEFT HEART CATHETERIZATION WITH CORONARY/GRAFT ANGIOGRAM N/A 02/22/2013   Procedure: LEFT HEART CATHETERIZATION WITH Isabel CapriceORONARY/GRAFT ANGIOGRAM;  Surgeon: Tonny BollmanMichael Cooper, MD;  [facility];  Service: Cardiovascular;  Laterality: N/A;  . release of right transverse carpal ligament    . right eye vitrectomy and detached retina repair  10/2007  . TONSILLECTOMY    . uvulopalatoplasty surgery      Current Outpatient Prescriptions  Medication Sig Dispense Refill  . acetaminophen (TYLENOL) 500 MG tablet Take 1,000 mg by mouth every 6 (six) hours as needed for mild pain.    [name]. aspirin 81 MG tablet Take 81 mg by mouth daily.     .[name]  carbamazepine (TEGRETOL XR) 200 MG 12 hr tablet Take 3 tablets (600 mg total) by mouth 2 (two) times daily. Brand is medically necessary. 540 tablet 3  . Cholecalciferol (VITAMIN D) 2000 units CAPS Take 2,000 Units by mouth daily.     [name] desvenlafaxine (PRISTIQ) 100 MG 24 hr tablet Take 100 mg by mouth daily.    [name] docusate sodium (COLACE) 100 MG capsule Take 100 mg by mouth daily.     [name] ezetimibe (ZETIA) 10 MG tablet Take 10 mg by mouth at bedtime.     . Insulin Human (INSULIN PUMP) 100 unit/ml SOLN Inject 1 each into the skin continuous. "humalog"    . isosorbide mononitrate (IMDUR) 60 MG 24 hr tablet Take 60 mg by mouth every  morning.     . levETIRAcetam (KEPPRA) 250 MG tablet Take 250 mg by mouth every morning. Take 500mg  by mouth every evening    . lisinopril (PRINIVIL,ZESTRIL) 5 MG tablet Take 5 mg by mouth every morning.     . metoprolol tartrate (LOPRESSOR) 25 MG tablet Take 1 tablet (25 mg total) by mouth 2 (two) times daily. 60 tablet 11  . Multiple Vitamins-Minerals (CENTRUM SILVER 50+MEN) TABS Take 1 tablet by mouth every morning.    [name] omeprazole (PRILOSEC) 20 MG capsule TAKE 1 CAPSULE(20 MG) BY MOUTH DAILY 30 capsule 0  . OXYGEN Place 2 L into the nose at bedtime.    . rosuvastatin (CRESTOR) 40 MG tablet Take 40 mg by mouth at bedtime.     . vitamin B-12 (CYANOCOBALAMIN) 500 MCG tablet Take 500 mcg by mouth daily.    . nitroGLYCERIN (NITROSTAT) 0.4 MG SL tablet place 1 tablet under the tongue if needed every 5 minutes for chest pain (Patient not taking: Reported on 04/26/2017) 25 tablet 3   No current facility-administered medications for this visit.     Allergies as of 04/26/2017 - Review Complete 04/26/2017  Allergen Reaction Noted  . Latex Rash 06/01/2011    Family History  Problem Relation Age of Onset  . Coronary artery disease Father 56  . Diabetes Father        type II  . Hypertension Father   . Squamous cell carcinoma Brother   . Arthritis Sister   . Crohn's disease Mother   . Hearing loss Mother     Social History   Social History  . Marital status: Married    Spouse name: Almyra Free  . Number of children: 0  . Years of education: college   Occupational History  . Unemployed     previously worked in [employer]   Social History Main Topics  . Smoking status: Never Smoker  . Smokeless tobacco: Never Used  . Alcohol use No  . Drug use: No  . Sexual activity: Yes   Other Topics Concern  . Not on file   Social History Narrative   Patient lives with his wifeLanora Manis).   Patient does not have any children.   Patient is on disability.   Patient is left-handed (writes), but  right hand with sports.   Patient has a college education.   Patient drinks 5-6 cups of caffeine daily.     Physical Exam: BP 112/60 (BP Location: Left Arm, Patient Position: Sitting, Cuff Size: Normal)   Pulse 84   Ht 5' 10.25" (1.784 m)   Wt 231 lb (104.8 kg)   BMI 32.91 kg/m  Constitutional: generally well-appearing Psychiatric: alert and oriented x3 Abdomen: soft, nontender, nondistended, no obvious ascites,  no peritoneal signs, normal bowel sounds No peripheral edema noted in lower extremities  Assessment and plan: 51 y.o. male with mild chronic GERD, history of esophageal stricture, routine risk for colon cancer  I think it is best that he stay on omeprazole once daily given his peptic versus Schatzki's ring stricture that was dilated 2 years ago.  He has no dysphagia or odynophagia since dilation 2 years ago.  We will call him in a new prescription for over-the-counter strength 20 mg omeprazole 1 pill once daily.  He is at routine risk for colon cancer and has had no specific bowel issues but will be due for repeat colon cancer screening with colonoscopy in 3 months.  Since he is on home oxygen therapy at night, not related to CPAP, he will need to be in the hospital setting outpatient.  Please see the "Patient Instructions" section for addition details about the plan.  Rob Bunting, MD [facility] 04/26/2017, 3:56 PM

## 2017-04-27 ENCOUNTER — Encounter (HOSPITAL_COMMUNITY)
Admission: RE | Admit: 2017-04-27 | Discharge: 2017-04-27 | Disposition: A | Discharge status: Home / Self Care | Payer: Self-pay | Source: Ambulatory Visit | Attending: Cardiovascular Disease | Admitting: Cardiovascular Disease

## 2017-04-29 ENCOUNTER — Encounter (HOSPITAL_COMMUNITY)
Admission: RE | Admit: 2017-04-29 | Discharge: 2017-04-29 | Disposition: A | Discharge status: Home / Self Care | Payer: Self-pay | Source: Ambulatory Visit | Attending: Cardiovascular Disease | Admitting: Cardiovascular Disease

## 2017-04-29 DIAGNOSIS — I214 Non-ST elevation (NSTEMI) myocardial infarction: Secondary | ICD-10-CM | POA: Insufficient documentation

## 2017-05-02 ENCOUNTER — Encounter (HOSPITAL_COMMUNITY)
Admission: RE | Admit: 2017-05-02 | Discharge: 2017-05-02 | Disposition: A | Discharge status: Home / Self Care | Payer: Self-pay | Source: Ambulatory Visit | Attending: Cardiovascular Disease | Admitting: Cardiovascular Disease

## 2017-05-04 ENCOUNTER — Encounter (HOSPITAL_COMMUNITY)
Admission: RE | Admit: 2017-05-04 | Discharge: 2017-05-04 | Disposition: A | Discharge status: Home / Self Care | Payer: Self-pay | Source: Ambulatory Visit | Attending: Cardiovascular Disease | Admitting: Cardiovascular Disease

## 2017-05-06 ENCOUNTER — Encounter (HOSPITAL_COMMUNITY)
Admission: RE | Admit: 2017-05-06 | Discharge: 2017-05-06 | Disposition: A | Discharge status: Home / Self Care | Payer: Self-pay | Source: Ambulatory Visit | Attending: Cardiovascular Disease | Admitting: Cardiovascular Disease

## 2017-05-09 ENCOUNTER — Encounter (HOSPITAL_COMMUNITY)
Admission: RE | Admit: 2017-05-09 | Discharge: 2017-05-09 | Disposition: A | Discharge status: Home / Self Care | Payer: Self-pay | Source: Ambulatory Visit | Attending: Cardiovascular Disease | Admitting: Cardiovascular Disease

## 2017-05-11 ENCOUNTER — Encounter (HOSPITAL_COMMUNITY)
Admission: RE | Admit: 2017-05-11 | Discharge: 2017-05-11 | Disposition: A | Discharge status: Home / Self Care | Payer: Self-pay | Source: Ambulatory Visit | Attending: Cardiovascular Disease | Admitting: Cardiovascular Disease

## 2017-05-12 DIAGNOSIS — F329 Major depressive disorder, single episode, unspecified: Secondary | ICD-10-CM | POA: Insufficient documentation

## 2017-05-12 DIAGNOSIS — J302 Other seasonal allergic rhinitis: Secondary | ICD-10-CM | POA: Insufficient documentation

## 2017-05-12 DIAGNOSIS — I219 Acute myocardial infarction, unspecified: Secondary | ICD-10-CM | POA: Insufficient documentation

## 2017-05-12 DIAGNOSIS — F419 Anxiety disorder, unspecified: Secondary | ICD-10-CM | POA: Insufficient documentation

## 2017-05-12 DIAGNOSIS — Z9981 Dependence on supplemental oxygen: Secondary | ICD-10-CM | POA: Insufficient documentation

## 2017-05-12 DIAGNOSIS — E109 Type 1 diabetes mellitus without complications: Secondary | ICD-10-CM | POA: Insufficient documentation

## 2017-05-12 DIAGNOSIS — K649 Unspecified hemorrhoids: Secondary | ICD-10-CM | POA: Insufficient documentation

## 2017-05-12 DIAGNOSIS — G40909 Epilepsy, unspecified, not intractable, without status epilepticus: Secondary | ICD-10-CM | POA: Insufficient documentation

## 2017-05-12 DIAGNOSIS — S92911A Unspecified fracture of right toe(s), initial encounter for closed fracture: Secondary | ICD-10-CM | POA: Insufficient documentation

## 2017-05-12 DIAGNOSIS — H269 Unspecified cataract: Secondary | ICD-10-CM | POA: Insufficient documentation

## 2017-05-12 DIAGNOSIS — F32A Depression, unspecified: Secondary | ICD-10-CM | POA: Insufficient documentation

## 2017-05-12 DIAGNOSIS — G4730 Sleep apnea, unspecified: Secondary | ICD-10-CM | POA: Insufficient documentation

## 2017-05-12 DIAGNOSIS — E785 Hyperlipidemia, unspecified: Secondary | ICD-10-CM | POA: Insufficient documentation

## 2017-05-12 DIAGNOSIS — I2510 Atherosclerotic heart disease of native coronary artery without angina pectoris: Secondary | ICD-10-CM | POA: Insufficient documentation

## 2017-05-13 ENCOUNTER — Encounter (HOSPITAL_COMMUNITY)
Admission: RE | Admit: 2017-05-13 | Discharge: 2017-05-13 | Disposition: A | Discharge status: Home / Self Care | Payer: Self-pay | Source: Ambulatory Visit | Attending: Cardiovascular Disease | Admitting: Cardiovascular Disease

## 2017-05-16 ENCOUNTER — Encounter (HOSPITAL_COMMUNITY)
Admission: RE | Admit: 2017-05-16 | Discharge: 2017-05-16 | Disposition: A | Discharge status: Home / Self Care | Payer: Self-pay | Source: Ambulatory Visit | Attending: Cardiovascular Disease | Admitting: Cardiovascular Disease

## 2017-05-18 ENCOUNTER — Encounter (HOSPITAL_COMMUNITY)
Admission: RE | Admit: 2017-05-18 | Discharge: 2017-05-18 | Disposition: A | Discharge status: Home / Self Care | Payer: Self-pay | Source: Ambulatory Visit | Attending: Cardiovascular Disease | Admitting: Cardiovascular Disease

## 2017-05-18 ENCOUNTER — Other Ambulatory Visit: Payer: Self-pay

## 2017-05-18 DIAGNOSIS — I10 Essential (primary) hypertension: Secondary | ICD-10-CM

## 2017-05-18 DIAGNOSIS — I25719 Atherosclerosis of autologous vein coronary artery bypass graft(s) with unspecified angina pectoris: Secondary | ICD-10-CM

## 2017-05-18 DIAGNOSIS — E785 Hyperlipidemia, unspecified: Secondary | ICD-10-CM

## 2017-05-18 MED ORDER — METOPROLOL TARTRATE 25 MG PO TABS: 25.0000 mg | ORAL_TABLET | Freq: Two times a day (BID) | ORAL | 1 refills | Status: DC

## 2017-05-23 ENCOUNTER — Encounter (HOSPITAL_COMMUNITY)
Admission: RE | Admit: 2017-05-23 | Discharge: 2017-05-23 | Disposition: A | Discharge status: Home / Self Care | Payer: Self-pay | Source: Ambulatory Visit | Attending: Cardiovascular Disease | Admitting: Cardiovascular Disease

## 2017-05-25 ENCOUNTER — Ambulatory Visit: Payer: Medicare Other | Admitting: Cardiovascular Disease

## 2017-05-25 ENCOUNTER — Encounter: Payer: Self-pay | Admitting: Cardiovascular Disease

## 2017-05-25 ENCOUNTER — Encounter (HOSPITAL_COMMUNITY)
Admission: RE | Admit: 2017-05-25 | Discharge: 2017-05-25 | Disposition: A | Discharge status: Home / Self Care | Payer: Self-pay | Source: Ambulatory Visit | Attending: Cardiovascular Disease | Admitting: Cardiovascular Disease

## 2017-05-25 VITALS — BP 128/72 | HR 76 | Ht 71.0 in | Wt 233.0 lb

## 2017-05-25 DIAGNOSIS — E785 Hyperlipidemia, unspecified: Secondary | ICD-10-CM | POA: Diagnosis not present

## 2017-05-25 DIAGNOSIS — I2510 Atherosclerotic heart disease of native coronary artery without angina pectoris: Secondary | ICD-10-CM | POA: Diagnosis not present

## 2017-05-25 DIAGNOSIS — R9431 Abnormal electrocardiogram [ECG] [EKG]: Secondary | ICD-10-CM

## 2017-05-25 DIAGNOSIS — G4733 Obstructive sleep apnea (adult) (pediatric): Secondary | ICD-10-CM

## 2017-05-25 NOTE — Patient Instructions (Signed)

## 2017-05-25 NOTE — Progress Notes (Signed)
Cardiology Office Note Date:  05/28/2017   ID:  Jeff [name], DOB Nov 30, 1965, MRN [id number]  PCP:  Rodrigo Ran, MD  Cardiologist:  Tonny Bollman, MD    Chief Complaint  Patient presents with  . Fatigue     History of Present Illness: Jeff [name] is a 51 y.o. male who presents for  follow-up of CAD.   The patient is followed for coronary artery disease with history of CABG in 2011 and early bypass graft failure. He was treated with multivessel PCI following CABG (Native LAD and SVG-diagonal graft were stented, all other grafts occluded). His last heart catheterization in 2014 was performed when he presented with  worsening chest discomfort. This showed continued patency of his stent sites and continued patency of his diagonal vein graft. There was a pattern of diffuse small vessel disease noted and medical therapy was continued. LV function has been normal. He has participated in the maintenance phase of cardiac rehab now for several years.  He is exercising 3 days per week with the maintenance phase of cardiac rehab. He plans to add another day of exercise each week. He was just seen by Dr Waynard Edwards earlier this week. Notes A1C was up 0.2 mg/dL from previous at 7.4 mg/dL.   No new symptoms. No chest pain, shortness of breath with his normal activities. No palpitations, edema, orthopnea, or PND.   Past Medical History:  Diagnosis Date  . Anxiety   . CAD (coronary artery disease)    Cath 2011.  LAD stent patent, distal LAD occlusion, D1 100%, OM branch 80 - 90%, SVG to diag patent, LIMA to LAD occluded, SVG to RCA occluded, SVG to OM was occluded.   No change from previous cath.  Managed medically.   . Cataract   . Depression   . Diabetes mellitus type I (HCC)    on insulin pump at  home  . Diabetic retinopathy    s/p vitrectomy and history of retinal surgery  . Hemorrhoid   . History of oxygen administration    nocutural use only at 2 l/m nasally.  [name] HTN (hypertension)     . Hyperlipidemia   . Myocardial infarction (HCC)   . 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 Seasonal allergic rhinitis   . Seizure disorder (HCC)   . Sleep apnea    s/p oral surgery  . Toe fracture, right    11-23-14 fracture right big toe-wearing a stability shoe    Past Surgical History:  Procedure Laterality Date  . APPENDECTOMY    . CARDIAC CATHETERIZATION    . CATARACT EXTRACTION, BILATERAL    . CORONARY ANGIOPLASTY     9'11,  CABPG grafts had failed.  . CORONARY ARTERY BYPASS GRAFT     4 vessel 4/11  . ESOPHAGOGASTRODUODENOSCOPY (EGD) WITH PROPOFOL N/A 12/05/2014   Procedure: ESOPHAGOGASTRODUODENOSCOPY (EGD) WITH PROPOFOL;  Surgeon: [name], MD;  Location: [facility];  Service: Endoscopy;  Laterality: N/A;  . LAPAROSCOPIC APPENDECTOMY  11/24/2011  . LEFT HEART CATHETERIZATION WITH CORONARY/GRAFT ANGIOGRAM N/A 02/22/2013   Procedure: LEFT HEART CATHETERIZATION WITH [name];  Surgeon: Tonny Bollman, MD;  [facility]: [facility] C[facility];  Service: Cardiovascular;  Laterality: N/A;  . release of right transverse carpal ligament    . right eye vitrectomy and detached retina repair  10/2007  . TONSILLECTOMY    . uvulopalatoplasty surgery      Current Outpatient Medications  Medication Sig Dispense  Refill  . acetaminophen (TYLENOL) 500 MG tablet Take 1,000 mg by mouth every 6 (six) hours as needed for mild pain.    [name]. aspirin 81 MG tablet Take 81 mg by mouth daily.     . carbamazepine (TEGRETOL XR) 200 MG 12 hr tablet Take 3 tablets (600 mg total) by mouth 2 (two) times daily. Brand is medically necessary. 540 tablet 3  . Cholecalciferol (VITAMIN D) 2000 units CAPS Take 2,000 Units by mouth daily.     [name]. desvenlafaxine (PRISTIQ) 100 MG 24 hr tablet Take 100 mg by mouth daily.    [name]. docusate sodium (COLACE) 100 MG capsule Take 100 mg by mouth daily.     [name]. ezetimibe (ZETIA) 10 MG tablet Take 10 mg by mouth at bedtime.     . Insulin  Human (INSULIN PUMP) 100 unit/ml SOLN Inject 1 each into the skin continuous. "humalog"    . isosorbide mononitrate (IMDUR) 60 MG 24 hr tablet Take 60 mg by mouth every morning.     . levETIRAcetam (KEPPRA) 500 MG tablet Take 250-500 mg by mouth 2 (two) times daily.    [name]. lisinopril (PRINIVIL,ZESTRIL) 5 MG tablet Take 5 mg by mouth every morning.     . metoprolol tartrate (LOPRESSOR) 25 MG tablet Take 1 tablet (25 mg total) by mouth 2 (two) times daily. 180 tablet 1  . Multiple Vitamins-Minerals (CENTRUM SILVER 50+MEN) TABS Take 1 tablet by mouth every morning.    . nitroGLYCERIN (NITROSTAT) 0.4 MG SL tablet place 1 tablet under the tongue if needed every 5 minutes for chest pain 25 tablet 3  . omeprazole (PRILOSEC) 20 MG capsule Take 1 capsule (20 mg total) by mouth daily. 90 capsule 3  . OXYGEN Place 2 L into the nose at bedtime.    . rosuvastatin (CRESTOR) 40 MG tablet Take 40 mg by mouth at bedtime.     . vitamin B-12 (CYANOCOBALAMIN) 500 MCG tablet Take 500 mcg by mouth daily.     No current facility-administered medications for this visit.     Allergies:   Latex   Social History:  The patient  reports that  has never smoked. he has never used smokeless tobacco. He reports that he does not drink alcohol or use drugs.   Family History:  The patient's family history includes Arthritis in his sister; Coronary artery disease (age of onset: 1950) in his father; Crohn's disease in his mother; Diabetes in his father; Hearing loss in his mother; Hypertension in his father; Squamous cell carcinoma in his brother.    ROS:  Please see the history of present illness.  Otherwise, review of systems is positive for depression, snoring, anxiety, visual changes.  All other systems are reviewed and negative.    PHYSICAL EXAM: VS:  BP 128/72   Pulse 76   Ht 5\' 11"  (1.803 m)   Wt 233 lb (105.7 kg)   BMI 32.50 kg/m  , BMI Body mass index is 32.5 kg/m. GEN: Well nourished, well developed, in no acute  distress  HEENT: normal  Neck: no JVD, no masses. No carotid bruits Cardiac: RRR without murmur or gallop     Respiratory:  clear to auscultation bilaterally, normal work of breathing GI: soft, nontender, nondistended, + BS MS: no deformity or atrophy  Ext: no pretibial edema, pedal pulses 2+= bilaterally Skin: warm and dry, no rash Neuro:  Strength and sensation are intact Psych: euthymic mood, full affect  EKG:  EKG is ordered today. The ekg  ordered today shows NSR 76 bpm, incomplete RBBB, no change from baseline. No change from prior tracings.   Recent Labs: No results found for requested labs within last 8760 hours.   Lipid Panel     Component Value Date/Time   CHOL 138 06/03/2011 0640   TRIG 111 06/03/2011 0640   HDL 42 06/03/2011 0640   CHOLHDL 3.3 06/03/2011 0640   VLDL 22 06/03/2011 0640   LDLCALC 74 06/03/2011 0640      Wt Readings from Last 3 Encounters:  05/25/17 233 lb (105.7 kg)  04/26/17 231 lb (104.8 kg)  11/15/16 234 lb 6.4 oz (106.3 kg)     Cardiac Studies Reviewed: Echo 11-30-2016: Left ventricle:  The cavity size was normal. There was mild concentric hypertrophy. Systolic function was vigorous. The estimated ejection fraction was in the range of 65% to 70%. Wall motion was normal; there were no regional wall motion abnormalities. The transmitral flow pattern was normal. The deceleration time of the early transmitral flow velocity was normal. The ratio of systolic to diastolic pulmonary vein flow was within the normal range (systolic predominant). Early diastolic septal annular tissue Doppler velocities Ea were abnormal. Doppler parameters are consistent with abnormal left ventricular relaxation (grade 1 diastolic dysfunction). Indeterminate ventricular filling pressure by Doppler parameters.  ------------------------------------------------------------------- Aortic valve:   Trileaflet; normal thickness leaflets. Mobility was not restricted.   Doppler:  Transvalvular velocity was within the normal range. There was no stenosis. There was no regurgitation.   ------------------------------------------------------------------- Aorta:  Aortic root: The aortic root was normal in size.  ------------------------------------------------------------------- Mitral valve:   Calcified annulus. Mobility was not restricted. Doppler:  Transvalvular velocity was within the normal range. There was no evidence for stenosis. There was no regurgitation.    Peak gradient (D): 3 mm Hg.  ------------------------------------------------------------------- Left atrium:  The atrium was mildly dilated.  ------------------------------------------------------------------- Right ventricle:  The cavity size was normal. Wall thickness was normal. Systolic function was normal.  ------------------------------------------------------------------- Ventricular septum:   Septal motion showed paradox. These changes are consistent with a post-thoracotomy state.  ------------------------------------------------------------------- Pulmonic valve:    The valve appears to be grossly normal. Doppler:  Transvalvular velocity was within the normal range. There was no evidence for stenosis. There was trivial regurgitation.  ------------------------------------------------------------------- Tricuspid valve:   Structurally normal valve.    Doppler: Transvalvular velocity was within the normal range. There was mild regurgitation.  ------------------------------------------------------------------- Pulmonary artery:   The main pulmonary artery was normal-sized. Systolic pressure was within the normal range.  ------------------------------------------------------------------- Right atrium:  The atrium was normal in size.  ------------------------------------------------------------------- Pericardium:  There was no pericardial effusion.   ASSESSMENT AND  PLAN: 1.  CAD, native vessel, with angina: Continues on a regimen with aspirin for antiplatelet Rx, a beta blocker, isosorbide, and a high-intensity statin. Symptoms are stable.  2. Hyperlipidemia: treated with crestor and zetia. Lipids followed by Dr Waynard Edwards.   3. Diabetes: pt is on lisinopril in the setting of CAD and diabetes.   Current medicines are reviewed with the patient today.  The patient does not have concerns regarding medicines.  Labs/ tests ordered today include:  No orders of the defined types were placed in this encounter.  Disposition:   FU 6 months  Signed, Tonny Bollman, MD  05/28/2017 10:44 PM    [facility] [address] [place], [place], [place]  [address] Phone: [phone]; Fax: [phone]

## 2017-05-27 ENCOUNTER — Encounter (HOSPITAL_COMMUNITY)
Admission: RE | Admit: 2017-05-27 | Discharge: 2017-05-27 | Disposition: A | Discharge status: Home / Self Care | Payer: Self-pay | Source: Ambulatory Visit | Attending: Cardiovascular Disease | Admitting: Cardiovascular Disease

## 2017-05-28 ENCOUNTER — Encounter: Payer: Self-pay | Admitting: Cardiovascular Disease

## 2017-05-30 ENCOUNTER — Other Ambulatory Visit: Payer: Self-pay

## 2017-05-30 ENCOUNTER — Encounter (HOSPITAL_COMMUNITY)
Admission: RE | Admit: 2017-05-30 | Discharge: 2017-05-30 | Disposition: A | Discharge status: Home / Self Care | Payer: Self-pay | Source: Ambulatory Visit | Attending: Cardiovascular Disease | Admitting: Cardiovascular Disease

## 2017-05-30 DIAGNOSIS — I2510 Atherosclerotic heart disease of native coronary artery without angina pectoris: Secondary | ICD-10-CM

## 2017-05-30 DIAGNOSIS — I214 Non-ST elevation (NSTEMI) myocardial infarction: Secondary | ICD-10-CM | POA: Insufficient documentation

## 2017-06-01 ENCOUNTER — Encounter (HOSPITAL_COMMUNITY)
Admission: RE | Admit: 2017-06-01 | Discharge: 2017-06-01 | Disposition: A | Discharge status: Home / Self Care | Payer: Self-pay | Source: Ambulatory Visit | Attending: Cardiovascular Disease | Admitting: Cardiovascular Disease

## 2017-06-03 ENCOUNTER — Encounter (HOSPITAL_COMMUNITY)
Admission: RE | Admit: 2017-06-03 | Discharge: 2017-06-03 | Disposition: A | Discharge status: Home / Self Care | Payer: Medicare Other | Source: Ambulatory Visit | Attending: Cardiovascular Disease | Admitting: Cardiovascular Disease

## 2017-06-06 ENCOUNTER — Encounter (HOSPITAL_COMMUNITY): Payer: Self-pay

## 2017-06-08 ENCOUNTER — Encounter (HOSPITAL_COMMUNITY)
Admission: RE | Admit: 2017-06-08 | Discharge: 2017-06-08 | Disposition: A | Discharge status: Home / Self Care | Payer: Self-pay | Source: Ambulatory Visit | Attending: Cardiovascular Disease | Admitting: Cardiovascular Disease

## 2017-06-10 ENCOUNTER — Encounter (HOSPITAL_COMMUNITY)
Admission: RE | Admit: 2017-06-10 | Discharge: 2017-06-10 | Disposition: A | Discharge status: Home / Self Care | Payer: Medicare Other | Source: Ambulatory Visit | Attending: Cardiovascular Disease | Admitting: Cardiovascular Disease

## 2017-06-13 ENCOUNTER — Encounter (HOSPITAL_COMMUNITY): Payer: Self-pay

## 2017-06-15 ENCOUNTER — Encounter (HOSPITAL_COMMUNITY)
Admission: RE | Admit: 2017-06-15 | Discharge: 2017-06-15 | Disposition: A | Discharge status: Home / Self Care | Payer: Medicare Other | Source: Ambulatory Visit | Attending: Cardiovascular Disease | Admitting: Cardiovascular Disease

## 2017-06-17 ENCOUNTER — Encounter (HOSPITAL_COMMUNITY)
Admission: RE | Admit: 2017-06-17 | Discharge: 2017-06-17 | Disposition: A | Discharge status: Home / Self Care | Payer: Self-pay | Source: Ambulatory Visit | Attending: Cardiovascular Disease | Admitting: Cardiovascular Disease

## 2017-06-22 ENCOUNTER — Encounter (HOSPITAL_COMMUNITY): Payer: Self-pay

## 2017-06-24 ENCOUNTER — Encounter (HOSPITAL_COMMUNITY)
Admission: RE | Admit: 2017-06-24 | Discharge: 2017-06-24 | Disposition: A | Discharge status: Home / Self Care | Payer: Self-pay | Source: Ambulatory Visit | Attending: Cardiovascular Disease | Admitting: Cardiovascular Disease

## 2017-06-27 ENCOUNTER — Encounter (HOSPITAL_COMMUNITY)
Admission: RE | Admit: 2017-06-27 | Discharge: 2017-06-27 | Disposition: A | Discharge status: Home / Self Care | Payer: Self-pay | Source: Ambulatory Visit | Attending: Cardiovascular Disease | Admitting: Cardiovascular Disease

## 2017-06-29 ENCOUNTER — Encounter (HOSPITAL_COMMUNITY)
Admission: RE | Admit: 2017-06-29 | Discharge: 2017-06-29 | Disposition: A | Discharge status: Home / Self Care | Payer: Self-pay | Source: Ambulatory Visit | Attending: Cardiovascular Disease | Admitting: Cardiovascular Disease

## 2017-06-29 DIAGNOSIS — I214 Non-ST elevation (NSTEMI) myocardial infarction: Secondary | ICD-10-CM | POA: Insufficient documentation

## 2017-07-01 ENCOUNTER — Encounter (HOSPITAL_COMMUNITY)
Admission: RE | Admit: 2017-07-01 | Discharge: 2017-07-01 | Disposition: A | Discharge status: Home / Self Care | Payer: Self-pay | Source: Ambulatory Visit | Attending: Cardiovascular Disease | Admitting: Cardiovascular Disease

## 2017-07-04 ENCOUNTER — Telehealth: Payer: Self-pay

## 2017-07-04 ENCOUNTER — Encounter (HOSPITAL_COMMUNITY)
Admission: RE | Admit: 2017-07-04 | Discharge: 2017-07-04 | Disposition: A | Discharge status: Home / Self Care | Payer: Self-pay | Source: Ambulatory Visit | Attending: Cardiovascular Disease | Admitting: Cardiovascular Disease

## 2017-07-04 NOTE — Telephone Encounter (Signed)
-----   Message from Brandt Loosen, RN sent at 04/26/2017  4:20 PM EDT ----- Screening colonoscopy January 2019 at [facility] (on home oxygen).

## 2017-07-05 ENCOUNTER — Other Ambulatory Visit: Payer: Self-pay

## 2017-07-05 DIAGNOSIS — Z1211 Encounter for screening for malignant neoplasm of colon: Secondary | ICD-10-CM

## 2017-07-05 MED ORDER — PEG 3350-KCL-NA BICARB-NACL 420 G PO SOLR: 4000.0000 mL | Freq: Once | ORAL | 0 refills | Status: AC

## 2017-07-05 NOTE — Telephone Encounter (Signed)
Left message on machine to call back colon on 07/21/17 1030 am instructions mailed to the home

## 2017-07-06 ENCOUNTER — Encounter (HOSPITAL_COMMUNITY)
Admission: RE | Admit: 2017-07-06 | Discharge: 2017-07-06 | Disposition: A | Discharge status: Home / Self Care | Payer: Self-pay | Source: Ambulatory Visit | Attending: Cardiovascular Disease | Admitting: Cardiovascular Disease

## 2017-07-06 NOTE — Telephone Encounter (Signed)
Patient returned phone call. Best # [phone]

## 2017-07-06 NOTE — Telephone Encounter (Signed)
See phone note

## 2017-07-06 NOTE — Telephone Encounter (Signed)
Left message on machine to call back  

## 2017-07-08 ENCOUNTER — Encounter (HOSPITAL_COMMUNITY)
Admission: RE | Admit: 2017-07-08 | Discharge: 2017-07-08 | Disposition: A | Discharge status: Home / Self Care | Payer: Self-pay | Source: Ambulatory Visit | Attending: Cardiovascular Disease | Admitting: Cardiovascular Disease

## 2017-07-08 ENCOUNTER — Telehealth: Payer: Self-pay | Admitting: Gastroenterology

## 2017-07-08 NOTE — Telephone Encounter (Signed)
error 

## 2017-07-08 NOTE — Telephone Encounter (Signed)
Colon scheduled, pt instructed and medications reviewed.  Patient instructions mailed to home.  Patient to call with any questions or concerns.  

## 2017-07-08 NOTE — Telephone Encounter (Signed)
See alternate note  

## 2017-07-11 ENCOUNTER — Encounter (HOSPITAL_COMMUNITY)
Admission: RE | Admit: 2017-07-11 | Discharge: 2017-07-11 | Disposition: A | Discharge status: Home / Self Care | Payer: Self-pay | Source: Ambulatory Visit | Attending: Cardiovascular Disease | Admitting: Cardiovascular Disease

## 2017-07-11 ENCOUNTER — Encounter (HOSPITAL_COMMUNITY): Payer: Self-pay | Admitting: Emergency Medicine

## 2017-07-11 ENCOUNTER — Other Ambulatory Visit: Payer: Self-pay

## 2017-07-13 ENCOUNTER — Encounter (HOSPITAL_COMMUNITY)
Admission: RE | Admit: 2017-07-13 | Discharge: 2017-07-13 | Disposition: A | Discharge status: Home / Self Care | Payer: Self-pay | Source: Ambulatory Visit | Attending: Cardiovascular Disease | Admitting: Cardiovascular Disease

## 2017-07-15 ENCOUNTER — Encounter (HOSPITAL_COMMUNITY)
Admission: RE | Admit: 2017-07-15 | Discharge: 2017-07-15 | Disposition: A | Discharge status: Home / Self Care | Payer: Self-pay | Source: Ambulatory Visit | Attending: Cardiovascular Disease | Admitting: Cardiovascular Disease

## 2017-07-18 ENCOUNTER — Encounter (HOSPITAL_COMMUNITY)
Admission: RE | Admit: 2017-07-18 | Discharge: 2017-07-18 | Disposition: A | Discharge status: Home / Self Care | Payer: Medicare Other | Source: Ambulatory Visit | Attending: Cardiovascular Disease | Admitting: Cardiovascular Disease

## 2017-07-20 ENCOUNTER — Encounter (HOSPITAL_COMMUNITY): Payer: Self-pay

## 2017-07-21 ENCOUNTER — Encounter (HOSPITAL_COMMUNITY): Payer: Self-pay | Admitting: Emergency Medicine

## 2017-07-21 ENCOUNTER — Other Ambulatory Visit: Payer: Self-pay

## 2017-07-21 ENCOUNTER — Encounter (HOSPITAL_COMMUNITY)
Admission: RE | Disposition: A | Discharge status: Home / Self Care | Payer: Self-pay | Source: Ambulatory Visit | Attending: Gastroenterology

## 2017-07-21 ENCOUNTER — Ambulatory Visit (HOSPITAL_COMMUNITY): Payer: Medicare Other | Admitting: Anesthesiology

## 2017-07-21 ENCOUNTER — Ambulatory Visit (HOSPITAL_COMMUNITY)
Admission: RE | Admit: 2017-07-21 | Discharge: 2017-07-21 | Disposition: A | Discharge status: Home / Self Care | Payer: Medicare Other | Source: Ambulatory Visit | Attending: Gastroenterology | Admitting: Gastroenterology

## 2017-07-21 DIAGNOSIS — E119 Type 2 diabetes mellitus without complications: Secondary | ICD-10-CM | POA: Insufficient documentation

## 2017-07-21 DIAGNOSIS — I10 Essential (primary) hypertension: Secondary | ICD-10-CM | POA: Insufficient documentation

## 2017-07-21 DIAGNOSIS — Z91040 Latex allergy status: Secondary | ICD-10-CM | POA: Insufficient documentation

## 2017-07-21 DIAGNOSIS — Z1211 Encounter for screening for malignant neoplasm of colon: Secondary | ICD-10-CM

## 2017-07-21 DIAGNOSIS — K5730 Diverticulosis of large intestine without perforation or abscess without bleeding: Secondary | ICD-10-CM | POA: Diagnosis not present

## 2017-07-21 DIAGNOSIS — Z9981 Dependence on supplemental oxygen: Secondary | ICD-10-CM | POA: Insufficient documentation

## 2017-07-21 DIAGNOSIS — E11319 Type 2 diabetes mellitus with unspecified diabetic retinopathy without macular edema: Secondary | ICD-10-CM | POA: Insufficient documentation

## 2017-07-21 DIAGNOSIS — F419 Anxiety disorder, unspecified: Secondary | ICD-10-CM | POA: Diagnosis not present

## 2017-07-21 DIAGNOSIS — I252 Old myocardial infarction: Secondary | ICD-10-CM | POA: Insufficient documentation

## 2017-07-21 DIAGNOSIS — I2510 Atherosclerotic heart disease of native coronary artery without angina pectoris: Secondary | ICD-10-CM | POA: Diagnosis not present

## 2017-07-21 DIAGNOSIS — F329 Major depressive disorder, single episode, unspecified: Secondary | ICD-10-CM | POA: Diagnosis not present

## 2017-07-21 HISTORY — PX: COLONOSCOPY WITH PROPOFOL: SHX5780

## 2017-07-21 LAB — GLUCOSE, CAPILLARY: Glucose-Capillary: 162 mg/dL — ABNORMAL HIGH (ref 65–99)

## 2017-07-21 SURGERY — COLONOSCOPY WITH PROPOFOL
Anesthesia: Monitor Anesthesia Care

## 2017-07-21 MED ORDER — SODIUM CHLORIDE 0.9 % IV SOLN: INTRAVENOUS | Status: DC

## 2017-07-21 MED ORDER — PROPOFOL 10 MG/ML IV BOLUS
INTRAVENOUS | Status: DC | PRN
  Administered 2017-07-21: 50 mg via INTRAVENOUS

## 2017-07-21 MED ORDER — LACTATED RINGERS IV SOLN
INTRAVENOUS | Status: DC
  Administered 2017-07-21: 09:00:00 via INTRAVENOUS

## 2017-07-21 MED ORDER — PROPOFOL 500 MG/50ML IV EMUL
INTRAVENOUS | Status: DC | PRN
  Administered 2017-07-21: 75 ug/kg/min via INTRAVENOUS

## 2017-07-21 MED ORDER — PROPOFOL 10 MG/ML IV BOLUS
INTRAVENOUS | Status: AC
  Filled 2017-07-21: qty 40

## 2017-07-21 MED ORDER — ONDANSETRON HCL 4 MG/2ML IJ SOLN
INTRAMUSCULAR | Status: DC | PRN
  Administered 2017-07-21: 4 mg via INTRAVENOUS

## 2017-07-21 SURGICAL SUPPLY — 22 items

## 2017-07-21 NOTE — Anesthesia Preprocedure Evaluation (Addendum)
Anesthesia Evaluation  Patient identified by MRN, date of birth, ID band Patient awake    Reviewed: Allergy & Precautions, NPO status , Patient's Chart, lab work & pertinent test results, reviewed documented beta blocker date and time   Airway Mallampati: II  TM Distance: >3 FB Neck ROM: Full    Dental  (+) Dental Advisory Given   Pulmonary sleep apnea and Oxygen sleep apnea ,  Nocturnal O2 use   Pulmonary exam normal breath sounds clear to auscultation       Cardiovascular Exercise Tolerance: Good hypertension, Pt. on medications and Pt. on home beta blockers + CAD, + Past MI, + Cardiac Stents and + CABG  Normal cardiovascular exam Rhythm:Regular Rate:Normal  TTE 2018 - Mild concentric LVH. EF was in the range of 65% to 70%. Grade 1 diastolic dysfunction. Mildly dilated LA.  EKG - Inc RBBB   Neuro/Psych Seizures -,  PSYCHIATRIC DISORDERS Anxiety Depression Neurofibromatosis Type 1    GI/Hepatic negative GI ROS, Neg liver ROS,   Endo/Other  diabetes, Type 1, Insulin DependentInsulin pump; Obesity  Renal/GU negative Renal ROS  negative genitourinary   Musculoskeletal negative musculoskeletal ROS (+)   Abdominal (+) + obese,   Peds negative pediatric ROS (+)  Hematology negative hematology ROS (+)   Anesthesia Other Findings   Reproductive/Obstetrics negative OB ROS                            Anesthesia Physical  Anesthesia Plan  ASA: III  Anesthesia Plan: MAC   Post-op Pain Management:    Induction: Intravenous  PONV Risk Score and Plan: Propofol infusion and Treatment may vary due to age or medical condition  Airway Management Planned: Natural Airway  Additional Equipment: None  Intra-op Plan:   Post-operative Plan:   Informed Consent: I have reviewed the patients History and Physical, chart, labs and discussed the procedure including the risks, benefits and alternatives  for the proposed anesthesia with the patient or authorized representative who has indicated his/her understanding and acceptance.   Dental advisory given  Plan Discussed with: CRNA  Anesthesia Plan Comments:         Anesthesia Quick Evaluation

## 2017-07-21 NOTE — H&P (Signed)
HPI: This is a man as routine risk for colon cancer, here for screening colonoscopy  Chief complaint is routine risk for colon cancer  On home ox  ROS: complete GI ROS as described in HPI, all other review negative.  Constitutional:  No unintentional weight loss   Past Medical History:  Diagnosis Date  . Anxiety   . CAD (coronary artery disease)    Cath 2011.  LAD stent patent, distal LAD occlusion, D1 100%, OM branch 80 - 90%, SVG to diag patent, LIMA to LAD occluded, SVG to RCA occluded, SVG to OM was occluded.   No change from previous cath.  Managed medically.   . Cataract   . Depression   . Diabetes mellitus type I (HCC)    on insulin pump at  home  . Diabetic retinopathy    s/p vitrectomy and history of retinal surgery  . Hemorrhoid   . History of oxygen administration    nocutural use only at 2 l/m nasally.  [name] HTN (hypertension)   . Hyperlipidemia   . Myocardial infarction (HCC)   . 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 Seasonal allergic rhinitis   . Seizure disorder (HCC)   . Sleep apnea    s/p oral surgery  . Toe fracture, right    11-23-14 fracture right big toe-wearing a stability shoe    Past Surgical History:  Procedure Laterality Date  . APPENDECTOMY    . CARDIAC CATHETERIZATION    . CATARACT EXTRACTION, BILATERAL    . CORONARY ANGIOPLASTY     9'11,  CABPG grafts had failed.  . CORONARY ARTERY BYPASS GRAFT     4 vessel 4/11  . ESOPHAGOGASTRODUODENOSCOPY (EGD) WITH PROPOFOL N/A 12/05/2014   Procedure: ESOPHAGOGASTRODUODENOSCOPY (EGD) WITH PROPOFOL;  Surgeon: [name], MD;  Location: [facility];  Service: Endoscopy;  Laterality: N/A;  . LAPAROSCOPIC APPENDECTOMY  11/24/2011  . LEFT HEART CATHETERIZATION WITH CORONARY/GRAFT ANGIOGRAM N/A 02/22/2013   Procedure: LEFT HEART CATHETERIZATION WITH [name];  Surgeon: Tonny Bollman, MD;  Location: [facility] CATH LAB;  Service: Cardiovascular;  Laterality:  N/A;  . release of right transverse carpal ligament    . right eye vitrectomy and detached retina repair  10/2007  . TONSILLECTOMY    . uvulopalatoplasty surgery      Current Facility-Administered Medications  Medication Dose Route Frequency Provider Last Rate Last Dose  . lactated ringers infusion   Intravenous Continuous [name], MD 20 mL/hr at 07/21/17 6815      Allergies as of 07/05/2017 - Review Complete 05/28/2017  Allergen Reaction Noted  . Latex Rash 06/01/2011    Family History  Problem Relation Age of Onset  . Coronary artery disease Father 19  . Diabetes Father        type II  . Hypertension Father   . Squamous cell carcinoma Brother   . Arthritis Sister   . Crohn's disease Mother   . Hearing loss Mother     Social History   Socioeconomic History  . Marital status: Married    Spouse name: Almyra Free  . Number of children: 0  . Years of education: college  . Highest education level: Not on file  Social Needs  . Financial resource strain: Not on file  . Food insecurity - worry: Not on file  . Food insecurity - inability: Not on file  . Transportation needs - medical: Not on file  . Transportation needs - non-medical: Not  on file  Occupational History  . Occupation: Unemployed    Comment: previously worked in International aid/development worker  . Smoking status: Never Smoker  . Smokeless tobacco: Never Used  Substance and Sexual Activity  . Alcohol use: No  . Drug use: No  . Sexual activity: Yes  Other Topics Concern  . Not on file  Social History Narrative   Patient lives with his wifeLanora Manis).   Patient does not have any children.   Patient is on disability.   Patient is left-handed (writes), but right hand with sports.   Patient has a college education.   Patient drinks 5-6 cups of caffeine daily.     Physical Exam: BP (!) 156/74   Pulse 68   Temp 98.1 F (36.7 C) (Oral)   Resp 17   Ht 5\' 11"  (1.803 m)   Wt 234 lb (106.1 kg)   SpO2 99%   BMI  32.64 kg/m  Constitutional: generally well-appearing Psychiatric: alert and oriented x3 Abdomen: soft, nontender, nondistended, no obvious ascites, no peritoneal signs, normal bowel sounds No peripheral edema noted in lower extremities  Assessment and plan: 52 y.o. male with routine risk for colon cancer  Colonoscopy for screening today.  Please see the "Patient Instructions" section for addition details about the plan.  Rob Bunting, MD [facility] 07/21/2017, 9:03 AM

## 2017-07-21 NOTE — Anesthesia Postprocedure Evaluation (Signed)
Anesthesia Post Note  Patient: Jeff [name]  Procedure(s) Performed: COLONOSCOPY WITH PROPOFOL (N/A )     Patient location during evaluation: PACU Anesthesia Type: MAC Level of consciousness: awake and alert Pain management: pain level controlled Vital Signs Assessment: post-procedure vital signs reviewed and stable Respiratory status: spontaneous breathing, nonlabored ventilation and respiratory function stable Cardiovascular status: stable and blood pressure returned to baseline Anesthetic complications: no    Last Vitals:  Vitals:   07/21/17 0946 07/21/17 1000  BP: (!) 148/68 (!) 153/70  Pulse:    Resp: 19   Temp:    SpO2: 100% 99%    Last Pain:  Vitals:   07/21/17 0946  TempSrc: Oral                 Audry Pili

## 2017-07-21 NOTE — Transfer of Care (Signed)
Immediate Anesthesia Transfer of Care Note  Patient: Jeff [name]  Procedure(s) Performed: Procedure(s): COLONOSCOPY WITH PROPOFOL (N/A)  Patient Location: PACU  Anesthesia Type:MAC  Level of Consciousness:  sedated, patient cooperative and responds to stimulation  Airway & Oxygen Therapy:Patient Spontanous Breathing and Patient connected to face mask oxgen  Post-op Assessment:  Report given to PACU RN and Post -op Vital signs reviewed and stable  Post vital signs:  Reviewed and stable  Last Vitals:  Vitals:   07/21/17 0824 07/21/17 0946  BP: (!) 156/74 (!) 148/68  Pulse: 68   Resp: 17 19  Temp: 36.7 C   SpO2: 82% 423%    Complications: No apparent anesthesia complications

## 2017-07-21 NOTE — Op Note (Signed)
[facility] Patient Name: Jeff [name] Procedure Date: 07/21/2017 MRN: [id number] Attending MD: [name] , MD Date of Birth: 1966/06/17 CSN: [id number] Age: 52 Admit Type: Outpatient Procedure:                Colonoscopy Indications:              Screening for colorectal malignant neoplasm Providers:                [name], MD, Leandrew Koyanagi, RN,                            Kandice Robinsons, Technician Referring MD:              Medicines:                Monitored Anesthesia Care Complications:            No immediate complications. Estimated blood loss:                            None. Estimated Blood Loss:     Estimated blood loss: none. Procedure:                Pre-Anesthesia Assessment:                           - Prior to the procedure, a History and Physical                            was performed, and patient medications and                            allergies were reviewed. The patient's tolerance of                            previous anesthesia was also reviewed. The risks                            and benefits of the procedure and the sedation                            options and risks were discussed with the patient.                            All questions were answered, and informed consent                            was obtained. Prior Anticoagulants: The patient has                            taken no previous anticoagulant or antiplatelet                            agents. ASA Grade Assessment: III - A patient with                            severe  systemic disease. After reviewing the risks                            and benefits, the patient was deemed in                            satisfactory condition to undergo the procedure.                           After obtaining informed consent, the colonoscope                            was passed under direct vision. Throughout the                            procedure, the patient's  blood pressure, pulse, and                            oxygen saturations were monitored continuously. The                            EC-[id number] ([id number]) scope was introduced through                            the anus and advanced to the the cecum, identified                            by appendiceal orifice and ileocecal valve. The                            colonoscopy was performed without difficulty. The                            patient tolerated the procedure well. The quality                            of the bowel preparation was adequate. The                            ileocecal valve, appendiceal orifice, and rectum                            were photographed. Scope In: 9:26:47 AM Scope Out: 9:40:20 AM Scope Withdrawal Time: 0 hours 6 minutes 54 seconds  Total Procedure Duration: 0 hours 13 minutes 33 seconds  Findings:      A few small-mouthed diverticula were found in the left colon.      The exam was otherwise without abnormality on direct and retroflexion       views. Impression:               - Diverticulosis in the left colon.                           - The examination was otherwise normal on direct  and retroflexion views.                           - No polyps or cancers. Moderate Sedation:      N/A- Per Anesthesia Care Recommendation:           - Patient has a contact number available for                            emergencies. The signs and symptoms of potential                            delayed complications were discussed with the                            patient. Return to normal activities tomorrow.                            Written discharge instructions were provided to the                            patient.                           - Resume previous diet.                           - Continue present medications.                           - Repeat colonoscopy in 10 years for screening. Procedure Code(s):        ---  Professional ---                           [phone], Colonoscopy, flexible; diagnostic, including                            collection of specimen(s) by brushing or washing,                            when performed (separate procedure) Diagnosis Code(s):        --- Professional ---                           Z12.11, Encounter for screening for malignant                            neoplasm of colon                           K57.30, Diverticulosis of large intestine without                            perforation or abscess without bleeding CPT copyright 2016 American Medical Association. All rights reserved. The codes documented in this report are preliminary and upon coder review may  be revised to meet current compliance requirements. [name],  MD 07/21/2017 9:42:31 AM This report has been signed electronically. Number of Addenda: 0

## 2017-07-21 NOTE — Discharge Instructions (Signed)

## 2017-07-22 ENCOUNTER — Encounter (HOSPITAL_COMMUNITY): Payer: Self-pay | Admitting: Gastroenterology

## 2017-07-22 ENCOUNTER — Encounter (HOSPITAL_COMMUNITY): Payer: Self-pay

## 2017-07-25 ENCOUNTER — Encounter (HOSPITAL_COMMUNITY)
Admission: RE | Admit: 2017-07-25 | Discharge: 2017-07-25 | Disposition: A | Discharge status: Home / Self Care | Payer: Self-pay | Source: Ambulatory Visit | Attending: Cardiovascular Disease | Admitting: Cardiovascular Disease

## 2017-07-27 ENCOUNTER — Encounter (HOSPITAL_COMMUNITY)
Admission: RE | Admit: 2017-07-27 | Discharge: 2017-07-27 | Disposition: A | Discharge status: Home / Self Care | Payer: Self-pay | Source: Ambulatory Visit | Attending: Cardiovascular Disease | Admitting: Cardiovascular Disease

## 2017-07-29 ENCOUNTER — Encounter (HOSPITAL_COMMUNITY)
Admission: RE | Admit: 2017-07-29 | Discharge: 2017-07-29 | Disposition: A | Discharge status: Home / Self Care | Payer: Self-pay | Source: Ambulatory Visit | Attending: Cardiovascular Disease | Admitting: Cardiovascular Disease

## 2017-07-29 DIAGNOSIS — I214 Non-ST elevation (NSTEMI) myocardial infarction: Secondary | ICD-10-CM | POA: Insufficient documentation

## 2017-08-01 ENCOUNTER — Encounter (HOSPITAL_COMMUNITY)
Admission: RE | Admit: 2017-08-01 | Discharge: 2017-08-01 | Disposition: A | Discharge status: Home / Self Care | Payer: Self-pay | Source: Ambulatory Visit | Attending: Cardiovascular Disease | Admitting: Cardiovascular Disease

## 2017-08-03 ENCOUNTER — Encounter (HOSPITAL_COMMUNITY)
Admission: RE | Admit: 2017-08-03 | Discharge: 2017-08-03 | Disposition: A | Discharge status: Home / Self Care | Payer: Self-pay | Source: Ambulatory Visit | Attending: Cardiovascular Disease | Admitting: Cardiovascular Disease

## 2017-08-04 ENCOUNTER — Emergency Department (HOSPITAL_COMMUNITY)
Admission: EM | Admit: 2017-08-04 | Discharge: 2017-08-04 | Disposition: A | Discharge status: Home / Self Care | Payer: Medicare Other | Attending: Emergency Medicine | Admitting: Emergency Medicine

## 2017-08-04 ENCOUNTER — Encounter (HOSPITAL_COMMUNITY): Payer: Self-pay | Admitting: Emergency Medicine

## 2017-08-04 DIAGNOSIS — M545 Low back pain: Secondary | ICD-10-CM | POA: Insufficient documentation

## 2017-08-04 DIAGNOSIS — I2510 Atherosclerotic heart disease of native coronary artery without angina pectoris: Secondary | ICD-10-CM | POA: Insufficient documentation

## 2017-08-04 DIAGNOSIS — Y92410 Unspecified street and highway as the place of occurrence of the external cause: Secondary | ICD-10-CM | POA: Diagnosis not present

## 2017-08-04 DIAGNOSIS — E785 Hyperlipidemia, unspecified: Secondary | ICD-10-CM | POA: Diagnosis not present

## 2017-08-04 DIAGNOSIS — I10 Essential (primary) hypertension: Secondary | ICD-10-CM | POA: Insufficient documentation

## 2017-08-04 DIAGNOSIS — Y939 Activity, unspecified: Secondary | ICD-10-CM | POA: Diagnosis not present

## 2017-08-04 DIAGNOSIS — Z794 Long term (current) use of insulin: Secondary | ICD-10-CM | POA: Insufficient documentation

## 2017-08-04 DIAGNOSIS — Z7982 Long term (current) use of aspirin: Secondary | ICD-10-CM | POA: Diagnosis not present

## 2017-08-04 DIAGNOSIS — Y998 Other external cause status: Secondary | ICD-10-CM | POA: Diagnosis not present

## 2017-08-04 DIAGNOSIS — E119 Type 2 diabetes mellitus without complications: Secondary | ICD-10-CM | POA: Insufficient documentation

## 2017-08-04 DIAGNOSIS — I252 Old myocardial infarction: Secondary | ICD-10-CM | POA: Diagnosis not present

## 2017-08-04 DIAGNOSIS — Z79899 Other long term (current) drug therapy: Secondary | ICD-10-CM | POA: Insufficient documentation

## 2017-08-04 LAB — CBG MONITORING, ED: GLUCOSE-CAPILLARY: 239 mg/dL — AB (ref 65–99)

## 2017-08-04 MED ORDER — HYDROCODONE-ACETAMINOPHEN 5-325 MG PO TABS: 1.0000 | ORAL_TABLET | Freq: Four times a day (QID) | ORAL | 0 refills | Status: AC | PRN

## 2017-08-04 MED ORDER — ACETAMINOPHEN 325 MG PO TABS
650.0000 mg | ORAL_TABLET | Freq: Once | ORAL | Status: AC
  Administered 2017-08-04: 650 mg via ORAL
  Filled 2017-08-04: qty 2

## 2017-08-04 NOTE — ED Notes (Signed)
Patient Alert and oriented X4. Stable and ambulatory. Patient verbalized understanding of the discharge instructions.  Patient belongings were taken by the patient.  
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
## 2017-08-05 ENCOUNTER — Encounter (HOSPITAL_COMMUNITY): Payer: Self-pay

## 2017-08-08 ENCOUNTER — Encounter (HOSPITAL_COMMUNITY)
Admission: RE | Admit: 2017-08-08 | Discharge: 2017-08-08 | Disposition: A | Discharge status: Home / Self Care | Payer: Self-pay | Source: Ambulatory Visit | Attending: Cardiovascular Disease | Admitting: Cardiovascular Disease

## 2017-08-10 ENCOUNTER — Encounter (HOSPITAL_COMMUNITY)
Admission: RE | Admit: 2017-08-10 | Discharge: 2017-08-10 | Disposition: A | Discharge status: Home / Self Care | Payer: Self-pay | Source: Ambulatory Visit | Attending: Cardiovascular Disease | Admitting: Cardiovascular Disease

## 2017-08-12 ENCOUNTER — Encounter (HOSPITAL_COMMUNITY): Payer: Self-pay

## 2017-08-15 ENCOUNTER — Encounter (HOSPITAL_COMMUNITY)
Admission: RE | Admit: 2017-08-15 | Discharge: 2017-08-15 | Disposition: A | Discharge status: Home / Self Care | Payer: Self-pay | Source: Ambulatory Visit | Attending: Cardiovascular Disease | Admitting: Cardiovascular Disease

## 2017-08-17 ENCOUNTER — Encounter (HOSPITAL_COMMUNITY)
Admission: RE | Admit: 2017-08-17 | Discharge: 2017-08-17 | Disposition: A | Discharge status: Home / Self Care | Payer: Self-pay | Source: Ambulatory Visit | Attending: Cardiovascular Disease | Admitting: Cardiovascular Disease

## 2017-08-19 ENCOUNTER — Encounter (HOSPITAL_COMMUNITY)
Admission: RE | Admit: 2017-08-19 | Discharge: 2017-08-19 | Disposition: A | Discharge status: Home / Self Care | Payer: Medicare Other | Source: Ambulatory Visit | Attending: Cardiovascular Disease | Admitting: Cardiovascular Disease

## 2017-08-22 ENCOUNTER — Encounter (HOSPITAL_COMMUNITY): Payer: Self-pay

## 2017-08-24 ENCOUNTER — Encounter (HOSPITAL_COMMUNITY)
Admission: RE | Admit: 2017-08-24 | Discharge: 2017-08-24 | Disposition: A | Discharge status: Home / Self Care | Payer: Self-pay | Source: Ambulatory Visit | Attending: Cardiovascular Disease | Admitting: Cardiovascular Disease

## 2017-08-26 ENCOUNTER — Encounter (HOSPITAL_COMMUNITY)
Admission: RE | Admit: 2017-08-26 | Discharge: 2017-08-26 | Disposition: A | Discharge status: Home / Self Care | Payer: Self-pay | Source: Ambulatory Visit | Attending: Cardiovascular Disease | Admitting: Cardiovascular Disease

## 2017-08-26 DIAGNOSIS — I2510 Atherosclerotic heart disease of native coronary artery without angina pectoris: Secondary | ICD-10-CM | POA: Insufficient documentation

## 2017-08-29 ENCOUNTER — Encounter (HOSPITAL_COMMUNITY)
Admission: RE | Admit: 2017-08-29 | Discharge: 2017-08-29 | Disposition: A | Discharge status: Home / Self Care | Payer: Self-pay | Source: Ambulatory Visit | Attending: Cardiovascular Disease | Admitting: Cardiovascular Disease

## 2017-08-31 ENCOUNTER — Encounter (HOSPITAL_COMMUNITY)
Admission: RE | Admit: 2017-08-31 | Discharge: 2017-08-31 | Disposition: A | Discharge status: Home / Self Care | Payer: Self-pay | Source: Ambulatory Visit | Attending: Cardiovascular Disease | Admitting: Cardiovascular Disease

## 2017-09-02 ENCOUNTER — Encounter (HOSPITAL_COMMUNITY)
Admission: RE | Admit: 2017-09-02 | Discharge: 2017-09-02 | Disposition: A | Discharge status: Home / Self Care | Payer: Self-pay | Source: Ambulatory Visit | Attending: Cardiovascular Disease | Admitting: Cardiovascular Disease

## 2017-09-05 ENCOUNTER — Encounter (HOSPITAL_COMMUNITY)
Admission: RE | Admit: 2017-09-05 | Discharge: 2017-09-05 | Disposition: A | Discharge status: Home / Self Care | Payer: Self-pay | Source: Ambulatory Visit | Attending: Cardiovascular Disease | Admitting: Cardiovascular Disease

## 2017-09-07 ENCOUNTER — Encounter (HOSPITAL_COMMUNITY)
Admission: RE | Admit: 2017-09-07 | Discharge: 2017-09-07 | Disposition: A | Discharge status: Home / Self Care | Payer: Self-pay | Source: Ambulatory Visit | Attending: Cardiovascular Disease | Admitting: Cardiovascular Disease

## 2017-09-09 ENCOUNTER — Encounter (HOSPITAL_COMMUNITY)
Admission: RE | Admit: 2017-09-09 | Discharge: 2017-09-09 | Disposition: A | Discharge status: Home / Self Care | Payer: Self-pay | Source: Ambulatory Visit | Attending: Cardiovascular Disease | Admitting: Cardiovascular Disease

## 2017-09-12 ENCOUNTER — Encounter (HOSPITAL_COMMUNITY)
Admission: RE | Admit: 2017-09-12 | Discharge: 2017-09-12 | Disposition: A | Discharge status: Home / Self Care | Payer: Self-pay | Source: Ambulatory Visit | Attending: Cardiovascular Disease | Admitting: Cardiovascular Disease

## 2017-09-14 ENCOUNTER — Encounter (HOSPITAL_COMMUNITY)
Admission: RE | Admit: 2017-09-14 | Discharge: 2017-09-14 | Disposition: A | Discharge status: Home / Self Care | Payer: Self-pay | Source: Ambulatory Visit | Attending: Cardiovascular Disease | Admitting: Cardiovascular Disease

## 2017-09-16 ENCOUNTER — Encounter (HOSPITAL_COMMUNITY)
Admission: RE | Admit: 2017-09-16 | Discharge: 2017-09-16 | Disposition: A | Discharge status: Home / Self Care | Payer: Self-pay | Source: Ambulatory Visit | Attending: Cardiovascular Disease | Admitting: Cardiovascular Disease

## 2017-09-19 ENCOUNTER — Encounter (HOSPITAL_COMMUNITY): Payer: Self-pay

## 2017-09-21 ENCOUNTER — Encounter (HOSPITAL_COMMUNITY)
Admission: RE | Admit: 2017-09-21 | Discharge: 2017-09-21 | Disposition: A | Discharge status: Home / Self Care | Payer: Self-pay | Source: Ambulatory Visit | Attending: Cardiovascular Disease | Admitting: Cardiovascular Disease

## 2017-09-23 ENCOUNTER — Encounter (HOSPITAL_COMMUNITY)
Admission: RE | Admit: 2017-09-23 | Discharge: 2017-09-23 | Disposition: A | Discharge status: Home / Self Care | Payer: Self-pay | Source: Ambulatory Visit | Attending: Cardiovascular Disease | Admitting: Cardiovascular Disease

## 2017-09-26 ENCOUNTER — Encounter (HOSPITAL_COMMUNITY)
Admission: RE | Admit: 2017-09-26 | Discharge: 2017-09-26 | Disposition: A | Discharge status: Home / Self Care | Payer: Self-pay | Source: Ambulatory Visit | Attending: Cardiovascular Disease | Admitting: Cardiovascular Disease

## 2017-09-26 DIAGNOSIS — I2510 Atherosclerotic heart disease of native coronary artery without angina pectoris: Secondary | ICD-10-CM | POA: Insufficient documentation

## 2017-09-28 ENCOUNTER — Encounter (HOSPITAL_COMMUNITY)
Admission: RE | Admit: 2017-09-28 | Discharge: 2017-09-28 | Disposition: A | Discharge status: Home / Self Care | Payer: Medicare Other | Source: Ambulatory Visit | Attending: Cardiovascular Disease | Admitting: Cardiovascular Disease

## 2017-09-30 ENCOUNTER — Encounter (HOSPITAL_COMMUNITY): Payer: Self-pay

## 2017-10-03 ENCOUNTER — Encounter (HOSPITAL_COMMUNITY)
Admission: RE | Admit: 2017-10-03 | Discharge: 2017-10-03 | Disposition: A | Discharge status: Home / Self Care | Payer: Self-pay | Source: Ambulatory Visit | Attending: Cardiovascular Disease | Admitting: Cardiovascular Disease

## 2017-10-05 ENCOUNTER — Encounter (HOSPITAL_COMMUNITY)
Admission: RE | Admit: 2017-10-05 | Discharge: 2017-10-05 | Disposition: A | Discharge status: Home / Self Care | Payer: Self-pay | Source: Ambulatory Visit | Attending: Cardiovascular Disease | Admitting: Cardiovascular Disease

## 2017-10-07 ENCOUNTER — Encounter (HOSPITAL_COMMUNITY)
Admission: RE | Admit: 2017-10-07 | Discharge: 2017-10-07 | Disposition: A | Discharge status: Home / Self Care | Payer: Self-pay | Source: Ambulatory Visit | Attending: Cardiovascular Disease | Admitting: Cardiovascular Disease

## 2017-10-10 ENCOUNTER — Encounter (HOSPITAL_COMMUNITY)
Admission: RE | Admit: 2017-10-10 | Discharge: 2017-10-10 | Disposition: A | Discharge status: Home / Self Care | Payer: Self-pay | Source: Ambulatory Visit | Attending: Cardiovascular Disease | Admitting: Cardiovascular Disease

## 2017-10-12 ENCOUNTER — Encounter (HOSPITAL_COMMUNITY)
Admission: RE | Admit: 2017-10-12 | Discharge: 2017-10-12 | Disposition: A | Discharge status: Home / Self Care | Payer: Self-pay | Source: Ambulatory Visit | Attending: Cardiovascular Disease | Admitting: Cardiovascular Disease

## 2017-10-14 ENCOUNTER — Encounter (HOSPITAL_COMMUNITY)
Admission: RE | Admit: 2017-10-14 | Discharge: 2017-10-14 | Disposition: A | Discharge status: Home / Self Care | Payer: Self-pay | Source: Ambulatory Visit | Attending: Cardiovascular Disease | Admitting: Cardiovascular Disease

## 2017-10-17 ENCOUNTER — Encounter (HOSPITAL_COMMUNITY)
Admission: RE | Admit: 2017-10-17 | Discharge: 2017-10-17 | Disposition: A | Discharge status: Home / Self Care | Payer: Medicare Other | Source: Ambulatory Visit | Attending: Cardiovascular Disease | Admitting: Cardiovascular Disease

## 2017-10-19 ENCOUNTER — Encounter (HOSPITAL_COMMUNITY)
Admission: RE | Admit: 2017-10-19 | Discharge: 2017-10-19 | Disposition: A | Discharge status: Home / Self Care | Payer: Medicare Other | Source: Ambulatory Visit | Attending: Cardiovascular Disease | Admitting: Cardiovascular Disease

## 2017-10-21 ENCOUNTER — Encounter (HOSPITAL_COMMUNITY)
Admission: RE | Admit: 2017-10-21 | Discharge: 2017-10-21 | Disposition: A | Discharge status: Home / Self Care | Payer: Medicare Other | Source: Ambulatory Visit | Attending: Cardiovascular Disease | Admitting: Cardiovascular Disease

## 2017-10-24 ENCOUNTER — Encounter (HOSPITAL_COMMUNITY)
Admission: RE | Admit: 2017-10-24 | Discharge: 2017-10-24 | Disposition: A | Discharge status: Home / Self Care | Payer: Self-pay | Source: Ambulatory Visit | Attending: Cardiovascular Disease | Admitting: Cardiovascular Disease

## 2017-10-26 ENCOUNTER — Encounter (HOSPITAL_COMMUNITY)
Admission: RE | Admit: 2017-10-26 | Discharge: 2017-10-26 | Disposition: A | Discharge status: Home / Self Care | Payer: Self-pay | Source: Ambulatory Visit | Attending: Cardiovascular Disease | Admitting: Cardiovascular Disease

## 2017-10-26 DIAGNOSIS — I2510 Atherosclerotic heart disease of native coronary artery without angina pectoris: Secondary | ICD-10-CM | POA: Insufficient documentation

## 2017-10-28 ENCOUNTER — Encounter (HOSPITAL_COMMUNITY)
Admission: RE | Admit: 2017-10-28 | Discharge: 2017-10-28 | Disposition: A | Discharge status: Home / Self Care | Payer: Self-pay | Source: Ambulatory Visit | Attending: Cardiovascular Disease | Admitting: Cardiovascular Disease

## 2017-10-31 ENCOUNTER — Encounter: Payer: Self-pay | Admitting: Neurology

## 2017-10-31 ENCOUNTER — Other Ambulatory Visit: Payer: Self-pay

## 2017-10-31 ENCOUNTER — Ambulatory Visit: Payer: Medicare Other | Admitting: Neurology

## 2017-10-31 ENCOUNTER — Encounter (HOSPITAL_COMMUNITY): Payer: Self-pay

## 2017-10-31 VITALS — BP 118/78 | HR 87 | Ht 71.0 in | Wt 227.5 lb

## 2017-10-31 DIAGNOSIS — Q8501 Neurofibromatosis, type 1: Secondary | ICD-10-CM

## 2017-10-31 DIAGNOSIS — G40909 Epilepsy, unspecified, not intractable, without status epilepticus: Secondary | ICD-10-CM | POA: Diagnosis not present

## 2017-10-31 DIAGNOSIS — Z5181 Encounter for therapeutic drug level monitoring: Secondary | ICD-10-CM

## 2017-10-31 MED ORDER — CARBAMAZEPINE ER 200 MG PO TB12: 600.0000 mg | ORAL_TABLET | Freq: Two times a day (BID) | ORAL | 3 refills | Status: DC

## 2017-10-31 MED ORDER — LEVETIRACETAM 500 MG PO TABS: ORAL_TABLET | ORAL | 3 refills | Status: DC

## 2017-10-31 NOTE — Progress Notes (Signed)
Reason for visit: Seizures  Jeff [name] is an 52 y.o. male  History of present illness:  Jeff [name] is a 52 year old left-handed white male with a history of diabetes, neurofibromatosis type I, and a history of seizures.  The patient has not had any known seizures since last seen.  He remains on Keppra and carbamazepine.  He does report some mild problems with irritability on the Keppra.  The patient is operating a motor vehicle with a limited license, he is able to drive in town, and not at night.  The patient does have sleep apnea, he is now on oxygen at night.  If he takes a nap during the day without his CPAP, his wife will observe sleep apnea.  The patient has reported some urinary urgency at times.  The patient indicates that he fell when he slipped on loose gravel in March 2019, but he has not had any falls since that time.  He returns to this office for an evaluation.  Past Medical History:  Diagnosis Date  . Anxiety   . CAD (coronary artery disease)    Cath 2011.  LAD stent patent, distal LAD occlusion, D1 100%, OM branch 80 - 90%, SVG to diag patent, LIMA to LAD occluded, SVG to RCA occluded, SVG to OM was occluded.   No change from previous cath.  Managed medically.   . Cataract   . Depression   . Diabetes mellitus type I (HCC)    on insulin pump at  home  . Diabetic retinopathy    s/p vitrectomy and history of retinal surgery  . Hemorrhoid   . History of oxygen administration    nocutural use only at 2 l/m nasally.  [name] HTN (hypertension)   . Hyperlipidemia   . Myocardial infarction (HCC)   . 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 Seasonal allergic rhinitis   . Seizure disorder (HCC)   . Sleep apnea    s/p oral surgery  . Toe fracture, right    11-23-14 fracture right big toe-wearing a stability shoe    Past Surgical History:  Procedure Laterality Date  . APPENDECTOMY    . CARDIAC CATHETERIZATION    . CATARACT EXTRACTION,  BILATERAL    . COLONOSCOPY WITH PROPOFOL N/A 07/21/2017   Procedure: COLONOSCOPY WITH PROPOFOL;  Surgeon: Jeff Fee, MD;  Location: [facility];  Service: Endoscopy;  Laterality: N/A;  . CORONARY ANGIOPLASTY     9'11,  CABPG grafts had failed.  . CORONARY ARTERY BYPASS GRAFT     4 vessel 4/11  . ESOPHAGOGASTRODUODENOSCOPY (EGD) WITH PROPOFOL N/A 12/05/2014   Procedure: ESOPHAGOGASTRODUODENOSCOPY (EGD) WITH PROPOFOL;  Surgeon: Jeff Fee, MD;  Location: [facility];  Service: Endoscopy;  Laterality: N/A;  . LAPAROSCOPIC APPENDECTOMY  11/24/2011  . LEFT HEART CATHETERIZATION WITH CORONARY/GRAFT ANGIOGRAM N/A 02/22/2013   Procedure: LEFT HEART CATHETERIZATION WITH Jeff [name];  Surgeon: Jeff Bollman, MD;  Location: [facility] CATH LAB;  Service: Cardiovascular;  Laterality: N/A;  . release of right transverse carpal ligament    . right eye vitrectomy and detached retina repair  10/2007  . TONSILLECTOMY    . uvulopalatoplasty surgery      Family History  Problem Relation Age of Onset  . Coronary artery disease Father 68  . Diabetes Father        type II  . Hypertension Father   . Squamous cell carcinoma Brother   . Arthritis Sister   .  Crohn's disease Mother   . Hearing loss Mother     Social history:  reports that he has never smoked. He has never used smokeless tobacco. He reports that he does not drink alcohol or use drugs.    Allergies  Allergen Reactions  . Latex Rash    Medications:  Prior to Admission medications   Medication Sig Start Date End Date Taking? Authorizing Provider  acetaminophen (TYLENOL) 500 MG tablet Take 1,000 mg by mouth every 6 (six) hours as needed for mild pain.   Yes [provider]  aspirin 81 MG tablet Take 81 mg by mouth daily.    Yes [provider]  carbamazepine (TEGRETOL XR) 200 MG 12 hr tablet Take 3 tablets (600 mg total) by mouth 2 (two) times daily. Brand is medically necessary. 10/31/17  Yes Jeff Spaniel, MD  Cholecalciferol (VITAMIN D) 2000 units CAPS Take 2,000 Units by mouth daily.    Yes [provider]  desvenlafaxine (PRISTIQ) 100 MG 24 hr tablet Take 100 mg by mouth daily. 04/03/16  Yes [provider]  docusate sodium (COLACE) 100 MG capsule Take 100 mg by mouth at bedtime.    Yes [provider]  ezetimibe (ZETIA) 10 MG tablet Take 10 mg by mouth at bedtime.    Yes [provider]  insulin lispro (HUMALOG) 100 UNIT/ML injection Inject into the skin See admin instructions. Uses via Insulin Pump   Yes [provider]  isosorbide mononitrate (IMDUR) 60 MG 24 hr tablet Take 60 mg by mouth every morning.    Yes [provider]  levETIRAcetam (KEPPRA) 500 MG tablet Take 250 mg by mouth in the morning and take 500 mg by mouth at bedtime 10/31/17  Yes Jeff Spaniel, MD  lisinopril (PRINIVIL,ZESTRIL) 5 MG tablet Take 5 mg by mouth every morning.    Yes [provider]  metoprolol tartrate (LOPRESSOR) 25 MG tablet Take 1 tablet (25 mg total) by mouth 2 (two) times daily. 05/18/17  Yes Jeff Bollman, MD  Multiple Vitamins-Minerals (CENTRUM SILVER 50+MEN) TABS Take 1 tablet by mouth every morning.   Yes [provider]  nitroGLYCERIN (NITROSTAT) 0.4 MG SL tablet place 1 tablet under the tongue if needed every 5 minutes for chest pain Patient taking differently: place 0.4 mg under the tongue if needed every 5 minutes for chest pain 06/25/16  Yes Jeff Bollman, MD  OXYGEN Place 2 L into the nose at bedtime.   Yes [provider]  rosuvastatin (CRESTOR) 40 MG tablet Take 40 mg by mouth at bedtime.    Yes [provider]  vitamin B-12 (CYANOCOBALAMIN) 500 MCG tablet Take 500 mcg by mouth daily.   Yes [provider]  omeprazole (PRILOSEC) 20 MG capsule Take 1 capsule (20 mg total) by mouth daily. 04/26/17 07/25/17  Jeff Fee, MD    ROS:  Out of a complete 14 system review of symptoms, the  patient complains only of the following symptoms, and all other reviewed systems are negative.  Fatigue Hearing loss, ringing in the ears Loss of vision Insomnia, sleep apnea, snoring Urinary urgency Back pain Agitation Depression, anxiety  Blood pressure 118/78, pulse 87, height 5\' 11"  (1.803 m), weight 227 lb 8 oz (103.2 kg), SpO2 98 %.  Physical Exam  General: The patient is alert and cooperative at the time of the examination.  The patient is moderately obese.  Skin: No significant peripheral edema is noted.  The right lower extremity below  the knee is smaller than the left.   Neurologic Exam  Mental status: The patient is alert and oriented x 3 at the time of the examination. The patient has apparent normal recent and remote memory, with an apparently normal attention span and concentration ability.   Cranial nerves: Facial symmetry is present. Speech is normal, no aphasia or dysarthria is noted. Extraocular movements are full. Visual fields are full.  Motor: The patient has good strength in all 4 extremities.  Sensory examination: Soft touch sensation is symmetric on the face, arms, and legs.  Coordination: The patient has good finger-nose-finger and heel-to-shin bilaterally.  Gait and station: The patient has an unsteady tandem gait.  Romberg is negative  Reflexes: Deep tendon reflexes are symmetric.   Assessment/Plan:  1.  History of seizures  2.  Neurofibromatosis type I  The patient appears to be relatively stable with his clinical condition.  The patient will be given a prescription for Keppra and for carbamazepine, he will get blood work today, he will follow-up in 1 year.  Marlan Palau MD 10/31/2017 10:41 AM  [facility] [address] [place], [place] [address]  Phone [phone] Fax [phone]

## 2017-11-01 LAB — CBC WITH DIFFERENTIAL/PLATELET
BASOS: 0 %
Basophils Absolute: 0 10*3/uL (ref 0.0–0.2)
EOS (ABSOLUTE): 0.1 10*3/uL (ref 0.0–0.4)
Eos: 2 %
Hematocrit: 43.5 % (ref 37.5–51.0)
Hemoglobin: 14.6 g/dL (ref 13.0–17.7)
IMMATURE GRANS (ABS): 0 10*3/uL (ref 0.0–0.1)
Immature Granulocytes: 0 %
LYMPHS: 21 %
Lymphocytes Absolute: 1.2 10*3/uL (ref 0.7–3.1)
MCH: 30.9 pg (ref 26.6–33.0)
MCHC: 33.6 g/dL (ref 31.5–35.7)
MCV: 92 fL (ref 79–97)
Monocytes Absolute: 0.6 10*3/uL (ref 0.1–0.9)
Monocytes: 11 %
NEUTROS ABS: 3.8 10*3/uL (ref 1.4–7.0)
Neutrophils: 66 %
PLATELETS: 178 10*3/uL (ref 150–379)
RBC: 4.72 x10E6/uL (ref 4.14–5.80)
RDW: 13.4 % (ref 12.3–15.4)
WBC: 5.8 10*3/uL (ref 3.4–10.8)

## 2017-11-01 LAB — COMPREHENSIVE METABOLIC PANEL
A/G RATIO: 2.3 — AB (ref 1.2–2.2)
ALT: 28 IU/L (ref 0–44)
AST: 22 IU/L (ref 0–40)
Albumin: 4.2 g/dL (ref 3.5–5.5)
Alkaline Phosphatase: 90 IU/L (ref 39–117)
BUN/Creatinine Ratio: 21 — ABNORMAL HIGH (ref 9–20)
BUN: 15 mg/dL (ref 6–24)
Bilirubin Total: 0.3 mg/dL (ref 0.0–1.2)
CALCIUM: 8.7 mg/dL (ref 8.7–10.2)
CO2: 24 mmol/L (ref 20–29)
Chloride: 104 mmol/L (ref 96–106)
Creatinine, Ser: 0.72 mg/dL — ABNORMAL LOW (ref 0.76–1.27)
GFR, EST AFRICAN AMERICAN: 123 mL/min/{1.73_m2} (ref >59)
GFR, EST NON AFRICAN AMERICAN: 107 mL/min/{1.73_m2} (ref >59)
GLOBULIN, TOTAL: 1.8 g/dL (ref 1.5–4.5)
Glucose: 236 mg/dL — ABNORMAL HIGH (ref 65–99)
POTASSIUM: 4.3 mmol/L (ref 3.5–5.2)
SODIUM: 140 mmol/L (ref 134–144)
TOTAL PROTEIN: 6 g/dL (ref 6.0–8.5)

## 2017-11-01 LAB — CARBAMAZEPINE LEVEL, TOTAL: Carbamazepine (Tegretol), S: 9.5 ug/mL (ref 4.0–12.0)

## 2017-11-02 ENCOUNTER — Encounter (HOSPITAL_COMMUNITY)
Admission: RE | Admit: 2017-11-02 | Discharge: 2017-11-02 | Disposition: A | Discharge status: Home / Self Care | Payer: Self-pay | Source: Ambulatory Visit | Attending: Cardiovascular Disease | Admitting: Cardiovascular Disease

## 2017-11-04 ENCOUNTER — Encounter (HOSPITAL_COMMUNITY): Payer: Self-pay

## 2017-11-07 ENCOUNTER — Encounter (HOSPITAL_COMMUNITY)
Admission: RE | Admit: 2017-11-07 | Discharge: 2017-11-07 | Disposition: A | Discharge status: Home / Self Care | Payer: Medicare Other | Source: Ambulatory Visit | Attending: Cardiovascular Disease | Admitting: Cardiovascular Disease

## 2017-11-09 ENCOUNTER — Encounter (HOSPITAL_COMMUNITY)
Admission: RE | Admit: 2017-11-09 | Discharge: 2017-11-09 | Disposition: A | Discharge status: Home / Self Care | Payer: Self-pay | Source: Ambulatory Visit | Attending: Cardiovascular Disease | Admitting: Cardiovascular Disease

## 2017-11-11 ENCOUNTER — Encounter (HOSPITAL_COMMUNITY)
Admission: RE | Admit: 2017-11-11 | Discharge: 2017-11-11 | Disposition: A | Discharge status: Home / Self Care | Payer: Medicare Other | Source: Ambulatory Visit | Attending: Cardiovascular Disease | Admitting: Cardiovascular Disease

## 2017-11-11 ENCOUNTER — Encounter: Payer: Self-pay | Admitting: Cardiovascular Disease

## 2017-11-14 ENCOUNTER — Encounter (HOSPITAL_COMMUNITY)
Admission: RE | Admit: 2017-11-14 | Discharge: 2017-11-14 | Disposition: A | Discharge status: Home / Self Care | Payer: Self-pay | Source: Ambulatory Visit | Attending: Cardiovascular Disease | Admitting: Cardiovascular Disease

## 2017-11-16 ENCOUNTER — Encounter (HOSPITAL_COMMUNITY)
Admission: RE | Admit: 2017-11-16 | Discharge: 2017-11-16 | Disposition: A | Discharge status: Home / Self Care | Payer: Self-pay | Source: Ambulatory Visit | Attending: Cardiovascular Disease | Admitting: Cardiovascular Disease

## 2017-11-18 ENCOUNTER — Encounter (HOSPITAL_COMMUNITY)
Admission: RE | Admit: 2017-11-18 | Discharge: 2017-11-18 | Disposition: A | Discharge status: Home / Self Care | Payer: Self-pay | Source: Ambulatory Visit | Attending: Cardiovascular Disease | Admitting: Cardiovascular Disease

## 2017-11-22 ENCOUNTER — Other Ambulatory Visit: Payer: Self-pay | Admitting: Cardiovascular Disease

## 2017-11-22 DIAGNOSIS — E785 Hyperlipidemia, unspecified: Secondary | ICD-10-CM

## 2017-11-22 DIAGNOSIS — I10 Essential (primary) hypertension: Secondary | ICD-10-CM

## 2017-11-22 DIAGNOSIS — I25719 Atherosclerosis of autologous vein coronary artery bypass graft(s) with unspecified angina pectoris: Secondary | ICD-10-CM

## 2017-11-23 ENCOUNTER — Encounter (HOSPITAL_COMMUNITY)
Admission: RE | Admit: 2017-11-23 | Discharge: 2017-11-23 | Disposition: A | Discharge status: Home / Self Care | Payer: Medicare Other | Source: Ambulatory Visit | Attending: Cardiovascular Disease | Admitting: Cardiovascular Disease

## 2017-11-25 ENCOUNTER — Encounter (HOSPITAL_COMMUNITY)
Admission: RE | Admit: 2017-11-25 | Discharge: 2017-11-25 | Disposition: A | Discharge status: Home / Self Care | Payer: Medicare Other | Source: Ambulatory Visit | Attending: Cardiovascular Disease | Admitting: Cardiovascular Disease

## 2017-11-28 ENCOUNTER — Encounter (HOSPITAL_COMMUNITY)
Admission: RE | Admit: 2017-11-28 | Discharge: 2017-11-28 | Disposition: A | Discharge status: Home / Self Care | Payer: Self-pay | Source: Ambulatory Visit | Attending: Cardiovascular Disease | Admitting: Cardiovascular Disease

## 2017-11-28 DIAGNOSIS — I2510 Atherosclerotic heart disease of native coronary artery without angina pectoris: Secondary | ICD-10-CM | POA: Insufficient documentation

## 2017-11-30 ENCOUNTER — Encounter (HOSPITAL_COMMUNITY)
Admission: RE | Admit: 2017-11-30 | Discharge: 2017-11-30 | Disposition: A | Discharge status: Home / Self Care | Payer: Self-pay | Source: Ambulatory Visit | Attending: Cardiovascular Disease | Admitting: Cardiovascular Disease

## 2017-12-02 ENCOUNTER — Encounter (HOSPITAL_COMMUNITY)
Admission: RE | Admit: 2017-12-02 | Discharge: 2017-12-02 | Disposition: A | Discharge status: Home / Self Care | Payer: Self-pay | Source: Ambulatory Visit | Attending: Cardiovascular Disease | Admitting: Cardiovascular Disease

## 2017-12-02 ENCOUNTER — Ambulatory Visit: Payer: Medicare Other | Admitting: Cardiovascular Disease

## 2017-12-02 ENCOUNTER — Encounter: Payer: Self-pay | Admitting: Cardiovascular Disease

## 2017-12-02 VITALS — BP 120/62 | HR 87 | Ht 71.0 in | Wt 229.6 lb

## 2017-12-02 DIAGNOSIS — E782 Mixed hyperlipidemia: Secondary | ICD-10-CM

## 2017-12-02 DIAGNOSIS — I25119 Atherosclerotic heart disease of native coronary artery with unspecified angina pectoris: Secondary | ICD-10-CM

## 2017-12-02 NOTE — Patient Instructions (Signed)
Medication Instructions:  Your provider recommends that you continue on your current medications as directed. Please refer to the Current Medication list given to you today.    Labwork: None  Testing/Procedures: Your provider has requested that you have a lexiscan myoview in 6 months, prior to your visit with Dr. Excell Seltzer.. For further information please visit [email or web address] Please follow instruction sheet, as given.  Follow-Up: Your provider wants you to follow-up in: 6 months with Dr. Excell Seltzer. You will receive a reminder letter in the mail two months in advance. If you don't receive a letter, please call our office to schedule the follow-up appointment.    Any Other Special Instructions Will Be Listed Below (If Applicable).     If you need a refill on your cardiac medications before your next appointment, please call your pharmacy.

## 2017-12-02 NOTE — Progress Notes (Signed)
Cardiology Office Note Date:  12/02/2017   ID:  Jeff [name], DOB 04/21/1966, MRN [id number]  PCP:  Rodrigo Ran, MD  Cardiologist:  Tonny Bollman, MD    Chief Complaint  Patient presents with  . Follow-up    CAD     History of Present Illness: Jeff [name] is a 52 y.o. male who presents for follow-up of coronary artery disease.  Patient underwent multivessel CABG in 2011 complicated by early bypass graft failure.  He underwent multivessel stenting shortly after bypass surgery.  Most recent heart catheterization in 2014 demonstrated patency of his stent sites with a coronary disease pattern involving diffuse small vessel disease.  Medical therapy was recommended.  LV function has been normal.  The patient has been involved in the maintenance phase of cardiac rehab now for several years.  He's doing fine at cardiac rehab. Walking 2.3 mph on a 15 incline. Denies dyspnea or CP at that level. Some shortness of breath climbing stairs, but only intermittent, depending on his pace. No chest pain or pressure with exercise.  He does have occasions of "indigestion" and has a difficult time discerning cardiac versus gastrointestinal etiology.  He has had a left lower chest pain around the bottom of the rib cage with movements.  No orthopnea, PND, or heart palpitations.  Past Medical History:  Diagnosis Date  . Anxiety   . CAD (coronary artery disease)    Cath 2011.  LAD stent patent, distal LAD occlusion, D1 100%, OM branch 80 - 90%, SVG to diag patent, LIMA to LAD occluded, SVG to RCA occluded, SVG to OM was occluded.   No change from previous cath.  Managed medically.   . Cataract   . Depression   . Diabetes mellitus type I (HCC)    on insulin pump at  home  . Diabetic retinopathy    s/p vitrectomy and history of retinal surgery  . Hemorrhoid   . History of oxygen administration    nocutural use only at 2 l/m nasally.  [name] HTN (hypertension)   . Hyperlipidemia   . Myocardial  infarction (HCC)   . 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 Seasonal allergic rhinitis   . Seizure disorder (HCC)   . Sleep apnea    s/p oral surgery  . Toe fracture, right    11-23-14 fracture right big toe-wearing a stability shoe    Past Surgical History:  Procedure Laterality Date  . APPENDECTOMY    . CARDIAC CATHETERIZATION    . CATARACT EXTRACTION, BILATERAL    . COLONOSCOPY WITH PROPOFOL N/A 07/21/2017   Procedure: COLONOSCOPY WITH PROPOFOL;  Surgeon: [name], MD;  Location: [facility];  Service: Endoscopy;  Laterality: N/A;  . CORONARY ANGIOPLASTY     9'11,  CABPG grafts had failed.  . CORONARY ARTERY BYPASS GRAFT     4 vessel 4/11  . ESOPHAGOGASTRODUODENOSCOPY (EGD) WITH PROPOFOL N/A 12/05/2014   Procedure: ESOPHAGOGASTRODUODENOSCOPY (EGD) WITH PROPOFOL;  Surgeon: [name], MD;  Location: [facility];  Service: Endoscopy;  Laterality: N/A;  . LAPAROSCOPIC APPENDECTOMY  11/24/2011  . LEFT HEART CATHETERIZATION WITH CORONARY/GRAFT ANGIOGRAM N/A 02/22/2013   Procedure: LEFT HEART CATHETERIZATION WITH [name];  Surgeon: Tonny Bollman, MD;  Location: [facility] CATH LAB;  Service: Cardiovascular;  Laterality: N/A;  . release of right transverse carpal ligament    . right eye vitrectomy and detached retina repair  10/2007  . TONSILLECTOMY    .  uvulopalatoplasty surgery      Current Outpatient Medications  Medication Sig Dispense Refill  . acetaminophen (TYLENOL) 500 MG tablet Take 1,000 mg by mouth every 6 (six) hours as needed for mild pain.    [name] aspirin 81 MG tablet Take 81 mg by mouth daily.     . carbamazepine (TEGRETOL XR) 200 MG 12 hr tablet Take 3 tablets (600 mg total) by mouth 2 (two) times daily. Brand is medically necessary. 540 tablet 3  . Cholecalciferol (VITAMIN D) 2000 units CAPS Take 2,000 Units by mouth daily.     [name] desvenlafaxine (PRISTIQ) 100 MG 24 hr tablet Take 100 mg by mouth daily.    [name] docusate  sodium (COLACE) 100 MG capsule Take 100 mg by mouth at bedtime.     [name] ezetimibe (ZETIA) 10 MG tablet Take 10 mg by mouth at bedtime.     . insulin lispro (HUMALOG) 100 UNIT/ML injection Inject into the skin See admin instructions. Uses via Insulin Pump    . isosorbide mononitrate (IMDUR) 60 MG 24 hr tablet Take 60 mg by mouth every morning.     . levETIRAcetam (KEPPRA) 500 MG tablet Take 250 mg by mouth in the morning and take 500 mg by mouth at bedtime 135 tablet 3  . lisinopril (PRINIVIL,ZESTRIL) 5 MG tablet Take 5 mg by mouth every morning.     . metoprolol tartrate (LOPRESSOR) 25 MG tablet TAKE 1 TABLET(25 MG) BY MOUTH TWICE DAILY 180 tablet 1  . Multiple Vitamins-Minerals (CENTRUM SILVER 50+MEN) TABS Take 1 tablet by mouth every morning.    . nitroGLYCERIN (NITROSTAT) 0.4 MG SL tablet place 1 tablet under the tongue if needed every 5 minutes for chest pain (Patient taking differently: place 0.4 mg under the tongue if needed every 5 minutes for chest pain) 25 tablet 3  . OXYGEN Place 2 L into the nose at bedtime.    . rosuvastatin (CRESTOR) 40 MG tablet Take 40 mg by mouth at bedtime.     . vitamin B-12 (CYANOCOBALAMIN) 500 MCG tablet Take 500 mcg by mouth daily.    [name] omeprazole (PRILOSEC) 20 MG capsule Take 1 capsule (20 mg total) by mouth daily. 90 capsule 3   No current facility-administered medications for this visit.     Allergies:   Latex   Social History:  The patient  reports that he has never smoked. He has never used smokeless tobacco. He reports that he does not drink alcohol or use drugs.   Family History:  The patient's family history includes Arthritis in his sister; Coronary artery disease (age of onset: 36) in his father; Crohn's disease in his mother; Diabetes in his father; Hearing loss in his mother; Hypertension in his father; Squamous cell carcinoma in his brother.    ROS:  Please see the history of present illness. All other systems are reviewed and negative.     PHYSICAL EXAM: VS:  BP 120/62   Pulse 87   Ht 5\' 11"  (1.803 m)   Wt 229 lb 9.6 oz (104.1 kg)   SpO2 98%   BMI 32.02 kg/m  , BMI Body mass index is 32.02 kg/m. GEN: Well nourished, well developed, in no acute distress  HEENT: normal  Neck: no JVD, no masses. No carotid bruits Cardiac: RRR without murmur or gallop                Respiratory:  clear to auscultation bilaterally, normal work of breathing GI: soft, nontender, nondistended, + BS  MS: no deformity or atrophy  Ext: no pretibial edema, pedal pulses 2+= bilaterally Skin: warm and dry, no rash Neuro:  Strength and sensation are intact Psych: euthymic mood, full affect  EKG:  EKG is not ordered today.  Recent Labs: 10/31/2017: ALT 28; BUN 15; Creatinine, Ser 0.72; Hemoglobin 14.6; Platelets 178; Potassium 4.3; Sodium 140   Lipid Panel     Component Value Date/Time   CHOL 138 06/03/2011 0640   TRIG 111 06/03/2011 0640   HDL 42 06/03/2011 0640   CHOLHDL 3.3 06/03/2011 0640   VLDL 22 06/03/2011 0640   LDLCALC 74 06/03/2011 0640     Wt Readings from Last 3 Encounters:  12/02/17 229 lb 9.6 oz (104.1 kg)  10/31/17 227 lb 8 oz (103.2 kg)  08/04/17 234 lb (106.1 kg)    Cardiac Studies Reviewed: Echo 11-30-2016: Left ventricle: The cavity size was normal. There was mild concentric hypertrophy. Systolic function was vigorous. The estimated ejection fraction was in the range of 65% to 70%. Wall motion was normal; there were no regional wall motion abnormalities. The transmitral flow pattern was normal. The deceleration time of the early transmitral flow velocity was normal. The ratio of systolic to diastolic pulmonary vein flow was within the normal range (systolic predominant). Early diastolic septal annular tissue Doppler velocities Ea were abnormal. Doppler parameters are consistent with abnormal left ventricular relaxation (grade 1 diastolic dysfunction). Indeterminate ventricular filling pressure by Doppler  parameters.  ------------------------------------------------------------------- Aortic valve: Trileaflet; normal thickness leaflets. Mobility was not restricted. Doppler: Transvalvular velocity was within the normal range. There was no stenosis. There was no regurgitation.  ------------------------------------------------------------------- Aorta: Aortic root: The aortic root was normal in size.  ------------------------------------------------------------------- Mitral valve: Calcified annulus. Mobility was not restricted. Doppler: Transvalvular velocity was within the normal range. There was no evidence for stenosis. There was no regurgitation. Peak gradient (D): 3 mm Hg.  ------------------------------------------------------------------- Left atrium: The atrium was mildly dilated.  ------------------------------------------------------------------- Right ventricle: The cavity size was normal. Wall thickness was normal. Systolic function was normal.  ------------------------------------------------------------------- Ventricular septum: Septal motion showed paradox. These changes are consistent with a post-thoracotomy state.  ------------------------------------------------------------------- Pulmonic valve: The valve appears to be grossly normal. Doppler: Transvalvular velocity was within the normal range. There was no evidence for stenosis. There was trivial regurgitation.  ------------------------------------------------------------------- Tricuspid valve: Structurally normal valve. Doppler: Transvalvular velocity was within the normal range. There was mild regurgitation.  ------------------------------------------------------------------- Pulmonary artery: The main pulmonary artery was normal-sized. Systolic pressure was within the normal range.  ------------------------------------------------------------------- Right atrium: The  atrium was normal in size.  ------------------------------------------------------------------- Pericardium: There was no pericardial effusion.  Eugenie Birks Myoview Stress Test 06/17/2015: Study Highlights    Nuclear stress EF: 69%.  There was no ST segment deviation noted during stress.  The study is normal.  This is a low risk study.    ASSESSMENT AND PLAN: 1.  Coronary artery disease, native vessel, with angina: Appears stable.  No change in functional capacity based on his description of symptoms and regular attendance at cardiac rehab.  His medical program is reviewed and will be continued without changes.  He is on isosorbide and metoprolol.  He continues on aspirin for antiplatelet therapy.  With his diabetes and diffuse CAD, I have recommended a Lexiscan Myoview stress test prior to his next visit in 6 months for a 3-year interval study.  2.  Hyperlipidemia: Treated with a combination of Crestor and ezetimibe.  Followed by Dr. Waynard Edwards.  Last labs reviewed.  3.  Diabetes: Last hemoglobin A1c 7.2.  Patient on insulin pump.  Diet/lifestyle modification discussed today.  Current medicines are reviewed with the patient today.  The patient does not have concerns regarding medicines.  Labs/ tests ordered today include:   Orders Placed This Encounter  Procedures  . MYOCARDIAL PERFUSION IMAGING    Disposition:   FU 6 months  Signed, Tonny Bollman, MD  12/02/2017 11:35 AM    [facility] [address] [place], [place], [place]  [address] Phone: [phone]; Fax: [phone]

## 2017-12-05 ENCOUNTER — Encounter (HOSPITAL_COMMUNITY)
Admission: RE | Admit: 2017-12-05 | Discharge: 2017-12-05 | Disposition: A | Discharge status: Home / Self Care | Payer: Self-pay | Source: Ambulatory Visit | Attending: Cardiovascular Disease | Admitting: Cardiovascular Disease

## 2017-12-07 ENCOUNTER — Encounter (HOSPITAL_COMMUNITY)
Admission: RE | Admit: 2017-12-07 | Discharge: 2017-12-07 | Disposition: A | Discharge status: Home / Self Care | Payer: Self-pay | Source: Ambulatory Visit | Attending: Cardiovascular Disease | Admitting: Cardiovascular Disease

## 2017-12-09 ENCOUNTER — Encounter (HOSPITAL_COMMUNITY)
Admission: RE | Admit: 2017-12-09 | Discharge: 2017-12-09 | Disposition: A | Discharge status: Home / Self Care | Payer: Self-pay | Source: Ambulatory Visit | Attending: Cardiovascular Disease | Admitting: Cardiovascular Disease

## 2017-12-12 ENCOUNTER — Encounter (HOSPITAL_COMMUNITY): Payer: Self-pay

## 2017-12-14 ENCOUNTER — Encounter (HOSPITAL_COMMUNITY)
Admission: RE | Admit: 2017-12-14 | Discharge: 2017-12-14 | Disposition: A | Discharge status: Home / Self Care | Payer: Self-pay | Source: Ambulatory Visit | Attending: Cardiovascular Disease | Admitting: Cardiovascular Disease

## 2017-12-16 ENCOUNTER — Encounter (HOSPITAL_COMMUNITY)
Admission: RE | Admit: 2017-12-16 | Discharge: 2017-12-16 | Disposition: A | Discharge status: Home / Self Care | Payer: Self-pay | Source: Ambulatory Visit | Attending: Cardiovascular Disease | Admitting: Cardiovascular Disease

## 2017-12-19 ENCOUNTER — Encounter (HOSPITAL_COMMUNITY)
Admission: RE | Admit: 2017-12-19 | Discharge: 2017-12-19 | Disposition: A | Discharge status: Home / Self Care | Payer: Medicare Other | Source: Ambulatory Visit | Attending: Cardiovascular Disease | Admitting: Cardiovascular Disease

## 2017-12-21 ENCOUNTER — Encounter (HOSPITAL_COMMUNITY)
Admission: RE | Admit: 2017-12-21 | Discharge: 2017-12-21 | Disposition: A | Discharge status: Home / Self Care | Payer: Medicare Other | Source: Ambulatory Visit | Attending: Cardiovascular Disease | Admitting: Cardiovascular Disease

## 2017-12-23 ENCOUNTER — Encounter (HOSPITAL_COMMUNITY)
Admission: RE | Admit: 2017-12-23 | Discharge: 2017-12-23 | Disposition: A | Discharge status: Home / Self Care | Payer: Medicare Other | Source: Ambulatory Visit | Attending: Cardiovascular Disease | Admitting: Cardiovascular Disease

## 2017-12-26 ENCOUNTER — Encounter (HOSPITAL_COMMUNITY): Payer: Self-pay

## 2017-12-26 DIAGNOSIS — I2510 Atherosclerotic heart disease of native coronary artery without angina pectoris: Secondary | ICD-10-CM | POA: Insufficient documentation

## 2017-12-28 ENCOUNTER — Encounter (HOSPITAL_COMMUNITY)
Admission: RE | Admit: 2017-12-28 | Discharge: 2017-12-28 | Disposition: A | Discharge status: Home / Self Care | Payer: Self-pay | Source: Ambulatory Visit | Attending: Cardiovascular Disease | Admitting: Cardiovascular Disease

## 2017-12-30 ENCOUNTER — Encounter (HOSPITAL_COMMUNITY)
Admission: RE | Admit: 2017-12-30 | Discharge: 2017-12-30 | Disposition: A | Discharge status: Home / Self Care | Payer: Self-pay | Source: Ambulatory Visit | Attending: Cardiovascular Disease | Admitting: Cardiovascular Disease

## 2018-01-02 ENCOUNTER — Encounter (HOSPITAL_COMMUNITY)
Admission: RE | Admit: 2018-01-02 | Discharge: 2018-01-02 | Disposition: A | Discharge status: Home / Self Care | Payer: Self-pay | Source: Ambulatory Visit | Attending: Cardiovascular Disease | Admitting: Cardiovascular Disease

## 2018-01-04 ENCOUNTER — Encounter (HOSPITAL_COMMUNITY): Payer: Self-pay

## 2018-01-06 ENCOUNTER — Encounter (HOSPITAL_COMMUNITY)
Admission: RE | Admit: 2018-01-06 | Discharge: 2018-01-06 | Disposition: A | Discharge status: Home / Self Care | Payer: Self-pay | Source: Ambulatory Visit | Attending: Cardiovascular Disease | Admitting: Cardiovascular Disease

## 2018-01-09 ENCOUNTER — Encounter (HOSPITAL_COMMUNITY)
Admission: RE | Admit: 2018-01-09 | Discharge: 2018-01-09 | Disposition: A | Discharge status: Home / Self Care | Payer: Medicare Other | Source: Ambulatory Visit | Attending: Cardiovascular Disease | Admitting: Cardiovascular Disease

## 2018-01-11 ENCOUNTER — Encounter (HOSPITAL_COMMUNITY)
Admission: RE | Admit: 2018-01-11 | Discharge: 2018-01-11 | Disposition: A | Discharge status: Home / Self Care | Payer: Self-pay | Source: Ambulatory Visit | Attending: Cardiovascular Disease | Admitting: Cardiovascular Disease

## 2018-01-13 ENCOUNTER — Encounter (HOSPITAL_COMMUNITY)
Admission: RE | Admit: 2018-01-13 | Discharge: 2018-01-13 | Disposition: A | Discharge status: Home / Self Care | Payer: Self-pay | Source: Ambulatory Visit | Attending: Cardiovascular Disease | Admitting: Cardiovascular Disease

## 2018-01-16 ENCOUNTER — Encounter (HOSPITAL_COMMUNITY)
Admission: RE | Admit: 2018-01-16 | Discharge: 2018-01-16 | Disposition: A | Discharge status: Home / Self Care | Payer: Medicare Other | Source: Ambulatory Visit | Attending: Cardiovascular Disease | Admitting: Cardiovascular Disease

## 2018-01-18 ENCOUNTER — Encounter (HOSPITAL_COMMUNITY)
Admission: RE | Admit: 2018-01-18 | Discharge: 2018-01-18 | Disposition: A | Discharge status: Home / Self Care | Payer: Self-pay | Source: Ambulatory Visit | Attending: Cardiovascular Disease | Admitting: Cardiovascular Disease

## 2018-01-20 ENCOUNTER — Encounter (HOSPITAL_COMMUNITY)
Admission: RE | Admit: 2018-01-20 | Discharge: 2018-01-20 | Disposition: A | Discharge status: Home / Self Care | Payer: Self-pay | Source: Ambulatory Visit | Attending: Cardiovascular Disease | Admitting: Cardiovascular Disease

## 2018-01-23 ENCOUNTER — Encounter (HOSPITAL_COMMUNITY)
Admission: RE | Admit: 2018-01-23 | Discharge: 2018-01-23 | Disposition: A | Discharge status: Home / Self Care | Payer: Self-pay | Source: Ambulatory Visit | Attending: Cardiovascular Disease | Admitting: Cardiovascular Disease

## 2018-01-25 ENCOUNTER — Encounter (HOSPITAL_COMMUNITY)
Admission: RE | Admit: 2018-01-25 | Discharge: 2018-01-25 | Disposition: A | Discharge status: Home / Self Care | Payer: Self-pay | Source: Ambulatory Visit | Attending: Cardiovascular Disease | Admitting: Cardiovascular Disease

## 2018-01-27 ENCOUNTER — Encounter (HOSPITAL_COMMUNITY)
Admission: RE | Admit: 2018-01-27 | Discharge: 2018-01-27 | Disposition: A | Discharge status: Home / Self Care | Payer: Self-pay | Source: Ambulatory Visit | Attending: Cardiovascular Disease | Admitting: Cardiovascular Disease

## 2018-01-27 DIAGNOSIS — I2510 Atherosclerotic heart disease of native coronary artery without angina pectoris: Secondary | ICD-10-CM | POA: Insufficient documentation

## 2018-01-30 ENCOUNTER — Encounter (HOSPITAL_COMMUNITY)
Admission: RE | Admit: 2018-01-30 | Discharge: 2018-01-30 | Disposition: A | Discharge status: Home / Self Care | Payer: Medicare Other | Source: Ambulatory Visit | Attending: Cardiovascular Disease | Admitting: Cardiovascular Disease

## 2018-02-01 ENCOUNTER — Encounter (HOSPITAL_COMMUNITY): Payer: Self-pay

## 2018-02-03 ENCOUNTER — Encounter (HOSPITAL_COMMUNITY)
Admission: RE | Admit: 2018-02-03 | Discharge: 2018-02-03 | Disposition: A | Discharge status: Home / Self Care | Payer: Self-pay | Source: Ambulatory Visit | Attending: Cardiovascular Disease | Admitting: Cardiovascular Disease

## 2018-02-06 ENCOUNTER — Encounter (HOSPITAL_COMMUNITY): Payer: Self-pay

## 2018-02-08 ENCOUNTER — Encounter (HOSPITAL_COMMUNITY)
Admission: RE | Admit: 2018-02-08 | Discharge: 2018-02-08 | Disposition: A | Discharge status: Home / Self Care | Payer: Self-pay | Source: Ambulatory Visit | Attending: Cardiovascular Disease | Admitting: Cardiovascular Disease

## 2018-02-10 ENCOUNTER — Encounter (HOSPITAL_COMMUNITY)
Admission: RE | Admit: 2018-02-10 | Discharge: 2018-02-10 | Disposition: A | Discharge status: Home / Self Care | Payer: Self-pay | Source: Ambulatory Visit | Attending: Cardiovascular Disease | Admitting: Cardiovascular Disease

## 2018-02-13 ENCOUNTER — Encounter (HOSPITAL_COMMUNITY)
Admission: RE | Admit: 2018-02-13 | Discharge: 2018-02-13 | Disposition: A | Discharge status: Home / Self Care | Payer: Self-pay | Source: Ambulatory Visit | Attending: Cardiovascular Disease | Admitting: Cardiovascular Disease

## 2018-02-15 ENCOUNTER — Encounter (HOSPITAL_COMMUNITY)
Admission: RE | Admit: 2018-02-15 | Discharge: 2018-02-15 | Disposition: A | Discharge status: Home / Self Care | Payer: Self-pay | Source: Ambulatory Visit | Attending: Cardiovascular Disease | Admitting: Cardiovascular Disease

## 2018-02-17 ENCOUNTER — Encounter (HOSPITAL_COMMUNITY): Payer: Self-pay

## 2018-02-20 ENCOUNTER — Encounter (HOSPITAL_COMMUNITY)
Admission: RE | Admit: 2018-02-20 | Discharge: 2018-02-20 | Disposition: A | Discharge status: Home / Self Care | Payer: Medicare Other | Source: Ambulatory Visit | Attending: Cardiovascular Disease | Admitting: Cardiovascular Disease

## 2018-02-22 ENCOUNTER — Encounter (HOSPITAL_COMMUNITY)
Admission: RE | Admit: 2018-02-22 | Discharge: 2018-02-22 | Disposition: A | Discharge status: Home / Self Care | Payer: Medicare Other | Source: Ambulatory Visit | Attending: Cardiovascular Disease | Admitting: Cardiovascular Disease

## 2018-02-24 ENCOUNTER — Encounter (HOSPITAL_COMMUNITY)
Admission: RE | Admit: 2018-02-24 | Discharge: 2018-02-24 | Disposition: A | Discharge status: Home / Self Care | Payer: Medicare Other | Source: Ambulatory Visit | Attending: Cardiovascular Disease | Admitting: Cardiovascular Disease

## 2018-03-01 ENCOUNTER — Ambulatory Visit: Payer: Medicare Other | Admitting: Cardiovascular Disease

## 2018-03-01 ENCOUNTER — Encounter

## 2018-03-01 ENCOUNTER — Encounter (HOSPITAL_COMMUNITY)
Admission: RE | Admit: 2018-03-01 | Discharge: 2018-03-01 | Disposition: A | Discharge status: Home / Self Care | Payer: Self-pay | Source: Ambulatory Visit | Attending: Cardiovascular Disease | Admitting: Cardiovascular Disease

## 2018-03-01 DIAGNOSIS — I2510 Atherosclerotic heart disease of native coronary artery without angina pectoris: Secondary | ICD-10-CM | POA: Insufficient documentation

## 2018-03-03 ENCOUNTER — Encounter (HOSPITAL_COMMUNITY)
Admission: RE | Admit: 2018-03-03 | Discharge: 2018-03-03 | Disposition: A | Discharge status: Home / Self Care | Payer: Self-pay | Source: Ambulatory Visit | Attending: Cardiovascular Disease | Admitting: Cardiovascular Disease

## 2018-03-06 ENCOUNTER — Encounter (HOSPITAL_COMMUNITY)
Admission: RE | Admit: 2018-03-06 | Discharge: 2018-03-06 | Disposition: A | Discharge status: Home / Self Care | Payer: Self-pay | Source: Ambulatory Visit | Attending: Cardiovascular Disease | Admitting: Cardiovascular Disease

## 2018-03-06 ENCOUNTER — Telehealth: Payer: Self-pay | Admitting: Cardiovascular Disease

## 2018-03-06 NOTE — Telephone Encounter (Signed)
New Message    Pt states he wants to speak with the nurse but does not want to disclose reason. Please call

## 2018-03-06 NOTE — Telephone Encounter (Signed)
   [facility] Pre-operative Risk Assessment    Request for surgical clearance:  1. What type of surgery is being performed? Left inguinal hernia repair   2. When is this surgery scheduled? TBD   3. What type of clearance is required (medical clearance vs. Pharmacy clearance to hold med vs. Both)? Both  4. Are there any medications that need to be held prior to surgery and how long? ASA   5. Practice name and name of physician performing surgery? [facility]. Gurney Maxin, MD   6. What is your office phone number [phone]    7.   What is your office fax number [phone]  8.   Anesthesia type (None, local, MAC, general) ? General   Jeff [name] 03/06/2018, 3:47 PM  _________________________________________________________________   (provider comments below)

## 2018-03-07 NOTE — Telephone Encounter (Signed)
   Primary Cardiologist: Tonny Bollman, MD  Chart reviewed and patient contacted today by phone as part of pre-operative protocol coverage. Given past medical history and time since last visit, based on ACC/AHA guidelines, Jeff [name] would be at acceptable risk for the planned procedure without further cardiovascular testing.   OK to hold aspirin if needed pre op.  I will route this recommendation to the requesting party via Epic fax function and remove from pre-op pool.  Please call with questions.  Corine Shelter, PA-C 03/07/2018, 3:21 PM

## 2018-03-08 ENCOUNTER — Encounter (HOSPITAL_COMMUNITY)
Admission: RE | Admit: 2018-03-08 | Discharge: 2018-03-08 | Disposition: A | Discharge status: Home / Self Care | Payer: Self-pay | Source: Ambulatory Visit | Attending: Cardiovascular Disease | Admitting: Cardiovascular Disease

## 2018-03-09 ENCOUNTER — Encounter (HOSPITAL_COMMUNITY): Payer: Self-pay

## 2018-03-09 ENCOUNTER — Emergency Department (HOSPITAL_COMMUNITY): Payer: Medicare Other

## 2018-03-09 ENCOUNTER — Emergency Department (HOSPITAL_COMMUNITY)
Admission: EM | Admit: 2018-03-09 | Discharge: 2018-03-09 | Disposition: A | Discharge status: Home / Self Care | Payer: Medicare Other | Attending: Emergency Medicine | Admitting: Emergency Medicine

## 2018-03-09 DIAGNOSIS — Z7982 Long term (current) use of aspirin: Secondary | ICD-10-CM | POA: Diagnosis not present

## 2018-03-09 DIAGNOSIS — Z79899 Other long term (current) drug therapy: Secondary | ICD-10-CM | POA: Diagnosis not present

## 2018-03-09 DIAGNOSIS — I2510 Atherosclerotic heart disease of native coronary artery without angina pectoris: Secondary | ICD-10-CM | POA: Diagnosis not present

## 2018-03-09 DIAGNOSIS — E109 Type 1 diabetes mellitus without complications: Secondary | ICD-10-CM | POA: Diagnosis not present

## 2018-03-09 DIAGNOSIS — I10 Essential (primary) hypertension: Secondary | ICD-10-CM | POA: Insufficient documentation

## 2018-03-09 DIAGNOSIS — Z041 Encounter for examination and observation following transport accident: Secondary | ICD-10-CM | POA: Diagnosis not present

## 2018-03-09 LAB — CBC WITH DIFFERENTIAL/PLATELET
Abs Immature Granulocytes: 0 10*3/uL (ref 0.0–0.1)
BASOS PCT: 1 %
Basophils Absolute: 0 10*3/uL (ref 0.0–0.1)
EOS PCT: 1 %
Eosinophils Absolute: 0.1 10*3/uL (ref 0.0–0.7)
HEMATOCRIT: 45.4 % (ref 39.0–52.0)
Hemoglobin: 15.3 g/dL (ref 13.0–17.0)
Immature Granulocytes: 1 %
LYMPHS ABS: 0.9 10*3/uL (ref 0.7–4.0)
Lymphocytes Relative: 14 %
MCH: 31 pg (ref 26.0–34.0)
MCHC: 33.7 g/dL (ref 30.0–36.0)
MCV: 92.1 fL (ref 78.0–100.0)
MONO ABS: 0.6 10*3/uL (ref 0.1–1.0)
MONOS PCT: 10 %
Neutro Abs: 4.8 10*3/uL (ref 1.7–7.7)
Neutrophils Relative %: 73 %
PLATELETS: 182 10*3/uL (ref 150–400)
RBC: 4.93 MIL/uL (ref 4.22–5.81)
RDW: 12.5 % (ref 11.5–15.5)
WBC: 6.5 10*3/uL (ref 4.0–10.5)

## 2018-03-09 LAB — BASIC METABOLIC PANEL
Anion gap: 8 (ref 5–15)
BUN: 11 mg/dL (ref 6–20)
CALCIUM: 8.9 mg/dL (ref 8.9–10.3)
CO2: 26 mmol/L (ref 22–32)
CREATININE: 0.77 mg/dL (ref 0.61–1.24)
Chloride: 104 mmol/L (ref 98–111)
GFR calc non Af Amer: >60 mL/min (ref >60)
GLUCOSE: 154 mg/dL — AB (ref 70–99)
Potassium: 4.5 mmol/L (ref 3.5–5.1)
Sodium: 138 mmol/L (ref 135–145)

## 2018-03-09 LAB — CBG MONITORING, ED: GLUCOSE-CAPILLARY: 148 mg/dL — AB (ref 70–99)

## 2018-03-09 LAB — CARBAMAZEPINE LEVEL, TOTAL: Carbamazepine Lvl: 9 ug/mL (ref 4.0–12.0)
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
## 2018-03-09 NOTE — ED Notes (Signed)
Pt's CBG result was 148. Informed Magda Paganini - RN.
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
## 2018-03-09 NOTE — Discharge Instructions (Addendum)
Don't drive until you see your neurologist again.  Apply bacitracin to the wound on your hand two times daily. Wash it with soap and water.  You may take over the counter antiinflammatories to treat your pain. You may use cold or warm compresses to help improve your symptoms.   Please follow-up with your neurologist and your primary care doctor within 1 week for reevaluation and return to the operating worsening symptoms in the meantime.
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
## 2018-03-12 LAB — LEVETIRACETAM LEVEL: LEVETIRACETAM: 5.1 ug/mL — AB (ref 10.0–40.0)

## 2018-03-14 ENCOUNTER — Telehealth: Payer: Self-pay | Admitting: Neurology

## 2018-03-14 NOTE — Telephone Encounter (Signed)
I called Jennifer back. Pt not at their office. He called and requested appt and they advised him to f/u with Dr. Anne Hahn. Pt requested they call to get appt for him to see Dr. Anne Hahn. Advised I will call pt to get this set up. She verbalized understanding.   I called pt and offered appt tomorrow at 8am with Dr. Anne Hahn. He is going to call and make sure he has a ride. He will call back to let us know for sure.  Recent keppra level 03/09/18 5.1. He denies missing any medication doses.   He takes generic keppra. He wants to know why this level was not checked in previous labs. He will speak with Dr. Anne Hahn at appt about this tomorrow.

## 2018-03-14 NOTE — Telephone Encounter (Addendum)
Victorino Dike with [facility] calling for the patient. He has called her to call us to see if he can be seen earlier by Dr. Anne Hahn. His appointment is 06-26-18. She says patient says his Keppra level is low and other seizure medication is low. Please call patient. He can be reached at [phone].

## 2018-03-14 NOTE — Telephone Encounter (Signed)
Dr. Willis- FYI 

## 2018-03-14 NOTE — Telephone Encounter (Signed)
Pt called back accepting the appt for tomorrow 9/18 at 8 aware to check in at 7:30 [facility]

## 2018-03-15 ENCOUNTER — Encounter: Payer: Self-pay | Admitting: Neurology

## 2018-03-15 ENCOUNTER — Ambulatory Visit: Payer: Medicare Other | Admitting: Neurology

## 2018-03-15 ENCOUNTER — Encounter

## 2018-03-15 ENCOUNTER — Encounter (HOSPITAL_COMMUNITY): Payer: Self-pay

## 2018-03-15 ENCOUNTER — Telehealth: Payer: Self-pay | Admitting: Neurology

## 2018-03-15 VITALS — BP 151/70 | HR 90 | Ht 71.0 in | Wt 224.5 lb

## 2018-03-15 DIAGNOSIS — Q8501 Neurofibromatosis, type 1: Secondary | ICD-10-CM | POA: Diagnosis not present

## 2018-03-15 DIAGNOSIS — G40209 Localization-related (focal) (partial) symptomatic epilepsy and epileptic syndromes with complex partial seizures, not intractable, without status epilepticus: Secondary | ICD-10-CM | POA: Diagnosis not present

## 2018-03-15 MED ORDER — LEVETIRACETAM 750 MG PO TABS: 750.0000 mg | ORAL_TABLET | Freq: Two times a day (BID) | ORAL | 3 refills | Status: DC
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
## 2018-03-15 NOTE — Patient Instructions (Signed)
We will go to 500 mg twice a day on the Keppra, then take 750 mg twice a day.

## 2018-03-15 NOTE — Telephone Encounter (Signed)
[employer] Medicare order sent to GI. No auth they will reach out to the pt to schedule.  °

## 2018-03-17 ENCOUNTER — Encounter (HOSPITAL_COMMUNITY)
Admission: RE | Admit: 2018-03-17 | Discharge: 2018-03-17 | Disposition: A | Discharge status: Home / Self Care | Payer: Self-pay | Source: Ambulatory Visit | Attending: Cardiovascular Disease | Admitting: Cardiovascular Disease

## 2018-03-20 ENCOUNTER — Other Ambulatory Visit: Payer: Self-pay | Admitting: General Surgery

## 2018-03-20 ENCOUNTER — Encounter (HOSPITAL_COMMUNITY): Payer: Self-pay

## 2018-03-22 ENCOUNTER — Encounter (HOSPITAL_COMMUNITY)
Admission: RE | Admit: 2018-03-22 | Discharge: 2018-03-22 | Disposition: A | Discharge status: Home / Self Care | Payer: Self-pay | Source: Ambulatory Visit | Attending: Cardiovascular Disease | Admitting: Cardiovascular Disease

## 2018-03-23 ENCOUNTER — Telehealth: Payer: Self-pay | Admitting: Cardiovascular Disease

## 2018-03-23 NOTE — Telephone Encounter (Signed)
New Message   [place] from [facility] is calling beause Dr.Kinsinger is calling to speak with Dr. Excell Seltzer about the pt and states he will wait until he gets in tomorrow

## 2018-03-23 NOTE — Telephone Encounter (Signed)
Dr Kinsinger's RN is calling to get in touch with Dr Excell Seltzer regarding pt's surgical clearance. Advised RN I would send a message to Dr [employer] RN as he is out of the office until the end of next week.

## 2018-03-24 ENCOUNTER — Encounter (HOSPITAL_COMMUNITY): Payer: Self-pay

## 2018-03-24 ENCOUNTER — Telehealth: Payer: Self-pay | Admitting: Cardiovascular Disease

## 2018-03-24 NOTE — Telephone Encounter (Signed)
Discussed myoview in great detail with Jeff [name]. Reiterated to him that Dr. Excell Seltzer cleared him for surgery without having a stress test - it was ordered for monitoring prior to next OV. Jeff [name] stated Dr. Sheliah Hatch spoke directly with Dr. Excell Seltzer and disagreed with him. He reports Dr. Sheliah Hatch ordered a stress test and sent the order over to [facility].  He then reports a woman named Jeff [name] from [facility] called this afternoon and left a message on his VM to call and schedule a stress test per Dr. Sheliah Hatch. He states when he called back, the scheduler just tried to give him an appointment with an APP (and not a stress test) so he asked for Nursing.  The patient does not know why Dr. Sheliah Hatch disagreed with Dr. Excell Seltzer, but will not do hernia surgery without the stress test he ordered.  Informed the patient Dr. Sheliah Hatch must have sent a paper order for a stress test [facility] is not in Epic) and Jeff [name] will be asked about it, but she is not here now.   Jeff [name] requests Jeff [name] call him on Monday to arrange stress test per Dr. Sheliah Hatch.

## 2018-03-24 NOTE — Telephone Encounter (Signed)
New Message    Patient is calling in reference to what service he has that needs to be done. We received a referral and the referral says "pre op stratificatin and possible up myoview test". But then there is a order in the system for a stress test but that's not until December. As well as there Is phone note that states "the patient's Celine Ahr was ordered for CAD monitoring to be done prior to OV in December, not prior to surgery. Reiterated to her that the patient has already been cleared for surgery'. But the patient is demanding the stress test. Please call patient to advise.

## 2018-03-24 NOTE — Telephone Encounter (Signed)
Informed nurse at Dr. Guerry Minors office that per Dr. Excell Seltzer, the patient's Jeff [name] was ordered for CAD monitoring to be done prior to OV in December, not prior to surgery. Reiterated to her that the patient has already been cleared for surgery. She was grateful for call.

## 2018-03-26 ENCOUNTER — Ambulatory Visit
Admission: RE | Admit: 2018-03-26 | Discharge: 2018-03-26 | Disposition: A | Discharge status: Home / Self Care | Payer: Medicare Other | Source: Ambulatory Visit | Attending: Neurology | Admitting: Neurology

## 2018-03-26 DIAGNOSIS — Q8501 Neurofibromatosis, type 1: Secondary | ICD-10-CM | POA: Diagnosis not present

## 2018-03-26 DIAGNOSIS — G40209 Localization-related (focal) (partial) symptomatic epilepsy and epileptic syndromes with complex partial seizures, not intractable, without status epilepticus: Secondary | ICD-10-CM

## 2018-03-26 MED ORDER — GADOBENATE DIMEGLUMINE 529 MG/ML IV SOLN
20.0000 mL | Freq: Once | INTRAVENOUS | Status: AC | PRN
  Administered 2018-03-26: 20 mL via INTRAVENOUS

## 2018-03-27 ENCOUNTER — Telehealth: Payer: Self-pay | Admitting: Neurology

## 2018-03-27 ENCOUNTER — Encounter (HOSPITAL_COMMUNITY): Payer: Self-pay

## 2018-03-27 NOTE — Telephone Encounter (Signed)
  I called the patient.  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  MRI brain 03/27/18:  IMPRESSION: This is an abnormal MRI of the brain with and without contrast showing the following: 1.     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 2.     Plexiform neurofibroma of the left scalp. 3.     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     Dural ectasia of the optic nerves, right greater than left. 5.     The changes of neurofibromatosis are stable compared to the MRI from 2014. 6.     Mild generalized cortical atrophy.    The structures of the mesial temporal lobes appear normal. 7.     Mild ethmoid chronic sinusitis. 8.     There are no acute findings.

## 2018-03-28 ENCOUNTER — Encounter (HOSPITAL_BASED_OUTPATIENT_CLINIC_OR_DEPARTMENT_OTHER): Payer: No Typology Code available for payment source | Attending: Internal Medicine

## 2018-03-29 ENCOUNTER — Encounter (HOSPITAL_COMMUNITY)
Admission: RE | Admit: 2018-03-29 | Discharge: 2018-03-29 | Disposition: A | Discharge status: Home / Self Care | Payer: Self-pay | Source: Ambulatory Visit | Attending: Cardiovascular Disease | Admitting: Cardiovascular Disease

## 2018-03-29 DIAGNOSIS — I2510 Atherosclerotic heart disease of native coronary artery without angina pectoris: Secondary | ICD-10-CM | POA: Insufficient documentation

## 2018-03-31 ENCOUNTER — Encounter (HOSPITAL_COMMUNITY)
Admission: RE | Admit: 2018-03-31 | Discharge: 2018-03-31 | Disposition: A | Discharge status: Home / Self Care | Payer: Self-pay | Source: Ambulatory Visit | Attending: Cardiovascular Disease | Admitting: Cardiovascular Disease

## 2018-04-03 ENCOUNTER — Encounter (HOSPITAL_COMMUNITY): Payer: Self-pay

## 2018-04-05 ENCOUNTER — Encounter (HOSPITAL_COMMUNITY)
Admission: RE | Admit: 2018-04-05 | Discharge: 2018-04-05 | Disposition: A | Discharge status: Home / Self Care | Payer: Self-pay | Source: Ambulatory Visit | Attending: Cardiovascular Disease | Admitting: Cardiovascular Disease

## 2018-04-06 ENCOUNTER — Telehealth (HOSPITAL_COMMUNITY): Payer: Self-pay | Admitting: *Deleted

## 2018-04-06 NOTE — Telephone Encounter (Signed)
Patient given detailed instructions per Myocardial Perfusion Study Information Sheet for the test on 04/11/18. Patient notified to arrive 15 minutes early and that it is imperative to arrive on time for appointment to keep from having the test rescheduled.  If you need to cancel or reschedule your appointment, please call the office within 24 hours of your appointment. . Patient verbalized understanding. Jeff [name]     

## 2018-04-07 ENCOUNTER — Encounter (HOSPITAL_COMMUNITY): Payer: Self-pay

## 2018-04-10 ENCOUNTER — Encounter (HOSPITAL_COMMUNITY)
Admission: RE | Admit: 2018-04-10 | Discharge: 2018-04-10 | Disposition: A | Discharge status: Home / Self Care | Payer: Medicare Other | Source: Ambulatory Visit | Attending: Cardiovascular Disease | Admitting: Cardiovascular Disease

## 2018-04-11 ENCOUNTER — Ambulatory Visit (HOSPITAL_COMMUNITY): Payer: Medicare Other | Attending: Cardiology

## 2018-04-11 DIAGNOSIS — I25119 Atherosclerotic heart disease of native coronary artery with unspecified angina pectoris: Secondary | ICD-10-CM | POA: Insufficient documentation

## 2018-04-11 LAB — MYOCARDIAL PERFUSION IMAGING
CHL CUP RESTING HR STRESS: 77 {beats}/min
LV sys vol: 17 mL
LVDIAVOL: 56 mL (ref 62–150)
NUC STRESS TID: 1.11
Peak HR: 105 {beats}/min
SDS: 2
SRS: 1
SSS: 3

## 2018-04-11 MED ORDER — TECHNETIUM TC 99M TETROFOSMIN IV KIT
30.4000 | PACK | Freq: Once | INTRAVENOUS | Status: AC | PRN
  Administered 2018-04-11: 30.4 via INTRAVENOUS
  Filled 2018-04-11: qty 31

## 2018-04-11 MED ORDER — TECHNETIUM TC 99M TETROFOSMIN IV KIT
10.3000 | PACK | Freq: Once | INTRAVENOUS | Status: AC | PRN
  Administered 2018-04-11: 10.3 via INTRAVENOUS
  Filled 2018-04-11: qty 11

## 2018-04-11 MED ORDER — ADENOSINE (DIAGNOSTIC) 3 MG/ML IV SOLN
0.5600 mg/kg | Freq: Once | INTRAVENOUS | Status: AC
  Administered 2018-04-11: 58.2 mg via INTRAVENOUS

## 2018-04-12 ENCOUNTER — Encounter (HOSPITAL_COMMUNITY): Payer: Self-pay

## 2018-04-13 NOTE — Telephone Encounter (Signed)
Follow Up: ° ° ° °Returning your call from this morning. °

## 2018-04-13 NOTE — Telephone Encounter (Signed)
See result note. Informed patient of results and verbal understanding expressed.  6 mo OV scheduled.

## 2018-04-14 ENCOUNTER — Encounter (HOSPITAL_COMMUNITY): Payer: Self-pay

## 2018-04-17 ENCOUNTER — Encounter (HOSPITAL_COMMUNITY)
Admission: RE | Admit: 2018-04-17 | Discharge: 2018-04-17 | Disposition: A | Discharge status: Home / Self Care | Payer: Self-pay | Source: Ambulatory Visit | Attending: Cardiovascular Disease | Admitting: Cardiovascular Disease

## 2018-04-19 ENCOUNTER — Encounter (HOSPITAL_COMMUNITY)
Admission: RE | Admit: 2018-04-19 | Discharge: 2018-04-19 | Disposition: A | Discharge status: Home / Self Care | Payer: Self-pay | Source: Ambulatory Visit | Attending: Cardiovascular Disease | Admitting: Cardiovascular Disease

## 2018-04-21 ENCOUNTER — Encounter (HOSPITAL_COMMUNITY)
Admission: RE | Admit: 2018-04-21 | Discharge: 2018-04-21 | Disposition: A | Discharge status: Home / Self Care | Payer: Self-pay | Source: Ambulatory Visit | Attending: Cardiovascular Disease | Admitting: Cardiovascular Disease

## 2018-04-24 ENCOUNTER — Encounter (HOSPITAL_COMMUNITY)
Admission: RE | Admit: 2018-04-24 | Discharge: 2018-04-24 | Disposition: A | Discharge status: Home / Self Care | Payer: Self-pay | Source: Ambulatory Visit | Attending: Cardiovascular Disease | Admitting: Cardiovascular Disease

## 2018-04-26 ENCOUNTER — Encounter (HOSPITAL_COMMUNITY)
Admission: RE | Admit: 2018-04-26 | Discharge: 2018-04-26 | Disposition: A | Discharge status: Home / Self Care | Payer: Self-pay | Source: Ambulatory Visit | Attending: Cardiovascular Disease | Admitting: Cardiovascular Disease

## 2018-04-28 ENCOUNTER — Encounter (HOSPITAL_COMMUNITY)
Admission: RE | Admit: 2018-04-28 | Discharge: 2018-04-28 | Disposition: A | Discharge status: Home / Self Care | Payer: Self-pay | Source: Ambulatory Visit | Attending: Cardiovascular Disease | Admitting: Cardiovascular Disease

## 2018-04-28 DIAGNOSIS — I2510 Atherosclerotic heart disease of native coronary artery without angina pectoris: Secondary | ICD-10-CM | POA: Insufficient documentation

## 2018-05-01 ENCOUNTER — Encounter (HOSPITAL_COMMUNITY)
Admission: RE | Admit: 2018-05-01 | Discharge: 2018-05-01 | Disposition: A | Discharge status: Home / Self Care | Payer: Self-pay | Source: Ambulatory Visit | Attending: Cardiovascular Disease | Admitting: Cardiovascular Disease

## 2018-05-03 ENCOUNTER — Encounter (HOSPITAL_COMMUNITY): Payer: Self-pay

## 2018-05-05 ENCOUNTER — Encounter (HOSPITAL_COMMUNITY)
Admission: RE | Admit: 2018-05-05 | Discharge: 2018-05-05 | Disposition: A | Discharge status: Home / Self Care | Payer: Medicare Other | Source: Ambulatory Visit | Attending: Cardiovascular Disease | Admitting: Cardiovascular Disease

## 2018-05-08 ENCOUNTER — Encounter (HOSPITAL_COMMUNITY)
Admission: RE | Admit: 2018-05-08 | Discharge: 2018-05-08 | Disposition: A | Discharge status: Home / Self Care | Payer: Self-pay | Source: Ambulatory Visit | Attending: Cardiovascular Disease | Admitting: Cardiovascular Disease

## 2018-05-10 ENCOUNTER — Encounter (HOSPITAL_COMMUNITY): Payer: Self-pay

## 2018-05-12 ENCOUNTER — Encounter (HOSPITAL_COMMUNITY)
Admission: RE | Admit: 2018-05-12 | Discharge: 2018-05-12 | Disposition: A | Discharge status: Home / Self Care | Payer: Self-pay | Source: Ambulatory Visit | Attending: Cardiovascular Disease | Admitting: Cardiovascular Disease

## 2018-05-15 ENCOUNTER — Encounter (HOSPITAL_COMMUNITY)
Admission: RE | Admit: 2018-05-15 | Discharge: 2018-05-15 | Disposition: A | Discharge status: Home / Self Care | Payer: Medicare Other | Source: Ambulatory Visit | Attending: Cardiovascular Disease | Admitting: Cardiovascular Disease

## 2018-05-17 ENCOUNTER — Encounter (HOSPITAL_COMMUNITY)
Admission: RE | Admit: 2018-05-17 | Discharge: 2018-05-17 | Disposition: A | Discharge status: Home / Self Care | Payer: Self-pay | Source: Ambulatory Visit | Attending: Cardiovascular Disease | Admitting: Cardiovascular Disease

## 2018-05-18 ENCOUNTER — Other Ambulatory Visit: Payer: Self-pay | Admitting: Cardiovascular Disease

## 2018-05-18 DIAGNOSIS — I10 Essential (primary) hypertension: Secondary | ICD-10-CM

## 2018-05-18 DIAGNOSIS — E785 Hyperlipidemia, unspecified: Secondary | ICD-10-CM

## 2018-05-18 DIAGNOSIS — I25719 Atherosclerosis of autologous vein coronary artery bypass graft(s) with unspecified angina pectoris: Secondary | ICD-10-CM

## 2018-05-19 ENCOUNTER — Encounter (HOSPITAL_COMMUNITY)
Admission: RE | Admit: 2018-05-19 | Discharge: 2018-05-19 | Disposition: A | Discharge status: Home / Self Care | Payer: Self-pay | Source: Ambulatory Visit | Attending: Cardiovascular Disease | Admitting: Cardiovascular Disease

## 2018-05-22 ENCOUNTER — Encounter (HOSPITAL_COMMUNITY): Payer: Self-pay

## 2018-05-24 ENCOUNTER — Encounter (HOSPITAL_COMMUNITY)
Admission: RE | Admit: 2018-05-24 | Discharge: 2018-05-24 | Disposition: A | Discharge status: Home / Self Care | Payer: Medicare Other | Source: Ambulatory Visit | Attending: Cardiovascular Disease | Admitting: Cardiovascular Disease

## 2018-05-29 ENCOUNTER — Other Ambulatory Visit: Payer: Self-pay | Admitting: Gastroenterology

## 2018-05-29 ENCOUNTER — Encounter: Payer: Self-pay | Admitting: Cardiovascular Disease

## 2018-05-29 ENCOUNTER — Ambulatory Visit: Payer: Medicare Other | Admitting: Cardiovascular Disease

## 2018-05-29 ENCOUNTER — Encounter (HOSPITAL_COMMUNITY)
Admission: RE | Admit: 2018-05-29 | Discharge: 2018-05-29 | Disposition: A | Discharge status: Home / Self Care | Payer: Self-pay | Source: Ambulatory Visit | Attending: Cardiovascular Disease | Admitting: Cardiovascular Disease

## 2018-05-29 ENCOUNTER — Other Ambulatory Visit: Payer: Self-pay | Admitting: Cardiovascular Disease

## 2018-05-29 VITALS — BP 118/66 | HR 82 | Ht 71.0 in | Wt 222.8 lb

## 2018-05-29 DIAGNOSIS — I25119 Atherosclerotic heart disease of native coronary artery with unspecified angina pectoris: Secondary | ICD-10-CM | POA: Diagnosis not present

## 2018-05-29 DIAGNOSIS — E782 Mixed hyperlipidemia: Secondary | ICD-10-CM

## 2018-05-29 DIAGNOSIS — I10 Essential (primary) hypertension: Secondary | ICD-10-CM

## 2018-05-29 DIAGNOSIS — I2510 Atherosclerotic heart disease of native coronary artery without angina pectoris: Secondary | ICD-10-CM | POA: Insufficient documentation

## 2018-05-29 DIAGNOSIS — I25719 Atherosclerosis of autologous vein coronary artery bypass graft(s) with unspecified angina pectoris: Secondary | ICD-10-CM

## 2018-05-29 NOTE — Patient Instructions (Signed)

## 2018-05-29 NOTE — Progress Notes (Signed)
Cardiology Office Note:    Date:  05/29/2018   ID:  Jeff [name], DOB Oct 11, 1965, MRN [id number]  PCP:  Rodrigo Ran, MD  Cardiologist:  Tonny Bollman, MD  Electrophysiologist:  None   Referring MD: Rodrigo Ran, MD   Chief Complaint  Patient presents with  . Coronary Artery Disease    History of Present Illness:    Jeff [name] is a 52 y.o. male with a hx of CAD, presenting for follow-up evaluation.   Patient underwent multivessel CABG in 2011 complicated by early bypass graft failure.  He underwent multivessel stenting shortly after bypass surgery.  Most recent heart catheterization in 2014 demonstrated patency of his stent sites with a coronary disease pattern involving diffuse small vessel disease.  Medical therapy was recommended.  LV function has been normal.  The patient has been involved in the maintenance phase of cardiac rehab now for several years.  He is doing well from a cardiac perspective. Has not required sublingual NTG since his last visit with me in June of this year. He recently underwent a myocardial perfusion study demonstrating no ischemia. He was considering inguinal hernia surgery but has decided on a period of watchful waiting as he's not having significant symptoms at present. He continues with maintenance cardiac rehab without exertional symptoms.   Past Medical History:  Diagnosis Date  . Anxiety   . CAD (coronary artery disease)    Cath 2011.  LAD stent patent, distal LAD occlusion, D1 100%, OM branch 80 - 90%, SVG to diag patent, LIMA to LAD occluded, SVG to RCA occluded, SVG to OM was occluded.   No change from previous cath.  Managed medically.   . Cataract   . Depression   . Diabetes mellitus type I (HCC)    on insulin pump at  home  . Diabetic retinopathy    s/p vitrectomy and history of retinal surgery  . Hemorrhoid   . History of oxygen administration    nocutural use only at 2 l/m nasally.  [name] HTN (hypertension)   . Hyperlipidemia   .  Myocardial infarction (HCC)   . 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 Seasonal allergic rhinitis   . Seizure disorder (HCC)   . Sleep apnea    s/p oral surgery  . Toe fracture, right    11-23-14 fracture right big toe-wearing a stability shoe    Past Surgical History:  Procedure Laterality Date  . APPENDECTOMY    . CARDIAC CATHETERIZATION    . CATARACT EXTRACTION, BILATERAL    . COLONOSCOPY WITH PROPOFOL N/A 07/21/2017   Procedure: COLONOSCOPY WITH PROPOFOL;  Surgeon: [name], MD;  Location: [facility];  Service: Endoscopy;  Laterality: N/A;  . CORONARY ANGIOPLASTY     9'11,  CABPG grafts had failed.  . CORONARY ARTERY BYPASS GRAFT     4 vessel 4/11  . ESOPHAGOGASTRODUODENOSCOPY (EGD) WITH PROPOFOL N/A 12/05/2014   Procedure: ESOPHAGOGASTRODUODENOSCOPY (EGD) WITH PROPOFOL;  Surgeon: [name], MD;  Location: [facility];  Service: Endoscopy;  Laterality: N/A;  . LAPAROSCOPIC APPENDECTOMY  11/24/2011  . LEFT HEART CATHETERIZATION WITH CORONARY/GRAFT ANGIOGRAM N/A 02/22/2013   Procedure: LEFT HEART CATHETERIZATION WITH [name];  Surgeon: Tonny Bollman, MD;  Location: [facility] CATH LAB;  Service: Cardiovascular;  Laterality: N/A;  . release of right transverse carpal ligament    . right eye vitrectomy and detached retina repair  10/2007  . TONSILLECTOMY    .  uvulopalatoplasty surgery      Current Medications: Current Meds  Medication Sig  . acetaminophen (TYLENOL) 500 MG tablet Take 1,000 mg by mouth every 6 (six) hours as needed for mild pain.  [name] aspirin 81 MG tablet Take 81 mg by mouth daily.   . carbamazepine (TEGRETOL XR) 200 MG 12 hr tablet Take 3 tablets (600 mg total) by mouth 2 (two) times daily. Brand is medically necessary.  . Cholecalciferol (VITAMIN D) 2000 units CAPS Take 2,000 Units by mouth daily.   [name] desvenlafaxine (PRISTIQ) 100 MG 24 hr tablet Take 100 mg by mouth daily.  [name] docusate sodium (COLACE) 100  MG capsule Take 100 mg by mouth at bedtime.   [name] ezetimibe (ZETIA) 10 MG tablet Take 10 mg by mouth at bedtime.   . insulin lispro (HUMALOG) 100 UNIT/ML injection Inject into the skin See admin instructions. Uses via Insulin Pump  . isosorbide mononitrate (IMDUR) 60 MG 24 hr tablet Take 60 mg by mouth every morning.   . levETIRAcetam (KEPPRA) 750 MG tablet Take 1 tablet (750 mg total) by mouth 2 (two) times daily.  [name] lisinopril (PRINIVIL,ZESTRIL) 5 MG tablet Take 5 mg by mouth every morning.   . metoprolol tartrate (LOPRESSOR) 25 MG tablet TAKE 1 TABLET(25 MG) BY MOUTH TWICE DAILY  . Multiple Vitamins-Minerals (CENTRUM SILVER 50+MEN) TABS Take 1 tablet by mouth every morning.  . nitroGLYCERIN (NITROSTAT) 0.4 MG SL tablet place 1 tablet under the tongue if needed every 5 minutes for chest pain  . omeprazole (PRILOSEC) 20 MG capsule Take 1 capsule (20 mg total) by mouth daily.  . OXYGEN Place 2 L into the nose at bedtime.  . rosuvastatin (CRESTOR) 40 MG tablet Take 40 mg by mouth at bedtime.   . vitamin B-12 (CYANOCOBALAMIN) 500 MCG tablet Take 500 mcg by mouth daily.     Allergies:   Latex   Social History   Socioeconomic History  . Marital status: Married    Spouse name: Almyra Free  . Number of children: 0  . Years of education: college  . Highest education level: Not on file  Occupational History  . Occupation: Unemployed    Comment: previously worked in Chief Strategy [employer]  . Financial resource strain: Not on file  . Food insecurity:    Worry: Not on file    Inability: Not on file  . Transportation needs:    Medical: Not on file    Non-medical: Not on file  Tobacco Use  . Smoking status: Never Smoker  . Smokeless tobacco: Never Used  Substance and Sexual Activity  . Alcohol use: No  . Drug use: No  . Sexual activity: Yes  Lifestyle  . Physical activity:    Days per week: Not on file    Minutes per session: Not on file  . Stress: Not on file  Relationships  . Social  connections:    Talks on phone: Not on file    Gets together: Not on file    Attends religious service: Not on file    Active member of club or organization: Not on file    Attends meetings of clubs or organizations: Not on file    Relationship status: Not on file  Other Topics Concern  . Not on file  Social History Narrative   Patient lives with his wifeLanora Manis).   Patient does not have any children.   Patient is on disability.   Patient is left-handed (writes), but right hand with  sports.   Patient has a college education.   Patient drinks 5-6 cups of caffeine daily.     Family History: The patient's family history includes Arthritis in his sister; Coronary artery disease (age of onset: 1) in his father; Crohn's disease in his mother; Diabetes in his father; Hearing loss in his mother; Hypertension in his father; Squamous cell carcinoma in his brother.  ROS:   Please see the history of present illness.    Positive for hearing loss, visual disturbance, cough, depression, leg pain, anxiety, difficulty with urination. All other systems reviewed and are negative.  EKGs/Labs/Other Studies Reviewed:    The following studies were reviewed today: Myocardial Perfusion Study 04-11-2018: Study Highlights    Nuclear stress EF: 70%.  The left ventricular ejection fraction is hyperdynamic (>65%).  There was no ST segment deviation noted during stress. Transient second degree and third degree AV block were noted during infusion.  The study is normal.  This is a low risk study.   EKG:  EKG is ordered today.  The ekg ordered today demonstrates NSR 82 bpm, incomplete RBBB - no significant change from previous.  Recent Labs: 10/31/2017: ALT 28 03/09/2018: BUN 11; Creatinine, Ser 0.77; Hemoglobin 15.3; Platelets 182; Potassium 4.5; Sodium 138  Recent Lipid Panel    Component Value Date/Time   CHOL 138 06/03/2011 0640   TRIG 111 06/03/2011 0640   HDL 42 06/03/2011 0640   CHOLHDL  3.3 06/03/2011 0640   VLDL 22 06/03/2011 0640   LDLCALC 74 06/03/2011 0640    Physical Exam:    VS:  BP 118/66   Pulse 82   Ht 5\' 11"  (1.803 m)   Wt 222 lb 12.8 oz (101.1 kg)   SpO2 96%   BMI 31.07 kg/m     Wt Readings from Last 3 Encounters:  05/29/18 222 lb 12.8 oz (101.1 kg)  04/11/18 229 lb (103.9 kg)  03/15/18 224 lb 8 oz (101.8 kg)     GEN:  Well nourished, well developed in no acute distress HEENT: Normal NECK: No JVD; No carotid bruits LYMPHATICS: No lymphadenopathy CARDIAC: RRR, no murmurs, rubs, gallops RESPIRATORY:  Clear to auscultation without rales, wheezing or rhonchi  ABDOMEN: Soft, non-tender, non-distended MUSCULOSKELETAL:  No edema; No deformity  SKIN: Warm and dry NEUROLOGIC:  Alert and oriented x 3 PSYCHIATRIC:  Normal affect   ASSESSMENT:    1. Coronary artery disease involving native coronary artery of native heart with angina pectoris (HCC)   2. Mixed hyperlipidemia    PLAN:    In order of problems listed above:  1. The patient is stable.  He is not having any anginal symptoms with his cardiac rehab exercises.  He continues on a long-acting nitrate and a beta-blocker.  His myocardial perfusion scan is reassuring and demonstrates normal perfusion with preserved LVEF. 2. The patient's lipids are reviewed from June 2019 when his LDL cholesterol was 68 mg/dL.  He will continue on a combination of rosuvastatin and ezetimibe with lipids followed by Dr. Waynard Edwards.   Medication Adjustments/Labs and Tests Ordered: Current medicines are reviewed at length with the patient today.  Concerns regarding medicines are outlined above.  No orders of the defined types were placed in this encounter.  No orders of the defined types were placed in this encounter.   There are no Patient Instructions on file for this visit.   Signed, Tonny Bollman, MD  05/29/2018 10:23 AM    [facility]

## 2018-05-31 ENCOUNTER — Encounter (HOSPITAL_COMMUNITY)
Admission: RE | Admit: 2018-05-31 | Discharge: 2018-05-31 | Disposition: A | Discharge status: Home / Self Care | Payer: Self-pay | Source: Ambulatory Visit | Attending: Cardiovascular Disease | Admitting: Cardiovascular Disease

## 2018-06-02 ENCOUNTER — Encounter (HOSPITAL_COMMUNITY): Payer: Self-pay

## 2018-06-05 ENCOUNTER — Encounter (HOSPITAL_COMMUNITY)
Admission: RE | Admit: 2018-06-05 | Discharge: 2018-06-05 | Disposition: A | Discharge status: Home / Self Care | Payer: Medicare Other | Source: Ambulatory Visit | Attending: Cardiovascular Disease | Admitting: Cardiovascular Disease

## 2018-06-07 ENCOUNTER — Encounter (HOSPITAL_COMMUNITY)
Admission: RE | Admit: 2018-06-07 | Discharge: 2018-06-07 | Disposition: A | Discharge status: Home / Self Care | Payer: Self-pay | Source: Ambulatory Visit | Attending: Cardiovascular Disease | Admitting: Cardiovascular Disease

## 2018-06-09 ENCOUNTER — Encounter (HOSPITAL_COMMUNITY)
Admission: RE | Admit: 2018-06-09 | Discharge: 2018-06-09 | Disposition: A | Discharge status: Home / Self Care | Payer: Medicare Other | Source: Ambulatory Visit | Attending: Cardiovascular Disease | Admitting: Cardiovascular Disease

## 2018-06-12 ENCOUNTER — Encounter (HOSPITAL_COMMUNITY)
Admission: RE | Admit: 2018-06-12 | Discharge: 2018-06-12 | Disposition: A | Discharge status: Home / Self Care | Payer: Self-pay | Source: Ambulatory Visit | Attending: Cardiovascular Disease | Admitting: Cardiovascular Disease

## 2018-06-14 ENCOUNTER — Encounter (HOSPITAL_COMMUNITY)
Admission: RE | Admit: 2018-06-14 | Discharge: 2018-06-14 | Disposition: A | Discharge status: Home / Self Care | Payer: Self-pay | Source: Ambulatory Visit | Attending: Cardiovascular Disease | Admitting: Cardiovascular Disease

## 2018-06-16 ENCOUNTER — Encounter (HOSPITAL_COMMUNITY)
Admission: RE | Admit: 2018-06-16 | Discharge: 2018-06-16 | Disposition: A | Discharge status: Home / Self Care | Payer: Self-pay | Source: Ambulatory Visit | Attending: Cardiovascular Disease | Admitting: Cardiovascular Disease

## 2018-06-19 ENCOUNTER — Encounter (HOSPITAL_COMMUNITY): Payer: Self-pay

## 2018-06-23 ENCOUNTER — Encounter (HOSPITAL_COMMUNITY): Payer: Self-pay

## 2018-06-26 ENCOUNTER — Encounter (HOSPITAL_COMMUNITY)
Admission: RE | Admit: 2018-06-26 | Discharge: 2018-06-26 | Disposition: A | Discharge status: Home / Self Care | Payer: Self-pay | Source: Ambulatory Visit | Attending: Cardiovascular Disease | Admitting: Cardiovascular Disease

## 2018-06-26 ENCOUNTER — Ambulatory Visit: Payer: Medicare Other | Admitting: Neurology

## 2018-06-30 ENCOUNTER — Encounter (HOSPITAL_COMMUNITY)
Admission: RE | Admit: 2018-06-30 | Discharge: 2018-06-30 | Disposition: A | Discharge status: Home / Self Care | Payer: Self-pay | Source: Ambulatory Visit | Attending: Cardiovascular Disease | Admitting: Cardiovascular Disease

## 2018-06-30 DIAGNOSIS — I2510 Atherosclerotic heart disease of native coronary artery without angina pectoris: Secondary | ICD-10-CM | POA: Insufficient documentation

## 2018-07-03 ENCOUNTER — Encounter (HOSPITAL_COMMUNITY)
Admission: RE | Admit: 2018-07-03 | Discharge: 2018-07-03 | Disposition: A | Discharge status: Home / Self Care | Payer: Self-pay | Source: Ambulatory Visit | Attending: Cardiovascular Disease | Admitting: Cardiovascular Disease

## 2018-07-05 ENCOUNTER — Encounter (HOSPITAL_COMMUNITY): Payer: Self-pay

## 2018-07-07 ENCOUNTER — Encounter (HOSPITAL_COMMUNITY): Payer: Self-pay

## 2018-07-10 ENCOUNTER — Encounter (HOSPITAL_COMMUNITY)
Admission: RE | Admit: 2018-07-10 | Discharge: 2018-07-10 | Disposition: A | Discharge status: Home / Self Care | Payer: Medicare Other | Source: Ambulatory Visit | Attending: Cardiovascular Disease | Admitting: Cardiovascular Disease

## 2018-07-12 ENCOUNTER — Encounter (HOSPITAL_COMMUNITY)
Admission: RE | Admit: 2018-07-12 | Discharge: 2018-07-12 | Disposition: A | Discharge status: Home / Self Care | Payer: Self-pay | Source: Ambulatory Visit | Attending: Cardiovascular Disease | Admitting: Cardiovascular Disease

## 2018-07-14 ENCOUNTER — Encounter (HOSPITAL_COMMUNITY)
Admission: RE | Admit: 2018-07-14 | Discharge: 2018-07-14 | Disposition: A | Discharge status: Home / Self Care | Payer: Self-pay | Source: Ambulatory Visit | Attending: Cardiovascular Disease | Admitting: Cardiovascular Disease

## 2018-07-17 ENCOUNTER — Encounter (HOSPITAL_COMMUNITY)
Admission: RE | Admit: 2018-07-17 | Discharge: 2018-07-17 | Disposition: A | Discharge status: Home / Self Care | Payer: Self-pay | Source: Ambulatory Visit | Attending: Cardiovascular Disease | Admitting: Cardiovascular Disease

## 2018-07-19 ENCOUNTER — Encounter (HOSPITAL_COMMUNITY)
Admission: RE | Admit: 2018-07-19 | Discharge: 2018-07-19 | Disposition: A | Discharge status: Home / Self Care | Payer: Self-pay | Source: Ambulatory Visit | Attending: Cardiovascular Disease | Admitting: Cardiovascular Disease

## 2018-07-21 ENCOUNTER — Encounter (HOSPITAL_COMMUNITY)
Admission: RE | Admit: 2018-07-21 | Discharge: 2018-07-21 | Disposition: A | Discharge status: Home / Self Care | Payer: Self-pay | Source: Ambulatory Visit | Attending: Cardiovascular Disease | Admitting: Cardiovascular Disease

## 2018-07-24 ENCOUNTER — Encounter (HOSPITAL_COMMUNITY): Payer: Self-pay

## 2018-07-26 ENCOUNTER — Encounter (HOSPITAL_COMMUNITY)
Admission: RE | Admit: 2018-07-26 | Discharge: 2018-07-26 | Disposition: A | Discharge status: Home / Self Care | Payer: Self-pay | Source: Ambulatory Visit | Attending: Cardiovascular Disease | Admitting: Cardiovascular Disease

## 2018-07-28 ENCOUNTER — Encounter (HOSPITAL_COMMUNITY)
Admission: RE | Admit: 2018-07-28 | Discharge: 2018-07-28 | Disposition: A | Discharge status: Home / Self Care | Payer: Self-pay | Source: Ambulatory Visit | Attending: Cardiovascular Disease | Admitting: Cardiovascular Disease

## 2018-07-31 ENCOUNTER — Encounter (HOSPITAL_COMMUNITY): Payer: Self-pay

## 2018-07-31 DIAGNOSIS — I2510 Atherosclerotic heart disease of native coronary artery without angina pectoris: Secondary | ICD-10-CM | POA: Insufficient documentation

## 2018-08-02 ENCOUNTER — Encounter (HOSPITAL_COMMUNITY)
Admission: RE | Admit: 2018-08-02 | Discharge: 2018-08-02 | Disposition: A | Discharge status: Home / Self Care | Payer: Self-pay | Source: Ambulatory Visit | Attending: Cardiovascular Disease | Admitting: Cardiovascular Disease

## 2018-08-04 ENCOUNTER — Encounter (HOSPITAL_COMMUNITY)
Admission: RE | Admit: 2018-08-04 | Discharge: 2018-08-04 | Disposition: A | Discharge status: Home / Self Care | Payer: Self-pay | Source: Ambulatory Visit | Attending: Cardiovascular Disease | Admitting: Cardiovascular Disease

## 2018-08-07 ENCOUNTER — Encounter (HOSPITAL_COMMUNITY)
Admission: RE | Admit: 2018-08-07 | Discharge: 2018-08-07 | Disposition: A | Discharge status: Home / Self Care | Payer: Medicare Other | Source: Ambulatory Visit | Attending: Cardiovascular Disease | Admitting: Cardiovascular Disease

## 2018-08-09 ENCOUNTER — Encounter (HOSPITAL_COMMUNITY)
Admission: RE | Admit: 2018-08-09 | Discharge: 2018-08-09 | Disposition: A | Discharge status: Home / Self Care | Payer: Self-pay | Source: Ambulatory Visit | Attending: Cardiovascular Disease | Admitting: Cardiovascular Disease

## 2018-08-11 ENCOUNTER — Encounter (HOSPITAL_COMMUNITY): Payer: Self-pay

## 2018-08-14 ENCOUNTER — Encounter (HOSPITAL_COMMUNITY)
Admission: RE | Admit: 2018-08-14 | Discharge: 2018-08-14 | Disposition: A | Discharge status: Home / Self Care | Payer: Self-pay | Source: Ambulatory Visit | Attending: Cardiovascular Disease | Admitting: Cardiovascular Disease

## 2018-08-14 ENCOUNTER — Other Ambulatory Visit: Payer: Self-pay | Admitting: Neurology

## 2018-08-16 ENCOUNTER — Encounter (HOSPITAL_COMMUNITY)
Admission: RE | Admit: 2018-08-16 | Discharge: 2018-08-16 | Disposition: A | Discharge status: Home / Self Care | Payer: Self-pay | Source: Ambulatory Visit | Attending: Cardiovascular Disease | Admitting: Cardiovascular Disease

## 2018-08-18 ENCOUNTER — Encounter (HOSPITAL_COMMUNITY): Payer: Self-pay

## 2018-08-21 ENCOUNTER — Encounter (HOSPITAL_COMMUNITY)
Admission: RE | Admit: 2018-08-21 | Discharge: 2018-08-21 | Disposition: A | Discharge status: Home / Self Care | Payer: Self-pay | Source: Ambulatory Visit | Attending: Cardiovascular Disease | Admitting: Cardiovascular Disease

## 2018-08-23 ENCOUNTER — Encounter (HOSPITAL_COMMUNITY)
Admission: RE | Admit: 2018-08-23 | Discharge: 2018-08-23 | Disposition: A | Discharge status: Home / Self Care | Payer: Self-pay | Source: Ambulatory Visit | Attending: Cardiovascular Disease | Admitting: Cardiovascular Disease

## 2018-08-25 ENCOUNTER — Encounter (HOSPITAL_COMMUNITY)
Admission: RE | Admit: 2018-08-25 | Discharge: 2018-08-25 | Disposition: A | Discharge status: Home / Self Care | Payer: Self-pay | Source: Ambulatory Visit | Attending: Cardiovascular Disease | Admitting: Cardiovascular Disease

## 2018-08-27 ENCOUNTER — Other Ambulatory Visit: Payer: Self-pay | Admitting: Gastroenterology

## 2018-08-28 ENCOUNTER — Encounter (HOSPITAL_COMMUNITY)
Admission: RE | Admit: 2018-08-28 | Discharge: 2018-08-28 | Disposition: A | Discharge status: Home / Self Care | Payer: Self-pay | Source: Ambulatory Visit | Attending: Cardiovascular Disease | Admitting: Cardiovascular Disease

## 2018-08-28 DIAGNOSIS — I2510 Atherosclerotic heart disease of native coronary artery without angina pectoris: Secondary | ICD-10-CM | POA: Insufficient documentation

## 2018-08-30 ENCOUNTER — Encounter (HOSPITAL_COMMUNITY)
Admission: RE | Admit: 2018-08-30 | Discharge: 2018-08-30 | Disposition: A | Discharge status: Home / Self Care | Payer: Self-pay | Source: Ambulatory Visit | Attending: Cardiovascular Disease | Admitting: Cardiovascular Disease

## 2018-09-01 ENCOUNTER — Encounter (HOSPITAL_COMMUNITY): Payer: Self-pay

## 2018-09-04 ENCOUNTER — Encounter (HOSPITAL_COMMUNITY)
Admission: RE | Admit: 2018-09-04 | Discharge: 2018-09-04 | Disposition: A | Discharge status: Home / Self Care | Payer: Self-pay | Source: Ambulatory Visit | Attending: Cardiovascular Disease | Admitting: Cardiovascular Disease

## 2018-09-06 ENCOUNTER — Encounter (HOSPITAL_COMMUNITY)
Admission: RE | Admit: 2018-09-06 | Discharge: 2018-09-06 | Disposition: A | Discharge status: Home / Self Care | Payer: Self-pay | Source: Ambulatory Visit | Attending: Cardiovascular Disease | Admitting: Cardiovascular Disease

## 2018-09-06 ENCOUNTER — Other Ambulatory Visit: Payer: Self-pay

## 2018-09-08 ENCOUNTER — Encounter (HOSPITAL_COMMUNITY)
Admission: RE | Admit: 2018-09-08 | Discharge: 2018-09-08 | Disposition: A | Discharge status: Home / Self Care | Payer: Self-pay | Source: Ambulatory Visit | Attending: Cardiovascular Disease | Admitting: Cardiovascular Disease

## 2018-09-08 ENCOUNTER — Other Ambulatory Visit: Payer: Self-pay

## 2018-09-11 ENCOUNTER — Encounter (HOSPITAL_COMMUNITY): Payer: Self-pay

## 2018-09-11 ENCOUNTER — Telehealth (HOSPITAL_COMMUNITY): Payer: Self-pay | Admitting: *Deleted

## 2018-09-11 NOTE — Telephone Encounter (Signed)
Contacted patient to notify of Cardiac Rehab department closure x2 weeks. Message left on patient's voicemail. Kayvan Hoefling, Exercise Physiologist Cardiac and Pulmonary Rehabilitaiton 

## 2018-09-13 ENCOUNTER — Encounter (HOSPITAL_COMMUNITY): Payer: Self-pay

## 2018-09-15 ENCOUNTER — Encounter (HOSPITAL_COMMUNITY): Payer: Self-pay

## 2018-09-18 ENCOUNTER — Encounter (HOSPITAL_COMMUNITY): Payer: Self-pay

## 2018-09-19 ENCOUNTER — Telehealth (HOSPITAL_COMMUNITY): Payer: Self-pay | Admitting: *Deleted

## 2018-09-19 NOTE — Telephone Encounter (Signed)
Called to notify patient that the cardiac and pulmonary rehabilitation department will be closed for 4 weeks due to COVID-19 restrictions. Message left on voicemail. Karenann Mcgrory M Collyns Mcquigg, MS, ACSM CEP  

## 2018-09-20 ENCOUNTER — Encounter (HOSPITAL_COMMUNITY): Payer: Self-pay

## 2018-09-22 ENCOUNTER — Encounter (HOSPITAL_COMMUNITY): Payer: Self-pay

## 2018-09-25 ENCOUNTER — Encounter (HOSPITAL_COMMUNITY): Payer: Self-pay

## 2018-09-27 ENCOUNTER — Encounter (HOSPITAL_COMMUNITY): Payer: Self-pay

## 2018-09-29 ENCOUNTER — Encounter (HOSPITAL_COMMUNITY): Payer: Self-pay

## 2018-10-02 ENCOUNTER — Encounter (HOSPITAL_COMMUNITY): Payer: Self-pay

## 2018-10-04 ENCOUNTER — Encounter (HOSPITAL_COMMUNITY): Payer: Self-pay

## 2018-10-06 ENCOUNTER — Encounter (HOSPITAL_COMMUNITY): Payer: Self-pay

## 2018-10-06 ENCOUNTER — Telehealth (HOSPITAL_COMMUNITY): Payer: Self-pay | Admitting: *Deleted

## 2018-10-06 NOTE — Telephone Encounter (Signed)
Called to notify patient that the cardiac and pulmonary rehabilitation department remains closed at this time due to COVID-19 restrictions. Message left on voicemail.  Artist Pais, MS, ACSM CEP 10/06/2018 1108

## 2018-10-09 ENCOUNTER — Encounter (HOSPITAL_COMMUNITY): Payer: Self-pay

## 2018-10-11 ENCOUNTER — Encounter (HOSPITAL_COMMUNITY): Payer: Self-pay

## 2018-10-13 ENCOUNTER — Encounter (HOSPITAL_COMMUNITY): Payer: Self-pay

## 2018-10-16 ENCOUNTER — Encounter (HOSPITAL_COMMUNITY): Payer: Self-pay

## 2018-10-18 ENCOUNTER — Encounter (HOSPITAL_COMMUNITY): Payer: Self-pay

## 2018-10-20 ENCOUNTER — Encounter (HOSPITAL_COMMUNITY): Payer: Self-pay

## 2018-10-23 ENCOUNTER — Encounter (HOSPITAL_COMMUNITY): Payer: Self-pay

## 2018-10-25 ENCOUNTER — Encounter (HOSPITAL_COMMUNITY): Payer: Self-pay

## 2018-10-27 ENCOUNTER — Encounter (HOSPITAL_COMMUNITY): Payer: Self-pay

## 2018-10-30 ENCOUNTER — Encounter (HOSPITAL_COMMUNITY): Payer: Self-pay

## 2018-11-01 ENCOUNTER — Encounter (HOSPITAL_COMMUNITY): Payer: Self-pay

## 2018-11-02 ENCOUNTER — Ambulatory Visit: Payer: Medicare Other | Admitting: Neurology

## 2018-11-02 ENCOUNTER — Telehealth: Payer: Self-pay

## 2018-11-02 NOTE — Telephone Encounter (Signed)
I contacted the pt and left a vm in regards to rescheduling his 11/02/18 appt. Pt has been removed from the schedule since we could confirm if he could do a virtual video or telephone visit.

## 2018-11-03 ENCOUNTER — Encounter (HOSPITAL_COMMUNITY): Payer: Self-pay

## 2018-11-06 ENCOUNTER — Encounter (HOSPITAL_COMMUNITY): Payer: Self-pay

## 2018-11-08 ENCOUNTER — Encounter (HOSPITAL_COMMUNITY): Payer: Self-pay

## 2018-11-09 ENCOUNTER — Other Ambulatory Visit: Payer: Self-pay | Admitting: Neurology

## 2018-11-09 ENCOUNTER — Other Ambulatory Visit: Payer: Self-pay | Admitting: Cardiovascular Disease

## 2018-11-09 DIAGNOSIS — I10 Essential (primary) hypertension: Secondary | ICD-10-CM

## 2018-11-09 DIAGNOSIS — E785 Hyperlipidemia, unspecified: Secondary | ICD-10-CM

## 2018-11-09 DIAGNOSIS — I25719 Atherosclerosis of autologous vein coronary artery bypass graft(s) with unspecified angina pectoris: Secondary | ICD-10-CM

## 2018-11-10 ENCOUNTER — Encounter (HOSPITAL_COMMUNITY): Payer: Self-pay

## 2018-11-13 ENCOUNTER — Encounter (HOSPITAL_COMMUNITY): Payer: Self-pay

## 2018-11-15 ENCOUNTER — Encounter (HOSPITAL_COMMUNITY): Payer: Self-pay

## 2018-11-17 ENCOUNTER — Encounter (HOSPITAL_COMMUNITY): Payer: Self-pay

## 2018-11-22 ENCOUNTER — Encounter (HOSPITAL_COMMUNITY): Payer: Self-pay

## 2018-11-23 ENCOUNTER — Other Ambulatory Visit: Payer: Self-pay | Admitting: Gastroenterology

## 2018-11-24 ENCOUNTER — Encounter (HOSPITAL_COMMUNITY): Payer: Self-pay

## 2018-11-27 ENCOUNTER — Encounter (HOSPITAL_COMMUNITY): Payer: Self-pay

## 2018-11-29 ENCOUNTER — Encounter (HOSPITAL_COMMUNITY): Payer: Self-pay

## 2018-12-01 ENCOUNTER — Encounter (HOSPITAL_COMMUNITY): Payer: Self-pay

## 2018-12-04 ENCOUNTER — Encounter (HOSPITAL_COMMUNITY): Payer: Self-pay

## 2018-12-06 ENCOUNTER — Encounter (HOSPITAL_COMMUNITY): Payer: Self-pay

## 2018-12-08 ENCOUNTER — Encounter (HOSPITAL_COMMUNITY): Payer: Self-pay

## 2018-12-11 ENCOUNTER — Encounter (HOSPITAL_COMMUNITY): Payer: Self-pay

## 2018-12-13 ENCOUNTER — Encounter (HOSPITAL_COMMUNITY): Payer: Self-pay

## 2018-12-15 ENCOUNTER — Encounter (HOSPITAL_COMMUNITY): Payer: Self-pay

## 2018-12-18 ENCOUNTER — Encounter (HOSPITAL_COMMUNITY): Payer: Self-pay

## 2018-12-19 ENCOUNTER — Telehealth: Payer: Self-pay

## 2018-12-19 NOTE — Telephone Encounter (Signed)
    COVID-19 Pre-Screening Questions:  . In the past 7 to 10 days have you had a cough,  shortness of breath, headache, congestion, fever (100 or greater) body aches, chills, sore throat, or sudden loss of taste or sense of smell? NO . Have you been around anyone with known Covid 19? NO . Have you been around anyone who is awaiting Covid 19 test results in the past 7 to 10 days? NO . Have you been around anyone who has been exposed to Covid 19, or has mentioned symptoms of Covid 19 within the past 7 to 10 days? NO  If you have any concerns/questions about symptoms patients report during screening (either on the phone or at threshold). Contact the provider seeing the patient or DOD for further guidance.  If neither are available contact a member of the leadership team.   Pt has answered no to all prescreening questions and is aware of in office protocol.

## 2018-12-20 ENCOUNTER — Ambulatory Visit (INDEPENDENT_AMBULATORY_CARE_PROVIDER_SITE_OTHER): Payer: Medicare Other | Admitting: Cardiovascular Disease

## 2018-12-20 ENCOUNTER — Encounter: Payer: Self-pay | Admitting: Cardiovascular Disease

## 2018-12-20 ENCOUNTER — Encounter (HOSPITAL_COMMUNITY): Payer: Self-pay

## 2018-12-20 ENCOUNTER — Other Ambulatory Visit: Payer: Self-pay

## 2018-12-20 VITALS — BP 130/72 | HR 84 | Ht 71.0 in | Wt 222.8 lb

## 2018-12-20 DIAGNOSIS — E782 Mixed hyperlipidemia: Secondary | ICD-10-CM | POA: Diagnosis not present

## 2018-12-20 DIAGNOSIS — I25119 Atherosclerotic heart disease of native coronary artery with unspecified angina pectoris: Secondary | ICD-10-CM

## 2018-12-20 NOTE — Patient Instructions (Signed)
Medication Instructions:  Your provider recommends that you continue on your current medications as directed. Please refer to the Current Medication list given to you today.    Labwork: None  Testing/Procedures: None  Follow-Up: Your provider wants you to follow-up in: 6 months with Dr. Cooper. You will receive a reminder letter in the mail two months in advance. If you don't receive a letter, please call our office to schedule the follow-up appointment.    

## 2018-12-20 NOTE — Progress Notes (Signed)
Cardiology Office Note:    Date:  12/20/2018   ID:  Jeff GrumblingRichard N Kleinpeter, DOB 04/26/1966, MRN [id number]  PCP:  Rodrigo RanPerini, Mark, MD  Cardiologist:  Tonny BollmanMichael Chaylee Ehrsam, MD  Electrophysiologist:  None   Referring MD: Rodrigo RanPerini, Mark, MD   Chief Complaint  Patient presents with  . Fatigue    History of Present Illness:    Jeff GrumblingRichard N Ruderman is a 53 y.o. male with a hx of extensive CAD, presenting for follow-up evaluation.  The patient underwent multivessel CABG in 2011 complicated by early bypass graft failure. He underwent multivessel stenting shortly after bypass surgery. Most recent heart catheterization in 2014 demonstrated patency of his stent sites with a coronary disease pattern involving diffuse small vessel disease. Medical therapy was recommended. LV function has been normal. The patient has been involved in the maintenance phase of cardiac rehab now for several years, currently on hold because of Covid-19.  The patient is here alone today. He denies symptoms of palpitations, chest pain, shortness of breath, orthopnea, PND, lower extremity edema, dizziness, or syncope. He hasn't been as active as in the past because of cardiac rehab being closed. He has been moving a family member and carrying some boxes with no exertional angina associated with that level of activity. He does endorse mild dyspnea and fatigue. States his 'sleep cycle is out of whack.'    Past Medical History:  Diagnosis Date  . Anxiety   . CAD (coronary artery disease)    Cath 2011.  LAD stent patent, distal LAD occlusion, D1 100%, OM branch 80 - 90%, SVG to diag patent, LIMA to LAD occluded, SVG to RCA occluded, SVG to OM was occluded.   No change from previous cath.  Managed medically.   . Cataract   . Depression   . Diabetes mellitus type I (HCC)    on insulin pump at  home  . Diabetic retinopathy    s/p vitrectomy and history of retinal surgery  . Hemorrhoid   . History of oxygen administration    nocutural use  only at 2 l/m nasally.  [name]. HTN (hypertension)   . Hyperlipidemia   . Myocardial infarction (HCC)   . 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 Seasonal allergic rhinitis   . Seizure disorder (HCC)   . Sleep apnea    s/p oral surgery  . Toe fracture, right    11-23-14 fracture right big toe-wearing a stability shoe    Past Surgical History:  Procedure Laterality Date  . APPENDECTOMY    . CARDIAC CATHETERIZATION    . CATARACT EXTRACTION, BILATERAL    . COLONOSCOPY WITH PROPOFOL N/A 07/21/2017   Procedure: COLONOSCOPY WITH PROPOFOL;  Surgeon: Rachael FeeJacobs, Daniel P, MD;  Location: [facility];  Service: Endoscopy;  Laterality: N/A;  . CORONARY ANGIOPLASTY     9'11,  CABPG grafts had failed.  . CORONARY ARTERY BYPASS GRAFT     4 vessel 4/11  . ESOPHAGOGASTRODUODENOSCOPY (EGD) WITH PROPOFOL N/A 12/05/2014   Procedure: ESOPHAGOGASTRODUODENOSCOPY (EGD) WITH PROPOFOL;  Surgeon: [name], MD;  Location: [facility];  Service: Endoscopy;  Laterality: N/A;  . LAPAROSCOPIC APPENDECTOMY  11/24/2011  . LEFT HEART CATHETERIZATION WITH CORONARY/GRAFT ANGIOGRAM N/A 02/22/2013   Procedure: LEFT HEART CATHETERIZATION WITH Isabel CapriceORONARY/GRAFT ANGIOGRAM;  Surgeon: Tonny BollmanMichael Alydia Gosser, MD;  [facility];  Service: Cardiovascular;  Laterality: N/A;  . release of right transverse carpal ligament    . right eye vitrectomy and detached retina  repair  10/2007  . TONSILLECTOMY    . uvulopalatoplasty surgery      Current Medications: Current Meds  Medication Sig  . acetaminophen (TYLENOL) 500 MG tablet Take 1,000 mg by mouth every 6 (six) hours as needed for mild pain.  [name] aspirin 81 MG tablet Take 81 mg by mouth daily.   . Cholecalciferol (VITAMIN D) 2000 units CAPS Take 2,000 Units by mouth daily.   [name] desvenlafaxine (PRISTIQ) 100 MG 24 hr tablet Take 100 mg by mouth daily.  [name] docusate sodium (COLACE) 100 MG capsule Take 100 mg by mouth at bedtime.   [name] ezetimibe (ZETIA) 10 MG  tablet Take 10 mg by mouth at bedtime.   . insulin lispro (HUMALOG) 100 UNIT/ML injection Inject into the skin See admin instructions. Uses via Insulin Pump  . isosorbide mononitrate (IMDUR) 60 MG 24 hr tablet Take 60 mg by mouth every morning.   . levETIRAcetam (KEPPRA) 750 MG tablet TAKE 1 TABLET(750 MG) BY MOUTH TWICE DAILY  . lisinopril (PRINIVIL,ZESTRIL) 5 MG tablet Take 5 mg by mouth every morning.   . metoprolol tartrate (LOPRESSOR) 25 MG tablet TAKE 1 TABLET(25 MG) BY MOUTH TWICE DAILY  . Multiple Vitamins-Minerals (CENTRUM SILVER 50+MEN) TABS Take 1 tablet by mouth every morning.  . nitroGLYCERIN (NITROSTAT) 0.4 MG SL tablet PLACE 1 TABLET UNDER THE TONGUE EVERY 5 MINUTES AS NEEDED FOR CHEST PAIN  . omeprazole (PRILOSEC) 20 MG capsule TAKE 1 CAPSULE(20 MG) BY MOUTH DAILY  . OXYGEN Place 2 L into the nose at bedtime.  . rosuvastatin (CRESTOR) 40 MG tablet Take 40 mg by mouth at bedtime.   . TEGRETOL-XR 200 MG 12 hr tablet TAKE 3 TABLETS BY MOUTH TWO TIMES A DAY.  . vitamin B-12 (CYANOCOBALAMIN) 500 MCG tablet Take 500 mcg by mouth daily.     Allergies:   Latex   Social History   Socioeconomic History  . Marital status: Married    Spouse name: [name]  . Number of children: 0  . Years of education: college  . Highest education level: Not on file  Occupational History  . Occupation: Unemployed    Comment: previously worked in Geographical information systems officer  . Financial resource strain: Not on file  . Food insecurity    Worry: Not on file    Inability: Not on file  . Transportation needs    Medical: Not on file    Non-medical: Not on file  Tobacco Use  . Smoking status: Never Smoker  . Smokeless tobacco: Never Used  Substance and Sexual Activity  . Alcohol use: No  . Drug use: No  . Sexual activity: Yes  Lifestyle  . Physical activity    Days per week: Not on file    Minutes per session: Not on file  . Stress: Not on file  Relationships  . Social Herbalist on  phone: Not on file    Gets together: Not on file    Attends religious service: Not on file    Active member of club or organization: Not on file    Attends meetings of clubs or organizations: Not on file    Relationship status: Not on file  Other Topics Concern  . Not on file  Social History Narrative   Patient lives with his wifeBenjamine Mola).   Patient does not have any children.   Patient is on disability.   Patient is left-handed (writes), but right hand with sports.   Patient has  a college education.   Patient drinks 5-6 cups of caffeine daily.     Family History: The patient's family history includes Arthritis in his sister; Coronary artery disease (age of onset: 45) in his father; Crohn's disease in his mother; Diabetes in his father; Hearing loss in his mother; Hypertension in his father; Squamous cell carcinoma in his brother.  ROS:   Please see the history of present illness.    All other systems reviewed and are negative.  EKGs/Labs/Other Studies Reviewed:    The following studies were reviewed today: Myoview Scan 04-11-2018: Study Highlights   Nuclear stress EF: 70%.  The left ventricular ejection fraction is hyperdynamic (>65%).  There was no ST segment deviation noted during stress. Transient second degree and third degree AV block were noted during infusion.  The study is normal.  This is a low risk study.       EKG:  EKG is not ordered today.   Recent Labs: 03/09/2018: BUN 11; Creatinine, Ser 0.77; Hemoglobin 15.3; Platelets 182; Potassium 4.5; Sodium 138  Recent Lipid Panel    Component Value Date/Time   CHOL 138 06/03/2011 0640   TRIG 111 06/03/2011 0640   HDL 42 06/03/2011 0640   CHOLHDL 3.3 06/03/2011 0640   VLDL 22 06/03/2011 0640   LDLCALC 74 06/03/2011 0640    Physical Exam:    VS:  BP 130/72 (BP Location: Left Arm)   Pulse 84   Ht 5\' 11"  (1.803 m)   Wt 222 lb 12.8 oz (101.1 kg)   SpO2 96%   BMI 31.07 kg/m     Wt Readings from  Last 3 Encounters:  12/20/18 222 lb 12.8 oz (101.1 kg)  05/29/18 222 lb 12.8 oz (101.1 kg)  04/11/18 229 lb (103.9 kg)     GEN:  Well nourished, well developed in no acute distress HEENT: Normal NECK: No JVD; No carotid bruits LYMPHATICS: No lymphadenopathy CARDIAC: RRR, no murmurs, rubs, gallops RESPIRATORY:  Clear to auscultation without rales, wheezing or rhonchi  ABDOMEN: Soft, non-tender, non-distended MUSCULOSKELETAL:  No edema; No deformity  SKIN: Warm and dry NEUROLOGIC:  Alert and oriented x 3 PSYCHIATRIC:  Normal affect   ASSESSMENT:    1. Coronary artery disease involving native coronary artery of native heart with angina pectoris (HCC)   2. Mixed hyperlipidemia    PLAN:    In order of problems listed above:  1. Doing well without symptoms of angina. Continues on metoprolol and isosorbide for anti-anginal Rx, ASA for antiplatelet Rx, and a high intensity statin drug. I encouraged him to try to walk and do some light physical activities while cardiac rehab remains closed.  2. Treated with a combination of atorvastatin and zetia, labs followed by Dr Waynard Edwards.  Medication Adjustments/Labs and Tests Ordered: Current medicines are reviewed at length with the patient today.  Concerns regarding medicines are outlined above.  No orders of the defined types were placed in this encounter.  No orders of the defined types were placed in this encounter.   Patient Instructions  Medication Instructions:  Your provider recommends that you continue on your current medications as directed. Please refer to the Current Medication list given to you today.    Labwork: None  Testing/Procedures: None  Follow-Up: Your provider wants you to follow-up in: 6 months with Dr. Excell Seltzer. You will receive a reminder letter in the mail two months in advance. If you don't receive a letter, please call our office to schedule the follow-up appointment.  Signed, Tonny BollmanMichael Jestin Burbach, MD   12/20/2018 5:20 PM    [facility]

## 2018-12-21 ENCOUNTER — Telehealth (HOSPITAL_COMMUNITY): Payer: Self-pay | Admitting: *Deleted

## 2018-12-21 NOTE — Telephone Encounter (Signed)
PC to pt regarding status of cardiac rehab maintenance program.  The program remains on hold at this time. No answer. VM left.

## 2018-12-22 ENCOUNTER — Encounter (HOSPITAL_COMMUNITY): Payer: Self-pay

## 2018-12-25 ENCOUNTER — Encounter (HOSPITAL_COMMUNITY): Payer: Self-pay

## 2018-12-27 ENCOUNTER — Encounter (HOSPITAL_COMMUNITY): Payer: Self-pay

## 2019-01-01 ENCOUNTER — Encounter (HOSPITAL_COMMUNITY): Payer: Self-pay

## 2019-01-03 ENCOUNTER — Encounter (HOSPITAL_COMMUNITY): Payer: Self-pay

## 2019-01-05 ENCOUNTER — Encounter (HOSPITAL_COMMUNITY): Payer: Self-pay

## 2019-01-08 ENCOUNTER — Encounter (HOSPITAL_COMMUNITY): Payer: Self-pay

## 2019-01-10 ENCOUNTER — Encounter (HOSPITAL_COMMUNITY): Payer: Self-pay

## 2019-01-12 ENCOUNTER — Encounter (HOSPITAL_COMMUNITY): Payer: Self-pay

## 2019-01-15 ENCOUNTER — Encounter (HOSPITAL_COMMUNITY): Payer: Self-pay

## 2019-01-17 ENCOUNTER — Encounter (HOSPITAL_COMMUNITY): Payer: Self-pay

## 2019-01-19 ENCOUNTER — Encounter (HOSPITAL_COMMUNITY): Payer: Self-pay

## 2019-01-22 ENCOUNTER — Encounter (HOSPITAL_COMMUNITY): Payer: Self-pay

## 2019-01-24 ENCOUNTER — Encounter (HOSPITAL_COMMUNITY): Payer: Self-pay

## 2019-01-26 ENCOUNTER — Encounter (HOSPITAL_COMMUNITY): Payer: Self-pay

## 2019-01-29 ENCOUNTER — Encounter (HOSPITAL_COMMUNITY): Payer: Self-pay

## 2019-01-31 ENCOUNTER — Encounter (HOSPITAL_COMMUNITY): Payer: Self-pay

## 2019-02-02 ENCOUNTER — Encounter (HOSPITAL_COMMUNITY): Payer: Self-pay

## 2019-02-05 ENCOUNTER — Encounter (HOSPITAL_COMMUNITY): Payer: Self-pay

## 2019-02-07 ENCOUNTER — Encounter (HOSPITAL_COMMUNITY): Payer: Self-pay

## 2019-02-09 ENCOUNTER — Encounter (HOSPITAL_COMMUNITY): Payer: Self-pay

## 2019-02-12 ENCOUNTER — Encounter (HOSPITAL_COMMUNITY): Payer: Self-pay

## 2019-02-14 ENCOUNTER — Encounter (HOSPITAL_COMMUNITY): Payer: Self-pay

## 2019-02-16 ENCOUNTER — Encounter (HOSPITAL_COMMUNITY): Payer: Self-pay

## 2019-02-19 ENCOUNTER — Encounter (HOSPITAL_COMMUNITY): Payer: Self-pay

## 2019-02-20 ENCOUNTER — Telehealth: Payer: Self-pay | Admitting: Neurology

## 2019-02-20 MED ORDER — LEVETIRACETAM 750 MG PO TABS: 750.0000 mg | ORAL_TABLET | Freq: Two times a day (BID) | ORAL | 1 refills | Status: DC

## 2019-02-20 NOTE — Telephone Encounter (Signed)
Pt is asking for a call from RN to discuss why he is able to get a 90 day on his  TEGRETOL-XR 200 MG 12 hr tablet and only a 30 day on his  levETIRAcetam (KEPPRA) 750 MG tablet

## 2019-02-20 NOTE — Telephone Encounter (Signed)
Keppra RX changed to a 90 day supply. I called pt to advise him of this, no answer, left a detailed message per DPR advising him of this and asking him to call me back if he has any questions.

## 2019-02-20 NOTE — Addendum Note (Signed)
Addended by: Lester Granger A on: 02/20/2019 11:51 AM   Modules accepted: Orders

## 2019-02-21 ENCOUNTER — Other Ambulatory Visit: Payer: Self-pay | Admitting: Gastroenterology

## 2019-02-21 ENCOUNTER — Encounter (HOSPITAL_COMMUNITY): Payer: Self-pay

## 2019-02-23 ENCOUNTER — Encounter (HOSPITAL_COMMUNITY): Payer: Self-pay

## 2019-02-25 ENCOUNTER — Encounter

## 2019-02-26 ENCOUNTER — Encounter (HOSPITAL_COMMUNITY): Payer: Self-pay

## 2019-02-28 ENCOUNTER — Encounter (HOSPITAL_COMMUNITY): Payer: Self-pay

## 2019-03-02 ENCOUNTER — Encounter (HOSPITAL_COMMUNITY): Payer: Self-pay

## 2019-03-07 ENCOUNTER — Encounter (HOSPITAL_COMMUNITY): Payer: Self-pay

## 2019-03-09 ENCOUNTER — Encounter (HOSPITAL_COMMUNITY): Payer: Self-pay

## 2019-03-12 ENCOUNTER — Encounter (HOSPITAL_COMMUNITY): Payer: Self-pay

## 2019-03-14 ENCOUNTER — Encounter (HOSPITAL_COMMUNITY): Payer: Self-pay

## 2019-03-16 ENCOUNTER — Encounter (HOSPITAL_COMMUNITY): Payer: Self-pay

## 2019-03-19 ENCOUNTER — Encounter (HOSPITAL_COMMUNITY): Payer: Self-pay

## 2019-03-21 ENCOUNTER — Encounter (HOSPITAL_COMMUNITY): Payer: Self-pay

## 2019-03-23 ENCOUNTER — Encounter (HOSPITAL_COMMUNITY): Payer: Self-pay

## 2019-03-26 ENCOUNTER — Encounter (HOSPITAL_COMMUNITY): Payer: Self-pay

## 2019-03-28 ENCOUNTER — Encounter (HOSPITAL_COMMUNITY): Payer: Self-pay

## 2019-04-18 ENCOUNTER — Telehealth: Payer: Self-pay | Admitting: Cardiovascular Disease

## 2019-04-18 NOTE — Telephone Encounter (Signed)
Patient received letter for his December follow up with Dr. Burt Knack.  Offered him first available which is February 5th, he stated that was not acceptable, offered him sooner with one of the APP's, he refused.  He would like for you to give him a call.

## 2019-05-05 ENCOUNTER — Other Ambulatory Visit: Payer: Self-pay | Admitting: Cardiovascular Disease

## 2019-05-05 DIAGNOSIS — E785 Hyperlipidemia, unspecified: Secondary | ICD-10-CM

## 2019-05-05 DIAGNOSIS — I25719 Atherosclerosis of autologous vein coronary artery bypass graft(s) with unspecified angina pectoris: Secondary | ICD-10-CM

## 2019-05-05 DIAGNOSIS — I10 Essential (primary) hypertension: Secondary | ICD-10-CM

## 2019-05-16 ENCOUNTER — Other Ambulatory Visit: Payer: Self-pay

## 2019-05-16 DIAGNOSIS — Z20822 Contact with and (suspected) exposure to covid-19: Secondary | ICD-10-CM

## 2019-05-18 LAB — NOVEL CORONAVIRUS, NAA: SARS-CoV-2, NAA: NOT DETECTED

## 2019-05-20 ENCOUNTER — Other Ambulatory Visit: Payer: Self-pay | Admitting: Gastroenterology

## 2019-05-22 ENCOUNTER — Encounter: Payer: Self-pay | Admitting: Neurology

## 2019-05-22 ENCOUNTER — Other Ambulatory Visit: Payer: Self-pay

## 2019-05-22 ENCOUNTER — Ambulatory Visit (INDEPENDENT_AMBULATORY_CARE_PROVIDER_SITE_OTHER): Payer: Medicare Other | Admitting: Neurology

## 2019-05-22 VITALS — BP 144/85 | HR 100 | Ht 71.0 in | Wt 223.0 lb

## 2019-05-22 DIAGNOSIS — G40209 Localization-related (focal) (partial) symptomatic epilepsy and epileptic syndromes with complex partial seizures, not intractable, without status epilepticus: Secondary | ICD-10-CM | POA: Diagnosis not present

## 2019-05-22 DIAGNOSIS — Q8501 Neurofibromatosis, type 1: Secondary | ICD-10-CM
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
## 2019-05-28 NOTE — Telephone Encounter (Signed)
Scheduled patient 12/14 for 6 mo evaluation with Dr. Burt Knack. He was grateful for assistance.

## 2019-06-11 ENCOUNTER — Ambulatory Visit (INDEPENDENT_AMBULATORY_CARE_PROVIDER_SITE_OTHER): Payer: Medicare Other | Admitting: Cardiovascular Disease

## 2019-06-11 ENCOUNTER — Other Ambulatory Visit: Payer: Self-pay

## 2019-06-11 ENCOUNTER — Encounter: Payer: Self-pay | Admitting: Cardiovascular Disease

## 2019-06-11 VITALS — BP 114/62 | HR 74 | Ht 71.0 in | Wt 225.0 lb

## 2019-06-11 DIAGNOSIS — I25719 Atherosclerosis of autologous vein coronary artery bypass graft(s) with unspecified angina pectoris: Secondary | ICD-10-CM | POA: Diagnosis not present

## 2019-06-11 DIAGNOSIS — E782 Mixed hyperlipidemia: Secondary | ICD-10-CM

## 2019-06-11 DIAGNOSIS — I10 Essential (primary) hypertension: Secondary | ICD-10-CM

## 2019-06-11 DIAGNOSIS — I25119 Atherosclerotic heart disease of native coronary artery with unspecified angina pectoris: Secondary | ICD-10-CM

## 2019-06-11 MED ORDER — NITROGLYCERIN 0.4 MG SL SUBL: 0.4000 mg | SUBLINGUAL_TABLET | SUBLINGUAL | 5 refills | Status: DC | PRN

## 2019-06-11 NOTE — Patient Instructions (Signed)
Medication Instructions:  Your provider recommends that you continue on your current medications as directed. Please refer to the Current Medication list given to you today.   *If you need a refill on your cardiac medications before your next appointment, please call your pharmacy*   Follow-Up: At [facility], you and your health needs are our priority.  As part of our continuing mission to provide you with exceptional heart care, we have created designated Provider Care Teams.  These Care Teams include your primary Cardiologist (physician) and Advanced Practice Providers (APPs -  Physician Assistants and Nurse Practitioners) who all work together to provide you with the care you need, when you need it. Your next appointment:   6 month(s) The format for your next appointment:   In Person Provider:   You may see Michael Cooper, MD or one of the following Advanced Practice Providers on your designated Care Team:    Scott Weaver, PA-C  Vin Bhagat, PA-C  Janine Hammond, NP   

## 2019-06-11 NOTE — Progress Notes (Signed)
Cardiology Office Note:    Date:  06/11/2019   ID:  Jeff [name], DOB 04-Feb-1966, MRN [id number]  PCP:  Rodrigo Ran, MD  Cardiologist:  Tonny Bollman, MD  Electrophysiologist:  None   Referring MD: Rodrigo Ran, MD   Chief Complaint  Patient presents with  . Coronary Artery Disease   History of Present Illness:    Jeff [name] is a 53 y.o. male with a hx of coronary artery disease, presenting for follow-up evaluation.  The patient underwent multivessel CABG in 2011 complicated by early bypass graft failure. He underwent multivessel stenting shortly after bypass surgery. Most recent heart catheterization in 2014 demonstrated patency of his stent sites with a coronary disease pattern involving diffuse small vessel disease. Medical therapy was recommended. LV function has been normal.   He is here alone today. He reported an episode of what he call 'mild-moderate angina' about 3 weeks ago when his blood glucose became very elevated. He made some adjustments in his insulin, took a sublingual NTG, and symptoms improved. He later figured out that he had a problem with his insulin pump. He walks 1800 feet in his neighborhood periodically but he hasn't been exercising regularly since maintenance cardiac rehab has been closed due to the Covid 19 pandemic.  He denies any exertional chest pain or pressure with his walk.  Mild exertional dyspnea is unchanged.  No edema, orthopnea, or PND.  Generalized fatigue is a longstanding complaint with no significant change since his last visit here.  Past Medical History:  Diagnosis Date  . Anxiety   . CAD (coronary artery disease)    Cath 2011.  LAD stent patent, distal LAD occlusion, D1 100%, OM branch 80 - 90%, SVG to diag patent, LIMA to LAD occluded, SVG to RCA occluded, SVG to OM was occluded.   No change from previous cath.  Managed medically.   . Cataract   . Depression   . Diabetes mellitus type I (HCC)    on insulin pump at  home  .  Diabetic retinopathy    s/p vitrectomy and history of retinal surgery  . Hemorrhoid   . History of oxygen administration    nocutural use only at 2 l/m nasally.  [name] HTN (hypertension)   . Hyperlipidemia   . Myocardial infarction (HCC)   . 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 Seasonal allergic rhinitis   . Seizure disorder (HCC)   . Sleep apnea    s/p oral surgery  . Toe fracture, right    11-23-14 fracture right big toe-wearing a stability shoe    Past Surgical History:  Procedure Laterality Date  . APPENDECTOMY    . CARDIAC CATHETERIZATION    . CATARACT EXTRACTION, BILATERAL    . COLONOSCOPY WITH PROPOFOL N/A 07/21/2017   Procedure: COLONOSCOPY WITH PROPOFOL;  Surgeon: [name], MD;  Location: [facility];  Service: Endoscopy;  Laterality: N/A;  . CORONARY ANGIOPLASTY     9'11,  CABPG grafts had failed.  . CORONARY ARTERY BYPASS GRAFT     4 vessel 4/11  . ESOPHAGOGASTRODUODENOSCOPY (EGD) WITH PROPOFOL N/A 12/05/2014   Procedure: ESOPHAGOGASTRODUODENOSCOPY (EGD) WITH PROPOFOL;  Surgeon: [name], MD;  Location: [facility];  Service: Endoscopy;  Laterality: N/A;  . LAPAROSCOPIC APPENDECTOMY  11/24/2011  . LEFT HEART CATHETERIZATION WITH CORONARY/GRAFT ANGIOGRAM N/A 02/22/2013   Procedure: LEFT HEART CATHETERIZATION WITH [name];  Surgeon: Tonny Bollman, MD;  Location: [facility] CATH LAB;  Service: Cardiovascular;  Laterality: N/A;  . release of right transverse carpal ligament    . right eye vitrectomy and detached retina repair  10/2007  . TONSILLECTOMY    . uvulopalatoplasty surgery      Current Medications: Current Meds  Medication Sig  . acetaminophen (TYLENOL) 500 MG tablet Take 1,000 mg by mouth every 6 (six) hours as needed for mild pain.  [name] aspirin 81 MG tablet Take 81 mg by mouth daily.   . Cholecalciferol (VITAMIN D) 2000 units CAPS Take 2,000 Units by mouth daily.   [name] desvenlafaxine (PRISTIQ) 100 MG 24  hr tablet Take 100 mg by mouth daily.  [name] docusate sodium (COLACE) 100 MG capsule Take 100 mg by mouth at bedtime.   [name] ezetimibe (ZETIA) 10 MG tablet Take 10 mg by mouth at bedtime.   . insulin lispro (HUMALOG) 100 UNIT/ML injection Inject into the skin See admin instructions. Uses via Insulin Pump  . isosorbide mononitrate (IMDUR) 60 MG 24 hr tablet Take 60 mg by mouth every morning.   . levETIRAcetam (KEPPRA) 750 MG tablet Take 1 tablet (750 mg total) by mouth 2 (two) times daily.  [name] lisinopril (PRINIVIL,ZESTRIL) 5 MG tablet Take 5 mg by mouth every morning.   . metoprolol tartrate (LOPRESSOR) 25 MG tablet TAKE 1 TABLET(25 MG) BY MOUTH TWICE DAILY  . Multiple Vitamins-Minerals (CENTRUM SILVER 50+MEN) TABS Take 1 tablet by mouth every morning.  . nitroGLYCERIN (NITROSTAT) 0.4 MG SL tablet Place 1 tablet (0.4 mg total) under the tongue every 5 (five) minutes as needed for chest pain.  [name] omeprazole (PRILOSEC) 20 MG capsule TAKE 1 CAPSULE(20 MG) BY MOUTH DAILY. NEED OFFICE VISIT FOR FURTHER REFILLS.  [name] OXYGEN Place 2 L into the nose at bedtime.  . rosuvastatin (CRESTOR) 40 MG tablet Take 40 mg by mouth at bedtime.   . TEGRETOL-XR 200 MG 12 hr tablet TAKE 3 TABLETS BY MOUTH TWO TIMES A DAY.  . vitamin B-12 (CYANOCOBALAMIN) 500 MCG tablet Take 500 mcg by mouth daily.  . [DISCONTINUED] nitroGLYCERIN (NITROSTAT) 0.4 MG SL tablet PLACE 1 TABLET UNDER THE TONGUE EVERY 5 MINUTES AS NEEDED FOR CHEST PAIN     Allergies:   Latex   Social History   Socioeconomic History  . Marital status: Married    Spouse name: Almyra Free  . Number of children: 0  . Years of education: college  . Highest education level: Not on file  Occupational History  . Occupation: Unemployed    Comment: previously worked in International aid/development worker  . Smoking status: Never Smoker  . Smokeless tobacco: Never Used  Substance and Sexual Activity  . Alcohol use: No  . Drug use: No  . Sexual activity: Yes  Other Topics Concern  . Not  on file  Social History Narrative   Patient lives with his wifeLanora Manis).   Patient does not have any children.   Patient is on disability.   Patient is left-handed (writes), but right hand with sports.   Patient has a college education.   Patient drinks 5-6 cups of caffeine daily.   Social Determinants of Health   Financial Resource Strain:   . Difficulty of Paying Living Expenses: Not on file  Food Insecurity:   . Worried About Programme researcher, broadcasting/film/video in the Last Year: Not on file  . Ran Out of Food in the Last Year: Not on file  Transportation Needs:   . Lack of Transportation (Medical): Not on file  .  Lack of Transportation (Non-Medical): Not on file  Physical Activity:   . Days of Exercise per Week: Not on file  . Minutes of Exercise per Session: Not on file  Stress:   . Feeling of Stress : Not on file  Social Connections:   . Frequency of Communication with Friends and Family: Not on file  . Frequency of Social Gatherings with Friends and Family: Not on file  . Attends Religious Services: Not on file  . Active Member of Clubs or Organizations: Not on file  . Attends BankerClub or Organization Meetings: Not on file  . Marital Status: Not on file     Family History: The patient's family history includes Arthritis in his sister; Coronary artery disease (age of onset: 350) in his father; Crohn's disease in his mother; Diabetes in his father; Hearing loss in his mother; Hypertension in his father; Squamous cell carcinoma in his brother.  ROS:   Please see the history of present illness.    All other systems reviewed and are negative.  EKGs/Labs/Other Studies Reviewed:    The following studies were reviewed today: Myocardial perfusion scan 04/11/2018: Study Highlights   Nuclear stress EF: 70%.  The left ventricular ejection fraction is hyperdynamic (>65%).  There was no ST segment deviation noted during stress. Transient second degree and third degree AV block were noted  during infusion.  The study is normal.  This is a low risk study.   Echocardiogram 11/30/2016: Study Conclusions  - Left ventricle: The cavity size was normal. There was mild   concentric hypertrophy. Systolic function was vigorous. The   estimated ejection fraction was in the range of 65% to 70%. Wall   motion was normal; there were no regional wall motion   abnormalities. Doppler parameters are consistent with abnormal   left ventricular relaxation (grade 1 diastolic dysfunction). - Ventricular septum: Septal motion showed paradox. These changes   are consistent with a post-thoracotomy state. - Mitral valve: Calcified annulus. - Left atrium: The atrium was mildly dilated.  EKG:  EKG is ordered today.  The ekg ordered today demonstrates NSR with RBBB, unchanged from prior tracings, HR 74 bpm.   Recent Labs: No results found for requested labs within last 8760 hours.  Recent Lipid Panel    Component Value Date/Time   CHOL 138 06/03/2011 0640   TRIG 111 06/03/2011 0640   HDL 42 06/03/2011 0640   CHOLHDL 3.3 06/03/2011 0640   VLDL 22 06/03/2011 0640   LDLCALC 74 06/03/2011 0640    Physical Exam:    VS:  BP 114/62   Pulse 74   Ht 5\' 11"  (1.803 m)   Wt 225 lb (102.1 kg)   SpO2 96%   BMI 31.38 kg/m     Wt Readings from Last 3 Encounters:  06/11/19 225 lb (102.1 kg)  05/22/19 223 lb (101.2 kg)  12/20/18 222 lb 12.8 oz (101.1 kg)     GEN:  Well nourished, well developed in no acute distress HEENT: Normal NECK: No JVD; No carotid bruits LYMPHATICS: No lymphadenopathy CARDIAC: RRR, no murmurs, rubs, gallops RESPIRATORY:  Clear to auscultation without rales, wheezing or rhonchi  ABDOMEN: Soft, non-tender, non-distended MUSCULOSKELETAL:  No edema; No deformity  SKIN: Warm and dry NEUROLOGIC:  Alert and oriented x 3 PSYCHIATRIC:  Normal affect   ASSESSMENT:    1. Coronary artery disease involving native coronary artery of native heart with angina pectoris (HCC)     2. Mixed hyperlipidemia   3. 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 Essential hypertension    PLAN:    In order of problems listed above:  1. The patient has been on a stable medical regimen with isosorbide and metoprolol for antianginal therapy, low-dose aspirin, and a high intensity statin drug. He had an isolated episode of angina that may have been related to early DKA, and he hasn't had recurrent symptoms. Will follow for now. He understands to contact me if symptoms recur.  2. Treated with atorvastatin and ezetimibe.  Followed closely by his primary physician. Last lipids on file from 12/25/2018 show a cholesterol of 129, HDL 47, and LDL 66.    Medication Adjustments/Labs and Tests Ordered: Current medicines are reviewed at length with the patient today.  Concerns regarding medicines are outlined above.  Orders Placed This Encounter  Procedures  . EKG 12-Lead cs   Meds ordered this encounter  Medications  . nitroGLYCERIN (NITROSTAT) 0.4 MG SL tablet    Sig: Place 1 tablet (0.4 mg total) under the tongue every 5 (five) minutes as needed for chest pain.    Dispense:  25 tablet    Refill:  5    Patient Instructions  Medication Instructions:  Your provider recommends that you continue on your current medications as directed. Please refer to the Current Medication list given to you today.   *If you need a refill on your cardiac medications before your next appointment, please call your pharmacy*  Follow-Up: At [facility], you and your health needs are our priority.  As part of our continuing mission to provide you with exceptional heart care, we have created designated Provider Care Teams.  These Care Teams include your primary Cardiologist (physician) and Advanced Practice Providers (APPs -  Physician Assistants and Nurse Practitioners) who all work together to provide you with the care you need, when you need it. Your  next appointment:   6 month(s) The format for your next appointment:   In Person Provider:   You may see Sherren Mocha, MD or one of the following Advanced Practice Providers on your designated Care Team:    Richardson Dopp, PA-C  Vin Pecos, PA-C  Daune Perch, [place]    Signed, Sherren Mocha, MD  06/11/2019 1:20 PM    [place]

## 2019-07-31 ENCOUNTER — Other Ambulatory Visit: Payer: Self-pay

## 2019-07-31 MED ORDER — LEVETIRACETAM 750 MG PO TABS: 750.0000 mg | ORAL_TABLET | Freq: Two times a day (BID) | ORAL | 1 refills | Status: DC

## 2019-08-02 ENCOUNTER — Telehealth: Payer: Self-pay

## 2019-08-02 NOTE — Telephone Encounter (Signed)
PA for tegretol 200 mg XR has been approved 540/90 supply.   Authorization is good from 08/02/2019-06/27/2020.

## 2019-08-23 ENCOUNTER — Other Ambulatory Visit: Payer: Self-pay | Admitting: Gastroenterology

## 2019-09-23 ENCOUNTER — Other Ambulatory Visit: Payer: Self-pay | Admitting: Gastroenterology

## 2019-09-27 DIAGNOSIS — E1040 Type 1 diabetes mellitus with diabetic neuropathy, unspecified: Secondary | ICD-10-CM | POA: Diagnosis not present

## 2019-10-08 DIAGNOSIS — F429 Obsessive-compulsive disorder, unspecified: Secondary | ICD-10-CM | POA: Diagnosis not present

## 2019-10-16 ENCOUNTER — Other Ambulatory Visit: Payer: Self-pay

## 2019-10-16 ENCOUNTER — Telehealth: Payer: Self-pay | Admitting: Neurology

## 2019-10-16 MED ORDER — LEVETIRACETAM 750 MG PO TABS: 750.0000 mg | ORAL_TABLET | Freq: Two times a day (BID) | ORAL | 1 refills | Status: DC

## 2019-10-16 MED ORDER — LEVETIRACETAM 750 MG PO TABS: 750.0000 mg | ORAL_TABLET | Freq: Two times a day (BID) | ORAL | 0 refills | Status: DC

## 2019-10-16 NOTE — Telephone Encounter (Signed)
Patient requested a CB from RN to discuss his keppra

## 2019-10-16 NOTE — Telephone Encounter (Signed)
I call pts wife and she will have her husband pick up the keppra prescription tomorrow and he will use good rx. I stated it will be in a envelope. Pt verbalized understanding.

## 2019-10-16 NOTE — Telephone Encounter (Signed)
Prescription at front desk.

## 2019-10-16 NOTE — Telephone Encounter (Signed)
Dr.Sater did 90 pills for pt until he can get the refill on Nov 18, 2019 that insurance will pay for it. Pt will use good rx for this prescription.

## 2019-10-16 NOTE — Telephone Encounter (Signed)
PT call to state he cannot find his keppra prescription bottle he got in February. He has been taking it and has look everywhere. He called the pharmacy and they will not refill till Nov 18, 2019. If he pay out of the pocket it will be over 300 dollars. He ask if we can do a 45 day paper  prescription and he can use good rx and pay a cheaper price. I stated message will be sent to Dr. Epimenio Foot work in [place] because Dr.Willis is not in the office this week. HE verbalized understanding.

## 2019-10-22 ENCOUNTER — Telehealth: Payer: Self-pay | Admitting: Neurology

## 2019-10-22 NOTE — Telephone Encounter (Signed)
I have received blood work from the primary care physician done on 25 December 2018.  Carbamazepine level at that time was 8.0, glucose of 238, BUN of 9, creatinine 0.9, sodium 136, potassium 4.6, chloride 102, CO2 24, calcium 8.7, total protein 6.2, albumin 4.0, liver profile was unremarkable.  White blood count was 5.84, hemoglobin 16.3, hematocrit of 48.3, MCV of 92.4, platelets of 202, vitamin D level 36.7, hemoglobin A1c of 7.0.

## 2019-10-25 DIAGNOSIS — Z4681 Encounter for fitting and adjustment of insulin pump: Secondary | ICD-10-CM | POA: Diagnosis not present

## 2019-10-25 DIAGNOSIS — I10 Essential (primary) hypertension: Secondary | ICD-10-CM | POA: Diagnosis not present

## 2019-10-25 DIAGNOSIS — E1039 Type 1 diabetes mellitus with other diabetic ophthalmic complication: Secondary | ICD-10-CM | POA: Diagnosis not present

## 2019-10-25 DIAGNOSIS — Z794 Long term (current) use of insulin: Secondary | ICD-10-CM | POA: Diagnosis not present

## 2019-11-09 ENCOUNTER — Other Ambulatory Visit: Payer: Self-pay | Admitting: Cardiovascular Disease

## 2019-11-09 DIAGNOSIS — I10 Essential (primary) hypertension: Secondary | ICD-10-CM

## 2019-11-09 DIAGNOSIS — I25719 Atherosclerosis of autologous vein coronary artery bypass graft(s) with unspecified angina pectoris: Secondary | ICD-10-CM

## 2019-11-09 DIAGNOSIS — E785 Hyperlipidemia, unspecified: Secondary | ICD-10-CM

## 2019-11-12 DIAGNOSIS — F429 Obsessive-compulsive disorder, unspecified: Secondary | ICD-10-CM | POA: Diagnosis not present

## 2019-11-13 ENCOUNTER — Other Ambulatory Visit: Payer: Self-pay

## 2019-11-13 MED ORDER — TEGRETOL-XR 200 MG PO TB12: ORAL_TABLET | ORAL | 3 refills | Status: DC

## 2019-11-27 ENCOUNTER — Encounter: Payer: Self-pay | Admitting: Cardiovascular Disease

## 2019-11-27 NOTE — Telephone Encounter (Signed)
error 

## 2019-12-05 DIAGNOSIS — E1040 Type 1 diabetes mellitus with diabetic neuropathy, unspecified: Secondary | ICD-10-CM | POA: Diagnosis not present

## 2019-12-06 DIAGNOSIS — E1051 Type 1 diabetes mellitus with diabetic peripheral angiopathy without gangrene: Secondary | ICD-10-CM | POA: Diagnosis not present

## 2019-12-06 DIAGNOSIS — G4700 Insomnia, unspecified: Secondary | ICD-10-CM | POA: Diagnosis not present

## 2019-12-06 DIAGNOSIS — E1039 Type 1 diabetes mellitus with other diabetic ophthalmic complication: Secondary | ICD-10-CM | POA: Diagnosis not present

## 2019-12-06 DIAGNOSIS — H9203 Otalgia, bilateral: Secondary | ICD-10-CM | POA: Diagnosis not present

## 2019-12-06 DIAGNOSIS — H6123 Impacted cerumen, bilateral: Secondary | ICD-10-CM | POA: Diagnosis not present

## 2019-12-10 DIAGNOSIS — F429 Obsessive-compulsive disorder, unspecified: Secondary | ICD-10-CM | POA: Diagnosis not present

## 2019-12-14 DIAGNOSIS — I10 Essential (primary) hypertension: Secondary | ICD-10-CM | POA: Diagnosis not present

## 2019-12-14 DIAGNOSIS — E1039 Type 1 diabetes mellitus with other diabetic ophthalmic complication: Secondary | ICD-10-CM | POA: Diagnosis not present

## 2019-12-14 DIAGNOSIS — Z4681 Encounter for fitting and adjustment of insulin pump: Secondary | ICD-10-CM | POA: Diagnosis not present

## 2019-12-14 DIAGNOSIS — Z794 Long term (current) use of insulin: Secondary | ICD-10-CM | POA: Diagnosis not present

## 2019-12-21 ENCOUNTER — Other Ambulatory Visit: Payer: Self-pay

## 2019-12-21 ENCOUNTER — Encounter: Payer: Self-pay | Admitting: Cardiovascular Disease

## 2019-12-21 ENCOUNTER — Ambulatory Visit: Payer: Medicare Other | Admitting: Cardiovascular Disease

## 2019-12-21 VITALS — BP 120/76 | HR 82 | Ht 71.0 in | Wt 229.8 lb

## 2019-12-21 DIAGNOSIS — E782 Mixed hyperlipidemia: Secondary | ICD-10-CM | POA: Diagnosis not present

## 2019-12-21 DIAGNOSIS — I10 Essential (primary) hypertension: Secondary | ICD-10-CM

## 2019-12-21 DIAGNOSIS — I25119 Atherosclerotic heart disease of native coronary artery with unspecified angina pectoris: Secondary | ICD-10-CM | POA: Diagnosis not present

## 2019-12-21 LAB — TROPONIN I (HIGH SENSITIVITY): Troponin I (High Sensitivity): 5 ng/L (ref <18)

## 2019-12-21 NOTE — Patient Instructions (Addendum)
Medication Instructions:  Your provider recommends that you continue on your current medications as directed. Please refer to the Current Medication list given to you today.   *If you need a refill on your cardiac medications before your next appointment, please call your pharmacy*  Lab Work: TODAY! Troponin If you have labs (blood work) drawn today and your tests are completely normal, you will receive your results only by: [name] MyChart Message (if you have MyChart) OR . A paper copy in the mail If you have any lab test that is abnormal or we need to change your treatment, we will call you to review the results.  Follow-Up: You have an appointment with Dr. Excell Seltzer on 06/13/2020 at 9:40AM.

## 2019-12-21 NOTE — Progress Notes (Signed)
Cardiology Office Note:    Date:  12/21/2019   ID:  Jeff [name], DOB Jun 26, 1966, MRN [id number]  PCP:  Crist Infante, MD  [facility] Cardiologist:  Sherren Mocha, MD  [place] Electrophysiologist:  None   Referring MD: Crist Infante, MD   Chief Complaint  Patient presents with  . Coronary Artery Disease    History of Present Illness:    Jeff [name] is a 54 y.o. male with a hx of coronary artery disease, presenting for follow-up evaluation.  The patient underwent multivessel CABG in 8185 complicated by early bypass graft failure. He underwent multivessel stenting shortly after bypass surgery. Most recent heart catheterization in 2014 demonstrated patency of his stent sites with a coronary disease pattern involving diffuse small vessel disease. Medical therapy was recommended. LV function has been normal.   The patient is here alone today. He's been under a lot of stress. His mom passed away last weekend. He has his annual exam with Dr Joylene Draft on July 13th. He has a mild burning sensation in his left sternal area that he calls 'attention-getting' and rates the discomfort at 2/10. He thinks this might be stress related. This is unchanged with walking or exertion.  Past Medical History:  Diagnosis Date  . Anxiety   . CAD (coronary artery disease)    Cath 2011.  LAD stent patent, distal LAD occlusion, D1 100%, OM branch 80 - 90%, SVG to diag patent, LIMA to LAD occluded, SVG to RCA occluded, SVG to OM was occluded.   No change from previous cath.  Managed medically.   . Cataract   . Depression   . Diabetes mellitus type I ([place])    on insulin pump at  home  . Diabetic retinopathy    s/p vitrectomy and history of retinal surgery  . Hemorrhoid   . History of oxygen administration    nocutural use only at 2 l/m nasally.  [name] HTN (hypertension)   . Hyperlipidemia   . Myocardial infarction ([place])   . 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 Seasonal allergic rhinitis   . Seizure disorder ([place])   . Sleep apnea    s/p oral surgery  . Toe fracture, right    11-23-14 fracture right big toe-wearing a stability shoe    Past Surgical History:  Procedure Laterality Date  . APPENDECTOMY    . CARDIAC CATHETERIZATION    . CATARACT EXTRACTION, BILATERAL    . COLONOSCOPY WITH PROPOFOL N/A 07/21/2017   Procedure: COLONOSCOPY WITH PROPOFOL;  Surgeon: Milus Banister, MD;  Location: [facility];  Service: Endoscopy;  Laterality: N/A;  . CORONARY ANGIOPLASTY     9'11,  CABPG grafts had failed.  . CORONARY ARTERY BYPASS GRAFT     4 vessel 4/11  . ESOPHAGOGASTRODUODENOSCOPY (EGD) WITH PROPOFOL N/A 12/05/2014   Procedure: ESOPHAGOGASTRODUODENOSCOPY (EGD) WITH PROPOFOL;  Surgeon: Milus Banister, MD;  Location: [facility];  Service: Endoscopy;  Laterality: N/A;  . LAPAROSCOPIC APPENDECTOMY  11/24/2011  . LEFT HEART CATHETERIZATION WITH CORONARY/GRAFT ANGIOGRAM N/A 02/22/2013   Procedure: LEFT HEART CATHETERIZATION WITH Beatrix Fetters;  Surgeon: Sherren Mocha, MD;  Location: [facility] CATH LAB;  Service: Cardiovascular;  Laterality: N/A;  . release of right transverse carpal ligament    . right eye vitrectomy and detached retina repair  10/2007  . TONSILLECTOMY    . uvulopalatoplasty surgery      Current Medications: Current Meds  Medication Sig  .  ACCU-CHEK GUIDE test strip   . Accu-Chek Softclix Lancets lancets   . acetaminophen (TYLENOL) 500 MG tablet Take 1,000 mg by mouth every 6 (six) hours as needed for mild pain.  [name] aspirin 81 MG tablet Take 81 mg by mouth daily.   . Blood Glucose Monitoring Suppl (ACCU-CHEK GUIDE) w/Device KIT   . Cholecalciferol (VITAMIN D) 2000 units CAPS Take 2,000 Units by mouth daily.   [name] desvenlafaxine (PRISTIQ) 100 MG 24 hr tablet Take 100 mg by mouth daily.  [name] docusate sodium (COLACE) 100 MG capsule Take 100 mg by mouth at bedtime.   [name] ezetimibe (ZETIA) 10 MG tablet Take 10 mg by mouth at  bedtime.   . isosorbide mononitrate (IMDUR) 60 MG 24 hr tablet Take 60 mg by mouth every morning.   . levETIRAcetam (KEPPRA) 750 MG tablet Take 1 tablet (750 mg total) by mouth 2 (two) times daily.  [name] lisinopril (PRINIVIL,ZESTRIL) 5 MG tablet Take 5 mg by mouth every morning.   . metoprolol tartrate (LOPRESSOR) 25 MG tablet TAKE 1 TABLET(25 MG) BY MOUTH TWICE DAILY  . Multiple Vitamins-Minerals (CENTRUM SILVER 50+MEN) TABS Take 1 tablet by mouth every morning.  . nitroGLYCERIN (NITROSTAT) 0.4 MG SL tablet Place 1 tablet (0.4 mg total) under the tongue every 5 (five) minutes as needed for chest pain.  [name] NOVOLOG 100 UNIT/ML injection   . omeprazole (PRILOSEC) 20 MG capsule TAKE 1 CAPSULE(20 MG) BY MOUTH DAILY. NEED OFFICE VISIT FOR FURTHER REFILLS.  [name] OXYGEN Place 2 L into the nose at bedtime.  . rosuvastatin (CRESTOR) 40 MG tablet Take 40 mg by mouth at bedtime.   . TEGRETOL-XR 200 MG 12 hr tablet TAKE 3 TABLETS BY MOUTH TWO TIMES A DAY.  . vitamin B-12 (CYANOCOBALAMIN) 500 MCG tablet Take 500 mcg by mouth daily.     Allergies:   Latex   Social History   Socioeconomic History  . Marital status: Married    Spouse name: [name]  . Number of children: 0  . Years of education: college  . Highest education level: Not on file  Occupational History  . 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 Smoking status: Never Smoker  . Smokeless tobacco: Never Used  Vaping Use  . Vaping Use: Never used  Substance and Sexual Activity  . Alcohol use: No  . Drug use: No  . Sexual activity: Yes  Other Topics Concern  . Not on file  Social History Narrative   Patient lives with his wifeBenjamine Mola).   Patient does not have any children.   Patient is on disability.   Patient is left-handed (writes), but right hand with sports.   Patient has a college education.   Patient drinks 5-6 cups of caffeine daily.   Social Determinants of Health   Financial Resource  Strain:   . Difficulty of Paying Living Expenses:   Food Insecurity:   . Worried About Charity fundraiser in the Last Year:   . Arboriculturist in the Last Year:   Transportation Needs:   . Film/video editor (Medical):   [name] Lack of Transportation (Non-Medical):   Physical Activity:   . Days of Exercise per Week:   . Minutes of Exercise per Session:   Stress:   . Feeling of Stress :   Social Connections:   . Frequency of Communication with Friends and Family:   . Frequency of Social Gatherings with Friends and  Family:   . Attends Religious Services:   . Active Member of Clubs or Organizations:   . Attends Archivist Meetings:   [name] Marital Status:      Family History: The patient's family history includes Arthritis in his sister; Coronary artery disease (age of onset: 61) in his father; Crohn's disease in his mother; Diabetes in his father; Hearing loss in his mother; Hypertension in his father; Squamous cell carcinoma in his brother.  ROS:   Please see the history of present illness.    All other systems reviewed and are negative.  EKGs/Labs/Other Studies Reviewed:    The following studies were reviewed today: Echo 11/30/2016: Study Conclusions   - Left ventricle: The cavity size was normal. There was mild  concentric hypertrophy. Systolic function was vigorous. The  estimated ejection fraction was in the range of 65% to 70%. Wall  motion was normal; there were no regional wall motion  abnormalities. Doppler parameters are consistent with abnormal  left ventricular relaxation (grade 1 diastolic dysfunction).  - Ventricular septum: Septal motion showed paradox. These changes  are consistent with a post-thoracotomy state.  - Mitral valve: Calcified annulus.  - Left atrium: The atrium was mildly dilated.   EKG:  EKG is ordered today.  The ekg ordered today demonstrates normal sinus rhythm, right bundle branch block, heart rate 74 bpm, mild inferior ST  elevation unchanged from previous tracings.  Recent Labs: No results found for requested labs within last 8760 hours.  Recent Lipid Panel    Component Value Date/Time   CHOL 138 06/03/2011 0640   TRIG 111 06/03/2011 0640   HDL 42 06/03/2011 0640   CHOLHDL 3.3 06/03/2011 0640   VLDL 22 06/03/2011 0640   LDLCALC 74 06/03/2011 0640    Physical Exam:    VS:  BP 120/76   Pulse 82   Ht '5\' 11"'  (1.803 m)   Wt 229 lb 12.8 oz (104.2 kg)   SpO2 96%   BMI 32.05 kg/m     Wt Readings from Last 3 Encounters:  12/21/19 229 lb 12.8 oz (104.2 kg)  06/11/19 225 lb (102.1 kg)  05/22/19 223 lb (101.2 kg)     GEN:  Well nourished, well developed in no acute distress HEENT: Normal NECK: No JVD; No carotid bruits LYMPHATICS: No lymphadenopathy CARDIAC: RRR, no murmurs, rubs, gallops RESPIRATORY:  Clear to auscultation without rales, wheezing or rhonchi  ABDOMEN: Soft, non-tender, non-distended MUSCULOSKELETAL:  No edema; No deformity  SKIN: Warm and dry NEUROLOGIC:  Alert and oriented x 3 PSYCHIATRIC:  Normal affect   ASSESSMENT:    1. Coronary artery disease involving native coronary artery of native heart with angina pectoris ([place])   2. Mixed hyperlipidemia   3. Essential hypertension    PLAN:    In order of problems listed above:  1. Suspect his chest discomfort is stress related.  It appears to be very mild and is unrelated to physical exertion.  An EKG is checked and showed no changes compared to his previous tracings.  We will check an office troponin just to make sure since he does have significant underlying CAD. 2. Treated with Zetia and rosuvastatin 40 mg daily annual physical exam coming up next month. 3. Blood pressure remains well controlled.  He was concerned that his blood pressure had been increasing but again this seems to be stress related and the highest systolic readings he has had even with the death of his mother have been in the 140  mmHg range.  I did not  recommend any changes today.  Medication Adjustments/Labs and Tests Ordered: Current medicines are reviewed at length with the patient today.  Concerns regarding medicines are outlined above.  Orders Placed This Encounter  Procedures  . EKG 12-Lead   No orders of the defined types were placed in this encounter.   Patient Instructions  Medication Instructions:  Your provider recommends that you continue on your current medications as directed. Please refer to the Current Medication list given to you today.   *If you need a refill on your cardiac medications before your next appointment, please call your pharmacy*  Lab Work: TODAY! Troponin If you have labs (blood work) drawn today and your tests are completely normal, you will receive your results only by: [name] MyChart Message (if you have MyChart) OR . A paper copy in the mail If you have any lab test that is abnormal or we need to change your treatment, we will call you to review the results.  Follow-Up: You have an appointment with Dr. Burt Knack on 06/13/2020 at 9:40AM.    Signed, Sherren Mocha, MD  12/21/2019 1:01 PM    [place]

## 2020-01-03 DIAGNOSIS — E1051 Type 1 diabetes mellitus with diabetic peripheral angiopathy without gangrene: Secondary | ICD-10-CM | POA: Diagnosis not present

## 2020-01-03 DIAGNOSIS — Z0000 Encounter for general adult medical examination without abnormal findings: Secondary | ICD-10-CM | POA: Diagnosis not present

## 2020-01-03 DIAGNOSIS — M859 Disorder of bone density and structure, unspecified: Secondary | ICD-10-CM | POA: Diagnosis not present

## 2020-01-03 DIAGNOSIS — Z125 Encounter for screening for malignant neoplasm of prostate: Secondary | ICD-10-CM | POA: Diagnosis not present

## 2020-01-03 DIAGNOSIS — F429 Obsessive-compulsive disorder, unspecified: Secondary | ICD-10-CM | POA: Diagnosis not present

## 2020-01-03 DIAGNOSIS — E7849 Other hyperlipidemia: Secondary | ICD-10-CM | POA: Diagnosis not present

## 2020-01-03 DIAGNOSIS — E1039 Type 1 diabetes mellitus with other diabetic ophthalmic complication: Secondary | ICD-10-CM | POA: Diagnosis not present

## 2020-01-03 DIAGNOSIS — E291 Testicular hypofunction: Secondary | ICD-10-CM | POA: Diagnosis not present

## 2020-01-04 DIAGNOSIS — E1040 Type 1 diabetes mellitus with diabetic neuropathy, unspecified: Secondary | ICD-10-CM | POA: Diagnosis not present

## 2020-01-08 DIAGNOSIS — F339 Major depressive disorder, recurrent, unspecified: Secondary | ICD-10-CM | POA: Diagnosis not present

## 2020-01-08 DIAGNOSIS — N3281 Overactive bladder: Secondary | ICD-10-CM | POA: Diagnosis not present

## 2020-01-08 DIAGNOSIS — I209 Angina pectoris, unspecified: Secondary | ICD-10-CM | POA: Diagnosis not present

## 2020-01-08 DIAGNOSIS — M8580 Other specified disorders of bone density and structure, unspecified site: Secondary | ICD-10-CM | POA: Diagnosis not present

## 2020-01-08 DIAGNOSIS — Z1331 Encounter for screening for depression: Secondary | ICD-10-CM | POA: Diagnosis not present

## 2020-01-08 DIAGNOSIS — Z0000 Encounter for general adult medical examination without abnormal findings: Secondary | ICD-10-CM | POA: Diagnosis not present

## 2020-01-08 DIAGNOSIS — R82998 Other abnormal findings in urine: Secondary | ICD-10-CM | POA: Diagnosis not present

## 2020-01-08 DIAGNOSIS — Z1212 Encounter for screening for malignant neoplasm of rectum: Secondary | ICD-10-CM | POA: Diagnosis not present

## 2020-01-08 DIAGNOSIS — I2510 Atherosclerotic heart disease of native coronary artery without angina pectoris: Secondary | ICD-10-CM | POA: Diagnosis not present

## 2020-01-08 DIAGNOSIS — N401 Enlarged prostate with lower urinary tract symptoms: Secondary | ICD-10-CM | POA: Diagnosis not present

## 2020-01-08 DIAGNOSIS — E1039 Type 1 diabetes mellitus with other diabetic ophthalmic complication: Secondary | ICD-10-CM | POA: Diagnosis not present

## 2020-01-08 DIAGNOSIS — Z1339 Encounter for screening examination for other mental health and behavioral disorders: Secondary | ICD-10-CM | POA: Diagnosis not present

## 2020-01-08 DIAGNOSIS — E1051 Type 1 diabetes mellitus with diabetic peripheral angiopathy without gangrene: Secondary | ICD-10-CM | POA: Diagnosis not present

## 2020-01-08 DIAGNOSIS — I10 Essential (primary) hypertension: Secondary | ICD-10-CM | POA: Diagnosis not present

## 2020-01-23 DIAGNOSIS — R5383 Other fatigue: Secondary | ICD-10-CM | POA: Diagnosis not present

## 2020-02-04 DIAGNOSIS — E1040 Type 1 diabetes mellitus with diabetic neuropathy, unspecified: Secondary | ICD-10-CM | POA: Diagnosis not present

## 2020-02-06 ENCOUNTER — Ambulatory Visit: Payer: Medicare Other | Admitting: Cardiovascular Disease

## 2020-02-15 ENCOUNTER — Other Ambulatory Visit: Payer: Self-pay | Admitting: Neurology

## 2020-02-19 DIAGNOSIS — Z4681 Encounter for fitting and adjustment of insulin pump: Secondary | ICD-10-CM | POA: Diagnosis not present

## 2020-02-19 DIAGNOSIS — I10 Essential (primary) hypertension: Secondary | ICD-10-CM | POA: Diagnosis not present

## 2020-02-19 DIAGNOSIS — Z794 Long term (current) use of insulin: Secondary | ICD-10-CM | POA: Diagnosis not present

## 2020-02-19 DIAGNOSIS — E785 Hyperlipidemia, unspecified: Secondary | ICD-10-CM | POA: Diagnosis not present

## 2020-02-19 DIAGNOSIS — E1039 Type 1 diabetes mellitus with other diabetic ophthalmic complication: Secondary | ICD-10-CM | POA: Diagnosis not present

## 2020-02-29 DIAGNOSIS — F429 Obsessive-compulsive disorder, unspecified: Secondary | ICD-10-CM | POA: Diagnosis not present

## 2020-03-06 DIAGNOSIS — E1040 Type 1 diabetes mellitus with diabetic neuropathy, unspecified: Secondary | ICD-10-CM | POA: Diagnosis not present

## 2020-04-05 DIAGNOSIS — E1040 Type 1 diabetes mellitus with diabetic neuropathy, unspecified: Secondary | ICD-10-CM | POA: Diagnosis not present

## 2020-04-08 DIAGNOSIS — I10 Essential (primary) hypertension: Secondary | ICD-10-CM | POA: Diagnosis not present

## 2020-04-08 DIAGNOSIS — R569 Unspecified convulsions: Secondary | ICD-10-CM | POA: Diagnosis not present

## 2020-04-08 DIAGNOSIS — E1039 Type 1 diabetes mellitus with other diabetic ophthalmic complication: Secondary | ICD-10-CM | POA: Diagnosis not present

## 2020-04-08 DIAGNOSIS — E1051 Type 1 diabetes mellitus with diabetic peripheral angiopathy without gangrene: Secondary | ICD-10-CM | POA: Diagnosis not present

## 2020-04-11 DIAGNOSIS — F429 Obsessive-compulsive disorder, unspecified: Secondary | ICD-10-CM | POA: Diagnosis not present

## 2020-04-16 DIAGNOSIS — Z23 Encounter for immunization: Secondary | ICD-10-CM | POA: Diagnosis not present

## 2020-04-16 DIAGNOSIS — H524 Presbyopia: Secondary | ICD-10-CM | POA: Diagnosis not present

## 2020-05-05 ENCOUNTER — Other Ambulatory Visit: Payer: Self-pay | Admitting: Cardiovascular Disease

## 2020-05-05 DIAGNOSIS — E785 Hyperlipidemia, unspecified: Secondary | ICD-10-CM

## 2020-05-05 DIAGNOSIS — I25719 Atherosclerosis of autologous vein coronary artery bypass graft(s) with unspecified angina pectoris: Secondary | ICD-10-CM

## 2020-05-05 DIAGNOSIS — I10 Essential (primary) hypertension: Secondary | ICD-10-CM

## 2020-05-06 DIAGNOSIS — E1040 Type 1 diabetes mellitus with diabetic neuropathy, unspecified: Secondary | ICD-10-CM | POA: Diagnosis not present

## 2020-05-13 DIAGNOSIS — F339 Major depressive disorder, recurrent, unspecified: Secondary | ICD-10-CM | POA: Diagnosis not present

## 2020-05-13 DIAGNOSIS — Q8500 Neurofibromatosis, unspecified: Secondary | ICD-10-CM | POA: Diagnosis not present

## 2020-05-13 DIAGNOSIS — G4733 Obstructive sleep apnea (adult) (pediatric): Secondary | ICD-10-CM | POA: Diagnosis not present

## 2020-05-13 DIAGNOSIS — E1040 Type 1 diabetes mellitus with diabetic neuropathy, unspecified: Secondary | ICD-10-CM | POA: Diagnosis not present

## 2020-05-13 DIAGNOSIS — I209 Angina pectoris, unspecified: Secondary | ICD-10-CM | POA: Diagnosis not present

## 2020-05-13 DIAGNOSIS — I10 Essential (primary) hypertension: Secondary | ICD-10-CM | POA: Diagnosis not present

## 2020-05-13 DIAGNOSIS — I2510 Atherosclerotic heart disease of native coronary artery without angina pectoris: Secondary | ICD-10-CM | POA: Diagnosis not present

## 2020-05-13 DIAGNOSIS — E785 Hyperlipidemia, unspecified: Secondary | ICD-10-CM | POA: Diagnosis not present

## 2020-05-13 DIAGNOSIS — E1039 Type 1 diabetes mellitus with other diabetic ophthalmic complication: Secondary | ICD-10-CM | POA: Diagnosis not present

## 2020-05-21 DIAGNOSIS — E1039 Type 1 diabetes mellitus with other diabetic ophthalmic complication: Secondary | ICD-10-CM | POA: Diagnosis not present

## 2020-05-21 DIAGNOSIS — I10 Essential (primary) hypertension: Secondary | ICD-10-CM | POA: Diagnosis not present

## 2020-05-21 DIAGNOSIS — Z794 Long term (current) use of insulin: Secondary | ICD-10-CM | POA: Diagnosis not present

## 2020-05-21 DIAGNOSIS — Z4681 Encounter for fitting and adjustment of insulin pump: Secondary | ICD-10-CM | POA: Diagnosis not present

## 2020-05-27 ENCOUNTER — Encounter: Payer: Self-pay | Admitting: Neurology

## 2020-05-27 ENCOUNTER — Ambulatory Visit: Payer: Medicare PPO | Admitting: Neurology

## 2020-05-27 VITALS — BP 123/75 | HR 86 | Ht 71.0 in | Wt 232.4 lb

## 2020-05-27 DIAGNOSIS — Q8501 Neurofibromatosis, type 1: Secondary | ICD-10-CM

## 2020-05-27 DIAGNOSIS — G40209 Localization-related (focal) (partial) symptomatic epilepsy and epileptic syndromes with complex partial seizures, not intractable, without status epilepticus: Secondary | ICD-10-CM | POA: Diagnosis not present

## 2020-05-27 MED ORDER — TEGRETOL-XR 200 MG PO TB12: ORAL_TABLET | ORAL | 3 refills | Status: DC

## 2020-05-27 MED ORDER — LEVETIRACETAM 750 MG PO TABS: ORAL_TABLET | ORAL | 3 refills | Status: DC

## 2020-05-27 NOTE — Patient Instructions (Signed)
Continue current medications  Call for seizure activity  See you back in 1 year  
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
## 2020-05-27 NOTE — Progress Notes (Signed)
I have read the note, and I agree with the clinical assessment and plan.  Camren Lipsett [employer] [facility] [place]   

## 2020-06-05 DIAGNOSIS — E1040 Type 1 diabetes mellitus with diabetic neuropathy, unspecified: Secondary | ICD-10-CM | POA: Diagnosis not present

## 2020-06-13 ENCOUNTER — Other Ambulatory Visit: Payer: Self-pay

## 2020-06-13 ENCOUNTER — Encounter: Payer: Self-pay | Admitting: Cardiovascular Disease

## 2020-06-13 ENCOUNTER — Ambulatory Visit: Payer: Medicare PPO | Admitting: Cardiovascular Disease

## 2020-06-13 VITALS — BP 132/70 | HR 92 | Ht 71.0 in | Wt 233.6 lb

## 2020-06-13 DIAGNOSIS — E1069 Type 1 diabetes mellitus with other specified complication: Secondary | ICD-10-CM | POA: Diagnosis not present

## 2020-06-13 DIAGNOSIS — I25119 Atherosclerotic heart disease of native coronary artery with unspecified angina pectoris: Secondary | ICD-10-CM | POA: Diagnosis not present

## 2020-06-13 DIAGNOSIS — I10 Essential (primary) hypertension: Secondary | ICD-10-CM

## 2020-06-13 DIAGNOSIS — F429 Obsessive-compulsive disorder, unspecified: Secondary | ICD-10-CM | POA: Diagnosis not present

## 2020-06-13 DIAGNOSIS — E782 Mixed hyperlipidemia: Secondary | ICD-10-CM

## 2020-06-13 NOTE — Patient Instructions (Signed)

## 2020-06-13 NOTE — Progress Notes (Signed)
Cardiology Office Note:    Date:  06/13/2020   ID:  Jeff [name], DOB [birth year], MRN [id number]  PCP:  Crist Infante, MD  [facility] Cardiologist:  Sherren Mocha, MD  [place] Electrophysiologist:  None   Referring MD: Crist Infante, MD   Chief Complaint  Patient presents with  . Coronary Artery Disease    History of Present Illness:    Jeff [name] is a 53 y.o. male with a hx of  coronary artery disease, presenting for follow-up evaluation.The patient underwent multivessel CABG in 7026 complicated by early bypass graft failure. He underwent multivessel stenting shortly after bypass surgery. Most recent heart catheterization in 2014 demonstrated patency of his stent sites with a coronary disease pattern involving diffuse small vessel disease. Medical therapy was recommended. LV function has been normal.  He is here alone today. Labs from Dr Perini's wellness exam in July 2021 with labs showing a cholesterol of 137, HDL 47, LDL 72, and triglycerides 92. He is frustrated with dealing with the DMV and he's dealing with issues around his driver's license and medical problems. He has no cardiac concerns today. Today, he denies symptoms of palpitations, chest pain, orthopnea, PND, lower extremity edema, dizziness, or syncope. He had shortness of breath recently when he was carrying luggage into a hotel, but symptoms resolved quickly with rest. He did not experience any chest pain with this.    Past Medical History:  Diagnosis Date  . Anxiety   . CAD (coronary artery disease)    Cath 2011.  LAD stent patent, distal LAD occlusion, D1 100%, OM branch 80 - 90%, SVG to diag patent, LIMA to LAD occluded, SVG to RCA occluded, SVG to OM was occluded.   No change from previous cath.  Managed medically.   . Cataract   . Depression   . Diabetes mellitus type I ([place])    on insulin pump at  home  . Diabetic retinopathy    s/p vitrectomy and history of retinal surgery  .  Hemorrhoid   . History of oxygen administration    nocutural use only at 2 l/m nasally.  [name] HTN (hypertension)   . Hyperlipidemia   . Myocardial infarction ([place])   . 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 Seasonal allergic rhinitis   . Seizure disorder ([place])   . Sleep apnea    s/p oral surgery  . Toe fracture, right    11-23-14 fracture right big toe-wearing a stability shoe    Past Surgical History:  Procedure Laterality Date  . APPENDECTOMY    . CARDIAC CATHETERIZATION    . CATARACT EXTRACTION, BILATERAL    . COLONOSCOPY WITH PROPOFOL N/A 07/21/2017   Procedure: COLONOSCOPY WITH PROPOFOL;  Surgeon: Milus Banister, MD;  Location: [facility] ENDOSCOPY;  Service: Endoscopy;  Laterality: N/A;  . CORONARY ANGIOPLASTY     9'11,  CABPG grafts had failed.  . CORONARY ARTERY BYPASS GRAFT     4 vessel 4/11  . ESOPHAGOGASTRODUODENOSCOPY (EGD) WITH PROPOFOL N/A 12/05/2014   Procedure: ESOPHAGOGASTRODUODENOSCOPY (EGD) WITH PROPOFOL;  Surgeon: Milus Banister, MD;  Location: [facility] ENDOSCOPY;  Service: Endoscopy;  Laterality: N/A;  . LAPAROSCOPIC APPENDECTOMY  11/24/2011  . LEFT HEART CATHETERIZATION WITH CORONARY/GRAFT ANGIOGRAM N/A 02/22/2013   Procedure: LEFT HEART CATHETERIZATION WITH Beatrix Fetters;  Surgeon: Sherren Mocha, MD;  Location: [facility] CATH LAB;  Service: Cardiovascular;  Laterality: N/A;  . release of right transverse carpal ligament    .  right eye vitrectomy and detached retina repair  10/2007  . TONSILLECTOMY    . uvulopalatoplasty surgery      Current Medications: Current Meds  Medication Sig  . ACCU-CHEK GUIDE test strip   . Accu-Chek Softclix Lancets lancets   . acetaminophen (TYLENOL) 500 MG tablet Take 1,000 mg by mouth every 6 (six) hours as needed for mild pain.  . Alirocumab (PRALUENT) 150 MG/ML SOAJ Inject into the skin. Per patient taking every other week  . aspirin 81 MG tablet Take 81 mg by mouth daily.  . Blood Glucose  Monitoring Suppl (ACCU-CHEK GUIDE) w/Device KIT   . Cholecalciferol (VITAMIN D) 2000 units CAPS Take 2,000 Units by mouth daily.   . cyanocobalamin 1000 MCG tablet Take 1,000 mcg by mouth daily.  [name] desvenlafaxine (PRISTIQ) 100 MG 24 hr tablet Take 100 mg by mouth daily.  [name] docusate sodium (COLACE) 100 MG capsule Take 100 mg by mouth at bedtime.  [name] ezetimibe (ZETIA) 10 MG tablet Take 10 mg by mouth at bedtime.  . isosorbide mononitrate (IMDUR) 60 MG 24 hr tablet Take 60 mg by mouth every morning.   . levETIRAcetam (KEPPRA) 750 MG tablet TAKE 1 TABLET(750 MG) BY MOUTH TWICE DAILY  . lisinopril (PRINIVIL,ZESTRIL) 5 MG tablet Take 5 mg by mouth every morning.   . metoprolol tartrate (LOPRESSOR) 25 MG tablet TAKE 1 TABLET(25 MG) BY MOUTH TWICE DAILY  . Multiple Vitamins-Minerals (CENTRUM SILVER 50+MEN) TABS Take 1 tablet by mouth every morning.  . nitroGLYCERIN (NITROSTAT) 0.4 MG SL tablet Place 1 tablet (0.4 mg total) under the tongue every 5 (five) minutes as needed for chest pain.  [name] NOVOLOG 100 UNIT/ML injection   . omeprazole (PRILOSEC) 20 MG capsule TAKE 1 CAPSULE(20 MG) BY MOUTH DAILY. NEED OFFICE VISIT FOR FURTHER REFILLS.  [name] OXYGEN Place 2 L into the nose at bedtime.  [name] PRALUENT 150 MG/ML SOAJ Inject 150 mg into the skin.  . rosuvastatin (CRESTOR) 40 MG tablet Take 40 mg by mouth at bedtime.   . TEGRETOL-XR 200 MG 12 hr tablet TAKE 3 TABLETS BY MOUTH TWO TIMES A DAY.     Allergies:   Latex   Social History   Socioeconomic History  . Marital status: Married    Spouse name: [name]  . Number of children: 0  . Years of education: college  . Highest education level: Not on file  Occupational History  . 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 Smoking status: Never Smoker  . Smokeless tobacco: Never Used  Vaping Use  . Vaping Use: Never used  Substance and Sexual Activity  . Alcohol use: No  . Drug use: No  . Sexual activity: Yes  Other  Topics Concern  . Not on file  Social History Narrative   Patient lives with his wifeBenjamine Mola).   Patient does not have any children.   Patient is on disability.   Patient is left-handed (writes), but right hand with sports.   Patient has a college education.   Patient drinks 5-6 cups of caffeine daily.   Social Determinants of Health   Financial Resource Strain: Not on file  Food Insecurity: Not on file  Transportation Needs: Not on file  Physical Activity: Not on file  Stress: Not on file  Social Connections: Not on file     Family History: The patient's family history includes Arthritis in his sister; Coronary artery disease (age of onset: 45) in his  father; Crohn's disease in his mother; Diabetes in his father; Hearing loss in his mother; Hypertension in his father; Squamous cell carcinoma in his brother.  ROS:   Please see the history of present illness.    All other systems reviewed and are negative.  EKGs/Labs/Other Studies Reviewed:    The following studies were reviewed today: Myoview 04-11-2018: Study Highlights   Nuclear stress EF: 70%.  The left ventricular ejection fraction is hyperdynamic (>65%).  There was no ST segment deviation noted during stress. Transient second degree and third degree AV block were noted during infusion.  The study is normal.  This is a low risk study.  EKG:  EKG is not ordered today.   Recent Labs: No results found for requested labs within last 8760 hours.  Recent Lipid Panel    Component Value Date/Time   CHOL 138 06/03/2011 0640   TRIG 111 06/03/2011 0640   HDL 42 06/03/2011 0640   CHOLHDL 3.3 06/03/2011 0640   VLDL 22 06/03/2011 0640   LDLCALC 74 06/03/2011 0640     Risk Assessment/Calculations:       Physical Exam:    VS:  BP 132/70   Pulse 92   Ht _0  (1.803 m)   Wt 233 lb 9.6 oz (106 kg)   SpO2 95%   BMI 32.58 kg/m     Wt Readings from Last 3 Encounters:  06/13/20 233 lb 9.6 oz (106 kg)   05/27/20 232 lb 6.4 oz (105.4 kg)  12/21/19 229 lb 12.8 oz (104.2 kg)     GEN:  Well nourished, well developed in no acute distress HEENT: Normal NECK: No JVD; No carotid bruits LYMPHATICS: No lymphadenopathy CARDIAC: RRR, no murmurs, rubs, gallops RESPIRATORY:  Clear to auscultation without rales, wheezing or rhonchi  ABDOMEN: Soft, non-tender, non-distended MUSCULOSKELETAL:  No edema; No deformity  SKIN: Warm and dry NEUROLOGIC:  Alert and oriented x 3 PSYCHIATRIC:  Normal affect   ASSESSMENT:    1. Coronary artery disease involving native coronary artery of native heart with angina pectoris ([place])   2. Mixed hyperlipidemia   3. Essential hypertension   4. Type 1 diabetes mellitus with other specified complication (HCC)    PLAN:    In order of problems listed above:  1. Doing very well from CV perspective on current Rx. Treated with ASA, metoprolol, and high-intensity statin drug. He feels like the addition of isosorbide several years ago has really helped him with respect to his functional capacity.  2. Now treated with a high intensity statin drug, ezetimibe, and praluent. Lipids as per HPI. Treated by Dr Joylene Draft.  3. BP well controlled on lisinopril and metoprolol 4. Most recent HgB A1C is 7.1 per his report.   Medication Adjustments/Labs and Tests Ordered: Current medicines are reviewed at length with the patient today.  Concerns regarding medicines are outlined above.  No orders of the defined types were placed in this encounter.  No orders of the defined types were placed in this encounter.   There are no Patient Instructions on file for this visit.   Signed, Sherren Mocha, MD  06/13/2020 10:32 AM    [facility]

## 2020-06-26 ENCOUNTER — Other Ambulatory Visit: Payer: Self-pay | Admitting: Gastroenterology

## 2020-07-01 ENCOUNTER — Telehealth: Payer: Self-pay | Admitting: Cardiovascular Disease

## 2020-07-01 NOTE — Telephone Encounter (Signed)
Patient is calling to follow up regarding patient message from 06/24/20. He states he will appear in [place] before the [facility] Medical Review Board on 06/720. Patient is requesting to have Dr. Excell Seltzer review the attachment and comment as needed. Please assist.

## 2020-07-03 ENCOUNTER — Encounter: Payer: Self-pay | Admitting: Cardiovascular Disease

## 2020-07-03 NOTE — Telephone Encounter (Signed)
Follow up:     Patient is calling stating that his hearing is Friday 07/04/2020 in [place] and the patient need the note for his hearing. He called on 07/01/2020 and has not heard anything from the nurse please call patient and advise.

## 2020-07-03 NOTE — Telephone Encounter (Signed)
Informed the patient Dr. Excell Seltzer has written a letter and is ready for pick up.  He will be here by 5PM.

## 2020-07-03 NOTE — Telephone Encounter (Signed)
Confirmed with check-in the patient picked up his letter.

## 2020-07-06 DIAGNOSIS — E1040 Type 1 diabetes mellitus with diabetic neuropathy, unspecified: Secondary | ICD-10-CM | POA: Diagnosis not present

## 2020-07-17 DIAGNOSIS — H47293 Other optic atrophy, bilateral: Secondary | ICD-10-CM | POA: Diagnosis not present

## 2020-07-17 DIAGNOSIS — Z961 Presence of intraocular lens: Secondary | ICD-10-CM | POA: Diagnosis not present

## 2020-07-17 DIAGNOSIS — E103593 Type 1 diabetes mellitus with proliferative diabetic retinopathy without macular edema, bilateral: Secondary | ICD-10-CM | POA: Diagnosis not present

## 2020-07-17 DIAGNOSIS — H35371 Puckering of macula, right eye: Secondary | ICD-10-CM | POA: Diagnosis not present

## 2020-07-17 DIAGNOSIS — H3321 Serous retinal detachment, right eye: Secondary | ICD-10-CM | POA: Diagnosis not present

## 2020-07-29 DIAGNOSIS — F429 Obsessive-compulsive disorder, unspecified: Secondary | ICD-10-CM | POA: Diagnosis not present

## 2020-08-01 ENCOUNTER — Telehealth: Payer: Self-pay | Admitting: Gastroenterology

## 2020-08-01 NOTE — Telephone Encounter (Signed)
The pt has been advised that he will need to have an office visit for any further refills. He has not been seen since 2018.  The pt will call and set appt up after checking schedule.

## 2020-08-01 NOTE — Telephone Encounter (Signed)
Patient calling to ask if he should still be taking Omeprazole and if so he is requesting a refill

## 2020-08-02 ENCOUNTER — Other Ambulatory Visit: Payer: Self-pay | Admitting: Cardiovascular Disease

## 2020-08-02 DIAGNOSIS — I25719 Atherosclerosis of autologous vein coronary artery bypass graft(s) with unspecified angina pectoris: Secondary | ICD-10-CM

## 2020-08-02 DIAGNOSIS — I10 Essential (primary) hypertension: Secondary | ICD-10-CM

## 2020-08-02 DIAGNOSIS — E785 Hyperlipidemia, unspecified: Secondary | ICD-10-CM

## 2020-08-05 ENCOUNTER — Telehealth: Payer: Self-pay | Admitting: Cardiovascular Disease

## 2020-08-05 DIAGNOSIS — I10 Essential (primary) hypertension: Secondary | ICD-10-CM

## 2020-08-05 DIAGNOSIS — E785 Hyperlipidemia, unspecified: Secondary | ICD-10-CM

## 2020-08-05 DIAGNOSIS — I25719 Atherosclerosis of autologous vein coronary artery bypass graft(s) with unspecified angina pectoris: Secondary | ICD-10-CM

## 2020-08-05 MED ORDER — METOPROLOL TARTRATE 25 MG PO TABS: ORAL_TABLET | ORAL | 3 refills | Status: DC

## 2020-08-05 NOTE — Telephone Encounter (Signed)
°*  STAT* If patient is at the pharmacy, call can be transferred to refill team. ° ° °1. Which medications need to be refilled? (please list name of each medication and dose if known) metoprolol tartrate (LOPRESSOR) 25 MG tablet ° °2. Which pharmacy/location (including street and city if local pharmacy) is medication to be sent to?  °[id number] - [place], [place] - [address] AT [address]  ° ° ° °3. Do they need a 30 day or 90 day supply? 90 day supply ° ° °

## 2020-08-06 DIAGNOSIS — E1040 Type 1 diabetes mellitus with diabetic neuropathy, unspecified: Secondary | ICD-10-CM | POA: Diagnosis not present

## 2020-08-14 ENCOUNTER — Telehealth: Payer: Self-pay | Admitting: Neurology

## 2020-08-14 NOTE — Telephone Encounter (Signed)
Pt called stating that he is needing the RN to look out for a fax that will be coming in from his insurance company for pre authorization for a medication. Pt was very irritated and did not give any other information.

## 2020-08-14 NOTE — Telephone Encounter (Signed)
PA for BN Tegretol XR 200 on paper.  Unable to complete PA on CMM.    Completed on [employer] paperwork and faxed to [phone]  Received OK transmission  Awaiting determination from [facility]

## 2020-08-14 NOTE — Telephone Encounter (Signed)
Spoke to pt and he states that a PA is needed for BN tegretol, thru [facility],  Last notes in 2014 Dr. Anne Hahn states use BN.

## 2020-08-18 NOTE — Telephone Encounter (Signed)
Received fax approval from [facility] for Tegretol XR 200 MG tablets.    This authorization is good through 06/27/21

## 2020-08-21 DIAGNOSIS — I10 Essential (primary) hypertension: Secondary | ICD-10-CM | POA: Diagnosis not present

## 2020-08-21 DIAGNOSIS — E1039 Type 1 diabetes mellitus with other diabetic ophthalmic complication: Secondary | ICD-10-CM | POA: Diagnosis not present

## 2020-08-21 DIAGNOSIS — E785 Hyperlipidemia, unspecified: Secondary | ICD-10-CM | POA: Diagnosis not present

## 2020-08-21 DIAGNOSIS — Z794 Long term (current) use of insulin: Secondary | ICD-10-CM | POA: Diagnosis not present

## 2020-08-21 DIAGNOSIS — Z4681 Encounter for fitting and adjustment of insulin pump: Secondary | ICD-10-CM | POA: Diagnosis not present

## 2020-09-03 DIAGNOSIS — E1040 Type 1 diabetes mellitus with diabetic neuropathy, unspecified: Secondary | ICD-10-CM | POA: Diagnosis not present

## 2020-09-11 DIAGNOSIS — F339 Major depressive disorder, recurrent, unspecified: Secondary | ICD-10-CM | POA: Diagnosis not present

## 2020-09-11 DIAGNOSIS — E785 Hyperlipidemia, unspecified: Secondary | ICD-10-CM | POA: Diagnosis not present

## 2020-09-11 DIAGNOSIS — E1039 Type 1 diabetes mellitus with other diabetic ophthalmic complication: Secondary | ICD-10-CM | POA: Diagnosis not present

## 2020-09-11 DIAGNOSIS — Z4681 Encounter for fitting and adjustment of insulin pump: Secondary | ICD-10-CM | POA: Diagnosis not present

## 2020-09-11 DIAGNOSIS — I2510 Atherosclerotic heart disease of native coronary artery without angina pectoris: Secondary | ICD-10-CM | POA: Diagnosis not present

## 2020-09-11 DIAGNOSIS — Z794 Long term (current) use of insulin: Secondary | ICD-10-CM | POA: Diagnosis not present

## 2020-09-11 DIAGNOSIS — I10 Essential (primary) hypertension: Secondary | ICD-10-CM | POA: Diagnosis not present

## 2020-09-11 DIAGNOSIS — Q8500 Neurofibromatosis, unspecified: Secondary | ICD-10-CM | POA: Diagnosis not present

## 2020-09-11 DIAGNOSIS — R569 Unspecified convulsions: Secondary | ICD-10-CM | POA: Diagnosis not present

## 2020-09-25 DIAGNOSIS — E1039 Type 1 diabetes mellitus with other diabetic ophthalmic complication: Secondary | ICD-10-CM | POA: Diagnosis not present

## 2020-09-25 DIAGNOSIS — F429 Obsessive-compulsive disorder, unspecified: Secondary | ICD-10-CM | POA: Diagnosis not present

## 2020-09-29 ENCOUNTER — Ambulatory Visit (INDEPENDENT_AMBULATORY_CARE_PROVIDER_SITE_OTHER): Payer: Medicare PPO

## 2020-09-29 ENCOUNTER — Ambulatory Visit (INDEPENDENT_AMBULATORY_CARE_PROVIDER_SITE_OTHER): Payer: Medicare PPO | Admitting: Podiatrist

## 2020-09-29 ENCOUNTER — Other Ambulatory Visit: Payer: Self-pay

## 2020-09-29 ENCOUNTER — Encounter: Payer: Self-pay | Admitting: Podiatrist

## 2020-09-29 DIAGNOSIS — L84 Corns and callosities: Secondary | ICD-10-CM

## 2020-09-29 DIAGNOSIS — M79672 Pain in left foot: Secondary | ICD-10-CM | POA: Diagnosis not present

## 2020-09-29 DIAGNOSIS — M79671 Pain in right foot: Secondary | ICD-10-CM | POA: Diagnosis not present

## 2020-09-29 DIAGNOSIS — M2042 Other hammer toe(s) (acquired), left foot: Secondary | ICD-10-CM | POA: Diagnosis not present

## 2020-09-29 DIAGNOSIS — M2041 Other hammer toe(s) (acquired), right foot: Secondary | ICD-10-CM

## 2020-09-29 DIAGNOSIS — E1069 Type 1 diabetes mellitus with other specified complication: Secondary | ICD-10-CM | POA: Diagnosis not present

## 2020-09-29 DIAGNOSIS — M205X1 Other deformities of toe(s) (acquired), right foot: Secondary | ICD-10-CM | POA: Diagnosis not present

## 2020-09-29 DIAGNOSIS — G629 Polyneuropathy, unspecified: Secondary | ICD-10-CM

## 2020-09-29 DIAGNOSIS — E119 Type 2 diabetes mellitus without complications: Secondary | ICD-10-CM | POA: Diagnosis not present

## 2020-09-29 NOTE — Patient Instructions (Signed)
Diabetes Mellitus and Foot Care Foot care is an important part of your health, especially when you have diabetes. Diabetes may cause you to have problems because of poor blood flow (circulation) to your feet and legs, which can cause your skin to:  Become thinner and drier.  Break more easily.  Heal more slowly.  Peel and crack. You may also have nerve damage (neuropathy) in your legs and feet, causing decreased feeling in them. This means that you may not notice minor injuries to your feet that could lead to more serious problems. Noticing and addressing any potential problems early is the best way to prevent future foot problems. How to care for your feet Foot hygiene  Wash your feet daily with warm water and mild soap. Do not use hot water. Then, pat your feet and the areas between your toes until they are completely dry. Do not soak your feet as this can dry your skin.  Trim your toenails straight across. Do not dig under them or around the cuticle. File the edges of your nails with an emery board or nail file.  Apply a moisturizing lotion or petroleum jelly to the skin on your feet and to dry, brittle toenails. Use lotion that does not contain alcohol and is unscented. Do not apply lotion between your toes.   Shoes and socks  Wear clean socks or stockings every day. Make sure they are not too tight. Do not wear knee-high stockings since they may decrease blood flow to your legs.  Wear shoes that fit properly and have enough cushioning. Always look in your shoes before you put them on to be sure there are no objects inside.  To break in new shoes, wear them for just a few hours a day. This prevents injuries on your feet. Wounds, scrapes, corns, and calluses  Check your feet daily for blisters, cuts, bruises, sores, and redness. If you cannot see the bottom of your feet, use a mirror or ask someone for help.  Do not cut corns or calluses or try to remove them with medicine.  If you  find a minor scrape, cut, or break in the skin on your feet, keep it and the skin around it clean and dry. You may clean these areas with mild soap and water. Do not clean the area with peroxide, alcohol, or iodine.  If you have a wound, scrape, corn, or callus on your foot, look at it several times a day to make sure it is healing and not infected. Check for: ? Redness, swelling, or pain. ? Fluid or blood. ? Warmth. ? Pus or a bad smell.   General tips  Do not cross your legs. This may decrease blood flow to your feet.  Do not use heating pads or hot water bottles on your feet. They may burn your skin. If you have lost feeling in your feet or legs, you may not know this is happening until it is too late.  Protect your feet from hot and cold by wearing shoes, such as at the beach or on hot pavement.  Schedule a complete foot exam at least once a year (annually) or more often if you have foot problems. Report any cuts, sores, or bruises to your health care provider immediately. Where to find more information  American Diabetes Association: www.diabetes.org  Association of Diabetes Care & Education Specialists: www.diabeteseducator.org Contact a health care provider if:  You have a medical condition that increases your risk of infection and   you have any cuts, sores, or bruises on your feet.  You have an injury that is not healing.  You have redness on your legs or feet.  You feel burning or tingling in your legs or feet.  You have pain or cramps in your legs and feet.  Your legs or feet are numb.  Your feet always feel cold.  You have pain around any toenails. Get help right away if:  You have a wound, scrape, corn, or callus on your foot and: ? You have pain, swelling, or redness that gets worse. ? You have fluid or blood coming from the wound, scrape, corn, or callus. ? Your wound, scrape, corn, or callus feels warm to the touch. ? You have pus or a bad smell coming from  the wound, scrape, corn, or callus. ? You have a fever. ? You have a red line going up your leg. Summary  Check your feet every day for blisters, cuts, bruises, sores, and redness.  Apply a moisturizing lotion or petroleum jelly to the skin on your feet and to dry, brittle toenails.  Wear shoes that fit properly and have enough cushioning.  If you have foot problems, report any cuts, sores, or bruises to your health care provider immediately.  Schedule a complete foot exam at least once a year (annually) or more often if you have foot problems. This information is not intended to replace advice given to you by your health care provider. Make sure you discuss any questions you have with your health care provider. Document Revised: 01/03/2020 Document Reviewed: 01/03/2020 Elsevier Patient Education  2021 Elsevier Inc.  

## 2020-09-29 NOTE — Progress Notes (Signed)
Chief Complaint  Patient presents with  . Callouses    Pt diabetic doctor referred pt to have bilateral calluses examined for preventative care due to pt being a diabetic.      HPI: Patient is 55 y.o. male who presents today for a preventive diabetic foot examination.  His primary care physician is Dr. Joylene Draft who recommended Mr. Laws be seen.  He has no problems with his feet aside from calluses on the medial sides of his midfoot/bunion prominence region.  He relates he wears new balance shoes that he gets from the [employer].  As well as some Dr. Gibson Ramp diabetic shoes he obtained from [facility] supply in [name] about 2 years ago.    Patient Active Problem List   Diagnosis Date Noted  . Special screening for malignant neoplasms, colon   . Diverticulosis of colon without hemorrhage   . Toe fracture, right   . Sleep apnea   . Seizure disorder ([place])   . Seasonal allergic rhinitis   . Myocardial infarction ([place])   . Hyperlipidemia   . History of oxygen administration   . Hemorrhoid   . Diabetes mellitus type I ([place])   . Depression   . Cataract   . CAD (coronary artery disease)   . Anxiety   . Odynophagia 11/28/2014  . 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 HTN (hypertension) 03/31/2014  . Partial epilepsy with impairment of consciousness ([place]) 04/24/2013  . 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 Pain in limb 04/24/2013  . Encounter for therapeutic drug monitoring 04/24/2013  . Postop check 12/08/2011  . Leg pain, left 12/17/2010  . HYPERLIPIDEMIA-MIXED 11/28/2009  . ACUT MI SUBENDOCARDIAL INFARCT SUBSQT EPIS CARE 11/28/2009  . CORONARY ATHEROSLERO AUTOL VEIN BYPASS GRAFT 11/28/2009    Current Outpatient Medications on File Prior to Visit  Medication Sig Dispense Refill  . ACCU-CHEK GUIDE test strip     . Accu-Chek Softclix Lancets lancets     . acetaminophen (TYLENOL) 500 MG tablet Take 1,000 mg by mouth  every 6 (six) hours as needed for mild pain.    . Alirocumab (PRALUENT) 150 MG/ML SOAJ Inject into the skin. Per patient taking every other week    . aspirin 81 MG tablet Take 81 mg by mouth daily.    . Blood Glucose Monitoring Suppl (ACCU-CHEK GUIDE) w/Device KIT     . Cholecalciferol (VITAMIN D) 2000 units CAPS Take 2,000 Units by mouth daily.     . cyanocobalamin 1000 MCG tablet Take 1,000 mcg by mouth daily.    [name] desvenlafaxine (PRISTIQ) 100 MG 24 hr tablet Take 100 mg by mouth daily.    [name] docusate sodium (COLACE) 100 MG capsule Take 100 mg by mouth at bedtime.    [name] ezetimibe (ZETIA) 10 MG tablet Take 10 mg by mouth at bedtime.    . isosorbide mononitrate (IMDUR) 60 MG 24 hr tablet Take 60 mg by mouth every morning.     . levETIRAcetam (KEPPRA) 750 MG tablet TAKE 1 TABLET(750 MG) BY MOUTH TWICE DAILY 180 tablet 3  . lisinopril (PRINIVIL,ZESTRIL) 5 MG tablet Take 5 mg by mouth every morning.     . metoprolol tartrate (LOPRESSOR) 25 MG tablet TAKE 1 TABLET(25 MG) BY MOUTH TWICE DAILY 180 tablet 3  . Multiple Vitamins-Minerals (CENTRUM SILVER 50+MEN) TABS Take 1 tablet by mouth every morning.    . nitroGLYCERIN (NITROSTAT) 0.4 MG SL tablet Place 1 tablet (0.4 mg total) under  the tongue every 5 (five) minutes as needed for chest pain. 25 tablet 5  . NOVOLOG 100 UNIT/ML injection     . omeprazole (PRILOSEC) 20 MG capsule TAKE 1 CAPSULE(20 MG) BY MOUTH DAILY. NEED OFFICE VISIT FOR FURTHER REFILLS. 30 capsule 0  . OXYGEN Place 2 L into the nose at bedtime.    [name] PRALUENT 150 MG/ML SOAJ Inject 150 mg into the skin.    . rosuvastatin (CRESTOR) 40 MG tablet Take 40 mg by mouth at bedtime.     . TEGRETOL-XR 200 MG 12 hr tablet TAKE 3 TABLETS BY MOUTH TWO TIMES A DAY. 540 tablet 3   No current facility-administered medications on file prior to visit.    Allergies  Allergen Reactions  . Latex Rash    Review of Systems No fevers, chills, nausea, muscle aches, no difficulty breathing, no calf  pain, no chest pain or shortness of breath.   Physical Exam  GENERAL APPEARANCE: Alert, conversant. Appropriately groomed. No acute distress.   VASCULAR: Pedal pulses palpable DP and PT bilateral.  Capillary refill time is immediate to all digits,  Proximal to distal cooling it warm to warm.  Digital perfusion adequate.   NEUROLOGIC: sensation is intact to 5.07 monofilament at 2/5 sites bilateral.  Light touch is intact bilateral, vibratory sensation intact bilateral- subjective symptoms of shooting and burning pains to the feet at times is noted.   MUSCULOSKELETAL: cavus foot type is seen.  Forefoot cavus with contracture of digits is noted.  Hallux contracture noted on the right greater than left foot.   Overall acceptable muscle strength, tone and stability bilateral.   No pain, crepitus or limitation noted with foot and ankle range of motion bilateral.   DERMATOLOGIC: skin is warm, supple, and dry.  No open lesions noted.  No rash, no pre ulcerative lesions. Digital nails are normal. Right hallux nail appears like it has been ingrown in the past- not bothering him at todays visit. Small callus present medial first metatarsal head of the left foot.  Minimal callus on the right foot as well- not requiring treatment.   Xray evaluation:  2 views of bilateral feet are obtained.  Lateral view shows high arch foot type with increased calcaneal inclination angle and increased first metatarsal declination angle. No midfoot arthritis seen bilaterally.  No heel spur noted bilateral. Ap view shows contracture of the right hallux at the hallux interphalangeal joint with some arthritic changes present.  Left foot hallux is mildly contracted with no arthritic changes seen.   Assessment     ICD-10-CM   1. Hammertoe, bilateral  M20.41 DG Foot 2 Views Right   M20.42 DG Foot 2 Views Left  2. Encounter for diabetic foot exam (HCC)  E11.9   3. Callus of foot  L84   4. Contracture of toe of right foot   M20.5X1   5. Neuropathy  G62.9   6. Type 1 diabetes mellitus with other specified complication (HCC)  E10.69      Plan -discussed exam findings with the patient.  I looked at his shoes and recommended continued use of his new balance and Dr. Crecencio Mc shoes.  I discussed he would likely be in need of a new pair of shoes in about 6 months.   - I pared the callus on his left first metatarsal head with a 15 blade without complication.  - He would be a candidate for diabetic shoes due to his foot type and callus-  If he would  like for our office to see about diabetic shoes for him we are happy to help, otherwise he is welcome to go to [facility] where he received his last pair.  - I recommended a preventive diabetic foot exam for him on a yearly basis and to call sooner if any problems arise.  - information on diabetes and foot care was dispensed as well.

## 2020-10-04 DIAGNOSIS — E1040 Type 1 diabetes mellitus with diabetic neuropathy, unspecified: Secondary | ICD-10-CM | POA: Diagnosis not present

## 2020-11-03 DIAGNOSIS — E1040 Type 1 diabetes mellitus with diabetic neuropathy, unspecified: Secondary | ICD-10-CM | POA: Diagnosis not present

## 2020-11-17 DIAGNOSIS — F429 Obsessive-compulsive disorder, unspecified: Secondary | ICD-10-CM | POA: Diagnosis not present

## 2020-11-25 ENCOUNTER — Telehealth: Payer: Self-pay | Admitting: Neurology

## 2020-11-25 DIAGNOSIS — I10 Essential (primary) hypertension: Secondary | ICD-10-CM | POA: Diagnosis not present

## 2020-11-25 DIAGNOSIS — I2510 Atherosclerotic heart disease of native coronary artery without angina pectoris: Secondary | ICD-10-CM | POA: Diagnosis not present

## 2020-11-25 DIAGNOSIS — Z4681 Encounter for fitting and adjustment of insulin pump: Secondary | ICD-10-CM | POA: Diagnosis not present

## 2020-11-25 DIAGNOSIS — Z794 Long term (current) use of insulin: Secondary | ICD-10-CM | POA: Diagnosis not present

## 2020-11-25 DIAGNOSIS — E785 Hyperlipidemia, unspecified: Secondary | ICD-10-CM | POA: Diagnosis not present

## 2020-11-25 DIAGNOSIS — E1039 Type 1 diabetes mellitus with other diabetic ophthalmic complication: Secondary | ICD-10-CM | POA: Diagnosis not present

## 2020-11-25 NOTE — Telephone Encounter (Signed)
Pt called, pharmacy said there is a shortage for TEGRETOL-XR 200 MG 12 hr tablet. What do I need do for substitute? Would like a call from the nurse.

## 2020-11-26 NOTE — Telephone Encounter (Signed)
I called spoke to pharmacy patient received a 30-day supply of brand-name Tegretol on 5/ 27/22 for 30 day supply.  They are not real sure when the backorder will be released.  They are able to check at other similar pharmacies in regards to patient's prescription  and they can transfer for  him as well.

## 2020-11-26 NOTE — Telephone Encounter (Signed)
I called left message voicemail for patient that I returned call. [facility] called around to other pharmacies to see if they have a supply in may be just getting some until the backorder or shortages resolved. Give me a call back [phone].

## 2020-11-27 ENCOUNTER — Telehealth: Payer: Self-pay | Admitting: Neurology

## 2020-11-27 MED ORDER — TEGRETOL-XR 200 MG PO TB12: ORAL_TABLET | ORAL | 0 refills | Status: DC

## 2020-11-27 NOTE — Telephone Encounter (Signed)
May not have any other choice but to switch to generic. If that is the case, we can check blood levels on brand name, then check on generic to ensure are comparable. Did he have seizures on generic previously that he remembers? Okay for him to call around and see if brand name available at other pharmacies.

## 2020-11-27 NOTE — Telephone Encounter (Signed)
Pt called back and stated that the [facility] in [facility] will be able to accomodate his TEGRETOL-XR 200 MG 12 hr tablet Pt states that [facility] has given him a partial.

## 2020-11-27 NOTE — Telephone Encounter (Signed)
I called him, has called several pharmacies, are out of brand name, he has at least 13 days of medication left, will call me next week if still no medication available will switch to generic, compare blood levels. He has never tried generic. Brand name costs about $ 100 for 3 month supply. No recent seizures. Stay in touch.

## 2020-11-27 NOTE — Telephone Encounter (Signed)
Called pt and has taken generic previously but per pt stated Dr. Anne Hahn wanted him on BN tegretol.  It is on manufacturer back order.  He has 10-11 days left from his supply from Pharmacy.  He will check with pharmacy if they have any word about backorder.  He called other pharmacy ([facility]) and [facility].   ( same info).  Ok for generic?

## 2020-11-27 NOTE — Telephone Encounter (Signed)
I called the patient, sent in to [facility], 3 month supply script with option to fill 1 month of brand name.

## 2020-12-01 ENCOUNTER — Telehealth: Payer: Self-pay | Admitting: Podiatrist

## 2020-12-01 NOTE — Telephone Encounter (Signed)
Left message for pt to call to schedule an appt for diabetic shoes and apologized that I think it was a misunderstanding.

## 2020-12-04 DIAGNOSIS — E1040 Type 1 diabetes mellitus with diabetic neuropathy, unspecified: Secondary | ICD-10-CM | POA: Diagnosis not present

## 2021-01-13 DIAGNOSIS — F429 Obsessive-compulsive disorder, unspecified: Secondary | ICD-10-CM | POA: Diagnosis not present

## 2021-02-03 DIAGNOSIS — E109 Type 1 diabetes mellitus without complications: Secondary | ICD-10-CM | POA: Diagnosis not present

## 2021-02-06 DIAGNOSIS — Z125 Encounter for screening for malignant neoplasm of prostate: Secondary | ICD-10-CM | POA: Diagnosis not present

## 2021-02-06 DIAGNOSIS — E1039 Type 1 diabetes mellitus with other diabetic ophthalmic complication: Secondary | ICD-10-CM | POA: Diagnosis not present

## 2021-02-06 DIAGNOSIS — E785 Hyperlipidemia, unspecified: Secondary | ICD-10-CM | POA: Diagnosis not present

## 2021-02-10 DIAGNOSIS — F429 Obsessive-compulsive disorder, unspecified: Secondary | ICD-10-CM | POA: Diagnosis not present

## 2021-02-13 DIAGNOSIS — I2510 Atherosclerotic heart disease of native coronary artery without angina pectoris: Secondary | ICD-10-CM | POA: Diagnosis not present

## 2021-02-13 DIAGNOSIS — Z0000 Encounter for general adult medical examination without abnormal findings: Secondary | ICD-10-CM | POA: Diagnosis not present

## 2021-02-13 DIAGNOSIS — E785 Hyperlipidemia, unspecified: Secondary | ICD-10-CM | POA: Diagnosis not present

## 2021-02-13 DIAGNOSIS — E1040 Type 1 diabetes mellitus with diabetic neuropathy, unspecified: Secondary | ICD-10-CM | POA: Diagnosis not present

## 2021-02-13 DIAGNOSIS — E669 Obesity, unspecified: Secondary | ICD-10-CM | POA: Diagnosis not present

## 2021-02-13 DIAGNOSIS — Z4681 Encounter for fitting and adjustment of insulin pump: Secondary | ICD-10-CM | POA: Diagnosis not present

## 2021-02-13 DIAGNOSIS — R82998 Other abnormal findings in urine: Secondary | ICD-10-CM | POA: Diagnosis not present

## 2021-02-13 DIAGNOSIS — I10 Essential (primary) hypertension: Secondary | ICD-10-CM | POA: Diagnosis not present

## 2021-02-13 DIAGNOSIS — M8580 Other specified disorders of bone density and structure, unspecified site: Secondary | ICD-10-CM | POA: Diagnosis not present

## 2021-02-13 DIAGNOSIS — N401 Enlarged prostate with lower urinary tract symptoms: Secondary | ICD-10-CM | POA: Diagnosis not present

## 2021-02-25 DIAGNOSIS — I10 Essential (primary) hypertension: Secondary | ICD-10-CM | POA: Diagnosis not present

## 2021-02-25 DIAGNOSIS — S80862A Insect bite (nonvenomous), left lower leg, initial encounter: Secondary | ICD-10-CM | POA: Diagnosis not present

## 2021-02-25 DIAGNOSIS — E1039 Type 1 diabetes mellitus with other diabetic ophthalmic complication: Secondary | ICD-10-CM | POA: Diagnosis not present

## 2021-02-25 DIAGNOSIS — W57XXXA Bitten or stung by nonvenomous insect and other nonvenomous arthropods, initial encounter: Secondary | ICD-10-CM | POA: Diagnosis not present

## 2021-02-25 DIAGNOSIS — L089 Local infection of the skin and subcutaneous tissue, unspecified: Secondary | ICD-10-CM | POA: Diagnosis not present

## 2021-03-09 DIAGNOSIS — F429 Obsessive-compulsive disorder, unspecified: Secondary | ICD-10-CM | POA: Diagnosis not present

## 2021-03-11 DIAGNOSIS — I10 Essential (primary) hypertension: Secondary | ICD-10-CM | POA: Diagnosis not present

## 2021-03-11 DIAGNOSIS — I2510 Atherosclerotic heart disease of native coronary artery without angina pectoris: Secondary | ICD-10-CM | POA: Diagnosis not present

## 2021-03-11 DIAGNOSIS — Z794 Long term (current) use of insulin: Secondary | ICD-10-CM | POA: Diagnosis not present

## 2021-03-11 DIAGNOSIS — E1039 Type 1 diabetes mellitus with other diabetic ophthalmic complication: Secondary | ICD-10-CM | POA: Diagnosis not present

## 2021-03-11 DIAGNOSIS — Z4681 Encounter for fitting and adjustment of insulin pump: Secondary | ICD-10-CM | POA: Diagnosis not present

## 2021-04-04 DIAGNOSIS — Z23 Encounter for immunization: Secondary | ICD-10-CM | POA: Diagnosis not present

## 2021-04-14 DIAGNOSIS — F429 Obsessive-compulsive disorder, unspecified: Secondary | ICD-10-CM | POA: Diagnosis not present

## 2021-04-22 ENCOUNTER — Ambulatory Visit: Payer: Medicare PPO | Admitting: Neurology

## 2021-04-22 ENCOUNTER — Encounter: Payer: Self-pay | Admitting: Neurology

## 2021-04-22 VITALS — BP 113/72 | HR 82 | Ht 71.0 in | Wt 232.6 lb

## 2021-04-22 DIAGNOSIS — Q8501 Neurofibromatosis, type 1: Secondary | ICD-10-CM | POA: Diagnosis not present

## 2021-04-22 DIAGNOSIS — Z5181 Encounter for therapeutic drug level monitoring: Secondary | ICD-10-CM | POA: Diagnosis not present

## 2021-04-22 DIAGNOSIS — G40209 Localization-related (focal) (partial) symptomatic epilepsy and epileptic syndromes with complex partial seizures, not intractable, without status epilepticus: Secondary | ICD-10-CM

## 2021-04-22 MED ORDER — LEVETIRACETAM 750 MG PO TABS: ORAL_TABLET | ORAL | 3 refills | Status: DC

## 2021-04-22 NOTE — Progress Notes (Signed)
Reason for visit: Neurofibromatosis type I, seizures  Jeff [name] is an 54 y.o. male  History of present illness:  Jeff [name] is a 55 year old left-handed white male with a history of neurofibromatosis type I.  The patient has type 1 diabetes and a history of seizures.  Last seizure was about 2 years ago unfortunately while driving.  He currently does not operate a motor vehicle but mainly because of visual issues associated with diabetic retinopathy.  The patient reports that he has chronic insomnia, he naps off and on throughout the day.  He has not had any further seizures since last seen.  He is tolerating the Keppra and the carbamazepine fairly well.  He returns here for further evaluation.  The patient indicates that he drinks caffeinated Diet Coke on average 3 daily.  Past Medical History:  Diagnosis Date   Anxiety    CAD (coronary artery disease)    Cath 2011.  LAD stent patent, distal LAD occlusion, D1 100%, OM branch 80 - 90%, SVG to diag patent, LIMA to LAD occluded, SVG to RCA occluded, SVG to OM was occluded.   No change from previous cath.  Managed medically.    Cataract    Depression    Diabetes mellitus type I ([place])    on insulin pump at  home   Diabetic retinopathy    s/p vitrectomy and history of retinal surgery   Hemorrhoid    History of oxygen administration    nocutural use only at 2 l/m nasally.   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   CORONARY ARTERY BYPASS GRAFT     4 vessel 4/11   ESOPHAGOGASTRODUODENOSCOPY (EGD) WITH PROPOFOL N/A 12/05/2014   Procedure: ESOPHAGOGASTRODUODENOSCOPY (EGD) WITH PROPOFOL;  Surgeon: Milus Banister, MD;  Location: [facility];  Service: Endoscopy;  Laterality: N/A;   LAPAROSCOPIC APPENDECTOMY  11/24/2011   LEFT HEART CATHETERIZATION WITH CORONARY/GRAFT ANGIOGRAM N/A 02/22/2013   Procedure: LEFT HEART CATHETERIZATION WITH Beatrix Fetters;  Surgeon: Sherren Mocha, MD;  Location: [facility] CATH LAB;  Service: Cardiovascular;  Laterality: N/A;   release of right transverse carpal ligament     right eye vitrectomy and detached retina repair  10/2007   TONSILLECTOMY     uvulopalatoplasty surgery      Family History  Problem Relation Age of Onset   Coronary artery disease Father 46   Diabetes Father        type II   Hypertension Father    Squamous cell carcinoma Brother    Arthritis Sister    Crohn's disease Mother    Hearing loss Mother     Social history:  reports that he has never smoked. He has never used smokeless tobacco. He reports  that he does not drink alcohol and does not use drugs.    Allergies  Allergen Reactions   Latex Rash    Medications:  Prior to Admission medications   Medication Sig Start Date End Date Taking? Authorizing Provider  ACCU-CHEK GUIDE test strip  11/22/19   [provider]  Accu-Chek Softclix Lancets lancets  11/22/19   [provider]  acetaminophen (TYLENOL) 500 MG tablet Take 1,000 mg by mouth every 6 (six) hours as needed for mild pain.    [provider]  Alirocumab (PRALUENT) 150 MG/ML SOAJ Inject into the skin. Per patient taking every other week    [provider]  aspirin 81 MG tablet Take 81 mg by mouth daily.    [provider]  Blood Glucose Monitoring Suppl (ACCU-CHEK  GUIDE) w/Device KIT  09/07/19   [provider]  Cholecalciferol (VITAMIN D) 2000 units CAPS Take 2,000 Units by mouth daily.     [provider]  cyanocobalamin 1000 MCG tablet Take 1,000 mcg by mouth daily.    [provider]  desvenlafaxine (PRISTIQ) 100 MG 24 hr tablet Take 100 mg by mouth daily. 04/03/16   [provider]  docusate sodium (COLACE) 100 MG capsule Take 100 mg by mouth at bedtime.    [provider]  ezetimibe (ZETIA) 10 MG tablet Take 10 mg by mouth at bedtime.    [provider]  isosorbide mononitrate (IMDUR) 60 MG 24 hr tablet Take 60 mg by mouth every morning.     [provider]  levETIRAcetam (KEPPRA) 750 MG tablet TAKE 1 TABLET(750 MG) BY MOUTH TWICE DAILY 05/27/20   Suzzanne Cloud, NP  lisinopril (PRINIVIL,ZESTRIL) 5 MG tablet Take 5 mg by mouth every morning.     [provider]  metoprolol tartrate (LOPRESSOR) 25 MG tablet TAKE 1 TABLET(25 MG) BY MOUTH TWICE DAILY 08/05/20   Sherren Mocha, MD  Multiple Vitamins-Minerals (CENTRUM SILVER 50+MEN) TABS Take 1 tablet by mouth every morning.    [provider]  nitroGLYCERIN (NITROSTAT) 0.4 MG SL tablet Place 1 tablet (0.4 mg total) under the tongue every 5 (five) minutes as needed for chest pain. 06/11/19   Sherren Mocha, MD  NOVOLOG 100 UNIT/ML injection  11/22/19   [provider]  omeprazole (PRILOSEC) 20 MG capsule TAKE 1 CAPSULE(20 MG) BY MOUTH DAILY. NEED OFFICE VISIT FOR FURTHER REFILLS. 08/23/19   Milus Banister, MD  OXYGEN Place 2 L into the nose at bedtime.    [provider]  PRALUENT 150 MG/ML SOAJ Inject 150 mg into the skin. 05/18/20   [provider]  rosuvastatin (CRESTOR) 40 MG tablet Take 40 mg by mouth at bedtime.     [provider]  TEGRETOL-XR 200 MG 12 hr tablet TAKE 3 TABLETS BY MOUTH TWO TIMES A DAY. 11/27/20   Suzzanne Cloud, NP    ROS:  Out of a complete 14 system review of  symptoms, the patient complains only of the following symptoms, and all other reviewed systems are negative.  Insomnia Daytime drowsiness History of seizures Decreased visual acuity  Blood pressure 113/72, pulse 82, height _0  (1.803 m), weight 232 lb 9.6 oz (105.5 kg).  Physical Exam  General: The patient is alert and cooperative at the time of the examination.  Skin: No significant peripheral edema is noted.   Neurologic Exam  Mental status: The patient is alert and oriented x 3 at the time of the examination. The patient  has apparent normal recent and remote memory, with an apparently normal attention span and concentration ability.   Cranial nerves: Facial symmetry is present. Speech is normal, no aphasia or dysarthria is noted. Extraocular movements are full. Visual fields are full.  Motor: The patient has good strength in all 4 extremities.  Sensory examination: Soft touch sensation is symmetric on the face, arms, and legs.  Coordination: The patient has good finger-nose-finger and heel-to-shin bilaterally.  Gait and station: The patient has a normal gait. Tandem gait is slightly unsteady.  Romberg is negative. No drift is seen.  Reflexes: Deep tendon reflexes are symmetric.   Assessment/Plan:  1.  History of seizures  2.  Chronic insomnia  The patient is instructed to cut out his caffeinated drinks during the day.  In the evening he will take 5 mg of melatonin.  He is not to nap during the day or sleep on the couch in the evening.  He is to only sleep in bed.  He will continue the carbamazepine and the Keppra.  A prescription for the Keppra was sent in.  He will have blood work done today.  He will follow-up in 1 year.  In the future he can be followed through Dr. April Manson.  Jill Alexanders MD 04/22/2021 4:03 PM  [facility] [address] [place], [place] [address]  Phone [phone] Fax [phone]

## 2021-04-25 LAB — CBC WITH DIFFERENTIAL/PLATELET
Basophils Absolute: 0 10*3/uL (ref 0.0–0.2)
Basos: 1 %
EOS (ABSOLUTE): 0.1 10*3/uL (ref 0.0–0.4)
Eos: 4 %
Hematocrit: 43.2 % (ref 37.5–51.0)
Hemoglobin: 15.1 g/dL (ref 13.0–17.7)
Immature Grans (Abs): 0 10*3/uL (ref 0.0–0.1)
Immature Granulocytes: 0 %
Lymphocytes Absolute: 1.1 10*3/uL (ref 0.7–3.1)
Lymphs: 26 %
MCH: 31.1 pg (ref 26.6–33.0)
MCHC: 35 g/dL (ref 31.5–35.7)
MCV: 89 fL (ref 79–97)
Monocytes Absolute: 0.5 10*3/uL (ref 0.1–0.9)
Monocytes: 12 %
Neutrophils Absolute: 2.3 10*3/uL (ref 1.4–7.0)
Neutrophils: 57 %
Platelets: 194 10*3/uL (ref 150–450)
RBC: 4.85 x10E6/uL (ref 4.14–5.80)
RDW: 12.2 % (ref 11.6–15.4)
WBC: 4.1 10*3/uL (ref 3.4–10.8)

## 2021-04-25 LAB — COMPREHENSIVE METABOLIC PANEL
ALT: 32 IU/L (ref 0–44)
AST: 26 IU/L (ref 0–40)
Albumin/Globulin Ratio: 2.4 — ABNORMAL HIGH (ref 1.2–2.2)
Albumin: 4.3 g/dL (ref 3.8–4.9)
Alkaline Phosphatase: 100 IU/L (ref 44–121)
BUN/Creatinine Ratio: 11 (ref 9–20)
BUN: 9 mg/dL (ref 6–24)
Bilirubin Total: 0.3 mg/dL (ref 0.0–1.2)
CO2: 28 mmol/L (ref 20–29)
Calcium: 9.1 mg/dL (ref 8.7–10.2)
Chloride: 102 mmol/L (ref 96–106)
Creatinine, Ser: 0.79 mg/dL (ref 0.76–1.27)
Globulin, Total: 1.8 g/dL (ref 1.5–4.5)
Glucose: 231 mg/dL — ABNORMAL HIGH (ref 70–99)
Potassium: 4.9 mmol/L (ref 3.5–5.2)
Sodium: 141 mmol/L (ref 134–144)
Total Protein: 6.1 g/dL (ref 6.0–8.5)
eGFR: 104 mL/min/{1.73_m2} (ref >59)

## 2021-04-25 LAB — CARBAMAZEPINE LEVEL, TOTAL: Carbamazepine (Tegretol), S: 9.9 ug/mL (ref 4.0–12.0)

## 2021-04-25 LAB — LEVETIRACETAM LEVEL: Levetiracetam Lvl: 13.8 ug/mL (ref 10.0–40.0)

## 2021-05-14 DIAGNOSIS — E109 Type 1 diabetes mellitus without complications: Secondary | ICD-10-CM | POA: Diagnosis not present

## 2021-06-01 DIAGNOSIS — F429 Obsessive-compulsive disorder, unspecified: Secondary | ICD-10-CM | POA: Diagnosis not present

## 2021-06-03 ENCOUNTER — Ambulatory Visit: Payer: Medicare PPO | Admitting: Neurology

## 2021-06-12 DIAGNOSIS — E113593 Type 2 diabetes mellitus with proliferative diabetic retinopathy without macular edema, bilateral: Secondary | ICD-10-CM | POA: Diagnosis not present

## 2021-06-12 DIAGNOSIS — Z961 Presence of intraocular lens: Secondary | ICD-10-CM | POA: Diagnosis not present

## 2021-06-12 DIAGNOSIS — Z794 Long term (current) use of insulin: Secondary | ICD-10-CM | POA: Diagnosis not present

## 2021-06-16 DIAGNOSIS — E785 Hyperlipidemia, unspecified: Secondary | ICD-10-CM | POA: Diagnosis not present

## 2021-06-16 DIAGNOSIS — E1040 Type 1 diabetes mellitus with diabetic neuropathy, unspecified: Secondary | ICD-10-CM | POA: Diagnosis not present

## 2021-06-16 DIAGNOSIS — Z794 Long term (current) use of insulin: Secondary | ICD-10-CM | POA: Diagnosis not present

## 2021-06-16 DIAGNOSIS — E1139 Type 2 diabetes mellitus with other diabetic ophthalmic complication: Secondary | ICD-10-CM | POA: Diagnosis not present

## 2021-06-16 DIAGNOSIS — I2510 Atherosclerotic heart disease of native coronary artery without angina pectoris: Secondary | ICD-10-CM | POA: Diagnosis not present

## 2021-06-16 DIAGNOSIS — E1039 Type 1 diabetes mellitus with other diabetic ophthalmic complication: Secondary | ICD-10-CM | POA: Diagnosis not present

## 2021-06-16 DIAGNOSIS — N401 Enlarged prostate with lower urinary tract symptoms: Secondary | ICD-10-CM | POA: Diagnosis not present

## 2021-06-16 DIAGNOSIS — M8580 Other specified disorders of bone density and structure, unspecified site: Secondary | ICD-10-CM | POA: Diagnosis not present

## 2021-06-16 DIAGNOSIS — I10 Essential (primary) hypertension: Secondary | ICD-10-CM | POA: Diagnosis not present

## 2021-06-16 DIAGNOSIS — G4733 Obstructive sleep apnea (adult) (pediatric): Secondary | ICD-10-CM | POA: Diagnosis not present

## 2021-07-09 DIAGNOSIS — M8589 Other specified disorders of bone density and structure, multiple sites: Secondary | ICD-10-CM | POA: Diagnosis not present

## 2021-07-28 DIAGNOSIS — I10 Essential (primary) hypertension: Secondary | ICD-10-CM | POA: Diagnosis not present

## 2021-07-28 DIAGNOSIS — I2510 Atherosclerotic heart disease of native coronary artery without angina pectoris: Secondary | ICD-10-CM | POA: Diagnosis not present

## 2021-07-28 DIAGNOSIS — Z4681 Encounter for fitting and adjustment of insulin pump: Secondary | ICD-10-CM | POA: Diagnosis not present

## 2021-07-28 DIAGNOSIS — E1039 Type 1 diabetes mellitus with other diabetic ophthalmic complication: Secondary | ICD-10-CM | POA: Diagnosis not present

## 2021-07-28 DIAGNOSIS — Z794 Long term (current) use of insulin: Secondary | ICD-10-CM | POA: Diagnosis not present

## 2021-07-30 DIAGNOSIS — H35371 Puckering of macula, right eye: Secondary | ICD-10-CM | POA: Diagnosis not present

## 2021-07-30 DIAGNOSIS — H3321 Serous retinal detachment, right eye: Secondary | ICD-10-CM | POA: Diagnosis not present

## 2021-07-30 DIAGNOSIS — E103593 Type 1 diabetes mellitus with proliferative diabetic retinopathy without macular edema, bilateral: Secondary | ICD-10-CM | POA: Diagnosis not present

## 2021-07-30 DIAGNOSIS — H47293 Other optic atrophy, bilateral: Secondary | ICD-10-CM | POA: Diagnosis not present

## 2021-07-30 DIAGNOSIS — Z961 Presence of intraocular lens: Secondary | ICD-10-CM | POA: Diagnosis not present

## 2021-07-30 DIAGNOSIS — H35373 Puckering of macula, bilateral: Secondary | ICD-10-CM | POA: Diagnosis not present

## 2021-07-31 ENCOUNTER — Other Ambulatory Visit: Payer: Self-pay | Admitting: Cardiovascular Disease

## 2021-07-31 DIAGNOSIS — I10 Essential (primary) hypertension: Secondary | ICD-10-CM

## 2021-07-31 DIAGNOSIS — I25719 Atherosclerosis of autologous vein coronary artery bypass graft(s) with unspecified angina pectoris: Secondary | ICD-10-CM

## 2021-07-31 DIAGNOSIS — E785 Hyperlipidemia, unspecified: Secondary | ICD-10-CM

## 2021-08-04 DIAGNOSIS — F429 Obsessive-compulsive disorder, unspecified: Secondary | ICD-10-CM | POA: Diagnosis not present

## 2021-08-06 DIAGNOSIS — E109 Type 1 diabetes mellitus without complications: Secondary | ICD-10-CM | POA: Diagnosis not present

## 2021-08-20 ENCOUNTER — Other Ambulatory Visit: Payer: Self-pay | Admitting: *Deleted

## 2021-08-20 MED ORDER — TEGRETOL-XR 200 MG PO TB12: ORAL_TABLET | ORAL | 3 refills | Status: DC

## 2021-08-25 DIAGNOSIS — F429 Obsessive-compulsive disorder, unspecified: Secondary | ICD-10-CM | POA: Diagnosis not present

## 2021-09-23 ENCOUNTER — Other Ambulatory Visit: Payer: Self-pay

## 2021-09-23 ENCOUNTER — Ambulatory Visit: Payer: Medicare PPO | Admitting: Cardiovascular Disease

## 2021-09-23 ENCOUNTER — Encounter: Payer: Self-pay | Admitting: Cardiovascular Disease

## 2021-09-23 VITALS — BP 134/64 | HR 81 | Ht 71.0 in | Wt 232.8 lb

## 2021-09-23 DIAGNOSIS — E782 Mixed hyperlipidemia: Secondary | ICD-10-CM | POA: Diagnosis not present

## 2021-09-23 DIAGNOSIS — I25119 Atherosclerotic heart disease of native coronary artery with unspecified angina pectoris: Secondary | ICD-10-CM

## 2021-09-23 DIAGNOSIS — I10 Essential (primary) hypertension: Secondary | ICD-10-CM

## 2021-09-23 DIAGNOSIS — I25719 Atherosclerosis of autologous vein coronary artery bypass graft(s) with unspecified angina pectoris: Secondary | ICD-10-CM | POA: Diagnosis not present

## 2021-09-23 MED ORDER — NITROGLYCERIN 0.4 MG SL SUBL: 0.4000 mg | SUBLINGUAL_TABLET | SUBLINGUAL | 5 refills | Status: DC | PRN

## 2021-09-23 NOTE — Patient Instructions (Signed)
Medication Instructions:  ?Your physician recommends that you continue on your current medications as directed. Please refer to the Current Medication list given to you today. ? ?*If you need a refill on your cardiac medications before your next appointment, please call your pharmacy* ? ? ?Lab Work: ?NONE ?If you have labs (blood work) drawn today and your tests are completely normal, you will receive your results only by: ?MyChart Message (if you have MyChart) OR ?A paper copy in the mail ?If you have any lab test that is abnormal or we need to change your treatment, we will call you to review the results. ? ? ?Testing/Procedures: ?NONE ? ? ?Follow-Up: ?At [facility], you and your health needs are our priority.  As part of our continuing mission to provide you with exceptional heart care, we have created designated Provider Care Teams.  These Care Teams include your primary Cardiologist (physician) and Advanced Practice Providers (APPs -  Physician Assistants and Nurse Practitioners) who all work together to provide you with the care you need, when you need it. ? ?Your next appointment:   ?6 month(s) ? ?The format for your next appointment:   ?In Person ? ?Provider:   ?Michael Cooper, MD   ?  ?

## 2021-09-23 NOTE — Progress Notes (Signed)
?Cardiology Office Note:   ? ?Date:  09/23/2021  ? ?ID:  Jeff [name], DOB 04-01-1966, MRN [id number] ? ?PCP:  Crist Infante, MD ?  ?[facility] Providers ?Cardiologist:  Sherren Mocha, MD    ? ?Referring MD: Crist Infante, MD  ? ?Chief Complaint  ?Patient presents with  ? Coronary Artery Disease  ? ? ?History of Present Illness:   ? ?Jeff [name] is a 56 y.o. male with a hx of coronary artery disease, presenting for follow-up evaluation.  The patient underwent multivessel CABG in 2706 complicated by early bypass graft failure.  He underwent multivessel stenting shortly after bypass surgery.  Most recent heart catheterization in 2014 demonstrated patency of his stent sites with a coronary disease pattern involving diffuse small vessel disease.  Medical therapy was recommended.  LV function has been normal.  ? ?The patient is here with his wife today.  He denies any new cardiac symptoms.  He has no chest pain, chest pressure, shortness of breath, edema, or heart palpitations.  His medications are unchanged.  He reports that Praluent is no longer preferred with his insurance.  He may need to switch to Repatha.  He is treated through the [place] office and he sees Dr. Joylene Draft later this week.  He will address this at that time.  The patient has been sedentary and he no longer is walking.  He used to be engaged in the maintenance phase of cardiac rehab and he really enjoyed that.  However, since this program is no longer offered, he has fallen out of the practice of any regular exercise.  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 ? ?Past Medical History:  ?Diagnosis Date  ? Anxiety   ? CAD (coronary artery disease)   ? Cath 2011.  LAD stent patent, distal LAD occlusion, D1 100%, OM branch 80 - 90%, SVG to diag patent, LIMA to LAD occluded, SVG to RCA occluded, SVG to OM was occluded.   No change from previous cath.  Managed medically.   ? Cataract   ?  Depression   ? Diabetes mellitus type I ([place])   ? on insulin pump at  home  ? Diabetic retinopathy   ? s/p vitrectomy and history of retinal surgery  ? Hemorrhoid   ? History of oxygen administration   ? nocutural use only at 2 l/m nasally.  ? HTN (hypertension)   ? Hyperlipidemia   ? Myocardial infarction [facility])   ? Neurofibromatosis   ? 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 Seasonal allergic rhinitis   ? Seizure disorder ([place])   ? Sleep apnea   ? s/p oral surgery  ? Toe fracture, right   ? 11-23-14 fracture right big toe-wearing a stability shoe  ? ? ?Past Surgical History:  ?Procedure Laterality Date  ? APPENDECTOMY    ? CARDIAC CATHETERIZATION    ? CATARACT EXTRACTION, BILATERAL    ? COLONOSCOPY WITH PROPOFOL N/A 07/21/2017  ? Procedure: COLONOSCOPY WITH PROPOFOL;  Surgeon: Milus Banister, MD;  Location: [facility];  Service: Endoscopy;  Laterality: N/A;  ? CORONARY ANGIOPLASTY    ? 9'11,  CABPG grafts had failed.  ? CORONARY ARTERY BYPASS GRAFT    ? 4 vessel 4/11  ? ESOPHAGOGASTRODUODENOSCOPY (EGD) WITH PROPOFOL N/A 12/05/2014  ? Procedure: ESOPHAGOGASTRODUODENOSCOPY (EGD) WITH PROPOFOL;  Surgeon: Milus Banister, MD;  Location: [facility];  Service: Endoscopy;  Laterality: N/A;  ?  LAPAROSCOPIC APPENDECTOMY  11/24/2011  ? LEFT HEART CATHETERIZATION WITH CORONARY/GRAFT ANGIOGRAM N/A 02/22/2013  ? Procedure: LEFT HEART CATHETERIZATION WITH Beatrix Fetters;  Surgeon: Sherren Mocha, MD;  Location[facility] CATH LAB;  Service: Cardiovascular;  Laterality: N/A;  ? release of right transverse carpal ligament    ? right eye vitrectomy and detached retina repair  10/2007  ? TONSILLECTOMY    ? uvulopalatoplasty surgery    ? ? ?Current Medications: ?Current Meds  ?Medication Sig  ? ACCU-CHEK GUIDE test strip   ? Accu-Chek Softclix Lancets lancets   ? acetaminophen (TYLENOL) 500 MG tablet Take 1,000 mg by mouth every 6 (six) hours as needed for mild pain.  ? Alirocumab (PRALUENT) 150 MG/ML SOAJ Inject into  the skin. Per patient taking every other week  ? aspirin 81 MG tablet Take 81 mg by mouth daily.  ? Blood Glucose Monitoring Suppl (ACCU-CHEK GUIDE) w/Device KIT   ? Cholecalciferol (VITAMIN D) 2000 units CAPS Take 2,000 Units by mouth daily.   ? cyanocobalamin 1000 MCG tablet Take 1,000 mcg by mouth daily.  ? desvenlafaxine (PRISTIQ) 100 MG 24 hr tablet Take 100 mg by mouth daily.  ? docusate sodium (COLACE) 100 MG capsule Take 100 mg by mouth at bedtime.  ? ezetimibe (ZETIA) 10 MG tablet Take 10 mg by mouth at bedtime.  ? isosorbide mononitrate (IMDUR) 60 MG 24 hr tablet Take 60 mg by mouth every morning.   ? levETIRAcetam (KEPPRA) 750 MG tablet TAKE 1 TABLET(750 MG) BY MOUTH TWICE DAILY  ? lisinopril (PRINIVIL,ZESTRIL) 5 MG tablet Take 5 mg by mouth every morning.   ? metoprolol tartrate (LOPRESSOR) 25 MG tablet TAKE 1 TABLET(25 MG) BY MOUTH TWICE DAILY  ? Multiple Vitamins-Minerals (CENTRUM SILVER 50+MEN) TABS Take 1 tablet by mouth every morning.  ? NOVOLOG 100 UNIT/ML injection   ? OXYGEN Place 2 L into the nose at bedtime.  ? PRALUENT 150 MG/ML SOAJ Inject 150 mg into the skin.  ? rosuvastatin (CRESTOR) 40 MG tablet Take 40 mg by mouth at bedtime.   ? TEGRETOL-XR 200 MG 12 hr tablet TAKE 3 TABLETS BY MOUTH TWO TIMES A DAY.  ? [DISCONTINUED] nitroGLYCERIN (NITROSTAT) 0.4 MG SL tablet Place 1 tablet (0.4 mg total) under the tongue every 5 (five) minutes as needed for chest pain.  ?  ? ?Allergies:   Latex  ? ?Social History  ? ?Socioeconomic History  ? Marital status: Married  ?  Spouse name: [name]  ? Number of children: 0  ? Years of education: college  ? Highest education level: Not on file  ?Occupational History  ? Occupation: Unemployed  ?  Comment: previously worked in [employer]  ?Tobacco Use  ? Smoking status: Never  ? Smokeless tobacco: Never  ?Vaping Use  ? Vaping Use: Never used  ?Substance and Sexual Activity  ? Alcohol use: No  ? Drug use: No  ? Sexual activity: Yes  ?Other Topics Concern  ? Not on file   ?Social History Narrative  ? Patient lives with his wifeBenjamine Mola).  ? Patient does not have any children.  ? Patient is on disability.  ? Patient is left-handed (writes), but right hand with sports.  ? Patient has a college education.  ? Patient drinks 5-6 cups of caffeine daily.  ? ?Social Determinants of Health  ? ?Financial Resource Strain: Not on file  ?Food Insecurity: Not on file  ?Transportation Needs: Not on file  ?Physical Activity: Not on file  ?Stress: Not on file  ?  Social Connections: Not on file  ?  ? ?Family History: ?The patient's family history includes Arthritis in his sister; Coronary artery disease (age of onset: 55) in his father; Crohn's disease in his mother; Diabetes in his father; Hearing loss in his mother; Hypertension in his father; Squamous cell carcinoma in his brother. ? ?ROS:   ?Please see the history of present illness.    ?All other systems reviewed and are negative. ? ?EKGs/Labs/Other Studies Reviewed:   ? ?The following studies were reviewed today: ?Myocardial Perfusion Study 2019: ?Nuclear stress EF: 70%. ?The left ventricular ejection fraction is hyperdynamic (>65%). ?There was no ST segment deviation noted during stress. Transient second degree and third degree AV block were noted during infusion. ?The study is normal. ?This is a low risk study. ? ?Echo 2018: ?- Left ventricle: The cavity size was normal. There was mild  ?  concentric hypertrophy. Systolic function was vigorous. The  ?  estimated ejection fraction was in the range of 65% to 70%. Wall  ?  motion was normal; there were no regional wall motion  ?  abnormalities. Doppler parameters are consistent with abnormal  ?  left ventricular relaxation (grade 1 diastolic dysfunction).  ?- Ventricular septum: Septal motion showed paradox. These changes  ?  are consistent with a post-thoracotomy state.  ?- Mitral valve: Calcified annulus.  ?- Left atrium: The atrium was mildly dilated.  ? ?EKG:  EKG is ordered today.  The  ekg ordered today demonstrates NSR 81 bpm, RBBB, no change from prior ? ?Recent Labs: ?04/22/2021: ALT 32; BUN 9; Creatinine, Ser 0.79; Hemoglobin 15.1; Platelets 194; Potassium 4.9; Sodium 141  ?Recent Lipid

## 2021-09-24 DIAGNOSIS — F429 Obsessive-compulsive disorder, unspecified: Secondary | ICD-10-CM | POA: Diagnosis not present

## 2021-09-25 DIAGNOSIS — Z794 Long term (current) use of insulin: Secondary | ICD-10-CM | POA: Diagnosis not present

## 2021-09-25 DIAGNOSIS — I2510 Atherosclerotic heart disease of native coronary artery without angina pectoris: Secondary | ICD-10-CM | POA: Diagnosis not present

## 2021-09-25 DIAGNOSIS — E785 Hyperlipidemia, unspecified: Secondary | ICD-10-CM | POA: Diagnosis not present

## 2021-09-25 DIAGNOSIS — E1040 Type 1 diabetes mellitus with diabetic neuropathy, unspecified: Secondary | ICD-10-CM | POA: Diagnosis not present

## 2021-09-25 DIAGNOSIS — Z4681 Encounter for fitting and adjustment of insulin pump: Secondary | ICD-10-CM | POA: Diagnosis not present

## 2021-09-25 DIAGNOSIS — I10 Essential (primary) hypertension: Secondary | ICD-10-CM | POA: Diagnosis not present

## 2021-09-25 DIAGNOSIS — E1039 Type 1 diabetes mellitus with other diabetic ophthalmic complication: Secondary | ICD-10-CM | POA: Diagnosis not present

## 2021-09-25 DIAGNOSIS — M8580 Other specified disorders of bone density and structure, unspecified site: Secondary | ICD-10-CM | POA: Diagnosis not present

## 2021-09-25 DIAGNOSIS — G4733 Obstructive sleep apnea (adult) (pediatric): Secondary | ICD-10-CM | POA: Diagnosis not present

## 2021-09-29 ENCOUNTER — Ambulatory Visit: Payer: Medicare PPO | Admitting: Podiatry

## 2021-10-12 ENCOUNTER — Ambulatory Visit: Payer: Medicare PPO | Admitting: Podiatrist

## 2021-10-12 DIAGNOSIS — G629 Polyneuropathy, unspecified: Secondary | ICD-10-CM | POA: Diagnosis not present

## 2021-10-12 DIAGNOSIS — M205X1 Other deformities of toe(s) (acquired), right foot: Secondary | ICD-10-CM | POA: Diagnosis not present

## 2021-10-12 DIAGNOSIS — M216X9 Other acquired deformities of unspecified foot: Secondary | ICD-10-CM | POA: Diagnosis not present

## 2021-10-12 DIAGNOSIS — E119 Type 2 diabetes mellitus without complications: Secondary | ICD-10-CM

## 2021-10-12 NOTE — Patient Instructions (Signed)
Our office will call when we have the paperwork signed and can schedule you for a diabetic shoe fitting. ? ? ?

## 2021-10-18 ENCOUNTER — Encounter: Payer: Self-pay | Admitting: Podiatrist

## 2021-10-18 NOTE — Progress Notes (Signed)
?Chief Complaint  ?Patient presents with  ? Diabetes  ?  Diabetic foot care/exam  ?  ? ?HPI: Patient is 56 y.o. male who presents today for an annual diabetic foot examination. He denies any pain, numbness or tingling in his feet or calves.  Denies any claudication type symptoms.   ? ?Patient Active Problem List  ? Diagnosis Date Noted  ? Special screening for malignant neoplasms, colon   ? Diverticulosis of colon without hemorrhage   ? Toe fracture, right   ? Sleep apnea   ? Seizure disorder ([place])   ? Seasonal allergic rhinitis   ? Myocardial infarction [facility])   ? Hyperlipidemia   ? History of oxygen administration   ? Hemorrhoid   ? Diabetes mellitus type I ([place])   ? Depression   ? Cataract   ? CAD (coronary artery disease)   ? Anxiety   ? Odynophagia 11/28/2014  ? 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 HTN (hypertension) 03/31/2014  ? Partial epilepsy with impairment of consciousness ([place]) 04/24/2013  ? 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 Pain in limb 04/24/2013  ? Encounter for therapeutic drug monitoring 04/24/2013  ? Postop check 12/08/2011  ? Leg pain, left 12/17/2010  ? HYPERLIPIDEMIA-MIXED 11/28/2009  ? ACUT MI SUBENDOCARDIAL INFARCT SUBSQT EPIS CARE 11/28/2009  ? CORONARY ATHEROSLERO AUTOL VEIN BYPASS GRAFT 11/28/2009  ? ? ?Current Outpatient Medications on File Prior to Visit  ?Medication Sig Dispense Refill  ? ACCU-CHEK GUIDE test strip     ? Accu-Chek Softclix Lancets lancets     ? acetaminophen (TYLENOL) 500 MG tablet Take 1,000 mg by mouth every 6 (six) hours as needed for mild pain.    ? ADVOCATE INSULIN SYRINGE 31G X 5/16" 1 ML MISC     ? Alirocumab (PRALUENT) 150 MG/ML SOAJ Inject into the skin. Per patient taking every other week    ? aspirin 81 MG tablet Take 81 mg by mouth daily.    ? Blood Glucose Monitoring Suppl (ACCU-CHEK GUIDE) w/Device KIT     ? Cholecalciferol (VITAMIN D) 2000 units CAPS Take 2,000 Units by mouth daily.     ?  cyanocobalamin 1000 MCG tablet Take 1,000 mcg by mouth daily.    ? desvenlafaxine (PRISTIQ) 100 MG 24 hr tablet Take 100 mg by mouth daily.    ? docusate sodium (COLACE) 100 MG capsule Take 100 mg by mouth at bedtime.    ? ezetimibe (ZETIA) 10 MG tablet Take 10 mg by mouth at bedtime.    ? isosorbide mononitrate (IMDUR) 60 MG 24 hr tablet Take 60 mg by mouth every morning.     ? levETIRAcetam (KEPPRA) 750 MG tablet TAKE 1 TABLET(750 MG) BY MOUTH TWICE DAILY 180 tablet 3  ? lisinopril (PRINIVIL,ZESTRIL) 5 MG tablet Take 5 mg by mouth every morning.     ? metoprolol tartrate (LOPRESSOR) 25 MG tablet TAKE 1 TABLET(25 MG) BY MOUTH TWICE DAILY 180 tablet 0  ? Multiple Vitamins-Minerals (CENTRUM SILVER 50+MEN) TABS Take 1 tablet by mouth every morning.    ? nitroGLYCERIN (NITROSTAT) 0.4 MG SL tablet Place 1 tablet (0.4 mg total) under the tongue every 5 (five) minutes as needed for chest pain. 25 tablet 5  ? NOVOLOG 100 UNIT/ML injection     ? OXYGEN Place 2 L into the nose at bedtime.    ? PRALUENT 150 MG/ML SOAJ Inject 150 mg into the skin.    ? rosuvastatin (CRESTOR) 40 MG  tablet Take 40 mg by mouth at bedtime.     ? TEGRETOL-XR 200 MG 12 hr tablet TAKE 3 TABLETS BY MOUTH TWO TIMES A DAY. 540 tablet 3  ? ?No current facility-administered medications on file prior to visit.  ? ? ?Allergies  ?Allergen Reactions  ? Aspirin   ?  Other reaction(s): Unknown  ? Doxepin Hcl   ?  Other reaction(s): did not work and had some extra nocturia  ? Venlafaxine   ?  Other reaction(s): Too much sedation  ? Latex Rash  ? ? ?Review of Systems ?No fevers, chills, nausea, muscle aches, no difficulty breathing, no calf pain, no chest pain or shortness of breath. ? ? ?Physical Exam ? ?GENERAL APPEARANCE: Alert, conversant. Appropriately groomed. No acute distress.  ? ?VASCULAR: Pedal pulses palpable DP and PT bilateral.  Capillary refill time is immediate to all digits,  Proximal to distal cooling it warm to warm.  Digital perfusion adequate.   ? ?NEUROLOGIC: sensation is intact to 5.07 monofilament at 2/5 sites bilateral.  Light touch is intact bilateral, vibratory sensation decreased bilateral ? ?MUSCULOSKELETAL: acceptable muscle strength, tone and stability bilateral.  High arch foot with prominent metatarsals and contracture of digits is seen bilateral. Hallux contacture present right greater than left.   No pain, crepitus or limitation noted with foot and ankle range of motion bilateral.  ? ?DERMATOLOGIC: skin is warm, supple, and dry.  Color, texture, and turgor of skin within normal limits.  Callus present medial first metatarsal head bilateral.  Digital nails are asymptomatic.   ? ?No xrays  ? ?Assessment  ? ?  ICD-10-CM   ?1. Encounter for diabetic foot exam ([place])  E11.9   ?  ?2. Neuropathy  G62.9   ?  ?3. Contracture of toe of right foot  M20.5X1   ?  ?4. Cavus deformity of foot, acquired  M21.6X9   ?  ? ? ? ? ? ?Plan ? ?Discussed exam findings and general principles for foot health in diabetes.  Discussed supportive shoe gear recommendations.  Discussed the positive long term benefits of diabetic shoes due to his cavus foot type and digital contractures as well as his neuropathy.  We will start the paperwork for the shoes and call when he is ready for measurements.  If any questions arise he will call   ?

## 2021-10-28 ENCOUNTER — Other Ambulatory Visit: Payer: Self-pay | Admitting: Cardiovascular Disease

## 2021-10-28 DIAGNOSIS — I25719 Atherosclerosis of autologous vein coronary artery bypass graft(s) with unspecified angina pectoris: Secondary | ICD-10-CM

## 2021-10-28 DIAGNOSIS — E785 Hyperlipidemia, unspecified: Secondary | ICD-10-CM

## 2021-10-28 DIAGNOSIS — I10 Essential (primary) hypertension: Secondary | ICD-10-CM

## 2021-11-09 ENCOUNTER — Telehealth: Payer: Self-pay

## 2021-11-09 NOTE — Telephone Encounter (Signed)
CMN submitted ?

## 2021-11-12 ENCOUNTER — Ambulatory Visit: Payer: Medicare PPO

## 2021-11-12 DIAGNOSIS — L84 Corns and callosities: Secondary | ICD-10-CM

## 2021-11-12 DIAGNOSIS — E1069 Type 1 diabetes mellitus with other specified complication: Secondary | ICD-10-CM

## 2021-11-12 DIAGNOSIS — M2041 Other hammer toe(s) (acquired), right foot: Secondary | ICD-10-CM

## 2021-11-12 NOTE — Progress Notes (Signed)
SITUATION Reason for Consult: Evaluation for Prefabricated Diabetic Shoes and Custom Diabetic Inserts. Patient / Caregiver Report: Patient would like well fitting shoes  OBJECTIVE DATA: Patient History / Diagnosis:    ICD-10-CM   1. Type 1 diabetes mellitus with other specified complication (HCC)  E10.69     2. Hammertoe, bilateral  M20.41    M20.42     3. Callus of foot  L84       Physician Treating Diabetes:  Rodrigo Ran, MD  Current or Previous Devices:   Historical user  In-Person Foot Examination: Ulcers & Callousing:   Historical callusing Deformities:    Hammertoes Sensation:    Compromised  Shoe Size:     9.5XW  ORTHOTIC RECOMMENDATION Recommended Devices: - 1x pair prefabricated PDAC approved diabetic shoes; Patient Selected Orthofeet Sprint Blue 674 Size 9.5XW - 3x pair custom-to-patient PDAC approved vacuum formed diabetic insoles.  GOALS OF SHOES AND INSOLES - Reduce shear and pressure - Reduce / Prevent callus formation - Reduce / Prevent ulceration - Protect the fragile healing compromised diabetic foot.  Patient would benefit from diabetic shoes and inserts as patient has diabetes mellitus and the patient has one or more of the following conditions: - History of partial or complete amputation of the foot - History of previous foot ulceration. - History of pre-ulcerative callus - Peripheral neuropathy with evidence of callus formation - Foot deformity - Poor circulation  ACTIONS PERFORMED Potential out of pocket cost was communicated to patient. Patient understood and consented to measurement and casting. Patient was casted for insoles via crush box and measured for shoes via brannock device. Procedure was explained and patient tolerated procedure well. All questions were answered and concerns addressed. CMN resubmitted to treating physician. Casts were shipped to central fabrication for HOLD until Certificate of Medical Necessity or otherwise necessary  authorization from insurance is obtained.  PLAN Shoes are to be ordered and casts released from hold once all appropriate paperwork is complete. Patient is to be contacted and scheduled for fitting once shoes and insoles have been fabricated and received.

## 2021-11-16 ENCOUNTER — Telehealth: Payer: Self-pay

## 2021-11-16 NOTE — Telephone Encounter (Signed)
CMN Received - Shoes ordered and casts released from fabrication hold  Orthofeet Sprint Blue - 9.5XW

## 2021-11-19 DIAGNOSIS — F429 Obsessive-compulsive disorder, unspecified: Secondary | ICD-10-CM | POA: Diagnosis not present

## 2021-11-27 ENCOUNTER — Telehealth: Payer: Self-pay

## 2021-11-27 NOTE — Telephone Encounter (Signed)
CMN Received - Shoes ordered and casts released  Orthofeet Sprint Blue 674 9.5XW

## 2021-12-01 DIAGNOSIS — Z794 Long term (current) use of insulin: Secondary | ICD-10-CM | POA: Diagnosis not present

## 2021-12-01 DIAGNOSIS — I2510 Atherosclerotic heart disease of native coronary artery without angina pectoris: Secondary | ICD-10-CM | POA: Diagnosis not present

## 2021-12-01 DIAGNOSIS — E1039 Type 1 diabetes mellitus with other diabetic ophthalmic complication: Secondary | ICD-10-CM | POA: Diagnosis not present

## 2021-12-01 DIAGNOSIS — Z4681 Encounter for fitting and adjustment of insulin pump: Secondary | ICD-10-CM | POA: Diagnosis not present

## 2021-12-01 DIAGNOSIS — I10 Essential (primary) hypertension: Secondary | ICD-10-CM | POA: Diagnosis not present

## 2022-01-05 DIAGNOSIS — E109 Type 1 diabetes mellitus without complications: Secondary | ICD-10-CM | POA: Diagnosis not present

## 2022-01-12 ENCOUNTER — Ambulatory Visit (INDEPENDENT_AMBULATORY_CARE_PROVIDER_SITE_OTHER): Payer: Medicare PPO

## 2022-01-12 DIAGNOSIS — F429 Obsessive-compulsive disorder, unspecified: Secondary | ICD-10-CM | POA: Diagnosis not present

## 2022-01-12 DIAGNOSIS — M2042 Other hammer toe(s) (acquired), left foot: Secondary | ICD-10-CM | POA: Diagnosis not present

## 2022-01-12 DIAGNOSIS — M2041 Other hammer toe(s) (acquired), right foot: Secondary | ICD-10-CM | POA: Diagnosis not present

## 2022-01-12 DIAGNOSIS — E1069 Type 1 diabetes mellitus with other specified complication: Secondary | ICD-10-CM

## 2022-01-12 NOTE — Progress Notes (Signed)
Patient presents today to pick up diabetic shoes and insoles.  Patient was dispensed 1 pair of diabetic shoes and 3 pairs of foam casted diabetic insoles. Fit was satisfactory. Instructions for break-in and wear was reviewed and a copy was given to the patient.   Re-appointment for regularly scheduled diabetic foot care visits or if they should experience any trouble with the shoes or insoles.  

## 2022-01-20 ENCOUNTER — Telehealth: Payer: Self-pay | Admitting: Podiatry

## 2022-01-20 NOTE — Telephone Encounter (Signed)
Patient picked up  his new diabetic shoes and they are making his feet tingle, does he need to bring them back to be adjusted ? Or does he need to come in to be seen  by Dr

## 2022-01-25 ENCOUNTER — Other Ambulatory Visit: Payer: Medicare PPO

## 2022-01-28 ENCOUNTER — Other Ambulatory Visit: Payer: Self-pay | Admitting: Cardiovascular Disease

## 2022-01-28 DIAGNOSIS — I25719 Atherosclerosis of autologous vein coronary artery bypass graft(s) with unspecified angina pectoris: Secondary | ICD-10-CM

## 2022-01-28 DIAGNOSIS — E785 Hyperlipidemia, unspecified: Secondary | ICD-10-CM

## 2022-01-28 DIAGNOSIS — I10 Essential (primary) hypertension: Secondary | ICD-10-CM

## 2022-02-05 DIAGNOSIS — F429 Obsessive-compulsive disorder, unspecified: Secondary | ICD-10-CM | POA: Diagnosis not present

## 2022-02-11 ENCOUNTER — Ambulatory Visit: Payer: Medicare PPO | Admitting: Podiatry

## 2022-02-11 DIAGNOSIS — G629 Polyneuropathy, unspecified: Secondary | ICD-10-CM | POA: Diagnosis not present

## 2022-02-11 DIAGNOSIS — E1069 Type 1 diabetes mellitus with other specified complication: Secondary | ICD-10-CM | POA: Diagnosis not present

## 2022-02-15 NOTE — Progress Notes (Signed)
  Subjective:  Patient ID: Jeff [name], male    DOB: 1965-07-09,  MRN: [id number]  Chief Complaint  Patient presents with   Nail Problem    Thick painful toenails, 4    month follow up    Diabetes    Also needs to see Christan today, he brought his diabetic shoes with him    56 y.o. male presents with the above complaint. History confirmed with patient.  His wife helps him with his toenails and these are fairly manageable for him.  He began wearing his new diabetic shoes but notices something did not quite feel right was not sure if it was the way it fit or if it was the material.  Most of this seems to be improving.  Objective:  Physical Exam: warm, good capillary refill, no trophic changes or ulcerative lesions, normal DP and PT pulses, and thickening of the nails with some dystrophy, no evidence of preulcerative callus or preulcerative lesions.  Assessment:   1. Type 1 diabetes mellitus with other specified complication (HCC)   2. Neuropathy      Plan:  Patient was evaluated and treated and all questions answered.  Patient educated on diabetes. Discussed proper diabetic foot care and discussed risks and complications of disease. Educated patient in depth on reasons to return to the office immediately should he/she discover anything concerning or new on the feet. All questions answered. Discussed proper shoes as well.   He is going to try his shoes for a little while longer to see if further break-in can help to alleviate this.  If not would need to have new shoes fitted.  Discussed we can do this at [facility] in [facility], he has had issues from [facility] medical supply in [place] before and these were helpful but he is unsure that he wants to go back there.  He will let me know how he would like to proceed  Return if symptoms worsen or fail to improve.

## 2022-02-22 ENCOUNTER — Telehealth: Payer: Self-pay | Admitting: Neurology

## 2022-02-22 NOTE — Telephone Encounter (Signed)
Pt states this is the 2nd time he has run into this scenario of having more Keppra than .  Pt is reporting that as of today he has enough TEGRETOL until Sunday and re: the Keppra he has a few more days worth past Sunday of next week.  Pt states the Rx is available for a refill a week from today. for the TEGRETOL .  Pt is asking for a call from RN to discuss what can be done about having the same amount of pills needed for each medication. Message being sent to POD 2 for Maralyn Sago, NP

## 2022-02-23 NOTE — Telephone Encounter (Signed)
I called the pt in regards to the message. He his Tegretol medication is set to run out on 02/28/2022 and he will not be able to pick up his med until Monday.   Pt is concerned that he will have an issue filling his med come Monday and b/c of shortages within the pharmacy and wanted to know what his options were. I advised the we would do a short term supply to another pharmacy and he use the [facility] or we could wait to see if his pharmacy can override and give the med a few days early. I advised I would reach out to the pharmacy and see about this .

## 2022-02-25 NOTE — Telephone Encounter (Signed)
I called the pharmacy and spoke Ocie Bob with he confirmed the refill for the Tegretol would be available for partial pick up today or full pick up tomorrow. I advised the pharmacy the pt would prefer to pick the full dosage up and to wait until the full 540 is able to be given.   Ocie Bob verbalized understanding on this and I called and updated the pt as well.  He will wait until tomorrow to have this filled.

## 2022-03-11 DIAGNOSIS — F429 Obsessive-compulsive disorder, unspecified: Secondary | ICD-10-CM | POA: Diagnosis not present

## 2022-03-12 ENCOUNTER — Encounter: Payer: Self-pay | Admitting: Cardiovascular Disease

## 2022-03-12 ENCOUNTER — Ambulatory Visit: Payer: Medicare PPO | Attending: Cardiovascular Disease | Admitting: Cardiovascular Disease

## 2022-03-12 VITALS — BP 122/76 | HR 88 | Ht 71.0 in | Wt 231.6 lb

## 2022-03-12 DIAGNOSIS — E782 Mixed hyperlipidemia: Secondary | ICD-10-CM | POA: Diagnosis not present

## 2022-03-12 DIAGNOSIS — I10 Essential (primary) hypertension: Secondary | ICD-10-CM

## 2022-03-12 DIAGNOSIS — I25119 Atherosclerotic heart disease of native coronary artery with unspecified angina pectoris: Secondary | ICD-10-CM

## 2022-03-12 NOTE — Patient Instructions (Signed)
Medication Instructions:  Your physician recommends that you continue on your current medications as directed. Please refer to the Current Medication list given to you today.  *If you need a refill on your cardiac medications before your next appointment, please call your pharmacy*   Lab Work: NONE If you have labs (blood work) drawn today and your tests are completely normal, you will receive your results only by: MyChart Message (if you have MyChart) OR A paper copy in the mail If you have any lab test that is abnormal or we need to change your treatment, we will call you to review the results.   Testing/Procedures: NONE   Follow-Up: At [facility], you and your health needs are our priority.  As part of our continuing mission to provide you with exceptional heart care, we have created designated Provider Care Teams.  These Care Teams include your primary Cardiologist (physician) and Advanced Practice Providers (APPs -  Physician Assistants and Nurse Practitioners) who all work together to provide you with the care you need, when you need it.  Your next appointment:   6 months  The format for your next appointment:   In Person  Provider:   Michael Cooper, MD       Important Information About Sugar       

## 2022-03-12 NOTE — Progress Notes (Signed)
Cardiology Office Note:    Date:  03/12/2022   ID:  Jeff [name], DOB 03/28/1966, MRN [id number]  PCP:  Crist Infante, [place] Providers Cardiologist:  Sherren Mocha, MD     Referring MD: Crist Infante, MD   Chief Complaint  Patient presents with   Coronary Artery Disease    History of Present Illness:    Jeff [name] is a 56 y.o. male with a hx of coronary artery disease, presenting for follow-up evaluation.  The patient underwent multivessel CABG in 3734 complicated by early bypass graft failure.  He underwent multivessel stenting shortly after bypass surgery.  Most recent heart catheterization in 2014 demonstrated patency of his stent sites with a coronary disease pattern involving diffuse small vessel disease.  Medical therapy was recommended.  LV function has been normal.   The patient is here alone today. He seems to be doing well from a cardiac perspective. He has some concerns about whether long-acting nitrates could be affecting his retinopathy. I reviewed Micromedix and don't see any warnings regarding this. He denies any recent chest pain or pressure with exertion. No dypsnea, edema, or palpitations.  He really is not getting much exercise.  We talked about this at length today.  He is not able to go to the maintenance phase of cardiac rehab.  Cost limits his ability to go to a gym.  We talked about finding a friend or 2 that he could walk with outdoors but his wife has concerns about his vision and fall risk.  Past Medical History:  Diagnosis Date   Anxiety    CAD (coronary artery disease)    Cath 2011.  LAD stent patent, distal LAD occlusion, D1 100%, OM branch 80 - 90%, SVG to diag patent, LIMA to LAD occluded, SVG to RCA occluded, SVG to OM was occluded.   No change from previous cath.  Managed medically.    Cataract    Depression    Diabetes mellitus type I ([place])    on insulin pump at  home   Diabetic retinopathy    s/p vitrectomy and history of  retinal surgery   Hemorrhoid    History of oxygen administration    nocutural use only at 2 l/m nasally.   HTN (hypertension)    Hyperlipidemia    Myocardial infarction [facility])    Neurofibromatosis    with neurofibroma lesion at the base of the skull   Seasonal allergic rhinitis    Seizure disorder (HCC)    Sleep apnea    s/p oral surgery   Toe fracture, right    11-23-14 fracture right big toe-wearing a stability shoe    Past Surgical History:  Procedure Laterality Date   APPENDECTOMY     CARDIAC CATHETERIZATION     CATARACT EXTRACTION, BILATERAL     COLONOSCOPY WITH PROPOFOL N/A 07/21/2017   Procedure: COLONOSCOPY WITH PROPOFOL;  Surgeon: Milus Banister, MD;  Location: [facility] ENDOSCOPY;  Service: Endoscopy;  Laterality: N/A;   CORONARY ANGIOPLASTY     9'11,  CABPG grafts had failed.   CORONARY ARTERY BYPASS GRAFT     4 vessel 4/11   ESOPHAGOGASTRODUODENOSCOPY (EGD) WITH PROPOFOL N/A 12/05/2014   Procedure: ESOPHAGOGASTRODUODENOSCOPY (EGD) WITH PROPOFOL;  Surgeon: Milus Banister, MD;  Location: [facility] ENDOSCOPY;  Service: Endoscopy;  Laterality: N/A;   LAPAROSCOPIC APPENDECTOMY  11/24/2011   LEFT HEART CATHETERIZATION WITH CORONARY/GRAFT ANGIOGRAM N/A 02/22/2013   Procedure: LEFT HEART CATHETERIZATION WITH CORONARY/GRAFT ANGIOGRAM;  Surgeon:  Sherren Mocha, MD;  Location: [facility] CATH LAB;  Service: Cardiovascular;  Laterality: N/A;   release of right transverse carpal ligament     right eye vitrectomy and detached retina repair  10/2007   TONSILLECTOMY     uvulopalatoplasty surgery      Current Medications: Current Meds  Medication Sig   ACCU-CHEK GUIDE test strip    Accu-Chek Softclix Lancets lancets    acetaminophen (TYLENOL) 500 MG tablet Take 1,000 mg by mouth every 6 (six) hours as needed for mild pain.   ADVOCATE INSULIN SYRINGE 31G X 5/16" 1 ML MISC    Alirocumab (PRALUENT) 150 MG/ML SOAJ Inject into the skin. Per patient taking every other week   aspirin 81 MG tablet Take 81 mg  by mouth daily.   Blood Glucose Monitoring Suppl (ACCU-CHEK GUIDE) w/Device KIT    Cholecalciferol (VITAMIN D) 2000 units CAPS Take 2,000 Units by mouth daily.    cyanocobalamin 1000 MCG tablet Take 1,000 mcg by mouth daily.   desvenlafaxine (PRISTIQ) 100 MG 24 hr tablet Take 100 mg by mouth daily.   docusate sodium (COLACE) 100 MG capsule Take 100 mg by mouth at bedtime.   ezetimibe (ZETIA) 10 MG tablet Take 10 mg by mouth at bedtime.   isosorbide mononitrate (IMDUR) 60 MG 24 hr tablet Take 60 mg by mouth every morning.    levETIRAcetam (KEPPRA) 750 MG tablet TAKE 1 TABLET(750 MG) BY MOUTH TWICE DAILY   lisinopril (PRINIVIL,ZESTRIL) 5 MG tablet Take 5 mg by mouth every morning.    metoprolol tartrate (LOPRESSOR) 25 MG tablet TAKE 1 TABLET(25 MG) BY MOUTH TWICE DAILY   Multiple Vitamins-Minerals (CENTRUM SILVER 50+MEN) TABS Take 1 tablet by mouth every morning.   nitroGLYCERIN (NITROSTAT) 0.4 MG SL tablet Place 1 tablet (0.4 mg total) under the tongue every 5 (five) minutes as needed for chest pain.   NOVOLOG 100 UNIT/ML injection    OXYGEN Place 2 L into the nose at bedtime.   rosuvastatin (CRESTOR) 40 MG tablet Take 40 mg by mouth at bedtime.    TEGRETOL-XR 200 MG 12 hr tablet TAKE 3 TABLETS BY MOUTH TWO TIMES A DAY.     Allergies:   Aspirin, Doxepin hcl, Venlafaxine, and Latex   Social History   Socioeconomic History   Marital status: Married    Spouse name: Jeff [name]   Number of children: 0   Years of education: college   Highest education level: Not on file  Occupational History   Occupation: Unemployed    Comment: previously worked in Press photographer  Tobacco Use   Smoking status: Never   Smokeless tobacco: Never  Vaping Use   Vaping Use: Never used  Substance and Sexual Activity   Alcohol use: No   Drug use: No   Sexual activity: Yes  Other Topics Concern   Not on file  Social History Narrative   Patient lives with his wifeBenjamine Mola).   Patient does not have any children.    Patient is on disability.   Patient is left-handed (writes), but right hand with sports.   Patient has a college education.   Patient drinks 5-6 cups of caffeine daily.   Social Determinants of Health   Financial Resource Strain: Not on file  Food Insecurity: Not on file  Transportation Needs: Not on file  Physical Activity: Not on file  Stress: Not on file  Social Connections: Not on file     Family History: The patient's family history includes Arthritis in his sister;  Coronary artery disease (age of onset: 84) in his father; Crohn's disease in his mother; Diabetes in his father; Hearing loss in his mother; Hypertension in his father; Squamous cell carcinoma in his brother.  ROS:   Please see the history of present illness.    All other systems reviewed and are negative.  EKGs/Labs/Other Studies Reviewed:    EKG:  EKG is not ordered today.    Recent Labs: 04/22/2021: ALT 32; BUN 9; Creatinine, Ser 0.79; Hemoglobin 15.1; Platelets 194; Potassium 4.9; Sodium 141  Recent Lipid Panel    Component Value Date/Time   CHOL 138 06/03/2011 0640   TRIG 111 06/03/2011 0640   HDL 42 06/03/2011 0640   CHOLHDL 3.3 06/03/2011 0640   VLDL 22 06/03/2011 0640   LDLCALC 74 06/03/2011 0640     Risk Assessment/Calculations:                Physical Exam:    VS:  BP 122/76   Pulse 88   Ht _0  (1.803 m)   Wt 231 lb 9.6 oz (105.1 kg)   SpO2 95%   BMI 32.30 kg/m     Wt Readings from Last 3 Encounters:  03/12/22 231 lb 9.6 oz (105.1 kg)  09/23/21 232 lb 12.8 oz (105.6 kg)  04/22/21 232 lb 9.6 oz (105.5 kg)     GEN:  Well nourished, well developed in no acute distress HEENT: Normal NECK: No JVD; No carotid bruits LYMPHATICS: No lymphadenopathy CARDIAC: RRR, no murmurs, rubs, gallops RESPIRATORY:  Clear to auscultation without rales, wheezing or rhonchi  ABDOMEN: Soft, non-tender, non-distended MUSCULOSKELETAL:  No edema; No deformity  SKIN: Warm and dry NEUROLOGIC:   Alert and oriented x 3 PSYCHIATRIC:  Normal affect   ASSESSMENT:    1. Coronary artery disease involving native coronary artery of native heart with angina pectoris ([place])   2. Mixed hyperlipidemia   3. Essential hypertension    PLAN:    In order of problems listed above:  The patient is stable on his current medical program which includes aspirin, Praluent, Zetia, rosuvastatin at high intensity dose, metoprolol, isosorbide, and lisinopril in the setting of type 1 diabetes.  I did a literature review on isosorbide and retinopathy and do not see any red flags there.  He will discuss further with his ophthalmologist.  I did not make any medicine changes today.  I emphasized the importance of exercise and considerations around trying to establish a regular walking program. Treated aggressively with a combination of high intensity statin, Zetia, and a PCSK9 inhibitor.  Labs are followed through Dr. Silvestre Mesi office.  His last LDL cholesterol on file was 23 mg/dL. Well-controlled on multidrug therapy as outlined above.  Most recent labs reviewed.  No changes made today.           Medication Adjustments/Labs and Tests Ordered: Current medicines are reviewed at length with the patient today.  Concerns regarding medicines are outlined above.  No orders of the defined types were placed in this encounter.  No orders of the defined types were placed in this encounter.   Patient Instructions  Medication Instructions:  Your physician recommends that you continue on your current medications as directed. Please refer to the Current Medication list given to you today.  *If you need a refill on your cardiac medications before your next appointment, please call your pharmacy*   Lab Work: NONE If you have labs (blood work) drawn today and your tests are completely normal, you will  receive your results only by: [place] (if you have MyChart) OR A paper copy in the mail If you have any lab  test that is abnormal or we need to change your treatment, we will call you to review the results.   Testing/Procedures: NONE   Follow-Up: At [facility], you and your health needs are our priority.  As part of our continuing mission to provide you with exceptional heart care, we have created designated Provider Care Teams.  These Care Teams include your primary Cardiologist (physician) and Advanced Practice Providers (APPs -  Physician Assistants and Nurse Practitioners) who all work together to provide you with the care you need, when you need it.  Your next appointment:   6 month(s)  The format for your next appointment:   In Person  Provider:   Sherren Mocha, MD       Important Information About Sugar         Signed, Sherren Mocha, MD  03/12/2022 1:19 PM    [place]

## 2022-03-16 ENCOUNTER — Ambulatory Visit: Payer: Self-pay

## 2022-03-16 NOTE — Patient Outreach (Signed)
  Care Coordination   Initial Visit Note   03/16/2022 Name: Jeff [name] MRN: [id number] DOB: 1965/09/05  Jeff [name] is a 56 y.o. year old male who sees Crist Infante, MD for primary care. I spoke with  Jeff [name] by phone today.  What matters to the patients health and wellness today?  To be able to afford my medications    Goals Addressed             This Visit's Progress    COMPLETED: Care Coordination Activities       Care Coordination Interventions: SDoH screening performed - no acute resource challenges identified at this time Discussed patient does experience difficulty affording medications including Tegretol XR, Praluent, Insulin, and Venlafaxine Determined the patients provider may switch him to [place] during an upcoming office visit to see if this would be more cost effective than Praluent Education provided on the role of [place] team and ability to assist with PAP and/or establishing refills at a [facility] contracted pharmacy for cost savings - patient agreeable to referral Discussed the patient is on 2L O2 QHS and has recently received an email the DME company who covers this equipment will change. Advised the patient to speak with his health care provider as needed if new orders are needed for O2 supplies Education provided on the patients Go 365 health plan benefit, mailed information for review Educated patient on the role of the care coordination RN Care Manager - no follow up desired at this time Instructed the patient to contact his primary care provider as needed         SDOH assessments and interventions completed:  Yes  SDOH Interventions Today    [place] Most Recent Value  SDOH Interventions   Food Insecurity Interventions Intervention Not Indicated  Housing Interventions Intervention Not Indicated  Transportation Interventions Intervention Not Indicated  Utilities Interventions Intervention Not Indicated        Care  Coordination Interventions Activated:  Yes  Care Coordination Interventions:  Yes, provided   Follow up plan: Referral made to [place] Team    Encounter Outcome:  Pt. Visit Completed   Daneen Schick, Arita Miss, CDP Social Worker, Certified Dementia Practitioner [place] Coordination [phone]

## 2022-03-16 NOTE — Patient Instructions (Signed)
Visit Information  Thank you for taking time to visit with me today. Please don't hesitate to contact me if I can be of assistance to you.   Following are the goals we discussed today:   Goals Addressed             This Visit's Progress    COMPLETED: Care Coordination Activities       Care Coordination Interventions: SDoH screening performed - no acute resource challenges identified at this time Discussed patient does experience difficulty affording medications including Tegretol XR, Praluent, Insulin, and Venlafaxine Determined the patients provider may switch him to [place] during an upcoming office visit to see if this would be more cost effective than Praluent Education provided on the role of [place] team and ability to assist with PAP and/or establishing refills at a [facility] contracted pharmacy for cost savings - patient agreeable to referral Discussed the patient is on 2L O2 QHS and has recently received an email the DME company who covers this equipment will change. Advised the patient to speak with his health care provider as needed if new orders are needed for O2 supplies Education provided on the patients Go 365 health plan benefit, mailed information for review Educated patient on the role of the care coordination RN Care Manager - no follow up desired at this time Instructed the patient to contact his primary care provider as needed          Please call the care guide team at [phone] if you need to schedule an appointment with our care coordination team.  If you are experiencing a Mental Health or [place] or need someone to talk to, please call 1-800-273-TALK (toll free, 24 hour hotline)  Patient verbalizes understanding of instructions and care plan provided today and agrees to view in [place]. Active MyChart status and patient understanding of how to access instructions and care plan via MyChart confirmed with patient.     No further  follow up required: Please contact your primary care provider as needed. A member of the pharmacy team will contact you regarding medication costs. Below, you will find information on the Go 365 benefit.  Daneen Schick, BSW, CDP Social Worker, Certified Dementia Practitioner [place] Management  Care Coordination [phone]    How to contact your health plans GO 365 Benefit to obtain gift cards Contact your health plan customer service (number on back of your card)  [phone] have your health plan I.D ready to provide Ask for the [place] 365 plan department Enroll in GO 365 and answer all health and social questions to earn "points" Use your points to cash in for gift cards (restaurants, [employer], etc) You may use a gift card to purchase items of your choosing  Go 365 starts over each plan year and you can earn points for preventative health maintenance such as receiving a flu shot, bone density screen and mammogram. You may also earn points for staying active and social (spending time with family, attending church, walking a certain number of steps each day, etc). You must call to self-report your activities. Your health maintenance items will automatically process based on insurance claims. Once you are ready to redeem points, they will send you gift cards in the amount of your choice. Try to order gift cards prior to the end of the plan year so you do not miss out on points.

## 2022-03-18 ENCOUNTER — Telehealth: Payer: Self-pay | Admitting: Pharmacist

## 2022-03-18 NOTE — Progress Notes (Signed)
[facility])  [place] Team    03/18/2022  Jeff [name] 1965/08/16 [id number]  Reason for referral: Medication Assistance with cost of medications ( Tegretol XR, praluent, insulin, desvenlafaxine)   Referral source: [facility] Social Worker Current insurance: [employer]  Outreach:  Unsuccessful telephone call with [name].  HIPAA compliant voicemail left for patient to return my call.    Subjective:  Patient expressed difficulty affording medications (Tegretol XR, Praluent, insulin, and desvenlafaxine).    Objective: The ASCVD Risk score (Arnett DK, et al., 2019) failed to calculate for the following reasons:   The patient has a prior MI or stroke diagnosis  Lab Results  Component Value Date   CREATININE 0.79 04/22/2021   CREATININE 0.77 03/09/2018   CREATININE 0.72 (L) 10/31/2017    Lab Results  Component Value Date   HGBA1C 7.1 (H) 11/24/2011    Lipid Panel     Component Value Date/Time   CHOL 138 06/03/2011 0640   TRIG 111 06/03/2011 0640   HDL 42 06/03/2011 0640   CHOLHDL 3.3 06/03/2011 0640   VLDL 22 06/03/2011 0640   LDLCALC 74 06/03/2011 0640    BP Readings from Last 3 Encounters:  03/12/22 122/76  09/23/21 134/64  04/22/21 113/72    Allergies  Allergen Reactions   Aspirin     Other reaction(s): Unknown   Doxepin Hcl     Other reaction(s): did not work and had some extra nocturia   Venlafaxine     Other reaction(s): Too much sedation   Latex Rash   Plan: Will follow-up in 1 to 3 business days.   Loretha Brasil, PharmD [place] Pharmacist Office: [phone]

## 2022-03-24 DIAGNOSIS — R5383 Other fatigue: Secondary | ICD-10-CM | POA: Diagnosis not present

## 2022-03-24 DIAGNOSIS — E291 Testicular hypofunction: Secondary | ICD-10-CM | POA: Diagnosis not present

## 2022-03-24 DIAGNOSIS — I10 Essential (primary) hypertension: Secondary | ICD-10-CM | POA: Diagnosis not present

## 2022-03-24 DIAGNOSIS — R7989 Other specified abnormal findings of blood chemistry: Secondary | ICD-10-CM | POA: Diagnosis not present

## 2022-03-24 DIAGNOSIS — E785 Hyperlipidemia, unspecified: Secondary | ICD-10-CM | POA: Diagnosis not present

## 2022-03-24 DIAGNOSIS — Z125 Encounter for screening for malignant neoplasm of prostate: Secondary | ICD-10-CM | POA: Diagnosis not present

## 2022-03-24 DIAGNOSIS — E1039 Type 1 diabetes mellitus with other diabetic ophthalmic complication: Secondary | ICD-10-CM | POA: Diagnosis not present

## 2022-03-24 DIAGNOSIS — Z1212 Encounter for screening for malignant neoplasm of rectum: Secondary | ICD-10-CM | POA: Diagnosis not present

## 2022-04-08 DIAGNOSIS — Z794 Long term (current) use of insulin: Secondary | ICD-10-CM | POA: Diagnosis not present

## 2022-04-08 DIAGNOSIS — I2510 Atherosclerotic heart disease of native coronary artery without angina pectoris: Secondary | ICD-10-CM | POA: Diagnosis not present

## 2022-04-08 DIAGNOSIS — Z0000 Encounter for general adult medical examination without abnormal findings: Secondary | ICD-10-CM | POA: Diagnosis not present

## 2022-04-08 DIAGNOSIS — I10 Essential (primary) hypertension: Secondary | ICD-10-CM | POA: Diagnosis not present

## 2022-04-08 DIAGNOSIS — Q8500 Neurofibromatosis, unspecified: Secondary | ICD-10-CM | POA: Diagnosis not present

## 2022-04-08 DIAGNOSIS — R82998 Other abnormal findings in urine: Secondary | ICD-10-CM | POA: Diagnosis not present

## 2022-04-08 DIAGNOSIS — E785 Hyperlipidemia, unspecified: Secondary | ICD-10-CM | POA: Diagnosis not present

## 2022-04-08 DIAGNOSIS — E669 Obesity, unspecified: Secondary | ICD-10-CM | POA: Diagnosis not present

## 2022-04-08 DIAGNOSIS — Z4681 Encounter for fitting and adjustment of insulin pump: Secondary | ICD-10-CM | POA: Diagnosis not present

## 2022-04-08 DIAGNOSIS — E1039 Type 1 diabetes mellitus with other diabetic ophthalmic complication: Secondary | ICD-10-CM | POA: Diagnosis not present

## 2022-04-12 DIAGNOSIS — H9190 Unspecified hearing loss, unspecified ear: Secondary | ICD-10-CM | POA: Diagnosis not present

## 2022-04-12 DIAGNOSIS — H6123 Impacted cerumen, bilateral: Secondary | ICD-10-CM | POA: Diagnosis not present

## 2022-04-21 ENCOUNTER — Ambulatory Visit: Payer: Medicare PPO | Admitting: Neurology

## 2022-04-21 VITALS — BP 130/73 | HR 71 | Ht 71.0 in | Wt 233.0 lb

## 2022-04-21 DIAGNOSIS — Q8501 Neurofibromatosis, type 1: Secondary | ICD-10-CM | POA: Diagnosis not present

## 2022-04-21 DIAGNOSIS — G40209 Localization-related (focal) (partial) symptomatic epilepsy and epileptic syndromes with complex partial seizures, not intractable, without status epilepticus: Secondary | ICD-10-CM | POA: Diagnosis not present

## 2022-04-21 MED ORDER — LEVETIRACETAM 750 MG PO TABS: ORAL_TABLET | ORAL | 3 refills | Status: DC

## 2022-04-21 MED ORDER — TEGRETOL-XR 200 MG PO TB12: ORAL_TABLET | ORAL | 3 refills | Status: DC

## 2022-04-21 NOTE — Progress Notes (Unsigned)
Patient: Jeff [name] Date of Birth: 02-22-1966  Reason for Visit: Follow up History from: Patient Primary Neurologist: Jeff [name]  ASSESSMENT AND PLAN 56 y.o. year old male   67.  History of seizures 2.  Neurofibromatosis type I -No recurrent seizures, last was in 2019 -Continue brand-name Tegretol, generic Keppra for seizure prevention -Check carbamazepine level -Discussed we should check MRI of the brain with and without contrast to ensure stability either this year or next year (no specific recommendations for surveillance were found but every 4-5 years without new symptoms has been his trend); This visit, no new neurological issues -I will see him back in 1 year or sooner if needed   MRI of the brain September 2019 IMPRESSION: This is an abnormal MRI of the brain with and without contrast showing the following: 1.     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 2.     Plexiform neurofibroma of the left scalp. 3.     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     Dural ectasia of the optic nerves, right greater than left. 5.     The changes of neurofibromatosis are stable compared to the MRI from 2014. 6.     Mild generalized cortical atrophy.    The structures of the mesial temporal lobes appear normal. 7.     Mild ethmoid chronic sinusitis. 8.     There are no acute findings.  HISTORY OF PRESENT ILLNESS: Today 04/21/22 Jeff [name] is here today for follow-up.  Has history of neurofibromatosis type I.  Has history of seizures.  Is on carbamazepine and Keppra. Is no longer driving anymore due to diabetes. Last seizure was in 2019, when driving. His 1st seizure occurred when he was in his 67's. He remains on brand name Tegretol. He took generic Tegretol for a few months, Dr. Jannifer [name] switched back to brand name, no clear he had any issues with the  generic. Has neurofibromas to his arms, face, has seen dermatology. Denies any new neurological symptoms.   Labs from PCP 03/24/2022 CMP was unremarkable, creatinine 0.8, sodium 142, CBC unremarkable TSH 1.51, A1c 7.2, cholesterol 97, LDL 32.  HISTORY  04/22/21 Dr. Jannifer [name]: Jeff [name] is a 56 year old left-handed white male with a history of neurofibromatosis type I.  The patient has type 1 diabetes and a history of seizures.  Last seizure was about 2 years ago unfortunately while driving.  He currently does not operate a motor vehicle but mainly because of visual issues associated with diabetic retinopathy.  The patient reports that he has chronic insomnia, he naps off and on throughout the day.  He has not had any further seizures since last seen.  He is tolerating the Keppra and the carbamazepine fairly well.  He returns here for further evaluation.  The patient indicates that he drinks caffeinated Diet Coke on average 3 daily.  REVIEW OF SYSTEMS: Out of a complete 14 system review of symptoms, the patient complains only of the following symptoms, and all other reviewed systems are negative.  See HPI  ALLERGIES: Allergies  Allergen Reactions   Aspirin     Other reaction(s): Unknown   Doxepin Hcl     Other reaction(s): did not work and had some extra nocturia   Venlafaxine     Other reaction(s): Too much sedation   Latex Rash    HOME MEDICATIONS:  Outpatient Medications Prior to Visit  Medication Sig Dispense Refill   ACCU-CHEK GUIDE test strip      Accu-Chek Softclix Lancets lancets      acetaminophen (TYLENOL) 500 MG tablet Take 1,000 mg by mouth every 6 (six) hours as needed for mild pain.     ADVOCATE INSULIN SYRINGE 31G X 5/16" 1 ML MISC      Alirocumab (PRALUENT) 150 MG/ML SOAJ Inject into the skin. Per patient taking every other week     aspirin 81 MG tablet Take 81 mg by mouth daily.     Blood Glucose Monitoring Suppl (ACCU-CHEK GUIDE) w/Device KIT      Cholecalciferol (VITAMIN  D) 2000 units CAPS Take 2,000 Units by mouth daily.      cyanocobalamin 1000 MCG tablet Take 1,000 mcg by mouth daily.     desvenlafaxine (PRISTIQ) 100 MG 24 hr tablet Take 100 mg by mouth daily.     docusate sodium (COLACE) 100 MG capsule Take 100 mg by mouth at bedtime.     ezetimibe (ZETIA) 10 MG tablet Take 10 mg by mouth at bedtime.     isosorbide mononitrate (IMDUR) 60 MG 24 hr tablet Take 60 mg by mouth every morning.      levETIRAcetam (KEPPRA) 750 MG tablet TAKE 1 TABLET(750 MG) BY MOUTH TWICE DAILY 180 tablet 3   lisinopril (PRINIVIL,ZESTRIL) 5 MG tablet Take 5 mg by mouth every morning.      metoprolol tartrate (LOPRESSOR) 25 MG tablet TAKE 1 TABLET(25 MG) BY MOUTH TWICE DAILY 180 tablet 2   Multiple Vitamins-Minerals (CENTRUM SILVER 50+MEN) TABS Take 1 tablet by mouth every morning.     nitroGLYCERIN (NITROSTAT) 0.4 MG SL tablet Place 1 tablet (0.4 mg total) under the tongue every 5 (five) minutes as needed for chest pain. 25 tablet 5   NOVOLOG 100 UNIT/ML injection      OXYGEN Place 2 L into the nose at bedtime.     PRALUENT 150 MG/ML SOAJ Inject 150 mg into the skin.     rosuvastatin (CRESTOR) 40 MG tablet Take 40 mg by mouth at bedtime.      TEGRETOL-XR 200 MG 12 hr tablet TAKE 3 TABLETS BY MOUTH TWO TIMES A DAY. 540 tablet 3   No facility-administered medications prior to visit.    PAST MEDICAL HISTORY: Past Medical History:  Diagnosis Date   Anxiety    CAD (coronary artery disease)    Cath 2011.  LAD stent patent, distal LAD occlusion, D1 100%, OM branch 80 - 90%, SVG to diag patent, LIMA to LAD occluded, SVG to RCA occluded, SVG to OM was occluded.   No change from previous cath.  Managed medically.    Cataract    Depression    Diabetes mellitus type I ([place])    on insulin pump at  home   Diabetic retinopathy    s/p vitrectomy and history of retinal surgery   Hemorrhoid    History of oxygen administration    nocutural use only at 2 l/m nasally.   HTN (hypertension)     Hyperlipidemia    Myocardial infarction [facility])    Neurofibromatosis    with neurofibroma lesion at the base of the skull   Seasonal allergic rhinitis    Seizure disorder (HCC)    Sleep apnea    s/p oral surgery   Toe fracture, right    11-23-14 fracture right big toe-wearing a stability shoe    PAST SURGICAL HISTORY: Past Surgical History:  Procedure Laterality Date   APPENDECTOMY     CARDIAC CATHETERIZATION     CATARACT EXTRACTION, BILATERAL     COLONOSCOPY WITH PROPOFOL N/A 07/21/2017   Procedure: COLONOSCOPY WITH PROPOFOL;  Surgeon: Milus Banister, MD;  Location: [facility];  Service: Endoscopy;  Laterality: N/A;   CORONARY ANGIOPLASTY     9'11,  CABPG grafts had failed.   CORONARY ARTERY BYPASS GRAFT     4 vessel 4/11   ESOPHAGOGASTRODUODENOSCOPY (EGD) WITH PROPOFOL N/A 12/05/2014   Procedure: ESOPHAGOGASTRODUODENOSCOPY (EGD) WITH PROPOFOL;  Surgeon: Milus Banister, MD;  Location: [facility];  Service: Endoscopy;  Laterality: N/A;   LAPAROSCOPIC APPENDECTOMY  11/24/2011   LEFT HEART CATHETERIZATION WITH CORONARY/GRAFT ANGIOGRAM N/A 02/22/2013   Procedure: LEFT HEART CATHETERIZATION WITH Beatrix Fetters;  Surgeon: Sherren Mocha, MD;  Location: [facility] CATH LAB;  Service: Cardiovascular;  Laterality: N/A;   release of right transverse carpal ligament     right eye vitrectomy and detached retina repair  10/2007   TONSILLECTOMY     uvulopalatoplasty surgery      FAMILY HISTORY: Family History  Problem Relation Age of Onset   Coronary artery disease Father 67   Diabetes Father        type II   Hypertension Father    Squamous cell carcinoma Brother    Arthritis Sister    Crohn's disease Mother    Hearing loss Mother     SOCIAL HISTORY: Social History   Socioeconomic History   Marital status: Married    Spouse name: [name]   Number of children: 0   Years of education: college   Highest education level: Not on file  Occupational History   Occupation:  Unemployed    Comment: previously worked in Press photographer  Tobacco Use   Smoking status: Never   Smokeless tobacco: Never  Scientific laboratory technician Use: Never used  Substance and Sexual Activity   Alcohol use: No   Drug use: No   Sexual activity: Yes  Other Topics Concern   Not on file  Social History Narrative   Patient lives with his wifeBenjamine Mola).   Patient does not have any children.   Patient is on disability.   Patient is left-handed (writes), but right hand with sports.   Patient has a college education.   Patient drinks 5-6 cups of caffeine daily.   Social Determinants of Health   Financial Resource Strain: Not on file  Food Insecurity: No Food Insecurity (03/16/2022)   Hunger Vital Sign    Worried About Running Out of Food in the Last Year: Never true    Ran Out of Food in the Last Year: Never true  Transportation Needs: No Transportation Needs (03/16/2022)   PRAPARE - Hydrologist (Medical): No    Lack of Transportation (Non-Medical): No  Physical Activity: Not on file  Stress: Not on file  Social Connections: Not on file  Intimate Partner Violence: Not on file   PHYSICAL EXAM  Vitals:   04/21/22 1509  BP: 130/73  Pulse: 71  Weight: 233 lb (105.7 kg)  Height: '5\' 11"'  (1.803 m)   Body mass index is 32.5 kg/m.  Generalized: Well developed, in no acute distress  Neurological examination  Mentation: Alert oriented to time, place, history taking. Follows all commands speech and language fluent Cranial nerve II-XII: Pupils were equal round reactive to light. Extraocular movements were full, visual field were full on confrontational test. Facial sensation and strength were  normal. Head turning and shoulder shrug  were normal and symmetric. Motor: The motor testing reveals 5 over 5 strength of all 4 extremities. Good symmetric motor tone is noted throughout. Diffuse neurofibromas to the face,  arms, hands noted.  Sensory: Sensory testing is  intact to soft touch on all 4 extremities. No evidence of extinction is noted.  Coordination: Cerebellar testing reveals good finger-nose-finger and heel-to-shin bilaterally.  Gait and station: Gait is normal. Tandem gait is unsteady. Reflexes: Deep tendon reflexes are symmetric and normal bilaterally.   DIAGNOSTIC DATA (LABS, IMAGING, TESTING) - I reviewed patient records, labs, notes, testing and imaging myself where available.  Lab Results  Component Value Date   WBC 4.1 04/22/2021   HGB 15.1 04/22/2021   HCT 43.2 04/22/2021   MCV 89 04/22/2021   PLT 194 04/22/2021      Component Value Date/Time   NA 141 04/22/2021 1602   K 4.9 04/22/2021 1602   CL 102 04/22/2021 1602   CO2 28 04/22/2021 1602   GLUCOSE 231 (H) 04/22/2021 1602   GLUCOSE 154 (H) 03/09/2018 1707   BUN 9 04/22/2021 1602   CREATININE 0.79 04/22/2021 1602   CALCIUM 9.1 04/22/2021 1602   PROT 6.1 04/22/2021 1602   ALBUMIN 4.3 04/22/2021 1602   AST 26 04/22/2021 1602   ALT 32 04/22/2021 1602   ALKPHOS 100 04/22/2021 1602   BILITOT 0.3 04/22/2021 1602   GFRNONAA >60 03/09/2018 1707   GFRAA >60 03/09/2018 1707   Lab Results  Component Value Date   CHOL 138 06/03/2011   HDL 42 06/03/2011   LDLCALC 74 06/03/2011   TRIG 111 06/03/2011   CHOLHDL 3.3 06/03/2011   Lab Results  Component Value Date   HGBA1C 7.1 (H) 11/24/2011   No results found for: "VITAMINB12" Lab Results  Component Value Date   TSH 0.950 02/26/2012    Butler Denmark, AGNP-C, DNP 04/21/2022, 3:22 PM [facility] [address] [place], [place] [address] [phone]

## 2022-04-22 ENCOUNTER — Encounter: Payer: Self-pay | Admitting: Neurology

## 2022-04-22 DIAGNOSIS — E1039 Type 1 diabetes mellitus with other diabetic ophthalmic complication: Secondary | ICD-10-CM | POA: Diagnosis not present

## 2022-04-22 LAB — CARBAMAZEPINE LEVEL, TOTAL: Carbamazepine (Tegretol), S: 11.1 ug/mL (ref 4.0–12.0)

## 2022-05-03 ENCOUNTER — Telehealth: Payer: Self-pay | Admitting: Podiatry

## 2022-05-03 NOTE — Telephone Encounter (Signed)
Pt called and asked to file a complaint. I asked pt how I could help him he said he has called the diabetic shoe dept and left messages over the last month and no one has called him back.  I asked him what was going on with the shoes and he said he was measured by Aaron Edelman and then was given the shoes by Christan and was told to wear them in th house to try them out. He states that he was told by a friend who is diabetic that the orthofeet shoes should be ordered a 1/2 size up. He stated the orthofeet he got were a 9.5 and he normally in a upscale new balance wears a 9.5. He is asking if and when can he start wearing the shoes outside. He said he is concerned that if he goes outside and wears the shoes and has some swelling they will not fit. I explained that once they are worn outside they cannot be returned. But in general if you get shoes for your feet when they are swollen they maybe to big for you on the days that they do not swell and could be too big and rub your feet and cause blister or wounds. He said they are not rubbing or causing any pain or discomfort in the house currently. No red spots either. He said he does feel a funny sensation as the day progresses. He is concerned that if he wears the shoes outside and then they do not work he will not be able to return them. He said that if he has missed the window to return them he will be upset. He asked if I was a nurse and I told him I was not but I did work in that department for several years. He would like a nurse to call him to discuss further.  He also asked about why he was seeing Dr Valentina Lucks 2 times previously and then was switched to Dr Sherryle Lis and was not sure why. I did explain that Dr Valentina Lucks is our PRN doctor and is not hear all the time and so they transferred his care to a doctor that is here on a regular basis and he said thank you.

## 2022-05-06 DIAGNOSIS — F429 Obsessive-compulsive disorder, unspecified: Secondary | ICD-10-CM | POA: Diagnosis not present

## 2022-05-27 ENCOUNTER — Telehealth: Payer: Self-pay | Admitting: Pharmacist

## 2022-05-27 NOTE — Progress Notes (Signed)
[facility] Customer service manager [facility]) Quality Pharmacy Team [facility]   05/27/2022  DARIK MASSING 12-19-1965 [id number]  Reason for referral: Medication Assistance  Referral source: [facility] Social Worker Current insurance: [employer]  Reason for call: Medication Assistance   Outreach:  Unsuccessful telephone call attempt #2  to patient.   HIPAA compliant voicemail left requesting a return call  Plan:  -I will make another outreach attempt to patient within 3-4 business days.  Thank you for allowing [facility] pharmacy to be a part of this patient's care.   Reynold Bowen, PharmD [facility]  [facility] Clinical Pharmacist Office: [phone]

## 2022-05-31 ENCOUNTER — Telehealth: Payer: Self-pay | Admitting: Neurology

## 2022-05-31 ENCOUNTER — Telehealth: Payer: Self-pay | Admitting: Pharmacist

## 2022-05-31 DIAGNOSIS — L853 Xerosis cutis: Secondary | ICD-10-CM | POA: Diagnosis not present

## 2022-05-31 DIAGNOSIS — D3617 Benign neoplasm of peripheral nerves and autonomic nervous system of trunk, unspecified: Secondary | ICD-10-CM | POA: Diagnosis not present

## 2022-05-31 DIAGNOSIS — L918 Other hypertrophic disorders of the skin: Secondary | ICD-10-CM | POA: Diagnosis not present

## 2022-05-31 DIAGNOSIS — D1801 Hemangioma of skin and subcutaneous tissue: Secondary | ICD-10-CM | POA: Diagnosis not present

## 2022-05-31 DIAGNOSIS — D3615 Benign neoplasm of peripheral nerves and autonomic nervous system of abdomen: Secondary | ICD-10-CM | POA: Diagnosis not present

## 2022-05-31 DIAGNOSIS — Q8500 Neurofibromatosis, unspecified: Secondary | ICD-10-CM | POA: Diagnosis not present

## 2022-05-31 DIAGNOSIS — D3612 Benign neoplasm of peripheral nerves and autonomic nervous system, upper limb, including shoulder: Secondary | ICD-10-CM | POA: Diagnosis not present

## 2022-05-31 DIAGNOSIS — D3611 Benign neoplasm of peripheral nerves and autonomic nervous system of face, head, and neck: Secondary | ICD-10-CM | POA: Diagnosis not present

## 2022-05-31 DIAGNOSIS — D485 Neoplasm of uncertain behavior of skin: Secondary | ICD-10-CM | POA: Diagnosis not present

## 2022-05-31 MED ORDER — LEVETIRACETAM 750 MG PO TABS: ORAL_TABLET | ORAL | 1 refills | Status: DC

## 2022-05-31 NOTE — Telephone Encounter (Signed)
Pt is requesting a refill for levETIRAcetam (KEPPRA) 750 MG tablet .  Pharmacy: [facility] [phone]

## 2022-05-31 NOTE — Telephone Encounter (Signed)
Rx sent 

## 2022-05-31 NOTE — Progress Notes (Signed)
[facility])  [place] Team    05/31/2022  Jeff [name] Jul 02, 1965 [id number]  Reason for referral:  Praluent and Novolog   Referral source: [facility] Social Worker Current insurance: [employer]  Outreach:  Successful telephone call with Jeff [name].  HIPAA identifiers verified.   Medication Assistance Findings:  Placed telephonic outreach with patient on three way call to the [employer] for enrollment for [place]. Mr. Simonis was enrolled starting 05/01/2022-05/01/2023. HealthWell ID [id number] HealthWell Card: ID: [id number] Group: [id number] BIN: [id number] PCN: [id number]  Placed follow coordination call to [facility], supplied HealthWell card information and received confirmation of $0 co-pay for Praluent. Patient was notified.   No other medication assistance options identified for assistance at this time.   Extra Help:  Not eligible for Extra Help Low Income Subsidy based on reported income and assets  Patient Assistance Programs: Novolog made by [employer] requirement met: Yes Out-of-pocket prescription expenditure met:   Not Applicable Patient has met application requirements to apply for this program.   Plan: I will route patient assistance letter to [place] technician who will coordinate patient assistance program application process for medications listed above.  [facility] pharmacy technician will assist with obtaining all required documents from both patient and provider(s) and submit application(s) once completed.    Loretha Brasil, PharmD [place] Pharmacist Office: [phone]

## 2022-05-31 NOTE — Addendum Note (Signed)
Addended by: Christophe Louis E on: 05/31/2022 10:43 AM   Modules accepted: Orders

## 2022-06-01 DIAGNOSIS — E1039 Type 1 diabetes mellitus with other diabetic ophthalmic complication: Secondary | ICD-10-CM | POA: Diagnosis not present

## 2022-06-01 DIAGNOSIS — Z794 Long term (current) use of insulin: Secondary | ICD-10-CM | POA: Diagnosis not present

## 2022-06-01 DIAGNOSIS — I2510 Atherosclerotic heart disease of native coronary artery without angina pectoris: Secondary | ICD-10-CM | POA: Diagnosis not present

## 2022-06-01 DIAGNOSIS — Z4681 Encounter for fitting and adjustment of insulin pump: Secondary | ICD-10-CM | POA: Diagnosis not present

## 2022-06-01 DIAGNOSIS — I10 Essential (primary) hypertension: Secondary | ICD-10-CM | POA: Diagnosis not present

## 2022-06-02 ENCOUNTER — Telehealth: Payer: Self-pay | Admitting: Pharmacy Technician

## 2022-06-02 ENCOUNTER — Ambulatory Visit (INDEPENDENT_AMBULATORY_CARE_PROVIDER_SITE_OTHER): Payer: Self-pay | Admitting: Podiatry

## 2022-06-02 DIAGNOSIS — M2042 Other hammer toe(s) (acquired), left foot: Secondary | ICD-10-CM

## 2022-06-02 DIAGNOSIS — Z596 Low income: Secondary | ICD-10-CM

## 2022-06-02 DIAGNOSIS — L84 Corns and callosities: Secondary | ICD-10-CM

## 2022-06-02 DIAGNOSIS — G629 Polyneuropathy, unspecified: Secondary | ICD-10-CM

## 2022-06-02 DIAGNOSIS — M2041 Other hammer toe(s) (acquired), right foot: Secondary | ICD-10-CM

## 2022-06-02 DIAGNOSIS — E1069 Type 1 diabetes mellitus with other specified complication: Secondary | ICD-10-CM

## 2022-06-02 DIAGNOSIS — M216X9 Other acquired deformities of unspecified foot: Secondary | ICD-10-CM

## 2022-06-02 NOTE — Progress Notes (Signed)
[facility] Customer service manager [facility])                                            [facility] Quality Pharmacy Team    06/02/2022  Jeff [name] April 16, 1966 [id number]                                      Medication Assistance Referral  Referral From: [facility] RPh Tiffany B.  Medication/Company: [employer] / [employer] Patient application portion:  Mining engineer portion: Faxed  to Dr. Rodrigo Ran Provider address/fax verified via: Office website    [employer]. Rayder Sullenger, CPhT [facility] [employer]  [phone]

## 2022-06-02 NOTE — Progress Notes (Signed)
Patient came in today due to tingling at the top of the foot when wearing the orthotics. I assured the patient it could be due to the way he has tied the shoe. The patient has stopped wearing the shoes until today. I explained to the patient that it would be best to break the inserts and shoes in starting from the top. To begin wearing the shoe/insert one hour today and increase the hour each day. Patient was advised to wear the shoes for 1 month before we need an adjustment unless theres pain

## 2022-06-03 DIAGNOSIS — F429 Obsessive-compulsive disorder, unspecified: Secondary | ICD-10-CM | POA: Diagnosis not present

## 2022-06-18 DIAGNOSIS — R051 Acute cough: Secondary | ICD-10-CM | POA: Diagnosis not present

## 2022-06-18 DIAGNOSIS — Z20822 Contact with and (suspected) exposure to covid-19: Secondary | ICD-10-CM | POA: Diagnosis not present

## 2022-07-01 ENCOUNTER — Telehealth: Payer: Self-pay | Admitting: Pharmacist

## 2022-07-01 NOTE — Progress Notes (Signed)
[facility])                                            [place] Team    07/01/2022  Jeff [name] 1966/01/03 [id number]  Returned call to [name] for a follow up question regarding his [employer] patient assistance application as well as additional medication eligibility under his [employer].  Attempted to reach [facility] and was unsuccessful. Hold times exceeded 2 hours. Will attempt to call again tomorrow.   Loretha Brasil, PharmD [place] Pharmacist Office: [phone]

## 2022-07-02 ENCOUNTER — Telehealth: Payer: Self-pay | Admitting: Pharmacist

## 2022-07-02 NOTE — Progress Notes (Signed)
[facility])                                            [place] Team    07/02/2022  Jeff [name] [birth year] [id number]  Placed telephonic follow up to Jeff [name]. HIPAA identifiers verified. Relayed information obtained from [employer]. Per representative, he is able to use the [place] for ezetimibe and rosuvastatin.   At the request of Jeff [name], I placed a coordination call to [place] and provided the [place] card for billing of the ezetimibe and rosuvastatin. Spoke with Apolonio Schneiders who states she will annotate the note to bill [employer] card at next fill.   No further follow needed at this time. Patient was appreciative of my call.   Loretha Brasil, PharmD [place] Pharmacist Office: [phone]

## 2022-07-12 DIAGNOSIS — R0902 Hypoxemia: Secondary | ICD-10-CM | POA: Diagnosis not present

## 2022-07-15 ENCOUNTER — Telehealth: Payer: Self-pay | Admitting: Pharmacist

## 2022-07-15 NOTE — Progress Notes (Signed)
[facility])                                            [place] Team    07/15/2022  Jeff [name] 1965-08-30 [id number]  Returned phone call from Mr. [name], questions on financial documentation for [employer] were answered. Patient was appreciative of my call.   Loretha Brasil, PharmD [place] Pharmacist Office: [phone]

## 2022-07-30 ENCOUNTER — Telehealth: Payer: Self-pay | Admitting: Pharmacy Technician

## 2022-07-30 DIAGNOSIS — Z596 Low income: Secondary | ICD-10-CM

## 2022-07-30 NOTE — Progress Notes (Signed)
[facility])                                            [place] Team    07/30/2022  KARRIEM MUENCH 30-Jul-1965 [id number]  Received both patient and provider portion(s) of patient assistance application(s) for Novolog. Faxed completed application and required documents into [employer].   Zale Marcotte P. Caly Pellum, [place]  [phone]

## 2022-08-04 ENCOUNTER — Telehealth: Payer: Self-pay | Admitting: Pharmacy Technician

## 2022-08-04 DIAGNOSIS — Z596 Low income: Secondary | ICD-10-CM

## 2022-08-04 NOTE — Progress Notes (Signed)
[facility])                                            [place] Team    08/04/2022  Jeff [name] [birth year] [id number]  Care coordination call placed to [place] in regard to Novolog vial application.  Spoke to [place] who informs patient is APPROVED 08/03/22-06/28/23.Medications will automatically fill and ship to provider's address on file. She informs to allow up to 15 business days for processing and delivery. Patient may call [place] at [phone] if shipment has not arrived and patient does not have sufficient supply.   [name], [name]  [phone]

## 2022-08-12 DIAGNOSIS — E103593 Type 1 diabetes mellitus with proliferative diabetic retinopathy without macular edema, bilateral: Secondary | ICD-10-CM | POA: Diagnosis not present

## 2022-08-12 DIAGNOSIS — Z961 Presence of intraocular lens: Secondary | ICD-10-CM | POA: Diagnosis not present

## 2022-08-12 DIAGNOSIS — R0902 Hypoxemia: Secondary | ICD-10-CM | POA: Diagnosis not present

## 2022-08-12 DIAGNOSIS — H47293 Other optic atrophy, bilateral: Secondary | ICD-10-CM | POA: Diagnosis not present

## 2022-08-12 DIAGNOSIS — H35371 Puckering of macula, right eye: Secondary | ICD-10-CM | POA: Diagnosis not present

## 2022-08-12 DIAGNOSIS — F429 Obsessive-compulsive disorder, unspecified: Secondary | ICD-10-CM | POA: Diagnosis not present

## 2022-08-12 DIAGNOSIS — H3321 Serous retinal detachment, right eye: Secondary | ICD-10-CM | POA: Diagnosis not present

## 2022-08-16 ENCOUNTER — Other Ambulatory Visit: Payer: Self-pay

## 2022-08-16 ENCOUNTER — Inpatient Hospital Stay (HOSPITAL_COMMUNITY)
Admission: EM | Admit: 2022-08-16 | Discharge: 2022-08-23 | DRG: 871 | Disposition: A | Discharge status: Home / Self Care | Payer: Medicare PPO | Attending: Internal Medicine | Admitting: Internal Medicine

## 2022-08-16 ENCOUNTER — Inpatient Hospital Stay (HOSPITAL_COMMUNITY): Payer: Medicare PPO

## 2022-08-16 ENCOUNTER — Telehealth: Payer: Self-pay | Admitting: Cardiovascular Disease

## 2022-08-16 ENCOUNTER — Encounter (HOSPITAL_COMMUNITY): Payer: Self-pay | Admitting: Critical Care Medicine

## 2022-08-16 ENCOUNTER — Emergency Department (HOSPITAL_COMMUNITY): Payer: Medicare PPO

## 2022-08-16 DIAGNOSIS — N179 Acute kidney failure, unspecified: Secondary | ICD-10-CM | POA: Diagnosis not present

## 2022-08-16 DIAGNOSIS — E1065 Type 1 diabetes mellitus with hyperglycemia: Secondary | ICD-10-CM | POA: Diagnosis not present

## 2022-08-16 DIAGNOSIS — I639 Cerebral infarction, unspecified: Secondary | ICD-10-CM

## 2022-08-16 DIAGNOSIS — Z822 Family history of deafness and hearing loss: Secondary | ICD-10-CM

## 2022-08-16 DIAGNOSIS — S0990XA Unspecified injury of head, initial encounter: Secondary | ICD-10-CM | POA: Diagnosis not present

## 2022-08-16 DIAGNOSIS — Z7982 Long term (current) use of aspirin: Secondary | ICD-10-CM

## 2022-08-16 DIAGNOSIS — Z6833 Body mass index (BMI) 33.0-33.9, adult: Secondary | ICD-10-CM

## 2022-08-16 DIAGNOSIS — R338 Other retention of urine: Secondary | ICD-10-CM | POA: Diagnosis present

## 2022-08-16 DIAGNOSIS — I21A1 Myocardial infarction type 2: Secondary | ICD-10-CM

## 2022-08-16 DIAGNOSIS — E876 Hypokalemia: Secondary | ICD-10-CM | POA: Diagnosis present

## 2022-08-16 DIAGNOSIS — R197 Diarrhea, unspecified: Secondary | ICD-10-CM | POA: Diagnosis not present

## 2022-08-16 DIAGNOSIS — I6389 Other cerebral infarction: Secondary | ICD-10-CM | POA: Diagnosis not present

## 2022-08-16 DIAGNOSIS — G9340 Encephalopathy, unspecified: Secondary | ICD-10-CM | POA: Diagnosis not present

## 2022-08-16 DIAGNOSIS — E1010 Type 1 diabetes mellitus with ketoacidosis without coma: Secondary | ICD-10-CM | POA: Diagnosis not present

## 2022-08-16 DIAGNOSIS — E669 Obesity, unspecified: Secondary | ICD-10-CM | POA: Diagnosis present

## 2022-08-16 DIAGNOSIS — N401 Enlarged prostate with lower urinary tract symptoms: Secondary | ICD-10-CM | POA: Diagnosis present

## 2022-08-16 DIAGNOSIS — E860 Dehydration: Secondary | ICD-10-CM | POA: Diagnosis present

## 2022-08-16 DIAGNOSIS — E875 Hyperkalemia: Secondary | ICD-10-CM | POA: Diagnosis present

## 2022-08-16 DIAGNOSIS — E785 Hyperlipidemia, unspecified: Secondary | ICD-10-CM | POA: Diagnosis present

## 2022-08-16 DIAGNOSIS — I2510 Atherosclerotic heart disease of native coronary artery without angina pectoris: Secondary | ICD-10-CM | POA: Diagnosis present

## 2022-08-16 DIAGNOSIS — R34 Anuria and oliguria: Secondary | ICD-10-CM | POA: Diagnosis not present

## 2022-08-16 DIAGNOSIS — Z888 Allergy status to other drugs, medicaments and biological substances status: Secondary | ICD-10-CM

## 2022-08-16 DIAGNOSIS — R410 Disorientation, unspecified: Secondary | ICD-10-CM | POA: Diagnosis not present

## 2022-08-16 DIAGNOSIS — S199XXA Unspecified injury of neck, initial encounter: Secondary | ICD-10-CM | POA: Diagnosis not present

## 2022-08-16 DIAGNOSIS — I2489 Other forms of acute ischemic heart disease: Secondary | ICD-10-CM | POA: Diagnosis present

## 2022-08-16 DIAGNOSIS — Z9981 Dependence on supplemental oxygen: Secondary | ICD-10-CM

## 2022-08-16 DIAGNOSIS — F32A Depression, unspecified: Secondary | ICD-10-CM | POA: Diagnosis present

## 2022-08-16 DIAGNOSIS — R339 Retention of urine, unspecified: Secondary | ICD-10-CM

## 2022-08-16 DIAGNOSIS — E1110 Type 2 diabetes mellitus with ketoacidosis without coma: Secondary | ICD-10-CM | POA: Diagnosis present

## 2022-08-16 DIAGNOSIS — Y742 Prosthetic and other implants, materials and accessory general hospital and personal-use devices associated with adverse incidents: Secondary | ICD-10-CM | POA: Diagnosis present

## 2022-08-16 DIAGNOSIS — I119 Hypertensive heart disease without heart failure: Secondary | ICD-10-CM | POA: Diagnosis present

## 2022-08-16 DIAGNOSIS — R29700 NIHSS score 0: Secondary | ICD-10-CM | POA: Diagnosis present

## 2022-08-16 DIAGNOSIS — I878 Other specified disorders of veins: Secondary | ICD-10-CM | POA: Diagnosis present

## 2022-08-16 DIAGNOSIS — I700 Atherosclerosis of aorta: Secondary | ICD-10-CM | POA: Diagnosis not present

## 2022-08-16 DIAGNOSIS — Z833 Family history of diabetes mellitus: Secondary | ICD-10-CM

## 2022-08-16 DIAGNOSIS — E7220 Disorder of urea cycle metabolism, unspecified: Secondary | ICD-10-CM | POA: Diagnosis not present

## 2022-08-16 DIAGNOSIS — R6520 Severe sepsis without septic shock: Secondary | ICD-10-CM | POA: Diagnosis present

## 2022-08-16 DIAGNOSIS — Z8261 Family history of arthritis: Secondary | ICD-10-CM

## 2022-08-16 DIAGNOSIS — R0602 Shortness of breath: Secondary | ICD-10-CM | POA: Diagnosis not present

## 2022-08-16 DIAGNOSIS — I77810 Thoracic aortic ectasia: Secondary | ICD-10-CM | POA: Diagnosis present

## 2022-08-16 DIAGNOSIS — Z809 Family history of malignant neoplasm, unspecified: Secondary | ICD-10-CM

## 2022-08-16 DIAGNOSIS — Z9842 Cataract extraction status, left eye: Secondary | ICD-10-CM

## 2022-08-16 DIAGNOSIS — A419 Sepsis, unspecified organism: Secondary | ICD-10-CM | POA: Diagnosis not present

## 2022-08-16 DIAGNOSIS — I6521 Occlusion and stenosis of right carotid artery: Secondary | ICD-10-CM | POA: Diagnosis not present

## 2022-08-16 DIAGNOSIS — I4510 Unspecified right bundle-branch block: Secondary | ICD-10-CM | POA: Diagnosis present

## 2022-08-16 DIAGNOSIS — E1011 Type 1 diabetes mellitus with ketoacidosis with coma: Secondary | ICD-10-CM

## 2022-08-16 DIAGNOSIS — I252 Old myocardial infarction: Secondary | ICD-10-CM

## 2022-08-16 DIAGNOSIS — E871 Hypo-osmolality and hyponatremia: Secondary | ICD-10-CM | POA: Diagnosis present

## 2022-08-16 DIAGNOSIS — T85614A Breakdown (mechanical) of insulin pump, initial encounter: Secondary | ICD-10-CM | POA: Diagnosis present

## 2022-08-16 DIAGNOSIS — I213 ST elevation (STEMI) myocardial infarction of unspecified site: Secondary | ICD-10-CM | POA: Diagnosis not present

## 2022-08-16 DIAGNOSIS — I440 Atrioventricular block, first degree: Secondary | ICD-10-CM | POA: Diagnosis not present

## 2022-08-16 DIAGNOSIS — Z8249 Family history of ischemic heart disease and other diseases of the circulatory system: Secondary | ICD-10-CM

## 2022-08-16 DIAGNOSIS — G9341 Metabolic encephalopathy: Secondary | ICD-10-CM | POA: Diagnosis not present

## 2022-08-16 DIAGNOSIS — I499 Cardiac arrhythmia, unspecified: Secondary | ICD-10-CM | POA: Diagnosis not present

## 2022-08-16 DIAGNOSIS — Z794 Long term (current) use of insulin: Secondary | ICD-10-CM

## 2022-08-16 DIAGNOSIS — R739 Hyperglycemia, unspecified: Secondary | ICD-10-CM | POA: Diagnosis not present

## 2022-08-16 DIAGNOSIS — G40909 Epilepsy, unspecified, not intractable, without status epilepticus: Secondary | ICD-10-CM | POA: Diagnosis present

## 2022-08-16 DIAGNOSIS — Z9181 History of falling: Secondary | ICD-10-CM

## 2022-08-16 DIAGNOSIS — I4430 Unspecified atrioventricular block: Secondary | ICD-10-CM | POA: Diagnosis not present

## 2022-08-16 DIAGNOSIS — E1040 Type 1 diabetes mellitus with diabetic neuropathy, unspecified: Secondary | ICD-10-CM | POA: Diagnosis not present

## 2022-08-16 DIAGNOSIS — Z79899 Other long term (current) drug therapy: Secondary | ICD-10-CM

## 2022-08-16 DIAGNOSIS — Q8501 Neurofibromatosis, type 1: Secondary | ICD-10-CM | POA: Diagnosis not present

## 2022-08-16 DIAGNOSIS — R443 Hallucinations, unspecified: Secondary | ICD-10-CM | POA: Diagnosis not present

## 2022-08-16 DIAGNOSIS — R4182 Altered mental status, unspecified: Secondary | ICD-10-CM | POA: Diagnosis not present

## 2022-08-16 DIAGNOSIS — Z951 Presence of aortocoronary bypass graft: Secondary | ICD-10-CM

## 2022-08-16 DIAGNOSIS — Z9841 Cataract extraction status, right eye: Secondary | ICD-10-CM

## 2022-08-16 DIAGNOSIS — R109 Unspecified abdominal pain: Secondary | ICD-10-CM | POA: Diagnosis not present

## 2022-08-16 DIAGNOSIS — Z9641 Presence of insulin pump (external) (internal): Secondary | ICD-10-CM | POA: Diagnosis present

## 2022-08-16 DIAGNOSIS — M47812 Spondylosis without myelopathy or radiculopathy, cervical region: Secondary | ICD-10-CM | POA: Diagnosis not present

## 2022-08-16 DIAGNOSIS — F419 Anxiety disorder, unspecified: Secondary | ICD-10-CM | POA: Diagnosis present

## 2022-08-16 LAB — I-STAT VENOUS BLOOD GAS, ED
Acid-base deficit: 26 mmol/L — ABNORMAL HIGH (ref 0.0–2.0)
Acid-base deficit: 28 mmol/L — ABNORMAL HIGH (ref 0.0–2.0)
Bicarbonate: 3.9 mmol/L — ABNORMAL LOW (ref 20.0–28.0)
Bicarbonate: 4.7 mmol/L — ABNORMAL LOW (ref 20.0–28.0)
Calcium, Ion: 1.17 mmol/L (ref 1.15–1.40)
Calcium, Ion: 1.28 mmol/L (ref 1.15–1.40)
HCT: 40 % (ref 39.0–52.0)
HCT: 42 % (ref 39.0–52.0)
Hemoglobin: 13.6 g/dL (ref 13.0–17.0)
Hemoglobin: 14.3 g/dL (ref 13.0–17.0)
O2 Saturation: 75 %
O2 Saturation: 92 %
Patient temperature: 37
Potassium: 6.4 mmol/L (ref 3.5–5.1)
Potassium: 6.8 mmol/L (ref 3.5–5.1)
Sodium: 122 mmol/L — ABNORMAL LOW (ref 135–145)
Sodium: 125 mmol/L — ABNORMAL LOW (ref 135–145)
TCO2: 5 mmol/L — ABNORMAL LOW (ref 22–32)
TCO2: <5 mmol/L — ABNORMAL LOW (ref 22–32)
pCO2, Ven: 20.5 mmHg — ABNORMAL LOW (ref 44–60)
pCO2, Ven: 21.7 mmHg — ABNORMAL LOW (ref 44–60)
pH, Ven: 6.891 — CL (ref 7.25–7.43)
pH, Ven: 6.945 — CL (ref 7.25–7.43)
pO2, Ven: 66 mmHg — ABNORMAL HIGH (ref 32–45)
pO2, Ven: 98 mmHg — ABNORMAL HIGH (ref 32–45)

## 2022-08-16 LAB — CBC
HCT: 41.8 % (ref 39.0–52.0)
HCT: 39.5 % (ref 39.0–52.0)
Hemoglobin: 13.6 g/dL (ref 13.0–17.0)
Hemoglobin: 13.7 g/dL (ref 13.0–17.0)
MCH: 31.4 pg (ref 26.0–34.0)
MCH: 31.7 pg (ref 26.0–34.0)
MCHC: 32.5 g/dL (ref 30.0–36.0)
MCHC: 34.7 g/dL (ref 30.0–36.0)
MCV: 96.5 fL (ref 80.0–100.0)
MCV: 91.4 fL (ref 80.0–100.0)
Platelets: 302 10*3/uL (ref 150–400)
Platelets: 235 10*3/uL (ref 150–400)
RBC: 4.33 MIL/uL (ref 4.22–5.81)
RBC: 4.32 MIL/uL (ref 4.22–5.81)
RDW: 12.1 % (ref 11.5–15.5)
RDW: 12 % (ref 11.5–15.5)
WBC: 27.8 10*3/uL — ABNORMAL HIGH (ref 4.0–10.5)
WBC: 23.4 10*3/uL — ABNORMAL HIGH (ref 4.0–10.5)
nRBC: 0 % (ref 0.0–0.2)
nRBC: 0 % (ref 0.0–0.2)

## 2022-08-16 LAB — BASIC METABOLIC PANEL
Anion gap: 29 — ABNORMAL HIGH (ref 5–15)
Anion gap: 29 — ABNORMAL HIGH (ref 5–15)
Anion gap: 18 — ABNORMAL HIGH (ref 5–15)
Anion gap: 14 (ref 5–15)
Anion gap: 11 (ref 5–15)
BUN: 39 mg/dL — ABNORMAL HIGH (ref 6–20)
BUN: 39 mg/dL — ABNORMAL HIGH (ref 6–20)
BUN: 39 mg/dL — ABNORMAL HIGH (ref 6–20)
BUN: 36 mg/dL — ABNORMAL HIGH (ref 6–20)
BUN: 33 mg/dL — ABNORMAL HIGH (ref 6–20)
BUN: 29 mg/dL — ABNORMAL HIGH (ref 6–20)
CO2: <7 mmol/L — ABNORMAL LOW (ref 22–32)
CO2: 8 mmol/L — ABNORMAL LOW (ref 22–32)
CO2: 10 mmol/L — ABNORMAL LOW (ref 22–32)
CO2: 16 mmol/L — ABNORMAL LOW (ref 22–32)
CO2: 20 mmol/L — ABNORMAL LOW (ref 22–32)
CO2: 23 mmol/L (ref 22–32)
Calcium: 9.4 mg/dL (ref 8.9–10.3)
Calcium: 9.5 mg/dL (ref 8.9–10.3)
Calcium: 9.4 mg/dL (ref 8.9–10.3)
Calcium: 9.3 mg/dL (ref 8.9–10.3)
Calcium: 9.3 mg/dL (ref 8.9–10.3)
Calcium: 9.3 mg/dL (ref 8.9–10.3)
Chloride: 92 mmol/L — ABNORMAL LOW (ref 98–111)
Chloride: 92 mmol/L — ABNORMAL LOW (ref 98–111)
Chloride: 90 mmol/L — ABNORMAL LOW (ref 98–111)
Chloride: 96 mmol/L — ABNORMAL LOW (ref 98–111)
Chloride: 99 mmol/L (ref 98–111)
Chloride: 101 mmol/L (ref 98–111)
Creatinine, Ser: 3.15 mg/dL — ABNORMAL HIGH (ref 0.61–1.24)
Creatinine, Ser: 3.06 mg/dL — ABNORMAL HIGH (ref 0.61–1.24)
Creatinine, Ser: 3.04 mg/dL — ABNORMAL HIGH (ref 0.61–1.24)
Creatinine, Ser: 2.55 mg/dL — ABNORMAL HIGH (ref 0.61–1.24)
Creatinine, Ser: 2.09 mg/dL — ABNORMAL HIGH (ref 0.61–1.24)
Creatinine, Ser: 1.82 mg/dL — ABNORMAL HIGH (ref 0.61–1.24)
GFR, Estimated: 22 mL/min — ABNORMAL LOW (ref >60)
GFR, Estimated: 23 mL/min — ABNORMAL LOW (ref >60)
GFR, Estimated: 23 mL/min — ABNORMAL LOW (ref >60)
GFR, Estimated: 28 mL/min — ABNORMAL LOW (ref >60)
GFR, Estimated: 36 mL/min — ABNORMAL LOW (ref >60)
GFR, Estimated: 43 mL/min — ABNORMAL LOW (ref >60)
Glucose, Bld: 1048 mg/dL (ref 70–99)
Glucose, Bld: 937 mg/dL (ref 70–99)
Glucose, Bld: 850 mg/dL (ref 70–99)
Glucose, Bld: 610 mg/dL (ref 70–99)
Glucose, Bld: 420 mg/dL — ABNORMAL HIGH (ref 70–99)
Glucose, Bld: 144 mg/dL — ABNORMAL HIGH (ref 70–99)
Potassium: 6.7 mmol/L (ref 3.5–5.1)
Potassium: 5.6 mmol/L — ABNORMAL HIGH (ref 3.5–5.1)
Potassium: 4.7 mmol/L (ref 3.5–5.1)
Potassium: 4 mmol/L (ref 3.5–5.1)
Potassium: 3.5 mmol/L (ref 3.5–5.1)
Potassium: 3.4 mmol/L — ABNORMAL LOW (ref 3.5–5.1)
Sodium: 127 mmol/L — ABNORMAL LOW (ref 135–145)
Sodium: 129 mmol/L — ABNORMAL LOW (ref 135–145)
Sodium: 129 mmol/L — ABNORMAL LOW (ref 135–145)
Sodium: 130 mmol/L — ABNORMAL LOW (ref 135–145)
Sodium: 133 mmol/L — ABNORMAL LOW (ref 135–145)
Sodium: 135 mmol/L (ref 135–145)

## 2022-08-16 LAB — CBC WITH DIFFERENTIAL/PLATELET
Abs Immature Granulocytes: 0 10*3/uL (ref 0.00–0.07)
Basophils Absolute: 0.3 10*3/uL — ABNORMAL HIGH (ref 0.0–0.1)
Basophils Relative: 1 %
Eosinophils Absolute: 0 10*3/uL (ref 0.0–0.5)
Eosinophils Relative: 0 %
HCT: 44.9 % (ref 39.0–52.0)
Hemoglobin: 13.9 g/dL (ref 13.0–17.0)
Lymphocytes Relative: 10 %
Lymphs Abs: 2.7 10*3/uL (ref 0.7–4.0)
MCH: 31.7 pg (ref 26.0–34.0)
MCHC: 31 g/dL (ref 30.0–36.0)
MCV: 102.3 fL — ABNORMAL HIGH (ref 80.0–100.0)
Monocytes Absolute: 1.9 10*3/uL — ABNORMAL HIGH (ref 0.1–1.0)
Monocytes Relative: 7 %
Neutro Abs: 21.7 10*3/uL — ABNORMAL HIGH (ref 1.7–7.7)
Neutrophils Relative %: 82 %
Platelets: 397 10*3/uL (ref 150–400)
RBC: 4.39 MIL/uL (ref 4.22–5.81)
RDW: 12.2 % (ref 11.5–15.5)
WBC: 26.5 10*3/uL — ABNORMAL HIGH (ref 4.0–10.5)
nRBC: 0 % (ref 0.0–0.2)

## 2022-08-16 LAB — GLUCOSE, CAPILLARY
Glucose-Capillary: >600 mg/dL (ref 70–99)
Glucose-Capillary: >600 mg/dL (ref 70–99)
Glucose-Capillary: >600 mg/dL (ref 70–99)
Glucose-Capillary: >600 mg/dL (ref 70–99)
Glucose-Capillary: >600 mg/dL (ref 70–99)
Glucose-Capillary: >600 mg/dL (ref 70–99)
Glucose-Capillary: >600 mg/dL (ref 70–99)
Glucose-Capillary: >600 mg/dL (ref 70–99)
Glucose-Capillary: 555 mg/dL (ref 70–99)
Glucose-Capillary: >600 mg/dL (ref 70–99)
Glucose-Capillary: 577 mg/dL (ref 70–99)
Glucose-Capillary: >600 mg/dL (ref 70–99)
Glucose-Capillary: 556 mg/dL (ref 70–99)
Glucose-Capillary: 461 mg/dL — ABNORMAL HIGH (ref 70–99)
Glucose-Capillary: 482 mg/dL — ABNORMAL HIGH (ref 70–99)
Glucose-Capillary: 442 mg/dL — ABNORMAL HIGH (ref 70–99)
Glucose-Capillary: 370 mg/dL — ABNORMAL HIGH (ref 70–99)
Glucose-Capillary: 406 mg/dL — ABNORMAL HIGH (ref 70–99)
Glucose-Capillary: 350 mg/dL — ABNORMAL HIGH (ref 70–99)
Glucose-Capillary: 255 mg/dL — ABNORMAL HIGH (ref 70–99)
Glucose-Capillary: 258 mg/dL — ABNORMAL HIGH (ref 70–99)
Glucose-Capillary: 204 mg/dL — ABNORMAL HIGH (ref 70–99)
Glucose-Capillary: 135 mg/dL — ABNORMAL HIGH (ref 70–99)
Glucose-Capillary: 135 mg/dL — ABNORMAL HIGH (ref 70–99)
Glucose-Capillary: 145 mg/dL — ABNORMAL HIGH (ref 70–99)
Glucose-Capillary: 162 mg/dL — ABNORMAL HIGH (ref 70–99)
Glucose-Capillary: 186 mg/dL — ABNORMAL HIGH (ref 70–99)

## 2022-08-16 LAB — I-STAT CHEM 8, ED
BUN: 34 mg/dL — ABNORMAL HIGH (ref 6–20)
Calcium, Ion: 1.19 mmol/L (ref 1.15–1.40)
Chloride: 96 mmol/L — ABNORMAL LOW (ref 98–111)
Creatinine, Ser: 2.5 mg/dL — ABNORMAL HIGH (ref 0.61–1.24)
Glucose, Bld: >700 mg/dL (ref 70–99)
HCT: 43 % (ref 39.0–52.0)
Hemoglobin: 14.6 g/dL (ref 13.0–17.0)
Potassium: 6.8 mmol/L (ref 3.5–5.1)
Sodium: 123 mmol/L — ABNORMAL LOW (ref 135–145)
TCO2: 7 mmol/L — ABNORMAL LOW (ref 22–32)

## 2022-08-16 LAB — URINALYSIS, W/ REFLEX TO CULTURE (INFECTION SUSPECTED)
Glucose, UA: >=500 mg/dL — AB
Ketones, ur: 40 mg/dL — AB
Leukocytes,Ua: NEGATIVE
Nitrite: NEGATIVE
Protein, ur: 30 mg/dL — AB
Specific Gravity, Urine: >1.03 — ABNORMAL HIGH (ref 1.005–1.030)
pH: 5 (ref 5.0–8.0)

## 2022-08-16 LAB — COMPREHENSIVE METABOLIC PANEL
ALT: 35 U/L (ref 0–44)
AST: 50 U/L — ABNORMAL HIGH (ref 15–41)
Albumin: 3.5 g/dL (ref 3.5–5.0)
Alkaline Phosphatase: 89 U/L (ref 38–126)
BUN: 38 mg/dL — ABNORMAL HIGH (ref 6–20)
CO2: <7 mmol/L — ABNORMAL LOW (ref 22–32)
Calcium: 8.8 mg/dL — ABNORMAL LOW (ref 8.9–10.3)
Chloride: 88 mmol/L — ABNORMAL LOW (ref 98–111)
Creatinine, Ser: 3.02 mg/dL — ABNORMAL HIGH (ref 0.61–1.24)
GFR, Estimated: 23 mL/min — ABNORMAL LOW (ref >60)
Glucose, Bld: 1150 mg/dL (ref 70–99)
Potassium: 7 mmol/L (ref 3.5–5.1)
Sodium: 125 mmol/L — ABNORMAL LOW (ref 135–145)
Total Bilirubin: 0.9 mg/dL (ref 0.3–1.2)
Total Protein: 5.7 g/dL — ABNORMAL LOW (ref 6.5–8.1)

## 2022-08-16 LAB — BETA-HYDROXYBUTYRIC ACID
Beta-Hydroxybutyric Acid: >8 mmol/L — ABNORMAL HIGH (ref 0.05–0.27)
Beta-Hydroxybutyric Acid: >8 mmol/L — ABNORMAL HIGH (ref 0.05–0.27)
Beta-Hydroxybutyric Acid: 3.92 mmol/L — ABNORMAL HIGH (ref 0.05–0.27)
Beta-Hydroxybutyric Acid: 0.37 mmol/L — ABNORMAL HIGH (ref 0.05–0.27)

## 2022-08-16 LAB — MRSA NEXT GEN BY PCR, NASAL: MRSA by PCR Next Gen: NOT DETECTED

## 2022-08-16 LAB — RAPID URINE DRUG SCREEN, HOSP PERFORMED
Amphetamines: NOT DETECTED
Barbiturates: NOT DETECTED
Benzodiazepines: NOT DETECTED
Cocaine: NOT DETECTED
Opiates: NOT DETECTED
Tetrahydrocannabinol: NOT DETECTED

## 2022-08-16 LAB — TROPONIN I (HIGH SENSITIVITY)
Troponin I (High Sensitivity): 3458 ng/L (ref <18)
Troponin I (High Sensitivity): 3591 ng/L (ref <18)
Troponin I (High Sensitivity): 4027 ng/L (ref <18)

## 2022-08-16 LAB — CBG MONITORING, ED
Glucose-Capillary: >600 mg/dL (ref 70–99)
Glucose-Capillary: >600 mg/dL (ref 70–99)
Glucose-Capillary: >600 mg/dL (ref 70–99)
Glucose-Capillary: >600 mg/dL (ref 70–99)
Glucose-Capillary: >600 mg/dL (ref 70–99)

## 2022-08-16 LAB — LACTIC ACID, PLASMA
Lactic Acid, Venous: >9 mmol/L (ref 0.5–1.9)
Lactic Acid, Venous: >9 mmol/L (ref 0.5–1.9)
Lactic Acid, Venous: 7.7 mmol/L (ref 0.5–1.9)

## 2022-08-16 LAB — HEMOGLOBIN A1C
Hgb A1c MFr Bld: 7.4 % — ABNORMAL HIGH (ref 4.8–5.6)
Mean Plasma Glucose: 165.68 mg/dL

## 2022-08-16 LAB — CARBAMAZEPINE LEVEL, TOTAL: Carbamazepine Lvl: 7.3 ug/mL (ref 4.0–12.0)

## 2022-08-16 LAB — TSH: TSH: 3.749 u[IU]/mL (ref 0.350–4.500)

## 2022-08-16 LAB — AMMONIA: Ammonia: 78 umol/L — ABNORMAL HIGH (ref 9–35)

## 2022-08-16 LAB — MAGNESIUM: Magnesium: 2.6 mg/dL — ABNORMAL HIGH (ref 1.7–2.4)

## 2022-08-16 LAB — HIV ANTIBODY (ROUTINE TESTING W REFLEX): HIV Screen 4th Generation wRfx: NONREACTIVE

## 2022-08-16 LAB — CK: Total CK: 851 U/L — ABNORMAL HIGH (ref 49–397)

## 2022-08-16 LAB — ETHANOL: Alcohol, Ethyl (B): <10 mg/dL (ref <10)

## 2022-08-16 LAB — PROCALCITONIN: Procalcitonin: 2.19 ng/mL

## 2022-08-16 MED ORDER — DOCUSATE SODIUM 100 MG PO CAPS: 100.0000 mg | ORAL_CAPSULE | Freq: Two times a day (BID) | ORAL | Status: DC | PRN

## 2022-08-16 MED ORDER — SODIUM ZIRCONIUM CYCLOSILICATE 10 G PO PACK
10.0000 g | PACK | Freq: Once | ORAL | Status: AC
  Administered 2022-08-16: 10 g via ORAL
  Filled 2022-08-16: qty 1

## 2022-08-16 MED ORDER — SODIUM CHLORIDE 0.9 % IV SOLN: 2.0000 g | Freq: Once | INTRAVENOUS | Status: DC

## 2022-08-16 MED ORDER — CALCIUM GLUCONATE 10 % IV SOLN
1.0000 g | Freq: Once | INTRAVENOUS | Status: AC
  Administered 2022-08-16: 1 g via INTRAVENOUS

## 2022-08-16 MED ORDER — LACTULOSE 10 GM/15ML PO SOLN
30.0000 g | Freq: Once | ORAL | Status: AC
  Administered 2022-08-16: 30 g via ORAL
  Filled 2022-08-16: qty 45

## 2022-08-16 MED ORDER — VANCOMYCIN HCL IN DEXTROSE 1-5 GM/200ML-% IV SOLN: 1000.0000 mg | Freq: Once | INTRAVENOUS | Status: DC

## 2022-08-16 MED ORDER — METOPROLOL TARTRATE 25 MG PO TABS
25.0000 mg | ORAL_TABLET | Freq: Two times a day (BID) | ORAL | Status: DC
  Administered 2022-08-16 – 2022-08-23 (×15): 25 mg via ORAL
  Filled 2022-08-16 (×15): qty 1

## 2022-08-16 MED ORDER — CARBAMAZEPINE ER 200 MG PO TB12
600.0000 mg | ORAL_TABLET | Freq: Two times a day (BID) | ORAL | Status: DC
  Administered 2022-08-16 – 2022-08-23 (×16): 600 mg via ORAL
  Filled 2022-08-16 (×19): qty 3

## 2022-08-16 MED ORDER — ASPIRIN 81 MG PO CHEW
81.0000 mg | CHEWABLE_TABLET | Freq: Every day | ORAL | Status: DC
  Administered 2022-08-16 – 2022-08-23 (×8): 81 mg via ORAL
  Filled 2022-08-16 (×9): qty 1

## 2022-08-16 MED ORDER — DEXTROSE 50 % IV SOLN: 0.0000 mL | INTRAVENOUS | Status: DC | PRN

## 2022-08-16 MED ORDER — PNEUMOCOCCAL VAC POLYVALENT 25 MCG/0.5ML IJ INJ
0.5000 mL | INJECTION | INTRAMUSCULAR | Status: DC
  Filled 2022-08-16 (×2): qty 0.5

## 2022-08-16 MED ORDER — METRONIDAZOLE 500 MG/100ML IV SOLN: 500.0000 mg | Freq: Once | INTRAVENOUS | Status: DC

## 2022-08-16 MED ORDER — VANCOMYCIN HCL 2000 MG/400ML IV SOLN
2000.0000 mg | Freq: Once | INTRAVENOUS | Status: AC
  Administered 2022-08-16: 2000 mg via INTRAVENOUS
  Filled 2022-08-16 (×2): qty 400

## 2022-08-16 MED ORDER — HEPARIN SODIUM (PORCINE) 5000 UNIT/ML IJ SOLN
5000.0000 [IU] | Freq: Three times a day (TID) | INTRAMUSCULAR | Status: DC
  Administered 2022-08-16 – 2022-08-23 (×23): 5000 [IU] via SUBCUTANEOUS
  Filled 2022-08-16 (×23): qty 1

## 2022-08-16 MED ORDER — ROSUVASTATIN CALCIUM 20 MG PO TABS
40.0000 mg | ORAL_TABLET | Freq: Every day | ORAL | Status: DC
  Administered 2022-08-16 – 2022-08-22 (×7): 40 mg via ORAL
  Filled 2022-08-16 (×7): qty 2

## 2022-08-16 MED ORDER — EZETIMIBE 10 MG PO TABS
10.0000 mg | ORAL_TABLET | Freq: Every day | ORAL | Status: DC
  Administered 2022-08-16 – 2022-08-22 (×7): 10 mg via ORAL
  Filled 2022-08-16 (×7): qty 1

## 2022-08-16 MED ORDER — PIPERACILLIN-TAZOBACTAM 3.375 G IVPB
3.3750 g | Freq: Three times a day (TID) | INTRAVENOUS | Status: DC
  Administered 2022-08-16 – 2022-08-18 (×7): 3.375 g via INTRAVENOUS
  Filled 2022-08-16 (×7): qty 50

## 2022-08-16 MED ORDER — LEVETIRACETAM 750 MG PO TABS
750.0000 mg | ORAL_TABLET | Freq: Two times a day (BID) | ORAL | Status: DC
  Administered 2022-08-16 – 2022-08-23 (×16): 750 mg via ORAL
  Filled 2022-08-16 (×2): qty 1
  Filled 2022-08-16 (×2): qty 3
  Filled 2022-08-16 (×6): qty 1
  Filled 2022-08-16 (×2): qty 3
  Filled 2022-08-16 (×7): qty 1

## 2022-08-16 MED ORDER — LACTATED RINGERS IV BOLUS
1000.0000 mL | Freq: Once | INTRAVENOUS | Status: AC
  Administered 2022-08-16: 1000 mL via INTRAVENOUS

## 2022-08-16 MED ORDER — LACTATED RINGERS IV BOLUS
20.0000 mL/kg | Freq: Once | INTRAVENOUS | Status: AC
  Administered 2022-08-16: 2170 mL via INTRAVENOUS

## 2022-08-16 MED ORDER — LACTATED RINGERS IV SOLN: INTRAVENOUS | Status: DC

## 2022-08-16 MED ORDER — SODIUM CHLORIDE 0.9 % IV SOLN: 6.2500 mg | Freq: Four times a day (QID) | INTRAVENOUS | Status: DC | PRN

## 2022-08-16 MED ORDER — INSULIN REGULAR(HUMAN) IN NACL 100-0.9 UT/100ML-% IV SOLN
INTRAVENOUS | Status: DC
  Administered 2022-08-16: 15 [IU]/h via INTRAVENOUS
  Administered 2022-08-16: 8 [IU]/h via INTRAVENOUS
  Administered 2022-08-16: 21 [IU]/h via INTRAVENOUS
  Administered 2022-08-17: 4.8 [IU]/h via INTRAVENOUS
  Filled 2022-08-16 (×4): qty 100

## 2022-08-16 MED ORDER — ISOSORBIDE MONONITRATE ER 60 MG PO TB24
60.0000 mg | ORAL_TABLET | Freq: Every day | ORAL | Status: DC
  Administered 2022-08-16 – 2022-08-23 (×8): 60 mg via ORAL
  Filled 2022-08-16 (×8): qty 1

## 2022-08-16 MED ORDER — LACTATED RINGERS IV SOLN: INTRAVENOUS | Status: AC

## 2022-08-16 MED ORDER — CHLORHEXIDINE GLUCONATE CLOTH 2 % EX PADS
6.0000 | MEDICATED_PAD | Freq: Every day | CUTANEOUS | Status: DC
  Administered 2022-08-16 – 2022-08-23 (×8): 6 via TOPICAL

## 2022-08-16 MED ORDER — TAMSULOSIN HCL 0.4 MG PO CAPS
0.4000 mg | ORAL_CAPSULE | Freq: Every day | ORAL | Status: DC
  Administered 2022-08-16 – 2022-08-23 (×7): 0.4 mg via ORAL
  Filled 2022-08-16 (×8): qty 1

## 2022-08-16 MED ORDER — DEXTROSE IN LACTATED RINGERS 5 % IV SOLN
INTRAVENOUS | Status: DC
  Filled 2022-08-16: qty 1000

## 2022-08-16 MED ORDER — ONDANSETRON HCL 4 MG/2ML IJ SOLN
4.0000 mg | Freq: Four times a day (QID) | INTRAMUSCULAR | Status: DC | PRN
  Administered 2022-08-16: 4 mg via INTRAVENOUS
  Filled 2022-08-16 (×2): qty 2

## 2022-08-16 MED ORDER — POLYETHYLENE GLYCOL 3350 17 G PO PACK: 17.0000 g | PACK | Freq: Every day | ORAL | Status: DC | PRN

## 2022-08-16 MED ORDER — PIPERACILLIN-TAZOBACTAM 3.375 G IVPB 30 MIN
3.3750 g | Freq: Once | INTRAVENOUS | Status: AC
  Administered 2022-08-16: 3.375 g via INTRAVENOUS
  Filled 2022-08-16: qty 50

## 2022-08-16 MED ORDER — SODIUM BICARBONATE 8.4 % IV SOLN
50.0000 meq | Freq: Once | INTRAVENOUS | Status: AC
  Administered 2022-08-16: 50 meq via INTRAVENOUS

## 2022-08-16 NOTE — Inpatient Diabetes Management (Addendum)
Spoke with patient's wife at bedside.  She states that patient wears a medtronic insulin pump.  The site was evidently "kinked".  She does not know the settings and patient is currently confused and unable to tell me settings.  Called Dr. Silvestre Mesi office to get settings.    Thanks,  Jeff Perl, RN, BC-ADM Inpatient Diabetes Coordinator Pager [phone]  (8a-5p)   Addendum:  received call back from Dr. Silvestre Mesi office regarding Jeff [name] pump settings.  12a- 1.0 unit/hr 6a-   2.1 units/hr 10a- 2.1 units/ hr 12p- 2.2 units/ hr 5p-  2.4 units/hr Total 24 hour basal= 46.4 units/24 hours 1 unit/7 grams CHO CHO ratio- 24 mg/dL  Therefore for transition (once acidosis is cleared), consider Semglee 40 units 1-2 hours prior to d/c of insulin drip and Novolog sensitive q 4 hours.

## 2022-08-16 NOTE — Telephone Encounter (Signed)
Patient's wife is calling to let Dr. Burt Knack know that patient is admitted into [facility].

## 2022-08-16 NOTE — Progress Notes (Signed)
eLink Physician-Brief Progress Note Patient Name: Jeff [name] DOB: 1966-01-08 MRN: [id number]   Date of Service  08/16/2022  HPI/Events of Note  Patient arrived from ICU and has Insulin infusion and LR running. Feeling better. Taking oral medications and drinking from a cup. Seen by cardiology. Not recommended for cath and no chest pain or cardiac symptoms at this time. Significant metabolic abnormalities. Improving K. MAP in 70s. Awake and alert at this time. Has serial BMPs and LA ordered  eICU Interventions  Plan per CCM. D/w RN. Let me know when labs result     Intervention Category Major Interventions: Acid-Base disturbance - evaluation and management;Hyperglycemia - active titration of insulin therapy Evaluation Type: New Patient Evaluation  Margaretmary Lombard 08/16/2022, 4:18 AM

## 2022-08-16 NOTE — ED Notes (Signed)
PA Payne at bedside, made aware of VBG results.

## 2022-08-16 NOTE — Progress Notes (Signed)
Interventional Cardiology Note:  Called to patient bedside due to STEMI activation in the field.  Patient is a 68M with a history of early CABG failure with VG to Dx remaining patent with subsequent PCI LAD, HTN, HL, T2DM here with altered mental status and syncopal episode.  Patient denies any chest pain.  EKG demonstrates slurring of QRS (has known RBBB).  ABG in ED c/w DKA (pH 6.8/glucose>700).  Will defer emergent coronary angiography at this time.  Discussed with ED staff.   GEN: No acute distress.   HEENT:  MMM, no JVD, no scleral icterus Cardiac: RRR, no murmurs, rubs, or gallops.  Respiratory: Clear to auscultation bilaterally. GI: Soft, nontender, non-distended  MS: No edema; No deformity. Neuro:  Nonfocal  Vasc:  +2 radial pulses, cool to touch   Lenna Sciara, MD Pager [phone]

## 2022-08-16 NOTE — H&P (Signed)
NAME:  Jeff [name], MRN:  [id number], DOB:  02-13-1966, LOS: 0 ADMISSION DATE:  08/16/2022, CONSULTATION DATE:  2/19 REFERRING MD:  Dr. Betsey Holiday, CHIEF COMPLAINT:  DKA   History of Present Illness:  Patient is a 57 year old male with pertinent PMH T1DM on insulin pump at home, CAD hx of previous CABG and stenting, HTN, HLD, seizure disorder presents to [facility] ED on 2/19 DKA.  Patient brought to [facility] ED on 2/19 with AMS.  Patient states about 2/18 patient not feeling well.  Found patient down in bathroom.  EMS called and transferred to [facility].  Upon arrival to [facility] ED, patient altered and slow to respond to questions.  CBG greater than 600.  Patient's insulin pump was removed at arrival and cannula was found to be kinked and not in skin.  BP soft 106/45.  Respirations upper 20s.  ABG 6.89, 20.5, 66, 3.9.  NA 125, K7.0, CO2 less than 7, glucose 1150, BUN 38, creat 3.02, AG non calculable.  Beta H greater than 8. DKA protocol initiated and started on insulin gtt per Endo tool.  IV fluids given. EKG with slurring QRS and peaked T waves.  Troponin greater than 3000.  Cardiology evaluated patient on arrival for syncopal episode but agreed no STEMI and will hold on PCI at this time.  Patient given calcium and bicarb for hyperkalemia. WBC 26.5 and afebrile.  LA greater than 9.  Cultures obtained and started on broad-spectrum antibiotics.  CXR no significant findings.  CT head/cervical spine no acute abnormality.  PCCM consulted for ICU admission.  Pertinent  Medical History   Past Medical History:  Diagnosis Date   Anxiety    CAD (coronary artery disease)    Cath 2011.  LAD stent patent, distal LAD occlusion, D1 100%, OM branch 80 - 90%, SVG to diag patent, LIMA to LAD occluded, SVG to RCA occluded, SVG to OM was occluded.   No change from previous cath.  Managed medically.    Cataract    Depression    Diabetes mellitus type I ([place])    on insulin pump at  home   Diabetic retinopathy    s/p vitrectomy and  history of retinal surgery   Hemorrhoid    History of oxygen administration    nocutural use only at 2 l/m nasally.   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 rate (!) 29, height 5' 11"$  (1.803 m), weight 108.5 kg, SpO2 100 %.       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) Skin: no rashes or lesions    Resolved Hospital Problem list     Assessment & Plan:  DKA DMT1 -kinked insulin pump on arrival Plan: -Insulin per endotool -CBG monitoring -Aggressive IV fluid resuscitation -trend B-Hydroxy -serial bmp -check A1c  AKI Hyperkalemia Hyponatremia: likely due to hyperglycemia and hypovolemia Plan: -cont insulin gtt; given bicarb, calcium, lokelma -IV fluids -telemetry monitoring -serial BMP / trend urinary output -Replace electrolytes as indicated -Avoid nephrotoxic agents, ensure adequate renal perfusion  Acute encephalopathy: Likely due to DKA -CT head no acute  abnormality; ethanol WNL Plan: -Treat DKA as above -UDS pending -limit sedating meds -F/u cultures; cont abx; treat for possible sepsis -check ammonia and tsh  Sepsis: Unknown source of infection Plan: -Continue broad-spectrum antibiotics -Follow cultures -Check PCT, covid/flu/rsv -Trend WBC/fever curve  NSTEMI Plan: -likely related to DKA -cards consulted; no PCI intervention at this time -trend troponin  CAD: history of failed CABG in 2011 w/ PCI HTN HLD Plan: -resume home statin and zetia; consider resuming praluent -hold anti-hypertensives with soft bp  Hx of seizures Nuerofibromatosis type 1 Plan: -resume keppra and tegretol  Depression Anxiety Plan: -hold home pristiq    Best Practice (right click and "Reselect all SmartList Selections" daily)   Diet/type: NPO w/ oral meds DVT prophylaxis: prophylactic heparin  GI prophylaxis: N/A Lines: N/A Foley:  N/A Code Status:  full code Last date of multidisciplinary goals of care discussion [2/19 updated patient and wife at bedside.]  Labs   CBC: Recent Labs  Lab 08/16/22 0034 08/16/22 0035  WBC  --  26.5*  NEUTROABS  --  21.7*  HGB 14.3 13.9  14.6  HCT 42.0 44.9  43.0  MCV  --  102.3*  PLT  --  99991111    Basic Metabolic Panel: Recent Labs  Lab 08/16/22 0034 08/16/22 0035  NA 122* 125*  123*  K 6.8* 7.0*  6.8*  CL  --  88*  96*  CO2  --  <7*  GLUCOSE  --  1,150*  >700*  BUN  --  38*  34*  CREATININE  --  3.02*  2.50*  CALCIUM  --  8.8*   GFR: Estimated Creatinine Clearance: 33.4 mL/min (A) (by C-G formula based on SCr of 3.02 mg/dL (H)). Recent Labs  Lab 08/16/22 0035 08/16/22 0049  WBC 26.5*  --   LATICACIDVEN  --  >9.0*    Liver Function Tests: Recent Labs  Lab 08/16/22 0035  AST 50*  ALT 35  ALKPHOS 89  BILITOT 0.9  PROT 5.7*  ALBUMIN 3.5   No results for input(s): "LIPASE", "AMYLASE" in the last 168 hours. No results for input(s): "AMMONIA" in the last 168  hours.  ABG    Component Value Date/Time   PHART 7.371 10/16/2009 2043   PCO2ART 39.7 10/16/2009 2043   PO2ART 92.0 10/16/2009 2043   HCO3 3.9 (L) 08/16/2022 0034   TCO2 7 (L) 08/16/2022 0035   ACIDBASEDEF 28.0 (H) 08/16/2022 0034   O2SAT 75 08/16/2022 0034     Coagulation Profile: No results for input(s): "INR", "PROTIME" in the last 168 hours.  Cardiac Enzymes: No results for input(s): "CKTOTAL", "CKMB", "CKMBINDEX", "TROPONINI" in the last 168 hours.  HbA1C: Hgb A1c MFr Bld  Date/Time Value Ref Range Status  11/24/2011 10:24 PM 7.1 (H) <5.7 % Final    Comment:    (  NOTE)                                                                       According to the ADA Clinical Practice Recommendations for 2011, when HbA1c is used as a screening test:  >=6.5%   Diagnostic of Diabetes Mellitus           (if abnormal result is confirmed) 5.7-6.4%   Increased risk of developing Diabetes Mellitus References:Diagnosis and Classification of Diabetes Mellitus,Diabetes Care,2011,34(Suppl 1):S62-S69 and Standards of Medical Care in         Diabetes - 2011,Diabetes [id number] (Suppl 1):S11-S61.  06/02/2011 06:38 PM 6.4 (H) <5.7 % Final    Comment:    (NOTE)                                                                       According to the ADA Clinical Practice Recommendations for 2011, when HbA1c is used as a screening test:  >=6.5%   Diagnostic of Diabetes Mellitus           (if abnormal result is confirmed) 5.7-6.4%   Increased risk of developing Diabetes Mellitus References:Diagnosis and Classification of Diabetes Mellitus,Diabetes [id number] 1):S62-S69 and Standards of Medical Care in         Diabetes - 2011,Diabetes Care,2011,34 (Suppl 1):S11-S61.    CBG: Recent Labs  Lab 08/16/22 0031 08/16/22 0129 08/16/22 0158  GLUCAP >600* >600* >600*    Review of Systems:   Review of Systems  Constitutional:  Negative for fever.  Respiratory:  Negative for cough and  shortness of breath.   Cardiovascular:  Negative for chest pain.  Gastrointestinal:  Positive for nausea and vomiting.  Genitourinary:  Negative for dysuria.  Neurological:  Positive for weakness.     Past Medical History:  He,  has a past medical history of Anxiety, CAD (coronary artery disease), Cataract, Depression, Diabetes mellitus type I ([place]), Diabetic retinopathy, Hemorrhoid, History of oxygen administration, HTN (hypertension), Hyperlipidemia, Myocardial infarction ([place]), Neurofibromatosis, Seasonal allergic rhinitis, Seizure disorder ([place]), Sleep apnea, and Toe fracture, right.   Surgical History:   Past Surgical History:  Procedure Laterality Date   APPENDECTOMY     CARDIAC CATHETERIZATION     CATARACT EXTRACTION, BILATERAL     COLONOSCOPY WITH PROPOFOL N/A 07/21/2017   Procedure: COLONOSCOPY WITH PROPOFOL;  Surgeon: Milus Banister, MD;  Location: [facility] ENDOSCOPY;  Service: Endoscopy;  Laterality: N/A;   CORONARY ANGIOPLASTY     9'11,  CABPG grafts had failed.   CORONARY ARTERY BYPASS GRAFT     4 vessel 4/11   ESOPHAGOGASTRODUODENOSCOPY (EGD) WITH PROPOFOL N/A 12/05/2014   Procedure: ESOPHAGOGASTRODUODENOSCOPY (EGD) WITH PROPOFOL;  Surgeon: Milus Banister, MD;  Location: [facility] ENDOSCOPY;  Service: Endoscopy;  Laterality: N/A;   LAPAROSCOPIC APPENDECTOMY  11/24/2011   LEFT HEART CATHETERIZATION WITH CORONARY/GRAFT ANGIOGRAM N/A 02/22/2013   Procedure: LEFT HEART CATHETERIZATION WITH Beatrix Fetters;  Surgeon: Sherren Mocha, MD;  Location: [facility] CATH LAB;  Service: Cardiovascular;  Laterality: N/A;  release of right transverse carpal ligament     right eye vitrectomy and detached retina repair  10/2007   TONSILLECTOMY     uvulopalatoplasty surgery       Social History:   reports that he has never smoked. He has never used smokeless tobacco. He reports that he does not drink alcohol and does not use drugs.   Family History:  His family history includes Arthritis in his  sister; Coronary artery disease (age of onset: 26) in his father; Crohn's disease in his mother; Diabetes in his father; Hearing loss in his mother; Hypertension in his father; Squamous cell carcinoma in his brother.   Allergies Allergies  Allergen Reactions   Aspirin     Other reaction(s): Unknown   Doxepin Hcl     Other reaction(s): did not work and had some extra nocturia   Venlafaxine     Other reaction(s): Too much sedation   Latex Rash     Home Medications  Prior to Admission medications   Medication Sig Start Date End Date Taking? Authorizing Provider  ACCU-CHEK GUIDE test strip  11/22/19   [provider]  Accu-Chek Softclix Lancets lancets  11/22/19   [provider]  acetaminophen (TYLENOL) 500 MG tablet Take 1,000 mg by mouth every 6 (six) hours as needed for mild pain.    [provider]  ADVOCATE INSULIN SYRINGE 31G X 5/16" 1 ML [place]  09/25/21   [provider]  Alirocumab (PRALUENT) 150 MG/ML SOAJ Inject into the skin. Per patient taking every other week    [provider]  aspirin 81 MG tablet Take 81 mg by mouth daily.    [provider]  Blood Glucose Monitoring Suppl (ACCU-CHEK GUIDE) w/Device KIT  09/07/19   [provider]  Cholecalciferol (VITAMIN D) 2000 units CAPS Take 2,000 Units by mouth daily.     [provider]  cyanocobalamin 1000 MCG tablet Take 1,000 mcg by mouth daily.    [provider]  desvenlafaxine (PRISTIQ) 100 MG 24 hr tablet Take 100 mg by mouth daily. 04/03/16   [provider]  docusate sodium (COLACE) 100 MG capsule Take 100 mg by mouth at bedtime.    [provider]  ezetimibe (ZETIA) 10 MG tablet Take 10 mg by mouth at bedtime.    [provider]  isosorbide mononitrate (IMDUR) 60 MG 24 hr tablet Take 60 mg by mouth every morning.     [provider]  levETIRAcetam (KEPPRA) 750 MG tablet TAKE 1 TABLET(750 MG) BY MOUTH TWICE DAILY  05/31/22   Suzzanne Cloud, NP  lisinopril (PRINIVIL,ZESTRIL) 5 MG tablet Take 5 mg by mouth every morning.     [provider]  metoprolol tartrate (LOPRESSOR) 25 MG tablet TAKE 1 TABLET(25 MG) BY MOUTH TWICE DAILY 01/28/22   Sherren Mocha, MD  Multiple Vitamins-Minerals (CENTRUM SILVER 50+MEN) TABS Take 1 tablet by mouth every morning.    [provider]  nitroGLYCERIN (NITROSTAT) 0.4 MG SL tablet Place 1 tablet (0.4 mg total) under the tongue every 5 (five) minutes as needed for chest pain. 09/23/21   Sherren Mocha, MD  NOVOLOG 100 UNIT/ML injection  11/22/19   [provider]  OXYGEN Place 2 L into the nose at bedtime.    [provider]  PRALUENT 150 MG/ML SOAJ Inject 150 mg into the skin. 05/18/20   [provider]  rosuvastatin (CRESTOR) 40 MG tablet Take 40 mg by mouth at bedtime.  [provider]  TEGRETOL-XR 200 MG 12 hr tablet TAKE 3 TABLETS BY MOUTH TWO TIMES A DAY. 04/21/22   Suzzanne Cloud, NP     Critical care time: 45 minutes    JD Rexene Agent [facility] 08/16/2022, 2:10 AM  Please see [email or web address] for pager details.  From 7A-7P if no response, please call [phone]. After hours, please call ELink [phone].

## 2022-08-16 NOTE — Progress Notes (Signed)
   08/16/22 0025  Spiritual Encounters  Type of Visit Initial  Care provided to: Pt and family  Conversation partners present during encounter Nurse  Referral source Code page  Reason for visit Trauma  OnCall Visit Yes   Chp responded to STEMI code. Pt unavailable, but Pt's wife came into waiting room. Brought wife to consultation room and sat with her.  Chp assisted in Pt updates and was eventually able to escort wife to pt.s room.

## 2022-08-16 NOTE — ED Triage Notes (Signed)
Pt BIB EMS called for a syncopal episode in BR and abnormal EKG, assessed in bridge by EDP, pt given 400cc NS en route, v/s 120/46, HR 88, RR 32, 96% RA

## 2022-08-16 NOTE — Progress Notes (Addendum)
NAME:  Jeff [name], MRN:  [id number], DOB:  1966/06/12, LOS: 0 ADMISSION DATE:  08/16/2022, CONSULTATION DATE:  2/19 REFERRING MD:  Dr. Betsey Holiday, CHIEF COMPLAINT:  DKA   History of Present Illness:  Patient is a 57 year old male with pertinent PMH T1DM on insulin pump at home, CAD hx of previous CABG and stenting, HTN, HLD, seizure disorder presents to [facility] ED on 2/19 DKA.  Patient brought to [facility] ED on 2/19 with AMS.  Patient states about 2/18 patient not feeling well.  Found patient down in bathroom.  EMS called and transferred to [facility].  Upon arrival to [facility] ED, patient altered and slow to respond to questions.  CBG greater than 600.  Patient's insulin pump was removed at arrival and cannula was found to be kinked and not in skin.  BP soft 106/45.  Respirations upper 20s.  ABG 6.89, 20.5, 66, 3.9.  NA 125, K7.0, CO2 less than 7, glucose 1150, BUN 38, creat 3.02, AG non calculable.  Beta H greater than 8. DKA protocol initiated and started on insulin gtt per Endo tool.  IV fluids given. EKG with slurring QRS and peaked T waves.  Troponin greater than 3000.  Cardiology evaluated patient on arrival for syncopal episode but agreed no STEMI and will hold on PCI at this time.  Patient given calcium and bicarb for hyperkalemia. WBC 26.5 and afebrile.  LA greater than 9.  Cultures obtained and started on broad-spectrum antibiotics.  CXR no significant findings.  CT head/cervical spine no acute abnormality.  PCCM consulted for ICU admission.  Pertinent  Medical History   Past Medical History:  Diagnosis Date   Anxiety    CAD (coronary artery disease)    Cath 2011.  LAD stent patent, distal LAD occlusion, D1 100%, OM branch 80 - 90%, SVG to diag patent, LIMA to LAD occluded, SVG to RCA occluded, SVG to OM was occluded.   No change from previous cath.  Managed medically.    Cataract    Depression    Diabetes mellitus type I ([place])    on insulin pump at  home   Diabetic retinopathy    s/p vitrectomy and  history of retinal surgery   Hemorrhoid    History of oxygen administration    nocutural use only at 2 l/m nasally.   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 DKA  Interim History / Subjective:  Persistently high CBGs   Objective   Blood pressure (!) 127/45, pulse 76, temperature (!) 97.5 F (36.4 C), temperature source Oral, resp. rate (!) 24, height 5' 11"$  (1.803 m), weight 107 kg, SpO2 94 %.        Intake/Output Summary (Last 24 hours) at 08/16/2022 1103 Last data filed at 08/16/2022 0951 Gross per 24 hour  Intake 4828.7 ml  Output 1150 ml  Net 3678.7 ml   Filed Weights   08/16/22 0037 08/16/22 0400  Weight: 108.5 kg 107 kg    Examination: General:  ill appearing M  HEENT: NCAT dry mm  Neuro: Lethargic, awakens to voice and is oriented x2-3, following commands  CV: rr cap refill < 3 sec  PULM:  CTAb, even unlabored but incr RR  GI: soft ndnt  Extremities: no acute deformity  Skin: c/d/w. Chronic changes due to neurofibromatosis     Resolved Hospital Problem list     Assessment & Plan:   Acute metabolic encephalopathy -due to below  P -supportive care -will need a sitter given impulsivity to try and get out of bed   DKA DM1  -kinked insulin pump on arrival Plan: -having to incr insulin gtt beyond current endotool parameters  -add'l bolus IVF -serial BMP mag bhb  -DM coord consulted  Leukocytosis Concern for sepsis  -elevated WBC + a PCT of 2.19 -possible this is sepsis, but think more likely that this is reactive in  setting of his severe DKA P -fine to cont empiric zosyn for now but would have low threshold to de-escalate if next WBC, PCT are assuring or if micro data is unremarkable  -no futher vanc, MRSA PCR neg   AKI Hyponatremia Hyperkalemia -again think due to DKA -in d/w wife it sounds possible that he may have been grounded between toilet and wall, unclear duration  P -IVF, insulin -follow lytes as above -minimize nephrotoxic agents -send a CK, though would not particularly change management   Type 2 nstemi  -related to DKA -cards was engaged due to STEMI activation in field but cath deferred as picture c/w DKA as above   CAD HTN HLD P -resume home statin and zetia -- parulent is q2wk injection, will defer this  -add back home imdur, metop, hold lisinopril    Hx of seizures Nuerofibromatosis type 1 Plan: -home keppra and tegretol  Depression Anxiety Plan: -hold home pristiq for now    Best Practice (right click and "Reselect all SmartList Selections" daily)   Diet/type: NPO w/ oral meds DVT prophylaxis: prophylactic heparin  GI prophylaxis: N/A Lines: N/A Foley:  N/A Code Status:  full code Last date of multidisciplinary goals of care discussion [2/19 updated patient and wife at bedside.]  Labs   CBC: Recent Labs  Lab 08/16/22 0034 08/16/22 0035 08/16/22 0241 08/16/22 0455 08/16/22 0638  WBC  --  26.5*  --  27.8* 23.4*  NEUTROABS  --  21.7*  --   --   --   HGB 14.3 13.9  14.6 13.6 13.6 13.7  HCT 42.0 44.9  43.0 40.0 41.8 39.5  MCV  --  102.3*  --  96.5 91.4  PLT  --  397  --  302 [id number]    Basic Metabolic Panel: Recent Labs  Lab 08/16/22 0035 08/16/22 0227 08/16/22 0241 08/16/22 0455 08/16/22 0638  NA 125*  123* 127* 125* 129* 129*  K 7.0*  6.8* 6.7* 6.4* 5.6* 4.7  CL 88*  96* 92*  --  92* 90*  CO2 <7* <7*  --  8* 10*  GLUCOSE 1,150*  >700* 1,048*  --  937* 850*  BUN 38*  34* 39*  --  39* 39*  CREATININE 3.02*  2.50* 3.15*  --  3.06*  3.04*  CALCIUM 8.8* 9.4  --  9.5 9.4  MG  --   --   --  2.6*  --    GFR: Estimated Creatinine Clearance: 33 mL/min (A) (by C-G formula based on SCr of 3.04 mg/dL (H)). Recent Labs  Lab 08/16/22 0035 08/16/22 0049 08/16/22 0227 08/16/22 0455 08/16/22 [id number]  PROCALCITON  --   --   --  2.19  --   WBC 26.5*  --   --  27.8* 23.4*  LATICACIDVEN  --  >9.0* >9.0* 7.7*  --     Liver Function Tests: Recent Labs  Lab 08/16/22 0035  AST 50*  ALT 35  ALKPHOS 89  BILITOT 0.9  PROT 5.7*  ALBUMIN 3.5   No results for input(s): "LIPASE", "AMYLASE" in the last 168 hours. Recent Labs  Lab 08/16/22 0455  AMMONIA 78*    ABG    Component Value Date/Time   PHART 7.371 10/16/2009 2043   PCO2ART 39.7 10/16/2009 2043   PO2ART 92.0 10/16/2009 2043   HCO3 4.7 (L) 08/16/2022 0241   TCO2 5 (L) 08/16/2022 0241   ACIDBASEDEF 26.0 (H) 08/16/2022 0241   O2SAT 92 08/16/2022 0241     Coagulation Profile: No results for input(s): "INR", "PROTIME" in the last 168 hours.  Cardiac Enzymes: No results for input(s): "CKTOTAL", "CKMB", "CKMBINDEX", "TROPONINI" in the last 168 hours.  HbA1C: Hgb A1c MFr Bld  Date/Time Value Ref Range Status  08/16/2022 04:55 AM 7.4 (H) 4.8 - 5.6 % Final    Comment:    (NOTE) Pre diabetes:          5.7%-6.4%  Diabetes:              >6.4%  Glycemic control for   <7.0% adults with diabetes   11/24/2011 10:24 PM 7.1 (H) <5.7 % Final    Comment:    (NOTE)                                                                       According to the ADA Clinical Practice Recommendations for 2011, when HbA1c is used as a screening test:  >=6.5%   Diagnostic of Diabetes Mellitus           (if abnormal result is confirmed) 5.7-6.4%   Increased risk of developing Diabetes Mellitus References:Diagnosis and Classification of Diabetes Mellitus,Diabetes [id number] 1):S62-S69 and Standards of Medical Care in         Diabetes - 2011,Diabetes [id number] (Suppl  1):S11-S61.    CBG: Recent Labs  Lab 08/16/22 0846 08/16/22 0916 08/16/22 0946 08/16/22 1015 08/16/22 1101  GLUCAP >600* 555* >600* 577* 556*    CRITICAL CARE Performed by: Cristal Generous   Total critical care time: 36 minutes  Critical care time was exclusive of separately billable procedures and treating other patients. Critical care was necessary to treat or prevent imminent or life-threatening deterioration.  Critical care was time spent personally by me on the following activities: development of treatment plan with patient and/or surrogate as well as nursing, discussions with consultants, evaluation of patient's response to treatment, examination of patient, obtaining history from patient or surrogate, ordering and performing treatments and interventions, ordering and review of laboratory studies, ordering and review of radiographic studies, pulse oximetry and re-evaluation of patient's condition.  Eliseo Gum MSN, AGACNP-BC [place] for pager 08/16/2022, 11:03 AM

## 2022-08-16 NOTE — Progress Notes (Signed)
eLink Physician-Brief Progress Note Patient Name: Jeff [name] DOB: May 11, 1966 MRN: [id number]   Date of Service  08/16/2022  HPI/Events of Note  Bladder scan >400, unable to void  eICU Interventions  Straight cath x 1      Intervention Category Intermediate Interventions: Oliguria - evaluation and management  Margaretmary Lombard 08/16/2022, 9:35 PM

## 2022-08-16 NOTE — Progress Notes (Signed)
Pharmacy Antibiotic Note  Jeff [name] is a 57 y.o. male admitted on 08/16/2022 with sepsis.  Pharmacy has been consulted for Zosyn dosing.  Plan: Zosyn 3.375g IV q8h (4 hour infusion).  Height: 5' 11"$  (180.3 cm) Weight: 107 kg (235 lb 14.3 oz) IBW/kg (Calculated) : 75.3  Temp (24hrs), Avg:98 F (36.7 C), Min:98 F (36.7 C), Max:98 F (36.7 C)  Recent Labs  Lab 08/16/22 0035 08/16/22 0049 08/16/22 0227  WBC 26.5*  --   --   CREATININE 3.02*  2.50*  --  3.15*  LATICACIDVEN  --  >9.0* >9.0*    Estimated Creatinine Clearance: 31.8 mL/min (A) (by C-G formula based on SCr of 3.15 mg/dL (H)).    Allergies  Allergen Reactions   Doxepin Hcl     Other reaction(s): did not work and had some extra nocturia   Venlafaxine     Other reaction(s): Too much sedation   Latex Rash   Thank you for allowing pharmacy to be a part of this patient's care.  Wynona Neat, PharmD, BCPS  08/16/2022 4:34 AM

## 2022-08-16 NOTE — ED Provider Notes (Signed)
[facility] Provider Note   CSN: [id number] Arrival date & time: 08/16/22  0025     History  Chief Complaint  Patient presents with   Hyperglycemia    Jeff [name] is a 57 y.o. male.  Patient brought to the emergency department for evaluation of altered mental status.  Patient's wife reported to EMS that he had not been feeling well all day.  He went to the bathroom and she found him wedged between the commode and the wall.  EMS reports that the patient has been altered, slow to respond to questions.  Blood sugar critically high.  He is a type I diabetic.       Home Medications Prior to Admission medications   Medication Sig Start Date End Date Taking? Authorizing Provider  ACCU-CHEK GUIDE test strip  11/22/19   [provider]  Accu-Chek Softclix Lancets lancets  11/22/19   [provider]  acetaminophen (TYLENOL) 500 MG tablet Take 1,000 mg by mouth every 6 (six) hours as needed for mild pain.    [provider]  ADVOCATE INSULIN SYRINGE 31G X 5/16" 1 ML Diagonal  09/25/21   [provider]  Alirocumab (PRALUENT) 150 MG/ML SOAJ Inject into the skin. Per patient taking every other week    [provider]  aspirin 81 MG tablet Take 81 mg by mouth daily.    [provider]  Blood Glucose Monitoring Suppl (ACCU-CHEK GUIDE) w/Device KIT  09/07/19   [provider]  Cholecalciferol (VITAMIN D) 2000 units CAPS Take 2,000 Units by mouth daily.     [provider]  cyanocobalamin 1000 MCG tablet Take 1,000 mcg by mouth daily.    [provider]  desvenlafaxine (PRISTIQ) 100 MG 24 hr tablet Take 100 mg by mouth daily. 04/03/16   [provider]  docusate sodium (COLACE) 100 MG capsule Take 100 mg by mouth at bedtime.    [provider]  ezetimibe (ZETIA) 10 MG tablet Take 10 mg by mouth at bedtime.    [provider]  isosorbide mononitrate  (IMDUR) 60 MG 24 hr tablet Take 60 mg by mouth every morning.     [provider]  levETIRAcetam (KEPPRA) 750 MG tablet TAKE 1 TABLET(750 MG) BY MOUTH TWICE DAILY 05/31/22   Suzzanne Cloud, NP  lisinopril (PRINIVIL,ZESTRIL) 5 MG tablet Take 5 mg by mouth every morning.     [provider]  metoprolol tartrate (LOPRESSOR) 25 MG tablet TAKE 1 TABLET(25 MG) BY MOUTH TWICE DAILY 01/28/22   Sherren Mocha, MD  Multiple Vitamins-Minerals (CENTRUM SILVER 50+MEN) TABS Take 1 tablet by mouth every morning.    [provider]  nitroGLYCERIN (NITROSTAT) 0.4 MG SL tablet Place 1 tablet (0.4 mg total) under the tongue every 5 (five) minutes as needed for chest pain. 09/23/21   Sherren Mocha, MD  NOVOLOG 100 UNIT/ML injection  11/22/19   [provider]  OXYGEN Place 2 L into the nose at bedtime.    [provider]  PRALUENT 150 MG/ML SOAJ Inject 150 mg into the skin. 05/18/20   [provider]  rosuvastatin (CRESTOR) 40 MG tablet Take 40 mg by mouth at bedtime.     [provider]  TEGRETOL-XR 200 MG 12 hr tablet TAKE 3 TABLETS BY MOUTH TWO TIMES A DAY. 04/21/22   Suzzanne Cloud, NP      Allergies    Aspirin, Doxepin hcl, Venlafaxine, and Latex  Review of Systems   Review of Systems  Physical Exam Updated Vital Signs BP (!) 106/45   Pulse 80   Temp 98 F (36.7 C) (Axillary)   Resp (!) 29   Ht 5' 11"$  (1.803 m)   Wt 108.5 kg   SpO2 100%   BMI 33.36 kg/m  Physical Exam Vitals and nursing note reviewed.  Constitutional:      General: He is not in acute distress.    Appearance: He is well-developed.  HENT:     Head: Normocephalic and atraumatic.     Mouth/Throat:     Mouth: Mucous membranes are moist.  Eyes:     General: Vision grossly intact. Gaze aligned appropriately.     Extraocular Movements: Extraocular movements intact.     Conjunctiva/sclera: Conjunctivae normal.  Cardiovascular:     Rate and Rhythm: Normal rate and  regular rhythm.     Pulses: Normal pulses.     Heart sounds: Normal heart sounds, S1 normal and S2 normal. No murmur heard.    No friction rub. No gallop.  Pulmonary:     Effort: Pulmonary effort is normal. Tachypnea present. No respiratory distress.     Breath sounds: Normal breath sounds.  Abdominal:     Palpations: Abdomen is soft.     Tenderness: There is no abdominal tenderness. There is no guarding or rebound.     Hernia: No hernia is present.  Musculoskeletal:        General: No swelling.     Cervical back: Full passive range of motion without pain, normal range of motion and neck supple. No pain with movement, spinous process tenderness or muscular tenderness. Normal range of motion.     Right lower leg: No edema.     Left lower leg: No edema.  Skin:    General: Skin is warm and dry.     Capillary Refill: Capillary refill takes less than 2 seconds.     Findings: No ecchymosis, erythema, lesion or wound.  Neurological:     Mental Status: He is lethargic.     Cranial Nerves: Cranial nerves 2-12 are intact.     Sensory: Sensation is intact.     Motor: Motor function is intact. No weakness or abnormal muscle tone.     ED Results / Procedures / Treatments   Labs (all labs ordered are listed, but only abnormal results are displayed) Labs Reviewed  CBC WITH DIFFERENTIAL/PLATELET - Abnormal; Notable for the following components:      Result Value   WBC 26.5 (*)    MCV 102.3 (*)    Neutro Abs 21.7 (*)    Monocytes Absolute 1.9 (*)    Basophils Absolute 0.3 (*)    All other components within normal limits  COMPREHENSIVE METABOLIC PANEL - Abnormal; Notable for the following components:   Sodium 125 (*)    Potassium 7.0 (*)    Chloride 88 (*)    CO2 <7 (*)    Glucose, Bld 1,150 (*)    BUN 38 (*)    Creatinine, Ser 3.02 (*)    Calcium 8.8 (*)    Total Protein 5.7 (*)    AST 50 (*)    GFR, Estimated 23 (*)    All other components within normal limits  LACTIC ACID,  PLASMA - Abnormal; Notable for the following components:   Lactic Acid, Venous >9.0 (*)    All other components within normal limits  I-STAT CHEM 8, ED - Abnormal; Notable for the  following components:   Sodium 123 (*)    Potassium 6.8 (*)    Chloride 96 (*)    BUN 34 (*)    Creatinine, Ser 2.50 (*)    Glucose, Bld >700 (*)    TCO2 7 (*)    All other components within normal limits  I-STAT VENOUS BLOOD GAS, ED - Abnormal; Notable for the following components:   pH, Ven 6.891 (*)    pCO2, Ven 20.5 (*)    pO2, Ven 66 (*)    Bicarbonate 3.9 (*)    TCO2 <5 (*)    Acid-base deficit 28.0 (*)    Sodium 122 (*)    Potassium 6.8 (*)    All other components within normal limits  CBG MONITORING, ED - Abnormal; Notable for the following components:   Glucose-Capillary >600 (*)    All other components within normal limits  CBG MONITORING, ED - Abnormal; Notable for the following components:   Glucose-Capillary >600 (*)    All other components within normal limits  CBG MONITORING, ED - Abnormal; Notable for the following components:   Glucose-Capillary >600 (*)    All other components within normal limits  TROPONIN I (HIGH SENSITIVITY) - Abnormal; Notable for the following components:   Troponin I (High Sensitivity) 3,458 (*)    All other components within normal limits  CULTURE, BLOOD (SINGLE)  ETHANOL  AMMONIA  BETA-HYDROXYBUTYRIC ACID  RAPID URINE DRUG SCREEN, HOSP PERFORMED  URINALYSIS, W/ REFLEX TO CULTURE (INFECTION SUSPECTED)  BASIC METABOLIC PANEL  I-STAT VENOUS BLOOD GAS, ED  TROPONIN I (HIGH SENSITIVITY)    EKG EKG Interpretation  Date/Time:  Monday August 16 2022 00:45:17 EST Ventricular Rate:  97 PR Interval:  69 QRS Duration: 142 QT Interval:  483 QTC Calculation: 614 R Axis:   121 Text Interpretation: Ectopic atrial rhythm Short PR interval Nonspecific intraventricular conduction delay Repol abnrm, severe global ischemia (LM/MVD) Confirmed by Orpah Greek [phone]) on 08/16/2022 12:51:24 AM  Radiology CT HEAD WO CONTRAST (5MM)  Result Date: 08/16/2022 CLINICAL DATA:  Trauma EXAM: CT HEAD WITHOUT CONTRAST CT CERVICAL SPINE WITHOUT CONTRAST TECHNIQUE: Multidetector CT imaging of the head and cervical spine was performed following the standard protocol without intravenous contrast. Multiplanar CT image reconstructions of the cervical spine were also generated. RADIATION DOSE REDUCTION: This exam was performed according to the departmental dose-optimization program which includes automated exposure control, adjustment of the mA and/or kV according to patient size and/or use of iterative reconstruction technique. COMPARISON:  10/12/2009 CT head, no prior CT cervical spine, correlation is made with MRI head 03/26/2018 FINDINGS: CT HEAD FINDINGS Brain: No evidence of acute infarct, hemorrhage, mass, mass effect, or midline shift. No hydrocephalus or extra-axial fluid collection. Vascular: No hyperdense vessel. Skull: Negative for fracture. Remodeling in the left clivus, including the posterior aspect of the carotid canal, better evaluated on the prior MRI and consistent with a neurofibroma. 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 Sinuses/Orbits: No acute finding. Right scleral band. Status post bilateral lens replacements. Other: The mastoid air cells are well aerated. CT CERVICAL SPINE FINDINGS Alignment: No listhesis. Skull base and vertebrae: No acute fracture. No primary bone lesion or focal pathologic process. Soft tissues and spinal canal: No prevertebral fluid or swelling. No visible canal hematoma. Disc levels: Degenerative changes, with disc height loss most  prominently at C6-C7. Upper chest: Negative. Other: None. IMPRESSION: 1. No acute intracranial process. 2. No acute fracture or  traumatic listhesis in the cervical spine. Electronically Signed   By: Merilyn Baba M.D.   On: 08/16/2022 01:54   CT CERVICAL SPINE WO CONTRAST  Result Date: 08/16/2022 CLINICAL DATA:  Trauma EXAM: CT HEAD WITHOUT CONTRAST CT CERVICAL SPINE WITHOUT CONTRAST TECHNIQUE: Multidetector CT imaging of the head and cervical spine was performed following the standard protocol without intravenous contrast. Multiplanar CT image reconstructions of the cervical spine were also generated. RADIATION DOSE REDUCTION: This exam was performed according to the departmental dose-optimization program which includes automated exposure control, adjustment of the mA and/or kV according to patient size and/or use of iterative reconstruction technique. COMPARISON:  10/12/2009 CT head, no prior CT cervical spine, correlation is made with MRI head 03/26/2018 FINDINGS: CT HEAD FINDINGS Brain: No evidence of acute infarct, hemorrhage, mass, mass effect, or midline shift. No hydrocephalus or extra-axial fluid collection. Vascular: No hyperdense vessel. Skull: Negative for fracture. Remodeling in the left clivus, including the posterior aspect of the carotid canal, better evaluated on the prior MRI and consistent with a neurofibroma. 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 Sinuses/Orbits: No acute finding. Right scleral band. Status post bilateral lens replacements. Other: The mastoid air cells are well aerated. CT CERVICAL SPINE FINDINGS Alignment: No listhesis. Skull base and vertebrae: No acute fracture. No primary bone lesion or focal pathologic process. Soft tissues and spinal canal: No prevertebral fluid or swelling. No visible canal hematoma. Disc levels: Degenerative changes, with disc height loss most prominently at C6-C7. Upper chest: Negative. Other: None.  IMPRESSION: 1. No acute intracranial process. 2. No acute fracture or traumatic listhesis in the cervical spine. Electronically Signed   By: Merilyn Baba M.D.   On: 08/16/2022 01:54   DG Chest Port 1 View  Result Date: 08/16/2022 CLINICAL DATA:  Shortness of breath. EXAM: PORTABLE CHEST 1 VIEW COMPARISON:  Chest radiograph dated 05/26/2016. FINDINGS: No focal consolidation, pleural effusion, or pneumothorax. Mild cardiomegaly. Median sternotomy wires and CABG vascular clips. No acute osseous pathology. IMPRESSION: 1. No active disease. 2. Mild cardiomegaly. Electronically Signed   By: Anner Crete M.D.   On: 08/16/2022 01:10    Procedures .Critical Care  Performed by: Orpah Greek, MD Authorized by: Orpah Greek, MD   Critical care provider statement:    Critical care time (minutes):  30   Critical care was necessary to treat or prevent imminent or life-threatening deterioration of the following conditions:  Endocrine crisis   Critical care was time spent personally by me on the following activities:  Development of treatment plan with patient or surrogate, discussions with consultants, evaluation of patient's response to treatment, examination of patient, ordering and review of laboratory studies, ordering and review of radiographic studies, ordering and performing treatments and interventions, pulse oximetry, re-evaluation of patient's condition and review of old charts   I assumed direction of critical care for this patient from another provider in my specialty: no     Care discussed with: admitting provider       Medications Ordered in ED Medications  insulin regular, human (MYXREDLIN) 100 units/ 100 mL infusion (8 Units/hr Intravenous New Bag/Given 08/16/22 0100)  lactated ringers infusion ( Intravenous New Bag/Given 08/16/22 0131)  dextrose 5 %  in lactated ringers infusion (has no administration in time range)  dextrose 50 % solution 0-50 mL (has no  administration in time range)  lactated ringers infusion (has no administration in time range)  ceFEPIme (MAXIPIME) 2 g in sodium chloride 0.9 % 100 mL IVPB (has no administration in time range)  metroNIDAZOLE (FLAGYL) IVPB 500 mg (has no administration in time range)  vancomycin (VANCOREADY) IVPB 2000 mg/400 mL (has no administration in time range)  lactated ringers bolus 2,170 mL (2,170 mLs Intravenous New Bag/Given 08/16/22 0049)  calcium gluconate inj 10% (1 g) URGENT USE ONLY! (1 g Intravenous Given 08/16/22 0041)  sodium bicarbonate injection 50 mEq (50 mEq Intravenous Given 08/16/22 0043)    ED Course/ Medical Decision Making/ A&P                             Medical Decision Making Amount and/or Complexity of Data Reviewed External Data Reviewed: labs, ECG and notes. Labs: ordered. Decision-making details documented in ED Course. Radiology: ordered and independent interpretation performed. Decision-making details documented in ED Course. ECG/medicine tests: ordered and independent interpretation performed. Decision-making details documented in ED Course.  Risk Prescription drug management. Decision regarding hospitalization.   Brought to the emergency from home by EMS.  Patient lethargic at arrival, information provided initially by EMS and then by his wife when she arrived in the ED.  Patient had not been feeling well all day.  He did have an episode of vomiting earlier.  Wife reports that he got weaker and weaker through the day and she had to assist him to the bathroom.  He then fell off the commode and could not get back up.  Patient with abnormal EKG prehospital.  Patient with peaked T waves and wide QRS.  No known history of kidney failure.  At arrival, patient is lethargic and tachypneic, suspected Kussmaul's breathing.  Prehospital glucose greater than 600.  Patient's insulin pump was removed at arrival, the cannula was found to be kinked and not in the skin, likely the  cause of the hyperglycemia.  Cardiology evaluated patient at arrival.  They did agree that this was not a STEMI, EKG consistent with metabolic changes.  I-STAT Chem-8 and VBG at arrival reveals profound acidosis, acute kidney injury and hyperkalemia.  Hyperkalemia treated with calcium and bicarb at arrival, initiated on IV fluids and insulin drip.  CT head and cervical spine performed.  No acute injury.  Wife now present and indicates that he did have some complaints of abdominal pain earlier today.  Will perform CT abdomen and pelvis.  No contrast because of AKI.  Patient not alert enough for oral contrast.  Patient with leukocytosis and significantly elevated lactic acid.  Provided empiric antibiotic therapy to cover for sepsis.  Patient with troponin greater than 3000.  This was discussed with Dr. Patsey Berthold, on-call for cardiology.  His recommendation is to treat DKA.  No heparin.  Patient is not currently experiencing any chest pain.  Repeat VBG shows an improving pH and potassium, 6.9 and 6.4 respectively.  Will give additional fluid, continue insulin drip.  QRS complex improved on monitor, now back to baseline.  Patient will be admitted to the ICU.        Final Clinical Impression(s) / ED Diagnoses Final diagnoses:  Diabetic ketoacidosis without coma associated with type 1 diabetes mellitus ([place])  Hyperkalemia    Rx / DC Orders ED Discharge Orders     None  Orpah Greek, MD 08/16/22 [phone]

## 2022-08-16 NOTE — Progress Notes (Signed)
Pt being followed by [facility] for Sepsis protocol. 

## 2022-08-16 NOTE — Progress Notes (Addendum)
Inpatient Diabetes Program Recommendations  AACE/ADA: New Consensus Statement on Inpatient Glycemic Control (2015)  Target Ranges:  Prepandial:   less than 140 mg/dL      Peak postprandial:   less than 180 mg/dL (1-2 hours)      Critically ill patients:  140 - 180 mg/dL   Lab Results  Component Value Date   GLUCAP >600 (HH) 08/16/2022   HGBA1C 7.4 (H) 08/16/2022    Review of Glycemic Control  Latest Reference Range & Units 08/16/22 08:13 08/16/22 08:46 08/16/22 09:16 08/16/22 09:46  Glucose-Capillary 70 - 99 mg/dL >600 (HH) >600 (HH) 555 (HH) >600 (HH)   Diabetes history: Type 1 DM Outpatient Diabetes medications:  Insulin pump- Current orders for Inpatient glycemic control:  IV insulin - DKA orders   Inpatient Diabetes Program Recommendations:    Note patient has insulin pump. Upon removal site was kinked.  Agree with current orders.  Will follow.  Will see patient this afternoon and assist in making plan for transition back to insulin pump when appropriate.    Thanks,  Adah Perl, RN, BC-ADM Inpatient Diabetes Coordinator Pager [phone]  (8a-5p)  [phone]- attempted to see patient regarding insulin pump/DKA.  He is currently still very sleepy and somewhat confused.  Wife has gone home to shower.  Will follow up to see if I can get insulin pump settings.  At this point, it is safest for patient to stay on insulin drip until more aware or alert.

## 2022-08-16 NOTE — Progress Notes (Signed)
   08/16/22 0816  Spiritual Encounters  Type of Visit Follow up  Care provided to: Family  Reason for visit Routine spiritual support  OnCall Visit Yes   Chp went to follow up on pt. Pt. Still confused but offered support to the pt's wife.  Encouraged her that Pt was in good hands. Prayed for wife. Chaplains remain available by page.

## 2022-08-17 ENCOUNTER — Encounter (HOSPITAL_COMMUNITY): Payer: Self-pay | Admitting: Critical Care Medicine

## 2022-08-17 DIAGNOSIS — G9340 Encephalopathy, unspecified: Secondary | ICD-10-CM | POA: Diagnosis present

## 2022-08-17 DIAGNOSIS — N179 Acute kidney failure, unspecified: Secondary | ICD-10-CM

## 2022-08-17 DIAGNOSIS — G9341 Metabolic encephalopathy: Secondary | ICD-10-CM

## 2022-08-17 DIAGNOSIS — E1065 Type 1 diabetes mellitus with hyperglycemia: Secondary | ICD-10-CM | POA: Diagnosis not present

## 2022-08-17 LAB — CBC
HCT: 39.7 % (ref 39.0–52.0)
Hemoglobin: 14.4 g/dL (ref 13.0–17.0)
MCH: 31.4 pg (ref 26.0–34.0)
MCHC: 36.3 g/dL — ABNORMAL HIGH (ref 30.0–36.0)
MCV: 86.5 fL (ref 80.0–100.0)
Platelets: 207 10*3/uL (ref 150–400)
RBC: 4.59 MIL/uL (ref 4.22–5.81)
RDW: 12.7 % (ref 11.5–15.5)
WBC: 20.5 10*3/uL — ABNORMAL HIGH (ref 4.0–10.5)
nRBC: 0 % (ref 0.0–0.2)

## 2022-08-17 LAB — URINE CULTURE: Culture: <10000 — AB

## 2022-08-17 LAB — BASIC METABOLIC PANEL
Anion gap: 10 (ref 5–15)
Anion gap: 10 (ref 5–15)
BUN: 25 mg/dL — ABNORMAL HIGH (ref 6–20)
BUN: 22 mg/dL — ABNORMAL HIGH (ref 6–20)
CO2: 22 mmol/L (ref 22–32)
CO2: 24 mmol/L (ref 22–32)
Calcium: 9 mg/dL (ref 8.9–10.3)
Calcium: 9 mg/dL (ref 8.9–10.3)
Chloride: 103 mmol/L (ref 98–111)
Chloride: 103 mmol/L (ref 98–111)
Creatinine, Ser: 1.48 mg/dL — ABNORMAL HIGH (ref 0.61–1.24)
Creatinine, Ser: 1.19 mg/dL (ref 0.61–1.24)
GFR, Estimated: 55 mL/min — ABNORMAL LOW (ref >60)
GFR, Estimated: >60 mL/min (ref >60)
Glucose, Bld: 186 mg/dL — ABNORMAL HIGH (ref 70–99)
Glucose, Bld: 180 mg/dL — ABNORMAL HIGH (ref 70–99)
Potassium: 3.5 mmol/L (ref 3.5–5.1)
Potassium: 3.6 mmol/L (ref 3.5–5.1)
Sodium: 135 mmol/L (ref 135–145)
Sodium: 137 mmol/L (ref 135–145)

## 2022-08-17 LAB — GLUCOSE, CAPILLARY
Glucose-Capillary: 152 mg/dL — ABNORMAL HIGH (ref 70–99)
Glucose-Capillary: 165 mg/dL — ABNORMAL HIGH (ref 70–99)
Glucose-Capillary: 171 mg/dL — ABNORMAL HIGH (ref 70–99)
Glucose-Capillary: 172 mg/dL — ABNORMAL HIGH (ref 70–99)
Glucose-Capillary: 174 mg/dL — ABNORMAL HIGH (ref 70–99)
Glucose-Capillary: 178 mg/dL — ABNORMAL HIGH (ref 70–99)
Glucose-Capillary: 181 mg/dL — ABNORMAL HIGH (ref 70–99)
Glucose-Capillary: 203 mg/dL — ABNORMAL HIGH (ref 70–99)
Glucose-Capillary: 298 mg/dL — ABNORMAL HIGH (ref 70–99)
Glucose-Capillary: 327 mg/dL — ABNORMAL HIGH (ref 70–99)
Glucose-Capillary: 338 mg/dL — ABNORMAL HIGH (ref 70–99)

## 2022-08-17 LAB — AMMONIA: Ammonia: 26 umol/L (ref 9–35)

## 2022-08-17 LAB — LACTIC ACID, PLASMA: Lactic Acid, Venous: 2.8 mmol/L (ref 0.5–1.9)

## 2022-08-17 LAB — TROPONIN I (HIGH SENSITIVITY): Troponin I (High Sensitivity): 3965 ng/L (ref <18)

## 2022-08-17 LAB — LEVETIRACETAM LEVEL: Levetiracetam Lvl: 5.5 ug/mL — ABNORMAL LOW (ref 10.0–40.0)

## 2022-08-17 MED ORDER — POTASSIUM CHLORIDE CRYS ER 20 MEQ PO TBCR
40.0000 meq | EXTENDED_RELEASE_TABLET | Freq: Once | ORAL | Status: AC
  Administered 2022-08-17: 40 meq via ORAL
  Filled 2022-08-17: qty 2

## 2022-08-17 MED ORDER — INSULIN GLARGINE-YFGN 100 UNIT/ML ~~LOC~~ SOLN
40.0000 [IU] | SUBCUTANEOUS | Status: DC
  Administered 2022-08-17: 40 [IU] via SUBCUTANEOUS
  Filled 2022-08-17 (×2): qty 0.4

## 2022-08-17 MED ORDER — INSULIN ASPART 100 UNIT/ML IJ SOLN
0.0000 [IU] | INTRAMUSCULAR | Status: DC
  Administered 2022-08-17: 7 [IU] via SUBCUTANEOUS
  Administered 2022-08-17: 5 [IU] via SUBCUTANEOUS
  Administered 2022-08-17: 2 [IU] via SUBCUTANEOUS
  Administered 2022-08-18: 5 [IU] via SUBCUTANEOUS
  Administered 2022-08-18: 3 [IU] via SUBCUTANEOUS

## 2022-08-17 MED ORDER — LACTATED RINGERS IV SOLN: INTRAVENOUS | Status: DC

## 2022-08-17 NOTE — Progress Notes (Signed)
NAME:  Jeff [name], MRN:  [id number], DOB:  13-Sep-1965, LOS: 1 ADMISSION DATE:  08/16/2022, CONSULTATION DATE:  2/19 REFERRING MD:  Dr. Betsey Holiday, CHIEF COMPLAINT:  DKA   History of Present Illness:  Patient is a 57 year old male with pertinent PMH T1DM on insulin pump at home, CAD hx of previous CABG and stenting, HTN, HLD, seizure disorder presents to [facility] ED on 2/19 DKA.  Patient brought to [facility] ED on 2/19 with AMS.  Patient states about 2/18 patient not feeling well.  Found patient down in bathroom.  EMS called and transferred to [facility].  Upon arrival to [facility] ED, patient altered and slow to respond to questions.  CBG greater than 600.  Patient's insulin pump was removed at arrival and cannula was found to be kinked and not in skin.  BP soft 106/45.  Respirations upper 20s.  ABG 6.89, 20.5, 66, 3.9.  NA 125, K7.0, CO2 less than 7, glucose 1150, BUN 38, creat 3.02, AG non calculable.  Beta H greater than 8. DKA protocol initiated and started on insulin gtt per Endo tool.  IV fluids given. EKG with slurring QRS and peaked T waves.  Troponin greater than 3000.  Cardiology evaluated patient on arrival for syncopal episode but agreed no STEMI and will hold on PCI at this time.  Patient given calcium and bicarb for hyperkalemia. WBC 26.5 and afebrile.  LA greater than 9.  Cultures obtained and started on broad-spectrum antibiotics.  CXR no significant findings.  CT head/cervical spine no acute abnormality.  PCCM consulted for ICU admission.  Pertinent  Medical History   Past Medical History:  Diagnosis Date   Anxiety    CAD (coronary artery disease)    Cath 2011.  LAD stent patent, distal LAD occlusion, D1 100%, OM branch 80 - 90%, SVG to diag patent, LIMA to LAD occluded, SVG to RCA occluded, SVG to OM was occluded.   No change from previous cath.  Managed medically.    Cataract    Depression    Diabetes mellitus type I ([place])    on insulin pump at  home   Diabetic retinopathy    s/p vitrectomy and  history of retinal surgery   Hemorrhoid    History of oxygen administration    nocutural use only at 2 l/m nasally.   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  2/20 lots of Bms after getting lactulose. Transition off insulin gtt   Interim History / Subjective:  Got lactulose yesterday for ammonia of 78, has had numerous Bms since   Objective   Blood pressure 128/70, pulse 96, temperature 98.1 F (36.7 C), temperature source Oral, resp. rate (!) 22, height 5' 11"$  (1.803 m), weight 110.1 kg, SpO2 94 %.        Intake/Output Summary (Last 24 hours) at 08/17/2022 0939 Last data filed at 08/17/2022 0400 Gross per 24 hour  Intake 3921.28 ml  Output 2050 ml  Net 1871.28 ml   [employer]  08/16/22 0037 08/16/22 0400 08/17/22 0500  Weight: 108.5 kg 107 kg 110.1 kg    Examination: General:  ill appearing M NAD  HEENT: NCAT pink mm moist mm  Neuro: Lethargic, following commands  CV: rr s1s2 no rgm  PULM:  even unlabored  GI: soft ndnt  Extremities:  no acute joint deformity  Skin: Chronic lesions from neurofibromatosis    Resolved Hospital Problem list     Assessment & Plan:   Acute metabolic encephalopathy -due to below  P -supportive care  DKA DM1  -kinked insulin pump on arrival Plan: -transition off insulin gtt 2/20  -DM coord following   Leukocytosis C/f sepsis  -elevated WBC + a PCT of 2.19 -possible this is sepsis, but think more likely that this is reactive in setting of his severe DKA P -fine to cont empiric zosyn for  now but would have low threshold to de-escalate if next WBC, PCT are assuring or if micro data is unremarkable   AKI  Hypokalemia  -again think due to DKA -in d/w wife it sounds possible that he may have been grounded between toilet and wall, unclear duration  P -IVF per DKA mgmnt  -replace K  -minimize nephrotoxic agents  Type  2  MI -related to DKA -cards was engaged due to STEMI activation in field but cath deferred as picture c/w DKA as above  -trops down trending   Hyperammonemia Diarrhea, iatrogenic -got lactulose 2/19, numerous Bms since P -no further lactulose  -cont IVF   CAD  HTN HLD P -resume home statin and zetia -- parulent is q2wk injection, will defer this  -add back home imdur, metop, hold lisinopril  for now but expect likely add back 2/21   HX sz Neurofibromatosis type 1 Plan: -home keppra and tegretol  Depression Anxiety Plan: -hold home pristiq for now    Best Practice (right click and "Reselect all SmartList Selections" daily)   Diet/type: NPO w/ oral meds -- adv as tolerated  DVT prophylaxis: prophylactic heparin  GI prophylaxis: N/A Lines: N/A Foley:  N/A Code Status:  full code Last date of multidisciplinary goals of care discussion [2/20 updated patient and wife at bedside.]  Dispo: likely transfer out of ICU 2/20   Labs   CBC: Recent Labs  Lab 08/16/22 0035 08/16/22 0241 08/16/22 0455 08/16/22 0638 08/17/22 0244  WBC 26.5*  --  27.8* 23.4* 20.5*  NEUTROABS 21.7*  --   --   --   --   HGB 13.9  14.6 13.6 13.6 13.7 14.4  HCT 44.9  43.0 40.0 41.8 39.5 39.7  MCV 102.3*  --  96.5 91.4 86.5  PLT 397  --  302 235 [id number]    Basic Metabolic Panel: Recent Labs  Lab 08/16/22 0455 08/16/22 0638 08/16/22 1036 08/16/22 1433 08/16/22 1924 08/17/22 0244 08/17/22 0744  NA 129*   < > 130* 133* 135 135 137  K 5.6*   < > 4.0 3.5 3.4* 3.5 3.6  CL 92*   < > 96* 99 101 103 103  CO2 8*   < > 16* 20* 23 22 24  $ GLUCOSE 937*   < > 610*  420* 144* 186* 180*  BUN 39*   < > 36* 33* 29* 25* 22*  CREATININE 3.06*   < > 2.55* 2.09* 1.82* 1.48* 1.19  CALCIUM 9.5   < > 9.3 9.3 9.3 9.0 9.0  MG 2.6*  --   --   --   --   --   --    < > =  values in this interval not displayed.   GFR: Estimated Creatinine Clearance: 85.4 mL/min (by C-G formula based on SCr of 1.19 mg/dL). Recent Labs  Lab 08/16/22 0035 08/16/22 0049 08/16/22 0227 08/16/22 0455 08/16/22 0638 08/17/22 0244  PROCALCITON  --   --   --  2.19  --   --   WBC 26.5*  --   --  27.8* 23.4* 20.5*  LATICACIDVEN  --  >9.0* >9.0* 7.7*  --  2.8*    Liver Function Tests: Recent Labs  Lab 08/16/22 0035  AST 50*  ALT 35  ALKPHOS 89  BILITOT 0.9  PROT 5.7*  ALBUMIN 3.5   No results for input(s): "LIPASE", "AMYLASE" in the last 168 hours. Recent Labs  Lab 08/16/22 0455 08/17/22 0244  AMMONIA 78* 26    ABG    Component Value Date/Time   PHART 7.371 10/16/2009 2043   PCO2ART 39.7 10/16/2009 2043   PO2ART 92.0 10/16/2009 2043   HCO3 4.7 (L) 08/16/2022 0241   TCO2 5 (L) 08/16/2022 0241   ACIDBASEDEF 26.0 (H) 08/16/2022 0241   O2SAT 92 08/16/2022 0241     Coagulation Profile: No results for input(s): "INR", "PROTIME" in the last 168 hours.  Cardiac Enzymes: Recent Labs  Lab 08/16/22 1433  CKTOTAL 851*    HbA1C: Hgb A1c MFr Bld  Date/Time Value Ref Range Status  08/16/2022 04:55 AM 7.4 (H) 4.8 - 5.6 % Final    Comment:    (NOTE) Pre diabetes:          5.7%-6.4%  Diabetes:              >6.4%  Glycemic control for   <7.0% adults with diabetes   11/24/2011 10:24 PM 7.1 (H) <5.7 % Final    Comment:    (NOTE)                                                                       According to the ADA Clinical Practice Recommendations for 2011, when HbA1c is used as a screening test:  >=6.5%   Diagnostic of Diabetes Mellitus           (if abnormal result is confirmed) 5.7-6.4%   Increased risk of developing Diabetes Mellitus References:Diagnosis  and Classification of Diabetes Mellitus,Diabetes [id number] 1):S62-S69 and Standards of Medical Care in         Diabetes - 2011,Diabetes Care,2011,34 (Suppl 1):S11-S61.    CBG: Recent Labs  Lab 08/17/22 0217 08/17/22 0337 08/17/22 0502 08/17/22 0607 08/17/22 0922  GLUCAP 181* 172* 165* 171* 174*    CCT: n/a   Eliseo Gum MSN, AGACNP-BC [place] for pager  08/17/2022, 9:39 AM

## 2022-08-17 NOTE — Care Management (Signed)
.   Transition of Care [facility]) Screening Note   Patient Details  Name: Jeff [name] Date of Birth: 20-Aug-1965   Transition of Care [facility]) CM/SW Contact:    Bethena Roys, RN Phone Number: 08/17/2022, 12:39 PM    Transition of Care Department [facility]) has reviewed the patient and no TOC needs have been identified at this time. Patient presented for DKA-followed by Diabetes Coordinator and Critical Care. Case Manager will continue to monitor patient advancement through interdisciplinary progression rounds. If new patient transition needs arise, please place a TOC consult.

## 2022-08-18 DIAGNOSIS — N179 Acute kidney failure, unspecified: Secondary | ICD-10-CM | POA: Diagnosis not present

## 2022-08-18 DIAGNOSIS — G9340 Encephalopathy, unspecified: Secondary | ICD-10-CM | POA: Diagnosis not present

## 2022-08-18 DIAGNOSIS — G9341 Metabolic encephalopathy: Secondary | ICD-10-CM | POA: Diagnosis not present

## 2022-08-18 DIAGNOSIS — E1065 Type 1 diabetes mellitus with hyperglycemia: Secondary | ICD-10-CM | POA: Diagnosis not present

## 2022-08-18 LAB — GLUCOSE, CAPILLARY
Glucose-Capillary: 235 mg/dL — ABNORMAL HIGH (ref 70–99)
Glucose-Capillary: 267 mg/dL — ABNORMAL HIGH (ref 70–99)
Glucose-Capillary: 252 mg/dL — ABNORMAL HIGH (ref 70–99)
Glucose-Capillary: 259 mg/dL — ABNORMAL HIGH (ref 70–99)
Glucose-Capillary: 246 mg/dL — ABNORMAL HIGH (ref 70–99)
Glucose-Capillary: 285 mg/dL — ABNORMAL HIGH (ref 70–99)

## 2022-08-18 LAB — CBC
HCT: 37.3 % — ABNORMAL LOW (ref 39.0–52.0)
Hemoglobin: 12.9 g/dL — ABNORMAL LOW (ref 13.0–17.0)
MCH: 31.1 pg (ref 26.0–34.0)
MCHC: 34.6 g/dL (ref 30.0–36.0)
MCV: 89.9 fL (ref 80.0–100.0)
Platelets: 160 10*3/uL (ref 150–400)
RBC: 4.15 MIL/uL — ABNORMAL LOW (ref 4.22–5.81)
RDW: 13 % (ref 11.5–15.5)
WBC: 11.7 10*3/uL — ABNORMAL HIGH (ref 4.0–10.5)
nRBC: 0 % (ref 0.0–0.2)

## 2022-08-18 LAB — BASIC METABOLIC PANEL
Anion gap: 10 (ref 5–15)
BUN: 17 mg/dL (ref 6–20)
CO2: 25 mmol/L (ref 22–32)
Calcium: 8.5 mg/dL — ABNORMAL LOW (ref 8.9–10.3)
Chloride: 100 mmol/L (ref 98–111)
Creatinine, Ser: 0.92 mg/dL (ref 0.61–1.24)
GFR, Estimated: >60 mL/min (ref >60)
Glucose, Bld: 270 mg/dL — ABNORMAL HIGH (ref 70–99)
Potassium: 3.8 mmol/L (ref 3.5–5.1)
Sodium: 135 mmol/L (ref 135–145)

## 2022-08-18 MED ORDER — INSULIN ASPART 100 UNIT/ML IJ SOLN
0.0000 [IU] | Freq: Every day | INTRAMUSCULAR | Status: DC
  Administered 2022-08-18: 3 [IU] via SUBCUTANEOUS

## 2022-08-18 MED ORDER — DESVENLAFAXINE SUCCINATE ER 100 MG PO TB24
100.0000 mg | ORAL_TABLET | Freq: Every day | ORAL | Status: DC
  Administered 2022-08-18 – 2022-08-23 (×6): 100 mg via ORAL
  Filled 2022-08-18 (×6): qty 1

## 2022-08-18 MED ORDER — INSULIN GLARGINE-YFGN 100 UNIT/ML ~~LOC~~ SOLN
45.0000 [IU] | SUBCUTANEOUS | Status: DC
  Administered 2022-08-18: 45 [IU] via SUBCUTANEOUS
  Filled 2022-08-18 (×2): qty 0.45

## 2022-08-18 MED ORDER — INSULIN ASPART 100 UNIT/ML IJ SOLN
0.0000 [IU] | Freq: Three times a day (TID) | INTRAMUSCULAR | Status: DC
  Administered 2022-08-18: 5 [IU] via SUBCUTANEOUS
  Administered 2022-08-18: 3 [IU] via SUBCUTANEOUS
  Administered 2022-08-19: 5 [IU] via SUBCUTANEOUS
  Administered 2022-08-19: 7 [IU] via SUBCUTANEOUS
  Administered 2022-08-19 – 2022-08-20 (×3): 3 [IU] via SUBCUTANEOUS
  Administered 2022-08-21: 1 [IU] via SUBCUTANEOUS
  Administered 2022-08-21: 3 [IU] via SUBCUTANEOUS
  Administered 2022-08-22: 5 [IU] via SUBCUTANEOUS
  Administered 2022-08-22: 2 [IU] via SUBCUTANEOUS
  Administered 2022-08-23: 1 [IU] via SUBCUTANEOUS
  Administered 2022-08-23: 5 [IU] via SUBCUTANEOUS

## 2022-08-18 MED ORDER — LACTATED RINGERS IV SOLN: INTRAVENOUS | Status: DC

## 2022-08-18 MED ORDER — INSULIN ASPART 100 UNIT/ML IJ SOLN: 4.0000 [IU] | Freq: Three times a day (TID) | INTRAMUSCULAR | Status: DC

## 2022-08-18 NOTE — TOC Initial Note (Signed)
Transition of Care (TOC) - Initial/Assessment Note  Marvetta Gibbons RN, BSN Transitions of Care Unit 4E- RN Case Manager See Treatment Team for direct phone #   Patient Details  Name: Jeff [name] MRN: [id number] Date of Birth: 11-Mar-1966  Transition of Care [facility]) CM/SW Contact:    Dawayne Patricia, RN Phone Number: 08/18/2022, 2:02 PM  Clinical Narrative:                 Pt from home w/ wife- admitted w/ DKA 2/2 malfunction of insulin pump.  PT/OT evals pending- CM received request to speak with wife from DM coordinator to answer questions she has with regards to caring for pt at home.   CM spoke with wife at bedside- per conversation wife shared that pt is was independent PTA- did not use any assist devices. Discussed possible HH vs need for rehab- pending PT/OT evals- wife voiced that her mother is currently in [facility] SNF and she would not be interested in SNF for patient- she would be agreeable to [facility] services if needed- discussed [place] choice- they have used [facility] in the past and would like them if [facility] needed. Also discussed private duty assistance -which wife is familiar with as she used for her mother prior to her going to SNF. Wife is aware that she would need to arrange for private duty herself if she needs/wants additional help at home. Wife is worried at pt's current level of function she will have difficulty managing at home- as she does not have anyone else to assist her, and they live in a town home - although pt can stay on lower level.   Discussed possible DME needs- per wife she has rollator, transfer chair, cane, walker at home- may need BSC pending pt's progress with mobility.   CM will follow up with wife after PT/OT evals completed.   Expected Discharge Plan: Clever Barriers to Discharge: Continued Medical Work up   Patient Goals and CMS Choice Patient states their goals for this hospitalization and ongoing recovery are::  return home CMS Medicare.gov Compare Post Acute Care list provided to:: Patient Represenative (must comment) (wife) Choice offered to / list presented to : Spouse      Expected Discharge Plan and Services   Discharge Planning Services: CM Consult Post Acute Care Choice: Home Health, Durable Medical Equipment Living arrangements for the past 2 months:  (Townhome)                 DME Arranged: Bedside commode           HH Agency: [facility]        Prior Living Arrangements/Services Living arrangements for the past 2 months:  (Townhome) Lives with:: Spouse Patient language and need for interpreter reviewed:: Yes Do you feel safe going back to the place where you live?: Yes      Need for Family Participation in Patient Care: Yes (Comment) Care giver support system in place?: Yes (comment) Current home services: DME (rollator, cane, walker, transport chair) Criminal Activity/Legal Involvement Pertinent to Current Situation/Hospitalization: No - Comment as needed  Activities of Daily Living Home Assistive Devices/Equipment: None ADL Screening (condition at time of admission) Patient's cognitive ability adequate to safely complete daily activities?: Yes Is the patient deaf or have difficulty hearing?: No Does the patient have difficulty seeing, even when wearing glasses/contacts?: No Does the patient have difficulty concentrating, remembering, or making decisions?: No Patient able to express need  for assistance with ADLs?: Yes Does the patient have difficulty dressing or bathing?: No Independently performs ADLs?: Yes (appropriate for developmental age) Does the patient have difficulty walking or climbing stairs?: No Weakness of Legs: None Weakness of Arms/Hands: None  Permission Sought/Granted Permission sought to share information with : Facility Art therapist granted to share information with : Yes, Verbal Permission Granted     Permission  granted to share info w AGENCY: HH/DME        Emotional Assessment Appearance:: Appears stated age Attitude/Demeanor/Rapport: Lethargic Affect (typically observed): Unable to Assess Orientation: : Oriented to Self, Oriented to Place, Oriented to  Time, Oriented to Situation Alcohol / Substance Use: Not Applicable Psych Involvement: No (comment)  Admission diagnosis:  Hyperkalemia [E87.5] DKA (diabetic ketoacidosis) ([place]) [E11.10] Diabetic ketoacidosis without coma associated with type 1 diabetes mellitus ([place]) [E10.10] Patient Active Problem List   Diagnosis Date Noted   Encephalopathy acute 08/17/2022   Uncontrolled type 1 diabetes mellitus with hyperglycemia ([place]) 0000000   Acute metabolic encephalopathy 0000000   DKA (diabetic ketoacidosis) ([place]) 08/16/2022   AKI (acute kidney injury) ([place]) 08/16/2022   Hyperkalemia 08/16/2022   Special screening for malignant neoplasms, colon    Diverticulosis of colon without hemorrhage    Toe fracture, right    Sleep apnea    Seizure disorder (HCC)    Seasonal allergic rhinitis    Myocardial infarction ([place])    Hyperlipidemia    History of oxygen administration    Hemorrhoid    Diabetes mellitus type I ([place])    Depression    Cataract    CAD (coronary artery disease)    Anxiety    Odynophagia 11/28/2014   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 [place] [address] Phone: [phone] Fax: [place], [address] [place] [address] Phone: [phone] Fax: [phone]     Social Determinants of Health (SDOH) Social History: SDOH Screenings   Food Insecurity: No Food Insecurity (08/16/2022)  Housing: Low Risk  (08/16/2022)  Transportation Needs: No Transportation Needs (08/16/2022)  Utilities: Not At Risk (08/16/2022)  Tobacco Use: Low Risk  (08/17/2022)   SDOH Interventions:     Readmission Risk Interventions     No data to display

## 2022-08-18 NOTE — Inpatient Diabetes Management (Addendum)
Inpatient Diabetes Program Recommendations  AACE/ADA: New Consensus Statement on Inpatient Glycemic Control (2015)  Target Ranges:  Prepandial:   less than 140 mg/dL      Peak postprandial:   less than 180 mg/dL (1-2 hours)      Critically ill patients:  140 - 180 mg/dL   Lab Results  Component Value Date   GLUCAP 252 (H) 08/18/2022   HGBA1C 7.4 (H) 08/16/2022    Diabetes history: DM1 Outpatient Diabetes medications: Insulin Pump  Current orders for Inpatient glycemic control: Semglee 40 units qd, Novolog 0-9 units q 4 hrs.  Inpatient Diabetes Program Recommendations:   Please consider increase Semglee to 45 units qd starting this am. Spoke with wife and patient not ready to manage insulin pump yet so would continue injections today. He's eating small amounts mostly liquids so may want to add Novolog 4 units tid meal coverage if takes 50% meals, decrease Novolog correction to tid, 0-5 hs.   Patient attempted wearing a continuous glucose sensor in the past (unknown which brand) but patient states sensor wouldn't stay on his arm. Received order to give pt sample for discharge. Gave Libre 3 sensor to patient's wife to take to [place] [facility] [place] when discusses patient's return to using his insulin pump.  Educated spouse on insulin pen use at home. Reviewed contents of insulin flexpen starter kit. Reviewed all steps if insulin pen including attachment of needle, 2-unit air shot, dialing up dose, giving injection, removing needle, disposal of sharps, storage of unused insulin, disposal of insulin etc. Wife was able to provide successful return demonstration. Also reviewed troubleshooting with insulin pen. MD to give patient Rxs for insulin pens and insulin pen needles for while on pens.   Thank you, Nani Gasser. Donis Kotowski, RN, MSN, CDE  Diabetes Coordinator Inpatient Glycemic Control Team Team Pager [phone] (8am-5pm) 08/18/2022 9:35 AM

## 2022-08-18 NOTE — Progress Notes (Signed)
PROGRESS NOTE    Jeff [name]  [id number] DOB: 02/16/1966 DOA: 08/16/2022 PCP: Crist Infante, MD    Brief Narrative:  57 year old gentleman with history of type 1 diabetes on insulin pump at home, coronary artery disease with previous CABG and stenting, hypertension, hyperlipidemia and seizure disorder who presented to the emergency room on 2/19 with altered mental status.  He was not feeling well for the last 24 hours.  He was found down in the bathroom.  In the emergency room he was confused and slow to respond to questions.  Blood sugars were more than 600.  Insulin pump was removed on arrival and cannula was found to be kinked.  Due to significant findings he was admitted to intensive care unit and treated for DKA with insulin drip, IV fluids.  Troponins more than 3000.  WBC was 26,000, lactic acid was more than 9.   Assessment & Plan:   Acute metabolic encephalopathy secondary to DKA Diverticular acidosis in a patient with type 1 diabetes due to malfunctioning insulin pump  Medically stabilizing.  Mental status is improving slightly.  Still intermittent confusion however stabilizing.  Nonfocal. Delirium precautions.  Day and night reorientation.  Discontinue cardiac monitors and invasive lines.  Changed to MedSurg bed.  Family at the bedside. Treated with insulin drip and now transition to subcu insulin.  Will transition to 45 units of long-acting insulin and 4 units of prandial insulin with meals.  Advance diet today. Pump malfunction, patient has to follow-up at endocrine office.  Will discharge patient on subcu insulin until he has a follow-up. Electrolytes are adequate.  Severe sepsis present on admission, unknown primary source. Leukocytosis, severe lactic acidosis and acute kidney injury may be all related to DKA. Blood cultures no growth.  Urine culture less than 10,000 colonies no significant growth.  Procalcitonin 2.7.  Currently on IV Zosyn. Will complete 3 days of IV  Zosyn, if no further evidence of infection will discontinue.  Coronary artery disease  Hypertension  Hyperlipidemia  demand ischemia with troponin elevation in a patient with CAD: Troponins were greatly elevated but subsequently stabilized and trended down. Home medications including a statin and Zetia, Imdur, metoprolol resumed.  Lisinopril on hold.  Seizure disorder with neurofibromatosis type I, on Keppra and Tegretol.  Anxiety and depression: On Pristiq at home.  Resume today.  Advance to regular diet.  Mobilize with PT OT.  To prevent from delirium, discontinue rectal tube, condom catheter and cardiac telemetry. Anticipate home tomorrow if remains overall stable.   DVT prophylaxis: heparin injection 5,000 Units Start: 08/16/22 0600   Code Status: Full code Family Communication: Wife at the bedside Disposition Plan: Status is: Inpatient Remains inpatient appropriate because: Significant blood sugar elevation, confusion.     Consultants:  Critical care  Procedures:  None  Antimicrobials:  IV Zosyn 2/19---   Subjective: Patient seen in the morning rounds.  Wife was at the bedside.  Overnight events noted. Patient had multiple episodes of diarrhea yesterday.  By evening he was more confused.  Last night he pulled out his condom catheter and rectal tube.  Patient is able to keep up conversation but slow to respond.  Remains afebrile.  Denies any nausea vomiting.  Does not have any appetite but wants to try some real food.  Objective: Vitals:   08/17/22 1900 08/17/22 2011 08/17/22 2300 08/18/22 0809  BP:  (!) 159/75 123/68 (!) 155/84  Pulse:  (!) 101 89 89  Resp:  18 18 18  $ Temp:  98.1 F (36.7 C) 98.1 F (36.7 C) 98 F (36.7 C) 98.2 F (36.8 C)  TempSrc: Oral Oral Oral Oral  SpO2:  92% 91% 95%  Weight:      Height:        Intake/Output Summary (Last 24 hours) at 08/18/2022 1017 Last data filed at 08/18/2022 0500 Gross per 24 hour  Intake 732.78 ml  Output  1825 ml  Net -1092.22 ml   Filed Weights   08/16/22 0037 08/16/22 0400 08/17/22 0500  Weight: 108.5 kg 107 kg 110.1 kg    Examination:  General exam: Appears calm and comfortable  Alert and awake on stimulation.  He is oriented x 2-3.  Still appears with flat affect and lethargic. Respiratory system: Clear to auscultation. Respiratory effort normal. Cardiovascular system: S1 & S2 heard, RRR. No JVD, murmurs, rubs, gallops or clicks. No pedal edema. Gastrointestinal system: Abdomen is nondistended, soft and nontender. No organomegaly or masses felt. Normal bowel sounds heard. Central nervous system: No focal neurological deficits. Extremities: Symmetric 5 x 5 power.   Data Reviewed: I have personally reviewed following labs and imaging studies  CBC: Recent Labs  Lab 08/16/22 0035 08/16/22 0241 08/16/22 0455 08/16/22 0638 08/17/22 0244 08/18/22 0215  WBC 26.5*  --  27.8* 23.4* 20.5* 11.7*  NEUTROABS 21.7*  --   --   --   --   --   HGB 13.9  14.6 13.6 13.6 13.7 14.4 12.9*  HCT 44.9  43.0 40.0 41.8 39.5 39.7 37.3*  MCV 102.3*  --  96.5 91.4 86.5 89.9  PLT 397  --  302 235 207 0000000   Basic Metabolic Panel: Recent Labs  Lab 08/16/22 0455 08/16/22 0638 08/16/22 1433 08/16/22 1924 08/17/22 0244 08/17/22 0744 08/18/22 0215  NA 129*   < > 133* 135 135 137 135  K 5.6*   < > 3.5 3.4* 3.5 3.6 3.8  CL 92*   < > 99 101 103 103 100  CO2 8*   < > 20* 23 22 24 25  $ GLUCOSE 937*   < > 420* 144* 186* 180* 270*  BUN 39*   < > 33* 29* 25* 22* 17  CREATININE 3.06*   < > 2.09* 1.82* 1.48* 1.19 0.92  CALCIUM 9.5   < > 9.3 9.3 9.0 9.0 8.5*  MG 2.6*  --   --   --   --   --   --    < > = values in this interval not displayed.   GFR: Estimated Creatinine Clearance: 110.4 mL/min (by C-G formula based on SCr of 0.92 mg/dL). Liver Function Tests: Recent Labs  Lab 08/16/22 0035  AST 50*  ALT 35  ALKPHOS 89  BILITOT 0.9  PROT 5.7*  ALBUMIN 3.5   No results for input(s): "LIPASE",  "AMYLASE" in the last 168 hours. Recent Labs  Lab 08/16/22 0455 08/17/22 0244  AMMONIA 78* 26   Coagulation Profile: No results for input(s): "INR", "PROTIME" in the last 168 hours. Cardiac Enzymes: Recent Labs  Lab 08/16/22 1433  CKTOTAL 851*   BNP (last 3 results) No results for input(s): "PROBNP" in the last 8760 hours. HbA1C: Recent Labs    08/16/22 0455  HGBA1C 7.4*   CBG: Recent Labs  Lab 08/17/22 2025 08/17/22 2125 08/18/22 0146 08/18/22 0513 08/18/22 0811  GLUCAP 338* 327* 235* 267* 252*   Lipid Profile: No results for input(s): "CHOL", "HDL", "LDLCALC", "TRIG", "CHOLHDL", "LDLDIRECT" in the last 72 hours. Thyroid Function Tests: Recent  Labs    08/16/22 0455  TSH 3.749   Anemia Panel: No results for input(s): "VITAMINB12", "FOLATE", "FERRITIN", "TIBC", "IRON", "RETICCTPCT" in the last 72 hours. Sepsis Labs: Recent Labs  Lab 08/16/22 0049 08/16/22 0227 08/16/22 0455 08/17/22 0244  PROCALCITON  --   --  2.19  --   LATICACIDVEN >9.0* >9.0* 7.7* 2.8*    Recent Results (from the past 240 hour(s))  Culture, blood (single)     Status: None (Preliminary result)   Collection Time: 08/16/22  2:27 AM   Specimen: BLOOD  Result Value Ref Range Status   Specimen Description BLOOD LEFT ANTECUBITAL  Final   Special Requests   Final    BOTTLES DRAWN AEROBIC AND ANAEROBIC Blood Culture adequate volume   Culture   Final    NO GROWTH 2 DAYS Performed at [facility], [place] [address] [place], [place] [address]    Report Status PENDING  Incomplete  MRSA Next Gen by PCR, Nasal     Status: None   Collection Time: 08/16/22  4:02 AM   Specimen: Nasal Mucosa; Nasal Swab  Result Value Ref Range Status   MRSA by PCR Next Gen NOT DETECTED NOT DETECTED Final    Comment: (NOTE) The GeneXpert MRSA Assay (FDA approved for NASAL specimens only), is one component of a comprehensive MRSA colonization surveillance program. It is not intended to diagnose MRSA  infection nor to guide or monitor treatment for MRSA infections. Test performance is not FDA approved in patients less than 33 years old. Performed at [facility], [place] [address] [place], [place] [address]   Culture, blood (Routine X 2) w Reflex to ID Panel     Status: None (Preliminary result)   Collection Time: 08/16/22  4:55 AM   Specimen: BLOOD  Result Value Ref Range Status   Specimen Description BLOOD BLOOD LEFT HAND  Final   Special Requests   Final    BOTTLES DRAWN AEROBIC AND ANAEROBIC Blood Culture adequate volume   Culture   Final    NO GROWTH 2 DAYS Performed at [facility], [place] [address] [place], [place] [address]    Report Status PENDING  Incomplete  Urine Culture (for pregnant, neutropenic or urologic patients or patients with an indwelling urinary catheter)     Status: Abnormal   Collection Time: 08/16/22  5:58 AM   Specimen: Urine, Clean Catch  Result Value Ref Range Status   Specimen Description URINE, CLEAN CATCH  Final   Special Requests NONE  Final   Culture (A)  Final    <10,000 COLONIES/mL INSIGNIFICANT GROWTH Performed at [place] [address] [place], [place] [address]    Report Status 08/17/2022 FINAL  Final         Radiology Studies: No results found.      Scheduled Meds:  aspirin  81 mg Oral Daily   carbamazepine  600 mg Oral BID   Chlorhexidine Gluconate Cloth  6 each Topical Daily   desvenlafaxine  100 mg Oral Daily   ezetimibe  10 mg Oral QHS   heparin  5,000 Units Subcutaneous Q8H   insulin aspart  0-5 Units Subcutaneous QHS   insulin aspart  0-9 Units Subcutaneous TID WC   insulin aspart  4 Units Subcutaneous TID WC   insulin glargine-yfgn  45 Units Subcutaneous Q24H   isosorbide mononitrate  60 mg Oral Daily   levETIRAcetam  750 mg Oral BID   metoprolol tartrate  25 mg Oral BID   pneumococcal 23 valent vaccine  0.5 mL Intramuscular Tomorrow-1000   rosuvastatin  40 mg Oral QHS   tamsulosin  0.4  mg Oral Daily   Continuous Infusions:  piperacillin-tazobactam (ZOSYN)  IV 3.375 g (08/18/22 0205)   promethazine (PHENERGAN) injection (IM or IVPB)       LOS: 2 days    Time spent: 35 minutes    Barb Merino, MD [facility] Pager [phone]

## 2022-08-18 NOTE — Care Management Important Message (Signed)
Important Message  Patient Details  Name: Jeff [name] MRN: [id number] Date of Birth: 1966-05-29   Medicare Important Message Given:  Yes     Shelda Altes 08/18/2022, 10:25 AM

## 2022-08-19 ENCOUNTER — Inpatient Hospital Stay (HOSPITAL_COMMUNITY): Payer: Medicare PPO

## 2022-08-19 DIAGNOSIS — G9341 Metabolic encephalopathy: Secondary | ICD-10-CM | POA: Diagnosis not present

## 2022-08-19 DIAGNOSIS — N179 Acute kidney failure, unspecified: Secondary | ICD-10-CM | POA: Diagnosis not present

## 2022-08-19 DIAGNOSIS — E1065 Type 1 diabetes mellitus with hyperglycemia: Secondary | ICD-10-CM | POA: Diagnosis not present

## 2022-08-19 DIAGNOSIS — G9340 Encephalopathy, unspecified: Secondary | ICD-10-CM | POA: Diagnosis not present

## 2022-08-19 LAB — BASIC METABOLIC PANEL
Anion gap: 8 (ref 5–15)
BUN: 13 mg/dL (ref 6–20)
CO2: 25 mmol/L (ref 22–32)
Calcium: 8 mg/dL — ABNORMAL LOW (ref 8.9–10.3)
Chloride: 101 mmol/L (ref 98–111)
Creatinine, Ser: 0.81 mg/dL (ref 0.61–1.24)
GFR, Estimated: >60 mL/min (ref >60)
Glucose, Bld: 267 mg/dL — ABNORMAL HIGH (ref 70–99)
Potassium: 3.5 mmol/L (ref 3.5–5.1)
Sodium: 134 mmol/L — ABNORMAL LOW (ref 135–145)

## 2022-08-19 LAB — GLUCOSE, CAPILLARY
Glucose-Capillary: 264 mg/dL — ABNORMAL HIGH (ref 70–99)
Glucose-Capillary: 317 mg/dL — ABNORMAL HIGH (ref 70–99)
Glucose-Capillary: 228 mg/dL — ABNORMAL HIGH (ref 70–99)
Glucose-Capillary: 140 mg/dL — ABNORMAL HIGH (ref 70–99)

## 2022-08-19 MED ORDER — INSULIN GLARGINE-YFGN 100 UNIT/ML ~~LOC~~ SOLN
50.0000 [IU] | SUBCUTANEOUS | Status: DC
  Administered 2022-08-19 – 2022-08-23 (×5): 50 [IU] via SUBCUTANEOUS
  Filled 2022-08-19 (×5): qty 0.5

## 2022-08-19 MED ORDER — LOPERAMIDE HCL 2 MG PO CAPS
2.0000 mg | ORAL_CAPSULE | ORAL | Status: DC | PRN
  Administered 2022-08-19 – 2022-08-21 (×3): 2 mg via ORAL
  Filled 2022-08-19 (×4): qty 1

## 2022-08-19 MED ORDER — INSULIN ASPART 100 UNIT/ML IJ SOLN
6.0000 [IU] | Freq: Three times a day (TID) | INTRAMUSCULAR | Status: DC
  Administered 2022-08-19 – 2022-08-23 (×12): 6 [IU] via SUBCUTANEOUS

## 2022-08-19 NOTE — Evaluation (Signed)
Physical Therapy Evaluation Patient Details Name: ALEXANDRU SARDUY MRN: [id number] DOB: 12-05-1965 Today's Date: 08/19/2022  History of Present Illness  57 yo male admitted 2/19 after syncope and AMS at home with DKA due to insulin pump kinked. Elevated troponin without STEMI. PMhx: CABG, HTN, HLD, T2DM, seizure disorder  Clinical Impression  Pt pleasantly confused and reports normally being independent and alone during the day while wife works. Pt currently limited by AMS, impaired balance, safety and function who will benefit from acute therapy to maximize mobility, safety and independence. Wife reports ST-SNF if not an option and she will stay home with pt and have him stay downstairs. Encouraged OOB and mobility with staff acutely.      Recommendations for follow up therapy are one component of a multi-disciplinary discharge planning process, led by the attending physician.  Recommendations may be updated based on patient status, additional functional criteria and insurance authorization.  Follow Up Recommendations Home health PT      Assistance Recommended at Discharge Frequent or constant Supervision/Assistance  Patient can return home with the following  A little help with walking and/or transfers;A little help with bathing/dressing/bathroom;Assistance with cooking/housework;Direct supervision/assist for medications management;Assist for transportation;Direct supervision/assist for financial management;Help with stairs or ramp for entrance    Equipment Recommendations Rolling walker (2 wheels);BSC/3in1  Recommendations for Other Services       Functional Status Assessment Patient has had a recent decline in their functional status and demonstrates the ability to make significant improvements in function in a reasonable and predictable amount of time.     Precautions / Restrictions Precautions Precautions: Fall Precaution Comments: rectal pouch Restrictions      Mobility   Bed Mobility Overal bed mobility: Needs Assistance Bed Mobility: Supine to Sit     Supine to sit: Min guard     General bed mobility comments: HOB 20 degrees with increased time and cues for safety with assist for lines    Transfers Overall transfer level: Needs assistance   Transfers: Sit to/from Stand Sit to Stand: Min guard           General transfer comment: cues for hand placement and safety    Ambulation/Gait Ambulation/Gait assistance: Min assist Gait Distance (Feet): 150 Feet Assistive device: Rolling walker (2 wheels) Gait Pattern/deviations: Step-through pattern, Decreased stride length, Drifts right/left   Gait velocity interpretation: 1.31 - 2.62 ft/sec, indicative of limited community ambulator   General Gait Details: pt veering right/left, unaware of obstacles despite cues, min assist for balance and direction with cues for proximity to [employer]    Modified Rankin (Stroke Patients Only)       Balance Overall balance assessment: Needs assistance Sitting-balance support: No upper extremity supported, Feet supported Sitting balance-Leahy Scale: Fair     Standing balance support: Bilateral upper extremity supported, Reliant on assistive device for balance Standing balance-Leahy Scale: Poor                               Pertinent Vitals/Pain Pain Assessment Pain Assessment: No/denies pain    Home Living Family/patient expects to be discharged to:: Private residence Living Arrangements: Spouse/significant other Available Help at Discharge: Family;Available PRN/intermittently Type of Home: House Home Access: Stairs to enter   Entrance Stairs-Number of Steps: 3   Home Layout: Two level;Bed/bath upstairs Home Equipment: Rolling Walker (2 wheels) Additional Comments: pt  on disability, does not drive, wife works part time    Prior Function 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        Extremity/Trunk Assessment   Upper Extremity Assessment Upper Extremity Assessment: Generalized weakness    Lower Extremity Assessment Lower Extremity Assessment: Generalized weakness    Cervical / Trunk Assessment Cervical / Trunk Assessment: Other exceptions Cervical / Trunk Exceptions: rounded shoulders  Communication   Communication: No difficulties  Cognition Arousal/Alertness: Awake/alert Behavior During Therapy: Flat affect Overall Cognitive Status: Impaired/Different from baseline Area of Impairment: Orientation, Memory, Safety/judgement, Problem solving                 Orientation Level: Disoriented to, Time, Situation   Memory: Decreased short-term memory   Safety/Judgement: Decreased awareness of safety, Decreased awareness of deficits   Problem Solving: Slow processing General Comments: pt in bed on arrival thinking he is in the middle of a test, not oriented to day, decreased awareness and problem solving throughout, unable to recall room number or location and trying to enter other pt's room to urinate        General Comments      Exercises     Assessment/Plan    PT Assessment Patient needs continued PT services  PT Problem List Decreased strength;Decreased mobility;Decreased safety awareness;Decreased activity tolerance;Decreased cognition;Decreased balance;Decreased knowledge of use of DME       PT Treatment Interventions Gait training;Therapeutic exercise;Patient/family education;Stair training;Functional mobility training;Therapeutic activities;DME instruction;Balance training;Cognitive remediation    PT Goals (Current goals can be found in the Care Plan section)  Acute Rehab PT Goals Patient Stated Goal: return home, watch [employer] football PT Goal Formulation: With patient/family Time For Goal Achievement: 09/02/22 Potential to Achieve Goals: Fair    Frequency Min  3X/week     Co-evaluation               AM-PAC PT "6 Clicks" Mobility  Outcome Measure Help needed turning from your back to your side while in a flat bed without using bedrails?: A Little Help needed moving from lying on your back to sitting on the side of a flat bed without using bedrails?: A Little Help needed moving to and from a bed to a chair (including a wheelchair)?: A Little Help needed standing up from a chair using your arms (e.g., wheelchair or bedside chair)?: A Little Help needed to walk in hospital room?: A Little Help needed climbing 3-5 steps with a railing? : A Lot 6 Click Score: 17    End of Session   Activity Tolerance: Patient tolerated treatment well Patient left: in chair;with call bell/phone within reach;with chair alarm set;with family/visitor present Nurse Communication: Mobility status PT Visit Diagnosis: Other abnormalities of gait and mobility (R26.89);Muscle weakness (generalized) (M62.81)    Time: [id number] PT Time Calculation (min) (ACUTE ONLY): 21 min   Charges:   PT Evaluation $PT Eval Moderate Complexity: 1 Mod          Genese Quebedeaux P, PT Acute Rehabilitation Services Office: [phone]   Lamarr Lulas 08/19/2022, 10:28 AM

## 2022-08-19 NOTE — Evaluation (Addendum)
Occupational Therapy Evaluation Patient Details Name: Jeff [name] MRN: [id number] DOB: April 03, 1966 Today's Date: 08/19/2022   History of Present Illness 57 yo male admitted 2/19 after syncope and AMS at home with DKA due to insulin pump kinked. Elevated troponin without STEMI. PMhx: CABG, HTN, HLD, T2DM, seizure disorder   Clinical Impression   Pt was functioning independent prior to admission, on disability and does not drive. He presents with impaired cognition, generalized weakness and impaired standing balance. He needs min to mod assist for ADLs and min assist with RW for OOB. Wife at bedside and will be with pt 24 hours when he returns home. Recommend HHOT.      Recommendations for follow up therapy are one component of a multi-disciplinary discharge planning process, led by the attending physician.  Recommendations may be updated based on patient status, additional functional criteria and insurance authorization.   Follow Up Recommendations  Home health OT     Assistance Recommended at Discharge Frequent or constant Supervision/Assistance  Patient can return home with the following A little help with walking and/or transfers;A lot of help with bathing/dressing/bathroom;Assistance with cooking/housework;Assistance with feeding;Direct supervision/assist for medications management;Direct supervision/assist for financial management;Assist for transportation;Help with stairs or ramp for entrance    Functional Status Assessment  Patient has had a recent decline in their functional status and demonstrates the ability to make significant improvements in function in a reasonable and predictable amount of time.  Equipment Recommendations  BSC  Recommendations for Other Services       Precautions / Restrictions Precautions Precautions: Fall Precaution Comments: rectal pouch Restrictions Weight Bearing Restrictions: No      Mobility Bed Mobility               General bed  mobility comments: received in chair    Transfers Overall transfer level: Needs assistance Equipment used: Rolling walker (2 wheels) Transfers: Sit to/from Stand Sit to Stand: Min guard           General transfer comment: cues for hand placement and safety      Balance Overall balance assessment: Needs assistance Sitting-balance support: No upper extremity supported, Feet supported Sitting balance-Leahy Scale: Fair     Standing balance support: Bilateral upper extremity supported, Reliant on assistive device for balance Standing balance-Leahy Scale: Poor                             ADL either performed or assessed with clinical judgement   ADL Overall ADL's : Needs assistance/impaired Eating/Feeding: Minimal assistance;Sitting Eating/Feeding Details (indicate cue type and reason): assist to set up tray Grooming: Minimal assistance;Sitting   Upper Body Bathing: Minimal assistance;Sitting   Lower Body Bathing: Moderate assistance;Sit to/from stand   Upper Body Dressing : Minimal assistance;Sitting   Lower Body Dressing: Moderate assistance;Sit to/from stand Lower Body Dressing Details (indicate cue type and reason): cannot perform figure 4, but able to cross foot near opposite knee Toilet Transfer: Minimal assistance;Ambulation;Rolling walker (2 wheels)           Functional mobility during ADLs: Minimal assistance;Rolling walker (2 wheels)       Vision Baseline Vision/History: 1 Wears glasses Ability to See in Adequate Light: 0 Adequate Patient Visual Report: No change from baseline Additional Comments: pt with long hx of multiple eye diseases and surgeries     Perception     Praxis      Pertinent Vitals/Pain Pain Assessment Pain Assessment: No/denies pain  Hand Dominance Left   Extremity/Trunk Assessment Upper Extremity Assessment Upper Extremity Assessment: Overall WFL for tasks assessed   Lower Extremity Assessment Lower  Extremity Assessment: Defer to PT evaluation   Cervical / Trunk Assessment Cervical / Trunk Assessment: Other exceptions Cervical / Trunk Exceptions: rounded shoulders   Communication Communication Communication: No difficulties   Cognition Arousal/Alertness: Awake/alert Behavior During Therapy: Flat affect Overall Cognitive Status: Impaired/Different from baseline Area of Impairment: Memory, Problem solving, Safety/judgement                     Memory: Decreased short-term memory   Safety/Judgement: Decreased awareness of safety, Decreased awareness of deficits   Problem Solving: Slow processing, Requires verbal cues General Comments: pt needing help to find eating utensils, problem solve opening containers     General Comments       Exercises     Shoulder Instructions      Home Living Family/patient expects to be discharged to:: Private residence Living Arrangements: Spouse/significant other Available Help at Discharge: Family;Available PRN/intermittently Type of Home: House Home Access: Stairs to enter [employer] of Steps: 3   Home Layout: Two level;Bed/bath upstairs     Bathroom Shower/Tub: Tub/shower unit   Bathroom Toilet: Handicapped height     Home Equipment: Conservation officer, nature (2 wheels)   Additional Comments: pt on disability, does not drive, wife works part time      Prior Functioning/Environment Prior Level of Function : Independent/Modified Independent                        OT Problem List: Decreased strength;Impaired balance (sitting and/or standing);Decreased cognition;Decreased safety awareness;Decreased knowledge of use of DME or AE      OT Treatment/Interventions: Self-care/ADL training;DME and/or AE instruction;Patient/family education;Balance training;Therapeutic activities    OT Goals(Current goals can be found in the care plan section) Acute Rehab OT Goals OT Goal Formulation: With patient Time For Goal  Achievement: 09/02/22 Potential to Achieve Goals: Good ADL Goals Pt Will Perform Grooming: with supervision;standing Pt Will Perform Lower Body Bathing: with min assist;sit to/from stand Pt Will Perform Lower Body Dressing: with min assist;sit to/from stand Pt Will Transfer to Toilet: with supervision;ambulating;regular height toilet Pt Will Perform Toileting - Clothing Manipulation and hygiene: with supervision;sit to/from stand Additional ADL Goal #1: Pt will identify and gather items necessary for ADLs around his room with min verbal cues.  OT Frequency: Min 2X/week    Co-evaluation              AM-PAC OT "6 Clicks" Daily Activity     Outcome Measure Help from another person eating meals?: A Little Help from another person taking care of personal grooming?: A Little Help from another person toileting, which includes using toliet, bedpan, or urinal?: A Lot Help from another person bathing (including washing, rinsing, drying)?: A Lot Help from another person to put on and taking off regular upper body clothing?: A Little Help from another person to put on and taking off regular lower body clothing?: A Lot 6 Click Score: 15   End of Session Equipment Utilized During Treatment: Rolling walker (2 wheels)  Activity Tolerance: Patient tolerated treatment well Patient left: in chair;with call bell/phone within reach;with chair alarm set;with family/visitor present  OT Visit Diagnosis: Unsteadiness on feet (R26.81);Other abnormalities of gait and mobility (R26.89);Other symptoms and signs involving cognitive function;Muscle weakness (generalized) (M62.81)  Time: [id number] OT Time Calculation (min): 25 min Charges:  OT General Charges $OT Visit: 1 Visit OT Evaluation $OT Eval Moderate Complexity: 1 Mod OT Treatments $Self Care/Home Management : 8-22 mins  Cleta Alberts, OTR/L Acute Rehabilitation Services Office: [phone]   Malka So 08/19/2022, 11:46  AM

## 2022-08-19 NOTE — Progress Notes (Signed)
Mobility Specialist - Progress Note   08/19/22 1300  Mobility  Activity Ambulated with assistance in hallway  Level of Assistance Minimal assist, patient does 75% or more  Assistive Device Front wheel walker  Distance Ambulated (ft) 450 ft  Activity Response Tolerated well  Mobility Referral Yes  $Mobility charge 1 Mobility    Pt received in recliner agreeable to mobility. No complaints throughout, tolerated increased distance well. MinA to help remain steady throughout d/t slight drifting/stumbling. Left in recliner w/ chair alarm on and call bell in his lap.   [place] Specialist Please contact via [employer] or Rehab office at [phone]

## 2022-08-19 NOTE — Progress Notes (Signed)
PROGRESS NOTE    WARDEN Jeff [name]  [id number] DOB: 02-08-1966 DOA: 08/16/2022 PCP: Crist Infante, MD    Brief Narrative:  57 year old gentleman with history of type 1 diabetes on insulin pump at home, coronary artery disease with previous CABG and stenting, hypertension, hyperlipidemia and seizure disorder who presented to the emergency room on 2/19 with altered mental status.  He was not feeling well for the last 24 hours.  He was found down in the bathroom.  In the emergency room he was confused and slow to respond to questions.  Blood sugars were more than 600.  Insulin pump was removed on arrival and cannula was found to be kinked.  Due to significant findings he was admitted to intensive care unit and treated for DKA with insulin drip, IV fluids.  Troponins more than 3000.  WBC was 26,000, lactic acid was more than 9.   Assessment & Plan:   Acute metabolic encephalopathy secondary to DKA Diabetic ketoacidosis in a patient with type 1 diabetes due to malfunctioning insulin pump.  Medically stabilizing. Still intermittent confusion however stabilizing.  Nonfocal on exam. Delirium precautions.  Day and night reorientation.   Treated with insulin drip and now transition to subcu insulin.  Increase Levemir to 50 units today, prandial insulin to 6 units 3 times daily with meals.  Continue sliding scale insulin.   Pump malfunction, patient has to follow-up at endocrine office.  Will discharge patient on subcu insulin until he has a follow-up. Electrolytes are adequate.  Severe sepsis present on admission, unknown primary source. Leukocytosis, severe lactic acidosis and acute kidney injury may be all related to DKA. Blood cultures no growth.  Urine culture less than 10,000 colonies no significant growth.  Procalcitonin 2.7.  Currently on IV Zosyn.  Completed 3 days of IV Zosyn and discontinued.  Coronary artery disease  Hypertension  Hyperlipidemia  demand ischemia with troponin elevation  in a patient with CAD: Troponins were greatly elevated but subsequently stabilized and trended down. Home medications including a statin and Zetia, Imdur, metoprolol resumed.  Lisinopril on hold.  Seizure disorder with neurofibromatosis type I, on Keppra and Tegretol.  Stable.  Anxiety and depression: On Pristiq at home.  Resumed.   Diarrhea: Probably iatrogenic with large dose of lactulose.  Still has significant diarrhea.  Use Imodium.  Continue IV fluids due to significant blood loss.  Continue to mobilize with PT OT.  Anticipate home next 24 to 48 hours if mental status improves and safe mobility.  DVT prophylaxis: heparin injection 5,000 Units Start: 08/16/22 0600   Code Status: Full code Family Communication: Wife at the bedside Disposition Plan: Status is: Inpatient Remains inpatient appropriate because: Significant blood sugar elevation, confusion.  Ongoing diarrhea.  IV fluids.     Consultants:  Critical care  Procedures:  None  Antimicrobials:  IV Zosyn 2/19--- 2/21   Subjective:  Patient seen and examined.  Today he is up in the chair.  He is able to have fair conversation.  Also full of sense of humor.  Overnight wife reported he was impulsive and did not know where he was.  Continues to have loose watery stool collected in rectal pouch.  Talkative today.  Walked in the hallway with walker and minimal assist.  Objective: Vitals:   08/19/22 0100 08/19/22 0200 08/19/22 0500 08/19/22 0801  BP:    (!) 157/88  Pulse: 85 83  90  Resp:    17  Temp:    98.1 F (36.7 C)  TempSrc:    Oral  SpO2: 95% 93%  98%  Weight:   110.4 kg   Height:        Intake/Output Summary (Last 24 hours) at 08/19/2022 1003 Last data filed at 08/19/2022 0700 Gross per 24 hour  Intake 1848.12 ml  Output 1700 ml  Net 148.12 ml   Filed Weights   08/16/22 0400 08/17/22 0500 08/19/22 0500  Weight: 107 kg 110.1 kg 110.4 kg    Examination:  General exam: Appears calm and comfortable.   Talkative.  Mostly oriented. Respiratory system: Clear to auscultation. Respiratory effort normal. Cardiovascular system: S1 & S2 heard, RRR. No JVD, murmurs, rubs, gallops or clicks. No pedal edema. Gastrointestinal system: Abdomen is nondistended, soft and nontender. No organomegaly or masses felt. Normal bowel sounds heard. Central nervous system: No focal neurological deficits. Extremities: Symmetric 5 x 5 power.   Data Reviewed: I have personally reviewed following labs and imaging studies  CBC: Recent Labs  Lab 08/16/22 0035 08/16/22 0241 08/16/22 0455 08/16/22 0638 08/17/22 0244 08/18/22 0215  WBC 26.5*  --  27.8* 23.4* 20.5* 11.7*  NEUTROABS 21.7*  --   --   --   --   --   HGB 13.9  14.6 13.6 13.6 13.7 14.4 12.9*  HCT 44.9  43.0 40.0 41.8 39.5 39.7 37.3*  MCV 102.3*  --  96.5 91.4 86.5 89.9  PLT 397  --  302 235 207 0000000   Basic Metabolic Panel: Recent Labs  Lab 08/16/22 0455 08/16/22 0638 08/16/22 1924 08/17/22 0244 08/17/22 0744 08/18/22 0215 08/19/22 0128  NA 129*   < > 135 135 137 135 134*  K 5.6*   < > 3.4* 3.5 3.6 3.8 3.5  CL 92*   < > 101 103 103 100 101  CO2 8*   < > 23 22 24 25 25  $ GLUCOSE 937*   < > 144* 186* 180* 270* 267*  BUN 39*   < > 29* 25* 22* 17 13  CREATININE 3.06*   < > 1.82* 1.48* 1.19 0.92 0.81  CALCIUM 9.5   < > 9.3 9.0 9.0 8.5* 8.0*  MG 2.6*  --   --   --   --   --   --    < > = values in this interval not displayed.   GFR: Estimated Creatinine Clearance: 125.6 mL/min (by C-G formula based on SCr of 0.81 mg/dL). Liver Function Tests: Recent Labs  Lab 08/16/22 0035  AST 50*  ALT 35  ALKPHOS 89  BILITOT 0.9  PROT 5.7*  ALBUMIN 3.5   No results for input(s): "LIPASE", "AMYLASE" in the last 168 hours. Recent Labs  Lab 08/16/22 0455 08/17/22 0244  AMMONIA 78* 26   Coagulation Profile: No results for input(s): "INR", "PROTIME" in the last 168 hours. Cardiac Enzymes: Recent Labs  Lab 08/16/22 1433  CKTOTAL 851*    BNP (last 3 results) No results for input(s): "PROBNP" in the last 8760 hours. HbA1C: No results for input(s): "HGBA1C" in the last 72 hours.  CBG: Recent Labs  Lab 08/18/22 0811 08/18/22 1130 08/18/22 1645 08/18/22 2120 08/19/22 0605  GLUCAP 252* 259* 246* 285* 264*   Lipid Profile: No results for input(s): "CHOL", "HDL", "LDLCALC", "TRIG", "CHOLHDL", "LDLDIRECT" in the last 72 hours. Thyroid Function Tests: No results for input(s): "TSH", "T4TOTAL", "FREET4", "T3FREE", "THYROIDAB" in the last 72 hours.  Anemia Panel: No results for input(s): "VITAMINB12", "FOLATE", "FERRITIN", "TIBC", "IRON", "RETICCTPCT" in the last 72 hours. Sepsis  Labs: Recent Labs  Lab 08/16/22 0049 08/16/22 0227 08/16/22 0455 08/17/22 0244  PROCALCITON  --   --  2.19  --   LATICACIDVEN >9.0* >9.0* 7.7* 2.8*    Recent Results (from the past 240 hour(s))  Culture, blood (single)     Status: None (Preliminary result)   Collection Time: 08/16/22  2:27 AM   Specimen: BLOOD  Result Value Ref Range Status   Specimen Description BLOOD LEFT ANTECUBITAL  Final   Special Requests   Final    BOTTLES DRAWN AEROBIC AND ANAEROBIC Blood Culture adequate volume   Culture   Final    NO GROWTH 3 DAYS Performed at [facility], [place] [address] [place], [place] [address]    Report Status PENDING  Incomplete  MRSA Next Gen by PCR, Nasal     Status: None   Collection Time: 08/16/22  4:02 AM   Specimen: Nasal Mucosa; Nasal Swab  Result Value Ref Range Status   MRSA by PCR Next Gen NOT DETECTED NOT DETECTED Final    Comment: (NOTE) The GeneXpert MRSA Assay (FDA approved for NASAL specimens only), is one component of a comprehensive MRSA colonization surveillance program. It is not intended to diagnose MRSA infection nor to guide or monitor treatment for MRSA infections. Test performance is not FDA approved in patients less than 31 years old. Performed at [facility], [place] [address],  [place], [place] [address]   Culture, blood (Routine X 2) w Reflex to ID Panel     Status: None (Preliminary result)   Collection Time: 08/16/22  4:55 AM   Specimen: BLOOD  Result Value Ref Range Status   Specimen Description BLOOD BLOOD LEFT HAND  Final   Special Requests   Final    BOTTLES DRAWN AEROBIC AND ANAEROBIC Blood Culture adequate volume   Culture   Final    NO GROWTH 3 DAYS Performed at [facility], [place] [address], [place], [place] [address]    Report Status PENDING  Incomplete  Urine Culture (for pregnant, neutropenic or urologic patients or patients with an indwelling urinary catheter)     Status: Abnormal   Collection Time: 08/16/22  5:58 AM   Specimen: Urine, Clean Catch  Result Value Ref Range Status   Specimen Description URINE, CLEAN CATCH  Final   Special Requests NONE  Final   Culture (A)  Final    <10,000 COLONIES/mL INSIGNIFICANT GROWTH Performed at [facility] [address], [place], [place] [address]    Report Status 08/17/2022 FINAL  Final         Radiology Studies: No results found.      Scheduled Meds:  aspirin  81 mg Oral Daily   carbamazepine  600 mg Oral BID   Chlorhexidine Gluconate Cloth  6 each Topical Daily   desvenlafaxine  100 mg Oral Daily   ezetimibe  10 mg Oral QHS   heparin  5,000 Units Subcutaneous Q8H   insulin aspart  0-5 Units Subcutaneous QHS   insulin aspart  0-9 Units Subcutaneous TID WC   insulin aspart  6 Units Subcutaneous TID WC   insulin glargine-yfgn  50 Units Subcutaneous Q24H   isosorbide mononitrate  60 mg Oral Daily   levETIRAcetam  750 mg Oral BID   metoprolol tartrate  25 mg Oral BID   pneumococcal 23 valent vaccine  0.5 mL Intramuscular Tomorrow-1000   rosuvastatin  40 mg Oral QHS   tamsulosin  0.4 mg  Oral Daily   Continuous Infusions:  lactated ringers 100 mL/hr at 08/19/22 0016   promethazine (PHENERGAN) injection (IM or IVPB)       LOS: 3 days    Time spent: 35  minutes    Barb Merino, MD [facility] Pager [phone]

## 2022-08-19 NOTE — Progress Notes (Signed)
Pt is alert, oriented x 2, disoriented to time and situations, but stable hemodynamically, afebrile, no acute distress noted. Pt is having watery diarrhea. On rectal pouch draining to fecal bag. Denies abdominal pain, no nausea or vomiting. Pt states he has increasing appetite today. No sings of hyper/hypoglycemia. CBG is checked [facility]. We will monitor.   Kennyth Lose, RN

## 2022-08-20 ENCOUNTER — Inpatient Hospital Stay (HOSPITAL_COMMUNITY): Payer: Medicare PPO

## 2022-08-20 DIAGNOSIS — I6389 Other cerebral infarction: Secondary | ICD-10-CM | POA: Diagnosis present

## 2022-08-20 DIAGNOSIS — R4182 Altered mental status, unspecified: Secondary | ICD-10-CM

## 2022-08-20 DIAGNOSIS — G9340 Encephalopathy, unspecified: Secondary | ICD-10-CM | POA: Diagnosis not present

## 2022-08-20 DIAGNOSIS — E1065 Type 1 diabetes mellitus with hyperglycemia: Secondary | ICD-10-CM | POA: Diagnosis not present

## 2022-08-20 DIAGNOSIS — N179 Acute kidney failure, unspecified: Secondary | ICD-10-CM | POA: Diagnosis not present

## 2022-08-20 DIAGNOSIS — G9341 Metabolic encephalopathy: Secondary | ICD-10-CM | POA: Diagnosis not present

## 2022-08-20 LAB — BASIC METABOLIC PANEL
Anion gap: 8 (ref 5–15)
BUN: 11 mg/dL (ref 6–20)
CO2: 28 mmol/L (ref 22–32)
Calcium: 8.4 mg/dL — ABNORMAL LOW (ref 8.9–10.3)
Chloride: 103 mmol/L (ref 98–111)
Creatinine, Ser: 0.76 mg/dL (ref 0.61–1.24)
GFR, Estimated: >60 mL/min (ref >60)
Glucose, Bld: 94 mg/dL (ref 70–99)
Potassium: 3.7 mmol/L (ref 3.5–5.1)
Sodium: 139 mmol/L (ref 135–145)

## 2022-08-20 LAB — GLUCOSE, CAPILLARY
Glucose-Capillary: 75 mg/dL (ref 70–99)
Glucose-Capillary: 256 mg/dL — ABNORMAL HIGH (ref 70–99)
Glucose-Capillary: 214 mg/dL — ABNORMAL HIGH (ref 70–99)
Glucose-Capillary: 137 mg/dL — ABNORMAL HIGH (ref 70–99)

## 2022-08-20 LAB — ECHOCARDIOGRAM COMPLETE
AR max vel: 3.43 cm2
AV Area VTI: 3.22 cm2
AV Area mean vel: 3.28 cm2
AV Mean grad: 3.5 mmHg
AV Peak grad: 5.9 mmHg
Ao pk vel: 1.22 m/s
Area-P 1/2: 3.53 cm2
Height: 71 in
S' Lateral: 2.3 cm
Weight: 3572.8 oz

## 2022-08-20 MED ORDER — PERFLUTREN LIPID MICROSPHERE
1.0000 mL | INTRAVENOUS | Status: AC | PRN
  Administered 2022-08-20: 4 mL via INTRAVENOUS

## 2022-08-20 MED ORDER — IOHEXOL 350 MG/ML SOLN
75.0000 mL | Freq: Once | INTRAVENOUS | Status: AC | PRN
  Administered 2022-08-20: 75 mL via INTRAVENOUS

## 2022-08-20 NOTE — Progress Notes (Signed)
Mobility Specialist Progress Note:   08/20/22 1000  Mobility  Activity Ambulated with assistance in hallway  Level of Assistance Standby assist, set-up cues, supervision of patient - no hands on  Assistive Device Front wheel walker  Distance Ambulated (ft) 480 ft  Activity Response Tolerated well  $Mobility charge 1 Mobility   Pt in bed willing to participate in mobility. No complaints of pain. Left at sink with tech to get washed up.   Gareth Eagle Remmy Crass Mobility Specialist Please contact via [employer] or  Rehab Office at [phone]

## 2022-08-20 NOTE — Progress Notes (Signed)
Pt is out of the floor for MRI. He is hemodynamically stable.   Kennyth Lose, RN

## 2022-08-20 NOTE — Progress Notes (Incomplete)
Attempted carotid ultrasound x3 will perform when schedule allows.

## 2022-08-20 NOTE — Progress Notes (Signed)
PROGRESS NOTE    Jeff [name]  [id number] DOB: 1966/05/09 DOA: 08/16/2022 PCP: Crist Infante, MD    Brief Narrative:  57 year old gentleman with history of type 1 diabetes on insulin pump at home, coronary artery disease with previous CABG and stenting, hypertension, hyperlipidemia, neurofibromatosis type I and seizure disorder who presented to the emergency room on 2/19 with altered mental status.  He was not feeling well for the last 24 hours.  He was found down in the bathroom.  In the emergency room he was confused and slow to respond to questions.  Blood sugars were more than 600.  Insulin pump was removed on arrival and cannula was found to be kinked.  Due to significant findings he was admitted to intensive care unit and treated for DKA with insulin drip, IV fluids.  Troponins more than 3000.  WBC was 26,000, lactic acid was more than 9. 2/22, patient continues to improve however with intermittent confusion and difficulties with balance, MRI showed multiple areas of punctate infarction.  Hemodynamically stable today.   Assessment & Plan:   Acute metabolic encephalopathy secondary to DKA Diabetic ketoacidosis in a patient with type 1 diabetes due to malfunctioning insulin pump.  Medically stabilizing. Delirium precautions.  Day and night reorientation.   Treated with insulin drip and now transition to subcu insulin.  Today on Levemir 50 units, prandial insulin to 6 units 3 times daily with meals.  Continue sliding scale insulin.   Pump malfunction, patient has to follow-up at endocrine office.  Will discharge patient on subcu insulin until he has a follow-up. Electrolytes are adequate.  Severe sepsis present on admission, unknown primary source. Leukocytosis, severe lactic acidosis and acute kidney injury may be all related to DKA. Blood cultures no growth.  Urine culture less than 10,000 colonies no significant growth.  Procalcitonin 2.7. Completed 3 days of IV Zosyn and  discontinued.  Acute/subacute multiple territory ischemic infarction: CT head on arrival 2/19 without any acute findings. MRI of the brain 2/22 with multiple punctate foci of infarcts without complication on both cerebral hemispheres, chronic microvascular ischemia and volume loss.  Scalp neurofibroma. Patient is already clinically improving. Check echocardiogram Will check carotid duplexes Known diabetic and currently working on insulin doses Blood pressures are adequate. Called and discussed with neurology for consultation.  Continue to work with PT OT.  Consult to speech. Likely home tomorrow.   Coronary artery disease  Hypertension  Hyperlipidemia  demand ischemia with troponin elevation in a patient with CAD: Troponins were greatly elevated but subsequently stabilized and trended down. Home medications including a statin and Zetia, Imdur, metoprolol resumed.  Lisinopril on hold.  Seizure disorder with neurofibromatosis type I, on Keppra and Tegretol.  Stable.  Anxiety and depression: On Pristiq at home.  Resumed.   Diarrhea: Probably iatrogenic with large dose of lactulose.  Improving.  Continue to mobilize with PT OT.  Anticipate home next 24 to 48 hours if mental status improves and safe mobility as well stroke workup.  DVT prophylaxis: heparin injection 5,000 Units Start: 08/16/22 0600   Code Status: Full code Family Communication: Wife at the bedside Disposition Plan: Status is: Inpatient Remains inpatient appropriate because: New diagnosis of acute stroke.   Consultants:  Critical care Neurology  Procedures:  None  Antimicrobials:  IV Zosyn 2/19--- 2/21   Subjective:  Patient seen and examined.  Wife at the bedside.  Today he is much more alert awake and oriented.  Yesterday evening he had some hallucinations but that  has improved now. He only used 1 dose of Imodium yesterday and diarrhea is much improved.  Appetite is fair.  Mobility is improving and he  was able to walk with minimum support for safety. Discussed about new MRI findings, will need neurology assessment and likely go home after that.  Objective: Vitals:   08/19/22 2311 08/20/22 0429 08/20/22 0500 08/20/22 0814  BP: 133/66 (!) 154/75  (!) 176/81  Pulse: 70 82  95  Resp: '16 18  18  '$ Temp: 97.7 F (36.5 C) 97.6 F (36.4 C)  99.4 F (37.4 C)  TempSrc: Oral Oral  Axillary  SpO2: 96% 98%  92%  Weight:   101.3 kg   Height:        Intake/Output Summary (Last 24 hours) at 08/20/2022 1048 Last data filed at 08/20/2022 0600 Gross per 24 hour  Intake 2848.1 ml  Output 1850 ml  Net 998.1 ml   Filed Weights   08/17/22 0500 08/19/22 0500 08/20/22 0500  Weight: 110.1 kg 110.4 kg 101.3 kg    Examination:  General exam: Appears calm and comfortable.  Alert oriented x 4.  No focal neurological deficits. Respiratory system: Clear to auscultation. Respiratory effort normal. Cardiovascular system: S1 & S2 heard, RRR. No JVD, murmurs, rubs, gallops or clicks. No pedal edema. Gastrointestinal system: Abdomen is nondistended, soft and nontender. No organomegaly or masses felt. Normal bowel sounds heard. Minimal loose stool in the rectal pouch. Central nervous system: No focal neurological deficits. Extremities: Symmetric 5 x 5 power.   Data Reviewed: I have personally reviewed following labs and imaging studies  CBC: Recent Labs  Lab 08/16/22 0035 08/16/22 0241 08/16/22 0455 08/16/22 0638 08/17/22 0244 08/18/22 0215  WBC 26.5*  --  27.8* 23.4* 20.5* 11.7*  NEUTROABS 21.7*  --   --   --   --   --   HGB 13.9  14.6 13.6 13.6 13.7 14.4 12.9*  HCT 44.9  43.0 40.0 41.8 39.5 39.7 37.3*  MCV 102.3*  --  96.5 91.4 86.5 89.9  PLT 397  --  302 235 207 0000000   Basic Metabolic Panel: Recent Labs  Lab 08/16/22 0455 08/16/22 0638 08/17/22 0244 08/17/22 0744 08/18/22 0215 08/19/22 0128 08/20/22 0151  NA 129*   < > 135 137 135 134* 139  K 5.6*   < > 3.5 3.6 3.8 3.5 3.7  CL  92*   < > 103 103 100 101 103  CO2 8*   < > '22 24 25 25 28  '$ GLUCOSE 937*   < > 186* 180* 270* 267* 94  BUN 39*   < > 25* 22* '17 13 11  '$ CREATININE 3.06*   < > 1.48* 1.19 0.92 0.81 0.76  CALCIUM 9.5   < > 9.0 9.0 8.5* 8.0* 8.4*  MG 2.6*  --   --   --   --   --   --    < > = values in this interval not displayed.   GFR: Estimated Creatinine Clearance: 122 mL/min (by C-G formula based on SCr of 0.76 mg/dL). Liver Function Tests: Recent Labs  Lab 08/16/22 0035  AST 50*  ALT 35  ALKPHOS 89  BILITOT 0.9  PROT 5.7*  ALBUMIN 3.5   No results for input(s): "LIPASE", "AMYLASE" in the last 168 hours. Recent Labs  Lab 08/16/22 0455 08/17/22 0244  AMMONIA 78* 26   Coagulation Profile: No results for input(s): "INR", "PROTIME" in the last 168 hours. Cardiac Enzymes: Recent Labs  Lab 08/16/22 1433  CKTOTAL 851*   BNP (last 3 results) No results for input(s): "PROBNP" in the last 8760 hours. HbA1C: No results for input(s): "HGBA1C" in the last 72 hours.  CBG: Recent Labs  Lab 08/19/22 0605 08/19/22 1109 08/19/22 1557 08/19/22 2122 08/20/22 0619  GLUCAP 264* 317* 228* 140* 75   Lipid Profile: No results for input(s): "CHOL", "HDL", "LDLCALC", "TRIG", "CHOLHDL", "LDLDIRECT" in the last 72 hours. Thyroid Function Tests: No results for input(s): "TSH", "T4TOTAL", "FREET4", "T3FREE", "THYROIDAB" in the last 72 hours.  Anemia Panel: No results for input(s): "VITAMINB12", "FOLATE", "FERRITIN", "TIBC", "IRON", "RETICCTPCT" in the last 72 hours. Sepsis Labs: Recent Labs  Lab 08/16/22 0049 08/16/22 0227 08/16/22 0455 08/17/22 0244  PROCALCITON  --   --  2.19  --   LATICACIDVEN >9.0* >9.0* 7.7* 2.8*    Recent Results (from the past 240 hour(s))  Culture, blood (single)     Status: None (Preliminary result)   Collection Time: 08/16/22  2:27 AM   Specimen: BLOOD  Result Value Ref Range Status   Specimen Description BLOOD LEFT ANTECUBITAL  Final   Special Requests   Final     BOTTLES DRAWN AEROBIC AND ANAEROBIC Blood Culture adequate volume   Culture   Final    NO GROWTH 4 DAYS Performed at [facility], [place] [address], [place], [place] [address]    Report Status PENDING  Incomplete  MRSA Next Gen by PCR, Nasal     Status: None   Collection Time: 08/16/22  4:02 AM   Specimen: Nasal Mucosa; Nasal Swab  Result Value Ref Range Status   MRSA by PCR Next Gen NOT DETECTED NOT DETECTED Final    Comment: (NOTE) The GeneXpert MRSA Assay (FDA approved for NASAL specimens only), is one component of a comprehensive MRSA colonization surveillance program. It is not intended to diagnose MRSA infection nor to guide or monitor treatment for MRSA infections. Test performance is not FDA approved in patients less than 24 years old. Performed at [facility], [place] [address], [place], [place] [address]   Culture, blood (Routine X 2) w Reflex to ID Panel     Status: None (Preliminary result)   Collection Time: 08/16/22  4:55 AM   Specimen: BLOOD  Result Value Ref Range Status   Specimen Description BLOOD BLOOD LEFT HAND  Final   Special Requests   Final    BOTTLES DRAWN AEROBIC AND ANAEROBIC Blood Culture adequate volume   Culture   Final    NO GROWTH 4 DAYS Performed at [facility], [place] [address], [place], [place] [address]    Report Status PENDING  Incomplete  Urine Culture (for pregnant, neutropenic or urologic patients or patients with an indwelling urinary catheter)     Status: Abnormal   Collection Time: 08/16/22  5:58 AM   Specimen: Urine, Clean Catch  Result Value Ref Range Status   Specimen Description URINE, CLEAN CATCH  Final   Special Requests NONE  Final   Culture (A)  Final    <10,000 COLONIES/mL INSIGNIFICANT GROWTH Performed at [facility] [address], [place], [place] [address]    Report Status 08/17/2022 FINAL  Final         Radiology Studies: MR BRAIN WO CONTRAST  Result Date: 08/19/2022 CLINICAL DATA:   Altered mental status EXAM: MRI HEAD WITHOUT CONTRAST TECHNIQUE: Multiplanar, multiecho pulse sequences of the brain and surrounding structures were obtained without intravenous contrast. COMPARISON:  03/26/2018 FINDINGS: Brain: Multiple punctate foci of abnormal diffusion restriction scattered within both cerebral hemispheres. No acute hemorrhage. No chronic microhemorrhage or siderosis. There is multifocal hyperintense T2-weighted signal within the white matter. Generalized volume loss. The midline structures are normal. Vascular: Normal flow voids. Skull and upper cervical spine: Unchanged remodeling of the left clivus. Left scalp lesion, unchanged, likely neurofibroma. Sinuses/Orbits: Bilateral optic nerve dural ectasia. Mild ethmoid air cell mucosal thickening. Other: None IMPRESSION: 1. Multiple punctate foci of abnormal diffusion restriction scattered within both cerebral hemispheres, consistent with acute to subacute infarcts. No hemorrhage or mass effect. 2. Findings of chronic microvascular ischemia and volume loss. 3. Unchanged left scalp lesion, likely neurofibroma. Electronically Signed   By: Ulyses Jarred M.D.   On: 08/19/2022 22:35        Scheduled Meds:  aspirin  81 mg Oral Daily   carbamazepine  600 mg Oral BID   Chlorhexidine Gluconate Cloth  6 each Topical Daily   desvenlafaxine  100 mg Oral Daily   ezetimibe  10 mg Oral QHS   heparin  5,000 Units Subcutaneous Q8H   insulin aspart  0-5 Units Subcutaneous QHS   insulin aspart  0-9 Units Subcutaneous TID WC   insulin aspart  6 Units Subcutaneous TID WC   insulin glargine-yfgn  50 Units Subcutaneous Q24H   isosorbide mononitrate  60 mg Oral Daily   levETIRAcetam  750 mg Oral BID   metoprolol tartrate  25 mg Oral BID   pneumococcal 23 valent vaccine  0.5 mL Intramuscular Tomorrow-1000   rosuvastatin  40 mg Oral QHS   tamsulosin  0.4 mg Oral Daily   Continuous Infusions:  lactated ringers 50 mL/hr at 08/20/22 0853    promethazine (PHENERGAN) injection (IM or IVPB)       LOS: 4 days    Time spent: 35 minutes    Barb Merino, MD [facility] Pager [phone]

## 2022-08-20 NOTE — Procedures (Addendum)
Patient Name: KAYMON LICEA  MRN: [id number]  Epilepsy Attending: Lora Havens  Referring Physician/Provider: Lorenza Chick, MD  Date: 08/20/2022  Duration: 23.31 mins  Patient history: 57yo M with ams. EEG to evaluate for seizure  Level of alertness: Awake  AEDs during EEG study: CBZ, LEV  Technical aspects: This EEG study was done with scalp electrodes positioned according to the 10-20 International system of electrode placement. Electrical activity was reviewed with band pass filter of 1-'70Hz'$ , sensitivity of 7 uV/mm, display speed of 70m/sec with a '60Hz'$  notched filter applied as appropriate. EEG data were recorded continuously and digitally stored.  Video monitoring was available and reviewed as appropriate.  Description: The posterior dominant rhythm consists of 8 Hz activity of moderate voltage (25-35 uV) seen predominantly in posterior head regions, symmetric and reactive to eye opening and eye closing. Hyperventilation and photic stimulation were not performed.     IMPRESSION: This study is within normal limits. No seizures or epileptiform discharges were seen throughout the recording.  Arelly Whittenberg OBarbra Sarks

## 2022-08-20 NOTE — Progress Notes (Signed)
OT Cancellation Note  Patient Details Name: CEDARIUS TEAGUE MRN: [id number] DOB: 1966-05-27   Cancelled Treatment:    Reason Eval/Treat Not Completed: Patient at procedure or test/ unavailable (echo)  Egon Dittus,HILLARY 08/20/2022, 2:55 PM Maurie Boettcher, OT/L   Acute OT Clinical Specialist Acute Rehabilitation Services Pager [phone] Office [phone]

## 2022-08-20 NOTE — Consult Note (Addendum)
Neurology Consultation  Reason for Consult: "some balance issues to walk, MRI shows punctate infarcts" Referring Physician: Dr. Sloan Leiter   CC: AMS  History is obtained from: patient and patient's wife  HPI: Jeff [name] is a 57 y.o. male with a PMHx of HLD, HTN, CAD with CABG, DM type 1 on insulin pump, neurofibromatosis type 1 who presents to the hospital due to a fall with head trauma and was found to have glucose in the 500's. He reports there was a malfunction with his insulin pump. Per patient's wife, on Sunday, she was helping the patient go to the bathroom and he slipped and fell. She reports the patient having his eyes open not responding so she called 911. While patient has been in the hospital, she reports the patient having confusion regarding if he has been transported to another room and then brought back but the wife states that did not happen. He also told his wife that he was seeing the parking lot outside his window and there was no parking lot. The patient does not feel like he was confused in those instances. Denies recent sick contact or medication changes.  He reports for the past six months, he would have sporadic moments where is did not feel balanced. He describes the episode as "the diabetic neuropathy flaring up which then makes me sway back and forth and that may cause some dizziness. So after about 90 seconds, I sit down and the symptoms get better". In those moments, he denies symptoms of dysphagia, dysarthria,or nausea/vomiting. Patient has a history of multiple falls, averaging about 4 falls in the past 2 years, stating he has head trauma and passes out briefly during those episodes.   Patient was diagnosed with nonconvulsive seizures in 1999 and is currently on Keppra and Tegretol. Patient's wife feels that the patient currently has staring spells about 3 times per week. Patient denies prodrome symptoms and reports post-ictal symptoms of confusion regarding the time.  Patient also was diagnosed with neurofibromatosis type 1 in 1999 and neurofibromas have been appearing in his face more often in the past 2 years.   Denies history of stroke. MRI on 2/22 shows punctate foci of abnormal diffusion restriction scattered within both cerebral hemispheres, consistent with acute to subacute infarcts.    LKW: Currently well. Had AMS onset 2/23. TNK given?: no  IR Thrombectomy? No  ROS: A complete ROS was performed and is negative except as noted in the HPI.   Past Medical History:  Diagnosis Date   Anxiety    CAD (coronary artery disease)    Cath 2011.  LAD stent patent, distal LAD occlusion, D1 100%, OM branch 80 - 90%, SVG to diag patent, LIMA to LAD occluded, SVG to RCA occluded, SVG to OM was occluded.   No change from previous cath.  Managed medically.    Cataract    Depression    Diabetes mellitus type I ([place])    on insulin pump at  home   Diabetic retinopathy    s/p vitrectomy and history of retinal surgery   Hemorrhoid    History of oxygen administration    nocutural use only at 2 l/m nasally.   HTN (hypertension)    Hyperlipidemia    Myocardial infarction [facility])    Neurofibromatosis    with neurofibroma lesion at the base of the skull   Seasonal allergic rhinitis    Seizure disorder [facility])    Sleep apnea    s/p oral surgery  Toe fracture, right    11-23-14 fracture right big toe-wearing a stability shoe     Family History  Problem Relation Age of Onset   Coronary artery disease Father 24   Diabetes Father        type II   Hypertension Father    Squamous cell carcinoma Brother    Arthritis Sister    Crohn's disease Mother    Hearing loss Mother      Social History:   reports that he has never smoked. He has never used smokeless tobacco. He reports that he does not drink alcohol and does not use drugs.  Medications  Current Facility-Administered Medications:    aspirin chewable tablet 81 mg, 81 mg, Oral, Daily, Mick Sell,  PA-C, 81 mg at 08/20/22 [id number]   carbamazepine (TEGRETOL XR) 12 hr tablet 600 mg, 600 mg, Oral, BID, Mick Sell, PA-C, 600 mg at 08/20/22 [id number]   Chlorhexidine Gluconate Cloth 2 % PADS 6 each, 6 each, Topical, Daily, Mick Sell, PA-C, 6 each at 08/20/22 0857   desvenlafaxine (PRISTIQ) 24 hr tablet 100 mg, 100 mg, Oral, Daily, Barb Merino, MD, 100 mg at 08/20/22 0852   dextrose 50 % solution 0-50 mL, 0-50 mL, Intravenous, PRN, Pollina, Gwenyth Allegra, MD   ezetimibe (ZETIA) tablet 10 mg, 10 mg, Oral, QHS, Payne, John D, PA-C, 10 mg at 08/19/22 2112   heparin injection 5,000 Units, 5,000 Units, Subcutaneous, Q8H, Mick Sell, PA-C, 5,000 Units at 08/20/22 [id number]   insulin aspart (novoLOG) injection 0-5 Units, 0-5 Units, Subcutaneous, QHS, Barb Merino, MD, 3 Units at 08/18/22 2143   insulin aspart (novoLOG) injection 0-9 Units, 0-9 Units, Subcutaneous, TID WC, Barb Merino, MD, 3 Units at 08/19/22 1623   insulin aspart (novoLOG) injection 6 Units, 6 Units, Subcutaneous, TID WC, Barb Merino, MD, 6 Units at 08/19/22 1623   insulin glargine-yfgn (SEMGLEE) injection 50 Units, 50 Units, Subcutaneous, Q24H, Barb Merino, MD, 50 Units at 08/20/22 0853   isosorbide mononitrate (IMDUR) 24 hr tablet 60 mg, 60 mg, Oral, Daily, Bowser, Laurel Dimmer, NP, 60 mg at 08/20/22 [id number]   lactated ringers infusion, , Intravenous, Continuous, Barb Merino, MD, Last Rate: 50 mL/hr at 08/20/22 0853, Rate Change at 08/20/22 0853   levETIRAcetam (KEPPRA) tablet 750 mg, 750 mg, Oral, BID, Mick Sell, PA-C, 750 mg at 08/20/22 [id number]   loperamide (IMODIUM) capsule 2 mg, 2 mg, Oral, PRN, Barb Merino, MD, 2 mg at 08/19/22 0846   metoprolol tartrate (LOPRESSOR) tablet 25 mg, 25 mg, Oral, BID, Bowser, Laurel Dimmer, NP, 25 mg at 08/20/22 0851   ondansetron (ZOFRAN) injection 4 mg, 4 mg, Intravenous, Q6H PRN, Julian Hy, DO, 4 mg at 08/16/22 1850   pneumococcal 23 valent vaccine (PNEUMOVAX-23) injection 0.5 mL, 0.5 mL,  Intramuscular, Tomorrow-1000, Marshall, Jessica, DO   promethazine (PHENERGAN) 6.25 mg in sodium chloride 0.9 % 50 mL IVPB, 6.25 mg, Intravenous, Q6H PRN, Noemi Chapel P, DO   rosuvastatin (CRESTOR) tablet 40 mg, 40 mg, Oral, QHS, Payne, John D, PA-C, 40 mg at 08/19/22 2111   tamsulosin (FLOMAX) capsule 0.4 mg, 0.4 mg, Oral, Daily, Julian Hy, DO, 0.4 mg at 08/20/22 [id number]   Exam: Current vital signs: BP (!) 176/81 (BP Location: Right Arm)   Pulse 95   Temp 99.4 F (37.4 C) (Axillary)   Resp 18   Ht '5\' 11"'$  (1.803 m)   Wt 101.3 kg   SpO2 92%   BMI 31.14 kg/m  Vital  signs in last 24 hours: Temp:  [97.6 F (36.4 C)-99.4 F (37.4 C)] 99.4 F (37.4 C) (02/23 0814) Pulse Rate:  [70-95] 95 (02/23 0814) Resp:  [16-18] 18 (02/23 0814) BP: (119-176)/(58-81) 176/81 (02/23 0814) SpO2:  [92 %-98 %] 92 % (02/23 0814) Weight:  [101.3 kg] 101.3 kg (02/23 0500)  GENERAL: Awake, alert, in no acute distress Psych: Affect appropriate for situation, patient is calm and cooperative with examination Head: Normocephalic and atraumatic, with right eye set in more deeply than the left  EENT: Normal conjunctivae, dry mucous membranes, no OP obstruction LUNGS: Normal respiratory effort. Non-labored breathing on room air CV: Regular rate and rhythm on telemetry ABDOMEN: Soft, non-tender, non-distended SKIN:  0.25 inch diffuse neurofibromas on face, posterior neck, and back, cafe au lait spot on back, ecchymosis appearing fluid-filled 2 inch area on right lower back Extremities: warm, well perfused, without obvious deformity  NEURO:  Mental Status: Awake, alert, and oriented to person, place, time, and situation. He/She is able to provide a clear and coherent history of present illness. Speech/Language: speech is fair.   Naming, repetition, fluency, and comprehension intact without aphasia  No neglect is noted Cranial Nerves:  II: PERRLA. visual fields limited in lower peripheral quadrants  III,  IV, VI: EOMI. Lid elevation symmetric and full.  V: Sensation is intact to light touch and symmetrical to face.  VII: Face is symmetric resting and smiling. Able to puff cheeks and raise eyebrows.  VIII: Hearing intact to voice IX, X: Palate elevation is symmetric. Phonation normal.  XI: Normal sternocleidomastoid and trapezius muscle strength XII: Tongue protrudes midline without fasciculations.   Motor: 5/5 strength is all muscle groups.  Tone is normal. Bulk is normal.  Sensation: Intact to light touch bilaterally in all four extremities. No extinction to DSS present.  Coordination: FTN intact bilaterally, some slowing. HKS intact bilaterally. No pronator drift. Alternating hand movements.  Reflexes: 1+ and symmetric at the patella, 1+ and symmetric brachioradialis Gait: Wide-based shuffling steps but able to ambulate a few steps from his bed to the chair (limited by equipment in place)  NIHSS: TOTAL: 0   Labs I have reviewed labs in epic and the results pertinent to this consultation are:   CBC    Component Value Date/Time   WBC 11.7 (H) 08/18/2022 0215   RBC 4.15 (L) 08/18/2022 0215   HGB 12.9 (L) 08/18/2022 0215   HGB 15.1 04/22/2021 1602   HCT 37.3 (L) 08/18/2022 0215   HCT 43.2 04/22/2021 1602   PLT 160 08/18/2022 0215   PLT 194 04/22/2021 1602   MCV 89.9 08/18/2022 0215   MCV 89 04/22/2021 1602   MCH 31.1 08/18/2022 0215   MCHC 34.6 08/18/2022 0215   RDW 13.0 08/18/2022 0215   RDW 12.2 04/22/2021 1602   LYMPHSABS 2.7 08/16/2022 0035   LYMPHSABS 1.1 04/22/2021 1602   MONOABS 1.9 (H) 08/16/2022 0035   EOSABS 0.0 08/16/2022 0035   EOSABS 0.1 04/22/2021 1602   BASOSABS 0.3 (H) 08/16/2022 0035   BASOSABS 0.0 04/22/2021 1602    CMP     Component Value Date/Time   NA 139 08/20/2022 0151   NA 141 04/22/2021 1602   K 3.7 08/20/2022 0151   CL 103 08/20/2022 0151   CO2 28 08/20/2022 0151   GLUCOSE 94 08/20/2022 0151   BUN 11 08/20/2022 0151   BUN 9 04/22/2021  1602   CREATININE 0.76 08/20/2022 0151   CALCIUM 8.4 (L) 08/20/2022 0151   PROT 5.7 (  L) 08/16/2022 0035   PROT 6.1 04/22/2021 1602   ALBUMIN 3.5 08/16/2022 0035   ALBUMIN 4.3 04/22/2021 1602   AST 50 (H) 08/16/2022 0035   ALT 35 08/16/2022 0035   ALKPHOS 89 08/16/2022 0035   BILITOT 0.9 08/16/2022 0035   BILITOT 0.3 04/22/2021 1602   GFRNONAA >60 08/20/2022 0151   GFRAA >60 03/09/2018 1707    Lipid Panel     Component Value Date/Time   CHOL 138 06/03/2011 0640   TRIG 111 06/03/2011 0640   HDL 42 06/03/2011 0640   CHOLHDL 3.3 06/03/2011 0640   VLDL 22 06/03/2011 0640   LDLCALC 74 06/03/2011 0640    Lab Results  Component Value Date   HGBA1C 7.4 (H) 08/16/2022    Imaging I have reviewed the images obtained:  CT-scan of the brain IMPRESSION: 1. No acute intracranial process. 2. No acute fracture or traumatic listhesis in the cervical spine.  MRI examination of the brain IMPRESSION: 1. Multiple punctate foci of abnormal diffusion restriction scattered within both cerebral hemispheres, consistent with acute to subacute infarcts. No hemorrhage or mass effect. 2. Findings of chronic microvascular ischemia and volume loss. 3. Unchanged left scalp lesion, likely neurofibroma.  Assessment:  Jeff [name] is a 57 y.o. male with a PMHx of HLD, HTN, CAD with CABG, DM type 1 on insulin pump, neurofibromatosis type 1 who presents to the hospital due to a fall with head trauma and was found to have glucose in the 500's. Patient's mentation has greatly improved through his hospital stay. The etiology seems multifactorial with recent head trauma and loss of consciousness most likely 2/2 hyperglycemia along with the MRI results of punctate infarcts. Patient also has a history of nonconvulsive seizures that seem uncontrolled.  Recommendations:  # Stroke workup > Multifocal acute ischemic strokes c/f central embolic source, given recent DKA - A1c goal < 7%, adjust meds as needed per  PCP / primary team - Near LDL goal of < 70, continue current treatment (on rousvastatin and zetia as well as praulent) - CTA head and neck  - If TTE negative, consider TEE for further eval - LE Duplex - TCD w/ bubble - Since he's been tolerating aspirin, continue, hold off on Plavix given concern we may find indication for anticoagulation - monitor fever curve, repeat blood cultures if temp > 100.4 F  - Normotension blood pressure goal given no clear time of stroke with many of them being subacute and very small size - Appreciate PT/OT/SLP already involved  # AMS, improving > DDx hospital delirium, toxic/metabolic secondary to DKA, less likely NCSE - EEG given family report of persistent confusion   Coralyn Pear, MD PGY-1 Psychiatry  Attending Neurologist's note:  I personally saw this patient, gathering history, performing a full neurologic examination, reviewing relevant labs, personally reviewing relevant imaging including MRI brain, and formulated the assessment and plan, adding the note above for completeness and clarity to accurately reflect my thoughts   Lesleigh Noe MD-PhD [facility] [phone]

## 2022-08-20 NOTE — TOC Progression Note (Signed)
Transition of Care (TOC) - Progression Note  Valentina Gu, BSN Transitions of Care Unit 4E- RN Case Manager See Treatment Team for direct phone #   Patient Details  Name: ASEEM MERISIER MRN: [id number] Date of Birth: 05-21-1966  Transition of Care [facility]) CM/SW Contact  Dahlia Client Romeo Rabon, RN Phone Number: 08/20/2022, 2:22 PM  Clinical Narrative:    Follow up done at bedside with wife for transition needs. Discussed HH and DME- confirmed [facility] is still [place] agency of choice- HH orders, referral called to [facility] and accepted will follow for discharge and orders.   Per wife they will need DME- RW, BSC and requesting a hospital bed as well as pt will have to stay on lower level of town house to begin with. Wife does not have preference for DME agency. Will refer out to [facility] for coordination of delivery to the home (hopefully by Monday- as wife needs to clear a space for bed)  Wife reports that pt is under a grant for medications that [facility] assisted pt with- Pt reports that Tiffany at [facility] helped with grant and he wants to see if the grant will assist with anything else- explained to pt and wife that pt's [employer] Medicare will cover his [place] and DME needs. CM reached out to [place] who will f/u regarding pt's questions.   Call made to [facility] for DME referral- in process.    Expected Discharge Plan: [place] Barriers to Discharge: Continued Medical Work up  Expected Discharge Plan and Services   Discharge Planning Services: CM Consult Post Acute Care Choice: Home Health, Durable Medical Equipment Living arrangements for the past 2 months:  [place])                 DME Arranged: [place] Rile rolling, Hospital bed, Bedside commode DME Agency: [facility] Date DME Agency Contacted: 08/20/22 Time DME Agency Contacted: [id number] Representative spoke with at [place]: [place]: [employer] Determinants of  Health ([place]) Interventions SDOH Screenings   Food Insecurity: No Food Insecurity (08/16/2022)  Housing: Low Risk  (08/16/2022)  Transportation Needs: No Transportation Needs (08/16/2022)  Utilities: Not At Risk (08/16/2022)  Tobacco Use: Low Risk  (08/17/2022)    Readmission Risk Interventions     No data to display

## 2022-08-20 NOTE — Progress Notes (Signed)
Pt is pleasant, alert and fully oriented x 4, but somewhat forgetful, stable hemodynamically, afebrile. No more watery stool after imodium given on day shift.   Pt's wife stays at bed side tonight. She expresses her appreciations that Pt is clinically improving and expecting to go back home soon. Pt has a very good appetite today. No acute distress noted.  We will continue to monitor.    Kennyth Lose, RN

## 2022-08-20 NOTE — Consult Note (Signed)
   [facility] CM Inpatient Consult   08/20/2022  [name] [birth year] [id number]  [place] [facility] [ACO] Patient: Jeff [name]  Primary Care Provider: Crist Infante, MD with [facility]    Patient was referred from inpatient [facility] RNCM for hospitalization with noted medium risk score for unplanned readmission risk  noted and to assess for potential [place] Management service needs for post hospital transition for care coordination.  Review of patient's electronic medical record reveals patient is for test today.  2:35 pm Came by to speak with patient however patient iin the midst of a bedside procedure, will follow up. Left brochure and 24 hr nurse line magnet, and spoke with wife, will call them back on wife's phone due to ongoing procedures.  2:43 pm:   Placed at 2:29 pm left a message and received a call from Jeff [name], [place] regarding the grant the patient and wife are inquiring about.  Jeff [name] states that the grant is for covering his [name] Part D need.  5:05 pm Call placed to wife, Jeff [name], HIPAA verified. Explained findings regarding grant.  Also, explained ongoing [employer] Coordination benefits for post hospital follow up care.    Plan:  Continue to follow progress and disposition to assess for post hospital community care coordination/management needs.  Referral request for community care coordination: will refer closer to discharge transition.  Of note, [facility] Care Management/Population Health does not replace or interfere with any arrangements made by the Inpatient Transition of Care team.  For questions contact:   Jeff Brood, RN BSN [place]  [phone] business mobile phone Toll free office [phone]  *[place]  [phone] Fax number:  [phone] [email or web address]

## 2022-08-20 NOTE — Progress Notes (Signed)
Echocardiogram 2D Echocardiogram has been performed.  Jeff [name] 08/20/2022, 3:16 PM

## 2022-08-21 DIAGNOSIS — I6389 Other cerebral infarction: Secondary | ICD-10-CM

## 2022-08-21 DIAGNOSIS — E1010 Type 1 diabetes mellitus with ketoacidosis without coma: Secondary | ICD-10-CM | POA: Diagnosis not present

## 2022-08-21 DIAGNOSIS — E875 Hyperkalemia: Secondary | ICD-10-CM | POA: Diagnosis not present

## 2022-08-21 DIAGNOSIS — G9341 Metabolic encephalopathy: Secondary | ICD-10-CM | POA: Diagnosis not present

## 2022-08-21 LAB — BASIC METABOLIC PANEL
Anion gap: 10 (ref 5–15)
BUN: 9 mg/dL (ref 6–20)
CO2: 25 mmol/L (ref 22–32)
Calcium: 8.2 mg/dL — ABNORMAL LOW (ref 8.9–10.3)
Chloride: 103 mmol/L (ref 98–111)
Creatinine, Ser: 0.72 mg/dL (ref 0.61–1.24)
GFR, Estimated: >60 mL/min (ref >60)
Glucose, Bld: 117 mg/dL — ABNORMAL HIGH (ref 70–99)
Potassium: 3.8 mmol/L (ref 3.5–5.1)
Sodium: 138 mmol/L (ref 135–145)

## 2022-08-21 LAB — CULTURE, BLOOD (ROUTINE X 2)
Culture: NO GROWTH
Special Requests: ADEQUATE

## 2022-08-21 LAB — CULTURE, BLOOD (SINGLE)
Culture: NO GROWTH
Special Requests: ADEQUATE

## 2022-08-21 LAB — LIPID PANEL
Cholesterol: 95 mg/dL (ref 0–200)
HDL: 39 mg/dL — ABNORMAL LOW (ref >40)
LDL Cholesterol: 25 mg/dL (ref 0–99)
Total CHOL/HDL Ratio: 2.4 RATIO
Triglycerides: 157 mg/dL — ABNORMAL HIGH (ref <150)
VLDL: 31 mg/dL (ref 0–40)

## 2022-08-21 LAB — ANTITHROMBIN III: AntiThromb III Func: 103 % (ref 75–120)

## 2022-08-21 LAB — GLUCOSE, CAPILLARY
Glucose-Capillary: 89 mg/dL (ref 70–99)
Glucose-Capillary: 223 mg/dL — ABNORMAL HIGH (ref 70–99)
Glucose-Capillary: 132 mg/dL — ABNORMAL HIGH (ref 70–99)
Glucose-Capillary: 94 mg/dL (ref 70–99)

## 2022-08-21 MED ORDER — PANTOPRAZOLE SODIUM 40 MG PO TBEC
40.0000 mg | DELAYED_RELEASE_TABLET | Freq: Every day | ORAL | Status: DC
  Administered 2022-08-21 – 2022-08-23 (×3): 40 mg via ORAL
  Filled 2022-08-21 (×3): qty 1

## 2022-08-21 NOTE — Progress Notes (Signed)
[facility]                                                                              [name], is a 57 y.o. male, DOB - 08-Feb-1966, [id number] Admit date - 08/16/2022    Outpatient Primary MD for the patient is Jeff Infante, MD  LOS - 5  days  Chief Complaint  Patient presents with   Hyperglycemia       Brief summary   Patient is a 57 year old male with type I DM, on insulin pump at home, CAD with previous CABG and PCI, HTN, HLP, neurofibromatosis type I and seizure disorder who presented to the emergency room on 2/19 with altered mental status.  He was not feeling well for the last 24 hours.  He was found down in the bathroom.  In ED, found to be confused, slow to respond to questions, CBGs > 600 Insulin pump was removed on arrival and cannula was found to be kinked.  Due to significant findings he was admitted to intensive care unit and treated for DKA with insulin drip, IV fluids.  Troponins more than 3000.  WBC was 26,000, lactic acid was more than 9. 2/22, patient continues to improve however with intermittent confusion and difficulties with balance, MRI showed multiple areas of punctate infarction. Neurology consulted   Assessment & Plan    Principal Problem:   DKA (diabetic ketoacidosis) ([place]) Uncontrolled diabetes mellitus type 1 due to malfunctioning insulin pump -Currently stabilized, patient was initially treated with insulin drip and IVF, now transitioned to subcu insulin -Hemoglobin A1c 7.4 on 08/16/2022 -CBGs stable today -Continue NovoLog 6 units 3 times daily AC, Semglee 15 units daily, sensitive sliding scale insulin CBG (last 3)  Recent Labs    08/20/22 1642 08/20/22 2146 08/21/22 0626  GLUCAP 214* 137* 89   Active Problems:   Acute metabolic encephalopathy   Acute arterial ischemic stroke, multifocal, mult vascular territories ([place]) -CT head on 2/19 without acute findings. -MRI brain showed multiple punctate foci of abnormal  diffusion restriction scattered within both cerebral hemispheres, consistent with acute to subacute infarct, no hemorrhage or mass effect.  Unchanged left scalp neurofibroma.   -CTA head and neck showed no emergent large vessel occlusion or hemodynamically significant stenosis of head and neck -Follow TCD, LE Dopplers -2D echo showed EF 60 to 65%, G1 DD, borderline dilatation of aortic root measuring 37 mm -Follow neurology recommendations if TEE is required -EEG normal, no seizures or epileptiform discharges - PT evaluation recommended home health PT OT Lipid panel showed triglycerides 157, LDL 25, HDL 39 -Continue aspirin 81 mg daily, Zetia 10 mg daily    AKI (acute kidney injury) ([place]) -Presented with creatinine of 3.15 likely due to #1 -Resolved, creatinine 0.72  History of CAD, HTN, HLP with troponin elevation -Troponins elevated likely due to demand ischemia from #1 - -2D echo showed EF 60 to 65%, G1 DD, borderline dilatation of aortic root measuring 37 mm - cont metoprolol, Zetia, Imdur     Hyperkalemia, AKI -Lisinopril on hold  Seizure disorder with neurofibromatosis type I -Continue Keppra, Tegretol  Severe sepsis present on admission, unknown  primary source. -Leukocytosis, severe lactic acidosis and acute kidney injury possibly related to DKA - Blood cultures showed no growth.   - Urine culture less than 10,000 colonies no significant growth.   - Procalcitonin 2.7. - Completed 3 days of IV Zosyn and discontinued.  Obesity Estimated body mass index is 33.39 kg/m as calculated from the following:   Height as of this encounter: '5\' 11"'$  (1.803 m).   Weight as of this encounter: 108.6 kg.  Code Status: Full code DVT Prophylaxis:  heparin injection 5,000 Units Start: 08/16/22 0600   Level of Care: Level of care: Med-Surg Family Communication: Updated patient's wife at the bed side Disposition Plan:      Remains inpatient appropriate: Stroke workup in  progress   Procedures:  MRI brain, CTA head and neck  Consultants:   Neurology  Antimicrobials:   Anti-infectives (From admission, onward)    Start     Dose/Rate Route Frequency Ordered Stop   08/16/22 1000  piperacillin-tazobactam (ZOSYN) IVPB 3.375 g  Status:  Discontinued        3.375 g 12.5 mL/hr over 240 Minutes Intravenous Every 8 hours 08/16/22 0434 08/18/22 1323   08/16/22 0300  piperacillin-tazobactam (ZOSYN) IVPB 3.375 g        3.375 g 100 mL/hr over 30 Minutes Intravenous  Once 08/16/22 0258 08/16/22 0614   08/16/22 0200  ceFEPIme (MAXIPIME) 2 g in sodium chloride 0.9 % 100 mL IVPB  Status:  Discontinued        2 g 200 mL/hr over 30 Minutes Intravenous  Once 08/16/22 0149 08/16/22 0224   08/16/22 0200  metroNIDAZOLE (FLAGYL) IVPB 500 mg  Status:  Discontinued        500 mg 100 mL/hr over 60 Minutes Intravenous  Once 08/16/22 0149 08/16/22 0224   08/16/22 0200  vancomycin (VANCOCIN) IVPB 1000 mg/200 mL premix  Status:  Discontinued        1,000 mg 200 mL/hr over 60 Minutes Intravenous  Once 08/16/22 0149 08/16/22 0159   08/16/22 0200  vancomycin (VANCOREADY) IVPB 2000 mg/400 mL        2,000 mg 200 mL/hr over 120 Minutes Intravenous  Once 08/16/22 0159 08/16/22 0848          Medications  aspirin  81 mg Oral Daily   carbamazepine  600 mg Oral BID   Chlorhexidine Gluconate Cloth  6 each Topical Daily   desvenlafaxine  100 mg Oral Daily   ezetimibe  10 mg Oral QHS   heparin  5,000 Units Subcutaneous Q8H   insulin aspart  0-5 Units Subcutaneous QHS   insulin aspart  0-9 Units Subcutaneous TID WC   insulin aspart  6 Units Subcutaneous TID WC   insulin glargine-yfgn  50 Units Subcutaneous Q24H   isosorbide mononitrate  60 mg Oral Daily   levETIRAcetam  750 mg Oral BID   metoprolol tartrate  25 mg Oral BID   pantoprazole  40 mg Oral Q0600   pneumococcal 23 valent vaccine  0.5 mL Intramuscular Tomorrow-1000   rosuvastatin  40 mg Oral QHS   tamsulosin  0.4 mg  Oral Daily      Subjective:   Jeff [name] was seen and examined today.  Tolerating diet, no acute abnormalities.  Wife at the bedside.  Alert and oriented, eating breakfast.  No acute nausea vomiting, abdominal pain, chest pain or shortness of breath.  No fevers   Objective:   Vitals:   08/20/22 2343 08/21/22 0406 08/21/22 0700 08/21/22 [id number]  BP: (!) 163/81 (!) 164/88  (!) 154/68  Pulse: 84 80  92  Resp: '18 17  18  '$ Temp: 98 F (36.7 C) 99 F (37.2 C)  99.1 F (37.3 C)  TempSrc: Oral Oral  Oral  SpO2: 94% 93%  93%  Weight:   108.6 kg   Height:        Intake/Output Summary (Last 24 hours) at 08/21/2022 1057 Last data filed at 08/21/2022 [id number] Gross per 24 hour  Intake 1508.14 ml  Output 3000 ml  Net -1491.86 ml     Wt Readings from Last 3 Encounters:  08/21/22 108.6 kg  04/21/22 105.7 kg  03/12/22 105.1 kg     Exam General: Alert and oriented x 3, NAD Cardiovascular: S1 S2 auscultated,  RRR Respiratory: Clear to auscultation bilaterally, no wheezing Gastrointestinal: Soft, nontender, nondistended, + bowel sounds Ext: no pedal edema bilaterally Neuro: Strength 5/5 upper and lower extremities bilaterally Psych: Normal affect and demeanor, alert and oriented x3     Data Reviewed:  I have personally reviewed following labs    CBC Lab Results  Component Value Date   WBC 11.7 (H) 08/18/2022   RBC 4.15 (L) 08/18/2022   HGB 12.9 (L) 08/18/2022   HCT 37.3 (L) 08/18/2022   MCV 89.9 08/18/2022   MCH 31.1 08/18/2022   PLT 160 08/18/2022   MCHC 34.6 08/18/2022   RDW 13.0 08/18/2022   LYMPHSABS 2.7 08/16/2022   MONOABS 1.9 (H) 08/16/2022   EOSABS 0.0 08/16/2022   BASOSABS 0.3 (H) 0000000     Last metabolic panel Lab Results  Component Value Date   NA 138 08/21/2022   K 3.8 08/21/2022   CL 103 08/21/2022   CO2 25 08/21/2022   BUN 9 08/21/2022   CREATININE 0.72 08/21/2022   GLUCOSE 117 (H) 08/21/2022   GFRNONAA >60 08/21/2022   GFRAA >60  03/09/2018   CALCIUM 8.2 (L) 08/21/2022   PHOS 2.7 04/13/2010   PROT 5.7 (L) 08/16/2022   ALBUMIN 3.5 08/16/2022   LABGLOB 1.8 04/22/2021   AGRATIO 2.4 (H) 04/22/2021   BILITOT 0.9 08/16/2022   ALKPHOS 89 08/16/2022   AST 50 (H) 08/16/2022   ALT 35 08/16/2022   ANIONGAP 10 08/21/2022    CBG (last 3)  Recent Labs    08/20/22 1642 08/20/22 2146 08/21/22 0626  GLUCAP 214* 137* 89      Coagulation Profile: No results for input(s): "INR", "PROTIME" in the last 168 hours.   Radiology Studies: I have personally reviewed the imaging studies  CT ANGIO HEAD NECK W WO CM  Result Date: 08/20/2022 CLINICAL DATA:  Altered mental status EXAM: CT ANGIOGRAPHY HEAD AND NECK TECHNIQUE: Multidetector CT imaging of the head and neck was performed using the standard protocol during bolus administration of intravenous contrast. Multiplanar CT image reconstructions and MIPs were obtained to evaluate the vascular anatomy. Carotid stenosis measurements (when applicable) are obtained utilizing NASCET criteria, using the distal internal carotid diameter as the denominator. RADIATION DOSE REDUCTION: This exam was performed according to the departmental dose-optimization program which includes automated exposure control, adjustment of the mA and/or kV according to patient size and/or use of iterative reconstruction technique. CONTRAST:  71m OMNIPAQUE IOHEXOL 350 MG/ML SOLN COMPARISON:  None Available. FINDINGS: CT HEAD FINDINGS Brain: There is no mass, hemorrhage or extra-axial collection. The size and configuration of the ventricles and extra-axial CSF spaces are normal. There is no acute or chronic infarction. There is hypoattenuation of the periventricular white matter, most commonly  indicating chronic ischemic microangiopathy. Skull: Unchanged appearance of left scalp mass Sinuses/Orbits: No fluid levels or advanced mucosal thickening of the visualized paranasal sinuses. No mastoid or middle ear effusion. The  orbits are normal. CTA NECK FINDINGS SKELETON: There is no bony spinal canal stenosis. No lytic or blastic lesion. OTHER NECK: Normal pharynx, larynx and major salivary glands. No cervical lymphadenopathy. Unremarkable thyroid gland. UPPER CHEST: Small pleural effusions AORTIC ARCH: There is calcific atherosclerosis of the aortic arch. There is no aneurysm, dissection or hemodynamically significant stenosis of the visualized portion of the aorta. Conventional 3 vessel aortic branching pattern. The visualized proximal subclavian arteries are widely patent. RIGHT CAROTID SYSTEM: No dissection, occlusion or aneurysm. There is mixed density atherosclerosis extending into the proximal ICA, resulting in less than 50% stenosis. LEFT CAROTID SYSTEM: Normal without aneurysm, dissection or stenosis. VERTEBRAL ARTERIES: Left dominant configuration. Both origins are clearly patent. There is no dissection, occlusion or flow-limiting stenosis to the skull base (V1-V3 segments). CTA HEAD FINDINGS POSTERIOR CIRCULATION: --Vertebral arteries: Normal V4 segments. --Inferior cerebellar arteries: Normal. --Basilar artery: Normal. --Superior cerebellar arteries: Normal. --Posterior cerebral arteries (PCA): Normal. ANTERIOR CIRCULATION: --Intracranial internal carotid arteries: Normal. --Anterior cerebral arteries (ACA): Normal. Both A1 segments are present. Patent anterior communicating artery (a-comm). --Middle cerebral arteries (MCA): Normal. VENOUS SINUSES: As permitted by contrast timing, patent. ANATOMIC VARIANTS: None Review of the MIP images confirms the above findings. IMPRESSION: 1. No emergent large vessel occlusion or hemodynamically significant stenosis of the head or neck. 2. Unchanged appearance of left scalp mass. 3. Small pleural effusions. 4.  Aortic atherosclerosis (ICD10-I70.0). Electronically Signed   By: Ulyses Jarred M.D.   On: 08/20/2022 21:34   EEG adult  Result Date: 08/20/2022 Lora Havens, MD      08/20/2022  7:27 PM Patient Name: Jeff [name] MRN: [id number] Epilepsy Attending: Lora Havens Referring Physician/Provider: Lorenza Chick, MD Date: 08/20/2022 Duration: 23.31 mins Patient history: 57yo M with ams. EEG to evaluate for seizure Level of alertness: Awake AEDs during EEG study: CBZ, LEV Technical aspects: This EEG study was done with scalp electrodes positioned according to the 10-20 International system of electrode placement. Electrical activity was reviewed with band pass filter of 1-'70Hz'$ , sensitivity of 7 uV/mm, display speed of 52m/sec with a '60Hz'$  notched filter applied as appropriate. EEG data were recorded continuously and digitally stored.  Video monitoring was available and reviewed as appropriate. Description: The posterior dominant rhythm consists of 8 Hz activity of moderate voltage (25-35 uV) seen predominantly in posterior head regions, symmetric and reactive to eye opening and eye closing. Hyperventilation and photic stimulation were not performed.   IMPRESSION: This study is within normal limits. No seizures or epileptiform discharges were seen throughout the recording. PLora Havens  ECHOCARDIOGRAM COMPLETE  Result Date: 08/20/2022    ECHOCARDIOGRAM REPORT   Patient Name:   Jeff DALTODate of Exam: 08/20/2022 Medical Rec #:  [id number]      Height:       71.0 in Accession #:    [id number]     Weight:       223.3 lb Date of Birth:  712-15-1967       BSA:          2.210 m Patient Age:    89years        BP:           132/69 mmHg Patient Gender: M  HR:           71 bpm. Exam Location:  Inpatient Procedure: 2D Echo Indications:    Stroke  History:        Patient has prior history of Echocardiogram examinations. CAD,                 Prior CABG; Risk Factors:Hypertension, Diabetes and                 Dyslipidemia.  Sonographer:    Ronny Flurry Referring Phys: [id number] [place]  1. Left ventricular ejection fraction, by estimation, is 60  to 65%. The left ventricle has normal function. The left ventricle has no regional wall motion abnormalities. There is mild concentric left ventricular hypertrophy. Left ventricular diastolic parameters are consistent with Grade I diastolic dysfunction (impaired relaxation).  2. Right ventricular systolic function is normal. The right ventricular size is not well visualized.  3. Left atrial size was mildly dilated.  4. The mitral valve is abnormal. Trivial mitral valve regurgitation.  5. The aortic valve is tricuspid. There is mild calcification of the aortic valve. There is mild thickening of the aortic valve. Aortic valve regurgitation is not visualized. Aortic valve sclerosis/calcification is present, without any evidence of aortic stenosis.  6. Aortic dilatation noted. There is borderline dilatation of the aortic root, measuring 37 mm. Conclusion(s)/Recommendation(s): No intracardiac source of embolism detected on this transthoracic study. Consider a transesophageal echocardiogram to exclude cardiac source of embolism if clinically indicated. FINDINGS  Left Ventricle: Left ventricular ejection fraction, by estimation, is 60 to 65%. The left ventricle has normal function. The left ventricle has no regional wall motion abnormalities. The left ventricular internal cavity size was normal in size. There is  mild concentric left ventricular hypertrophy. Left ventricular diastolic parameters are consistent with Grade I diastolic dysfunction (impaired relaxation). Right Ventricle: The right ventricular size is not well visualized. Right vetricular wall thickness was not well visualized. Right ventricular systolic function is normal. Left Atrium: Left atrial size was mildly dilated. Right Atrium: Right atrial size was not well visualized. Pericardium: There is no evidence of pericardial effusion. Mitral Valve: The mitral valve is abnormal. There is mild thickening of the mitral valve leaflet(s). There is mild  calcification of the mitral valve leaflet(s). Mild to moderate mitral annular calcification. Trivial mitral valve regurgitation. Tricuspid Valve: The tricuspid valve is normal in structure. Tricuspid valve regurgitation is mild. Aortic Valve: The aortic valve is tricuspid. There is mild calcification of the aortic valve. There is mild thickening of the aortic valve. Aortic valve regurgitation is not visualized. Aortic valve sclerosis/calcification is present, without any evidence of aortic stenosis. Aortic valve mean gradient measures 3.5 mmHg. Aortic valve peak gradient measures 5.9 mmHg. Aortic valve area, by VTI measures 3.22 cm. Pulmonic Valve: The pulmonic valve was normal in structure. Pulmonic valve regurgitation is trivial. Aorta: Aortic dilatation noted. There is borderline dilatation of the aortic root, measuring 37 mm. IAS/Shunts: The atrial septum is grossly normal.  LEFT VENTRICLE PLAX 2D LVIDd:         3.70 cm   Diastology LVIDs:         2.30 cm   LV e' medial:    12.00 cm/s LV PW:         1.50 cm   LV E/e' medial:  9.3 LV IVS:        1.60 cm   LV e' lateral:   9.90 cm/s LVOT diam:     2.20  cm   LV E/e' lateral: 11.3 LV SV:         71 LV SV Index:   32 LVOT Area:     3.80 cm  RIGHT VENTRICLE RV S prime:     9.73 cm/s LEFT ATRIUM           Index LA diam:      3.90 cm 1.76 cm/m LA Vol (A2C): 38.8 ml 17.56 ml/m LA Vol (A4C): 41.1 ml 18.60 ml/m  AORTIC VALVE AV Area (Vmax):    3.43 cm AV Area (Vmean):   3.28 cm AV Area (VTI):     3.22 cm AV Vmax:           121.50 cm/s AV Vmean:          83.950 cm/s AV VTI:            0.222 m AV Peak Grad:      5.9 mmHg AV Mean Grad:      3.5 mmHg LVOT Vmax:         109.67 cm/s LVOT Vmean:        72.467 cm/s LVOT VTI:          0.188 m LVOT/AV VTI ratio: 0.85  AORTA Ao Root diam: 3.70 cm Ao Asc diam:  3.50 cm MITRAL VALVE                TRICUSPID VALVE MV Area (PHT): 3.53 cm     TR Peak grad:   9.5 mmHg MV Decel Time: 215 msec     TR Vmax:        154.00 cm/s MV E  velocity: 112.00 cm/s MV A velocity: 116.00 cm/s  SHUNTS MV E/A ratio:  0.97         Systemic VTI:  0.19 m                             Systemic Diam: 2.20 cm Gwyndolyn Kaufman MD Electronically signed by Gwyndolyn Kaufman MD Signature Date/Time: 08/20/2022/4:00:35 PM    Final    MR BRAIN WO CONTRAST  Result Date: 08/19/2022 CLINICAL DATA:  Altered mental status EXAM: MRI HEAD WITHOUT CONTRAST TECHNIQUE: Multiplanar, multiecho pulse sequences of the brain and surrounding structures were obtained without intravenous contrast. COMPARISON:  03/26/2018 FINDINGS: Brain: Multiple punctate foci of abnormal diffusion restriction scattered within both cerebral hemispheres. No acute hemorrhage. No chronic microhemorrhage or siderosis. There is multifocal hyperintense T2-weighted signal within the white matter. Generalized volume loss. The midline structures are normal. Vascular: Normal flow voids. Skull and upper cervical spine: Unchanged remodeling of the left clivus. Left scalp lesion, unchanged, likely neurofibroma. Sinuses/Orbits: Bilateral optic nerve dural ectasia. Mild ethmoid air cell mucosal thickening. Other: None IMPRESSION: 1. Multiple punctate foci of abnormal diffusion restriction scattered within both cerebral hemispheres, consistent with acute to subacute infarcts. No hemorrhage or mass effect. 2. Findings of chronic microvascular ischemia and volume loss. 3. Unchanged left scalp lesion, likely neurofibroma. Electronically Signed   By: Ulyses Jarred M.D.   On: 08/19/2022 22:35       Jovann Luse M.D. [facility] 08/21/2022, 10:57 AM  Available via Epic secure chat 7am-7pm After 7 pm, please refer to night coverage provider listed on amion.

## 2022-08-21 NOTE — Progress Notes (Signed)
Mobility Specialist Progress Note:   08/21/22 0915  Mobility  Activity Ambulated with assistance in hallway  Level of Assistance Standby assist, set-up cues, supervision of patient - no hands on  Assistive Device Front wheel walker  Distance Ambulated (ft) 480 ft  Activity Response Tolerated well  Mobility Referral Yes  $Mobility charge 1 Mobility   Pt agreeable to mobility session. Required only supervision for safety. Pt back in bed with all needs met.   Nelta Numbers Mobility Specialist Please contact via SecureChat or  Rehab office at [phone]

## 2022-08-21 NOTE — Progress Notes (Addendum)
STROKE TEAM PROGRESS NOTE   INTERVAL HISTORY His wife  is at the bedside.  He is sitting in the bed in NAD  No new neurological events noted. Patient states he feels pretty good.   He feels like he is back to his baseline.  He is in DKA because his insulin pump line was pinched.  This has happened several times before.  Vitals:   08/21/22 0406 08/21/22 0700 08/21/22 0931 08/21/22 1129  BP: (!) 164/88  (!) 154/68 (!) 142/68  Pulse: 80  92   Resp: '17  18 18  '$ Temp: 99 F (37.2 C)  99.1 F (37.3 C) 98.4 F (36.9 C)  TempSrc: Oral  Oral Oral  SpO2: 93%  93%   Weight:  108.6 kg    Height:       CBC:  Recent Labs  Lab 08/16/22 0035 08/16/22 0241 08/17/22 0244 08/18/22 0215  WBC 26.5*   < > 20.5* 11.7*  NEUTROABS 21.7*  --   --   --   HGB 13.9  14.6   < > 14.4 12.9*  HCT 44.9  43.0   < > 39.7 37.3*  MCV 102.3*   < > 86.5 89.9  PLT 397   < > 207 160   < > = values in this interval not displayed.   Basic Metabolic Panel:  Recent Labs  Lab 08/16/22 0455 08/16/22 0638 08/20/22 0151 08/21/22 0119  NA 129*   < > 139 138  K 5.6*   < > 3.7 3.8  CL 92*   < > 103 103  CO2 8*   < > 28 25  GLUCOSE 937*   < > 94 117*  BUN 39*   < > 11 9  CREATININE 3.06*   < > 0.76 0.72  CALCIUM 9.5   < > 8.4* 8.2*  MG 2.6*  --   --   --    < > = values in this interval not displayed.   Lipid Panel:  Recent Labs  Lab 08/21/22 0959  CHOL 95  TRIG 157*  HDL 39*  CHOLHDL 2.4  VLDL 31  LDLCALC 25   HgbA1c:  Recent Labs  Lab 08/16/22 0455  HGBA1C 7.4*   Urine Drug Screen:  Recent Labs  Lab 08/16/22 0558  LABOPIA NONE DETECTED  COCAINSCRNUR NONE DETECTED  LABBENZ NONE DETECTED  AMPHETMU NONE DETECTED  THCU NONE DETECTED  LABBARB NONE DETECTED    Alcohol Level  Recent Labs  Lab 08/16/22 0035  ETH <10    IMAGING past 24 hours CT ANGIO HEAD NECK W WO CM  Result Date: 08/20/2022 CLINICAL DATA:  Altered mental status EXAM: CT ANGIOGRAPHY HEAD AND NECK TECHNIQUE:  Multidetector CT imaging of the head and neck was performed using the standard protocol during bolus administration of intravenous contrast. Multiplanar CT image reconstructions and MIPs were obtained to evaluate the vascular anatomy. Carotid stenosis measurements (when applicable) are obtained utilizing NASCET criteria, using the distal internal carotid diameter as the denominator. RADIATION DOSE REDUCTION: This exam was performed according to the departmental dose-optimization program which includes automated exposure control, adjustment of the mA and/or kV according to patient size and/or use of iterative reconstruction technique. CONTRAST:  62m OMNIPAQUE IOHEXOL 350 MG/ML SOLN COMPARISON:  None Available. FINDINGS: CT HEAD FINDINGS Brain: There is no mass, hemorrhage or extra-axial collection. The size and configuration of the ventricles and extra-axial CSF spaces are normal. There is no acute or chronic infarction. There is hypoattenuation of  the periventricular white matter, most commonly indicating chronic ischemic microangiopathy. Skull: Unchanged appearance of left scalp mass Sinuses/Orbits: No fluid levels or advanced mucosal thickening of the visualized paranasal sinuses. No mastoid or middle ear effusion. The orbits are normal. CTA NECK FINDINGS SKELETON: There is no bony spinal canal stenosis. No lytic or blastic lesion. OTHER NECK: Normal pharynx, larynx and major salivary glands. No cervical lymphadenopathy. Unremarkable thyroid gland. UPPER CHEST: Small pleural effusions AORTIC ARCH: There is calcific atherosclerosis of the aortic arch. There is no aneurysm, dissection or hemodynamically significant stenosis of the visualized portion of the aorta. Conventional 3 vessel aortic branching pattern. The visualized proximal subclavian arteries are widely patent. RIGHT CAROTID SYSTEM: No dissection, occlusion or aneurysm. There is mixed density atherosclerosis extending into the proximal ICA, resulting in  less than 50% stenosis. LEFT CAROTID SYSTEM: Normal without aneurysm, dissection or stenosis. VERTEBRAL ARTERIES: Left dominant configuration. Both origins are clearly patent. There is no dissection, occlusion or flow-limiting stenosis to the skull base (V1-V3 segments). CTA HEAD FINDINGS POSTERIOR CIRCULATION: --Vertebral arteries: Normal V4 segments. --Inferior cerebellar arteries: Normal. --Basilar artery: Normal. --Superior cerebellar arteries: Normal. --Posterior cerebral arteries (PCA): Normal. ANTERIOR CIRCULATION: --Intracranial internal carotid arteries: Normal. --Anterior cerebral arteries (ACA): Normal. Both A1 segments are present. Patent anterior communicating artery (a-comm). --Middle cerebral arteries (MCA): Normal. VENOUS SINUSES: As permitted by contrast timing, patent. ANATOMIC VARIANTS: None Review of the MIP images confirms the above findings. IMPRESSION: 1. No emergent large vessel occlusion or hemodynamically significant stenosis of the head or neck. 2. Unchanged appearance of left scalp mass. 3. Small pleural effusions. 4.  Aortic atherosclerosis (ICD10-I70.0). Electronically Signed   By: Ulyses Jarred M.D.   On: 08/20/2022 21:34   EEG adult  Result Date: 08/20/2022 Lora Havens, MD     08/20/2022  7:27 PM Patient Name: Jeff [name] MRN: [id number] Epilepsy Attending: Lora Havens Referring Physician/Provider: Lorenza Chick, MD Date: 08/20/2022 Duration: 23.31 mins Patient history: 57yo M with ams. EEG to evaluate for seizure Level of alertness: Awake AEDs during EEG study: CBZ, LEV Technical aspects: This EEG study was done with scalp electrodes positioned according to the 10-20 International system of electrode placement. Electrical activity was reviewed with band pass filter of 1-'70Hz'$ , sensitivity of 7 uV/mm, display speed of 40m/sec with a '60Hz'$  notched filter applied as appropriate. EEG data were recorded continuously and digitally stored.  Video monitoring was available  and reviewed as appropriate. Description: The posterior dominant rhythm consists of 8 Hz activity of moderate voltage (25-35 uV) seen predominantly in posterior head regions, symmetric and reactive to eye opening and eye closing. Hyperventilation and photic stimulation were not performed.   IMPRESSION: This study is within normal limits. No seizures or epileptiform discharges were seen throughout the recording. PLora Havens  ECHOCARDIOGRAM COMPLETE  Result Date: 08/20/2022    ECHOCARDIOGRAM REPORT   Patient Name:   RYASIN CURATOLADate of Exam: 08/20/2022 Medical Rec #:  [id number]      Height:       71.0 in Accession #:    [id number]     Weight:       223.3 lb Date of Birth:  706-28-1967       BSA:          2.210 m Patient Age:    89years        BP:           132/69 mmHg Patient Gender: M  HR:           71 bpm. Exam Location:  Inpatient Procedure: 2D Echo Indications:    Stroke  History:        Patient has prior history of Echocardiogram examinations. CAD,                 Prior CABG; Risk Factors:Hypertension, Diabetes and                 Dyslipidemia.  Sonographer:    Ronny Flurry Referring Phys: [id number] [place]  1. Left ventricular ejection fraction, by estimation, is 60 to 65%. The left ventricle has normal function. The left ventricle has no regional wall motion abnormalities. There is mild concentric left ventricular hypertrophy. Left ventricular diastolic parameters are consistent with Grade I diastolic dysfunction (impaired relaxation).  2. Right ventricular systolic function is normal. The right ventricular size is not well visualized.  3. Left atrial size was mildly dilated.  4. The mitral valve is abnormal. Trivial mitral valve regurgitation.  5. The aortic valve is tricuspid. There is mild calcification of the aortic valve. There is mild thickening of the aortic valve. Aortic valve regurgitation is not visualized. Aortic valve sclerosis/calcification is  present, without any evidence of aortic stenosis.  6. Aortic dilatation noted. There is borderline dilatation of the aortic root, measuring 37 mm. Conclusion(s)/Recommendation(s): No intracardiac source of embolism detected on this transthoracic study. Consider a transesophageal echocardiogram to exclude cardiac source of embolism if clinically indicated. FINDINGS  Left Ventricle: Left ventricular ejection fraction, by estimation, is 60 to 65%. The left ventricle has normal function. The left ventricle has no regional wall motion abnormalities. The left ventricular internal cavity size was normal in size. There is  mild concentric left ventricular hypertrophy. Left ventricular diastolic parameters are consistent with Grade I diastolic dysfunction (impaired relaxation). Right Ventricle: The right ventricular size is not well visualized. Right vetricular wall thickness was not well visualized. Right ventricular systolic function is normal. Left Atrium: Left atrial size was mildly dilated. Right Atrium: Right atrial size was not well visualized. Pericardium: There is no evidence of pericardial effusion. Mitral Valve: The mitral valve is abnormal. There is mild thickening of the mitral valve leaflet(s). There is mild calcification of the mitral valve leaflet(s). Mild to moderate mitral annular calcification. Trivial mitral valve regurgitation. Tricuspid Valve: The tricuspid valve is normal in structure. Tricuspid valve regurgitation is mild. Aortic Valve: The aortic valve is tricuspid. There is mild calcification of the aortic valve. There is mild thickening of the aortic valve. Aortic valve regurgitation is not visualized. Aortic valve sclerosis/calcification is present, without any evidence of aortic stenosis. Aortic valve mean gradient measures 3.5 mmHg. Aortic valve peak gradient measures 5.9 mmHg. Aortic valve area, by VTI measures 3.22 cm. Pulmonic Valve: The pulmonic valve was normal in structure. Pulmonic valve  regurgitation is trivial. Aorta: Aortic dilatation noted. There is borderline dilatation of the aortic root, measuring 37 mm. IAS/Shunts: The atrial septum is grossly normal.  LEFT VENTRICLE PLAX 2D LVIDd:         3.70 cm   Diastology LVIDs:         2.30 cm   LV e' medial:    12.00 cm/s LV PW:         1.50 cm   LV E/e' medial:  9.3 LV IVS:        1.60 cm   LV e' lateral:   9.90 cm/s LVOT diam:     2.20  cm   LV E/e' lateral: 11.3 LV SV:         71 LV SV Index:   32 LVOT Area:     3.80 cm  RIGHT VENTRICLE RV S prime:     9.73 cm/s LEFT ATRIUM           Index LA diam:      3.90 cm 1.76 cm/m LA Vol (A2C): 38.8 ml 17.56 ml/m LA Vol (A4C): 41.1 ml 18.60 ml/m  AORTIC VALVE AV Area (Vmax):    3.43 cm AV Area (Vmean):   3.28 cm AV Area (VTI):     3.22 cm AV Vmax:           121.50 cm/s AV Vmean:          83.950 cm/s AV VTI:            0.222 m AV Peak Grad:      5.9 mmHg AV Mean Grad:      3.5 mmHg LVOT Vmax:         109.67 cm/s LVOT Vmean:        72.467 cm/s LVOT VTI:          0.188 m LVOT/AV VTI ratio: 0.85  AORTA Ao Root diam: 3.70 cm Ao Asc diam:  3.50 cm MITRAL VALVE                TRICUSPID VALVE MV Area (PHT): 3.53 cm     TR Peak grad:   9.5 mmHg MV Decel Time: 215 msec     TR Vmax:        154.00 cm/s MV E velocity: 112.00 cm/s MV A velocity: 116.00 cm/s  SHUNTS MV E/A ratio:  0.97         Systemic VTI:  0.19 m                             Systemic Diam: 2.20 cm Gwyndolyn Kaufman MD Electronically signed by Gwyndolyn Kaufman MD Signature Date/Time: 08/20/2022/4:00:35 PM    Final     PHYSICAL EXAM  Temp:  [98 F (36.7 C)-99.1 F (37.3 C)] 98.4 F (36.9 C) (02/24 1129) Pulse Rate:  [80-92] 92 (02/24 0931) Resp:  [15-18] 18 (02/24 1129) BP: (127-164)/(61-88) 142/68 (02/24 1129) SpO2:  [93 %-95 %] 93 % (02/24 0931) Weight:  [108.6 kg] 108.6 kg (02/24 0700)  General - Well nourished, well developed, in no apparent distress. Cardiovascular - Regular rhythm and rate.  Skin: Multiple neurofibromas noted  in his face chest arms and back.  Mental Status -  Level of arousal and orientation to time, place, and person were intact. Language including expression, naming, repetition, comprehension was assessed and found intact. Attention span and concentration were normal. Recent and remote memory were intact. Fund of Knowledge was assessed and was intact.  Cranial Nerves II - XII - II - Visual field intact OU. III, IV, VI - disconjugate gaze V - Facial sensation intact bilaterally. VII - Facial movement intact bilaterally. VIII - Hearing & vestibular intact bilaterally. X - Palate elevates symmetrically. XI - Chin turning & shoulder shrug intact bilaterally. XII - Tongue protrusion intact.  Motor Strength - The patient's strength was normal in all extremities and pronator drift was absent.  Bulk was normal and fasciculations were absent.   Motor Tone - Muscle tone was assessed at the neck and appendages and was normal.  Sensory - Light touch, temperature/pinprick were assessed  and were symmetrical.    Coordination - The patient had normal movements in the hands and feet with no ataxia or dysmetria.  Tremor was absent.  Gait and Station - deferred.  ASSESSMENT/PLAN Jeff [name] is a 57 y.o. male with history of  type 1 diabetes on insulin pump at home, coronary artery disease with previous CABG and stenting, hypertension, hyperlipidemia, neurofibromatosis type I and seizure disorder who presented to the emergency room on 2/19 with altered mental status.  He was not feeling well for the last 24 hours.  He was found down in the bathroom. Was found to be in DKA with BG in the 600's and insulin pump not working   Stroke: Acute/subacute multiple bilateral ischemic infarcts  Etiology:  unclear, likely central embolic source  CT head No acute abnormality.  CTA head & neck NO LVO  MRI  Multiple punctate foci of abnormal diffusion restriction scattered within both cerebral hemispheres,  chronic microvascular ischemia and volume loss.  Dopplers bilateral LE- ordered TCD with bubble ordered. 2D Echo Ef 60-65%, mild concentric left ventricular  Hypertrophy, Left ventricular diastolic  parameters are consistent with Grade I diastolic dysfunction  Hypercoag panel pending  LDL 25 HgbA1c 7.4 VTE prophylaxis - heparin SQ    Diet   Diet heart healthy/carb modified Room service appropriate? Yes; Fluid consistency: Thin   aspirin 81 mg daily prior to admission, now on aspirin 81 mg daily.  Therapy recommendations:  pending Disposition:  pending  Seizure history  On Keppra and tegretol at home and continued in hospital  Follows with [facility]  EEG normal   Hypertension Home meds:  imdur, lisinopril, metoprolol Stable Permissive hypertension (OK if < 220/120) but gradually normalize in 5-7 days Long-term BP goal normotensive  Hyperlipidemia Home meds:  crestor and zetia and praluent, resumed in hospital LDL 25, goal < 70 Continue statin at discharge  Diabetes type II UnControlled Home meds:  insulin HgbA1c 7.4, goal < 7.0 Will need close follow up with PCP CBGs Recent Labs    08/20/22 2146 08/21/22 0626 08/21/22 1115  GLUCAP 137* 89 223*    SSI  Other Stroke Risk Factors Obesity, Body mass index is 33.39 kg/m., BMI >/= 30 associated with increased stroke risk, recommend weight loss, diet and exercise as appropriate  Coronary artery disease   Hospital day # [place] DNP, ACNPC-AG  [facility]  ATTENDING ATTESTATION:  57 year old with history of neurofibromatosis type I multiple small punctate strokes and recent DKA.  Exam is unremarkable and is back to baseline, he has numerous neurofibromas.  He should have excellent recovery as his strokes are small.  Will perform hypercoagulable workup along with cardioembolic workup given his age.  Will need echo as well as TEE.  DAPT therapy for now and telemetry to look for atrial fibrillation.  Dr.  Reeves Forth evaluated pt independently, reviewed imaging, chart, labs. Discussed and formulated plan with the Resident/APP. Changes were made to the note where appropriate. Please see APP/resident note above for details.     Daielle Melcher,MD   To contact Stroke Continuity provider, please refer to [email or web address] After hours, contact General Neurology

## 2022-08-21 NOTE — Evaluation (Signed)
Clinical/Bedside Swallow Evaluation Patient Details  Name: Jeff [name] MRN: [id number] Date of Birth: 12-Aug-1965  Today's Date: 08/21/2022 Time: SLP Start Time (ACUTE ONLY): 1235 SLP Stop Time (ACUTE ONLY): 1302 SLP Time Calculation (min) (ACUTE ONLY): 27 min  Past Medical History:  Past Medical History:  Diagnosis Date   Anxiety    CAD (coronary artery disease)    Cath 2011.  LAD stent patent, distal LAD occlusion, D1 100%, OM branch 80 - 90%, SVG to diag patent, LIMA to LAD occluded, SVG to RCA occluded, SVG to OM was occluded.   No change from previous cath.  Managed medically.    Cataract    Depression    Diabetes mellitus type I ([place])    on insulin pump at  home   Diabetic retinopathy    s/p vitrectomy and history of retinal surgery   Hemorrhoid    History of oxygen administration    nocutural use only at 2 l/m nasally.   HTN (hypertension)    Hyperlipidemia    Myocardial infarction [facility])    Neurofibromatosis    with neurofibroma lesion at the base of the skull   Seasonal allergic rhinitis    Seizure disorder (HCC)    Sleep apnea    s/p oral surgery   Toe fracture, right    11-23-14 fracture right big toe-wearing a stability shoe   Past Surgical History:  Past Surgical History:  Procedure Laterality Date   APPENDECTOMY     CARDIAC CATHETERIZATION     CATARACT EXTRACTION, BILATERAL     COLONOSCOPY WITH PROPOFOL N/A 07/21/2017   Procedure: COLONOSCOPY WITH PROPOFOL;  Surgeon: Milus Banister, MD;  Location: [facility] ENDOSCOPY;  Service: Endoscopy;  Laterality: N/A;   CORONARY ANGIOPLASTY     9'11,  CABPG grafts had failed.   CORONARY ARTERY BYPASS GRAFT     4 vessel 4/11   ESOPHAGOGASTRODUODENOSCOPY (EGD) WITH PROPOFOL N/A 12/05/2014   Procedure: ESOPHAGOGASTRODUODENOSCOPY (EGD) WITH PROPOFOL;  Surgeon: Milus Banister, MD;  Location: [facility] ENDOSCOPY;  Service: Endoscopy;  Laterality: N/A;   LAPAROSCOPIC APPENDECTOMY  11/24/2011   LEFT HEART CATHETERIZATION WITH  CORONARY/GRAFT ANGIOGRAM N/A 02/22/2013   Procedure: LEFT HEART CATHETERIZATION WITH Beatrix Fetters;  Surgeon: Sherren Mocha, MD;  Location: [facility] CATH LAB;  Service: Cardiovascular;  Laterality: N/A;   release of right transverse carpal ligament     right eye vitrectomy and detached retina repair  10/2007   TONSILLECTOMY     uvulopalatoplasty surgery     HPI:  Patient is a 57 year old male who presented to the emergency room on 2/19 with altered mental status.  He was not feeling well for the last 24 hours.  He was found down in the bathroom. There was malfuction with his insulin pump and found to be in DKA.  MRI 2/2 with: "Multiple punctate foci of abnormal diffusion restriction scattered within both cerebral hemispheres, consistent with acute to subacute infarcts. No hemorrhage or mass effect." Pt with pmhx type I DM, on insulin pump at home, CAD with previous CABG and PCI, HTN, HLP, neurofibromatosis type I and seizure disorder.    Assessment / Plan / Recommendation  Clinical Impression  Pt with grossly normal swallowing as assessed clinically. Pt eating midday meal on SLP arrival. There was intermittent coughing which did not appear to be consistently related to PO intake.  Pt states he has been having coughing fits triggered since admission but that they are not related to PO intake.  Reports one during transport  to CT. Pt complains of infrequent burning sensation in back of mouth and reports at least one instace of perceived esophageal backflow.  Pt reports hx of GERD, but no current issues, although he did once take pantoprazole this admission following the feeling of backflow.  Pt with clear CXR on arrival. Head CT 2/19 without acute finiding.  Multifocal infarcts on MRI 2/22.  Pt appears safe to continue current diet.  If symptoms persist, consider reflux assessment.     Recommend regular texture diet with thin liquid.  Pt with clear and intelligible speech.  No evidence of word  finding difficulty in high level conversation.  Pt and family note improved mental status.  Pt with no complaints of change to cognitive function.  If pt is not at baseline level of function, consider cognitive linguistic evaluation.   SLP Visit Diagnosis: Dysphagia, unspecified (R13.10)    Aspiration Risk  No limitations    Diet Recommendation Regular;Thin liquid   Liquid Administration via: Cup;Straw Medication Administration: Whole meds with liquid Supervision: Patient able to self feed Compensations: Slow rate;Small sips/bites Postural Changes: Seated upright at 90 degrees    Other  Recommendations Oral Care Recommendations: Oral care BID    Recommendations for follow up therapy are one component of a multi-disciplinary discharge planning process, led by the attending physician.  Recommendations may be updated based on patient status, additional functional criteria and insurance authorization.  Follow up Recommendations No SLP follow up      Assistance Recommended at Discharge  N/A  Functional Status Assessment Patient has not had a recent decline in their functional status  Frequency and Duration  (N/A)          Prognosis Prognosis for improved oropharyngeal function:  (N/A)      Swallow Study   General Date of Onset: 08/16/22 HPI: Patient is a 57 year old male who presented to the emergency room on 2/19 with altered mental status.  He was not feeling well for the last 24 hours.  He was found down in the bathroom. There was malfuction with his insulin pump and found to be in DKA.  MRI 2/2 with: "Multiple punctate foci of abnormal diffusion restriction scattered within both cerebral hemispheres, consistent with acute to subacute infarcts. No hemorrhage or mass effect." Pt with pmhx type I DM, on insulin pump at home, CAD with previous CABG and PCI, HTN, HLP, neurofibromatosis type I and seizure disorder. Type of Study: Bedside Swallow Evaluation Previous Swallow Assessment:  None Diet Prior to this Study: Regular Temperature Spikes Noted: No Respiratory Status: Room air History of Recent Intubation: No Behavior/Cognition: Alert;Cooperative;Pleasant mood Oral Cavity Assessment: Within Functional Limits Oral Care Completed by SLP: No Oral Cavity - Dentition: Adequate natural dentition Vision: Functional for self-feeding Self-Feeding Abilities: Able to feed self Patient Positioning: Upright in bed Baseline Vocal Quality: Normal Volitional Cough: Strong Volitional Swallow: Able to elicit    Oral/Motor/Sensory Function Overall Oral Motor/Sensory Function: Within functional limits Facial ROM: Within Functional Limits Facial Symmetry: Within Functional Limits Lingual ROM: Within Functional Limits Lingual Symmetry: Within Functional Limits Lingual Strength: Within Functional Limits Velum: Within Functional Limits Mandible: Within Functional Limits   Ice Chips Ice chips: Not tested   Thin Liquid Thin Liquid: Within functional limits    Nectar Thick Nectar Thick Liquid: Not tested   Honey Thick Honey Thick Liquid: Not tested   Puree Puree: Within functional limits Presentation: Spoon   Solid     Solid: Within functional limits Presentation: Self Fed  Celedonio Savage, [place], [address] Office: [phone] 08/21/2022,1:33 PM

## 2022-08-22 DIAGNOSIS — G9341 Metabolic encephalopathy: Secondary | ICD-10-CM | POA: Diagnosis not present

## 2022-08-22 DIAGNOSIS — E875 Hyperkalemia: Secondary | ICD-10-CM | POA: Diagnosis not present

## 2022-08-22 DIAGNOSIS — E1010 Type 1 diabetes mellitus with ketoacidosis without coma: Secondary | ICD-10-CM | POA: Diagnosis not present

## 2022-08-22 DIAGNOSIS — I6389 Other cerebral infarction: Secondary | ICD-10-CM | POA: Diagnosis not present

## 2022-08-22 LAB — BASIC METABOLIC PANEL
Anion gap: 12 (ref 5–15)
BUN: 6 mg/dL (ref 6–20)
CO2: 23 mmol/L (ref 22–32)
Calcium: 8.5 mg/dL — ABNORMAL LOW (ref 8.9–10.3)
Chloride: 104 mmol/L (ref 98–111)
Creatinine, Ser: 0.66 mg/dL (ref 0.61–1.24)
GFR, Estimated: >60 mL/min (ref >60)
Glucose, Bld: 70 mg/dL (ref 70–99)
Potassium: 3.6 mmol/L (ref 3.5–5.1)
Sodium: 139 mmol/L (ref 135–145)

## 2022-08-22 LAB — GLUCOSE, CAPILLARY
Glucose-Capillary: 79 mg/dL (ref 70–99)
Glucose-Capillary: 254 mg/dL — ABNORMAL HIGH (ref 70–99)
Glucose-Capillary: 199 mg/dL — ABNORMAL HIGH (ref 70–99)
Glucose-Capillary: 190 mg/dL — ABNORMAL HIGH (ref 70–99)

## 2022-08-22 LAB — CARDIOLIPIN ANTIBODIES, IGG, IGM, IGA
Anticardiolipin IgA: <9 APL U/mL (ref 0–11)
Anticardiolipin IgG: <9 GPL U/mL (ref 0–14)
Anticardiolipin IgM: <9 MPL U/mL (ref 0–12)

## 2022-08-22 LAB — BETA-2-GLYCOPROTEIN I ABS, IGG/M/A
Beta-2 Glyco I IgG: <9 GPI IgG units (ref 0–20)
Beta-2-Glycoprotein I IgA: <9 GPI IgA units (ref 0–25)
Beta-2-Glycoprotein I IgM: <9 GPI IgM units (ref 0–32)

## 2022-08-22 MED ORDER — CLOPIDOGREL BISULFATE 75 MG PO TABS
75.0000 mg | ORAL_TABLET | Freq: Every day | ORAL | Status: DC
  Administered 2022-08-22 – 2022-08-23 (×2): 75 mg via ORAL
  Filled 2022-08-22 (×2): qty 1

## 2022-08-22 NOTE — Progress Notes (Signed)
[facility]                                                                              Jeff [name], is a 57 y.o. male, DOB - [birth year], [id number] Admit date - 08/16/2022    Outpatient Primary MD for the patient is Jeff Infante, MD  LOS - 6  days  Chief Complaint  Patient presents with   Hyperglycemia       Brief summary   Patient is a 57 year old male with type I DM, on insulin pump at home, CAD with previous CABG and PCI, HTN, HLP, neurofibromatosis type I and seizure disorder who presented to the emergency room on 2/19 with altered mental status.  He was not feeling well for the last 24 hours.  He was found down in the bathroom.  In ED, found to be confused, slow to respond to questions, CBGs > 600 Insulin pump was removed on arrival and cannula was found to be kinked.  Due to significant findings he was admitted to intensive care unit and treated for DKA with insulin drip, IV fluids.  Troponins more than 3000.  WBC was 26,000, lactic acid was more than 9. 2/22, patient continues to improve however with intermittent confusion and difficulties with balance, MRI showed multiple areas of punctate infarction. Neurology consulted   Assessment & Plan    Principal Problem:   DKA (diabetic ketoacidosis) ([place]) Uncontrolled diabetes mellitus type 1 due to malfunctioning insulin pump -Currently stabilized, patient was initially treated with insulin drip and IVF, now transitioned to subcu insulin -Hemoglobin A1c 7.4 on 08/16/2022 -CBGs stable today -Continue NovoLog 6 units 3 times daily AC, Semglee 15 units daily, sensitive sliding scale insulin CBG (last 3)  Recent Labs    08/21/22 1620 08/21/22 2117 08/22/22 0611  GLUCAP 132* 94 79   Active Problems:   Acute metabolic encephalopathy   Acute arterial ischemic stroke, multifocal, mult vascular territories ([place]) -CT head on 2/19 without acute findings. -MRI brain showed multiple punctate foci of abnormal  diffusion restriction scattered within both cerebral hemispheres, consistent with acute to subacute infarct, no hemorrhage or mass effect.  Unchanged left scalp neurofibroma.   -CTA head and neck showed no emergent large vessel occlusion or hemodynamically significant stenosis of head and neck -Follow TCD, LE Dopplers, still pending -2D echo showed EF 60 to 65%, G1 DD, borderline dilatation of aortic root measuring 37 mm -EEG normal, no seizures or epileptiform discharges - PT evaluation recommended home health PT OT Lipid panel showed triglycerides 157, LDL 25, HDL 39 -Currently on aspirin and Zetia.  Continue aspirin 81 mg daily, Zetia 10 mg daily.  Neurology recommend DAPT, d/w Dr Jeff [name] -Message sent to cardiology regarding TEE scheduling    AKI (acute kidney injury) ([place]) -Presented with creatinine of 3.15 likely due to #1 -Resolved, creatinine 0.6  History of CAD, HTN, HLP with troponin elevation -Troponins elevated likely due to demand ischemia from #1 - 2D echo showed EF 60 to 65%, G1 DD, borderline dilatation of aortic root measuring 37 mm - cont metoprolol, Zetia, Imdur     Hyperkalemia, AKI -Lisinopril on hold,  -  K, creatinine improved  Seizure disorder with neurofibromatosis type I -Continue Keppra, Tegretol  Severe sepsis present on admission, unknown primary source. -Leukocytosis, severe lactic acidosis and acute kidney injury possibly related to DKA - Blood cultures showed no growth.   - Urine culture less than 10,000 colonies no significant growth.   - Procalcitonin 2.7. - Completed 3 days of IV Zosyn and discontinued.  Obesity Estimated body mass index is 33.52 kg/m as calculated from the following:   Height as of this encounter: '5\' 11"'$  (1.803 m).   Weight as of this encounter: 109 kg.  Code Status: Full code DVT Prophylaxis:  heparin injection 5,000 Units Start: 08/16/22 0600   Level of Care: Level of care: Med-Surg Family Communication: Updated  patient's wife at the bed side today Disposition Plan:      Remains inpatient appropriate: Stroke workup in progress   Procedures:  MRI brain, CTA head and neck  Consultants:   Neurology  Antimicrobials:   Anti-infectives (From admission, onward)    Start     Dose/Rate Route Frequency Ordered Stop   08/16/22 1000  piperacillin-tazobactam (ZOSYN) IVPB 3.375 g  Status:  Discontinued        3.375 g 12.5 mL/hr over 240 Minutes Intravenous Every 8 hours 08/16/22 0434 08/18/22 1323   08/16/22 0300  piperacillin-tazobactam (ZOSYN) IVPB 3.375 g        3.375 g 100 mL/hr over 30 Minutes Intravenous  Once 08/16/22 0258 08/16/22 0614   08/16/22 0200  ceFEPIme (MAXIPIME) 2 g in sodium chloride 0.9 % 100 mL IVPB  Status:  Discontinued        2 g 200 mL/hr over 30 Minutes Intravenous  Once 08/16/22 0149 08/16/22 0224   08/16/22 0200  metroNIDAZOLE (FLAGYL) IVPB 500 mg  Status:  Discontinued        500 mg 100 mL/hr over 60 Minutes Intravenous  Once 08/16/22 0149 08/16/22 0224   08/16/22 0200  vancomycin (VANCOCIN) IVPB 1000 mg/200 mL premix  Status:  Discontinued        1,000 mg 200 mL/hr over 60 Minutes Intravenous  Once 08/16/22 0149 08/16/22 0159   08/16/22 0200  vancomycin (VANCOREADY) IVPB 2000 mg/400 mL        2,000 mg 200 mL/hr over 120 Minutes Intravenous  Once 08/16/22 0159 08/16/22 0848          Medications  aspirin  81 mg Oral Daily   carbamazepine  600 mg Oral BID   Chlorhexidine Gluconate Cloth  6 each Topical Daily   desvenlafaxine  100 mg Oral Daily   ezetimibe  10 mg Oral QHS   heparin  5,000 Units Subcutaneous Q8H   insulin aspart  0-5 Units Subcutaneous QHS   insulin aspart  0-9 Units Subcutaneous TID WC   insulin aspart  6 Units Subcutaneous TID WC   insulin glargine-yfgn  50 Units Subcutaneous Q24H   isosorbide mononitrate  60 mg Oral Daily   levETIRAcetam  750 mg Oral BID   metoprolol tartrate  25 mg Oral BID   pantoprazole  40 mg Oral Q0600   pneumococcal  23 valent vaccine  0.5 mL Intramuscular Tomorrow-1000   rosuvastatin  40 mg Oral QHS   tamsulosin  0.4 mg Oral Daily      Subjective:   Jeff [name] was seen and examined today.  No acute complaints, wife at the bedside.  Eating breakfast.  Stated that he did walk yesterday and felt better.  No acute dizziness, lightheadedness, chest pain  or shortness of breath.  No focal weakness or numbness.    Objective:   Vitals:   08/21/22 2302 08/22/22 0423 08/22/22 0706 08/22/22 0828  BP: (!) 161/74 (!) 167/81    Pulse: 83 86    Resp:    18  Temp: 98.3 F (36.8 C) 98.4 F (36.9 C)  98.1 F (36.7 C)  TempSrc: Oral Oral  Oral  SpO2: 95% 92%    Weight:   109 kg   Height:        Intake/Output Summary (Last 24 hours) at 08/22/2022 1023 Last data filed at 08/22/2022 0700 Gross per 24 hour  Intake 1601.9 ml  Output 2250 ml  Net -648.1 ml     Wt Readings from Last 3 Encounters:  08/22/22 109 kg  04/21/22 105.7 kg  03/12/22 105.1 kg    Physical Exam General: Alert and oriented x 3, NAD Cardiovascular: S1 S2 clear, RRR.  Respiratory: CTAB Gastrointestinal: Soft, nontender, nondistended, NBS Ext: no pedal edema bilaterally Neuro: no new deficits Psych: Normal affect     Data Reviewed:  I have personally reviewed following labs    CBC Lab Results  Component Value Date   WBC 11.7 (H) 08/18/2022   RBC 4.15 (L) 08/18/2022   HGB 12.9 (L) 08/18/2022   HCT 37.3 (L) 08/18/2022   MCV 89.9 08/18/2022   MCH 31.1 08/18/2022   PLT 160 08/18/2022   MCHC 34.6 08/18/2022   RDW 13.0 08/18/2022   LYMPHSABS 2.7 08/16/2022   MONOABS 1.9 (H) 08/16/2022   EOSABS 0.0 08/16/2022   BASOSABS 0.3 (H) 0000000     Last metabolic panel Lab Results  Component Value Date   NA 139 08/22/2022   K 3.6 08/22/2022   CL 104 08/22/2022   CO2 23 08/22/2022   BUN 6 08/22/2022   CREATININE 0.66 08/22/2022   GLUCOSE 70 08/22/2022   GFRNONAA >60 08/22/2022   GFRAA >60 03/09/2018   CALCIUM  8.5 (L) 08/22/2022   PHOS 2.7 04/13/2010   PROT 5.7 (L) 08/16/2022   ALBUMIN 3.5 08/16/2022   LABGLOB 1.8 04/22/2021   AGRATIO 2.4 (H) 04/22/2021   BILITOT 0.9 08/16/2022   ALKPHOS 89 08/16/2022   AST 50 (H) 08/16/2022   ALT 35 08/16/2022   ANIONGAP 12 08/22/2022    CBG (last 3)  Recent Labs    08/21/22 1620 08/21/22 2117 08/22/22 0611  GLUCAP 132* 94 79      Coagulation Profile: No results for input(s): "INR", "PROTIME" in the last 168 hours.   Radiology Studies: I have personally reviewed the imaging studies  CT ANGIO HEAD NECK W WO CM  Result Date: 08/20/2022 CLINICAL DATA:  Altered mental status EXAM: CT ANGIOGRAPHY HEAD AND NECK TECHNIQUE: Multidetector CT imaging of the head and neck was performed using the standard protocol during bolus administration of intravenous contrast. Multiplanar CT image reconstructions and MIPs were obtained to evaluate the vascular anatomy. Carotid stenosis measurements (when applicable) are obtained utilizing NASCET criteria, using the distal internal carotid diameter as the denominator. RADIATION DOSE REDUCTION: This exam was performed according to the departmental dose-optimization program which includes automated exposure control, adjustment of the mA and/or kV according to patient size and/or use of iterative reconstruction technique. CONTRAST:  71m OMNIPAQUE IOHEXOL 350 MG/ML SOLN COMPARISON:  None Available. FINDINGS: CT HEAD FINDINGS Brain: There is no mass, hemorrhage or extra-axial collection. The size and configuration of the ventricles and extra-axial CSF spaces are normal. There is no acute or chronic infarction. There  is hypoattenuation of the periventricular white matter, most commonly indicating chronic ischemic microangiopathy. Skull: Unchanged appearance of left scalp mass Sinuses/Orbits: No fluid levels or advanced mucosal thickening of the visualized paranasal sinuses. No mastoid or middle ear effusion. The orbits are normal. CTA  NECK FINDINGS SKELETON: There is no bony spinal canal stenosis. No lytic or blastic lesion. OTHER NECK: Normal pharynx, larynx and major salivary glands. No cervical lymphadenopathy. Unremarkable thyroid gland. UPPER CHEST: Small pleural effusions AORTIC ARCH: There is calcific atherosclerosis of the aortic arch. There is no aneurysm, dissection or hemodynamically significant stenosis of the visualized portion of the aorta. Conventional 3 vessel aortic branching pattern. The visualized proximal subclavian arteries are widely patent. RIGHT CAROTID SYSTEM: No dissection, occlusion or aneurysm. There is mixed density atherosclerosis extending into the proximal ICA, resulting in less than 50% stenosis. LEFT CAROTID SYSTEM: Normal without aneurysm, dissection or stenosis. VERTEBRAL ARTERIES: Left dominant configuration. Both origins are clearly patent. There is no dissection, occlusion or flow-limiting stenosis to the skull base (V1-V3 segments). CTA HEAD FINDINGS POSTERIOR CIRCULATION: --Vertebral arteries: Normal V4 segments. --Inferior cerebellar arteries: Normal. --Basilar artery: Normal. --Superior cerebellar arteries: Normal. --Posterior cerebral arteries (PCA): Normal. ANTERIOR CIRCULATION: --Intracranial internal carotid arteries: Normal. --Anterior cerebral arteries (ACA): Normal. Both A1 segments are present. Patent anterior communicating artery (a-comm). --Middle cerebral arteries (MCA): Normal. VENOUS SINUSES: As permitted by contrast timing, patent. ANATOMIC VARIANTS: None Review of the MIP images confirms the above findings. IMPRESSION: 1. No emergent large vessel occlusion or hemodynamically significant stenosis of the head or neck. 2. Unchanged appearance of left scalp mass. 3. Small pleural effusions. 4.  Aortic atherosclerosis (ICD10-I70.0). Electronically Signed   By: Ulyses Jarred M.D.   On: 08/20/2022 21:34   EEG adult  Result Date: 08/20/2022 Lora Havens, MD     08/20/2022  7:27 PM Patient  Name: EWEN GLANDER MRN: [id number] Epilepsy Attending: Lora Havens Referring Physician/Provider: Lorenza Chick, MD Date: 08/20/2022 Duration: 23.31 mins Patient history: 57yo M with ams. EEG to evaluate for seizure Level of alertness: Awake AEDs during EEG study: CBZ, LEV Technical aspects: This EEG study was done with scalp electrodes positioned according to the 10-20 International system of electrode placement. Electrical activity was reviewed with band pass filter of 1-'70Hz'$ , sensitivity of 7 uV/mm, display speed of 18m/sec with a '60Hz'$  notched filter applied as appropriate. EEG data were recorded continuously and digitally stored.  Video monitoring was available and reviewed as appropriate. Description: The posterior dominant rhythm consists of 8 Hz activity of moderate voltage (25-35 uV) seen predominantly in posterior head regions, symmetric and reactive to eye opening and eye closing. Hyperventilation and photic stimulation were not performed.   IMPRESSION: This study is within normal limits. No seizures or epileptiform discharges were seen throughout the recording. PLora Havens  ECHOCARDIOGRAM COMPLETE  Result Date: 08/20/2022    ECHOCARDIOGRAM REPORT   Patient Name:   RAVONTE DIELEMANDate of Exam: 08/20/2022 Medical Rec #:  [id number]      Height:       71.0 in Accession #:    [id number]     Weight:       223.3 lb Date of Birth:  [birth year]       BSA:          2.210 m Patient Age:    89years        BP:           132/69 mmHg  Patient Gender: M               HR:           71 bpm. Exam Location:  Inpatient Procedure: 2D Echo Indications:    Stroke  History:        Patient has prior history of Echocardiogram examinations. CAD,                 Prior CABG; Risk Factors:Hypertension, Diabetes and                 Dyslipidemia.  Sonographer:    Ronny Flurry Referring Phys: [id number] [place]  1. Left ventricular ejection fraction, by estimation, is 60 to 65%. The left ventricle  has normal function. The left ventricle has no regional wall motion abnormalities. There is mild concentric left ventricular hypertrophy. Left ventricular diastolic parameters are consistent with Grade I diastolic dysfunction (impaired relaxation).  2. Right ventricular systolic function is normal. The right ventricular size is not well visualized.  3. Left atrial size was mildly dilated.  4. The mitral valve is abnormal. Trivial mitral valve regurgitation.  5. The aortic valve is tricuspid. There is mild calcification of the aortic valve. There is mild thickening of the aortic valve. Aortic valve regurgitation is not visualized. Aortic valve sclerosis/calcification is present, without any evidence of aortic stenosis.  6. Aortic dilatation noted. There is borderline dilatation of the aortic root, measuring 37 mm. Conclusion(s)/Recommendation(s): No intracardiac source of embolism detected on this transthoracic study. Consider a transesophageal echocardiogram to exclude cardiac source of embolism if clinically indicated. FINDINGS  Left Ventricle: Left ventricular ejection fraction, by estimation, is 60 to 65%. The left ventricle has normal function. The left ventricle has no regional wall motion abnormalities. The left ventricular internal cavity size was normal in size. There is  mild concentric left ventricular hypertrophy. Left ventricular diastolic parameters are consistent with Grade I diastolic dysfunction (impaired relaxation). Right Ventricle: The right ventricular size is not well visualized. Right vetricular wall thickness was not well visualized. Right ventricular systolic function is normal. Left Atrium: Left atrial size was mildly dilated. Right Atrium: Right atrial size was not well visualized. Pericardium: There is no evidence of pericardial effusion. Mitral Valve: The mitral valve is abnormal. There is mild thickening of the mitral valve leaflet(s). There is mild calcification of the mitral valve  leaflet(s). Mild to moderate mitral annular calcification. Trivial mitral valve regurgitation. Tricuspid Valve: The tricuspid valve is normal in structure. Tricuspid valve regurgitation is mild. Aortic Valve: The aortic valve is tricuspid. There is mild calcification of the aortic valve. There is mild thickening of the aortic valve. Aortic valve regurgitation is not visualized. Aortic valve sclerosis/calcification is present, without any evidence of aortic stenosis. Aortic valve mean gradient measures 3.5 mmHg. Aortic valve peak gradient measures 5.9 mmHg. Aortic valve area, by VTI measures 3.22 cm. Pulmonic Valve: The pulmonic valve was normal in structure. Pulmonic valve regurgitation is trivial. Aorta: Aortic dilatation noted. There is borderline dilatation of the aortic root, measuring 37 mm. IAS/Shunts: The atrial septum is grossly normal.  LEFT VENTRICLE PLAX 2D LVIDd:         3.70 cm   Diastology LVIDs:         2.30 cm   LV e' medial:    12.00 cm/s LV PW:         1.50 cm   LV E/e' medial:  9.3 LV IVS:        1.60  cm   LV e' lateral:   9.90 cm/s LVOT diam:     2.20 cm   LV E/e' lateral: 11.3 LV SV:         71 LV SV Index:   32 LVOT Area:     3.80 cm  RIGHT VENTRICLE RV S prime:     9.73 cm/s LEFT ATRIUM           Index LA diam:      3.90 cm 1.76 cm/m LA Vol (A2C): 38.8 ml 17.56 ml/m LA Vol (A4C): 41.1 ml 18.60 ml/m  AORTIC VALVE AV Area (Vmax):    3.43 cm AV Area (Vmean):   3.28 cm AV Area (VTI):     3.22 cm AV Vmax:           121.50 cm/s AV Vmean:          83.950 cm/s AV VTI:            0.222 m AV Peak Grad:      5.9 mmHg AV Mean Grad:      3.5 mmHg LVOT Vmax:         109.67 cm/s LVOT Vmean:        72.467 cm/s LVOT VTI:          0.188 m LVOT/AV VTI ratio: 0.85  AORTA Ao Root diam: 3.70 cm Ao Asc diam:  3.50 cm MITRAL VALVE                TRICUSPID VALVE MV Area (PHT): 3.53 cm     TR Peak grad:   9.5 mmHg MV Decel Time: 215 msec     TR Vmax:        154.00 cm/s MV E velocity: 112.00 cm/s MV A velocity:  116.00 cm/s  SHUNTS MV E/A ratio:  0.97         Systemic VTI:  0.19 m                             Systemic Diam: 2.20 cm Gwyndolyn Kaufman MD Electronically signed by Gwyndolyn Kaufman MD Signature Date/Time: 08/20/2022/4:00:35 PM    Final        Estill Cotta M.D. [facility] 08/22/2022, 10:23 AM  Available via Epic secure chat 7am-7pm After 7 pm, please refer to night coverage provider listed on amion.

## 2022-08-22 NOTE — Progress Notes (Addendum)
STROKE TEAM PROGRESS NOTE   INTERVAL HISTORY His wife  is at the bedside.  He is sitting in the bed in NAD  No new neurological events noted. Patient states he feels pretty good.    Vitals:   08/21/22 2302 08/22/22 0423 08/22/22 0706 08/22/22 0828  BP: (!) 161/74 (!) 167/81    Pulse: 83 86    Resp:    18  Temp: 98.3 F (36.8 C) 98.4 F (36.9 C)  98.1 F (36.7 C)  TempSrc: Oral Oral  Oral  SpO2: 95% 92%    Weight:   109 kg   Height:       CBC:  Recent Labs  Lab 08/16/22 0035 08/16/22 0241 08/17/22 0244 08/18/22 0215  WBC 26.5*   < > 20.5* 11.7*  NEUTROABS 21.7*  --   --   --   HGB 13.9  14.6   < > 14.4 12.9*  HCT 44.9  43.0   < > 39.7 37.3*  MCV 102.3*   < > 86.5 89.9  PLT 397   < > 207 160   < > = values in this interval not displayed.    Basic Metabolic Panel:  Recent Labs  Lab 08/16/22 0455 08/16/22 0638 08/21/22 0119 08/22/22 0116  NA 129*   < > 138 139  K 5.6*   < > 3.8 3.6  CL 92*   < > 103 104  CO2 8*   < > 25 23  GLUCOSE 937*   < > 117* 70  BUN 39*   < > 9 6  CREATININE 3.06*   < > 0.72 0.66  CALCIUM 9.5   < > 8.2* 8.5*  MG 2.6*  --   --   --    < > = values in this interval not displayed.    Lipid Panel:  Recent Labs  Lab 08/21/22 0959  CHOL 95  TRIG 157*  HDL 39*  CHOLHDL 2.4  VLDL 31  LDLCALC 25    HgbA1c:  Recent Labs  Lab 08/16/22 0455  HGBA1C 7.4*    Urine Drug Screen:  Recent Labs  Lab 08/16/22 0558  LABOPIA NONE DETECTED  COCAINSCRNUR NONE DETECTED  LABBENZ NONE DETECTED  AMPHETMU NONE DETECTED  THCU NONE DETECTED  LABBARB NONE DETECTED     Alcohol Level  Recent Labs  Lab 08/16/22 0035  ETH <10     IMAGING past 24 hours No results found.  PHYSICAL EXAM  Temp:  [98.1 F (36.7 C)-98.5 F (36.9 C)] 98.1 F (36.7 C) (02/25 0828) Pulse Rate:  [83-89] 86 (02/25 0423) Resp:  [18] 18 (02/25 0828) BP: (142-167)/(68-81) 167/81 (02/25 0423) SpO2:  [92 %-95 %] 92 % (02/25 0423) Weight:  [id number] kg] 109 kg  (02/25 0706)  General - Well nourished, well developed, in no apparent distress. Cardiovascular - Regular rhythm and rate.  Skin: Multiple neurofibromas noted in his face chest arms and back.  Mental Status -  Level of arousal and orientation to time, place, and person were intact. Language including expression, naming, repetition, comprehension was assessed and found intact. Attention span and concentration were normal. Recent and remote memory were intact. Fund of Knowledge was assessed and was intact.  Cranial Nerves II - XII - II - Visual field intact OU. III, IV, VI - disconjugate gaze V - Facial sensation intact bilaterally. VII - Facial movement intact bilaterally. VIII - Hearing & vestibular intact bilaterally. X - Palate elevates symmetrically. XI - Chin turning &  shoulder shrug intact bilaterally. XII - Tongue protrusion intact.  Motor Strength - The patient's strength was normal in all extremities and pronator drift was absent.  Bulk was normal and fasciculations were absent.   Motor Tone - Muscle tone was assessed at the neck and appendages and was normal.  Sensory - Light touch, temperature/pinprick were assessed and were symmetrical.    Coordination - The patient had normal movements in the hands and feet with no ataxia or dysmetria.  Tremor was absent.  Gait and Station - deferred.  ASSESSMENT/PLAN [name] is a 57 y.o. male with history of  type 1 diabetes on insulin pump at home, coronary artery disease with previous CABG and stenting, hypertension, hyperlipidemia, neurofibromatosis type I and seizure disorder who presented to the emergency room on 2/19 with altered mental status.  He was not feeling well for the last 24 hours.  He was found down in the bathroom. Was found to be in DKA with BG in the 600's and insulin pump not working   Stroke: Acute/subacute multiple bilateral ischemic infarcts  Etiology:  unclear, likely central embolic source  CT  head No acute abnormality.  CTA head & neck NO LVO  MRI  Multiple punctate foci of abnormal diffusion restriction scattered within both cerebral hemispheres, chronic microvascular ischemia and volume loss.  Dopplers bilateral LE- ordered TCD with bubble ordered. Will need TEE, request sent  2D Echo Ef 60-65%, mild concentric left ventricular Hypertrophy, Left ventricular diastolic  parameters are consistent with Grade I diastolic dysfunction  Hypercoag panel pending  LDL 25 HgbA1c 7.4 VTE prophylaxis - heparin SQ    Diet   Diet heart healthy/carb modified Room service appropriate? Yes; Fluid consistency: Thin   aspirin 81 mg daily prior to admission, now on aspirin 81 mg daily and clopidogrel 75 mg daily for 3 weeks than ASA alone .  Therapy recommendations:  pending Disposition:  pending  Seizure history  On Keppra and tegretol at home and continued in hospital  Follows with [facility]  EEG normal   Hypertension Home meds:  imdur, lisinopril, metoprolol Stable Permissive hypertension (OK if < 220/120) but gradually normalize in 5-7 days Long-term BP goal normotensive  Hyperlipidemia Home meds:  crestor and zetia and praluent, resumed in hospital LDL 25, goal < 70 Continue statin at discharge  Diabetes type II UnControlled Home meds:  insulin HgbA1c 7.4, goal < 7.0 Will need close follow up with PCP CBGs Recent Labs    08/21/22 1620 08/21/22 2117 08/22/22 0611  GLUCAP 132* 94 79     SSI  Other Stroke Risk Factors Obesity, Body mass index is 33.52 kg/m., BMI >/= 30 associated with increased stroke risk, recommend weight loss, diet and exercise as appropriate  Coronary artery disease   Hospital day # 6  Jeff Gandy DNP, ACNPC-AG  [facility]  ATTENDING ATTESTATION:  Patient with multifocal punctate strokes concerning for cardioembolic source.  His exam is stable and unchanged from yesterday.  Ongoing hypercoagulable workup as well as cardioembolic  workup pending.  TEE likely Monday.  Consider TCD.  DAPT therapy for now continue to monitor for atrial fibrillation.  Plan discussed with Dr. Tana Coast.  Dr. Reeves Forth evaluated pt independently, reviewed imaging, chart, labs. Discussed and formulated plan with the Resident/APP. Changes were made to the note where appropriate. Please see APP/resident note above for details.   Sherica Paternostro,MD   To contact Stroke Continuity provider, please refer to [email or web address] After hours, contact General Neurology

## 2022-08-23 ENCOUNTER — Other Ambulatory Visit (HOSPITAL_COMMUNITY): Payer: Medicare PPO

## 2022-08-23 ENCOUNTER — Inpatient Hospital Stay (HOSPITAL_COMMUNITY): Payer: Medicare PPO

## 2022-08-23 DIAGNOSIS — E1065 Type 1 diabetes mellitus with hyperglycemia: Secondary | ICD-10-CM | POA: Diagnosis not present

## 2022-08-23 DIAGNOSIS — E875 Hyperkalemia: Secondary | ICD-10-CM | POA: Diagnosis not present

## 2022-08-23 DIAGNOSIS — E1010 Type 1 diabetes mellitus with ketoacidosis without coma: Secondary | ICD-10-CM | POA: Diagnosis not present

## 2022-08-23 DIAGNOSIS — R410 Disorientation, unspecified: Secondary | ICD-10-CM | POA: Diagnosis not present

## 2022-08-23 DIAGNOSIS — I639 Cerebral infarction, unspecified: Secondary | ICD-10-CM | POA: Diagnosis not present

## 2022-08-23 LAB — GLUCOSE, CAPILLARY
Glucose-Capillary: 148 mg/dL — ABNORMAL HIGH (ref 70–99)
Glucose-Capillary: 268 mg/dL — ABNORMAL HIGH (ref 70–99)

## 2022-08-23 LAB — PROTEIN S, TOTAL: Protein S Ag, Total: 85 % (ref 60–150)

## 2022-08-23 LAB — LUPUS ANTICOAGULANT PANEL
DRVVT: 40.1 s (ref 0.0–47.0)
PTT Lupus Anticoagulant: 42.6 s (ref 0.0–43.5)

## 2022-08-23 LAB — PROTEIN C ACTIVITY: Protein C Activity: 95 % (ref 73–180)

## 2022-08-23 LAB — PROTEIN S ACTIVITY: Protein S Activity: 83 % (ref 63–140)

## 2022-08-23 MED ORDER — CLOPIDOGREL BISULFATE 75 MG PO TABS: 75.0000 mg | ORAL_TABLET | Freq: Every day | ORAL | 2 refills | Status: DC

## 2022-08-23 MED ORDER — INSULIN ASPART 100 UNIT/ML FLEXPEN: 0.0000 [IU] | PEN_INJECTOR | Freq: Three times a day (TID) | SUBCUTANEOUS | 11 refills | Status: DC

## 2022-08-23 MED ORDER — ASPIRIN 81 MG PO TABS: 81.0000 mg | ORAL_TABLET | Freq: Every day | ORAL | 0 refills | Status: AC

## 2022-08-23 MED ORDER — "PEN NEEDLES 3/16"" 31G X 5 MM MISC": 0.0000 [IU] | Freq: Three times a day (TID) | 2 refills | Status: DC

## 2022-08-23 MED ORDER — INSULIN ASPART 100 UNIT/ML FLEXPEN: 6.0000 [IU] | PEN_INJECTOR | Freq: Three times a day (TID) | SUBCUTANEOUS | 11 refills | Status: DC

## 2022-08-23 MED ORDER — INSULIN GLARGINE 100 UNIT/ML SOLOSTAR PEN: 50.0000 [IU] | PEN_INJECTOR | Freq: Every day | SUBCUTANEOUS | 11 refills | Status: DC

## 2022-08-23 MED ORDER — TAMSULOSIN HCL 0.4 MG PO CAPS: 0.4000 mg | ORAL_CAPSULE | Freq: Every day | ORAL | 3 refills | Status: AC

## 2022-08-23 NOTE — Discharge Summary (Signed)
Physician Discharge Summary   Patient: Jeff [name] MRN: [id number] DOB: 08/05/1965  Admit date:     08/16/2022  Discharge date: 08/23/22  Discharge Physician: Estill Cotta, MD    PCP: Crist Infante, MD   Recommendations at discharge:   Continue aspirin 81 mg daily and Plavix 75 mg daily for 21 days then continue Plavix indefinitely Outpatient ambulatory referral to neurology sent Patient recommended to keep the insulin pump off and prescription sent for NovoLog and Lantus FlexPen's.  He needs to follow-up with endocrinology before he can resume the insulin pump  Discharge Diagnoses:    DKA (diabetic ketoacidosis) ([place])   AKI (acute kidney injury) ([place])   Hyperkalemia   Encephalopathy acute   Uncontrolled type 1 diabetes mellitus with hyperglycemia ([place])   Acute metabolic encephalopathy   Acute arterial ischemic stroke, multifocal, mult vascular territories ([place])   Severe sepsis present on admission, unknown primary source.  History of seizure disorder Obesity   Hospital Course:   Patient is a 57 year old male with type I DM, on insulin pump at home, CAD with previous CABG and PCI, HTN, HLP, neurofibromatosis type I and seizure disorder who presented to the emergency room on 2/19 with altered mental status.  He was not feeling well for the last 24 hours.  He was found down in the bathroom.  In ED, found to be confused, slow to respond to questions, CBGs > 600 Insulin pump was removed on arrival and cannula was found to be kinked.  Due to significant findings he was admitted to intensive care unit and treated for DKA with insulin drip, IV fluids.  Troponins more Jeff 3000.  WBC was 26,000, lactic acid was more Jeff 9. 2/22, patient continues to improve however with intermittent confusion and difficulties with balance, MRI showed multiple areas of punctate infarction. Neurology consulted   Assessment and Plan:   DKA (diabetic ketoacidosis) ([place]) Uncontrolled diabetes mellitus  type 1 due to malfunctioning insulin pump -Currently stabilized, patient was initially treated with insulin drip and IVF, now transitioned to subcu insulin -Hemoglobin A1c 7.4 on 08/16/2022 -Patient will continue FlexPen's until follow-up with his endocrinology office. Placed on Lantus 15 units daily FlexPen, NovoLog 6 units 3 times daily AC FlexPen, correction factor Tolerating diet, CBGs stable      Acute metabolic encephalopathy   Acute arterial ischemic stroke, multifocal, mult vascular territories ([place]) -CT head on 2/19 without acute findings. -MRI brain showed multiple punctate foci of abnormal diffusion restriction scattered within both cerebral hemispheres, consistent with acute to subacute infarct, no hemorrhage or mass effect.  Unchanged left scalp neurofibroma.   -CTA head and neck showed no emergent large vessel occlusion or hemodynamically significant stenosis of head and neck -2D echo showed EF 60 to 65%, G1 DD, borderline dilatation of aortic root measuring 37 mm -EEG normal, no seizures or epileptiform discharges - PT evaluation recommended home health PT OT Lipid panel showed triglycerides 157, LDL 25, HDL 39 -  Neurology recommend DAPT, discussed with Dr. Leonie Man, recommended aspirin 81 mg daily and Plavix for 21 days, then continue Plavix indefinitely -Continue statin     AKI (acute kidney injury) ([place]) -Presented with creatinine of 3.15 likely due to #1 -Resolved, creatinine 0.6 -Patient was started on Flomax   History of CAD, HTN, HLP with troponin elevation -Troponins elevated likely due to demand ischemia from #1 - 2D echo showed EF 60 to 65%, G1 DD, borderline dilatation of aortic root measuring 37 mm - cont metoprolol, Zetia, Imdur,  statin       Hyperkalemia, AKI -Resolved, resume low-dose lisinopril   Seizure disorder with neurofibromatosis type I -Continue Keppra, Tegretol   Severe sepsis present on admission, unknown primary source. -Leukocytosis, severe  lactic acidosis and acute kidney injury possibly related to DKA - Blood cultures showed no growth.   - Urine culture less Jeff 10,000 colonies no significant growth.   - Procalcitonin 2.7. - Completed 3 days of IV Zosyn and discontinued.   Obesity Estimated body mass index is 33.52 kg/m as calculated from the following:   Height as of this encounter: '5\' 11"'$  (1.803 m).   Weight as of this encounter: 109 kg.        Pain control - [employer] Controlled Substance Reporting System database was reviewed. and patient was instructed, not to drive, operate heavy machinery, perform activities at heights, swimming or participation in water activities or provide baby-sitting services while on Pain, Sleep and Anxiety Medications; until their outpatient Physician has advised to do so again. Also recommended to not to take more Jeff prescribed Pain, Sleep and Anxiety Medications.  Consultants: Neurology Procedures performed: MRI brain, CTA head and neck, 2D echo Disposition: Home Diet recommendation:  Discharge Diet Orders (From admission, onward)     Start     Ordered   08/23/22 0000  Diet Carb Modified        08/23/22 1349           Carb modified diet DISCHARGE MEDICATION: Allergies as of 08/23/2022       Reactions   Doxepin Hcl    Other reaction(s): did not work and had some extra nocturia   Venlafaxine    Other reaction(s): Too much sedation   Latex Rash        Medication List     STOP taking these medications    NovoLOG 100 UNIT/ML injection Generic drug: insulin aspart Replaced by: insulin aspart 100 UNIT/ML FlexPen       TAKE these medications    Accu-Chek Guide test strip Generic drug: glucose blood   Accu-Chek Guide w/Device Kit   Accu-Chek Softclix Lancets lancets   acetaminophen 500 MG tablet Commonly known as: TYLENOL Take 1,000 mg by mouth every 6 (six) hours as needed for mild pain.   Advocate Insulin Syringe 31G X 5/16" 1 ML Misc Generic  drug: Insulin Syringe-Needle U-100   aspirin 81 MG tablet Take 1 tablet (81 mg total) by mouth daily for 21 days.   Centrum Silver 50+Men Tabs Take 1 tablet by mouth every morning.   clopidogrel 75 MG tablet Commonly known as: PLAVIX Take 1 tablet (75 mg total) by mouth daily. Start taking on: August 24, 2022   cyanocobalamin 1000 MCG tablet Take 1,000 mcg by mouth daily.   desvenlafaxine 100 MG 24 hr tablet Commonly known as: PRISTIQ Take 100 mg by mouth daily.   docusate sodium 100 MG capsule Commonly known as: COLACE Take 100 mg by mouth at bedtime.   ezetimibe 10 MG tablet Commonly known as: ZETIA Take 10 mg by mouth at bedtime.   insulin aspart 100 UNIT/ML FlexPen Commonly known as: NOVOLOG Inject 6 Units into the skin 3 (three) times daily with meals. Replaces: NovoLOG 100 UNIT/ML injection   insulin aspart 100 UNIT/ML FlexPen Commonly known as: NOVOLOG Inject 0-9 Units into the skin 3 (three) times daily with meals. Sliding scale correction factor: CBG 70 - 120: 0 units CBG 121 - 150: 1 unit,  CBG 151 - 200: 2 units,  CBG 201 - 250: 3 units,  CBG 251 - 300: 5 units,  CBG 301 - 350: 7 units,  CBG 351 - 400: 9 units   CBG > 400: 9 units and notify your MD   insulin glargine 100 UNIT/ML Solostar Pen Commonly known as: LANTUS Inject 50 Units into the skin daily.   isosorbide mononitrate 60 MG 24 hr tablet Commonly known as: IMDUR Take 60 mg by mouth every morning.   levETIRAcetam 750 MG tablet Commonly known as: KEPPRA TAKE 1 TABLET(750 MG) BY MOUTH TWICE DAILY   lisinopril 5 MG tablet Commonly known as: ZESTRIL Take 5 mg by mouth every morning.   metoprolol tartrate 25 MG tablet Commonly known as: LOPRESSOR TAKE 1 TABLET(25 MG) BY MOUTH TWICE DAILY   nitroGLYCERIN 0.4 MG SL tablet Commonly known as: NITROSTAT Place 1 tablet (0.4 mg total) under the tongue every 5 (five) minutes as needed for chest pain.   OXYGEN Place 2 L into the nose at bedtime.    Praluent 150 MG/ML Soaj Generic drug: Alirocumab Inject 150 mg into the skin every 14 (fourteen) days. Every other Tuesday   promethazine 25 MG tablet Commonly known as: PHENERGAN Take 25 mg by mouth every 6 (six) hours as needed for nausea or vomiting.   rosuvastatin 40 MG tablet Commonly known as: CRESTOR Take 40 mg by mouth at bedtime.   tamsulosin 0.4 MG Caps capsule Commonly known as: FLOMAX Take 1 capsule (0.4 mg total) by mouth daily. Start taking on: August 24, 2022   TEGretol-XR 200 MG 12 hr tablet Generic drug: carbamazepine TAKE 3 TABLETS BY MOUTH TWO TIMES A DAY. What changed:  how much to take how to take this when to take this   Vitamin D 50 MCG (2000 UT) Caps Take 2,000 Units by mouth daily.               Durable Medical Equipment  (From admission, onward)           Start     Ordered   08/20/22 1417  For home use only DME Hospital bed  Once       Comments: Therapeutic mattress  Question Answer Comment  Length of Need 12 Months   Patient has (list medical condition): stroke, seizure disorder   The above medical condition requires: Patient requires the ability to reposition frequently   Bed type Semi-electric   Support Surface: Gel Overlay      08/20/22 1421   08/20/22 1416  For home use only DME Walker rolling  Once       Question Answer Comment  Walker: With [place]   Patient needs a walker to treat with the following condition Stroke ([place])      08/20/22 1421   08/20/22 1416  For home use only DME Bedside commode  Once       Question:  Patient needs a bedside commode to treat with the following condition  Answer:  Stroke [facility])   08/20/22 1421            Follow-up Information     [facility] Follow up.   Specialty: [place] Why: [facility] set up up- Contact information: [place] [address] [phone]         [facility] Follow up.   Why: Hospital bed, rolling walker, BSC  arranged-  to be delivered to home Contact information: [phone]        Crist Infante,  MD. Schedule an appointment as soon as possible for a visit in 2 week(s).   Specialty: Internal Medicine Why: for hospital follow-up Contact information: [place] [address] [facility]. Schedule an appointment as soon as possible for a visit in 3 week(s).   Specialty: Neurology Why: for hospital follow-up Contact information: [place] [phone]               Discharge Exam: Danley Danker Weights   08/21/22 0700 08/22/22 0706 08/23/22 0606  Weight: 108.6 kg 109 kg 105.9 kg   S: No acute complaints, cleared by neurology to discharge home.  BP 124/69 (BP Location: Left Arm)   Pulse 80   Temp 98.6 F (37 C) (Oral)   Resp 18   Ht '5\' 11"'$  (1.803 m)   Wt 105.9 kg   SpO2 97%   BMI 32.55 kg/m   Physical Exam General: Alert and oriented x 3, NAD Cardiovascular: S1 S2 clear, RRR.  Respiratory: CTAB, no wheezing, rales or rhonchi Gastrointestinal: Soft, nontender, nondistended, NBS Ext: no pedal edema bilaterally Neuro: no new deficits Psych: Normal affect    Condition at discharge: fair  The results of significant diagnostics from this hospitalization (including imaging, microbiology, ancillary and laboratory) are listed below for reference.   Imaging Studies: CT ANGIO HEAD NECK W WO CM  Result Date: 08/20/2022 CLINICAL DATA:  Altered mental status EXAM: CT ANGIOGRAPHY HEAD AND NECK TECHNIQUE: Multidetector CT imaging of the head and neck was performed using the standard protocol during bolus administration of intravenous contrast. Multiplanar CT image reconstructions and MIPs were obtained to evaluate the vascular anatomy. Carotid stenosis measurements (when applicable) are obtained utilizing NASCET criteria, using the distal internal carotid diameter as  the denominator. RADIATION DOSE REDUCTION: This exam was performed according to the departmental dose-optimization program which includes automated exposure control, adjustment of the mA and/or kV according to patient size and/or use of iterative reconstruction technique. CONTRAST:  17m OMNIPAQUE IOHEXOL 350 MG/ML SOLN COMPARISON:  None Available. FINDINGS: CT HEAD FINDINGS Brain: There is no mass, hemorrhage or extra-axial collection. The size and configuration of the ventricles and extra-axial CSF spaces are normal. There is no acute or chronic infarction. There is hypoattenuation of the periventricular white matter, most commonly indicating chronic ischemic microangiopathy. Skull: Unchanged appearance of left scalp mass Sinuses/Orbits: No fluid levels or advanced mucosal thickening of the visualized paranasal sinuses. No mastoid or middle ear effusion. The orbits are normal. CTA NECK FINDINGS SKELETON: There is no bony spinal canal stenosis. No lytic or blastic lesion. OTHER NECK: Normal pharynx, larynx and major salivary glands. No cervical lymphadenopathy. Unremarkable thyroid gland. UPPER CHEST: Small pleural effusions AORTIC ARCH: There is calcific atherosclerosis of the aortic arch. There is no aneurysm, dissection or hemodynamically significant stenosis of the visualized portion of the aorta. Conventional 3 vessel aortic branching pattern. The visualized proximal subclavian arteries are widely patent. RIGHT CAROTID SYSTEM: No dissection, occlusion or aneurysm. There is mixed density atherosclerosis extending into the proximal ICA, resulting in less Jeff 50% stenosis. LEFT CAROTID SYSTEM: Normal without aneurysm, dissection or stenosis. VERTEBRAL ARTERIES: Left dominant configuration. Both origins are clearly patent. There is no dissection, occlusion or flow-limiting stenosis to the skull base (V1-V3 segments). CTA HEAD FINDINGS POSTERIOR CIRCULATION: --Vertebral arteries: Normal V4 segments. --Inferior  cerebellar arteries: Normal. --Basilar artery: Normal. --Superior cerebellar arteries: Normal. --Posterior cerebral arteries (PCA): Normal.  ANTERIOR CIRCULATION: --Intracranial internal carotid arteries: Normal. --Anterior cerebral arteries (ACA): Normal. Both A1 segments are present. Patent anterior communicating artery (a-comm). --Middle cerebral arteries (MCA): Normal. VENOUS SINUSES: As permitted by contrast timing, patent. ANATOMIC VARIANTS: None Review of the MIP images confirms the above findings. IMPRESSION: 1. No emergent large vessel occlusion or hemodynamically significant stenosis of the head or neck. 2. Unchanged appearance of left scalp mass. 3. Small pleural effusions. 4.  Aortic atherosclerosis (ICD10-I70.0). Electronically Signed   By: Ulyses Jarred M.D.   On: 08/20/2022 21:34   EEG adult  Result Date: 08/20/2022 Lora Havens, MD     08/20/2022  7:27 PM Patient Name: Jeff [name] MRN: [id number] Epilepsy Attending: Lora Havens Referring Physician/Provider: Lorenza Chick, MD Date: 08/20/2022 Duration: 23.31 mins Patient history: 57yo M with ams. EEG to evaluate for seizure Level of alertness: Awake AEDs during EEG study: CBZ, LEV Technical aspects: This EEG study was done with scalp electrodes positioned according to the 10-20 International system of electrode placement. Electrical activity was reviewed with band pass filter of 1-'70Hz'$ , sensitivity of 7 uV/mm, display speed of 45m/sec with a '60Hz'$  notched filter applied as appropriate. EEG data were recorded continuously and digitally stored.  Video monitoring was available and reviewed as appropriate. Description: The posterior dominant rhythm consists of 8 Hz activity of moderate voltage (25-35 uV) seen predominantly in posterior head regions, symmetric and reactive to eye opening and eye closing. Hyperventilation and photic stimulation were not performed.   IMPRESSION: This study is within normal limits. No seizures or  epileptiform discharges were seen throughout the recording. PLora Havens  ECHOCARDIOGRAM COMPLETE  Result Date: 08/20/2022    ECHOCARDIOGRAM REPORT   Patient Name:   Jeff CRAWLDate of Exam: 08/20/2022 Medical Rec #:  [id number]      Height:       71.0 in Accession #:    [id number]     Weight:       223.3 lb Date of Birth:  [birth year]       BSA:          2.210 m Patient Age:    89years        BP:           132/69 mmHg Patient Gender: M               HR:           71 bpm. Exam Location:  Inpatient Procedure: 2D Echo Indications:    Stroke  History:        Patient has prior history of Echocardiogram examinations. CAD,                 Prior CABG; Risk Factors:Hypertension, Diabetes and                 Dyslipidemia.  Sonographer:    SRonny FlurryReferring Phys: [id number] 1. Left ventricular ejection fraction, by estimation, is 60 to 65%. The left ventricle has normal function. The left ventricle has no regional wall motion abnormalities. There is mild concentric left ventricular hypertrophy. Left ventricular diastolic parameters are consistent with Grade I diastolic dysfunction (impaired relaxation).  2. Right ventricular systolic function is normal. The right ventricular size is not well visualized.  3. Left atrial size was mildly dilated.  4. The mitral valve is abnormal. Trivial mitral valve regurgitation.  5. The aortic valve is tricuspid. There is mild  calcification of the aortic valve. There is mild thickening of the aortic valve. Aortic valve regurgitation is not visualized. Aortic valve sclerosis/calcification is present, without any evidence of aortic stenosis.  6. Aortic dilatation noted. There is borderline dilatation of the aortic root, measuring 37 mm. Conclusion(s)/Recommendation(s): No intracardiac source of embolism detected on this transthoracic study. Consider a transesophageal echocardiogram to exclude cardiac source of embolism if clinically indicated.  FINDINGS  Left Ventricle: Left ventricular ejection fraction, by estimation, is 60 to 65%. The left ventricle has normal function. The left ventricle has no regional wall motion abnormalities. The left ventricular internal cavity size was normal in size. There is  mild concentric left ventricular hypertrophy. Left ventricular diastolic parameters are consistent with Grade I diastolic dysfunction (impaired relaxation). Right Ventricle: The right ventricular size is not well visualized. Right vetricular wall thickness was not well visualized. Right ventricular systolic function is normal. Left Atrium: Left atrial size was mildly dilated. Right Atrium: Right atrial size was not well visualized. Pericardium: There is no evidence of pericardial effusion. Mitral Valve: The mitral valve is abnormal. There is mild thickening of the mitral valve leaflet(s). There is mild calcification of the mitral valve leaflet(s). Mild to moderate mitral annular calcification. Trivial mitral valve regurgitation. Tricuspid Valve: The tricuspid valve is normal in structure. Tricuspid valve regurgitation is mild. Aortic Valve: The aortic valve is tricuspid. There is mild calcification of the aortic valve. There is mild thickening of the aortic valve. Aortic valve regurgitation is not visualized. Aortic valve sclerosis/calcification is present, without any evidence of aortic stenosis. Aortic valve mean gradient measures 3.5 mmHg. Aortic valve peak gradient measures 5.9 mmHg. Aortic valve area, by VTI measures 3.22 cm. Pulmonic Valve: The pulmonic valve was normal in structure. Pulmonic valve regurgitation is trivial. Aorta: Aortic dilatation noted. There is borderline dilatation of the aortic root, measuring 37 mm. IAS/Shunts: The atrial septum is grossly normal.  LEFT VENTRICLE PLAX 2D LVIDd:         3.70 cm   Diastology LVIDs:         2.30 cm   LV e' medial:    12.00 cm/s LV PW:         1.50 cm   LV E/e' medial:  9.3 LV IVS:        1.60 cm    LV e' lateral:   9.90 cm/s LVOT diam:     2.20 cm   LV E/e' lateral: 11.3 LV SV:         71 LV SV Index:   32 LVOT Area:     3.80 cm  RIGHT VENTRICLE RV S prime:     9.73 cm/s LEFT ATRIUM           Index LA diam:      3.90 cm 1.76 cm/m LA Vol (A2C): 38.8 ml 17.56 ml/m LA Vol (A4C): 41.1 ml 18.60 ml/m  AORTIC VALVE AV Area (Vmax):    3.43 cm AV Area (Vmean):   3.28 cm AV Area (VTI):     3.22 cm AV Vmax:           121.50 cm/s AV Vmean:          83.950 cm/s AV VTI:            0.222 m AV Peak Grad:      5.9 mmHg AV Mean Grad:      3.5 mmHg LVOT Vmax:         109.67 cm/s LVOT Vmean:  72.467 cm/s LVOT VTI:          0.188 m LVOT/AV VTI ratio: 0.85  AORTA Ao Root diam: 3.70 cm Ao Asc diam:  3.50 cm MITRAL VALVE                TRICUSPID VALVE MV Area (PHT): 3.53 cm     TR Peak grad:   9.5 mmHg MV Decel Time: 215 msec     TR Vmax:        154.00 cm/s MV E velocity: 112.00 cm/s MV A velocity: 116.00 cm/s  SHUNTS MV E/A ratio:  0.97         Systemic VTI:  0.19 m                             Systemic Diam: 2.20 cm Gwyndolyn Kaufman MD Electronically signed by Gwyndolyn Kaufman MD Signature Date/Time: 08/20/2022/4:00:35 PM    Final    MR BRAIN WO CONTRAST  Result Date: 08/19/2022 CLINICAL DATA:  Altered mental status EXAM: MRI HEAD WITHOUT CONTRAST TECHNIQUE: Multiplanar, multiecho pulse sequences of the brain and surrounding structures were obtained without intravenous contrast. COMPARISON:  03/26/2018 FINDINGS: Brain: Multiple punctate foci of abnormal diffusion restriction scattered within both cerebral hemispheres. No acute hemorrhage. No chronic microhemorrhage or siderosis. There is multifocal hyperintense T2-weighted signal within the white matter. Generalized volume loss. The midline structures are normal. Vascular: Normal flow voids. Skull and upper cervical spine: Unchanged remodeling of the left clivus. Left scalp lesion, unchanged, likely neurofibroma. Sinuses/Orbits: Bilateral optic nerve dural  ectasia. Mild ethmoid air cell mucosal thickening. Other: None IMPRESSION: 1. Multiple punctate foci of abnormal diffusion restriction scattered within both cerebral hemispheres, consistent with acute to subacute infarcts. No hemorrhage or mass effect. 2. Findings of chronic microvascular ischemia and volume loss. 3. Unchanged left scalp lesion, likely neurofibroma. Electronically Signed   By: Ulyses Jarred M.D.   On: 08/19/2022 22:35   CT ABDOMEN PELVIS WO CONTRAST  Result Date: 08/16/2022 CLINICAL DATA:  Hyperglycemia.  Abdominal pain. EXAM: CT ABDOMEN AND PELVIS WITHOUT CONTRAST TECHNIQUE: Multidetector CT imaging of the abdomen and pelvis was performed following the standard protocol without IV contrast. RADIATION DOSE REDUCTION: This exam was performed according to the departmental dose-optimization program which includes automated exposure control, adjustment of the mA and/or kV according to patient size and/or use of iterative reconstruction technique. COMPARISON:  11/24/2011. FINDINGS: Lower chest: Three-vessel coronary artery calcifications are noted. Atelectasis are present at the lung bases. Hepatobiliary: No focal liver abnormality is seen. Fatty infiltration of the liver is noted. No gallstones, gallbladder wall thickening, or biliary dilatation. Pancreas: Unremarkable. No pancreatic ductal dilatation or surrounding inflammatory changes. Spleen: Normal in size without focal abnormality. Adrenals/Urinary Tract: The adrenal glands are within normal limits. Nonobstructive renal calculi and vascular calcifications are noted in the kidneys bilaterally. No hydronephrosis. The bladder is within normal limits. Stomach/Bowel: Stomach is within normal limits. Appendix is surgically absent. No evidence of bowel wall thickening, distention, or inflammatory changes. No free air or pneumatosis. Scattered diverticula are present along the colon without evidence of diverticulitis. Vascular/Lymphatic: Aortic  atherosclerosis. No enlarged abdominal or pelvic lymph nodes. Reproductive: Prostate gland is mildly enlarged. Other: No abdominopelvic ascites. Fat containing inguinal hernias are noted bilaterally. Musculoskeletal: Sternotomy wires are noted. No acute osseous abnormality. IMPRESSION: 1. No acute intra-abdominal process. 2. Hepatic steatosis. 3. Bilateral nephrolithiasis. 4. Enlarged prostate gland. 5. Aortic atherosclerosis and coronary artery calcifications. Electronically Signed  By: Brett Fairy M.D.   On: 08/16/2022 04:06   CT HEAD WO CONTRAST (5MM)  Result Date: 08/16/2022 CLINICAL DATA:  Trauma EXAM: CT HEAD WITHOUT CONTRAST CT CERVICAL SPINE WITHOUT CONTRAST TECHNIQUE: Multidetector CT imaging of the head and cervical spine was performed following the standard protocol without intravenous contrast. Multiplanar CT image reconstructions of the cervical spine were also generated. RADIATION DOSE REDUCTION: This exam was performed according to the departmental dose-optimization program which includes automated exposure control, adjustment of the mA and/or kV according to patient size and/or use of iterative reconstruction technique. COMPARISON:  10/12/2009 CT head, no prior CT cervical spine, correlation is made with MRI head 03/26/2018 FINDINGS: CT HEAD FINDINGS Brain: No evidence of acute infarct, hemorrhage, mass, mass effect, or midline shift. No hydrocephalus or extra-axial fluid collection. Vascular: No hyperdense vessel. Skull: Negative for fracture. Remodeling in the left clivus, including the posterior aspect of the carotid canal, better evaluated on the prior MRI and consistent with a neurofibroma. 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 Sinuses/Orbits: No acute finding. Right scleral band. Status post bilateral lens replacements.  Other: The mastoid air cells are well aerated. CT CERVICAL SPINE FINDINGS Alignment: No listhesis. Skull base and vertebrae: No acute fracture. No primary bone lesion or focal pathologic process. Soft tissues and spinal canal: No prevertebral fluid or swelling. No visible canal hematoma. Disc levels: Degenerative changes, with disc height loss most prominently at C6-C7. Upper chest: Negative. Other: None. IMPRESSION: 1. No acute intracranial process. 2. No acute fracture or traumatic listhesis in the cervical spine. Electronically Signed   By: Merilyn Baba M.D.   On: 08/16/2022 01:54   CT CERVICAL SPINE WO CONTRAST  Result Date: 08/16/2022 CLINICAL DATA:  Trauma EXAM: CT HEAD WITHOUT CONTRAST CT CERVICAL SPINE WITHOUT CONTRAST TECHNIQUE: Multidetector CT imaging of the head and cervical spine was performed following the standard protocol without intravenous contrast. Multiplanar CT image reconstructions of the cervical spine were also generated. RADIATION DOSE REDUCTION: This exam was performed according to the departmental dose-optimization program which includes automated exposure control, adjustment of the mA and/or kV according to patient size and/or use of iterative reconstruction technique. COMPARISON:  10/12/2009 CT head, no prior CT cervical spine, correlation is made with MRI head 03/26/2018 FINDINGS: CT HEAD FINDINGS Brain: No evidence of acute infarct, hemorrhage, mass, mass effect, or midline shift. No hydrocephalus or extra-axial fluid collection. Vascular: No hyperdense vessel. Skull: Negative for fracture. Remodeling in the left clivus, including the posterior aspect of the carotid canal, better evaluated on the prior MRI and consistent with a neurofibroma. 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 Sinuses/Orbits: No acute finding.  Right scleral band. Status post bilateral lens replacements. Other: The mastoid air cells are well aerated. CT CERVICAL SPINE FINDINGS Alignment: No listhesis. Skull base and vertebrae: No acute fracture. No primary bone lesion or focal pathologic process. Soft tissues and spinal canal: No prevertebral fluid or swelling. No visible canal hematoma. Disc levels: Degenerative changes, with disc height loss most prominently at C6-C7. Upper chest: Negative. Other: None. IMPRESSION: 1. No acute intracranial process. 2. No acute fracture or traumatic listhesis in the cervical spine. Electronically Signed  By: Merilyn Baba M.D.   On: 08/16/2022 01:54   DG Chest Port 1 View  Result Date: 08/16/2022 CLINICAL DATA:  Shortness of breath. EXAM: PORTABLE CHEST 1 VIEW COMPARISON:  Chest radiograph dated 05/26/2016. FINDINGS: No focal consolidation, pleural effusion, or pneumothorax. Mild cardiomegaly. Median sternotomy wires and CABG vascular clips. No acute osseous pathology. IMPRESSION: 1. No active disease. 2. Mild cardiomegaly. Electronically Signed   By: Anner Crete M.D.   On: 08/16/2022 01:10    Microbiology: Results for orders placed or performed during the hospital encounter of 08/16/22  Culture, blood (single)     Status: None   Collection Time: 08/16/22  2:27 AM   Specimen: BLOOD  Result Value Ref Range Status   Specimen Description BLOOD LEFT ANTECUBITAL  Final   Special Requests   Final    BOTTLES DRAWN AEROBIC AND ANAEROBIC Blood Culture adequate volume   Culture   Final    NO GROWTH 5 DAYS Performed at [facility], [address], [place], [place] [address]    Report Status 08/21/2022 FINAL  Final  MRSA Next Gen by PCR, Nasal     Status: None   Collection Time: 08/16/22  4:02 AM   Specimen: Nasal Mucosa; Nasal Swab  Result Value Ref Range Status   MRSA by PCR Next Gen NOT DETECTED NOT DETECTED Final    Comment: (NOTE) The GeneXpert MRSA Assay (FDA approved for NASAL specimens  only), is one component of a comprehensive MRSA colonization surveillance program. It is not intended to diagnose MRSA infection nor to guide or monitor treatment for MRSA infections. Test performance is not FDA approved in patients less Jeff 59 years old. Performed at [facility], [place] [address] [place], [place] [address]   Culture, blood (Routine X 2) w Reflex to ID Panel     Status: None   Collection Time: 08/16/22  4:55 AM   Specimen: BLOOD  Result Value Ref Range Status   Specimen Description BLOOD BLOOD LEFT HAND  Final   Special Requests   Final    BOTTLES DRAWN AEROBIC AND ANAEROBIC Blood Culture adequate volume   Culture   Final    NO GROWTH 5 DAYS Performed at [facility], [place] [address] [place], [place] [address]    Report Status 08/21/2022 FINAL  Final  Urine Culture (for pregnant, neutropenic or urologic patients or patients with an indwelling urinary catheter)     Status: Abnormal   Collection Time: 08/16/22  5:58 AM   Specimen: Urine, Clean Catch  Result Value Ref Range Status   Specimen Description URINE, CLEAN CATCH  Final   Special Requests NONE  Final   Culture (A)  Final    <10,000 COLONIES/mL INSIGNIFICANT GROWTH Performed at [facility], [place] [address], [place], [place] [address]    Report Status 08/17/2022 FINAL  Final    Labs: CBC: Recent Labs  Lab 08/17/22 0244 08/18/22 0215  WBC 20.5* 11.7*  HGB 14.4 12.9*  HCT 39.7 37.3*  MCV 86.5 89.9  PLT 207 0000000   Basic Metabolic Panel: Recent Labs  Lab 08/18/22 0215 08/19/22 0128 08/20/22 0151 08/21/22 0119 08/22/22 0116  NA 135 134* 139 138 139  K 3.8 3.5 3.7 3.8 3.6  CL 100 101 103 103 104  CO2 '25 25 28 25 23  '$ GLUCOSE 270* 267* 94 117* 70  BUN '17 13 11 9 6  '$ CREATININE 0.92 0.81 0.76 0.72 0.66  CALCIUM 8.5*  8.0* 8.4* 8.2* 8.5*   Liver Function Tests: No results for input(s): "AST", "ALT", "ALKPHOS", "BILITOT", "PROT", "ALBUMIN" in the last 168 hours. CBG: Recent  Labs  Lab 08/22/22 1225 08/22/22 1659 08/22/22 2110 08/23/22 0602 08/23/22 1132  GLUCAP 254* 199* 190* 148* 268*    Discharge time spent: greater Jeff 30 minutes.  Signed: Estill Cotta, MD [facility] 08/23/2022

## 2022-08-23 NOTE — TOC Transition Note (Signed)
Transition of Care (TOC) - CM/SW Discharge Note Marvetta Gibbons RN, BSN Transitions of Care Unit 4E- RN Case Manager See Treatment Team for direct phone #   Patient Details  Name: Jeff [name] MRN: [id number] Date of Birth: Jul 04, 1965  Transition of Care [facility]) CM/SW Contact:  Dawayne Patricia, RN Phone Number: 08/23/2022, 2:58 PM   Clinical Narrative:    Pt has been cleared for transition home today, wife at bedside and is agreeable to take pt home after speaking with providers.  Recommendations by PT/OT have been updated to Outpt therapies. CM spoke with pt and wife and confirmed they want to do outpt therapy instead of HH- wife to transport.- request referral to [facility] rehab.  Call made to [facility] w/ [facility] to cancel [facility] referral as pt will be going to outpt at this time.   Referral made per verbal order to [facility] Neuro rehab for PT/OT. Info placed on AVS.   DME pending delivery by [employer]- for Bed, BSC, and RW. CM had spoken with liaison this am to request delivery today, wife reports she has spoken with them but needs to go home to move some things to make room for bed- she is to call them back when she is ready to schedule.   No further TOC needs noted. Wife to transport home.    Final next level of care: OP Rehab Barriers to Discharge: Barriers Resolved   Patient Goals and CMS Choice CMS Medicare.gov Compare Post Acute Care list provided to:: Patient Represenative (must comment) (wife) Choice offered to / list presented to : Spouse  Discharge Placement                 Home         Discharge Plan and Services Additional resources added to the After Visit Summary for     Discharge Planning Services: CM Consult Post Acute Care Choice: Home Health, Durable Medical Equipment          DME Arranged: Walker rolling, Hospital bed, Bedside commode DME Agency: [employer] Date DME Agency Contacted: 08/20/22 Time DME Agency Contacted:  [id number] Representative spoke with at DME Agency: Brenton Grills HH Arranged: NA [place] Agency: NA        Social Determinants of Health ([place]) Interventions SDOH Screenings   Food Insecurity: No Food Insecurity (08/16/2022)  Housing: Low Risk  (08/16/2022)  Transportation Needs: No Transportation Needs (08/16/2022)  Utilities: Not At Risk (08/16/2022)  Tobacco Use: Low Risk  (08/17/2022)     Readmission Risk Interventions    08/23/2022    2:58 PM  Readmission Risk Prevention Plan  Post Dischage Appt Complete  Medication Screening Complete  Transportation Screening Complete

## 2022-08-23 NOTE — Progress Notes (Signed)
Physical Therapy Treatment Patient Details Name: Jeff [name] MRN: [id number] DOB: [birth year] Today's Date: 08/23/2022   History of Present Illness 57 yo male admitted 2/19 after syncope and AMS at home with DKA due to insulin pump kinked. Elevated troponin without STEMI. 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 Ongoing hypercoagulable workup as well as cardioembolic workup pending. TEE likely Monday. PMhx: CABG, HTN, HLD, T2DM, seizure disorder.    PT Comments    Pt received in supine, eager to participate in therapy session and with good participation and tolerance for transfer, gait and stair training. Pt making good progress toward functional mobility goals needing up to minA for stair ascent/descent and min guard for gait but remains reliant on RW for balance, where he did not need assistive device prior to admission. Disposition updated below per progress so pt may have more neuro focused therapies post-discharge, discussed with supervising PT Maija P. Pt/spouse agreeable and his wife states she can provide transportation to [place] appointments. Pt continues to benefit from PT services to progress toward functional mobility goals.    Recommendations for follow up therapy are one component of a multi-disciplinary discharge planning process, led by the attending physician.  Recommendations may be updated based on patient status, additional functional criteria and insurance authorization.  Follow Up Recommendations  Outpatient PT (neuro OP PT)     Assistance Recommended at Discharge Frequent or constant Supervision/Assistance  Patient can return home with the following A little help with walking and/or transfers;A little help with bathing/dressing/bathroom;Assistance with cooking/housework;Direct supervision/assist for medications management;Assist for transportation;Direct  supervision/assist for financial management;Help with stairs or ramp for entrance   Equipment Recommendations  Rolling walker (2 wheels)    Recommendations for Other Services       Precautions / Restrictions Precautions Precautions: Fall Restrictions Weight Bearing Restrictions: No     Mobility  Bed Mobility Overal bed mobility: Needs Assistance Bed Mobility: Supine to Sit     Supine to sit: Supervision, HOB elevated     General bed mobility comments: use of bed features; to L EOB    Transfers Overall transfer level: Needs assistance Equipment used: Rolling walker (2 wheels) Transfers: Sit to/from Stand Sit to Stand: Min guard           General transfer comment: from EOB, cues not to pull up on RW needed; then from RW to chair    Ambulation/Gait Ambulation/Gait assistance: Min guard Gait Distance (Feet): 300 Feet Assistive device: Rolling walker (2 wheels) Gait Pattern/deviations: Step-through pattern, Decreased stride length       General Gait Details: Increased RLE supination, pt states he has high arches and sometimes has used orthotic shoes. Pt has sneakers in room he can wear next session, may trial cane next session to see if it is less restrictive, balance was fair today with RW. RW lowered for better height and pt cued to relax his shoulders.   Stairs Stairs: Yes Stairs assistance: Min assist Stair Management: One rail Left, Step to pattern, Alternating pattern, Forwards Number of Stairs: 10 General stair comments: cues for safe step sequencing, pt with reciprocal pattern ascending and cued to perform step-to descending due to slight increase in instability; no overt LOB or buckling, minA for HHA on side opposite wall rail.   Wheelchair Mobility    Modified Rankin (Stroke Patients Only) Modified Rankin (Stroke Patients Only) Pre-Morbid Rankin Score: No significant disability Modified Rankin:  Moderately severe disability     Balance Overall  balance assessment: Needs assistance Sitting-balance support: No upper extremity supported, Feet supported Sitting balance-Leahy Scale: Fair     Standing balance support: Bilateral upper extremity supported, Reliant on assistive device for balance Standing balance-Leahy Scale: Poor Standing balance comment: light UE reliance on RW                            Cognition Arousal/Alertness: Awake/alert Behavior During Therapy: WFL for tasks assessed/performed Overall Cognitive Status: Impaired/Different from baseline Area of Impairment: Safety/judgement                     Memory: Decreased short-term memory   Safety/Judgement: Decreased awareness of safety   Problem Solving: Requires verbal cues General Comments: Per spouse, his cognition is closer to baseline today, pt still mildly impulsive needs min safety cues but good effort and participation throughout.        Exercises      General Comments General comments (skin integrity, edema, etc.): BP 124/69 (85) supine prior to gait trial and no acute s/sx distress throughout; HR 80's bpm SpO2 WFL on RA throughout      Pertinent Vitals/Pain Pain Assessment Pain Assessment: No/denies pain     PT Goals (current goals can now be found in the care plan section) Acute Rehab PT Goals Patient Stated Goal: return home, watch [employer] football PT Goal Formulation: With patient/family Time For Goal Achievement: 09/02/22 Progress towards PT goals: Progressing toward goals    Frequency    Min 3X/week      PT Plan Discharge plan needs to be updated       AM-PAC PT "6 Clicks" Mobility   Outcome Measure  Help needed turning from your back to your side while in a flat bed without using bedrails?: A Little Help needed moving from lying on your back to sitting on the side of a flat bed without using bedrails?: A Little Help needed moving to and from a bed to a chair (including a wheelchair)?: A Little Help needed  standing up from a chair using your arms (e.g., wheelchair or bedside chair)?: A Little Help needed to walk in hospital room?: A Little Help needed climbing 3-5 steps with a railing? : A Little 6 Click Score: 18    End of Session Equipment Utilized During Treatment: Gait belt Activity Tolerance: Patient tolerated treatment well Patient left: in chair;with call bell/phone within reach;with chair alarm set;with family/visitor present;Other (comment);with nursing/sitter in room (spouse and RN in room) Nurse Communication: Mobility status PT Visit Diagnosis: Other abnormalities of gait and mobility (R26.89);Muscle weakness (generalized) (M62.81)     Time: [id number] PT Time Calculation (min) (ACUTE ONLY): 27 min  Charges:  $Gait Training: 23-37 mins                     Nitika Jackowski P., PTA Acute Rehabilitation Services Secure Chat Preferred 9a-5:30pm Office: [place] 08/23/2022, 12:18 PM

## 2022-08-23 NOTE — Progress Notes (Signed)
STROKE TEAM PROGRESS NOTE   INTERVAL HISTORY His wife  is at the bedside.  He is sitting in the bed in NAD  No new neurological events noted. Patient states he feels pretty good and back to his baseline.  He states he went into diabetic ketoacidosis due to of kink in his insulin pump tube.[name]  MRI scan shows bilateral subcortical mall lacunar infarcts.  Vitals:   08/23/22 0349 08/23/22 0606 08/23/22 0800 08/23/22 1130  BP: (!) 163/88  (!) 170/74 124/69  Pulse: 81  95 80  Resp:   18   Temp: 98.6 F (37 C)  98.6 F (37 C)   TempSrc: Oral  Oral   SpO2: 92%  96% 97%  Weight:  105.9 kg    Height:       CBC:  Recent Labs  Lab 08/17/22 0244 08/18/22 0215  WBC 20.5* 11.7*  HGB 14.4 12.9*  HCT 39.7 37.3*  MCV 86.5 89.9  PLT 207 0000000   Basic Metabolic Panel:  Recent Labs  Lab 08/21/22 0119 08/22/22 0116  NA 138 139  K 3.8 3.6  CL 103 104  CO2 25 23  GLUCOSE 117* 70  BUN 9 6  CREATININE 0.72 0.66  CALCIUM 8.2* 8.5*   Lipid Panel:  Recent Labs  Lab 08/21/22 0959  CHOL 95  TRIG 157*  HDL 39*  CHOLHDL 2.4  VLDL 31  LDLCALC 25   HgbA1c:  No results for input(s): "HGBA1C" in the last 168 hours. Urine Drug Screen:  No results for input(s): "LABOPIA", "COCAINSCRNUR", "LABBENZ", "AMPHETMU", "THCU", "LABBARB" in the last 168 hours.  Alcohol Level  No results for input(s): "ETH" in the last 168 hours.  IMAGING past 24 hours No results found.  PHYSICAL EXAM  Temp:  [98.2 F (36.8 C)-98.7 F (37.1 C)] 98.6 F (37 C) (02/26 0800) Pulse Rate:  [80-95] 80 (02/26 1130) Resp:  [18-20] 18 (02/26 0800) BP: (124-170)/(66-88) 124/69 (02/26 1130) SpO2:  [92 %-97 %] 97 % (02/26 1130) Weight:  [105.9 kg] 105.9 kg (02/26 0606)  General - Well nourished, well developed, in no apparent distress. Cardiovascular - Regular rhythm and rate.  Skin: Multiple neurofibromas noted in his face chest arms and back.  A few scattered caf au lait spots noted.  Mild axillary freckling.   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  Mental Status -  Level of arousal and orientation to time, place, and person were intact. Language including expression, naming, repetition, comprehension was assessed and found intact. Attention span and concentration were normal. Recent and remote memory were intact. Fund of Knowledge was assessed and was intact.  Cranial Nerves II - XII - II - Visual field intact OU. III, IV, VI - disconjugate gaze V - Facial sensation intact bilaterally. VII - Facial movement intact bilaterally. VIII - Hearing & vestibular intact bilaterally. X - Palate elevates symmetrically. XI - Chin turning & shoulder shrug intact bilaterally. XII - Tongue protrusion intact.  Motor Strength - The patient's strength was normal in all extremities and pronator drift was absent.  Bulk was normal and fasciculations were absent.   Motor Tone - Muscle tone was assessed at the neck and appendages and was normal.  Sensory - Light touch, temperature/pinprick were assessed and were symmetrical.    Coordination - The patient had normal movements in the hands and feet with no ataxia or dysmetria.  Tremor was absent.  Gait and Station - deferred.  ASSESSMENT/PLAN Jeff [name] is a 57 y.o. male with history of  type 1 diabetes on insulin pump at home, coronary artery disease with previous CABG and stenting, hypertension, hyperlipidemia, neurofibromatosis type I and seizure disorder who presented to the emergency room on 2/19 with altered mental status.  He was not feeling well for the last 24 hours.  He was found down in the bathroom. 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  CTA  head & neck NO LVO  MRI  Multiple punctate foci of abnormal diffusion restriction scattered within both cerebral hemispheres, chronic microvascular ischemia and volume loss.  Dopplers bilateral LE- ordered TCD with bubble ordered. Will need TEE, request sent  2D Echo Ef 60-65%, mild concentric left ventricular Hypertrophy, Left ventricular diastolic  parameters are consistent with Grade I diastolic dysfunction  Hypercoag panel pending  LDL 25 HgbA1c 7.4 VTE prophylaxis - heparin SQ    Diet   Diet heart healthy/carb modified Room service appropriate? Yes; Fluid consistency: Thin   aspirin 81 mg daily prior to admission, now on aspirin 81 mg daily and clopidogrel 75 mg daily for 3 weeks than Plavix alone .  Therapy recommendations: Outpatient PT OT Disposition: Home Seizure history  On Keppra and tegretol at home and continued in hospital  Follows with [facility]  EEG normal   Hypertension Home meds:  imdur, lisinopril, metoprolol Stable Permissive hypertension (OK if < 220/120) but gradually normalize in 5-7 days Long-term BP goal normotensive  Hyperlipidemia Home meds:  crestor and zetia and praluent, resumed in hospital LDL 25, goal < 70 Continue statin at discharge  Diabetes type II UnControlled Home meds:  insulin HgbA1c 7.4, goal < 7.0 Will need close follow up with PCP CBGs Recent Labs    08/22/22 2110 08/23/22 0602 08/23/22 1132  GLUCAP 190* 148* 268*    SSI  Other Stroke Risk Factors Obesity, Body mass index is 32.55 kg/m., BMI >/= 30 associated with increased stroke risk, recommend weight loss, diet and exercise as appropriate  Coronary artery disease   Hospital day # 7  Patient presented with confusion and altered mental status in the setting of hyperglycemia due to malfunctioning insulin pump.  MRI scan shows by lateral subcortical small infarcts likely lacunar in etiology and not cardioembolic.  Recommend cancel TEE.  Aspirin and Plavix for 3 weeks followed  by Plavix alone and aggressive risk factor modification.  Patient can be discharged home.  Follow-up as outpatient stroke clinic with nurse practitioner in 2 months.  Long discussion with patient and wife and answered questions.  Discussed with Dr. Joya Gaskins.  Greater than 50% time during this 35-minute visit were spent in counseling and coordination of care about his presentation and finding of bilateral lacunar strokes and discussion about management and prevention and answering questions. Antony Contras, MD To contact Stroke Continuity provider, please refer to [email or web address] After hours, contact General Neurology

## 2022-08-23 NOTE — Inpatient Diabetes Management (Signed)
Inpatient Diabetes Program Recommendations  AACE/ADA: New Consensus Statement on Inpatient Glycemic Control (2015)  Target Ranges:  Prepandial:   less than 140 mg/dL      Peak postprandial:   less than 180 mg/dL (1-2 hours)      Critically ill patients:  140 - 180 mg/dL   Lab Results  Component Value Date   GLUCAP 268 (H) 08/23/2022   HGBA1C 7.4 (H) 08/16/2022    Page from Dr. Tana Coast asking to be sure he is aware how to administer correction and meal coverage insulin.  Met with patient and his spouse at bedside.  He has used correction and meal coverage in the past.  He was able to explain how to administer both basal and bolus insulins.  He plans to transition back to his pump in the next few days.    Will continue to follow while inpatient.  Thank you, [name], MSN, [place] Diabetes Coordinator Inpatient Diabetes Program [phone] (team pager from 8a-5p)

## 2022-08-23 NOTE — Progress Notes (Signed)
Occupational Therapy Treatment Patient Details Name: Jeff [name] MRN: [id number] DOB: 1966-02-28 Today's Date: 08/23/2022   History of present illness 57 yo male admitted 2/19 after syncope and AMS at home with DKA due to insulin pump kinked. Elevated troponin without STEMI. 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 Ongoing hypercoagulable workup as well as cardioembolic workup pending. TEE likely Monday. PMhx: CABG, HTN, HLD, T2DM, seizure disorder.   OT comments  Pt has made great progress with all adls meeting 3 of 6 goals. New goals for dressing and grooming set. Appears pt is closer to baseline with cognition demonstrating mild impulsivity and safety awareness. Pt requires at most min guard for basic adls and fatigues quickly.  Vision appears to be at baseline.  Will continue to see pt and focus on home skills and cognition and attempt to get to supervision level with all adls. Pt would be safe to go home if wife can be with him 24/7.  Family prefers OPOT which is perfect for level of pt's function.   Recommendations for follow up therapy are one component of a multi-disciplinary discharge planning process, led by the attending physician.  Recommendations may be updated based on patient status, additional functional criteria and insurance authorization.    Follow Up Recommendations  Outpatient OT     Assistance Recommended at Discharge Frequent or constant Supervision/Assistance  Patient can return home with the following  A little help with walking and/or transfers;A little help with bathing/dressing/bathroom;Assistance with cooking/housework;Direct supervision/assist for medications management;Direct supervision/assist for financial management;Assist for transportation;Help with stairs or ramp for entrance   Equipment Recommendations  BSC/3in1    Recommendations for Other  Services      Precautions / Restrictions Precautions Precautions: Fall Restrictions Weight Bearing Restrictions: No       Mobility Bed Mobility               General bed mobility comments: in chair on arrival    Transfers Overall transfer level: Needs assistance Equipment used: Rolling walker (2 wheels) Transfers: Sit to/from Stand Sit to Stand: Min guard           General transfer comment: from EOB, cues not to pull up on RW needed; then from RW to chair     Balance Overall balance assessment: Needs assistance Sitting-balance support: No upper extremity supported, Feet supported Sitting balance-Leahy Scale: Fair     Standing balance support: Bilateral upper extremity supported, Reliant on assistive device for balance Standing balance-Leahy Scale: Poor Standing balance comment: light UE reliance on RW                           ADL either performed or assessed with clinical judgement   ADL Overall ADL's : Needs assistance/impaired Eating/Feeding: Independent;Sitting   Grooming: Wash/dry hands;Wash/dry face;Oral care;Supervision/safety;Standing Grooming Details (indicate cue type and reason): stood at sink for 5 minutes Upper Body Bathing: Set up;Sitting   Lower Body Bathing: Min guard;Sit to/from stand;Cueing for compensatory techniques Lower Body Bathing Details (indicate cue type and reason): min guard when standing to bathe Upper Body Dressing : Set up;Sitting   Lower Body Dressing: Min guard;Sit to/from stand;Cueing for compensatory techniques Lower Body Dressing Details (indicate cue type and reason): min guard for balance in standing. could perform figure 4 position today without assist. Toilet Transfer: Min guard;Ambulation;Rolling walker (2 wheels);Comfort height toilet;Grab bars Toilet Transfer  Details (indicate cue type and reason): Pt walked to bathroom with min guard. Toileting- Water quality scientist and Hygiene: Min guard;Sit  to/from stand;Cueing for compensatory techniques       Functional mobility during ADLs: Min guard;Rolling walker (2 wheels) General ADL Comments: Pt with much improved adl status. Requiring min guard when standing.    Extremity/Trunk Assessment Upper Extremity Assessment Upper Extremity Assessment: Overall WFL for tasks assessed   Lower Extremity Assessment Lower Extremity Assessment: Defer to PT evaluation        Vision       Perception     Praxis      Cognition Arousal/Alertness: Awake/alert Behavior During Therapy: WFL for tasks assessed/performed Overall Cognitive Status: Impaired/Different from baseline Area of Impairment: Safety/judgement                     Memory: Decreased short-term memory   Safety/Judgement: Decreased awareness of safety   Problem Solving: Requires verbal cues General Comments: Pt with mild impulsivity during adls in room but overall did not lose balance nd was safe with mobility. Pt is quick to move around objects at times but overall safe.        Exercises      Shoulder Instructions       General Comments Pt cognitively and physically improved since eval    Pertinent Vitals/ Pain       Pain Assessment Pain Assessment: No/denies pain  Home Living                                          Prior Functioning/Environment              Frequency  Min 2X/week        Progress Toward Goals  OT Goals(current goals can now be found in the care plan section)  Progress towards OT goals: Progressing toward goals  Acute Rehab OT Goals OT Goal Formulation: With patient Time For Goal Achievement: 09/02/22 Potential to Achieve Goals: Good ADL Goals Pt Will Perform Grooming: with modified independence;standing Pt Will Perform Lower Body Bathing: with min assist;sit to/from stand Pt Will Perform Lower Body Dressing: with min assist;sit to/from stand Pt Will Transfer to Toilet: with  supervision;ambulating;regular height toilet Pt Will Perform Toileting - Clothing Manipulation and hygiene: with supervision;sit to/from stand Additional ADL Goal #1: Pt will gather all clothing and fully dress self with supervision.  Plan Discharge plan needs to be updated    Co-evaluation                 AM-PAC OT "6 Clicks" Daily Activity     Outcome Measure   Help from another person eating meals?: None Help from another person taking care of personal grooming?: None Help from another person toileting, which includes using toliet, bedpan, or urinal?: A Little Help from another person bathing (including washing, rinsing, drying)?: A Little Help from another person to put on and taking off regular upper body clothing?: None Help from another person to put on and taking off regular lower body clothing?: A Little 6 Click Score: 21    End of Session Equipment Utilized During Treatment: Rolling walker (2 wheels)  OT Visit Diagnosis: Unsteadiness on feet (R26.81);Other abnormalities of gait and mobility (R26.89);Other symptoms and signs involving cognitive function;Muscle weakness (generalized) (M62.81)   Activity Tolerance Patient tolerated treatment well   Patient Left in chair;with  call bell/phone within reach;with chair alarm set;with family/visitor present   Nurse Communication Mobility status        Time: [id number] OT Time Calculation (min): 36 min  Charges: OT General Charges $OT Visit: 1 Visit OT Treatments $Self Care/Home Management : 23-37 mins   Glenford Peers 08/23/2022, 1:02 PM

## 2022-08-24 ENCOUNTER — Other Ambulatory Visit: Payer: Self-pay

## 2022-08-24 ENCOUNTER — Telehealth: Payer: Self-pay | Admitting: *Deleted

## 2022-08-24 DIAGNOSIS — E1065 Type 1 diabetes mellitus with hyperglycemia: Secondary | ICD-10-CM

## 2022-08-24 DIAGNOSIS — E1139 Type 2 diabetes mellitus with other diabetic ophthalmic complication: Secondary | ICD-10-CM

## 2022-08-24 NOTE — Progress Notes (Signed)
  Care Coordination   Note   08/24/2022 Name: Jeff [name] MRN: [id number] DOB: 12/08/1965  Jeff [name] is a 57 y.o. year old male who sees Crist Infante, MD for primary care. I reached out to Jeff [name] by phone today to offer care coordination services.  Referral received   [name] was given information about Care Coordination services today including:   The Care Coordination services include support from the care team which includes your Nurse Coordinator, Clinical Social Worker, or Pharmacist.  The Care Coordination team is here to help remove barriers to the health concerns and goals most important to you. Care Coordination services are voluntary, and the patient may decline or stop services at any time by request to their care team member.   Care Coordination Consent Status: Patient agreed to services and verbal consent obtained.   Follow up plan:  Telephone appointment with care coordination team member scheduled for:  08/26/2022  Encounter Outcome:  Pt. Scheduled from referral   Julian Hy, [place] Direct Dial: [phone]

## 2022-08-25 DIAGNOSIS — I10 Essential (primary) hypertension: Secondary | ICD-10-CM | POA: Diagnosis not present

## 2022-08-25 DIAGNOSIS — Z8639 Personal history of other endocrine, nutritional and metabolic disease: Secondary | ICD-10-CM | POA: Diagnosis not present

## 2022-08-25 DIAGNOSIS — R6510 Systemic inflammatory response syndrome (SIRS) of non-infectious origin without acute organ dysfunction: Secondary | ICD-10-CM | POA: Diagnosis not present

## 2022-08-25 DIAGNOSIS — Z794 Long term (current) use of insulin: Secondary | ICD-10-CM | POA: Diagnosis not present

## 2022-08-25 DIAGNOSIS — Z8673 Personal history of transient ischemic attack (TIA), and cerebral infarction without residual deficits: Secondary | ICD-10-CM | POA: Diagnosis not present

## 2022-08-25 DIAGNOSIS — G9341 Metabolic encephalopathy: Secondary | ICD-10-CM | POA: Diagnosis not present

## 2022-08-25 DIAGNOSIS — E1039 Type 1 diabetes mellitus with other diabetic ophthalmic complication: Secondary | ICD-10-CM | POA: Diagnosis not present

## 2022-08-25 DIAGNOSIS — N179 Acute kidney failure, unspecified: Secondary | ICD-10-CM | POA: Diagnosis not present

## 2022-08-25 LAB — HOMOCYSTEINE: Homocysteine: 5.1 umol/L (ref 0.0–14.5)

## 2022-08-25 LAB — PROTEIN C, TOTAL: Protein C, Total: 98 % (ref 60–150)

## 2022-08-26 ENCOUNTER — Ambulatory Visit: Payer: Self-pay

## 2022-08-26 ENCOUNTER — Other Ambulatory Visit: Payer: Self-pay | Admitting: Neurology

## 2022-08-26 DIAGNOSIS — E1039 Type 1 diabetes mellitus with other diabetic ophthalmic complication: Secondary | ICD-10-CM | POA: Diagnosis not present

## 2022-08-26 NOTE — Patient Outreach (Signed)
  Care Coordination   Initial Visit Note   08/26/2022 Name: Jeff [name] MRN: [id number] DOB: 03-27-1966  Jeff [name] is a 57 y.o. year old male who sees Crist Infante, MD for primary care. I spoke with  Jeff [name] by phone today.  What matters to the patients health and wellness today?  Get back to prior functioning    Goals Addressed             This Visit's Progress    Getting back to prior level of functioning-Diabetes Management       Patient with recent hospitalization for DKA due to pump failure( patient reports Kink in cannula. Patient reports he is better.   Saw PCP 08/25/22.  Will see Cathy Miller-CDE in Dr. Silvestre Mesi office next week for insulin pump adjustment and start Dexcom.  Diabetes management good with sugars 98, 123 but this am was 205.  Discussed diabetes management. Patient also works with Loretha Brasil for patient assistance.   Patient doing neuro rehab. Patient to call [employer] about cost of walker ordered during hospitalization.  He did not want the hospital bed ordered during hospitalization. Wife providing transportation to appointments as she has taken a sabbatical from work.    Interventions Today    Flowsheet Row Most Recent Value  Chronic Disease   Chronic disease during today's visit Diabetes  General Interventions   General Interventions Discussed/Reviewed General Interventions Discussed, Doctor Visits  Doctor Visits Discussed/Reviewed Doctor Visits Discussed  Education Interventions   Education Provided Provided Education  Provided Verbal Education On Blood Sugar Monitoring, Nutrition, When to see the doctor  Mental Health Interventions   Mental Health Discussed/Reviewed Mental Health Discussed, Depression  Nutrition Interventions   Nutrition Discussed/Reviewed Nutrition Discussed, Decreasing sugar intake             SDOH assessments and interventions completed:  Yes  SDOH Interventions Today    Flowsheet Row Most Recent  Value  SDOH Interventions   Housing Interventions Intervention Not Indicated  Transportation Interventions Intervention Not Indicated        Care Coordination Interventions:  Yes, provided   Follow up plan: Follow up call scheduled for March    Encounter Outcome:  Pt. Visit Completed   Jone Baseman, RN, MSN [place] [facility] Care Management Coordinator Direct Line [phone]

## 2022-08-26 NOTE — Patient Instructions (Signed)
Visit Information  Thank you for taking time to visit with me today. Please don't hesitate to contact me if I can be of assistance to you.   Following are the goals we discussed today:   Goals Addressed             This Visit's Progress    Getting back to prior level of functioning-Diabetes Management       Patient with recent hospitalization for DKA due to pump failure( patient reports Kink in cannula. Patient reports he is better.   Saw PCP 08/25/22.  Will see Cathy Miller-CDE in Dr. Silvestre Mesi office next week for insulin pump adjustment and start Dexcom.  Diabetes management good with sugars 98, 123 but this am was 205.  Discussed diabetes management. Patient also works with Loretha Brasil for patient assistance.   Patient doing neuro rehab. Patient to call [employer] about cost of walker ordered during hospitalization.  He did not want the hospital bed ordered during hospitalization. Wife providing transportation to appointments as she has taken a sabbatical from work.    Interventions Today    Flowsheet Row Most Recent Value  Chronic Disease   Chronic disease during today's visit Diabetes  General Interventions   General Interventions Discussed/Reviewed General Interventions Discussed, Doctor Visits  Doctor Visits Discussed/Reviewed Doctor Visits Discussed  Education Interventions   Education Provided Provided Education  Provided Verbal Education On Blood Sugar Monitoring, Nutrition, When to see the doctor  Mental Health Interventions   Mental Health Discussed/Reviewed Mental Health Discussed, Depression  Nutrition Interventions   Nutrition Discussed/Reviewed Nutrition Discussed, Decreasing sugar intake             Our next appointment is by telephone on 09/06/22 at 1:00 pm  Please call the care guide team at [phone] if you need to cancel or reschedule your appointment.   If you are experiencing a Mental Health or [place] or need someone to talk to,  please call the Suicide and Crisis Lifeline: 988   Patient verbalizes understanding of instructions and care plan provided today and agrees to view in [place]. Active MyChart status and patient understanding of how to access instructions and care plan via MyChart confirmed with patient.     Telephone follow up appointment with care management team member scheduled for: The patient has been provided with contact information for the care management team and has been advised to call with any health related questions or concerns.   Jone Baseman, RN, MSN [facility] Care Management Coordinator Direct Line [phone]

## 2022-08-31 LAB — PROTHROMBIN GENE MUTATION

## 2022-09-01 DIAGNOSIS — E1039 Type 1 diabetes mellitus with other diabetic ophthalmic complication: Secondary | ICD-10-CM | POA: Diagnosis not present

## 2022-09-01 DIAGNOSIS — I2510 Atherosclerotic heart disease of native coronary artery without angina pectoris: Secondary | ICD-10-CM | POA: Diagnosis not present

## 2022-09-01 DIAGNOSIS — Z4681 Encounter for fitting and adjustment of insulin pump: Secondary | ICD-10-CM | POA: Diagnosis not present

## 2022-09-01 DIAGNOSIS — Z794 Long term (current) use of insulin: Secondary | ICD-10-CM | POA: Diagnosis not present

## 2022-09-01 DIAGNOSIS — I10 Essential (primary) hypertension: Secondary | ICD-10-CM | POA: Diagnosis not present

## 2022-09-03 ENCOUNTER — Other Ambulatory Visit: Payer: Self-pay | Admitting: Neurology

## 2022-09-03 LAB — FACTOR 5 LEIDEN

## 2022-09-06 ENCOUNTER — Ambulatory Visit: Payer: Self-pay

## 2022-09-06 NOTE — Patient Instructions (Signed)
Visit Information  Thank you for taking time to visit with me today. Please don't hesitate to contact me if I can be of assistance to you.   Following are the goals we discussed today:   Goals Addressed             This Visit's Progress    To improve stamina and balance       Care Coordination Interventions: Evaluation of current treatment plan related to impaired physical mobility and patient's adherence to plan as established by provider Determined patient continues to have impaired balance and fatigue following a recent inpatient admission secondary to having DKA resulting in a stroke Review of patient status, including review of consultant's reports, relevant laboratory and other test results Determined patient will begin outpatient PT/OT on 09/09/22 as recommended by the hospital PT  Assessed for SDOH barriers, none identified at this time Assessed for DME needs, patient plans to contact DME vendor to purchase a rolling walker for outside usage when ready Educated patient on importance of HEP adherence        To reduce risk of hyperglycemia       Care Coordination Interventions: Provided education to patient about basic DM disease process Reviewed medications with patient and discussed importance of medication adherence Discussed plans with patient for ongoing care management follow up and provided patient with direct contact information for care management team Provided patient with written educational materials related to hypo and hyperglycemia and importance of correct treatment Advised patient, providing education and rationale, to check cbg daily before meals and at bedtime and record, calling PCP and or diabetic educator  for findings outside established parameters Review of patient status, including review of consultants reports, relevant laboratory and other test results, and medications completed Counseled on Diabetic diet, my plate method           Our next  appointment is by telephone on 10/07/22 at 1:00 PM  Please call the care guide team at [phone] if you need to cancel or reschedule your appointment.   If you are experiencing a Mental Health or [place] or need someone to talk to, please call 1-800-273-TALK (toll free, 24 hour hotline) go to [facility] [address] [place] [phone])  Patient verbalizes understanding of instructions and care plan provided today and agrees to view in [place]. Active MyChart status and patient understanding of how to access instructions and care plan via MyChart confirmed with patient.     Barb Merino, RN, BSN, CCM Care Management Coordinator [facility] Care Management  Direct Phone: [phone]

## 2022-09-06 NOTE — Patient Outreach (Signed)
  Care Coordination   Initial Visit Note   09/06/2022 Name: Jeff [name] MRN: [id number] DOB: 1965/09/17  Jeff [name] is a 57 y.o. year old male who sees Jeff Infante, MD for primary care. I spoke with  Jeff [name] by phone today.  What matters to the patients health and wellness today?  Patient will start outpatient PT on 09/09/22 to work on building stamina and work on balance. Patient will continue to follow dm recommendations for monitoring for hyperglycemic events.     Goals Addressed             This Visit's Progress    To improve stamina and balance       Care Coordination Interventions: Evaluation of current treatment plan related to impaired physical mobility and patient's adherence to plan as established by provider Determined patient continues to have impaired balance and fatigue following a recent inpatient admission secondary to having DKA resulting in a stroke Review of patient status, including review of consultant's reports, relevant laboratory and other test results Determined patient will begin outpatient PT/OT on 09/09/22 as recommended by the hospital PT  Assessed for SDOH barriers, none identified at this time Assessed for DME needs, patient plans to contact DME vendor to purchase a rolling walker for outside usage when ready Educated patient on importance of HEP adherence        To reduce risk of hyperglycemia       Care Coordination Interventions: Provided education to patient about basic DM disease process Reviewed medications with patient and discussed importance of medication adherence Discussed plans with patient for ongoing care management follow up and provided patient with direct contact information for care management team Provided patient with written educational materials related to hypo and hyperglycemia and importance of correct treatment Advised patient, providing education and rationale, to check cbg daily before meals and at bedtime and  record, calling PCP and or diabetic educator  for findings outside established parameters Review of patient status, including review of consultants reports, relevant laboratory and other test results, and medications completed Counseled on Diabetic diet, my plate method       Interventions Today    Flowsheet Row Most Recent Value  Chronic Disease   Chronic disease during today's visit Diabetes, Other  [Stroke]  General Interventions   General Interventions Discussed/Reviewed General Interventions Discussed, General Interventions Reviewed, Doctor Visits  Doctor Visits Discussed/Reviewed Doctor Visits Discussed, Doctor Visits Reviewed, PCP, Specialist          SDOH assessments and interventions completed:  No     Care Coordination Interventions:  Yes, provided   Follow up plan: Follow up call scheduled for 10/07/22 @1 :00 PM    Encounter Outcome:  Pt. Visit Completed

## 2022-09-09 ENCOUNTER — Telehealth: Payer: Self-pay | Admitting: Neurology

## 2022-09-09 ENCOUNTER — Ambulatory Visit: Payer: Medicare PPO | Admitting: Occupational Therapy

## 2022-09-09 ENCOUNTER — Encounter: Payer: Self-pay | Admitting: Physical Therapy

## 2022-09-09 ENCOUNTER — Other Ambulatory Visit: Payer: Self-pay

## 2022-09-09 ENCOUNTER — Ambulatory Visit: Payer: Medicare PPO | Attending: Internal Medicine | Admitting: Physical Therapy

## 2022-09-09 ENCOUNTER — Encounter: Payer: Self-pay | Admitting: Occupational Therapy

## 2022-09-09 DIAGNOSIS — E103593 Type 1 diabetes mellitus with proliferative diabetic retinopathy without macular edema, bilateral: Secondary | ICD-10-CM | POA: Diagnosis not present

## 2022-09-09 DIAGNOSIS — R208 Other disturbances of skin sensation: Secondary | ICD-10-CM

## 2022-09-09 DIAGNOSIS — R41842 Visuospatial deficit: Secondary | ICD-10-CM | POA: Diagnosis not present

## 2022-09-09 DIAGNOSIS — I6389 Other cerebral infarction: Secondary | ICD-10-CM | POA: Insufficient documentation

## 2022-09-09 DIAGNOSIS — M6281 Muscle weakness (generalized): Secondary | ICD-10-CM | POA: Diagnosis not present

## 2022-09-09 DIAGNOSIS — R2681 Unsteadiness on feet: Secondary | ICD-10-CM | POA: Insufficient documentation

## 2022-09-09 DIAGNOSIS — R278 Other lack of coordination: Secondary | ICD-10-CM

## 2022-09-09 DIAGNOSIS — R29818 Other symptoms and signs involving the nervous system: Secondary | ICD-10-CM | POA: Insufficient documentation

## 2022-09-09 DIAGNOSIS — F429 Obsessive-compulsive disorder, unspecified: Secondary | ICD-10-CM | POA: Diagnosis not present

## 2022-09-09 MED ORDER — TEGRETOL-XR 200 MG PO TB12: ORAL_TABLET | ORAL | 3 refills | Status: DC

## 2022-09-09 NOTE — Telephone Encounter (Signed)
Called pt and left a LVM informing pt that he should be able to pick medication up from the pharmacy.

## 2022-09-09 NOTE — Therapy (Addendum)
OUTPATIENT PHYSICAL THERAPY NEURO EVALUATION   Patient Name: Jeff [name] MRN: [id number] DOB:04-11-1966, 57 y.o., male Today's Date: 09/09/2022   PCP: Crist Infante, MD REFERRING PROVIDER: Mendel Corning, MD  END OF SESSION:   09/09/22 1030  PT Visits / Re-Eval  Visit Number 1  Number of Visits 5 (4 + eval)  Date for PT Re-Evaluation 10/15/22 (pushed out due to scheduling)  Authorization  Authorization Type [employer] MEDICARE  PT Time Calculation  PT Start Time 1017 (Handoff from OT)  PT Stop Time 1100  PT Time Calculation (min) 43 min  PT - End of Session  Equipment Utilized During Treatment Gait belt  Behavior During Therapy [facility] for tasks assessed/performed    Past Medical History:  Diagnosis Date   Anxiety    CAD (coronary artery disease)    Cath 2011.  LAD stent patent, distal LAD occlusion, D1 100%, OM branch 80 - 90%, SVG to diag patent, LIMA to LAD occluded, SVG to RCA occluded, SVG to OM was occluded.   No change from previous cath.  Managed medically.    Cataract    Depression    Diabetes mellitus type I ([place])    on insulin pump at  home   Diabetic retinopathy    s/p vitrectomy and history of retinal surgery   Hemorrhoid    History of oxygen administration    nocutural use only at 2 l/m nasally.   HTN (hypertension)    Hyperlipidemia    Myocardial infarction [facility])    Neurofibromatosis    with neurofibroma lesion at the base of the skull   Seasonal allergic rhinitis    Seizure disorder (HCC)    Sleep apnea    s/p oral surgery   Toe fracture, right    11-23-14 fracture right big toe-wearing a stability shoe   Past Surgical History:  Procedure Laterality Date   APPENDECTOMY     CARDIAC CATHETERIZATION     CATARACT EXTRACTION, BILATERAL     COLONOSCOPY WITH PROPOFOL N/A 07/21/2017   Procedure: COLONOSCOPY WITH PROPOFOL;  Surgeon: Milus Banister, MD;  Location: [facility];  Service: Endoscopy;  Laterality: N/A;   CORONARY ANGIOPLASTY     9'11,   CABPG grafts had failed.   CORONARY ARTERY BYPASS GRAFT     4 vessel 4/11   ESOPHAGOGASTRODUODENOSCOPY (EGD) WITH PROPOFOL N/A 12/05/2014   Procedure: ESOPHAGOGASTRODUODENOSCOPY (EGD) WITH PROPOFOL;  Surgeon: Milus Banister, MD;  Location: [facility];  Service: Endoscopy;  Laterality: N/A;   LAPAROSCOPIC APPENDECTOMY  11/24/2011   LEFT HEART CATHETERIZATION WITH CORONARY/GRAFT ANGIOGRAM N/A 02/22/2013   Procedure: LEFT HEART CATHETERIZATION WITH Beatrix Fetters;  Surgeon: Sherren Mocha, MD;  [facility]: [facility] CATH LAB;  Service: Cardiovascular;  Laterality: N/A;   release of right transverse carpal ligament     right eye vitrectomy and detached retina repair  10/2007   TONSILLECTOMY     uvulopalatoplasty surgery     Patient Active Problem List   Diagnosis Date Noted   CVA (cerebral vascular accident) ([place]) 08/23/2022   Confusion 08/23/2022   Acute arterial ischemic stroke, multifocal, mult vascular territories ([place]) 08/20/2022   Encephalopathy acute 08/17/2022   Uncontrolled type 1 diabetes mellitus with hyperglycemia ([place]) 0000000   Acute metabolic encephalopathy 0000000   DKA (diabetic ketoacidosis) ([place]) 08/16/2022   AKI (acute kidney injury) ([place]) 08/16/2022   Hyperkalemia 08/16/2022   Special screening for malignant neoplasms, colon    Diverticulosis of colon without hemorrhage    Toe fracture, right  Sleep apnea    Seizure disorder (HCC)    Seasonal allergic rhinitis    Myocardial infarction ([place])    Hyperlipidemia    History of oxygen administration    Hemorrhoid    Diabetes mellitus type I ([place])    Depression    Cataract    CAD (coronary artery disease)    Anxiety    Odynophagia 11/28/2014   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[place]) 03/31/2014   HTN (hypertension) 03/31/2014   Partial epilepsy with impairment of consciousness ([place]) 04/24/2013   Neurofibromatosis, 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  Rationale for Evaluation and Treatment: Rehabilitation  SUBJECTIVE:                                                                                                                                                                                             SUBJECTIVE STATEMENT: He has had a lot of issues with his vision related to diabetic retinopathy.  He is following up with an eye doctor about this.  He feels his balance is mostly related to his vision issues and that he feel once on loose gravel that he did not see.  He feels weaker and as if he fatigues quickly since his incident. Pt accompanied by: significant other-[name]  PERTINENT HISTORY: I DM, on insulin pump at home, CAD with previous CABG and PCI, HTN, HLP, neurofibromatosis type I and seizure disorder  From Hospital note: "presented to the emergency room on 2/19 with altered mental status.  He was not feeling well for the last 24 hours.  He was found down in the bathroom.  In ED, found to be confused, slow to respond to questions, CBGs > 600 Insulin pump was removed on arrival and cannula was found to be kinked.  Due to significant findings he was admitted to intensive care unit and treated for DKA with insulin drip, IV fluids.  Troponins more than 3000.  WBC was 26,000, lactic acid was more than 9. 2/22, patient continues to improve however with intermittent confusion and difficulties with balance, MRI showed multiple areas of punctate infarction. Neurology consulted"  PRECAUTIONS: Fall  WEIGHT BEARING RESTRICTIONS: No  PAIN:  Are you having pain? No  FALLS: Has  patient fallen in last 6 months? Yes. Number of falls 3 - Pt reports falling on 2/18 prior to hospitalization   LIVING ENVIRONMENT: Lives with: lives with their spouse Lives in: House/apartment Stairs: Yes: Internal: 12 to bedroom on second floor steps; on left going up Has following equipment at home: Walker - 2 wheeled and lift chair - RW being ordered through [employer] in [place]   PLOF: Independent; on disability, was working in [place]: "To get back some strength and back to my community exercise routine."  Pt and wife are members of [employer], but have not been since prior to [place].  OBJECTIVE:  Freestyle Libre blood sugar reading at onset of session:  145  DIAGNOSTIC FINDINGS: MRI of Brain 08/19/2022: IMPRESSION: 1. Multiple punctate foci of abnormal diffusion restriction scattered within both cerebral hemispheres, consistent with acute to subacute infarcts. No hemorrhage or mass effect. 2. Findings of chronic microvascular ischemia and volume loss. 3. Unchanged left scalp lesion, likely neurofibroma.  COGNITION: Overall cognitive status: Within functional limits for tasks assessed   SENSATION: Light touch: WFL  COORDINATION: LE RAMS:  WFL Heel-to-shin:  WFL  EDEMA:  None noted in BLE  POSTURE: rounded shoulders and forward head  LOWER EXTREMITY ROM:     Active  Right Eval Left Eval  Hip flexion Grossly WNL  Hip extension   Hip abduction   Hip adduction   Hip internal rotation   Hip external rotation   Knee flexion   Knee extension   Ankle dorsiflexion   Ankle plantarflexion    Ankle inversion    Ankle eversion     (Blank rows = not tested)  LOWER EXTREMITY MMT:    MMT Right Eval Left Eval  Hip flexion Grossly 4+/5  Hip extension   Hip abduction   Hip adduction   Hip internal rotation   Hip external rotation   Knee flexion   Knee extension   Ankle dorsiflexion   Ankle plantarflexion   Ankle inversion    Ankle eversion     (Blank rows = not tested)  BED MOBILITY:  Sit to supine Complete Independence Supine to sit Complete Independence  TRANSFERS: Assistive device utilized: None  Sit to stand: Modified independence-using arm rests Stand to sit: Modified independence-using arm rests Chair to chair: Modified independence-using arm rests Floor: Complete Independence  GAIT: Gait pattern: step through pattern, shuffling, and wide BOS Distance walked: various clinic distances Assistive device utilized: None Level of assistance: SBA  FUNCTIONAL TESTS:  30 seconds chair stand test:  17 w/ BUE support, pt reports increased fatigue and is extremely dyspneic 2 minute walk test: 475', pt becomes very fatigued upon completion of assessment w/ severe staggering with loss of momentum, denies light-headedness 10 meter walk test: 9.50 seconds = 1.05 m/sec OR 3.47 ft/sec  PATIENT SURVEYS:  None completed due to time constraints.  TODAY'S TREATMENT:  DATE: N/A    PATIENT EDUCATION: Education details: PT POC, assessments used and goals to be set. Person educated: Patient and Spouse Education method: Explanation Education comprehension: verbalized understanding  HOME EXERCISE PROGRAM: To be established.  GOALS: Goals reviewed with patient? Yes  SHORT TERM GOALS=LONG TERM GOALS: Target date: 10/08/2022  Pt will be independent and report compliance to HEP established for functional strength and dynamic balance. Baseline:  To be established. Goal status: INITIAL  2.  Pt will ambulate >/=800 feet over level and unlevel surfaces without AD independently to promote household and community access. Baseline: 475' Goal status: INITIAL  3.  Pt will demonstrate a gait speed of >4.0 feet/sec in order to decrease risk for falls. Baseline: 3.47 ft/sec Goal status: INITIAL  4.  Pt will  perform >/=17 sit to stands in 30 seconds w/o BUE support to demonstrate decreased risk for falls and improved functional LE strength. Baseline: 17 STS w/ BUE support Goal status: INITIAL  ASSESSMENT:  CLINICAL IMPRESSION: Patient is a 57 y.o. male who was seen today for physical therapy evaluation and treatment for imbalance following hospitalization for DKA and multiple acute on subacute infarctions.  Pt has a significant PMH of DMI on insulin pump at home, CAD with previous CABG and PCI, HTN, HLP, neurofibromatosis type I and seizure disorder.  Identified impairments include shuffling gait and decreased cardiovascular endurance.  Pt has chronic visual changes also impairing balance.  Evaluation via the following assessment tools: 10MWT indicate mild fall risk.  30 second chair stand has no comparable metrics available for patient age, but compared to the next age group up he is WNL.  He appears limited by cardiovascular endurance evident by staggering balance at the conclusion of the 2MWT despite completing 475' during the assessment.  He would benefit from skilled PT to address impairments as noted and progress towards long term goals.  OBJECTIVE IMPAIRMENTS: Abnormal gait, decreased activity tolerance, decreased balance, and decreased endurance.   ACTIVITY LIMITATIONS: lifting, bending, stairs, transfers, and locomotion level  PARTICIPATION LIMITATIONS: community activity  PERSONAL FACTORS: Age, Fitness, Past/current experiences, and 3+ comorbidities: DMI on insulin pump, CAD w/ prior CABG, HTN, HLP, and neurofibromatosis  are also affecting patient's functional outcome.   REHAB POTENTIAL: Good  CLINICAL DECISION MAKING: Evolving/moderate complexity  EVALUATION COMPLEXITY: Moderate  PLAN:  PT FREQUENCY: 1x/week  PT DURATION: 4 weeks  PLANNED INTERVENTIONS: Therapeutic exercises, Therapeutic activity, Neuromuscular re-education, Balance training, Gait training, Patient/Family  education, Self Care, Stair training, Vestibular training, DME instructions, and Re-evaluation  PLAN FOR NEXT SESSION: Monitor blood sugar-pt has freestyle libre.  Establish HEP for functional strength and dynamic balance, gait training, endurance   Bary Ishmeal, PT, DPT 09/09/2022, 10:22 AM

## 2022-09-09 NOTE — Therapy (Signed)
OUTPATIENT OCCUPATIONAL THERAPY NEURO EVALUATION  Patient Name: Jeff [name] MRN: [id number] DOB:12-03-1965, 57 y.o., male Today's Date: 09/09/2022  PCP: Crist Infante, MD  REFERRING PROVIDER: Mendel Corning, MD;  Gerarda Fraction, MD   END OF SESSION:  OT End of Session - 09/09/22 1123     Visit Number 1    Number of Visits 11    Date for OT Re-Evaluation 11/26/22    Authorization Type [employer] - auth required, requesting 10 visits    OT Start Time [phone]    OT Stop Time 1016    OT Time Calculation (min) 32 min    Activity Tolerance Patient tolerated treatment well    Behavior During Therapy [facility] for tasks assessed/performed             Past Medical History:  Diagnosis Date   Anxiety    CAD (coronary artery disease)    Cath 2011.  LAD stent patent, distal LAD occlusion, D1 100%, OM branch 80 - 90%, SVG to diag patent, LIMA to LAD occluded, SVG to RCA occluded, SVG to OM was occluded.   No change from previous cath.  Managed medically.    Cataract    Depression    Diabetes mellitus type I ([place])    on insulin pump at  home   Diabetic retinopathy    s/p vitrectomy and history of retinal surgery   Hemorrhoid    History of oxygen administration    nocutural use only at 2 l/m nasally.   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    CORONARY ARTERY BYPASS GRAFT     4 vessel 4/11   ESOPHAGOGASTRODUODENOSCOPY (EGD) WITH PROPOFOL N/A 12/05/2014   Procedure: ESOPHAGOGASTRODUODENOSCOPY (EGD) WITH PROPOFOL;  Surgeon: Milus Banister, MD;  [facility];  Service: Endoscopy;  Laterality: N/A;   LAPAROSCOPIC APPENDECTOMY  11/24/2011   LEFT HEART CATHETERIZATION WITH CORONARY/GRAFT ANGIOGRAM N/A 02/22/2013   Procedure: LEFT HEART CATHETERIZATION WITH Beatrix Fetters;  Surgeon: Sherren Mocha, MD;  Location: [facility] CATH LAB;  Service: Cardiovascular;  Laterality: N/A;   release of right transverse carpal ligament     right eye vitrectomy and detached retina repair  10/2007   TONSILLECTOMY     uvulopalatoplasty surgery     Patient Active Problem List   Diagnosis Date Noted   CVA (cerebral vascular accident) ([place]) 08/23/2022   Confusion 08/23/2022   Acute arterial ischemic stroke, multifocal, mult vascular territories ([place]) 08/20/2022   Encephalopathy acute 08/17/2022   Uncontrolled type 1 diabetes mellitus with hyperglycemia ([place]) 0000000   Acute metabolic encephalopathy 0000000   DKA (diabetic ketoacidosis) ([place]) 08/16/2022   AKI (acute kidney injury) ([place]) 08/16/2022   Hyperkalemia 08/16/2022   Special screening for malignant neoplasms, colon  Diverticulosis of colon without hemorrhage    Toe fracture, right    Sleep apnea    Seizure disorder (HCC)    Seasonal allergic rhinitis    Myocardial infarction ([place])    Hyperlipidemia    History of oxygen administration    Hemorrhoid    Diabetes mellitus type I ([place])    Depression    Cataract    CAD (coronary artery disease)    Anxiety    Odynophagia 11/28/2014   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[place]) 03/31/2014   HTN (hypertension) 03/31/2014   Partial epilepsy with impairment of consciousness ([place]) 04/24/2013   Neurofibromatosis, 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[place]) 04/24/2013   Pain in limb 04/24/2013   Encounter for therapeutic drug  monitoring 04/24/2013   Postop check 12/08/2011   Leg pain, left 12/17/2010   HYPERLIPIDEMIA-MIXED 11/28/2009   ACUT MI SUBENDOCARDIAL INFARCT SUBSQT EPIS CARE 11/28/2009   CORONARY ATHEROSLERO AUTOL VEIN BYPASS GRAFT 11/28/2009    ONSET DATE: 08/15/2022  REFERRING DIAG: I63.9 (ICD-10-CM) - Ischemic cerebrovascular accident (CVA) ; E11.3599 (ICD-10-CM) - Diabetic, retinopathy, proliferative   THERAPY DIAG:  Other lack of coordination  Visuospatial deficit  Other disturbances of skin sensation  Other symptoms and signs involving the nervous system  Rationale for Evaluation and Treatment: Rehabilitation  SUBJECTIVE:   SUBJECTIVE STATEMENT: Has been able to navigate steps to second floor since hospital d/c. More difficulty opening Coke bottle with left hand twisting on top. He is scheduled for low vision evaluation with OT on 09/30/2022 at this clinic.   Pt accompanied by: significant other - wife, [name]  PERTINENT HISTORY: From hospital discharge, "Patient is a 57 year old male with type I DM, on insulin pump at home, CAD with previous CABG and PCI, HTN, HLP, neurofibromatosis type I and seizure disorder who presented to the emergency room on 2/19 with altered mental status.  He was not feeling well for the last 24 hours.  He was found down in the bathroom.  In ED, found to be confused, slow to respond to questions, CBGs > 600 Insulin pump was removed on arrival and cannula was found to be kinked.  Due to significant findings he was admitted to intensive care unit and treated for DKA with insulin drip, IV fluids.  Troponins more than 3000.  WBC was 26,000, lactic acid was more than 9. 2/22, patient continues to improve however with intermittent confusion and difficulties with balance, MRI showed multiple areas of punctate infarction. Neurology consulted"  PRECAUTIONS: Fall  WEIGHT BEARING RESTRICTIONS: No  PAIN:  Are you having pain? No  FALLS: Has patient fallen in last 6  months? Yes. Number of falls 3 - Pt reports falling on 2/18 prior to hospitalization  LIVING ENVIRONMENT: Lives with: lives with their spouse Lives in: House/apartment Stairs: Yes: Internal: 12 to bedroom on second floor steps; on left going up Has following equipment at home: Walker - 2 wheeled and lift chair - RW being ordered through [employer] in [place]  PLOF: Independent; on disability, was working in [employer]  PATIENT GOALS: Improve functional use of LUE and occupational performance with respect to visual impairments.   OBJECTIVE:   HAND DOMINANCE: Ambidextrous; writes and eats left handed  ADLs: Overall ADLs: no decline since CVA  IADLs: Overall: no reported change Handwriting:  no reported change  MOBILITY STATUS: Independent  ACTIVITY TOLERANCE: Activity tolerance: fair  FUNCTIONAL OUTCOME MEASURES: FOTO: 91 (Slight impairment)    UPPER EXTREMITY ROM:    BUE - WNL  UPPER EXTREMITY MMT:     BUE - WNL  HAND FUNCTION: Grip strength: Right: 62.3 lbs; Left: 60.1 lbs  COORDINATION: 9 Hole Peg test: Right: 28 sec; Left: 32 sec  SENSATION: WFL; some chronic paresthesias pt attributes to finger pricks  EDEMA: none observed or reported  MUSCLE TONE: WFL  COGNITION: Overall cognitive status: Within functional limits for tasks assessed  VISION: Subjective report: more impacted by glare and font type Baseline vision:  requires use of glasses for distance and near  Visual history:  proliferative diabetic retinopathy, optic atrophy OU, cataract removal, and positive pressure ventilation  VISION ASSESSMENT: Was scheduled for low vision assessment 09/30/2022. Will further assess at that time.   PERCEPTION: Impaired: due to visual impairments  PRAXIS: Not tested  OBSERVATIONS: Pt appears well kept. Ambulates from lobby to treatment room without use of AD, no LOB. Has glasses on eyeglass strap.   TODAY'S TREATMENT:                                                                                                                                N/A this visit.   PATIENT EDUCATION: Education details: OT role and POC Person educated: Patient and Spouse Education method: Explanation Education comprehension: verbalized understanding  HOME EXERCISE PROGRAM: N/a this visit  GOALS:  SHORT TERM GOALS: Target date: 10/08/2022   Patient will demonstrate  RUE HEP with 25% verbal cues or less for proper execution. Baseline: Goal status: INITIAL  2.  Patient will independently recall at least 2 compensatory strategies for visual impairment without cueing. Baseline:  Goal status: INITIAL  3.  Pt to complete low vision assessment.  Baseline:  Goal status: INITIAL  4.  Patient will complete nine-hole peg with use of L in 30 seconds or less for at least 2 visits. Baseline: 30 Goal status: INITIAL  LONG TERM GOALS: Target date: 11/26/2022  Patient will demonstrate updated RUE HEP with 25% verbal cues or less for proper execution. Baseline:  Goal status: INITIAL  2.  Will set appropriate low vision goal.  Baseline:  Goal status: INITIAL  3.  Patient will report no difficulty with lifting heavy objects with use of LUE Baseline: a little Goal status: INITIAL  4.  Patient will complete FOTO at discharge. Baseline:  Goal status: INITIAL   ASSESSMENT:  CLINICAL IMPRESSION: Patient is a 57 y.o. male who was seen today for occupational therapy evaluation for CVA and diabetic retinopathy. Hx includes anxiety, CAD, depression, cataract extraction, HTN, HLD, MI, neurofibromatosis, seizure disorder, sleep apnea, diabetes, and diverticulitis, DKA, AKI, and confusion.  He also has a history of multiple laser treatments to both eyes due to diabetic retinopathy, cataract extraction to both eyes, positive pressure ventilation to both eyes retinal detachment of right eye following scleral buckling.  He is followed by [facility].   Patient currently presents below baseline level of functioning demonstrating functional deficits and impairments as  noted below. Pt would benefit from skilled OT services in the outpatient setting to work on impairments as noted below to help pt return to PLOF as able.    PERFORMANCE DEFICITS: in functional skills including ADLs, IADLs, coordination, strength, Fine motor control, vision, and UE functional use.   IMPAIRMENTS: are limiting patient from ADLs, IADLs, play, leisure, and social participation.   CO-MORBIDITIES: has co-morbidities such as diabetic retinopathy  that affects occupational performance. Patient will benefit from skilled OT to address above impairments and improve overall function.  MODIFICATION OR ASSISTANCE TO COMPLETE EVALUATION: Min-Moderate modification of tasks or assist with assess necessary to complete an evaluation.  OT OCCUPATIONAL PROFILE AND HISTORY: Detailed assessment: Review of records and additional review of physical, cognitive, psychosocial history related to current functional performance.  CLINICAL DECISION MAKING: LOW - limited treatment options, no task modification necessary  REHAB POTENTIAL: Fair given diagnoses  EVALUATION COMPLEXITY: Low    PLAN:  OT FREQUENCY: 1x/week  OT DURATION: 10 weeks  PLANNED INTERVENTIONS: self care/ADL training, therapeutic exercise, therapeutic activity, neuromuscular re-education, functional mobility training, ultrasound, paraffin, fluidotherapy, moist heat, patient/family education, visual/perceptual remediation/compensation, DME and/or AE instructions, and Dry needling  RECOMMENDED OTHER SERVICES: none at this time  CONSULTED AND AGREED WITH PLAN OF CARE: Patient and family member/caregiver  PLAN FOR NEXT SESSION: LUE coordination and strengthening HEP  Dennis Bast, OT 09/09/2022, 11:38 AM

## 2022-09-09 NOTE — Telephone Encounter (Signed)
Pt came into office stated his pharmacy stated they need a new escript from our office of  TEGRETOL-XR 200 MG 12 hr tablet. I called and spoke with pharmacy and they stated they have not gotten a script from our office for the drug since February 2023 and need a new one sent.  Pt has 5 days left of medication and states he is needing this sent to  [facility] [id number]   to get his refill.

## 2022-09-09 NOTE — Telephone Encounter (Signed)
  Called and explained to pharmacy what I was seeing on my end and they confirmed the last refill was February and they over-looked this order. They asked for it to be resent for clarification. Please call and update patient

## 2022-09-10 ENCOUNTER — Encounter: Payer: Self-pay | Admitting: Occupational Therapy

## 2022-09-10 DIAGNOSIS — I6389 Other cerebral infarction: Secondary | ICD-10-CM | POA: Diagnosis not present

## 2022-09-10 DIAGNOSIS — N179 Acute kidney failure, unspecified: Secondary | ICD-10-CM | POA: Diagnosis not present

## 2022-09-10 DIAGNOSIS — R0902 Hypoxemia: Secondary | ICD-10-CM | POA: Diagnosis not present

## 2022-09-10 DIAGNOSIS — E875 Hyperkalemia: Secondary | ICD-10-CM | POA: Diagnosis not present

## 2022-09-10 DIAGNOSIS — G9340 Encephalopathy, unspecified: Secondary | ICD-10-CM | POA: Diagnosis not present

## 2022-09-13 ENCOUNTER — Encounter: Payer: Self-pay | Admitting: Cardiovascular Disease

## 2022-09-13 ENCOUNTER — Ambulatory Visit: Payer: Medicare PPO | Attending: Cardiovascular Disease | Admitting: Cardiovascular Disease

## 2022-09-13 VITALS — BP 118/68 | HR 87 | Ht 71.0 in | Wt 225.8 lb

## 2022-09-13 DIAGNOSIS — E782 Mixed hyperlipidemia: Secondary | ICD-10-CM | POA: Diagnosis not present

## 2022-09-13 DIAGNOSIS — I10 Essential (primary) hypertension: Secondary | ICD-10-CM | POA: Diagnosis not present

## 2022-09-13 DIAGNOSIS — I25119 Atherosclerotic heart disease of native coronary artery with unspecified angina pectoris: Secondary | ICD-10-CM

## 2022-09-13 NOTE — Patient Instructions (Signed)
Medication Instructions:  Your physician recommends that you continue on your current medications as directed. Please refer to the Current Medication list given to you today.  *If you need a refill on your cardiac medications before your next appointment, please call your pharmacy*   Lab Work: NONE If you have labs (blood work) drawn today and your tests are completely normal, you will receive your results only by: MyChart Message (if you have MyChart) OR A paper copy in the mail If you have any lab test that is abnormal or we need to change your treatment, we will call you to review the results.   Testing/Procedures: NONE   Follow-Up: At [facility], you and your health needs are our priority.  As part of our continuing mission to provide you with exceptional heart care, we have created designated Provider Care Teams.  These Care Teams include your primary Cardiologist (physician) and Advanced Practice Providers (APPs -  Physician Assistants and Nurse Practitioners) who all work together to provide you with the care you need, when you need it.  We recommend signing up for the patient portal called "MyChart".  Sign up information is provided on this After Visit Summary.  MyChart is used to connect with patients for Virtual Visits (Telemedicine).  Patients are able to view lab/test results, encounter notes, upcoming appointments, etc.  Non-urgent messages can be sent to your provider as well.   To learn more about what you can do with MyChart, go to [email or web address].    Your next appointment:   6 month(s)  Provider:   Michael Cooper, MD   

## 2022-09-13 NOTE — Progress Notes (Signed)
Cardiology Office Note:    Date:  09/13/2022   ID:  Jeff [name], DOB [birth year], MRN [id number]  PCP:  Crist Infante, [place] Prov[facility] Cardiologist:  Sherren Mocha, MD     Referring MD: Crist Infante, MD   Chief Complaint  Patient presents with   Coronary Artery Disease    History of Present Illness:    Jeff [name] is a 57 y.o. male with a hx of coronary artery disease, presenting for follow-up evaluation.  The patient underwent multivessel CABG in [id number] complicated by early bypass graft failure.  He underwent multivessel stenting shortly after bypass surgery.  Most recent heart catheterization in 2014 demonstrated patency of his stent sites with a coronary disease pattern involving diffuse small vessel disease.  Medical therapy was recommended.  LV function has been normal.  The patient was hospitalized in February with DKA.  He had a kink in his insulin pump which precipitated this event.  He was quite ill with a white blood cell count greater than 26,000, lactic acid rater than 9, and troponin of approximately 4000.  He had altered mental status and an MRI showed multiple punctate infarcts.  DAPT was recommended to be continued through 3 weeks, then indefinite clopidogrel per neurology.  An echocardiogram showed normal LVEF with no regional wall motion abnormalities.  His troponin elevation was secondary to demand ischemia without any evidence of true ACS.  He is here with his wife today. We discussed the events around his hospitalization last month.  The patient is back to using his insulin pump and this was started at the time of discharge from the hospital.  He has not had any issues with chest pain, chest pressure, or shortness of breath.  He denies leg edema, heart palpitations, orthopnea, or PND.  Past Medical History:  Diagnosis Date   Anxiety    CAD (coronary artery disease)    Cath 2011.  LAD stent patent, distal LAD occlusion, D1 100%, OM branch 80 -  90%, SVG to diag patent, LIMA to LAD occluded, SVG to RCA occluded, SVG to OM was occluded.   No change from previous cath.  Managed medically.    Cataract    Depression    Diabetes mellitus type I ([place])    on insulin pump at  home   Diabetic retinopathy    s/p vitrectomy and history of retinal surgery   Hemorrhoid    History of oxygen administration    nocutural use only at 2 l/m nasally.   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   CORONARY ARTERY BYPASS GRAFT     4 vessel 4/11   ESOPHAGOGASTRODUODENOSCOPY (EGD) WITH PROPOFOL N/A 12/05/2014   Procedure: ESOPHAGOGASTRODUODENOSCOPY (EGD) WITH  PROPOFOL;  Surgeon: Milus Banister, MD;  Location: [facility];  Service: Endoscopy;  Laterality: N/A;   LAPAROSCOPIC APPENDECTOMY  11/24/2011   LEFT HEART CATHETERIZATION WITH CORONARY/GRAFT ANGIOGRAM N/A 02/22/2013   Procedure: LEFT HEART CATHETERIZATION WITH Beatrix Fetters;  Surgeon: Sherren Mocha, MD;  Location: [facility] CATH LAB;  Service: Cardiovascular;  Laterality: N/A;   release of right transverse carpal ligament     right eye vitrectomy and detached retina repair  10/2007   TONSILLECTOMY     uvulopalatoplasty surgery      Current Medications: Current Meds  Medication Sig   ACCU-CHEK GUIDE test strip    Accu-Chek Softclix Lancets lancets     acetaminophen (TYLENOL) 500 MG tablet Take 1,000 mg by mouth every 6 (six) hours as needed for mild pain.   ADVOCATE INSULIN SYRINGE 31G X 5/16" 1 ML MISC    Alirocumab (PRALUENT) 150 MG/ML SOAJ Inject 150 mg into the skin every 14 (fourteen) days. Every other Tuesday   aspirin 81 MG tablet Take 1 tablet (81 mg total) by mouth daily for 21 days.   Cholecalciferol (VITAMIN D) 2000 units CAPS Take 2,000 Units by mouth daily.    clopidogrel (PLAVIX) 75 MG tablet Take 1 tablet (75 mg total) by mouth daily.   cyanocobalamin 1000 MCG tablet Take 1,000 mcg by mouth daily.   desvenlafaxine (PRISTIQ) 100 MG 24 hr tablet Take 100 mg by mouth daily.   docusate sodium (COLACE) 100 MG capsule Take 100 mg by mouth at bedtime.   ezetimibe (ZETIA) 10 MG tablet Take 10 mg by mouth at bedtime.   insulin aspart (NOVOLOG) 100 UNIT/ML FlexPen Inject 0-9 Units into the skin 3 (three) times daily with meals. Sliding scale correction factor: CBG 70 - 120: 0 units CBG 121 - 150: 1 unit,  CBG 151 - 200: 2 units,  CBG 201 - 250: 3 units,  CBG 251 - 300: 5 units,  CBG 301 - 350: 7 units,  CBG 351 - 400: 9 units   CBG > 400: 9 units and notify your MD   isosorbide mononitrate (IMDUR) 60 MG 24 hr tablet Take 60 mg by mouth every morning.    levETIRAcetam (KEPPRA) 750 MG tablet TAKE 1 TABLET(750 MG) BY MOUTH TWICE DAILY   lisinopril (PRINIVIL,ZESTRIL) 5 MG tablet Take 5 mg by mouth every morning.    metoprolol tartrate (LOPRESSOR) 25 MG tablet TAKE 1 TABLET(25 MG) BY MOUTH TWICE DAILY   Multiple Vitamins-Minerals (CENTRUM SILVER 50+MEN) TABS Take 1 tablet by mouth every morning.   nitroGLYCERIN (NITROSTAT) 0.4 MG SL tablet Place 1 tablet (0.4 mg total) under the tongue every 5 (five) minutes as needed for chest pain.   OXYGEN Place 2 L into the nose at bedtime.   promethazine (PHENERGAN) 25 MG tablet Take 25 mg by mouth every 6 (six) hours as needed for nausea or vomiting.   rosuvastatin (CRESTOR) 40 MG tablet Take 40 mg by  mouth at bedtime.    tamsulosin (FLOMAX) 0.4 MG CAPS capsule Take 1 capsule (0.4 mg total) by mouth daily.   TEGRETOL-XR 200 MG 12 hr tablet TAKE 3 TABLETS BY MOUTH TWO TIMES A DAY.     Allergies:   Doxepin hcl, Venlafaxine, and Latex   Social History   Socioeconomic History   Marital status: Married    Spouse name: [name]   Number of children: 0   Years of education: college   Highest education level: Not on file  Occupational History   Occupation:  Unemployed    Comment: previously worked in Press photographer  Tobacco Use   Smoking status: Never   Smokeless tobacco: Never  Vaping Use   Vaping Use: Never used  Substance and Sexual Activity   Alcohol use: No   Drug use: No   Sexual activity: Yes  Other Topics Concern   Not on file  Social History Narrative   Patient lives with his wifeBenjamine Mola).   Patient does not have any children.   Patient is on disability.   Patient is left-handed (writes), but right hand with sports.   Patient has a college education.   Patient drinks 5-6 cups of caffeine daily.   Social Determinants of Health   Financial Resource Strain: Not on file  Food Insecurity: No Food Insecurity (08/16/2022)   Hunger Vital Sign    Worried About Running Out of Food in the Last Year: Never true    Ran Out of Food in the Last Year: Never true  Transportation Needs: No Transportation Needs (08/26/2022)   PRAPARE - Hydrologist (Medical): No    Lack of Transportation (Non-Medical): No  Physical Activity: Not on file  Stress: Not on file  Social Connections: Not on file     Family History: The patient's family history includes Arthritis in his sister; Coronary artery disease (age of onset: 65) in his father; Crohn's disease in his mother; Diabetes in his father; Hearing loss in his mother; Hypertension in his father; Squamous cell carcinoma in his brother.  ROS:   Please see the history of present illness.    All other systems reviewed  and are negative.  EKGs/Labs/Other Studies Reviewed:    The following studies were reviewed today: Cardiac Studies & Procedures     STRESS TESTS  MYOCARDIAL PERFUSION IMAGING 04/11/2018  Narrative  Nuclear stress EF: 70%.  The left ventricular ejection fraction is hyperdynamic (>65%).  There was no ST segment deviation noted during stress. Transient second degree and third degree AV block were noted during infusion.  The study is normal.  This is a low risk study.   ECHOCARDIOGRAM  ECHOCARDIOGRAM COMPLETE 08/20/2022  Narrative ECHOCARDIOGRAM REPORT    Patient Name:   JAHDAI BEYERS Date of Exam: 08/20/2022 Medical Rec #:  [id number]       Height:       71.0 in Accession #:    [id number]      Weight:       223.3 lb Date of Birth:  Apr 16, 1966        BSA:          2.210 m Patient Age:    18 years        BP:           132/69 mmHg Patient Gender: M               HR:           71 bpm. Exam Location:  Inpatient  Procedure: 2D Echo  Indications:    Stroke  History:        Patient has prior history of Echocardiogram examinations. CAD, Prior CABG; Risk Factors:Hypertension, Diabetes and Dyslipidemia.  Sonographer:    Ronny Flurry Referring Phys: [id number] [place]   1. Left ventricular ejection fraction, by estimation, is 60 to 65%. The left ventricle has normal function. The left ventricle has no regional wall motion abnormalities. There is mild concentric left ventricular hypertrophy. Left ventricular diastolic parameters are consistent  with Grade I diastolic dysfunction (impaired relaxation). 2. Right ventricular systolic function is normal. The right ventricular size is not well visualized. 3. Left atrial size was mildly dilated. 4. The mitral valve is abnormal. Trivial mitral valve regurgitation. 5. The aortic valve is tricuspid. There is mild calcification of the aortic valve. There is mild thickening of the aortic valve. Aortic valve  regurgitation is not visualized. Aortic valve sclerosis/calcification is present, without any evidence of aortic stenosis. 6. Aortic dilatation noted. There is borderline dilatation of the aortic root, measuring 37 mm.  Conclusion(s)/Recommendation(s): No intracardiac source of embolism detected on this transthoracic study. Consider a transesophageal echocardiogram to exclude cardiac source of embolism if clinically indicated.  FINDINGS Left Ventricle: Left ventricular ejection fraction, by estimation, is 60 to 65%. The left ventricle has normal function. The left ventricle has no regional wall motion abnormalities. The left ventricular internal cavity size was normal in size. There is mild concentric left ventricular hypertrophy. Left ventricular diastolic parameters are consistent with Grade I diastolic dysfunction (impaired relaxation).  Right Ventricle: The right ventricular size is not well visualized. Right vetricular wall thickness was not well visualized. Right ventricular systolic function is normal.  Left Atrium: Left atrial size was mildly dilated.  Right Atrium: Right atrial size was not well visualized.  Pericardium: There is no evidence of pericardial effusion.  Mitral Valve: The mitral valve is abnormal. There is mild thickening of the mitral valve leaflet(s). There is mild calcification of the mitral valve leaflet(s). Mild to moderate mitral annular calcification. Trivial mitral valve regurgitation.  Tricuspid Valve: The tricuspid valve is normal in structure. Tricuspid valve regurgitation is mild.  Aortic Valve: The aortic valve is tricuspid. There is mild calcification of the aortic valve. There is mild thickening of the aortic valve. Aortic valve regurgitation is not visualized. Aortic valve sclerosis/calcification is present, without any evidence of aortic stenosis. Aortic valve mean gradient measures 3.5 mmHg. Aortic valve peak gradient measures 5.9 mmHg. Aortic valve  area, by VTI measures 3.22 cm.  Pulmonic Valve: The pulmonic valve was normal in structure. Pulmonic valve regurgitation is trivial.  Aorta: Aortic dilatation noted. There is borderline dilatation of the aortic root, measuring 37 mm.  IAS/Shunts: The atrial septum is grossly normal.   LEFT VENTRICLE PLAX 2D LVIDd:         3.70 cm   Diastology LVIDs:         2.30 cm   LV e' medial:    12.00 cm/s LV PW:         1.50 cm   LV E/e' medial:  9.3 LV IVS:        1.60 cm   LV e' lateral:   9.90 cm/s LVOT diam:     2.20 cm   LV E/e' lateral: 11.3 LV SV:         71 LV SV Index:   32 LVOT Area:     3.80 cm   RIGHT VENTRICLE RV S prime:     9.73 cm/s  LEFT ATRIUM           Index LA diam:      3.90 cm 1.76 cm/m LA Vol (A2C): 38.8 ml 17.56 ml/m LA Vol (A4C): 41.1 ml 18.60 ml/m AORTIC VALVE AV Area (Vmax):    3.43 cm AV Area (Vmean):   3.28 cm AV Area (VTI):     3.22 cm AV Vmax:           121.50 cm/s AV Vmean:  83.950 cm/s AV VTI:            0.222 m AV Peak Grad:      5.9 mmHg AV Mean Grad:      3.5 mmHg LVOT Vmax:         109.67 cm/s LVOT Vmean:        72.467 cm/s LVOT VTI:          0.188 m LVOT/AV VTI ratio: 0.85  AORTA Ao Root diam: 3.70 cm Ao Asc diam:  3.50 cm  MITRAL VALVE                TRICUSPID VALVE MV Area (PHT): 3.53 cm     TR Peak grad:   9.5 mmHg MV Decel Time: 215 msec     TR Vmax:        154.00 cm/s MV E velocity: 112.00 cm/s MV A velocity: 116.00 cm/s  SHUNTS MV E/A ratio:  0.97         Systemic VTI:  0.19 m Systemic Diam: 2.20 cm  Gwyndolyn Kaufman MD Electronically signed by Gwyndolyn Kaufman MD Signature Date/Time: 08/20/2022/4:00:35 PM    Final              EKG:  EKG is not ordered today.   Recent Labs: 08/16/2022: ALT 35; Magnesium 2.6; TSH 3.749 08/18/2022: Hemoglobin 12.9; Platelets 160 08/22/2022: BUN 6; Creatinine, Ser 0.66; Potassium 3.6; Sodium 139  Recent Lipid Panel    Component Value Date/Time   CHOL 95 08/21/2022  0959   TRIG 157 (H) 08/21/2022 0959   HDL 39 (L) 08/21/2022 0959   CHOLHDL 2.4 08/21/2022 0959   VLDL 31 08/21/2022 0959   LDLCALC 25 08/21/2022 0959     Risk Assessment/Calculations:                Physical Exam:    VS:  BP 118/68   Pulse 87   Ht 5\' 11"  (1.803 m)   Wt 225 lb 12.8 oz (102.4 kg)   SpO2 95%   BMI 31.49 kg/m     Wt Readings from Last 3 Encounters:  09/13/22 225 lb 12.8 oz (102.4 kg)  08/23/22 233 lb 6.4 oz (105.9 kg)  04/21/22 233 lb (105.7 kg)     GEN:  Well nourished, well developed in no acute distress HEENT: Normal NECK: No JVD; No carotid bruits LYMPHATICS: No lymphadenopathy CARDIAC: RRR, no murmurs, rubs, gallops RESPIRATORY:  Clear to auscultation without rales, wheezing or rhonchi  ABDOMEN: Soft, non-tender, non-distended MUSCULOSKELETAL:  No edema; No deformity  SKIN: Warm and dry NEUROLOGIC:  Alert and oriented x 3 PSYCHIATRIC:  Normal affect   ASSESSMENT:    1. Coronary artery disease involving native coronary artery of native heart with angina pectoris ([place])   2. Mixed hyperlipidemia   3. Essential hypertension    PLAN:    In order of problems listed above:  Recent troponin increase related to demand ischemia in the setting of severe DKA. He had marked metabolic abnormalities at that time with lactate >9 and pH<7. He doesn't recall having angina but difficult to know because of his metabolic encephalopathy associated with DKA. Echo findings are reassuring with normal LVEF and no regional wall motion abnormalities. He has taken DAPT for 3 weeks after his stroke and is now going to transition from ASA to clopidogrel monotherapy per recommendations from stroke neurology. We discussed this today with respect to his CAD and I am comfortable with him on clopidogrel monotherapy. Continue isosorbide and  metoprolol for anti-anginal Rx.  Now on praluent, rosuvastatin, and ezetimibe. LDL 25. Continue current Rx.  BP well-controlled on current  Rx. Continue lisinopril and metoprolol at current doses.       Medication Adjustments/Labs and Tests Ordered: Current medicines are reviewed at length with the patient today.  Concerns regarding medicines are outlined above.  No orders of the defined types were placed in this encounter.  No orders of the defined types were placed in this encounter.   There are no Patient Instructions on file for this visit.   Signed, Sherren Mocha, MD  09/13/2022 11:25 AM    [place]

## 2022-09-16 ENCOUNTER — Encounter: Payer: Self-pay | Admitting: Physical Therapy

## 2022-09-16 ENCOUNTER — Ambulatory Visit: Payer: Medicare PPO | Admitting: Occupational Therapy

## 2022-09-16 ENCOUNTER — Ambulatory Visit: Payer: Medicare PPO | Admitting: Physical Therapy

## 2022-09-16 ENCOUNTER — Encounter: Payer: Self-pay | Admitting: Occupational Therapy

## 2022-09-16 VITALS — BP 125/63 | HR 78

## 2022-09-16 DIAGNOSIS — R41842 Visuospatial deficit: Secondary | ICD-10-CM | POA: Diagnosis not present

## 2022-09-16 DIAGNOSIS — R278 Other lack of coordination: Secondary | ICD-10-CM

## 2022-09-16 DIAGNOSIS — M6281 Muscle weakness (generalized): Secondary | ICD-10-CM

## 2022-09-16 DIAGNOSIS — I6389 Other cerebral infarction: Secondary | ICD-10-CM | POA: Diagnosis not present

## 2022-09-16 DIAGNOSIS — E103593 Type 1 diabetes mellitus with proliferative diabetic retinopathy without macular edema, bilateral: Secondary | ICD-10-CM | POA: Diagnosis not present

## 2022-09-16 DIAGNOSIS — R29818 Other symptoms and signs involving the nervous system: Secondary | ICD-10-CM | POA: Diagnosis not present

## 2022-09-16 DIAGNOSIS — R208 Other disturbances of skin sensation: Secondary | ICD-10-CM

## 2022-09-16 DIAGNOSIS — R2681 Unsteadiness on feet: Secondary | ICD-10-CM | POA: Diagnosis not present

## 2022-09-16 NOTE — Patient Instructions (Addendum)
"  Pen Tricks" Bigger object = easier  Rotation- Twirl pen like a baton 10-15 x one direction, then switch directions   Shift- Hold pen like a dart at the tip and shift the pen ("inch worm") until holding at the base of the pen. Shift back to the tip of the pen and repeat 10-15x   Flip- With hand down on the table, hold pen in writing position, then flip it to an erase position. Flip it back to writing position and repeat 10-15x   Translation- With your palm up to the sky, put an object in your palm. Without tipping your hand, use your fingers and thumb to move the object to the tips of each of your fingers, one-at-a-time.  Repeat 5-10x each finger.

## 2022-09-16 NOTE — Therapy (Signed)
OUTPATIENT OCCUPATIONAL THERAPY NEURO TREATMENT  Patient Name: Jeff [name] MRN: [id number] DOB:Jul 12, 1965, 57 y.o., male Today's Date: 09/16/2022  PCP: Crist Infante, MD  REFERRING PROVIDER: Mendel Corning, MD;  Gerarda Fraction, MD   END OF SESSION:  OT End of Session - 09/16/22 1014     Visit Number 2    Number of Visits 11    Date for OT Re-Evaluation 11/26/22    Authorization Type [employer] - auth required, requesting 10 visits    OT Start Time 1017    OT Stop Time 1100    OT Time Calculation (min) 43 min    Activity Tolerance Patient tolerated treatment well    Behavior During Therapy [facility] for tasks assessed/performed              Past Medical History:  Diagnosis Date   Anxiety    CAD (coronary artery disease)    Cath 2011.  LAD stent patent, distal LAD occlusion, D1 100%, OM branch 80 - 90%, SVG to diag patent, LIMA to LAD occluded, SVG to RCA occluded, SVG to OM was occluded.   No change from previous cath.  Managed medically.    Cataract    Depression    Diabetes mellitus type I ([place])    on insulin pump at  home   Diabetic retinopathy    s/p vitrectomy and history of retinal surgery   Hemorrhoid    History of oxygen administration    nocutural use only at 2 l/m nasally.   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    CORONARY ARTERY BYPASS GRAFT     4 vessel 4/11   ESOPHAGOGASTRODUODENOSCOPY (EGD) WITH PROPOFOL N/A 12/05/2014   Procedure: ESOPHAGOGASTRODUODENOSCOPY (EGD) WITH PROPOFOL;  Surgeon: Milus Banister, MD;  Location: [facility];  Service: Endoscopy;  Laterality: N/A;   LAPAROSCOPIC APPENDECTOMY  11/24/2011   LEFT HEART CATHETERIZATION WITH CORONARY/GRAFT ANGIOGRAM N/A 02/22/2013   Procedure: LEFT HEART CATHETERIZATION WITH Beatrix Fetters;  Surgeon: Sherren Mocha, MD;  Location: [facility] CATH LAB;  Service: Cardiovascular;  Laterality: N/A;   release of right transverse carpal ligament     right eye vitrectomy and detached retina repair  10/2007   TONSILLECTOMY     uvulopalatoplasty surgery     Patient Active Problem List   Diagnosis Date Noted   CVA (cerebral vascular accident) ([place]) 08/23/2022   Confusion 08/23/2022   Acute arterial ischemic stroke, multifocal, mult vascular territories ([place]) 08/20/2022   Encephalopathy acute 08/17/2022   Uncontrolled type 1 diabetes mellitus with hyperglycemia ([place]) 0000000   Acute metabolic encephalopathy 0000000   DKA (diabetic ketoacidosis) ([place]) 08/16/2022   AKI (acute kidney injury) ([place]) 08/16/2022   Hyperkalemia 08/16/2022   Special screening for malignant neoplasms, colon  Diverticulosis of colon without hemorrhage    Toe fracture, right    Sleep apnea    Seizure disorder (HCC)    Seasonal allergic rhinitis    Myocardial infarction ([place])    Hyperlipidemia    History of oxygen administration    Hemorrhoid    Diabetes mellitus type I ([place])    Depression    Cataract    CAD (coronary artery disease)    Anxiety    Odynophagia 11/28/2014   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[place]) 03/31/2014   HTN (hypertension) 03/31/2014   Partial epilepsy with impairment of consciousness ([place]) 04/24/2013   Neurofibromatosis, 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[place]) 04/24/2013   Pain in limb 04/24/2013   Encounter for therapeutic drug  monitoring 04/24/2013   Postop check 12/08/2011   Leg pain, left 12/17/2010   HYPERLIPIDEMIA-MIXED 11/28/2009   ACUT MI SUBENDOCARDIAL INFARCT SUBSQT EPIS CARE 11/28/2009   CORONARY ATHEROSLERO AUTOL VEIN BYPASS GRAFT 11/28/2009    ONSET DATE: 08/15/2022  REFERRING DIAG: I63.9 (ICD-10-CM) - Ischemic cerebrovascular accident (CVA) ; E11.3599 (ICD-10-CM) - Diabetic, retinopathy, proliferative   THERAPY DIAG:  Other lack of coordination  Visuospatial deficit  Other disturbances of skin sensation  Other symptoms and signs involving the nervous system  Acute arterial ischemic stroke, multifocal, mult vascular territories ([place])  Proliferative diabetic retinopathy of both eyes associated with type 1 diabetes mellitus, unspecified proliferative retinopathy type ([place])  Rationale for Evaluation and Treatment: Rehabilitation  SUBJECTIVE:   SUBJECTIVE STATEMENT: He brought in his walker today as requested by PT.   Pt accompanied by: self   PERTINENT HISTORY: From hospital discharge, "Patient is a 57 year old male with type I DM, on insulin pump at home, CAD with previous CABG and PCI, HTN, HLP, neurofibromatosis type I and seizure disorder who presented to the emergency room on 2/19 with altered mental status.  He was not feeling well for the last 24 hours.  He was found down in the bathroom.  In ED, found to be confused, slow to respond to questions, CBGs > 600 Insulin pump was removed on arrival and cannula was found to be kinked.  Due to significant findings he was admitted to intensive care unit and treated for DKA with insulin drip, IV fluids.  Troponins more than 3000.  WBC was 26,000, lactic acid was more than 9. 2/22, patient continues to improve however with intermittent confusion and difficulties with balance, MRI showed multiple areas of punctate infarction. Neurology consulted"  PRECAUTIONS: Fall  WEIGHT BEARING RESTRICTIONS: No  PAIN:  Are you having pain? No  FALLS:  Has patient fallen in last 6 months? Yes. Number of falls 3 - Pt reports falling on 2/18 prior to hospitalization  LIVING ENVIRONMENT: Lives with: lives with their spouse Lives in: House/apartment Stairs: Yes: Internal: 12 to bedroom on second floor steps; on left going up Has following equipment at home: Walker - 2 wheeled and lift chair - RW being ordered through [employer] in [place]  PLOF: Independent; on disability, was working in [employer]  PATIENT GOALS: Improve functional use of LUE and occupational performance with respect to visual impairments.   OBJECTIVE:   HAND DOMINANCE: Ambidextrous; writes and eats left handed  ADLs: Overall ADLs: no decline since CVA  IADLs: Overall: no reported change Handwriting:  no reported change  MOBILITY STATUS: Independent  ACTIVITY TOLERANCE: Activity tolerance: fair  FUNCTIONAL OUTCOME MEASURES: FOTO: 91 (Slight impairment)    UPPER EXTREMITY ROM:    BUE - WNL  UPPER EXTREMITY  MMT:     BUE - WNL  HAND FUNCTION: Grip strength: Right: 62.3 lbs; Left: 60.1 lbs  COORDINATION: 9 Hole Peg test: Right: 28 sec; Left: 32 sec  SENSATION: WFL; some chronic paresthesias pt attributes to finger pricks  EDEMA: none observed or reported  MUSCLE TONE: WFL  COGNITION: Overall cognitive status: Within functional limits for tasks assessed  VISION: Subjective report: more impacted by glare and font type Baseline vision:  requires use of glasses for distance and near  Visual history:  proliferative diabetic retinopathy, optic atrophy OU, cataract removal, and positive pressure ventilation  VISION ASSESSMENT: Was scheduled for low vision assessment 09/30/2022. Will further assess at that time.   PERCEPTION: Impaired: due to visual impairments  PRAXIS: Not tested  OBSERVATIONS: Pt appears well kept. Ambulates from lobby to treatment room without use of AD, no LOB. Has glasses on eyeglass strap.   TODAY'S TREATMENT:                                                                                                                                - Therapeutic exercises completed for duration as noted below including:  OT initiated LUE coordination and pen tricks HOME EXERCISE PROGRAMs as noted in patient instructions including pick up and placement of items, shuffling and turning cards, item stacking and unstacking, rolling a piece of tissue paper into a ball, rolling golf balls in hand, rolling a pen in between fingers and thumb, picking up and storing items in hand,  and then translating stored items to tips of thumb and index finger before placing them individually into a container. Patient able to return demonstration of all exercises. Handout provided.  PATIENT EDUCATION: Education details: Pen tricks and LUE coordination HEPs Person educated: Patient Education method: Explanation, Demonstration, and Handouts Education comprehension: verbalized understanding  HOME EXERCISE PROGRAM: N/a this visit  GOALS:  SHORT TERM GOALS: Target date: 10/08/2022   Patient will demonstrate  RUE HEP with 25% verbal cues or less for proper execution. Baseline: Goal status: INITIAL  2.  Patient will independently recall at least 2 compensatory strategies for visual impairment without cueing. Baseline:  Goal status: INITIAL  3.  Pt to complete low vision assessment.  Baseline:  Goal status: INITIAL  4.  Patient will complete nine-hole peg with use of L in 30 seconds or less for at least 2 visits. Baseline: 30 Goal status: INITIAL  LONG TERM GOALS: Target date: 11/26/2022  Patient will demonstrate updated RUE HEP with 25% verbal cues or less for proper execution. Baseline:  Goal status: INITIAL  2.  Will set appropriate low vision goal.  Baseline:  Goal status: INITIAL  3.  Patient will report no difficulty with lifting heavy objects with use of LUE Baseline: a little Goal status: INITIAL  4.  Patient will complete FOTO at  discharge. Baseline:  Goal status: INITIAL   ASSESSMENT:  CLINICAL IMPRESSION: Pt would benefit from skilled OT  services in the outpatient setting to work on impairments as noted below to help pt return to PLOF as able.    PERFORMANCE DEFICITS: in functional skills including ADLs, IADLs, coordination, strength, Fine motor control, vision, and UE functional use.   IMPAIRMENTS: are limiting patient from ADLs, IADLs, play, leisure, and social participation.   CO-MORBIDITIES: has co-morbidities such as diabetic retinopathy  that affects occupational performance. Patient will benefit from skilled OT to address above impairments and improve overall function.  REHAB POTENTIAL: Fair given diagnoses   PLAN:  OT FREQUENCY: 1x/week  OT DURATION: 10 weeks  PLANNED INTERVENTIONS: self care/ADL training, therapeutic exercise, therapeutic activity, neuromuscular re-education, functional mobility training, ultrasound, paraffin, fluidotherapy, moist heat, patient/family education, visual/perceptual remediation/compensation, DME and/or AE instructions, and Dry needling  RECOMMENDED OTHER SERVICES: none at this time  CONSULTED AND AGREED WITH PLAN OF CARE: Patient and family member/caregiver  PLAN FOR NEXT SESSION: LUE coordination and strengthening HEP  Dennis Bast, OT 09/16/2022, 3:47 PM

## 2022-09-16 NOTE — Therapy (Signed)
OUTPATIENT PHYSICAL THERAPY NEURO TREATMENT   Patient Name: Jeff [name] MRN: [id number] DOB:[birth year], 57 y.o., male Today's Date: 09/16/2022   PCP: Crist Infante, MD REFERRING PROVIDER: Mendel Corning, MD  END OF SESSION:  PT End of Session - 09/16/22 1152     Visit Number 2    Number of Visits 5   4 + eval   Date for PT Re-Evaluation 10/15/22   pushed out due to scheduling   Authorization Type [employer] MEDICARE    PT Start Time [id number]    PT Stop Time 1226    PT Time Calculation (min) 37 min    Equipment Utilized During Treatment Gait belt    Activity Tolerance Patient tolerated treatment well    Behavior During Therapy WFL for tasks assessed/performed            Past Medical History:  Diagnosis Date   Anxiety    CAD (coronary artery disease)    Cath 2011.  LAD stent patent, distal LAD occlusion, D1 100%, OM branch 80 - 90%, SVG to diag patent, LIMA to LAD occluded, SVG to RCA occluded, SVG to OM was occluded.   No change from previous cath.  Managed medically.    Cataract    Depression    Diabetes mellitus type I ([place])    on insulin pump at  home   Diabetic retinopathy    s/p vitrectomy and history of retinal surgery   Hemorrhoid    History of oxygen administration    nocutural use only at 2 l/m nasally.   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   CORONARY ARTERY BYPASS GRAFT     4 vessel 4/11   ESOPHAGOGASTRODUODENOSCOPY (EGD) WITH PROPOFOL N/A 12/05/2014   Procedure: ESOPHAGOGASTRODUODENOSCOPY (EGD) WITH PROPOFOL;  Surgeon: Milus Banister, MD;  [facility];  Service: Endoscopy;  Laterality: N/A;   LAPAROSCOPIC APPENDECTOMY  11/24/2011   LEFT HEART CATHETERIZATION WITH CORONARY/GRAFT ANGIOGRAM N/A 02/22/2013   Procedure: LEFT HEART CATHETERIZATION WITH Beatrix Fetters;  Surgeon: Sherren Mocha, MD;  Location: [facility] CATH LAB;  Service: Cardiovascular;  Laterality: N/A;   release of right transverse carpal ligament     right eye vitrectomy and detached retina repair  10/2007   TONSILLECTOMY     uvulopalatoplasty surgery     Patient Active Problem List   Diagnosis Date Noted   CVA (cerebral vascular accident) ([place]) 08/23/2022   Confusion 08/23/2022   Acute arterial ischemic stroke, multifocal, mult vascular territories ([place]) 08/20/2022   Encephalopathy acute 08/17/2022   Uncontrolled type 1 diabetes mellitus with hyperglycemia ([name]) 0000000   Acute metabolic encephalopathy 0000000   DKA (diabetic ketoacidosis) ([place]) 08/16/2022   AKI (acute kidney injury) ([place]) 08/16/2022   Hyperkalemia 08/16/2022  Special screening for malignant neoplasms, colon    Diverticulosis of colon without hemorrhage    Toe fracture, right    Sleep apnea    Seizure disorder (HCC)    Seasonal allergic rhinitis    Myocardial infarction ([place])    Hyperlipidemia    History of oxygen administration    Hemorrhoid    Diabetes mellitus type I ([name])    Depression    Cataract    CAD (coronary artery disease)    Anxiety    Odynophagia 11/28/2014   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[name]) 03/31/2014   HTN (hypertension) 03/31/2014   Partial epilepsy with impairment of consciousness ([place]) 04/24/2013   Neurofibromatosis, 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[place]) 04/24/2013   Pain in limb 04/24/2013   Encounter for  therapeutic drug monitoring 04/24/2013   Postop check 12/08/2011   Leg pain, left 12/17/2010   HYPERLIPIDEMIA-MIXED 11/28/2009   ACUT MI SUBENDOCARDIAL INFARCT SUBSQT EPIS CARE 11/28/2009   CORONARY ATHEROSLERO AUTOL VEIN BYPASS GRAFT 11/28/2009    ONSET DATE: 08/16/2022   REFERRING DIAG: I63.9 (ICD-10-CM) - Stroke ([name])  THERAPY DIAG:  Other lack of coordination  Unsteadiness on feet  Muscle weakness (generalized)  Rationale for Evaluation and Treatment: Rehabilitation  SUBJECTIVE:                                                                                                                                                                                             SUBJECTIVE STATEMENT: He stopped aspirin per cardiologist order, but continues Plavix as instructed.  He ambulates into clinic using RW.  Pt bought RW this past Friday and says it reminds him of using a shopping cart and makes him feel more stable.   Pt accompanied by: significant other-[name] in lobby  PERTINENT HISTORY: I DM, on insulin pump at home, CAD with previous CABG and PCI, HTN, HLP, neurofibromatosis type I and seizure disorder  From Hospital note: "presented to the emergency room on 2/19 with altered mental status.  He was not feeling well for the last 24 hours.  He was found down in the bathroom.  In ED, found to be confused, slow to respond to questions, CBGs > 600 Insulin pump was removed on arrival and cannula was found to be kinked.  Due to significant findings he was admitted to intensive care unit and treated for DKA with insulin drip, IV fluids.  Troponins more than 3000.  WBC was 26,000, lactic acid was more than 9. 2/22, patient continues to improve however with intermittent confusion and difficulties with balance, MRI showed multiple areas of punctate infarction. Neurology consulted"  PRECAUTIONS: Fall  WEIGHT BEARING RESTRICTIONS: No  PAIN:  Are you having pain? No   FALLS: Has  patient fallen in last 6 months? Yes. Number of falls 3 - Pt reports falling on 2/18 prior to hospitalization   LIVING ENVIRONMENT: Lives with: lives with their spouse Lives in: House/apartment Stairs: Yes: Internal: 12 to bedroom on second floor steps; on left going up Has following equipment at home: Walker - 2 wheeled and lift chair - RW being ordered through [employer] in [place]   PLOF: Independent; on disability, was working in [place]: "To get back some strength and back to my community exercise routine."  Pt and wife are members of [employer], but have not been since prior to [place].  OBJECTIVE:  Freestyle Libre blood sugar reading at onset of session:  211 Vitals:   09/16/22 1157  BP: 125/63  Pulse: 78   TODAY'S TREATMENT:                                                                                                                               GAIT: Gait pattern: step through pattern and trunk flexed Distance walked: 210' Assistive device utilized: Environmental consultant - 2 wheeled Level of assistance: Modified independence Comments: Pt ambulates post-RW height adjustment PT drops height w/ cues to improve shoulder hike w/ pt able to maintain adjustment.  Pt continues to have intermittent minor shoulder hike bilaterally with initiation of RW use during ambulation, but adjusts over time better w/ lowered height vs prior. Discussed continued short bouts of gait in home on level surfaces w/o device to improve stamina.  Established walking program: You Can Walk For A Certain Length Of Time Each Day (Printed in 14pt font for patient visibility)                          Walk 4 minutes 1-2 times per day.             Increase 1-2  minutes every 7 days              Work up to 15-20 minutes (1-2 times per day).               Example:                         Day 1-2           4-5 minutes     3 times per day                         Day 7-8           10-12 minutes 2-3 times per day                          Day 13-14  20-22 minutes 1-2 times per day  -STS 2x8, attempted removing UE support on second round, pt remains in need of BUE support for immediate standing balance At countertop:   -SLS 2x30 seconds each LE facilitation of improved posture and glut engagement w/o trunk curve -Marches 2x30 seconds, 15 seconds rest b/w sets, pt winded following second round -Heel raises w/ eccentric 3 second lower x12, pt has right ankle inversion during heel off  PATIENT EDUCATION: Education details: Initial HEP and walking program.  Safety w/ RW approximation and use. Person educated: Patient and Spouse Education method: Explanation Education comprehension: verbalized understanding  HOME EXERCISE PROGRAM: You Can Walk For A Certain Length Of Time Each Day                          Walk 4 minutes 1-2 times per day.             Increase 1-2  minutes every 7 days              Work up to 15-20 minutes (1-2 times per day).               Example:                         Day 1-2           4-5 minutes     3 times per day                         Day 7-8           10-12 minutes 2-3 times per day                         Day 13-14       20-22 minutes 1-2 times per day  Access Code: [email or web address] URL: [email or web address] Date: 09/16/2022 Prepared by: Elease Etienne  Exercises - Sit to Stand with Armchair  - 1 x daily - 5 x weekly - 2 sets - 8 reps - Standing March with Counter Support  - 1 x daily - 5 x weekly - 3 sets - 20-30 seconds hold - Standing Single Leg Stance with Counter Support  - 1 x daily - 5 x weekly - 1 sets - 2-3 reps - 20-30 seconds hold - Heel Raises with Counter Support  - 1 x daily - 5 x weekly - 2 sets - 12 reps - 3 seconds lower hold  GOALS: Goals reviewed with patient? Yes  SHORT TERM GOALS=LONG TERM GOALS: Target date: 10/08/2022  Pt will be independent and report compliance to HEP established for functional strength and dynamic  balance. Baseline:  To be established. Goal status: INITIAL  2.  Pt will ambulate >/=800 feet over level and unlevel surfaces without AD independently to promote household and community access. Baseline: 475' Goal status: INITIAL  3.  Pt will demonstrate a gait speed of >4.0 feet/sec in order to decrease risk for falls. Baseline: 3.47 ft/sec Goal status: INITIAL  4.  Pt will perform >/=17 sit to stands in 30 seconds w/o BUE support to demonstrate decreased risk for falls and improved functional LE strength. Baseline: 17 STS w/ BUE support Goal status: INITIAL  ASSESSMENT:  CLINICAL IMPRESSION: Time spent establishing a walking program and HEP for patient this session.  He continues to be limited by cardiac endurance and  dynamic instability.  He presents w/ RW this visit which PT encouraged to use for walking program and general stability as needed, but to continue some ambulation in home without AD use to progress tolerance to activity as he is safe for short distances w/o an AD.  Will further address dynamic instability in coming sessions per ongoing skilled PT POC.  OBJECTIVE IMPAIRMENTS: Abnormal gait, decreased activity tolerance, decreased balance, and decreased endurance.   ACTIVITY LIMITATIONS: lifting, bending, stairs, transfers, and locomotion level  PARTICIPATION LIMITATIONS: community activity  PERSONAL FACTORS: Age, Fitness, Past/current experiences, and 3+ comorbidities: DMI on insulin pump, CAD w/ prior CABG, HTN, HLP, and neurofibromatosis  are also affecting patient's functional outcome.   REHAB POTENTIAL: Good  CLINICAL DECISION MAKING: Evolving/moderate complexity  EVALUATION COMPLEXITY: Moderate  PLAN:  PT FREQUENCY: 1x/week  PT DURATION: 4 weeks  PLANNED INTERVENTIONS: Therapeutic exercises, Therapeutic activity, Neuromuscular re-education, Balance training, Gait training, Patient/Family education, Self Care, Stair training, Vestibular training, DME  instructions, and Re-evaluation  PLAN FOR NEXT SESSION: Monitor blood sugar-pt has freestyle libre.  Modify HEP- dynamic balance, gait training, endurance  Bary Romelle, PT, DPT 09/16/2022, 12:59 PM

## 2022-09-16 NOTE — Patient Instructions (Addendum)
You Can Walk For A Certain Length Of Time Each Day (Printed in 14pt font for patient visibility)                          Walk 4 minutes 1-2 times per day.             Increase 1-2  minutes every 7 days              Work up to 15-20 minutes (1-2 times per day).               Example:                         Day 1-2           4-5 minutes     3 times per day                         Day 7-8           10-12 minutes 2-3 times per day                         Day 13-14       20-22 minutes 1-2 times per day  Access Code: [email or web address] URL: [email or web address] Date: 09/16/2022 Prepared by: Elease Etienne  Exercises - Sit to Stand with Armchair  - 1 x daily - 5 x weekly - 2 sets - 8 reps - Standing March with Counter Support  - 1 x daily - 5 x weekly - 3 sets - 20-30 seconds hold - Standing Single Leg Stance with Counter Support  - 1 x daily - 5 x weekly - 1 sets - 2-3 reps - 20-30 seconds hold - Heel Raises with Counter Support  - 1 x daily - 5 x weekly - 2 sets - 12 reps - 3 seconds lower hold

## 2022-09-22 ENCOUNTER — Encounter: Payer: Self-pay | Admitting: Physical Therapy

## 2022-09-22 ENCOUNTER — Ambulatory Visit: Payer: Medicare PPO | Admitting: Physical Therapy

## 2022-09-22 DIAGNOSIS — R29818 Other symptoms and signs involving the nervous system: Secondary | ICD-10-CM | POA: Diagnosis not present

## 2022-09-22 DIAGNOSIS — R278 Other lack of coordination: Secondary | ICD-10-CM | POA: Diagnosis not present

## 2022-09-22 DIAGNOSIS — M6281 Muscle weakness (generalized): Secondary | ICD-10-CM

## 2022-09-22 DIAGNOSIS — R2681 Unsteadiness on feet: Secondary | ICD-10-CM | POA: Diagnosis not present

## 2022-09-22 DIAGNOSIS — E103593 Type 1 diabetes mellitus with proliferative diabetic retinopathy without macular edema, bilateral: Secondary | ICD-10-CM | POA: Diagnosis not present

## 2022-09-22 DIAGNOSIS — R41842 Visuospatial deficit: Secondary | ICD-10-CM | POA: Diagnosis not present

## 2022-09-22 DIAGNOSIS — R208 Other disturbances of skin sensation: Secondary | ICD-10-CM | POA: Diagnosis not present

## 2022-09-22 DIAGNOSIS — I6389 Other cerebral infarction: Secondary | ICD-10-CM | POA: Diagnosis not present

## 2022-09-22 NOTE — Therapy (Unsigned)
OUTPATIENT PHYSICAL THERAPY NEURO TREATMENT   Patient Name: Jeff [name] MRN: [id number] DOB:1965/07/16, 57 y.o., male Today's Date: 09/23/2022   PCP: Jeff Infante, MD REFERRING PROVIDER: Mendel Corning, MD  END OF SESSION:  PT End of Session - 09/22/22 1108     Visit Number 3    Number of Visits 5   4 + eval   Date for PT Re-Evaluation 10/15/22   pushed out due to scheduling   Authorization Type [employer] MEDICARE    PT Start Time 1103    PT Stop Time 1145    PT Time Calculation (min) 42 min    Equipment Utilized During Treatment Gait belt    Activity Tolerance Patient tolerated treatment well    Behavior During Therapy WFL for tasks assessed/performed            Past Medical History:  Diagnosis Date   Anxiety    CAD (coronary artery disease)    Cath 2011.  LAD stent patent, distal LAD occlusion, D1 100%, OM branch 80 - 90%, SVG to diag patent, LIMA to LAD occluded, SVG to RCA occluded, SVG to OM was occluded.   No change from previous cath.  Managed medically.    Cataract    Depression    Diabetes mellitus type I ([place])    on insulin pump at  home   Diabetic retinopathy    s/p vitrectomy and history of retinal surgery   Hemorrhoid    History of oxygen administration    nocutural use only at 2 l/m nasally.   HTN (hypertension)    Hyperlipidemia    Myocardial infarction 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   CORONARY ARTERY BYPASS GRAFT     4 vessel 4/11   ESOPHAGOGASTRODUODENOSCOPY (EGD) WITH PROPOFOL N/A 12/05/2014   Procedure: ESOPHAGOGASTRODUODENOSCOPY (EGD) WITH PROPOFOL;  Surgeon: Milus Banister, MD;  Location: [facility];  Service: Endoscopy;  Laterality: N/A;   LAPAROSCOPIC APPENDECTOMY  11/24/2011   LEFT HEART CATHETERIZATION WITH CORONARY/GRAFT ANGIOGRAM N/A 02/22/2013   Procedure: LEFT HEART CATHETERIZATION WITH Beatrix Fetters;  Surgeon: Sherren Mocha, MD;  Location: [facility] CATH LAB;  Service: Cardiovascular;  Laterality: N/A;   release of right transverse carpal ligament     right eye vitrectomy and detached retina repair  10/2007   TONSILLECTOMY     uvulopalatoplasty surgery     Patient Active Problem List   Diagnosis Date Noted   CVA (cerebral vascular accident) ([place]) 08/23/2022   Confusion 08/23/2022   Acute arterial ischemic stroke, multifocal, mult vascular territories ([place]) 08/20/2022   Encephalopathy acute 08/17/2022   Uncontrolled type 1 diabetes mellitus with hyperglycemia ([place]) 0000000   Acute metabolic encephalopathy 0000000   DKA (diabetic ketoacidosis) ([place]) 08/16/2022   AKI (acute kidney injury) ([place]) 08/16/2022   Hyperkalemia 08/16/2022  Special screening for malignant neoplasms, colon    Diverticulosis of colon without hemorrhage    Toe fracture, right    Sleep apnea    Seizure disorder (HCC)    Seasonal allergic rhinitis    Myocardial infarction ([place])    Hyperlipidemia    History of oxygen administration    Hemorrhoid    Diabetes mellitus type I ([place])    Depression    Cataract    CAD (coronary artery disease)    Anxiety    Odynophagia 11/28/2014   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[place]) 03/31/2014   HTN (hypertension) 03/31/2014   Partial epilepsy with impairment of consciousness ([place]) 04/24/2013   Neurofibromatosis, 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[place]) 04/24/2013   Pain in limb 04/24/2013   Encounter for  therapeutic drug monitoring 04/24/2013   Postop check 12/08/2011   Leg pain, left 12/17/2010   HYPERLIPIDEMIA-MIXED 11/28/2009   ACUT MI SUBENDOCARDIAL INFARCT SUBSQT EPIS CARE 11/28/2009   CORONARY ATHEROSLERO AUTOL VEIN BYPASS GRAFT 11/28/2009    ONSET DATE: 08/16/2022   REFERRING DIAG: I63.9 (ICD-10-CM) - Stroke ([place])  THERAPY DIAG:  Other lack of coordination  Muscle weakness (generalized)  Unsteadiness on feet  Rationale for Evaluation and Treatment: Rehabilitation  SUBJECTIVE:                                                                                                                                                                                             SUBJECTIVE STATEMENT: He ambulates into clinic using RW.  He denies falls or near falls and he is using his RW for lengthy walks currently.  He is performing walking program as the weather permits.   Pt accompanied by: significant other-Jeff [name] waits in lobby  PERTINENT HISTORY: I DM, on insulin pump at home, CAD with previous CABG and PCI, HTN, HLP, neurofibromatosis type I and seizure disorder  From Hospital note: "presented to the emergency room on 2/19 with altered mental status.  He was not feeling well for the last 24 hours.  He was found down in the bathroom.  In ED, found to be confused, slow to respond to questions, CBGs > 600 Insulin pump was removed on arrival and cannula was found to be kinked.  Due to significant findings he was admitted to intensive care unit and treated for DKA with insulin drip, IV fluids.  Troponins more than 3000.  WBC was 26,000, lactic acid was more than 9. 2/22, patient continues to improve however with intermittent confusion and difficulties with balance, MRI showed multiple areas of punctate infarction. Neurology consulted"  PRECAUTIONS: Fall  WEIGHT BEARING RESTRICTIONS: No  PAIN:  Are you having pain? No   FALLS: Has patient fallen in last 6 months? Yes. Number of falls 3  - Pt reports falling on 2/18 prior to hospitalization   LIVING ENVIRONMENT: Lives with: lives with their spouse Lives in: House/apartment Stairs: Yes: Internal: 12 to bedroom on second floor steps; on left going up Has following equipment at home: Walker - 2 wheeled and lift chair - RW being ordered through [employer] in [place]   PLOF: Independent; on disability, was working in [name]: "To get back some strength and back to my community exercise routine."  Pt and wife are members of [employer], but have not been since prior to [place].  OBJECTIVE:  Freestyle Libre blood sugar reading at onset of session:  129 There were no vitals filed for this visit.  TODAY'S TREATMENT:                                                                                                                               NMR: -backwards walking progressing to no UE support w/ mild bilateral staggering in // bars x6, pt does not have a long enough countertop or rail or continuous hallway to add this or following tasks to HEP. -Forward progressed to backwards tandem in // bars unable to safely progress to less than BUE support due to waivering and left lean x8 -LUE only forward ambulation in // bars w/ eyes closed x5 -Forward walking w/ nods BUE support x5, pt reports most dizziness w/ looking up stating he feels this is related to his visual issues -Forward walking w/ head turns BUE support x4, pt reports symmetrical dizziness -2 laps around gym no RW intermittent CGA scanning for cone from left to right in farthest ROM and floor to eye level to promote improved environmental scanning and compensation for visual changes, increased time to promote improved scanning, LOB x2 once to left and once posteriorly  PATIENT EDUCATION: Education details: Continue HEP and discussed walking program using continuous motion and RW to focus on endurance with progression to same time without the RW as he feels safe and  steady.  Provided verbal modification to heel raise w/ eccentric lower in sitting vs standing due to countertop height variance at home and increased difficulty performing task. Person educated: Patient and Spouse Education method: Explanation Education comprehension: verbalized understanding  HOME EXERCISE PROGRAM: You Can Walk For A Certain Length Of Time Each Day                          Walk 4 minutes 1-2 times per day.             Increase 1-2  minutes every 7 days              Work up to 15-20 minutes (1-2 times per day).  Example:                         Day 1-2           4-5 minutes     3 times per day                         Day 7-8           10-12 minutes 2-3 times per day                         Day 13-14       20-22 minutes 1-2 times per day  Access Code: [email or web address] URL: [email or web address] Date: 09/16/2022 Prepared by: Elease Etienne  Exercises - Sit to Stand with Armchair  - 1 x daily - 5 x weekly - 2 sets - 8 reps - Standing March with Counter Support  - 1 x daily - 5 x weekly - 3 sets - 20-30 seconds hold - Standing Single Leg Stance with Counter Support  - 1 x daily - 5 x weekly - 1 sets - 2-3 reps - 20-30 seconds hold - Heel Raises with Counter Support  - 1 x daily - 5 x weekly - 2 sets - 12 reps - 3 seconds lower hold  GOALS: Goals reviewed with patient? Yes  SHORT TERM GOALS=LONG TERM GOALS: Target date: 10/08/2022  Pt will be independent and report compliance to HEP established for functional strength and dynamic balance. Baseline:  To be established. Goal status: INITIAL  2.  Pt will ambulate >/=800 feet over level and unlevel surfaces without AD independently to promote household and community access. Baseline: 475' Goal status: INITIAL  3.  Pt will demonstrate a gait speed of >4.0 feet/sec in order to decrease risk for falls. Baseline: 3.47 ft/sec Goal status: INITIAL  4.  Pt will perform >/=17 sit to stands in 30  seconds w/o BUE support to demonstrate decreased risk for falls and improved functional LE strength. Baseline: 17 STS w/ BUE support Goal status: INITIAL  ASSESSMENT:  CLINICAL IMPRESSION: Entirety of skilled session spent addressing dynamic stability with various ambulatory conditions.  Patient had mild dizziness with repeated head motion tasks in every direction but looking down.  He was most unstable with backwards tandem walking requiring BUE support and intermittent CGA due to staggering to both sides.  He is able to compensate for visual changes, but needs some prompting to increase ROM due to peripheral loss during scanning task at end of session.  Overall, he is doing well and remains motivated so PT to continue per POC.  OBJECTIVE IMPAIRMENTS: Abnormal gait, decreased activity tolerance, decreased balance, and decreased endurance.   ACTIVITY LIMITATIONS: lifting, bending, stairs, transfers, and locomotion level  PARTICIPATION LIMITATIONS: community activity  PERSONAL FACTORS: Age, Fitness, Past/current experiences, and 3+ comorbidities: DMI on insulin pump, CAD w/ prior CABG, HTN, HLP, and neurofibromatosis  are also affecting patient's functional outcome.   REHAB POTENTIAL: Good  CLINICAL DECISION MAKING: Evolving/moderate complexity  EVALUATION COMPLEXITY: Moderate  PLAN:  PT FREQUENCY: 1x/week  PT DURATION: 4 weeks  PLANNED INTERVENTIONS: Therapeutic exercises, Therapeutic activity, Neuromuscular re-education, Balance training, Gait training, Patient/Family education, Self Care, Stair training, Vestibular training, DME instructions, and Re-evaluation  PLAN FOR NEXT SESSION: Monitor blood sugar-pt has freestyle libre.  Modify HEP- dynamic balance, gait training, endurance  Bary Lashaun, PT, DPT 09/23/2022, 5:10 PM

## 2022-09-27 ENCOUNTER — Ambulatory Visit: Payer: Medicare PPO | Attending: Internal Medicine | Admitting: Physical Therapy

## 2022-09-27 ENCOUNTER — Ambulatory Visit: Payer: Medicare PPO | Admitting: Occupational Therapy

## 2022-09-27 ENCOUNTER — Encounter: Payer: Self-pay | Admitting: Physical Therapy

## 2022-09-27 ENCOUNTER — Encounter: Payer: Self-pay | Admitting: Occupational Therapy

## 2022-09-27 DIAGNOSIS — R208 Other disturbances of skin sensation: Secondary | ICD-10-CM | POA: Diagnosis not present

## 2022-09-27 DIAGNOSIS — I6389 Other cerebral infarction: Secondary | ICD-10-CM | POA: Diagnosis not present

## 2022-09-27 DIAGNOSIS — R29818 Other symptoms and signs involving the nervous system: Secondary | ICD-10-CM | POA: Insufficient documentation

## 2022-09-27 DIAGNOSIS — R41842 Visuospatial deficit: Secondary | ICD-10-CM | POA: Insufficient documentation

## 2022-09-27 DIAGNOSIS — R29898 Other symptoms and signs involving the musculoskeletal system: Secondary | ICD-10-CM | POA: Insufficient documentation

## 2022-09-27 DIAGNOSIS — E103593 Type 1 diabetes mellitus with proliferative diabetic retinopathy without macular edema, bilateral: Secondary | ICD-10-CM | POA: Diagnosis not present

## 2022-09-27 DIAGNOSIS — R278 Other lack of coordination: Secondary | ICD-10-CM | POA: Insufficient documentation

## 2022-09-27 DIAGNOSIS — M6281 Muscle weakness (generalized): Secondary | ICD-10-CM | POA: Insufficient documentation

## 2022-09-27 DIAGNOSIS — R2681 Unsteadiness on feet: Secondary | ICD-10-CM | POA: Insufficient documentation

## 2022-09-27 NOTE — Therapy (Signed)
OUTPATIENT OCCUPATIONAL THERAPY NEURO TREATMENT  Patient Name: Jeff [name] MRN: [id number] DOB:June 06, 1966, 57 y.o., male Today's Date: 09/27/2022  PCP: Crist Infante, MD  REFERRING PROVIDER: Mendel Corning, MD;  Gerarda Fraction, MD   END OF SESSION:  OT End of Session - 09/27/22 1107     Visit Number 3    Number of Visits 11    Date for OT Re-Evaluation 11/26/22    Authorization Type [employer] - auth required, requesting 10 visits    OT Start Time 1105    OT Stop Time 1145    OT Time Calculation (min) 40 min    Activity Tolerance Patient tolerated treatment well    Behavior During Therapy [facility] for tasks assessed/performed              Past Medical History:  Diagnosis Date   Anxiety    CAD (coronary artery disease)    Cath 2011.  LAD stent patent, distal LAD occlusion, D1 100%, OM branch 80 - 90%, SVG to diag patent, LIMA to LAD occluded, SVG to RCA occluded, SVG to OM was occluded.   No change from previous cath.  Managed medically.    Cataract    Depression    Diabetes mellitus type I    on insulin pump at  home   Diabetic retinopathy    s/p vitrectomy and history of retinal surgery   Hemorrhoid    History of oxygen administration    nocutural use only at 2 l/m nasally.   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   CORONARY ARTERY  BYPASS GRAFT     4 vessel 4/11   ESOPHAGOGASTRODUODENOSCOPY (EGD) WITH PROPOFOL N/A 12/05/2014   Procedure: ESOPHAGOGASTRODUODENOSCOPY (EGD) WITH PROPOFOL;  Surgeon: Milus Banister, MD;  [facility];  Service: Endoscopy;  Laterality: N/A;   LAPAROSCOPIC APPENDECTOMY  11/24/2011   LEFT HEART CATHETERIZATION WITH CORONARY/GRAFT ANGIOGRAM N/A 02/22/2013   Procedure: LEFT HEART CATHETERIZATION WITH Beatrix Fetters;  Surgeon: Sherren Mocha, MD;  Location[facility] CATH LAB;  Service: Cardiovascular;  Laterality: N/A;   release of right transverse carpal ligament     right eye vitrectomy and detached retina repair  10/2007   TONSILLECTOMY     uvulopalatoplasty surgery     Patient Active Problem List   Diagnosis Date Noted   CVA (cerebral vascular accident) 08/23/2022   Confusion 08/23/2022   Acute arterial ischemic stroke, multifocal, mult vascular territories 08/20/2022   Encephalopathy acute 08/17/2022   Uncontrolled type 1 diabetes mellitus with hyperglycemia 0000000   Acute metabolic encephalopathy 0000000   DKA (diabetic ketoacidosis) 08/16/2022   AKI (acute kidney injury) 08/16/2022   Hyperkalemia 08/16/2022   Special screening for malignant neoplasms, colon    Diverticulosis of colon without hemorrhage  Toe fracture, right    Sleep apnea    Seizure disorder    Seasonal allergic rhinitis    Myocardial infarction    Hyperlipidemia    History of oxygen administration    Hemorrhoid    Diabetes mellitus type I    Depression    Cataract    CAD (coronary artery disease)    Anxiety    Odynophagia 11/28/2014   DM type 2 causing eye disease 03/31/2014   HTN (hypertension) 03/31/2014   Partial epilepsy with impairment of consciousness 04/24/2013   Neurofibromatosis, type 1 (von Recklinghausen's disease) 04/24/2013   Pain in limb 04/24/2013   Encounter for therapeutic drug monitoring 04/24/2013   Postop check 12/08/2011   Leg pain, left 12/17/2010    HYPERLIPIDEMIA-MIXED 11/28/2009   ACUT MI SUBENDOCARDIAL INFARCT SUBSQT EPIS CARE 11/28/2009   CORONARY ATHEROSLERO AUTOL VEIN BYPASS GRAFT 11/28/2009    ONSET DATE: 08/15/2022  REFERRING DIAG: I63.9 (ICD-10-CM) - Ischemic cerebrovascular accident (CVA) ; E11.3599 (ICD-10-CM) - Diabetic, retinopathy, proliferative   THERAPY DIAG:  Other lack of coordination  Visuospatial deficit  Rationale for Evaluation and Treatment: Rehabilitation  SUBJECTIVE:   SUBJECTIVE STATEMENT: No pain and no recent falls reported  Pt accompanied by: self   PERTINENT HISTORY: From hospital discharge, "Patient is a 57 year old male with type I DM, on insulin pump at home, CAD with previous CABG and PCI, HTN, HLP, neurofibromatosis type I and seizure disorder who presented to the emergency room on 2/19 with altered mental status.  He was not feeling well for the last 24 hours.  He was found down in the bathroom.  In ED, found to be confused, slow to respond to questions, CBGs > 600 Insulin pump was removed on arrival and cannula was found to be kinked.  Due to significant findings he was admitted to intensive care unit and treated for DKA with insulin drip, IV fluids.  Troponins more than 3000.  WBC was 26,000, lactic acid was more than 9. 2/22, patient continues to improve however with intermittent confusion and difficulties with balance, MRI showed multiple areas of punctate infarction. Neurology consulted"  PRECAUTIONS: Fall  WEIGHT BEARING RESTRICTIONS: No  PAIN:  Are you having pain? No  FALLS: Has patient fallen in last 6 months? Yes. Number of falls 3 - Pt reports falling on 2/18 prior to hospitalization  LIVING ENVIRONMENT: Lives with: lives with their spouse Lives in: House/apartment Stairs: Yes: Internal: 12 to bedroom on second floor steps; on left going up Has following equipment at home: Walker - 2 wheeled and lift chair - RW being ordered through [employer] in [place]  PLOF: Independent; on  disability, was working in [employer]  PATIENT GOALS: Improve functional use of LUE and occupational performance with respect to visual impairments.   OBJECTIVE:   HAND DOMINANCE: Ambidextrous; writes and eats left handed  ADLs: Overall ADLs: no decline since CVA  IADLs: Overall: no reported change Handwriting:  no reported change  MOBILITY STATUS: Independent  ACTIVITY TOLERANCE: Activity tolerance: fair  FUNCTIONAL OUTCOME MEASURES: FOTO: 91 (Slight impairment)    UPPER EXTREMITY ROM:    BUE - WNL  UPPER EXTREMITY MMT:     BUE - WNL  HAND FUNCTION: Grip strength: Right: 62.3 lbs; Left: 60.1 lbs  COORDINATION: 9 Hole Peg test: Right: 28 sec; Left: 32 sec  SENSATION: WFL; some chronic paresthesias pt attributes to finger pricks  EDEMA: none observed or reported  MUSCLE TONE: WFL  COGNITION: Overall cognitive status: Within functional limits for  tasks assessed  VISION: Subjective report: more impacted by glare and font type Baseline vision:  requires use of glasses for distance and near  Visual history:  proliferative diabetic retinopathy, optic atrophy OU, cataract removal, and positive pressure ventilation  VISION ASSESSMENT: Was scheduled for low vision assessment 09/30/2022. Will further assess at that time.   PERCEPTION: Impaired: due to visual impairments  PRAXIS: Not tested  OBSERVATIONS: Pt appears well kept. Ambulates from lobby to treatment room without use of AD, no LOB. Has glasses on eyeglass strap.   TODAY'S TREATMENT:                                                                                                                               Reviewed coordination HEP - pt shown alternative options for shuffling cards including: flipping cards over and dealing cards with thumb.   Discussed strategies for watching TV including adjusting lighting, blinds, etc  (pt may also benefit from glare resistant glasses), and adjustments on phone to change  contrast and background, as well as font size to see screen easier  (**Noted word finding difficulties)  Pt copying peg design for [facility] Lt hand and visuospatial skills. Pt unable to distinguish b/t colors well, therefore placed pegs on white plate for better contrast which helped, but still w/ difficulty.  Other than occasional mistakes w/ colors, pt copied design with 100% accuracy  PATIENT EDUCATION: Education details: Pen tricks and LUE coordination HEPs Person educated: Patient Education method: Explanation, Demonstration, and Handouts Education comprehension: verbalized understanding  HOME EXERCISE PROGRAM: 09/16/22: Coordination HEP   GOALS:  SHORT TERM GOALS: Target date: 10/08/2022   Patient will demonstrate  LUE HEP with 25% verbal cues or less for proper execution. Baseline: Goal status: IN PROGRESS  2.  Patient will independently recall at least 2 compensatory strategies for visual impairment without cueing. Baseline:  Goal status: IN PROGRESS  3.  Pt to complete low vision assessment.  Baseline:  Goal status: INITIAL  4.  Patient will complete nine-hole peg with use of L in 30 seconds or less for at least 2 visits. Baseline: 30 Goal status: INITIAL   LONG TERM GOALS: Target date: 11/26/2022  Patient will demonstrate updated LUE HEP with 25% verbal cues or less for proper execution. Baseline:  Goal status: INITIAL  2.  Will set appropriate low vision goal.  Baseline:  Goal status: INITIAL  3.  Patient will report no difficulty with lifting heavy objects with use of LUE Baseline: a little Goal status: INITIAL  4.  Patient will complete FOTO at discharge. Baseline:  Goal status: INITIAL   ASSESSMENT:  CLINICAL IMPRESSION: Pt progressing towards STG's. Pt continues to benefit from O.T. for Lt hand coordination and visual strategies.    PERFORMANCE DEFICITS: in functional skills including ADLs, IADLs, coordination, strength, Fine motor control, vision,  and UE functional use.   IMPAIRMENTS: are limiting patient from ADLs, IADLs, play, leisure, and social participation.  CO-MORBIDITIES: has co-morbidities such as diabetic retinopathy  that affects occupational performance. Patient will benefit from skilled OT to address above impairments and improve overall function.  REHAB POTENTIAL: Fair given diagnoses   PLAN:  OT FREQUENCY: 1x/week  OT DURATION: 10 weeks  PLANNED INTERVENTIONS: self care/ADL training, therapeutic exercise, therapeutic activity, neuromuscular re-education, functional mobility training, ultrasound, paraffin, fluidotherapy, moist heat, patient/family education, visual/perceptual remediation/compensation, DME and/or AE instructions, and Dry needling  RECOMMENDED OTHER SERVICES: none at this time  CONSULTED AND AGREED WITH PLAN OF CARE: Patient and family member/caregiver  PLAN FOR NEXT SESSION: LOW VISION EVAL scheduled 09/30/22  Hans Eden, OT 09/27/2022, 11:08 AM

## 2022-09-27 NOTE — Therapy (Signed)
OUTPATIENT PHYSICAL THERAPY NEURO TREATMENT   Patient Name: Jeff [name] MRN: [id number] DOB:05-04-1966, 57 y.o., male Today's Date: 09/27/2022   PCP: Crist Infante, MD REFERRING PROVIDER: Mendel Corning, MD  END OF SESSION:  PT End of Session - 09/27/22 1027     Visit Number 4    Number of Visits 5   4 + eval   Date for PT Re-Evaluation 10/15/22   pushed out due to scheduling   Authorization Type [employer] MEDICARE    PT Start Time [id number]    PT Stop Time 1104    PT Time Calculation (min) 46 min    Equipment Utilized During Treatment Gait belt    Activity Tolerance Patient tolerated treatment well;Other (comment)   low blood sugar   Behavior During Therapy [facility] for tasks assessed/performed            Past Medical History:  Diagnosis Date   Anxiety    CAD (coronary artery disease)    Cath 2011.  LAD stent patent, distal LAD occlusion, D1 100%, OM branch 80 - 90%, SVG to diag patent, LIMA to LAD occluded, SVG to RCA occluded, SVG to OM was occluded.   No change from previous cath.  Managed medically.    Cataract    Depression    Diabetes mellitus type I    on insulin pump at  home   Diabetic retinopathy    s/p vitrectomy and history of retinal surgery   Hemorrhoid    History of oxygen administration    nocutural use only at 2 l/m nasally.   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   CORONARY ARTERY BYPASS GRAFT     4 vessel 4/11   ESOPHAGOGASTRODUODENOSCOPY (EGD) WITH PROPOFOL N/A 12/05/2014   Procedure: ESOPHAGOGASTRODUODENOSCOPY (EGD) WITH PROPOFOL;  Surgeon: Milus Banister, MD;  Location: [facility];  Service: Endoscopy;  Laterality: N/A;   LAPAROSCOPIC APPENDECTOMY  11/24/2011   LEFT HEART CATHETERIZATION WITH CORONARY/GRAFT ANGIOGRAM N/A 02/22/2013   Procedure: LEFT HEART CATHETERIZATION WITH Beatrix Fetters;  Surgeon: Sherren Mocha, MD;  Location: [facility] CATH LAB;  Service: Cardiovascular;  Laterality: N/A;   release of right transverse carpal ligament     right eye vitrectomy and detached retina repair  10/2007   TONSILLECTOMY     uvulopalatoplasty surgery     Patient Active Problem List   Diagnosis Date Noted   CVA (cerebral vascular accident) 08/23/2022   Confusion 08/23/2022   Acute arterial ischemic stroke, multifocal, mult vascular territories 08/20/2022   Encephalopathy acute 08/17/2022   Uncontrolled type 1 diabetes mellitus with hyperglycemia 0000000   Acute metabolic encephalopathy 0000000   DKA (diabetic ketoacidosis) 08/16/2022   AKI (acute kidney injury) 08/16/2022   Hyperkalemia 08/16/2022   Special screening  for malignant neoplasms, colon    Diverticulosis of colon without hemorrhage    Toe fracture, right    Sleep apnea    Seizure disorder    Seasonal allergic rhinitis    Myocardial infarction    Hyperlipidemia    History of oxygen administration    Hemorrhoid    Diabetes mellitus type I    Depression    Cataract    CAD (coronary artery disease)    Anxiety    Odynophagia 11/28/2014   DM type 2 causing eye disease 03/31/2014   HTN (hypertension) 03/31/2014   Partial epilepsy with impairment of consciousness 04/24/2013   Neurofibromatosis, type 1 (von Recklinghausen's disease) 04/24/2013   Pain in limb 04/24/2013   Encounter for therapeutic drug monitoring 04/24/2013    Postop check 12/08/2011   Leg pain, left 12/17/2010   HYPERLIPIDEMIA-MIXED 11/28/2009   ACUT MI SUBENDOCARDIAL INFARCT SUBSQT EPIS CARE 11/28/2009   CORONARY ATHEROSLERO AUTOL VEIN BYPASS GRAFT 11/28/2009    ONSET DATE: 08/16/2022   REFERRING DIAG: I63.9 (ICD-10-CM) - Stroke ([place])  THERAPY DIAG:  Other lack of coordination  Muscle weakness (generalized)  Unsteadiness on feet  Rationale for Evaluation and Treatment: Rehabilitation  SUBJECTIVE:                                                                                                                                                                                             SUBJECTIVE STATEMENT: Patient states he is not feeling great today as last night he had a sudden spike in his blood sugar, he attempted a bolus correction and over night his blood sugar dropped significantly and he ate this morning trying to correct it with it coming up only minimally.  Pt accompanied by: significant other-Jeff [name] in lobby  PERTINENT HISTORY: I DM, on insulin pump at home, CAD with previous CABG and PCI, HTN, HLP, neurofibromatosis type I and seizure disorder  From Hospital note: "presented to the emergency room on 2/19 with altered mental status.  He was not feeling well for the last 24 hours.  He was found down in the bathroom.  In ED, found to be confused, slow to respond to questions, CBGs > 600 Insulin pump was removed on arrival and cannula was found to be kinked.  Due to significant findings he was admitted to intensive care unit and treated for DKA with insulin drip, IV fluids.  Troponins more than 3000.  WBC was 26,000, lactic acid was more than 9. 2/22, patient continues to improve however with intermittent confusion and difficulties with balance, MRI showed multiple areas of punctate infarction. Neurology consulted"  PRECAUTIONS: Fall  WEIGHT BEARING RESTRICTIONS: No  PAIN:  Are you having pain? No   FALLS: Has patient  fallen in last 6 months? Yes. Number of falls 3 - Pt reports falling on 2/18 prior to hospitalization   LIVING ENVIRONMENT: Lives with: lives with their spouse Lives in: House/apartment Stairs: Yes: Internal: 12 to bedroom on second floor steps; on left going up Has following equipment at home: Walker - 2 wheeled and lift chair - RW being ordered through [employer] in [place]   PLOF: Independent; on disability, was working in [place]: "To get back some strength and back to my community exercise routine."  Pt and wife are members of [employer], but have not been since prior to [place].  OBJECTIVE:  Freestyle Libre blood sugar reading at onset of session:  72 -->88 at 1039 There were no vitals filed for this visit.  TODAY'S TREATMENT:                                                                                                                              Time spent providing ginger ale to patient and monitoring blood sugar to be within limits for light exercise.  Pt requesting medical tape or tegaderm to keep glucose sensor in place as prior adhesive coming up.  Provided and placed tegaderm w/ information on allergic reaction to adhesive and gentle removal if adverse reaction noted.  Pt states understanding.  NMR: Blaze pods: -3 rounds x1 minute each single color taps, tapping x3 pods on floor w/ LE and x3 pods on mirror w/ UE for reaching outside BOS, several instances of posterior stumbling when stepping off floor pods w/ CGA and pt making good recovery -3 rounds x 1 minute each single color taps w/ 2 distracting colors in same setup as prior, pt improves with each round from single incorrect tap to none in following trials, 23 > 20 > 21 taps -3 rounds x 1 minute each single color taps w/ 2 distracting colors in same setup as prior 2 trials w/ pt standing on folded blue mat for increased compliant surface challenge, single incorrect tap on initial trial, 21 > 25 > 24 *20 seconds  rest between trials, blood glucose 107 following task  PATIENT EDUCATION: Education details: Continue HEP and walking program. Person educated: Patient and Spouse Education method: Explanation Education comprehension: verbalized understanding  HOME EXERCISE PROGRAM: You Can Walk For A Certain Length Of Time Each Day                          Walk 4 minutes 1-2 times per day.             Increase 1-2  minutes every 7 days              Work up to 15-20 minutes (1-2 times per day).  Example:                         Day 1-2           4-5 minutes     3 times per day                         Day 7-8           10-12 minutes 2-3 times per day                         Day 13-14       20-22 minutes 1-2 times per day  Access Code: [email or web address] URL: [email or web address] Date: 09/16/2022 Prepared by: Elease Etienne  Exercises - Sit to Stand with Armchair  - 1 x daily - 5 x weekly - 2 sets - 8 reps - Standing March with Counter Support  - 1 x daily - 5 x weekly - 3 sets - 20-30 seconds hold - Standing Single Leg Stance with Counter Support  - 1 x daily - 5 x weekly - 1 sets - 2-3 reps - 20-30 seconds hold - Heel Raises with Counter Support  - 1 x daily - 5 x weekly - 2 sets - 12 reps - 3 seconds lower hold  GOALS: Goals reviewed with patient? Yes  SHORT TERM GOALS=LONG TERM GOALS: Target date: 10/08/2022  Pt will be independent and report compliance to HEP established for functional strength and dynamic balance. Baseline:  To be established. Goal status: INITIAL  2.  Pt will ambulate >/=800 feet over level and unlevel surfaces without AD independently to promote household and community access. Baseline: 475' Goal status: INITIAL  3.  Pt will demonstrate a gait speed of >4.0 feet/sec in order to decrease risk for falls. Baseline: 3.47 ft/sec Goal status: INITIAL  4.  Pt will perform >/=17 sit to stands in 30 seconds w/o BUE support to demonstrate decreased risk  for falls and improved functional LE strength. Baseline: 17 STS w/ BUE support Goal status: INITIAL  ASSESSMENT:  CLINICAL IMPRESSION: Session limited today due to low blood sugar at onset of session with good recovery with ginger ale provided.  Pt monitors blood glucose following activity with stable slow trend up noted.  Focus of skilled session on continued scanning and dynamic balance using blaze pods and various scenarios to challenge ankle and stepping strategies and trunk control.  He continues to be mildly challenged with these tasks with some minor LOB noted.  Will continue per POC.  OBJECTIVE IMPAIRMENTS: Abnormal gait, decreased activity tolerance, decreased balance, and decreased endurance.   ACTIVITY LIMITATIONS: lifting, bending, stairs, transfers, and locomotion level  PARTICIPATION LIMITATIONS: community activity  PERSONAL FACTORS: Age, Fitness, Past/current experiences, and 3+ comorbidities: DMI on insulin pump, CAD w/ prior CABG, HTN, HLP, and neurofibromatosis  are also affecting patient's functional outcome.   REHAB POTENTIAL: Good  CLINICAL DECISION MAKING: Evolving/moderate complexity  EVALUATION COMPLEXITY: Moderate  PLAN:  PT FREQUENCY: 1x/week  PT DURATION: 4 weeks  PLANNED INTERVENTIONS: Therapeutic exercises, Therapeutic activity, Neuromuscular re-education, Balance training, Gait training, Patient/Family education, Self Care, Stair training, Vestibular training, DME instructions, and Re-evaluation  PLAN FOR NEXT SESSION: ASSESS LTG-re-cert vs discharge?  Monitor blood sugar-pt has freestyle libre.  Modify HEP- dynamic balance, gait training, endurance  Bary Greysin, PT, DPT 09/27/2022, 1:15 PM

## 2022-09-28 DIAGNOSIS — H918X3 Other specified hearing loss, bilateral: Secondary | ICD-10-CM | POA: Diagnosis not present

## 2022-09-28 DIAGNOSIS — H6123 Impacted cerumen, bilateral: Secondary | ICD-10-CM | POA: Diagnosis not present

## 2022-09-30 ENCOUNTER — Ambulatory Visit: Payer: Medicare PPO | Admitting: Occupational Therapy

## 2022-09-30 DIAGNOSIS — R278 Other lack of coordination: Secondary | ICD-10-CM | POA: Diagnosis not present

## 2022-09-30 DIAGNOSIS — M6281 Muscle weakness (generalized): Secondary | ICD-10-CM | POA: Diagnosis not present

## 2022-09-30 DIAGNOSIS — R29898 Other symptoms and signs involving the musculoskeletal system: Secondary | ICD-10-CM | POA: Diagnosis not present

## 2022-09-30 DIAGNOSIS — R41842 Visuospatial deficit: Secondary | ICD-10-CM

## 2022-09-30 DIAGNOSIS — R29818 Other symptoms and signs involving the nervous system: Secondary | ICD-10-CM | POA: Diagnosis not present

## 2022-09-30 DIAGNOSIS — E103593 Type 1 diabetes mellitus with proliferative diabetic retinopathy without macular edema, bilateral: Secondary | ICD-10-CM

## 2022-09-30 DIAGNOSIS — I6389 Other cerebral infarction: Secondary | ICD-10-CM | POA: Diagnosis not present

## 2022-09-30 DIAGNOSIS — R2681 Unsteadiness on feet: Secondary | ICD-10-CM | POA: Diagnosis not present

## 2022-09-30 DIAGNOSIS — R208 Other disturbances of skin sensation: Secondary | ICD-10-CM | POA: Diagnosis not present

## 2022-09-30 NOTE — Therapy (Signed)
OUTPATIENT OCCUPATIONAL THERAPY LOW VISION VISIT  Patient Name: Jeff [name] MRN: [id number] DOB:[birth year], 57 y.o., male Today's Date: 09/30/2022  PCP:  Rodrigo Ran, MD  REFERRING PROVIDER: Cathren Harsh, MD;  Marvis Repress, MD   END OF SESSION:  OT End of Session - 09/30/22 1451     Visit Number 4    Number of Visits 11    Date for OT Re-Evaluation 11/26/22    Authorization Type [employer] - auth required, requesting 10 visits    OT Start Time 1450    Activity Tolerance Patient tolerated treatment well    Behavior During Therapy [facility] for tasks assessed/performed             Past Medical History:  Diagnosis Date   Anxiety    CAD (coronary artery disease)    Cath 2011.  LAD stent patent, distal LAD occlusion, D1 100%, OM branch 80 - 90%, SVG to diag patent, LIMA to LAD occluded, SVG to RCA occluded, SVG to OM was occluded.   No change from previous cath.  Managed medically.    Cataract    Depression    Diabetes mellitus type I    on insulin pump at  home   Diabetic retinopathy    s/p vitrectomy and history of retinal surgery   Hemorrhoid    History of oxygen administration    nocutural use only at 2 l/m nasally.   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   CORONARY ARTERY BYPASS GRAFT     4 vessel 4/11    ESOPHAGOGASTRODUODENOSCOPY (EGD) WITH PROPOFOL N/A 12/05/2014   Procedure: ESOPHAGOGASTRODUODENOSCOPY (EGD) WITH PROPOFOL;  Surgeon: [name], MD;  Location: [facility];  Service: Endoscopy;  Laterality: N/A;   LAPAROSCOPIC APPENDECTOMY  11/24/2011   LEFT HEART CATHETERIZATION WITH CORONARY/GRAFT ANGIOGRAM N/A 02/22/2013   Procedure: LEFT HEART CATHETERIZATION WITH [name];  Surgeon: Tonny Bollman, MD;  Location: [facility] CATH LAB;  Service: Cardiovascular;  Laterality: N/A;   release of right transverse carpal ligament     right eye vitrectomy and detached retina repair  10/2007   TONSILLECTOMY     uvulopalatoplasty surgery     Patient Active Problem List   Diagnosis Date Noted   CVA (cerebral vascular accident) 08/23/2022   Confusion 08/23/2022   Acute arterial ischemic stroke, multifocal, mult vascular territories 08/20/2022   Encephalopathy acute 08/17/2022   Uncontrolled type 1 diabetes mellitus with hyperglycemia 08/17/2022   Acute metabolic encephalopathy 08/17/2022   DKA (diabetic ketoacidosis) 08/16/2022   AKI (acute kidney injury) 08/16/2022   Hyperkalemia 08/16/2022   Special screening for malignant neoplasms, colon    Diverticulosis of colon without hemorrhage    Toe fracture, right    Sleep apnea    Seizure  disorder    Seasonal allergic rhinitis    Myocardial infarction    Hyperlipidemia    History of oxygen administration    Hemorrhoid    Diabetes mellitus type I    Depression    Cataract    CAD (coronary artery disease)    Anxiety    Odynophagia 11/28/2014   DM type 2 causing eye disease 03/31/2014   HTN (hypertension) 03/31/2014   Partial epilepsy with impairment of consciousness 04/24/2013   Neurofibromatosis, type 1 (von Recklinghausen's disease) 04/24/2013   Pain in limb 04/24/2013   Encounter for therapeutic drug monitoring 04/24/2013   Postop check 12/08/2011   Leg pain, left 12/17/2010   HYPERLIPIDEMIA-MIXED 11/28/2009   ACUT MI  SUBENDOCARDIAL INFARCT SUBSQT EPIS CARE 11/28/2009   CORONARY ATHEROSLERO AUTOL VEIN BYPASS GRAFT 11/28/2009      ONSET DATE: ~1990  REFERRING DIAG: I63.9 (ICD-10-CM) - Ischemic cerebrovascular accident (CVA) ; E11.3599 (ICD-10-CM) - Diabetic, retinopathy, proliferative   THERAPY DIAG:  Visuospatial deficit  Proliferative diabetic retinopathy of both eyes associated with type 1 diabetes mellitus, unspecified proliferative retinopathy type  Rationale for Evaluation and Treatment: Rehabilitation  SUBJECTIVE:   SUBJECTIVE STATEMENT: Patient reports in 95 or 96 his visual acuity started to decline.  He has been able to keep his central vision.  His right eye is generally worse than his left.  He has had 2 DMV license appeals initially restricting him to no night or highway driving.  His most recent appeal he was told he is right on the cusp.  Patient reports he is able to generally read his text messages but the text reactions/emoji's are much smaller and he has difficulty identifying which ones are sent.  He generally is able to see things on his laptop dependent upon size and style of fall especially in emails.  He is having difficulty reading scores and banner text across TV screen.  He also has difficulty with glare from [employer].  In general he does feel his vision is better with an increase into light.  He uses the freestyle libre 3 app for management of blood sugar as well as manual glucose meter (cannot read date and time) and a glucose pump.  He reports difficulty reading the pump screen.  He is inquired with the manufacturer of the pump to see if there are different versions.  Pt accompanied by: self  PERTINENT HISTORY: Patient referred by ophthalmologist after follow-up for type 1 diabetes with proliferative retinopathy of both eyes.  This visit was 08/12/2022 and prior to his hospitalization 08/16/2022  PAIN:  Are you having pain?  None reported  FALLS: Has patient fallen in  last 6 months? Yes. Number of falls 3 - Pt reports falling on 2/18 prior to hospitalization  LIVING ENVIRONMENT: Lives with: lives with their spouse Lives in: House/apartment Stairs: Yes: Internal: 12 to bedroom on second floor steps; on left going up Has following equipment at home: Walker - 2 wheeled and lift chair - RW being ordered through [employer] in [place]   PLOF: Independent; on disability, was working in [employer]  PATIENT GOALS: Learn how to cope with his visual impairments  OBJECTIVE:   GLASSES: Readers-+3.25 Sunglasses-no prescription or special lenses; reports he seldomly wears i.e. such as after dilation of his eyes  CURRENT MODIFICATIONS/ADAPTIONS:  Large text on phone  DEVICES: Hand-held pocket magnifier with light 5X magnification Hand-held magnifier with 50 X magnification Computer PC laptop Flatscreen TV (not smart)  PHONE: Galaxy s23  HAND DOMINANCE: Ambidextrous;  writes and eats left handed   ADLs: Overall ADLs: no decline since CVA   IADLs: Overall: no reported change Handwriting:  no reported change   MOBILITY STATUS: Independent   ACTIVITY TOLERANCE: Activity tolerance: fair   FUNCTIONAL OUTCOME MEASURES: FOTO: 91 (Slight impairment)      UPPER EXTREMITY ROM:     BUE - WNL   UPPER EXTREMITY MMT:      BUE - WNL   HAND FUNCTION: Grip strength: Right: 62.3 lbs; Left: 60.1 lbs   COORDINATION: 9 Hole Peg test: Right: 28 sec; Left: 32 sec   SENSATION: WFL; some chronic paresthesias pt attributes to finger pricks   EDEMA: none observed or reported   MUSCLE TONE: WFL   COGNITION: Overall cognitive status: Within functional limits for tasks assessed   VISION: Subjective report: more impacted by glare and font type Baseline vision:  requires use of glasses for distance and near  Visual history:  proliferative diabetic retinopathy, optic atrophy OU, cataract removal, and positive pressure ventilation  Per ophthalmology note:  Distance  acuity without correction on the right: 20/800  left: 20/100 +1 Visual fields impaired bilaterally and equally, lacks peripheral vision  OBSERVATIONS: Pt appears well kept. Ambulates from lobby to treatment room without use of AD, no LOB. Has glasses on eyeglass strap.    TODAY'S TREATMENT:                                                                                                                               OT completed further assessment of vision following neurology evaluation completed 09/09/2022 as patient had originally been scheduled for low vision evaluation at this time prior to his stroke.  During this time, OT educated patient on various modifications including phone, computer, use of magnifiers, and request for additional adaptive devices such as insulin pump and glucose meter.  PATIENT EDUCATION: Education details: OT Role and POC for low vision referral Person educated: Patient Education method: Explanation  Education comprehension: verbalized understanding and needs further education  HOME EXERCISE PROGRAM: 09/16/22: Coordination HEP    GOALS:  SHORT TERM GOALS: Target date: 10/08/2022     Patient will demonstrate  LUE HEP with 25% verbal cues or less for proper execution. Baseline: Goal status: IN PROGRESS   2.  Patient will independently recall at least 2 compensatory strategies for visual impairment without cueing. Baseline:  Goal status: IN PROGRESS   3.  Pt to complete low vision assessment.  Baseline:  Goal status: MET   4.  Patient will complete nine-hole peg with use of L in 30 seconds or less for at least 2 visits. Baseline: 30 Goal status: INITIAL     LONG TERM GOALS: Target date: 11/26/2022   Patient will demonstrate updated LUE HEP with 25% verbal cues or less for proper execution. Baseline:  Goal status: INITIAL   2. Patient will return demonstration of visual adaptation use and verbalize understanding of how to  obtain item(s) if  desired.  Baseline:  Goal status: INITIAL   3.  Patient will report no difficulty with lifting heavy objects with use of LUE Baseline: a little Goal status: INITIAL   4.  Patient will complete FOTO at discharge. Baseline:  Goal status: INITIAL  ASSESSMENT:   CLINICAL IMPRESSION: Pt to benefit from additional skilled OT services to maximize independence and safety with ADLs and IADLs following his stroke but also due to his chronic low vision.   PERFORMANCE DEFICITS: in functional skills including ADLs, IADLs, coordination, strength, Fine motor control, vision, and UE functional use.    IMPAIRMENTS: are limiting patient from ADLs, IADLs, play, leisure, and social participation.    CO-MORBIDITIES: has co-morbidities such as diabetic retinopathy  that affects occupational performance. Patient will benefit from skilled OT to address above impairments and improve overall function.   REHAB POTENTIAL: Fair given diagnoses     PLAN:   OT FREQUENCY: 1x/week   OT DURATION: 10 weeks   PLANNED INTERVENTIONS: self care/ADL training, therapeutic exercise, therapeutic activity, neuromuscular re-education, functional mobility training, ultrasound, paraffin, fluidotherapy, moist heat, patient/family education, visual/perceptual remediation/compensation, DME and/or AE instructions, and Dry needling   RECOMMENDED OTHER SERVICES: none at this time   CONSULTED AND AGREED WITH PLAN OF CARE: Patient and family member/caregiver  PLAN FOR NEXT SESSION: Go over low vision packet and identify modifications for device usage  Delana Meyer, OT 09/30/2022, 2:53 PM

## 2022-10-01 ENCOUNTER — Encounter: Payer: Self-pay | Admitting: Occupational Therapy

## 2022-10-04 ENCOUNTER — Ambulatory Visit: Payer: Medicare PPO | Admitting: Occupational Therapy

## 2022-10-04 ENCOUNTER — Encounter: Payer: Self-pay | Admitting: Occupational Therapy

## 2022-10-04 DIAGNOSIS — M6281 Muscle weakness (generalized): Secondary | ICD-10-CM

## 2022-10-04 DIAGNOSIS — R2681 Unsteadiness on feet: Secondary | ICD-10-CM | POA: Diagnosis not present

## 2022-10-04 DIAGNOSIS — R41842 Visuospatial deficit: Secondary | ICD-10-CM

## 2022-10-04 DIAGNOSIS — R29818 Other symptoms and signs involving the nervous system: Secondary | ICD-10-CM | POA: Diagnosis not present

## 2022-10-04 DIAGNOSIS — R29898 Other symptoms and signs involving the musculoskeletal system: Secondary | ICD-10-CM | POA: Diagnosis not present

## 2022-10-04 DIAGNOSIS — R278 Other lack of coordination: Secondary | ICD-10-CM

## 2022-10-04 DIAGNOSIS — R208 Other disturbances of skin sensation: Secondary | ICD-10-CM

## 2022-10-04 DIAGNOSIS — E103593 Type 1 diabetes mellitus with proliferative diabetic retinopathy without macular edema, bilateral: Secondary | ICD-10-CM | POA: Diagnosis not present

## 2022-10-04 DIAGNOSIS — I6389 Other cerebral infarction: Secondary | ICD-10-CM | POA: Diagnosis not present

## 2022-10-04 NOTE — Therapy (Incomplete)
OUTPATIENT OCCUPATIONAL THERAPY LOW VISION VISIT  Patient Name: Jeff GrumblingRichard N [name] MRN: [id number] DOB:02/06/1966, 57 y.o., male Today's Date: 10/04/2022  PCP:  Rodrigo RanPerini, Mark, MD  REFERRING PROVIDER: Cathren Harshai, Ripudeep K, MD;  Marvis RepressGreven, Craig, MD   END OF SESSION:  OT End of Session - 10/04/22 1455     Visit Number 5    Number of Visits 11    Date for OT Re-Evaluation 11/26/22    Authorization Type [employer] - auth required, requesting 10 visits    OT Start Time 1454    OT Stop Time 1532    OT Time Calculation (min) 38 min    Activity Tolerance Patient tolerated treatment well    [facility] for tasks assessed/performed             Past Medical History:  Diagnosis Date   Anxiety    CAD (coronary artery disease)    Cath 2011.  LAD stent patent, distal LAD occlusion, D1 100%, OM branch 80 - 90%, SVG to diag patent, LIMA to LAD occluded, SVG to RCA occluded, SVG to OM was occluded.   No change from previous cath.  Managed medically.    Cataract    Depression    Diabetes mellitus type I    on insulin pump at  home   Diabetic retinopathy    s/p vitrectomy and history of retinal surgery   Hemorrhoid    History of oxygen administration    nocutural use only at 2 l/m nasally.   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   CORONARY ARTERY  BYPASS GRAFT     4 vessel 4/11   ESOPHAGOGASTRODUODENOSCOPY (EGD) WITH PROPOFOL N/A 12/05/2014   Procedure: ESOPHAGOGASTRODUODENOSCOPY (EGD) WITH PROPOFOL;  Surgeon: [name], MD;  Location: [facility];  Service: Endoscopy;  Laterality: N/A;   LAPAROSCOPIC APPENDECTOMY  11/24/2011   LEFT HEART CATHETERIZATION WITH CORONARY/GRAFT ANGIOGRAM N/A 02/22/2013   Procedure: LEFT HEART CATHETERIZATION WITH Isabel CapriceORONARY/GRAFT ANGIOGRAM;  Surgeon: Tonny BollmanMichael Cooper, MD;  [facility];  Service: Cardiovascular;  Laterality: N/A;   release of right transverse carpal ligament     right eye vitrectomy and detached retina repair  10/2007   TONSILLECTOMY     uvulopalatoplasty surgery     Patient Active Problem List   Diagnosis Date Noted   CVA (cerebral vascular accident) 08/23/2022   Confusion 08/23/2022   Acute arterial ischemic stroke, multifocal, mult vascular territories 08/20/2022   Encephalopathy acute 08/17/2022   Uncontrolled type 1 diabetes mellitus with hyperglycemia 08/17/2022   Acute metabolic encephalopathy 08/17/2022   DKA (diabetic ketoacidosis) 08/16/2022   AKI (acute kidney injury) 08/16/2022   Hyperkalemia 08/16/2022   Special screening for malignant neoplasms, colon    Diverticulosis of colon without  hemorrhage    Toe fracture, right    Sleep apnea    Seizure disorder    Seasonal allergic rhinitis    Myocardial infarction    Hyperlipidemia    History of oxygen administration    Hemorrhoid    Diabetes mellitus type I    Depression    Cataract    CAD (coronary artery disease)    Anxiety    Odynophagia 11/28/2014   DM type 2 causing eye disease 03/31/2014   HTN (hypertension) 03/31/2014   Partial epilepsy with impairment of consciousness 04/24/2013   Neurofibromatosis, type 1 (von Recklinghausen's disease) 04/24/2013   Pain in limb 04/24/2013   Encounter for therapeutic drug monitoring 04/24/2013   Postop check 12/08/2011   Leg pain, left 12/17/2010    HYPERLIPIDEMIA-MIXED 11/28/2009   ACUT MI SUBENDOCARDIAL INFARCT SUBSQT EPIS CARE 11/28/2009   CORONARY ATHEROSLERO AUTOL VEIN BYPASS GRAFT 11/28/2009      ONSET DATE: ~1990  REFERRING DIAG: I63.9 (ICD-10-CM) - Ischemic cerebrovascular accident (CVA) ; E11.3599 (ICD-10-CM) - Diabetic, retinopathy, proliferative   THERAPY DIAG:  Visuospatial deficit  Other lack of coordination  Muscle weakness (generalized)  Other disturbances of skin sensation  Other symptoms and signs involving the nervous system  Rationale for Evaluation and Treatment: Rehabilitation  SUBJECTIVE:   SUBJECTIVE STATEMENT: Pt reports his blood sugar has been high late at night/early morning.   Pt accompanied by: self  PERTINENT HISTORY: Patient referred by ophthalmologist after follow-up for type 1 diabetes with proliferative retinopathy of both eyes.  This visit was 08/12/2022 and prior to his hospitalization 08/16/2022  PAIN:  Are you having pain?  None reported  FALLS: Has patient fallen in last 6 months? Yes. Number of falls 3 - Pt reports falling on 2/18 prior to hospitalization  LIVING ENVIRONMENT: Lives with: lives with their spouse Lives in: House/apartment Stairs: Yes: Internal: 12 to bedroom on second floor steps; on left going up Has following equipment at home: Walker - 2 wheeled and lift chair - RW being ordered through [employer] in [place]   PLOF: Independent; on disability, was working in [employer]  PATIENT GOALS: Learn how to cope with his visual impairments  OBJECTIVE:   GLASSES: Readers-+3.25 Sunglasses-no prescription or special lenses; reports he seldomly wears i.e. such as after dilation of his eyes  CURRENT MODIFICATIONS/ADAPTIONS:  Large text on phone  DEVICES: Hand-held pocket magnifier with light 5X magnification Hand-held magnifier with 50 X magnification Computer PC laptop Flatscreen TV (not smart)  PHONE: Galaxy s23  HAND DOMINANCE: Ambidextrous; writes and eats left  handed   ADLs: Overall ADLs: no decline since CVA   IADLs: Overall: no reported change Handwriting:  no reported change   MOBILITY STATUS: Independent   ACTIVITY TOLERANCE: Activity tolerance: fair   FUNCTIONAL OUTCOME MEASURES: FOTO: 91 (Slight impairment)      UPPER EXTREMITY ROM:     BUE - WNL   UPPER EXTREMITY MMT:      BUE - WNL   HAND FUNCTION: Grip strength: Right: 62.3 lbs; Left: 60.1 lbs   COORDINATION: 9 Hole Peg test: Right: 28 sec; Left: 32 sec   SENSATION: WFL; some chronic paresthesias pt attributes to finger pricks   EDEMA: none observed or reported   MUSCLE TONE: WFL   COGNITION: Overall cognitive status: Within functional limits for tasks assessed   VISION: Subjective report: more impacted by glare and font type Baseline vision:  requires use of glasses for distance and near  Visual history:  proliferative diabetic retinopathy,  optic atrophy OU, cataract removal, and positive pressure ventilation  Per ophthalmology note:  Distance acuity without correction on the right: 20/800  left: 20/100 +1 Visual fields impaired bilaterally and equally, lacks peripheral vision  OBSERVATIONS: Pt appears well kept. Ambulates from lobby to treatment room without use of AD, no LOB. Has glasses on eyeglass strap.    TODAY'S TREATMENT:                                                                                                                               OT educated pt on general low vision recommendations as noted in pt instructions including use of yellow fit in lenses for reading and amber or plum non polarized fit over sunglasses to increase contrast and reduce glas with functional tasks.   PATIENT EDUCATION: Education details: Low vision recommendations.  Person educated: Patient Education method: Explanation  Education comprehension: verbalized understanding and needs further education  HOME EXERCISE PROGRAM: 09/16/22: Coordination HEP  4/8:  low vision recommendations  GOALS:  SHORT TERM GOALS: Target date: 10/08/2022     Patient will demonstrate  LUE HEP with 25% verbal cues or less for proper execution. Baseline: Goal status: IN PROGRESS   2.  Patient will independently recall at least 2 compensatory strategies for visual impairment without cueing. Baseline:  Goal status: IN PROGRESS   3.  Pt to complete low vision assessment.  Baseline:  Goal status: MET   4.  Patient will complete nine-hole peg with use of L in 30 seconds or less for at least 2 visits. Baseline: 30 Goal status: INITIAL     LONG TERM GOALS: Target date: 11/26/2022   Patient will demonstrate updated LUE HEP with 25% verbal cues or less for proper execution. Baseline:  Goal status: INITIAL   2. Patient will return demonstration of visual adaptation use and verbalize understanding of how to obtain item(s) if desired.  Baseline:  Goal status: INITIAL   3.  Patient will report no difficulty with lifting heavy objects with use of LUE Baseline: a little Goal status: INITIAL   4.  Patient will complete FOTO at discharge. Baseline:  Goal status: INITIAL  ASSESSMENT:   CLINICAL IMPRESSION: Pt to benefit from additional skilled OT services to maximize independence and safety with ADLs and IADLs following his stroke but also due to his chronic low vision.   PERFORMANCE DEFICITS: in functional skills including ADLs, IADLs, coordination, strength, Fine motor control, vision, and UE functional use.    IMPAIRMENTS: are limiting patient from ADLs, IADLs, play, leisure, and social participation.    CO-MORBIDITIES: has co-morbidities such as diabetic retinopathy  that affects occupational performance. Patient will benefit from skilled OT to address above impairments and improve overall function.   REHAB POTENTIAL: Fair given diagnoses     PLAN:   OT FREQUENCY: 1x/week   OT DURATION: 10 weeks   PLANNED INTERVENTIONS: self care/ADL training,  therapeutic exercise, therapeutic activity, neuromuscular re-education,  functional mobility training, ultrasound, paraffin, fluidotherapy, moist heat, patient/family education, visual/perceptual remediation/compensation, DME and/or AE instructions, and Dry needling   RECOMMENDED OTHER SERVICES: none at this time   CONSULTED AND AGREED WITH PLAN OF CARE: Patient and family member/caregiver  PLAN FOR NEXT SESSION: LUE coordination and strengthening  Delana Meyer, OT 10/04/2022, 2:56 PM

## 2022-10-05 ENCOUNTER — Ambulatory Visit: Payer: Medicare PPO | Admitting: Occupational Therapy

## 2022-10-05 ENCOUNTER — Encounter: Payer: Self-pay | Admitting: Physical Therapy

## 2022-10-05 ENCOUNTER — Ambulatory Visit: Payer: Medicare PPO | Admitting: Neurology

## 2022-10-05 ENCOUNTER — Ambulatory Visit: Payer: Medicare PPO | Admitting: Physical Therapy

## 2022-10-05 ENCOUNTER — Encounter: Payer: Self-pay | Admitting: Neurology

## 2022-10-05 ENCOUNTER — Encounter: Payer: Self-pay | Admitting: Occupational Therapy

## 2022-10-05 VITALS — BP 117/68 | HR 90 | Ht 71.0 in | Wt 233.0 lb

## 2022-10-05 DIAGNOSIS — I63513 Cerebral infarction due to unspecified occlusion or stenosis of bilateral middle cerebral arteries: Secondary | ICD-10-CM | POA: Diagnosis not present

## 2022-10-05 DIAGNOSIS — E103593 Type 1 diabetes mellitus with proliferative diabetic retinopathy without macular edema, bilateral: Secondary | ICD-10-CM | POA: Diagnosis not present

## 2022-10-05 DIAGNOSIS — R208 Other disturbances of skin sensation: Secondary | ICD-10-CM

## 2022-10-05 DIAGNOSIS — R29898 Other symptoms and signs involving the musculoskeletal system: Secondary | ICD-10-CM | POA: Diagnosis not present

## 2022-10-05 DIAGNOSIS — R2681 Unsteadiness on feet: Secondary | ICD-10-CM | POA: Diagnosis not present

## 2022-10-05 DIAGNOSIS — R278 Other lack of coordination: Secondary | ICD-10-CM

## 2022-10-05 DIAGNOSIS — M6281 Muscle weakness (generalized): Secondary | ICD-10-CM | POA: Diagnosis not present

## 2022-10-05 DIAGNOSIS — R29818 Other symptoms and signs involving the nervous system: Secondary | ICD-10-CM | POA: Diagnosis not present

## 2022-10-05 DIAGNOSIS — R41842 Visuospatial deficit: Secondary | ICD-10-CM | POA: Diagnosis not present

## 2022-10-05 DIAGNOSIS — Q8501 Neurofibromatosis, type 1: Secondary | ICD-10-CM

## 2022-10-05 DIAGNOSIS — I6389 Other cerebral infarction: Secondary | ICD-10-CM | POA: Diagnosis not present

## 2022-10-05 MED ORDER — CLOPIDOGREL BISULFATE 75 MG PO TABS: 75.0000 mg | ORAL_TABLET | Freq: Every day | ORAL | 3 refills | Status: DC

## 2022-10-05 NOTE — Patient Instructions (Addendum)
Continue current medications  Continue with PT  Continue to follow up with your doctors  Return in 6 months or sooner if worse

## 2022-10-05 NOTE — Patient Instructions (Signed)
Upper Body Strengthening Exercises  Comments  Sit upright, away from the back of your chair. Keep abdominal muscles engaged i.e. pull belly button to spine.  Repeat 10 Times  Hold 3 Seconds  Complete 1 Set  Perform 2 Times a Day   FOR ALL EXERCISES:   ELASTIC BAND FLEXION   Place unaffected arm on your leg or hip. With your affected arm, hold elastic band in front of you and pull the band upward towards the ceiling as shown.       ELASTIC BAND HORIZONTAL ABDUCTION - SCAPULAR RETRACTION  Start by holding elastic band in front of your chest with your elbows straight. Then, pull your arms apart and towards the side while squeezing your shoulder blades together. Return to starting position and repeat.            ELASTIC BAND TRICEPS EXTENSION  While seated, hold and fixate one end of an elastic band against your chest. Hold the other end with your opposite hand with your elbow bent and arm by your side.   Start by pulling the band downward so that the elbow goes from a bent position to a straightened position as shown. Return to starting position and repeat.                    BICEPS CURL WITH BAND  While sitting in an upright position, put an elastic band underneath your feet (as pictured)  OR underneath your thighs. Grip each end of the band, keep the elbows tucked to the body, and bring hands to shoulders by bending at the elbows.                    

## 2022-10-05 NOTE — Therapy (Signed)
OUTPATIENT OCCUPATIONAL THERAPY NEUROLOGY VISIT  Patient Name: Jeff [name] MRN: [id number] DOB:03/21/1966, 57 y.o., male Today's Date: 10/05/2022  PCP:  Rodrigo RanPerini, Mark, MD  REFERRING PROVIDER: Cathren Harshai, Ripudeep K, MD;  Marvis RepressGreven, Craig, MD   END OF SESSION:  OT End of Session - 10/05/22 1019     Visit Number 6    Number of Visits 11    Date for OT Re-Evaluation 11/26/22    Authorization Type [employer] - auth required, requesting 10 visits    OT Start Time 1019    OT Stop Time 1100    OT Time Calculation (min) 41 min    Activity Tolerance Patient tolerated treatment well    Behavior During Therapy [facility] for tasks assessed/performed             Past Medical History:  Diagnosis Date   Anxiety    CAD (coronary artery disease)    Cath 2011.  LAD stent patent, distal LAD occlusion, D1 100%, OM branch 80 - 90%, SVG to diag patent, LIMA to LAD occluded, SVG to RCA occluded, SVG to OM was occluded.   No change from previous cath.  Managed medically.    Cataract    Depression    Diabetes mellitus type I    on insulin pump at  home   Diabetic retinopathy    s/p vitrectomy and history of retinal surgery   Hemorrhoid    History of oxygen administration    nocutural use only at 2 l/m nasally.   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   CORONARY ARTERY  BYPASS GRAFT     4 vessel 4/11   ESOPHAGOGASTRODUODENOSCOPY (EGD) WITH PROPOFOL N/A 12/05/2014   Procedure: ESOPHAGOGASTRODUODENOSCOPY (EGD) WITH PROPOFOL;  Surgeon: [name], MD;  Location: [facility];  Service: Endoscopy;  Laterality: N/A;   LAPAROSCOPIC APPENDECTOMY  11/24/2011   LEFT HEART CATHETERIZATION WITH CORONARY/GRAFT ANGIOGRAM N/A 02/22/2013   Procedure: LEFT HEART CATHETERIZATION WITH Isabel CapriceORONARY/GRAFT ANGIOGRAM;  Surgeon: Tonny BollmanMichael Cooper, MD;  [facility];  Service: Cardiovascular;  Laterality: N/A;   release of right transverse carpal ligament     right eye vitrectomy and detached retina repair  10/2007   TONSILLECTOMY     uvulopalatoplasty surgery     Patient Active Problem List   Diagnosis Date Noted   CVA (cerebral vascular accident) 08/23/2022   Confusion 08/23/2022   Acute arterial ischemic stroke, multifocal, mult vascular territories 08/20/2022   Encephalopathy acute 08/17/2022   Uncontrolled type 1 diabetes mellitus with hyperglycemia 08/17/2022   Acute metabolic encephalopathy 08/17/2022   DKA (diabetic ketoacidosis) 08/16/2022   AKI (acute kidney injury) 08/16/2022   Hyperkalemia 08/16/2022   Special screening for malignant neoplasms, colon    Diverticulosis of colon without hemorrhage  Toe fracture, right    Sleep apnea    Seizure disorder    Seasonal allergic rhinitis    Myocardial infarction    Hyperlipidemia    History of oxygen administration    Hemorrhoid    Diabetes mellitus type I    Depression    Cataract    CAD (coronary artery disease)    Anxiety    Odynophagia 11/28/2014   DM type 2 causing eye disease 03/31/2014   HTN (hypertension) 03/31/2014   Partial epilepsy with impairment of consciousness 04/24/2013   Neurofibromatosis, type 1 (von Recklinghausen's disease) 04/24/2013   Pain in limb 04/24/2013   Encounter for therapeutic drug monitoring 04/24/2013   Postop check 12/08/2011   Leg pain, left 12/17/2010    HYPERLIPIDEMIA-MIXED 11/28/2009   ACUT MI SUBENDOCARDIAL INFARCT SUBSQT EPIS CARE 11/28/2009   CORONARY ATHEROSLERO AUTOL VEIN BYPASS GRAFT 11/28/2009    ONSET DATE: ~1990  REFERRING DIAG: I63.9 (ICD-10-CM) - Ischemic cerebrovascular accident (CVA) ; E11.3599 (ICD-10-CM) - Diabetic, retinopathy, proliferative   THERAPY DIAG:  Other lack of coordination  Muscle weakness (generalized)  Visuospatial deficit  Other disturbances of skin sensation  Other symptoms and signs involving the nervous system  Rationale for Evaluation and Treatment: Rehabilitation  SUBJECTIVE:   SUBJECTIVE STATEMENT: Pt reports he has been looking for sunglasses online.   Pt accompanied by: self  PERTINENT HISTORY: Patient referred by ophthalmologist after follow-up for type 1 diabetes with proliferative retinopathy of both eyes.  This visit was 08/12/2022 and prior to his hospitalization 08/16/2022  PAIN:  Are you having pain?  3/10 R shoulder  FALLS: Has patient fallen in last 6 months? Yes. Number of falls 3 - Pt reports falling on 2/18 prior to hospitalization  LIVING ENVIRONMENT: Lives with: lives with their spouse Lives in: House/apartment Stairs: Yes: Internal: 12 to bedroom on second floor steps; on left going up Has following equipment at home: Walker - 2 wheeled and lift chair - RW being ordered through [employer] in [place]   PLOF: Independent; on disability, was working in [employer]  PATIENT GOALS: Learn how to cope with his visual impairments  OBJECTIVE:   GLASSES: Readers-+3.25 Sunglasses-no prescription or special lenses; reports he seldomly wears i.e. such as after dilation of his eyes  CURRENT MODIFICATIONS/ADAPTIONS:  Large text on phone  DEVICES: Hand-held pocket magnifier with light 5X magnification Hand-held magnifier with 50 X magnification Computer PC laptop Flatscreen TV (not smart)  PHONE: Galaxy s23  HAND DOMINANCE: Ambidextrous; writes and eats left handed    ADLs: Overall ADLs: no decline since CVA   IADLs: Overall: no reported change Handwriting:  no reported change   MOBILITY STATUS: Independent   ACTIVITY TOLERANCE: Activity tolerance: fair   FUNCTIONAL OUTCOME MEASURES: FOTO: 91 (Slight impairment)      UPPER EXTREMITY ROM:     BUE - WNL   UPPER EXTREMITY MMT:      BUE - WNL   HAND FUNCTION: Grip strength: Right: 62.3 lbs; Left: 60.1 lbs   COORDINATION: 9 Hole Peg test: Right: 28 sec; Left: 32 sec   SENSATION: WFL; some chronic paresthesias pt attributes to finger pricks   EDEMA: none observed or reported   MUSCLE TONE: WFL   COGNITION: Overall cognitive status: Within functional limits for tasks assessed   VISION: Subjective report: more impacted by glare and font type Baseline vision:  requires use of glasses for distance and near  Visual history:  proliferative diabetic retinopathy, optic atrophy OU, cataract removal, and positive pressure  ventilation  Per ophthalmology note:  Distance acuity without correction on the right: 20/800  left: 20/100 +1 Visual fields impaired bilaterally and equally, lacks peripheral vision  OBSERVATIONS: Pt appears well kept. Ambulates from lobby to treatment room without use of AD, no LOB. Has glasses on eyeglass strap.    TODAY'S TREATMENT:                                                                                                                               - Therapeutic exercises completed for duration as noted below including:  OT initiated LUE red Thera-Band HEP including shoulder flexion, horizontal abd/add and green theraband bicep curl, and tricep extension to promote strengthening of affected extremity and overall endurance as noted in pt instructions.   Pt completed putty search of 4 pennies in red putty using L hand for strength, coordination, and sensation.   PATIENT EDUCATION: Education details: LUE red (shoulders) green (triceps and biceps)  theraband Person educated: Patient Education method: Explanation, handouts  Education comprehension: verbalized understanding, returned demonstration, and needs further education  HOME EXERCISE PROGRAM: 09/16/22: Coordination HEP  4/8: low vision recommendations 10/05/2022: LUE theraband  GOALS:  SHORT TERM GOALS: Target date: 10/08/2022     Patient will demonstrate  LUE HEP with 25% verbal cues or less for proper execution. Baseline: Goal status: IN PROGRESS   2.  Patient will independently recall at least 2 compensatory strategies for visual impairment without cueing. Baseline:  Goal status: IN PROGRESS   3.  Pt to complete low vision assessment.  Baseline:  Goal status: MET   4.  Patient will complete nine-hole peg with use of L in 30 seconds or less for at least 2 visits. Baseline: 30 Goal status: INITIAL     LONG TERM GOALS: Target date: 11/26/2022   Patient will demonstrate updated LUE HEP with 25% verbal cues or less for proper execution. Baseline:  Goal status: INITIAL   2. Patient will return demonstration of visual adaptation use and verbalize understanding of how to obtain item(s) if desired.  Baseline:  Goal status: INITIAL   3.  Patient will report no difficulty with lifting heavy objects with use of LUE Baseline: a little Goal status: INITIAL   4.  Patient will complete FOTO at discharge. Baseline:  Goal status: INITIAL  ASSESSMENT:   CLINICAL IMPRESSION: Pt to benefit from additional skilled OT services to maximize independence and safety with ADLs and IADLs following his stroke but also due to his chronic low vision.   PERFORMANCE DEFICITS: in functional skills including ADLs, IADLs, coordination, strength, Fine motor control, vision, and UE functional use.    IMPAIRMENTS: are limiting patient from ADLs, IADLs, play, leisure, and social participation.    CO-MORBIDITIES: has co-morbidities such as diabetic retinopathy  that affects occupational  performance. Patient will benefit from skilled OT to address above impairments and improve overall function.   REHAB POTENTIAL: Fair given diagnoses  PLAN:   OT FREQUENCY: 1x/week   OT DURATION: 10 weeks   PLANNED INTERVENTIONS: self care/ADL training, therapeutic exercise, therapeutic activity, neuromuscular re-education, functional mobility training, ultrasound, paraffin, fluidotherapy, moist heat, patient/family education, visual/perceptual remediation/compensation, DME and/or AE instructions, and Dry needling   RECOMMENDED OTHER SERVICES: none at this time   CONSULTED AND AGREED WITH PLAN OF CARE: Patient and family member/caregiver  PLAN FOR NEXT SESSION: review L red theraband; LUE coordination and strengthening  Delana Meyer, OT 10/05/2022, 12:04 PM

## 2022-10-05 NOTE — Therapy (Signed)
OUTPATIENT PHYSICAL THERAPY NEURO TREATMENT/DISCHARGE SUMMARY   Patient Name: Jeff [name] MRN: [id number] DOB:01/10/1966, 57 y.o., male Today's Date: 10/05/2022   PCP: Rodrigo RanPerini, Mark, MD REFERRING PROVIDER: Cathren Harshai, Ripudeep K, MD  PHYSICAL THERAPY DISCHARGE SUMMARY  Visits from Start of Care: 5  Current functional level related to goals / functional outcomes: See clinical impression statement.   Remaining deficits: Mild limp - pre-existing, no excess instability w/o AD from this; decreased endurance   Education / Equipment: Continue HEP and walking program.  Plan for D/C.  See start of session for further.   Patient agrees to discharge. Patient goals were partially met. Patient is being discharged due to being pleased with the current functional level.   END OF SESSION:  PT End of Session - 10/05/22 1110     Visit Number 5    Number of Visits 5    Date for PT Re-Evaluation 10/15/22    Authorization Type [employer] MEDICARE    PT Start Time 1103    PT Stop Time 1149    PT Time Calculation (min) 46 min    Equipment Utilized During Treatment Gait belt    Activity Tolerance Patient tolerated treatment well    Behavior During Therapy WFL for tasks assessed/performed            Past Medical History:  Diagnosis Date   Anxiety    CAD (coronary artery disease)    Cath 2011.  LAD stent patent, distal LAD occlusion, D1 100%, OM branch 80 - 90%, SVG to diag patent, LIMA to LAD occluded, SVG to RCA occluded, SVG to OM was occluded.   No change from previous cath.  Managed medically.    Cataract    Depression    Diabetes mellitus type I    on insulin pump at  home   Diabetic retinopathy    s/p vitrectomy and history of retinal surgery   Hemorrhoid    History of oxygen administration    nocutural use only at 2 l/m nasally.   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   CORONARY ARTERY BYPASS GRAFT     4 vessel 4/11   ESOPHAGOGASTRODUODENOSCOPY (EGD) WITH PROPOFOL N/A 12/05/2014   Procedure: ESOPHAGOGASTRODUODENOSCOPY (EGD) WITH PROPOFOL;  Surgeon: [name], MD;  Location: [facility];  Service: Endoscopy;  Laterality: N/A;   LAPAROSCOPIC APPENDECTOMY  11/24/2011   LEFT HEART CATHETERIZATION WITH CORONARY/GRAFT ANGIOGRAM N/A 02/22/2013   Procedure: LEFT HEART CATHETERIZATION WITH Isabel CapriceORONARY/GRAFT ANGIOGRAM;  Surgeon: Tonny BollmanMichael Cooper, MD;  [facility];  Service: Cardiovascular;  Laterality: N/A;   release of right transverse carpal ligament     right eye vitrectomy and detached retina repair  10/2007   TONSILLECTOMY     uvulopalatoplasty surgery  Patient Active Problem List   Diagnosis Date Noted   CVA (cerebral vascular accident) 08/23/2022   Confusion 08/23/2022   Acute arterial ischemic stroke, multifocal, mult vascular territories 08/20/2022   Encephalopathy acute 08/17/2022   Uncontrolled type 1 diabetes mellitus with hyperglycemia 08/17/2022   Acute metabolic encephalopathy 08/17/2022   DKA (diabetic ketoacidosis) 08/16/2022   AKI (acute kidney injury) 08/16/2022   Hyperkalemia 08/16/2022   Special screening for malignant neoplasms, colon    Diverticulosis of colon without hemorrhage    Toe fracture, right    Sleep apnea    Seizure disorder    Seasonal allergic rhinitis    Myocardial infarction     Hyperlipidemia    History of oxygen administration    Hemorrhoid    Diabetes mellitus type I    Depression    Cataract    CAD (coronary artery disease)    Anxiety    Odynophagia 11/28/2014   DM type 2 causing eye disease 03/31/2014   HTN (hypertension) 03/31/2014   Partial epilepsy with impairment of consciousness 04/24/2013   Neurofibromatosis, type 1 (von Recklinghausen's disease) 04/24/2013   Pain in limb 04/24/2013   Encounter for therapeutic drug monitoring 04/24/2013   Postop check 12/08/2011   Leg pain, left 12/17/2010   HYPERLIPIDEMIA-MIXED 11/28/2009   ACUT MI SUBENDOCARDIAL INFARCT SUBSQT EPIS CARE 11/28/2009   CORONARY ATHEROSLERO AUTOL VEIN BYPASS GRAFT 11/28/2009    ONSET DATE: 08/16/2022   REFERRING DIAG: I63.9 (ICD-10-CM) - Stroke (HCC)  THERAPY DIAG:  Other lack of coordination  Muscle weakness (generalized)  Unsteadiness on feet  Rationale for Evaluation and Treatment: Rehabilitation  SUBJECTIVE:                                                                                                                                                                                             SUBJECTIVE STATEMENT: Patient states he is feeling good today.  Verbalizes things he feels he is doing well at home, but laundry and carrying tasks still "wind" him a bit.  Overall, he feels he is doing good and does not need the RW all the time. Pt accompanied by: significant other-[name] in lobby  PERTINENT HISTORY: I DM, on insulin pump at home, CAD with previous CABG and PCI, HTN, HLP, neurofibromatosis type I and seizure disorder  From Hospital note: "presented to the emergency room on 2/19 with altered mental status.  He was not feeling well for the last 24 hours.  He was found down in the bathroom.  In ED, found to be confused, slow to respond to questions, CBGs > 600 Insulin pump was removed on  arrival and cannula was found to be kinked.  Due to significant findings  he was admitted to intensive care unit and treated for DKA with insulin drip, IV fluids.  Troponins more than 3000.  WBC was 26,000, lactic acid was more than 9. 2/22, patient continues to improve however with intermittent confusion and difficulties with balance, MRI showed multiple areas of punctate infarction. Neurology consulted"  PRECAUTIONS: Fall  WEIGHT BEARING RESTRICTIONS: No  PAIN:  Are you having pain? No   FALLS: Has patient fallen in last 6 months? Yes. Number of falls 3 - Pt reports falling on 2/18 prior to hospitalization   LIVING ENVIRONMENT: Lives with: lives with their spouse Lives in: House/apartment Stairs: Yes: Internal: 12 to bedroom on second floor steps; on left going up Has following equipment at home: Walker - 2 wheeled and lift chair - RW being ordered through [employer] in [place]   PLOF: Independent; on disability, was working in [employer]  PATIENT GOALS: "To get back some strength and back to my community exercise routine."  Pt and wife are members of [employer], but have not been since prior to COVID.  OBJECTIVE:  Freestyle Libre blood sugar reading at onset of session:  130 There were no vitals filed for this visit.  TODAY'S TREATMENT:                                                                                                                              Time spent with patient recounting current functional level in home and follow-ups in the works.  Discussed ACT in partnership with [employer] as benefit to patient slowly progressing endurance, maintaining walking program and HEP as jumping off point for strengthening regimen.  Encouraged maintaining consistent activity in some capacity safely with slow progression using safe equipment like treadmill, stepper, higher sitting leg press w/ various leg positions for strengthening.  Discussed HEP w/ progressions and form and using functional activities like stairs and STS to maintain leg strength.  Reprinted  HEP and walking program per pt request.  - no AD:  7.22 seconds = 1.39 m/sec OR 4.57 ft/sec -30 second chair stand no UE support:  15 stands -800' no AD IND w/ good scanning and obstacle navigation over level surfaces only-assessment limited by weather  PATIENT EDUCATION: Education details: Continue HEP and walking program.  Plan for D/C.  See above for further. Person educated: Patient and Spouse Education method: Explanation Education comprehension: verbalized understanding  HOME EXERCISE PROGRAM: You Can Walk For A Certain Length Of Time Each Day                          Walk 4 minutes 1-2 times per day.             Increase 1-2  minutes every 7 days              Work up to 15-20 minutes (  1-2 times per day).               Example:                         Day 1-2           4-5 minutes     3 times per day                         Day 7-8           10-12 minutes 2-3 times per day                         Day 13-14       20-22 minutes 1-2 times per day  Access Code: [email or web address] URL: [email or web address] Date: 09/16/2022 Prepared by: Camille Bal  Exercises - Sit to Stand with Armchair  - 1 x daily - 5 x weekly - 2 sets - 8 reps - Standing March with Counter Support  - 1 x daily - 5 x weekly - 3 sets - 20-30 seconds hold - Standing Single Leg Stance with Counter Support  - 1 x daily - 5 x weekly - 1 sets - 2-3 reps - 20-30 seconds hold - Heel Raises with Counter Support  - 1 x daily - 5 x weekly - 2 sets - 12 reps - 3 seconds lower hold  GOALS: Goals reviewed with patient? Yes  SHORT TERM GOALS=LONG TERM GOALS: Target date: 10/08/2022  Pt will be independent and report compliance to HEP established for functional strength and dynamic balance. Baseline:  Patient independent and planning to transition back into community senior gym program. (4/9) Goal status: MET  2.  Pt will ambulate >/=800 feet over level and unlevel surfaces without AD independently to  promote household and community access. Baseline: 475'; 800' no AD IND w/ good scanning and obstacle navigation over level surfaces only-assessment limited by weather (4/9) Goal status: PARTIALLY MET  3.  Pt will demonstrate a gait speed of >4.0 feet/sec in order to decrease risk for falls. Baseline: 3.47 ft/sec; 4.57 ft/sec no AD (4/9) Goal status: MET  4.  Pt will perform >/=17 sit to stands in 30 seconds w/o BUE support to demonstrate decreased risk for falls and improved functional LE strength. Baseline: 17 STS w/ BUE support; 15 STS no UE support Goal status: PARTIALLY MET  ASSESSMENT:  CLINICAL IMPRESSION: Assessed long-term goals this session with patient meeting or making significant progress towards all 4 goals as written.  He ambulates 800' without AD independently in clinic and demonstrates good awareness of need to increase scanning efforts when ambulating around obstacles in the clinic, no PT correction needed.  His walking speed w/o an AD is WNL compared to age matched adults at a speed of 4.57 ft/sec.  He is unable to achieve 17 STS without UE support, but does complete 15 demonstrating good mechanics and minor dyspnea on exertion w/ adequate recovery.  Discussed continued work on this to strength legs and promote dynamic power with walking tasks.  He is independent with his current HEP and is hoping to progress to more outdoor walking as the weather warms up.  Patient also verbalizes plan to return to community based fitness with discussion today of appropriate and safe progressions.  At this time, patient is appropriate to and in agreement with plan for discharge.  OBJECTIVE IMPAIRMENTS: Abnormal  gait, decreased activity tolerance, decreased balance, and decreased endurance.   ACTIVITY LIMITATIONS: lifting, bending, stairs, transfers, and locomotion level  PARTICIPATION LIMITATIONS: community activity  PERSONAL FACTORS: Age, Fitness, Past/current experiences, and 3+  comorbidities: DMI on insulin pump, CAD w/ prior CABG, HTN, HLP, and neurofibromatosis  are also affecting patient's functional outcome.   REHAB POTENTIAL: Good  CLINICAL DECISION MAKING: Evolving/moderate complexity  EVALUATION COMPLEXITY: Moderate  PLAN:  PT FREQUENCY: 1x/week  PT DURATION: 4 weeks  PLANNED INTERVENTIONS: Therapeutic exercises, Therapeutic activity, Neuromuscular re-education, Balance training, Gait training, Patient/Family education, Self Care, Stair training, Vestibular training, DME instructions, and Re-evaluation  PLAN FOR NEXT SESSION: N/A  Sadie Haber, PT, DPT 10/05/2022, 11:49 AM

## 2022-10-05 NOTE — Progress Notes (Unsigned)
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 Cannot remember last seizure. Pt stated that the seizure activity hasb't occurred during his waking hours in months     HISTORY OF PRESENT ILLNESS:  This is a 57 year old gentleman with past medical history of diabetes type 1 on insulin, CAD, hypertension, hyperlipidemia, neurofibromatosis type I and seizure disorder who is presenting after being admitted to the hospital for DKA and found to have multiple strokes.  Patient presented to the hospital on February 19 due to altered mental status.  At that time he was found to be in severe DKA.  DKA was due to his insulin pump malfunction.  He was treated appropriately, but he was still found to have altered mental status, MRI brain was obtained and patient was found to have multiple punctate bilateral acute and subacute strokes.  Stroke etiology was secondary to dehydration due to DKA.  He was recommended DAPT for 21 days and continue with Plavix indefinitely.  He completed DAPT and currently on Plavix.  He denies any deficit, feel like he is back to his baseline.   Hospital course and summary  Patient is a 57 year old male with type I DM, on insulin pump at home, CAD with previous CABG and PCI, HTN, HLP, neurofibromatosis type I and seizure disorder who presented to the emergency room on 2/19 with altered mental status.  He was not feeling well for the last 24 hours.  He was found down in the bathroom.  In ED, found to be confused, slow to respond to questions, CBGs > 600 Insulin pump was removed on  arrival and cannula was found to be kinked.  Due to significant findings he was admitted to intensive care unit and treated for DKA with insulin drip, IV fluids.  Troponins more than 3000. WBC was 26,000, lactic acid was more than 9. 2/22, patient continues to improve however with intermittent confusion and difficulties with balance, MRI showed multiple areas of punctate infarction. Neurology consulted Acute metabolic encephalopathy Acute arterial ischemic stroke, multifocal, mult vascular territories [facility](HCC) -CT head on 2/19 without acute findings. -MRI brain showed multiple punctate foci of abnormal diffusion restriction scattered within both cerebral hemispheres, consistent with acute to subacute infarct, no hemorrhage or mass effect.  Unchanged left scalp neurofibroma.   -CTA head and neck showed no emergent large vessel occlusion or hemodynamically significant stenosis of head and neck -2D echo showed EF 60 to 65%, G1 DD, borderline dilatation of aortic root measuring 37 mm -EEG normal, no seizures or epileptiform discharges - PT evaluation recommended home health PT OT Lipid panel showed triglycerides 157, LDL 25, HDL 39 - Neurology recommend DAPT, discussed with Dr. Pearlean BrownieSethi, recommended aspirin 81 mg daily and Plavix for 21 days, then continue Plavix indefinitely -Continue statin  OTHER MEDICAL CONDITIONS: NF1, DM I, CAD, HTN, HLD, Seizures    REVIEW OF SYSTEMS: Full 14 system review of systems performed and negative with exception of: As noted in the HPI   ALLERGIES: Allergies  Allergen Reactions   Doxepin Hcl     Other reaction(s): did not  work and had some extra nocturia   Venlafaxine     Other reaction(s): Too much sedation   Latex Rash    HOME MEDICATIONS: Outpatient Medications Prior to Visit  Medication Sig Dispense Refill   ACCU-CHEK GUIDE test strip      Accu-Chek Softclix Lancets lancets      acetaminophen (TYLENOL) 500 MG tablet Take 1,000 mg by mouth every 6 (six)  hours as needed for mild pain.     Alirocumab (PRALUENT) 150 MG/ML SOAJ Inject 150 mg into the skin every 14 (fourteen) days. Every other Tuesday     Cholecalciferol (VITAMIN D) 2000 units CAPS Take 2,000 Units by mouth daily.      Continuous Blood Gluc Sensor (FREESTYLE LIBRE 3 SENSOR) MISC 1 Device by Does not apply route as needed (check blood glucose as needed/directed). Check blood glucose as needed/directed     cyanocobalamin 1000 MCG tablet Take 1,000 mcg by mouth daily.     desvenlafaxine (PRISTIQ) 100 MG 24 hr tablet Take 100 mg by mouth daily.     docusate sodium (COLACE) 100 MG capsule Take 100 mg by mouth at bedtime.     ezetimibe (ZETIA) 10 MG tablet Take 10 mg by mouth at bedtime.     insulin aspart (NOVOLOG) 100 UNIT/ML injection Inject 100 Units into the skin. Use per pump as needed     isosorbide mononitrate (IMDUR) 60 MG 24 hr tablet Take 60 mg by mouth every morning.      levETIRAcetam (KEPPRA) 750 MG tablet TAKE 1 TABLET(750 MG) BY MOUTH TWICE DAILY 180 tablet 1   lisinopril (PRINIVIL,ZESTRIL) 5 MG tablet Take 5 mg by mouth every morning.      metoprolol tartrate (LOPRESSOR) 25 MG tablet TAKE 1 TABLET(25 MG) BY MOUTH TWICE DAILY 180 tablet 2   Multiple Vitamins-Minerals (CENTRUM SILVER 50+MEN) TABS Take 1 tablet by mouth every morning.     nitroGLYCERIN (NITROSTAT) 0.4 MG SL tablet Place 1 tablet (0.4 mg total) under the tongue every 5 (five) minutes as needed for chest pain. 25 tablet 5   OXYGEN Place 2 L into the nose at bedtime.     promethazine (PHENERGAN) 25 MG tablet Take 25 mg by mouth every 6 (six) hours as needed for nausea or vomiting.     rosuvastatin (CRESTOR) 40 MG tablet Take 40 mg by mouth at bedtime.      tamsulosin (FLOMAX) 0.4 MG CAPS capsule Take 1 capsule (0.4 mg total) by mouth daily. 30 capsule 3   TEGRETOL-XR 200 MG 12 hr tablet TAKE 3 TABLETS BY MOUTH TWO TIMES A DAY. 540 tablet 3   clopidogrel (PLAVIX) 75 MG tablet Take 1 tablet (75 mg total) by mouth  daily. 90 tablet 2   ADVOCATE INSULIN SYRINGE 31G X 5/16" 1 ML MISC  (Patient not taking: Reported on 10/05/2022)     No facility-administered medications prior to visit.    PAST MEDICAL HISTORY: Past Medical History:  Diagnosis Date   Anxiety    CAD (coronary artery disease)    Cath 2011.  LAD stent patent, distal LAD occlusion, D1 100%, OM branch 80 - 90%, SVG to diag patent, LIMA to LAD occluded, SVG to RCA occluded, SVG to OM was occluded.   No change from previous cath.  Managed medically.    Cataract    Depression    Diabetes mellitus type I    on insulin pump at  home   Diabetic retinopathy    s/p vitrectomy and history  of retinal surgery   Hemorrhoid    History of oxygen administration    nocutural use only at 2 l/m nasally.   HTN (hypertension)    Hyperlipidemia    Myocardial infarction    Neurofibromatosis    with neurofibroma lesion at the base of the skull   Seasonal allergic rhinitis    Seizure disorder    Sleep apnea    s/p oral surgery   Stroke    Toe fracture, right    11-23-14 fracture right big toe-wearing a stability shoe    PAST SURGICAL HISTORY: Past Surgical History:  Procedure Laterality Date   APPENDECTOMY     CARDIAC CATHETERIZATION     CATARACT EXTRACTION, BILATERAL     COLONOSCOPY WITH PROPOFOL N/A 07/21/2017   Procedure: COLONOSCOPY WITH PROPOFOL;  Surgeon: [name], MD;  Location: [facility];  Service: Endoscopy;  Laterality: N/A;   CORONARY ANGIOPLASTY     9'11,  CABPG grafts had failed.   CORONARY ARTERY BYPASS GRAFT     4 vessel 4/11   ESOPHAGOGASTRODUODENOSCOPY (EGD) WITH PROPOFOL N/A 12/05/2014   Procedure: ESOPHAGOGASTRODUODENOSCOPY (EGD) WITH PROPOFOL;  Surgeon: [name], MD;  Location: [facility];  Service: Endoscopy;  Laterality: N/A;   LAPAROSCOPIC APPENDECTOMY  11/24/2011   LEFT HEART CATHETERIZATION WITH CORONARY/GRAFT ANGIOGRAM N/A 02/22/2013   Procedure: LEFT HEART CATHETERIZATION WITH [name];   Surgeon: Tonny Bollman, MD;  Location: [facility] CATH LAB;  Service: Cardiovascular;  Laterality: N/A;   release of right transverse carpal ligament     right eye vitrectomy and detached retina repair  10/2007   TONSILLECTOMY     uvulopalatoplasty surgery      FAMILY HISTORY: Family History  Problem Relation Age of Onset   Coronary artery disease Father 3   Diabetes Father        type II   Hypertension Father    Squamous cell carcinoma Brother    Arthritis Sister    Crohn's disease Mother    Hearing loss Mother     SOCIAL HISTORY: Social History   Socioeconomic History   Marital status: Married    Spouse name: Almyra Free   Number of children: 0   Years of education: college   Highest education level: Not on file  Occupational History   Occupation: Unemployed    Comment: previously worked in Airline pilot  Tobacco Use   Smoking status: Never   Smokeless tobacco: Never  Building services engineer Use: Never used  Substance and Sexual Activity   Alcohol use: No   Drug use: No   Sexual activity: Yes  Other Topics Concern   Not on file  Social History Narrative   Patient lives with his wifeLanora Manis).   Patient does not have any children.   Patient is on disability.   Patient is left-handed (writes), but right hand with sports.   Patient has a college education.   Patient drinks 5-6 cups of caffeine daily.   Social Determinants of Health   Financial Resource Strain: Not on file  Food Insecurity: No Food Insecurity (08/16/2022)   Hunger Vital Sign    Worried About Running Out of Food in the Last Year: Never true    Ran Out of Food in the Last Year: Never true  Transportation Needs: No Transportation Needs (08/26/2022)   PRAPARE - Administrator, Civil Service (Medical): No    Lack of Transportation (Non-Medical): No  Physical Activity: Not on file  Stress: Not on  file  Social Connections: Not on file  Intimate Partner Violence: Not At Risk (08/16/2022)   Humiliation,  Afraid, Rape, and Kick questionnaire    Fear of Current or Ex-Partner: No    Emotionally Abused: No    Physically Abused: No    Sexually Abused: No     PHYSICAL EXAM  GENERAL EXAM/CONSTITUTIONAL: Vitals:  Vitals:   10/05/22 1307  BP: 117/68  Pulse: 90  Weight: 233 lb (105.7 kg)  Height:  (1.803 m)   Body mass index is 32.5 kg/m. Wt Readings from Last 3 Encounters:  10/05/22 233 lb (105.7 kg)  09/13/22 225 lb 12.8 oz (102.4 kg)  08/23/22 233 lb 6.4 oz (105.9 kg)   Patient is in no distress; well developed, nourished and groomed; neck is supple  EYES: Constricted vision field, loss of both peripheral vision, Extraocular movements intacts,   MUSCULOSKELETAL: Gait, strength, tone, movements noted in Neurologic exam below  NEUROLOGIC: MENTAL STATUS:      No data to display         awake, alert, oriented to person, place and time recent and remote memory intact normal attention and concentration language fluent, comprehension intact, naming intact fund of knowledge appropriate  CRANIAL NERVE:  2nd, 3rd, 4th, 6th - Constricted vision field, loss of both peripheral vision, extraocular muscles intact, no nystagmus 5th - facial sensation symmetric 7th - facial strength symmetric 8th - hearing intact 9th - palate elevates symmetrically, uvula midline 11th - shoulder shrug symmetric 12th - tongue protrusion midline  MOTOR:  normal bulk and tone, full strength in the BUE, BLE  SENSORY:  normal and symmetric to light touch  COORDINATION:  finger-nose-finger, fine finger movements normal  REFLEXES:  deep tendon reflexes present and symmetric  GAIT/STATION:  normal   DIAGNOSTIC DATA (LABS, IMAGING, TESTING) - I reviewed patient records, labs, notes, testing and imaging myself where available.  Lab Results  Component Value Date   WBC 11.7 (H) 08/18/2022   HGB 12.9 (L) 08/18/2022   HCT 37.3 (L) 08/18/2022   MCV 89.9 08/18/2022   PLT 160 08/18/2022       Component Value Date/Time   NA 139 08/22/2022 0116   NA 141 04/22/2021 1602   K 3.6 08/22/2022 0116   CL 104 08/22/2022 0116   CO2 23 08/22/2022 0116   GLUCOSE 70 08/22/2022 0116   BUN 6 08/22/2022 0116   BUN 9 04/22/2021 1602   CREATININE 0.66 08/22/2022 0116   CALCIUM 8.5 (L) 08/22/2022 0116   PROT 5.7 (L) 08/16/2022 0035   PROT 6.1 04/22/2021 1602   ALBUMIN 3.5 08/16/2022 0035   ALBUMIN 4.3 04/22/2021 1602   AST 50 (H) 08/16/2022 0035   ALT 35 08/16/2022 0035   ALKPHOS 89 08/16/2022 0035   BILITOT 0.9 08/16/2022 0035   BILITOT 0.3 04/22/2021 1602   GFRNONAA >60 08/22/2022 0116   GFRAA >60 03/09/2018 1707   Lab Results  Component Value Date   CHOL 95 08/21/2022   HDL 39 (L) 08/21/2022   LDLCALC 25 08/21/2022   TRIG 157 (H) 08/21/2022   CHOLHDL 2.4 08/21/2022   Lab Results  Component Value Date   HGBA1C 7.4 (H) 08/16/2022   No results found for: "VITAMINB12" Lab Results  Component Value Date   TSH 3.749 08/16/2022   MRI Brain 08/19/2022 1. Multiple punctate foci of abnormal diffusion restriction scattered within both cerebral hemispheres, consistent with acute to subacute infarcts. No hemorrhage or mass effect. 2. Findings of chronic microvascular ischemia  and volume loss. 3. Unchanged left scalp lesion, likely neurofibroma    ASSESSMENT AND PLAN  57 y.o. year old male with history of diabetes type 1 on insulin, CAD, hypertension, hyperlipidemia, neurofibromatosis type I and seizure disorder who is presenting after being admitted to the hospital for DKA and found to have punctate Bi-hemispheric strokes.  Stroke etiology likely dehydration in the setting of DKA.  He has completed DAPT for 21 days and currently on aspirin Plavix.  Denies any deficit from his strokes.  Plan for patient to continue current medications including Plavix.  In terms of the NF1, her MRI shows stable neurofibromas.   1. Cerebrovascular accident (CVA) due to bilateral occlusion of  middle cerebral arteries   2. Neurofibromatosis, type 1      Patient Instructions  Continue current medications  Continue with PT  Continue to follow up with your doctors  Return in 6 months or sooner if worse   No orders of the defined types were placed in this encounter.   Meds ordered this encounter  Medications   clopidogrel (PLAVIX) 75 MG tablet    Sig: Take 1 tablet (75 mg total) by mouth daily.    Dispense:  90 tablet    Refill:  3    Return in about 6 months (around 04/06/2023).    Windell Norfolk, MD 10/06/2022, 9:40 AM  [facility] [address], [address] [place], [place] [address] [phone]

## 2022-10-06 ENCOUNTER — Encounter: Payer: Self-pay | Admitting: Occupational Therapy

## 2022-10-06 ENCOUNTER — Encounter: Payer: Self-pay | Admitting: Neurology

## 2022-10-06 ENCOUNTER — Other Ambulatory Visit: Payer: Self-pay

## 2022-10-07 ENCOUNTER — Ambulatory Visit: Payer: Self-pay

## 2022-10-08 NOTE — Patient Instructions (Signed)
Visit Information  Thank you for taking time to visit with me today. Please don't hesitate to contact me if I can be of assistance to you.   Following are the goals we discussed today:   Goals Addressed             This Visit's Progress    To improve stamina and balance       Care Coordination Interventions: Evaluation of current treatment plan related to impaired physical mobility and patient's adherence to plan as established by provider Determined patient continues to work with outpatient PT/OT with good effectiveness  Counseled on importance of adhering to his prescribed HEP        To reduce risk of hyperglycemia       Care Coordination Interventions: Discussed and reviewed with patient recent follow up with his CDE  Determined patient was instructed on parameters to help guide him on changing out his insulin pump cannula, patient reports having 1 episode of cannula change Determined patient's CBG's are running in a good range since being home from the hospital, he denies having further concerns related to his dm at this time Reviewed next scheduled PCP follow up with Dr. Waynard Edwards on 11/25/22 @2 :15 PM, Tommy Medal 12/01/22 @2 :30 PM         Our next appointment is by telephone on 11/29/22 at 1:00 PM  Please call the care guide team at [phone] if you need to cancel or reschedule your appointment.   If you are experiencing a Mental Health or Behavioral Health Crisis or need someone to talk to, please call 1-800-273-TALK (toll free, 24 hour hotline) go to [facility] [address] [place] [phone])  Patient verbalizes understanding of instructions and care plan provided today and agrees to view in MyChart. Active MyChart status and patient understanding of how to access instructions and care plan via MyChart confirmed with patient.     Delsa Sale, RN, BSN, CCM Care Management Coordinator [facility] Care Management Direct Phone:  [phone]

## 2022-10-08 NOTE — Patient Outreach (Signed)
  Care Coordination   Follow Up Visit Note   10/07/2022 Name: Jeff [name] MRN: [id number] DOB: Apr 22, 1966  Jeff [name] is a 57 y.o. year old male who sees Rodrigo Ran, MD for primary care. I spoke with  Jeff [name] by phone today.  What matters to the patients health and wellness today?  Patient will continue to work with PT and OT to help with mobility, stamina and endurance. He will follow his diabetes plan of care as directed.     Goals Addressed             This Visit's Progress    To improve stamina and balance       Care Coordination Interventions: Evaluation of current treatment plan related to impaired physical mobility and patient's adherence to plan as established by provider Determined patient continues to work with outpatient PT/OT with good effectiveness  Counseled on importance of adhering to his prescribed HEP        To reduce risk of hyperglycemia       Care Coordination Interventions: Discussed and reviewed with patient recent follow up with his CDE  Determined patient was instructed on parameters to help guide him on changing out his insulin pump cannula, patient reports having 1 episode of cannula change Determined patient's CBG's are running in a good range since being home from the hospital, he denies having further concerns related to his dm at this time Reviewed next scheduled PCP follow up with Dr. Waynard Edwards on 11/25/22 @2 :15 PM, Tommy Medal 12/01/22 @2 :30 PM     Interventions Today    Flowsheet Row Most Recent Value  Chronic Disease   Chronic disease during today's visit Diabetes  General Interventions   General Interventions Discussed/Reviewed General Interventions Discussed, General Interventions Reviewed, Annual Eye Exam, Doctor Visits  Doctor Visits Discussed/Reviewed Doctor Visits Discussed, Doctor Visits Reviewed, PCP, Specialist  Exercise Interventions   Exercise Discussed/Reviewed Physical Activity, Exercise Discussed, Exercise  Reviewed  Physical Activity Discussed/Reviewed Home Exercise Program (HEP), Physical Activity Reviewed, Physical Activity Discussed  Mental Health Interventions   Mental Health Discussed/Reviewed Coping Strategies, Mental Health Reviewed, Mental Health Discussed          SDOH assessments and interventions completed:  No     Care Coordination Interventions:  Yes, provided   Follow up plan: Follow up call scheduled for 11/29/22 @1 :00 PM    Encounter Outcome:  Pt. Visit Completed

## 2022-10-11 ENCOUNTER — Ambulatory Visit: Payer: Medicare PPO | Admitting: Occupational Therapy

## 2022-10-11 ENCOUNTER — Encounter: Payer: Self-pay | Admitting: Occupational Therapy

## 2022-10-11 DIAGNOSIS — E103593 Type 1 diabetes mellitus with proliferative diabetic retinopathy without macular edema, bilateral: Secondary | ICD-10-CM

## 2022-10-11 DIAGNOSIS — R2681 Unsteadiness on feet: Secondary | ICD-10-CM | POA: Diagnosis not present

## 2022-10-11 DIAGNOSIS — R278 Other lack of coordination: Secondary | ICD-10-CM | POA: Diagnosis not present

## 2022-10-11 DIAGNOSIS — R29818 Other symptoms and signs involving the nervous system: Secondary | ICD-10-CM | POA: Diagnosis not present

## 2022-10-11 DIAGNOSIS — R208 Other disturbances of skin sensation: Secondary | ICD-10-CM | POA: Diagnosis not present

## 2022-10-11 DIAGNOSIS — R0902 Hypoxemia: Secondary | ICD-10-CM | POA: Diagnosis not present

## 2022-10-11 DIAGNOSIS — R41842 Visuospatial deficit: Secondary | ICD-10-CM

## 2022-10-11 DIAGNOSIS — I6389 Other cerebral infarction: Secondary | ICD-10-CM | POA: Diagnosis not present

## 2022-10-11 DIAGNOSIS — M6281 Muscle weakness (generalized): Secondary | ICD-10-CM

## 2022-10-11 DIAGNOSIS — R29898 Other symptoms and signs involving the musculoskeletal system: Secondary | ICD-10-CM | POA: Diagnosis not present

## 2022-10-11 NOTE — Therapy (Signed)
OUTPATIENT OCCUPATIONAL THERAPY NEUROLOGY VISIT  Patient Name: Jeff [name] MRN: [id number] DOB:1965-10-10, 57 y.o., male Today's Date: 10/11/2022  PCP:  Rodrigo Ran, MD  REFERRING PROVIDER: Cathren Harsh, MD;  Marvis Repress, MD   END OF SESSION:  OT End of Session - 10/11/22 1236     Visit Number 7    Number of Visits 11    Date for OT Re-Evaluation 11/26/22    Authorization Type [employer] - auth required, requesting 10 visits    OT Start Time 1235    OT Stop Time 1315    OT Time Calculation (min) 40 min    Activity Tolerance Patient tolerated treatment well    Behavior During Therapy [facility] for tasks assessed/performed             Past Medical History:  Diagnosis Date   Anxiety    CAD (coronary artery disease)    Cath 2011.  LAD stent patent, distal LAD occlusion, D1 100%, OM branch 80 - 90%, SVG to diag patent, LIMA to LAD occluded, SVG to RCA occluded, SVG to OM was occluded.   No change from previous cath.  Managed medically.    Cataract    Depression    Diabetes mellitus type I    on insulin pump at  home   Diabetic retinopathy    s/p vitrectomy and history of retinal surgery   Hemorrhoid    History of oxygen administration    nocutural use only at 2 l/m nasally.   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    CORONARY ARTERY BYPASS GRAFT     4 vessel 4/11   ESOPHAGOGASTRODUODENOSCOPY (EGD) WITH PROPOFOL N/A 12/05/2014   Procedure: ESOPHAGOGASTRODUODENOSCOPY (EGD) WITH PROPOFOL;  Surgeon: [name], MD;  Location: [facility];  Service: Endoscopy;  Laterality: N/A;   LAPAROSCOPIC APPENDECTOMY  11/24/2011   LEFT HEART CATHETERIZATION WITH CORONARY/GRAFT ANGIOGRAM N/A 02/22/2013   Procedure: LEFT HEART CATHETERIZATION WITH [name];  Surgeon: Tonny Bollman, MD;  Location: [facility] CATH LAB;  Service: Cardiovascular;  Laterality: N/A;   release of right transverse carpal ligament     right eye vitrectomy and detached retina repair  10/2007   TONSILLECTOMY     uvulopalatoplasty surgery     Patient Active Problem List   Diagnosis Date Noted   CVA (cerebral vascular accident) 08/23/2022   Confusion 08/23/2022   Acute arterial ischemic stroke, multifocal, mult vascular territories 08/20/2022   Encephalopathy acute 08/17/2022   Uncontrolled type 1 diabetes mellitus with hyperglycemia 08/17/2022   Acute metabolic encephalopathy 08/17/2022   DKA (diabetic ketoacidosis) 08/16/2022   AKI (acute kidney injury) 08/16/2022   Hyperkalemia 08/16/2022   Special screening for malignant neoplasms, colon    Diverticulosis  of colon without hemorrhage    Toe fracture, right    Sleep apnea    Seizure disorder    Seasonal allergic rhinitis    Myocardial infarction    Hyperlipidemia    History of oxygen administration    Hemorrhoid    Diabetes mellitus type I    Depression    Cataract    CAD (coronary artery disease)    Anxiety    Odynophagia 11/28/2014   DM type 2 causing eye disease 03/31/2014   HTN (hypertension) 03/31/2014   Partial epilepsy with impairment of consciousness 04/24/2013   Neurofibromatosis, type 1 (von Recklinghausen's disease) 04/24/2013   Pain in limb 04/24/2013   Encounter for therapeutic drug monitoring 04/24/2013   Postop check 12/08/2011   Leg pain, left 12/17/2010    HYPERLIPIDEMIA-MIXED 11/28/2009   ACUT MI SUBENDOCARDIAL INFARCT SUBSQT EPIS CARE 11/28/2009   CORONARY ATHEROSLERO AUTOL VEIN BYPASS GRAFT 11/28/2009    ONSET DATE: ~1990  REFERRING DIAG: I63.9 (ICD-10-CM) - Ischemic cerebrovascular accident (CVA) ; E11.3599 (ICD-10-CM) - Diabetic, retinopathy, proliferative   THERAPY DIAG:  Other lack of coordination  Muscle weakness (generalized)  Visuospatial deficit  Other disturbances of skin sensation  Other symptoms and signs involving the nervous system  Proliferative diabetic retinopathy of both eyes associated with type 1 diabetes mellitus, unspecified proliferative retinopathy type  Rationale for Evaluation and Treatment: Rehabilitation  SUBJECTIVE:   SUBJECTIVE STATEMENT: Pt reports he has been looking for sunglasses online. He is excited to try visual strategies from today.   Pt accompanied by: self  PERTINENT HISTORY: Patient referred by ophthalmologist after follow-up for type 1 diabetes with proliferative retinopathy of both eyes.  This visit was 08/12/2022 and prior to his hospitalization 08/16/2022  PAIN:  Are you having pain?  2/10 shoulders  FALLS: Has patient fallen in last 6 months? Yes. Number of falls 3 - Pt reports falling on 2/18 prior to hospitalization  LIVING ENVIRONMENT: Lives with: lives with their spouse Lives in: House/apartment Stairs: Yes: Internal: 12 to bedroom on second floor steps; on left going up Has following equipment at home: Walker - 2 wheeled and lift chair - RW being ordered through [employer] in [place]   PLOF: Independent; on disability, was working in [employer]  PATIENT GOALS: Learn how to cope with his visual impairments  OBJECTIVE:   GLASSES: Readers-+3.25 Sunglasses-no prescription or special lenses; reports he seldomly wears i.e. such as after dilation of his eyes  CURRENT MODIFICATIONS/ADAPTIONS:  Large text on phone  DEVICES: Hand-held pocket magnifier with light 5X  magnification Hand-held magnifier with 50 X magnification Computer PC laptop Flatscreen TV (not smart)  PHONE: Galaxy s23  HAND DOMINANCE: Ambidextrous; writes and eats left handed   ADLs: Overall ADLs: no decline since CVA   IADLs: Overall: no reported change Handwriting:  no reported change   MOBILITY STATUS: Independent   ACTIVITY TOLERANCE: Activity tolerance: fair   FUNCTIONAL OUTCOME MEASURES: FOTO: 91 (Slight impairment)      UPPER EXTREMITY ROM:     BUE - WNL   UPPER EXTREMITY MMT:      BUE - WNL   HAND FUNCTION: Grip strength: Right: 62.3 lbs; Left: 60.1 lbs   COORDINATION: 9 Hole Peg test: Right: 28 sec; Left: 32 sec   SENSATION: WFL; some chronic paresthesias pt attributes to finger pricks   EDEMA: none observed or reported   MUSCLE TONE: WFL   COGNITION: Overall cognitive status: Within functional limits for tasks assessed   VISION: Subjective report: more  impacted by glare and font type Baseline vision:  requires use of glasses for distance and near  Visual history:  proliferative diabetic retinopathy, optic atrophy OU, cataract removal, and positive pressure ventilation  Per ophthalmology note:  Distance acuity without correction on the right: 20/800  left: 20/100 +1 Visual fields impaired bilaterally and equally, lacks peripheral vision  OBSERVATIONS: Pt appears well kept. Ambulates from lobby to treatment room without use of AD, no LOB. Has glasses on eyeglass strap.    TODAY'S TREATMENT:                                                                                                                               - Therapeutic exercises completed for duration as noted below including:  OT reviewed LUE red Thera-Band HEP including shoulder flexion, horizontal abd/add and green theraband bicep curl, and tricep extension to promote strengthening of affected extremity and overall endurance as noted in pt instructions requiring min cues.    Pt completed bicep curls and tricep ext with 9 lb hand weight as well for LUE strengthening.   - Self-care/home management completed for duration as noted below including: OT added Magnifier and Magnification quick access options on pt's phone for use with items in front of him (I.e. shelf labels) or items on his screen (emoji reactions) respectively. Pt able to go into and out of modes.   PATIENT EDUCATION: Education details: LUE strengthening/visual strategies Person educated: Patient Education method: Explanation, demonstration Education comprehension: verbalized understanding, returned demonstration, and needs further education  HOME EXERCISE PROGRAM: 09/16/22: Coordination HEP  4/8: low vision recommendations 10/05/2022: LUE theraband  GOALS:  SHORT TERM GOALS: Target date: 10/08/2022     Patient will demonstrate  LUE HEP with 25% verbal cues or less for proper execution. Baseline: Goal status: IN PROGRESS   2.  Patient will independently recall at least 2 compensatory strategies for visual impairment without cueing. Baseline:  Goal status: IN PROGRESS   3.  Pt to complete low vision assessment.  Baseline:  Goal status: MET   4.  Patient will complete nine-hole peg with use of L in 30 seconds or less for at least 2 visits. Baseline: 30 Goal status: INITIAL     LONG TERM GOALS: Target date: 11/26/2022   Patient will demonstrate updated LUE HEP with 25% verbal cues or less for proper execution. Baseline:  Goal status: INITIAL   2. Patient will return demonstration of visual adaptation use and verbalize understanding of how to obtain item(s) if desired.  Baseline:  Goal status: INITIAL   3.  Patient will report no difficulty with lifting heavy objects with use of LUE Baseline: a little Goal status: INITIAL   4.  Patient will complete FOTO at discharge. Baseline:  Goal status: INITIAL  ASSESSMENT:   CLINICAL IMPRESSION: Pt to benefit from additional skilled OT  services to maximize independence and safety with ADLs and IADLs following his stroke but also due  to his chronic low vision. Good understanding of adaptive strategies as needed for visual impairments.    PERFORMANCE DEFICITS: in functional skills including ADLs, IADLs, coordination, strength, Fine motor control, vision, and UE functional use.    IMPAIRMENTS: are limiting patient from ADLs, IADLs, play, leisure, and social participation.    CO-MORBIDITIES: has co-morbidities such as diabetic retinopathy  that affects occupational performance. Patient will benefit from skilled OT to address above impairments and improve overall function.   REHAB POTENTIAL: Fair given diagnoses     PLAN:   OT FREQUENCY: 1x/week   OT DURATION: 10 weeks   PLANNED INTERVENTIONS: self care/ADL training, therapeutic exercise, therapeutic activity, neuromuscular re-education, functional mobility training, ultrasound, paraffin, fluidotherapy, moist heat, patient/family education, visual/perceptual remediation/compensation, DME and/or AE instructions, and Dry needling   RECOMMENDED OTHER SERVICES: none at this time   CONSULTED AND AGREED WITH PLAN OF CARE: Patient and family member/caregiver  PLAN FOR NEXT SESSION: review LUE coordination and magnifier  Delana Meyer, OT 10/11/2022, 12:39 PM

## 2022-10-12 DIAGNOSIS — F429 Obsessive-compulsive disorder, unspecified: Secondary | ICD-10-CM | POA: Diagnosis not present

## 2022-10-13 ENCOUNTER — Ambulatory Visit: Payer: Medicare PPO | Admitting: Physical Therapy

## 2022-10-13 ENCOUNTER — Ambulatory Visit: Payer: Medicare PPO | Admitting: Occupational Therapy

## 2022-10-13 ENCOUNTER — Encounter: Payer: Self-pay | Admitting: Occupational Therapy

## 2022-10-13 DIAGNOSIS — R278 Other lack of coordination: Secondary | ICD-10-CM | POA: Diagnosis not present

## 2022-10-13 DIAGNOSIS — R208 Other disturbances of skin sensation: Secondary | ICD-10-CM | POA: Diagnosis not present

## 2022-10-13 DIAGNOSIS — R41842 Visuospatial deficit: Secondary | ICD-10-CM | POA: Diagnosis not present

## 2022-10-13 DIAGNOSIS — R29818 Other symptoms and signs involving the nervous system: Secondary | ICD-10-CM | POA: Diagnosis not present

## 2022-10-13 DIAGNOSIS — E103593 Type 1 diabetes mellitus with proliferative diabetic retinopathy without macular edema, bilateral: Secondary | ICD-10-CM

## 2022-10-13 DIAGNOSIS — M6281 Muscle weakness (generalized): Secondary | ICD-10-CM | POA: Diagnosis not present

## 2022-10-13 DIAGNOSIS — R2681 Unsteadiness on feet: Secondary | ICD-10-CM | POA: Diagnosis not present

## 2022-10-13 DIAGNOSIS — I6389 Other cerebral infarction: Secondary | ICD-10-CM | POA: Diagnosis not present

## 2022-10-13 DIAGNOSIS — R29898 Other symptoms and signs involving the musculoskeletal system: Secondary | ICD-10-CM | POA: Diagnosis not present

## 2022-10-13 NOTE — Therapy (Signed)
OUTPATIENT OCCUPATIONAL THERAPY NEUROLOGY VISIT  Patient Name: Jeff [name] MRN: [id number] DOB:[birth year], 57 y.o., male Today's Date: 10/13/2022  PCP:  Rodrigo Ran, MD  REFERRING PROVIDER: Cathren Harsh, MD;  Marvis Repress, MD   END OF SESSION:  OT End of Session - 10/13/22 1238     Visit Number 8    Number of Visits 11    Date for OT Re-Evaluation 11/26/22    Authorization Type [employer] - auth required, requesting 10 visits    OT Start Time 1236    OT Stop Time 1315    OT Time Calculation (min) 39 min    Activity Tolerance Patient tolerated treatment well    Behavior During Therapy [facility] for tasks assessed/performed             Past Medical History:  Diagnosis Date   Anxiety    CAD (coronary artery disease)    Cath 2011.  LAD stent patent, distal LAD occlusion, D1 100%, OM branch 80 - 90%, SVG to diag patent, LIMA to LAD occluded, SVG to RCA occluded, SVG to OM was occluded.   No change from previous cath.  Managed medically.    Cataract    Depression    Diabetes mellitus type I    on insulin pump at  home   Diabetic retinopathy    s/p vitrectomy and history of retinal surgery   Hemorrhoid    History of oxygen administration    nocutural use only at 2 l/m nasally.   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    CORONARY ARTERY BYPASS GRAFT     4 vessel 4/11   ESOPHAGOGASTRODUODENOSCOPY (EGD) WITH PROPOFOL N/A 12/05/2014   Procedure: ESOPHAGOGASTRODUODENOSCOPY (EGD) WITH PROPOFOL;  Surgeon: [name], MD;  Location: [facility];  Service: Endoscopy;  Laterality: N/A;   LAPAROSCOPIC APPENDECTOMY  11/24/2011   LEFT HEART CATHETERIZATION WITH CORONARY/GRAFT ANGIOGRAM N/A 02/22/2013   Procedure: LEFT HEART CATHETERIZATION WITH [name];  Surgeon: Tonny Bollman, MD;  Location: [facility] CATH LAB;  Service: Cardiovascular;  Laterality: N/A;   release of right transverse carpal ligament     right eye vitrectomy and detached retina repair  10/2007   TONSILLECTOMY     uvulopalatoplasty surgery     Patient Active Problem List   Diagnosis Date Noted   CVA (cerebral vascular accident) 08/23/2022   Confusion 08/23/2022   Acute arterial ischemic stroke, multifocal, mult vascular territories 08/20/2022   Encephalopathy acute 08/17/2022   Uncontrolled type 1 diabetes mellitus with hyperglycemia 08/17/2022   Acute metabolic encephalopathy 08/17/2022   DKA (diabetic ketoacidosis) 08/16/2022   AKI (acute kidney injury) 08/16/2022   Hyperkalemia 08/16/2022   Special screening for malignant neoplasms, colon    Diverticulosis  of colon without hemorrhage    Toe fracture, right    Sleep apnea    Seizure disorder    Seasonal allergic rhinitis    Myocardial infarction    Hyperlipidemia    History of oxygen administration    Hemorrhoid    Diabetes mellitus type I    Depression    Cataract    CAD (coronary artery disease)    Anxiety    Odynophagia 11/28/2014   DM type 2 causing eye disease 03/31/2014   HTN (hypertension) 03/31/2014   Partial epilepsy with impairment of consciousness 04/24/2013   Neurofibromatosis, type 1 (von Recklinghausen's disease) 04/24/2013   Pain in limb 04/24/2013   Encounter for therapeutic drug monitoring 04/24/2013   Postop check 12/08/2011   Leg pain, left 12/17/2010    HYPERLIPIDEMIA-MIXED 11/28/2009   ACUT MI SUBENDOCARDIAL INFARCT SUBSQT EPIS CARE 11/28/2009   CORONARY ATHEROSLERO AUTOL VEIN BYPASS GRAFT 11/28/2009    ONSET DATE: ~1990  REFERRING DIAG: I63.9 (ICD-10-CM) - Ischemic cerebrovascular accident (CVA) ; E11.3599 (ICD-10-CM) - Diabetic, retinopathy, proliferative   THERAPY DIAG:  Visuospatial deficit  Proliferative diabetic retinopathy of both eyes associated with type 1 diabetes mellitus, unspecified proliferative retinopathy type  Rationale for Evaluation and Treatment: Rehabilitation  SUBJECTIVE:   SUBJECTIVE STATEMENT: Pt reports he got some plastic balls for his coordination HEP. He has been working with the accessibility features on his phone.   Pt accompanied by: self  PERTINENT HISTORY: Patient referred by ophthalmologist after follow-up for type 1 diabetes with proliferative retinopathy of both eyes.  This visit was 08/12/2022 and prior to his hospitalization 08/16/2022  PAIN:  Are you having pain? No  FALLS: Has patient fallen in last 6 months? Yes. Number of falls 3 - Pt reports falling on 2/18 prior to hospitalization  LIVING ENVIRONMENT: Lives with: lives with their spouse Lives in: House/apartment Stairs: Yes: Internal: 12 to bedroom on second floor steps; on left going up Has following equipment at home: Walker - 2 wheeled and lift chair - RW being ordered through [employer] in [place]   PLOF: Independent; on disability, was working in [employer]  PATIENT GOALS: Learn how to cope with his visual impairments  OBJECTIVE:   GLASSES: Readers-+3.25 Sunglasses-no prescription or special lenses; reports he seldomly wears i.e. such as after dilation of his eyes  CURRENT MODIFICATIONS/ADAPTIONS:  Large text on phone  DEVICES: Hand-held pocket magnifier with light 5X magnification Hand-held magnifier with 50 X magnification Computer PC laptop Flatscreen TV (not smart)  PHONE: Galaxy s23  HAND DOMINANCE: Ambidextrous;  writes and eats left handed   ADLs: Overall ADLs: no decline since CVA   IADLs: Overall: no reported change Handwriting:  no reported change   MOBILITY STATUS: Independent   ACTIVITY TOLERANCE: Activity tolerance: fair   FUNCTIONAL OUTCOME MEASURES: FOTO: 91 (Slight impairment)      UPPER EXTREMITY ROM:     BUE - WNL   UPPER EXTREMITY MMT:      BUE - WNL   HAND FUNCTION: Grip strength: Right: 62.3 lbs; Left: 60.1 lbs   COORDINATION: 9 Hole Peg test: Right: 28 sec; Left: 32 sec   SENSATION: WFL; some chronic paresthesias pt attributes to finger pricks   EDEMA: none observed or reported   MUSCLE TONE: WFL   COGNITION: Overall cognitive status: Within functional limits for tasks assessed   VISION: Subjective report: more impacted by glare and font type Baseline vision:  requires use of glasses for distance and near  Visual history:  proliferative  diabetic retinopathy, optic atrophy OU, cataract removal, and positive pressure ventilation  Per ophthalmology note:  Distance acuity without correction on the right: 20/800  left: 20/100 +1 Visual fields impaired bilaterally and equally, lacks peripheral vision  OBSERVATIONS: Pt appears well kept. Ambulates from lobby to treatment room without use of AD, no LOB. Has glasses on eyeglass strap.    TODAY'S TREATMENT:                                                                                                                              - Self-care/home management completed for duration as noted below including: OT reviewed Magnifier and Magnification quick access options on pt's phone for use with items. Additional instructions provided as noted in pt instructions. Pt demonstrated use with newspaper and phone emails respectively.  PATIENT EDUCATION: Education details: visual strategies Person educated: Patient Education method: Explanation, demonstration, handouts Education comprehension: verbalized understanding,  returned demonstration, and needs further education  HOME EXERCISE PROGRAM: 09/16/22: Coordination HEP  4/8: low vision recommendations 10/05/2022: LUE theraband 4/17: Android Accessibility  GOALS:  SHORT TERM GOALS: Target date: 10/08/2022     Patient will demonstrate  LUE HEP with 25% verbal cues or less for proper execution. Baseline: Goal status: IN PROGRESS   2.  Patient will independently recall at least 2 compensatory strategies for visual impairment without cueing. Baseline:  Goal status: IN PROGRESS   3.  Pt to complete low vision assessment.  Baseline:  Goal status: MET   4.  Patient will complete nine-hole peg with use of L in 30 seconds or less for at least 2 visits. Baseline: 30 Goal status: INITIAL     LONG TERM GOALS: Target date: 11/26/2022   Patient will demonstrate updated LUE HEP with 25% verbal cues or less for proper execution. Baseline:  Goal status: INITIAL   2. Patient will return demonstration of visual adaptation use and verbalize understanding of how to obtain item(s) if desired.  Baseline:  Goal status: INITIAL   3.  Patient will report no difficulty with lifting heavy objects with use of LUE Baseline: a little Goal status: INITIAL   4.  Patient will complete FOTO at discharge. Baseline:  Goal status: INITIAL  ASSESSMENT:   CLINICAL IMPRESSION: Good understanding of adaptive strategies as needed for visual impairments. Pt able to read 18 font handouts with use of reading glasses. Will focus on LUE limitations next visit.    PERFORMANCE DEFICITS: in functional skills including ADLs, IADLs, coordination, strength, Fine motor control, vision, and UE functional use.    IMPAIRMENTS: are limiting patient from ADLs, IADLs, play, leisure, and social participation.    CO-MORBIDITIES: has co-morbidities such as diabetic retinopathy  that affects occupational performance. Patient will benefit from skilled OT to address above impairments and improve  overall function.   REHAB POTENTIAL: Fair given diagnoses     PLAN:   OT FREQUENCY: 1x/week   OT DURATION: 10 weeks  PLANNED INTERVENTIONS: self care/ADL training, therapeutic exercise, therapeutic activity, neuromuscular re-education, functional mobility training, ultrasound, paraffin, fluidotherapy, moist heat, patient/family education, visual/perceptual remediation/compensation, DME and/or AE instructions, and Dry needling   RECOMMENDED OTHER SERVICES: none at this time   CONSULTED AND AGREED WITH PLAN OF CARE: Patient and family member/caregiver  PLAN FOR NEXT SESSION: review LUE coordination and strengthening  Delana Meyer, OT 10/13/2022, 1:26 PM

## 2022-10-13 NOTE — Patient Instructions (Addendum)
Phone Magnifier Instructions  Tap accessibility 'person' in bottom right corner of the phone's home screen  Tap "Magnifier" (top choice)  Toggle from (- to +) to zoom in and out  Press the middle big WHITE button to capture photo Hit the BOX with the ARROW on the right to save the photo to your gallery   On the RIGHT side - the 3 circles change the filter choices and can change colors between [employer]  Hit the circles again to get out of the filters  The button to the LEFT is your flashlight  Magnification of Phone Screen  Click Accessibility 'person' on bottom right corner of the phone's home screen  Tap "Magnification" (bottom choice) and the Magnification window will pop up  Drag BLUE magnification screen around the phone home screen to enlarge print and images  Stretch finger and thumb apart (inside the blue box) to zoom in further and bring fingers together to zoom out  Hit the blue button in the bottom right corner of Magnification window to change size of magnification window  To CLOSE the magnification window - HIT the accessibility person and select magnification

## 2022-10-14 DIAGNOSIS — I10 Essential (primary) hypertension: Secondary | ICD-10-CM | POA: Diagnosis not present

## 2022-10-14 DIAGNOSIS — Z4681 Encounter for fitting and adjustment of insulin pump: Secondary | ICD-10-CM | POA: Diagnosis not present

## 2022-10-14 DIAGNOSIS — Z794 Long term (current) use of insulin: Secondary | ICD-10-CM | POA: Diagnosis not present

## 2022-10-14 DIAGNOSIS — E1039 Type 1 diabetes mellitus with other diabetic ophthalmic complication: Secondary | ICD-10-CM | POA: Diagnosis not present

## 2022-10-14 DIAGNOSIS — I2510 Atherosclerotic heart disease of native coronary artery without angina pectoris: Secondary | ICD-10-CM | POA: Diagnosis not present

## 2022-10-14 DIAGNOSIS — H903 Sensorineural hearing loss, bilateral: Secondary | ICD-10-CM | POA: Diagnosis not present

## 2022-10-18 ENCOUNTER — Encounter: Payer: Self-pay | Admitting: Occupational Therapy

## 2022-10-18 ENCOUNTER — Ambulatory Visit: Payer: Medicare PPO | Admitting: Occupational Therapy

## 2022-10-18 DIAGNOSIS — R29898 Other symptoms and signs involving the musculoskeletal system: Secondary | ICD-10-CM | POA: Diagnosis not present

## 2022-10-18 DIAGNOSIS — R2681 Unsteadiness on feet: Secondary | ICD-10-CM | POA: Diagnosis not present

## 2022-10-18 DIAGNOSIS — M6281 Muscle weakness (generalized): Secondary | ICD-10-CM | POA: Diagnosis not present

## 2022-10-18 DIAGNOSIS — R208 Other disturbances of skin sensation: Secondary | ICD-10-CM | POA: Diagnosis not present

## 2022-10-18 DIAGNOSIS — R278 Other lack of coordination: Secondary | ICD-10-CM

## 2022-10-18 DIAGNOSIS — E103593 Type 1 diabetes mellitus with proliferative diabetic retinopathy without macular edema, bilateral: Secondary | ICD-10-CM | POA: Diagnosis not present

## 2022-10-18 DIAGNOSIS — I6389 Other cerebral infarction: Secondary | ICD-10-CM | POA: Diagnosis not present

## 2022-10-18 DIAGNOSIS — R41842 Visuospatial deficit: Secondary | ICD-10-CM | POA: Diagnosis not present

## 2022-10-18 DIAGNOSIS — R29818 Other symptoms and signs involving the nervous system: Secondary | ICD-10-CM

## 2022-10-18 NOTE — Therapy (Signed)
OUTPATIENT OCCUPATIONAL THERAPY NEUROLOGY VISIT  Patient Name: Jeff [name] MRN: [id number] DOB:1965/08/30, 57 y.o., Jeff [name] Date: 10/18/2022  PCP:  Jeff Ran, MD  REFERRING PROVIDER: Cathren Harsh, MD;  Jeff Repress, MD   END OF SESSION:  OT End of Session - 10/18/22 1448     Visit Number 9    Number of Visits 11    Date for OT Re-Evaluation 11/26/22    Authorization Type [employer] - auth required, requesting 10 visits    OT Start Time 1451    OT Stop Time 1530    OT Time Calculation (min) 39 min    Activity Tolerance Patient tolerated treatment well    Behavior During Therapy [facility] for tasks assessed/performed             Past Medical History:  Diagnosis Date   Anxiety    CAD (coronary artery disease)    Cath 2011.  LAD stent patent, distal LAD occlusion, D1 100%, OM branch 80 - 90%, SVG to diag patent, LIMA to LAD occluded, SVG to RCA occluded, SVG to OM was occluded.   No change from previous cath.  Managed medically.    Cataract    Depression    Diabetes mellitus type I    on insulin pump at  home   Diabetic retinopathy    s/p vitrectomy and history of retinal surgery   Hemorrhoid    History of oxygen administration    nocutural use only at 2 l/m nasally.   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    CORONARY ARTERY BYPASS GRAFT     4 vessel 4/11   ESOPHAGOGASTRODUODENOSCOPY (EGD) WITH PROPOFOL N/A 12/05/2014   Procedure: ESOPHAGOGASTRODUODENOSCOPY (EGD) WITH PROPOFOL;  Surgeon: Jeff Fee, MD;  Location: [facility];  Service: Endoscopy;  Laterality: N/A;   LAPAROSCOPIC APPENDECTOMY  11/24/2011   LEFT HEART CATHETERIZATION WITH CORONARY/GRAFT ANGIOGRAM N/A 02/22/2013   Procedure: LEFT HEART CATHETERIZATION WITH [name];  Surgeon: Jeff Bollman, MD;  Location: [facility] CATH LAB;  Service: Cardiovascular;  Laterality: N/A;   release of right transverse carpal ligament     right eye vitrectomy and detached retina repair  10/2007   TONSILLECTOMY     uvulopalatoplasty surgery     Patient Active Problem List   Diagnosis Date Noted   CVA (cerebral vascular accident) 08/23/2022   Confusion 08/23/2022   Acute arterial ischemic stroke, multifocal, mult vascular territories 08/20/2022   Encephalopathy acute 08/17/2022   Uncontrolled type 1 diabetes mellitus with hyperglycemia 08/17/2022   Acute metabolic encephalopathy 08/17/2022   DKA (diabetic ketoacidosis) 08/16/2022   AKI (acute kidney injury) 08/16/2022   Hyperkalemia 08/16/2022   Special screening for malignant neoplasms, colon    Diverticulosis  of colon without hemorrhage    Toe fracture, right    Sleep apnea    Seizure disorder    Seasonal allergic rhinitis    Myocardial infarction    Hyperlipidemia    History of oxygen administration    Hemorrhoid    Diabetes mellitus type I    Depression    Cataract    CAD (coronary artery disease)    Anxiety    Odynophagia 11/28/2014   DM type 2 causing eye disease 03/31/2014   HTN (hypertension) 03/31/2014   Partial epilepsy with impairment of consciousness 04/24/2013   Neurofibromatosis, type 1 (von Recklinghausen's disease) 04/24/2013   Pain in limb 04/24/2013   Encounter for therapeutic drug monitoring 04/24/2013   Postop check 12/08/2011   Leg pain, left 12/17/2010    HYPERLIPIDEMIA-MIXED 11/28/2009   ACUT MI SUBENDOCARDIAL INFARCT SUBSQT EPIS CARE 11/28/2009   CORONARY ATHEROSLERO AUTOL VEIN BYPASS GRAFT 11/28/2009    ONSET DATE: ~1990  REFERRING DIAG: I63.9 (ICD-10-CM) - Ischemic cerebrovascular accident (CVA) ; E11.3599 (ICD-10-CM) - Diabetic, retinopathy, proliferative   THERAPY DIAG:  Other lack of coordination  Muscle weakness (generalized)  Other disturbances of skin sensation  Other symptoms and signs involving the nervous system  Rationale for Evaluation and Treatment: Rehabilitation  SUBJECTIVE:   SUBJECTIVE STATEMENT: Pt reports he continues to use the the accessibility features on his phone such as magnification function to look at labels in the store.   Pt accompanied by: self  PERTINENT HISTORY: Patient referred by ophthalmologist after follow-up for type 1 diabetes with proliferative retinopathy of both eyes.  This visit was 08/12/2022 and prior to his hospitalization 08/16/2022  PAIN:  Are you having pain? Yes: NPRS scale: 6/10 Pain location: right thumb Pain description: sharp pain runs up and down thumb/through MCP joint Aggravating factors: *** Relieving factors: ***  FALLS: Has patient fallen in last 6 months? Yes. Number of falls 3 - Pt reports falling on 2/18 prior to hospitalization  LIVING ENVIRONMENT: Lives with: lives with their spouse Lives in: House/apartment Stairs: Yes: Internal: 12 to bedroom on second floor steps; on left going up Has following equipment at home: Walker - 2 wheeled and lift chair - RW being ordered through [employer] in [place]   PLOF: Independent; on disability, was working in [employer]  PATIENT GOALS: Learn how to cope with his visual impairments  OBJECTIVE:   GLASSES: Readers-+3.25 Sunglasses-no prescription or special lenses; reports he seldomly wears i.e. such as after dilation of his eyes  CURRENT MODIFICATIONS/ADAPTIONS:  Large text on phone  DEVICES: Hand-held pocket magnifier  with light 5X magnification Hand-held magnifier with 50 X magnification Computer PC laptop Flatscreen TV (not smart)  PHONE: Galaxy s23  HAND DOMINANCE: Ambidextrous; writes and eats left handed   ADLs: Overall ADLs: no decline since CVA   IADLs: Overall: no reported change Handwriting:  no reported change   MOBILITY STATUS: Independent   ACTIVITY TOLERANCE: Activity tolerance: fair   FUNCTIONAL OUTCOME MEASURES: FOTO: 91 (Slight impairment)      UPPER EXTREMITY ROM:     BUE - WNL   UPPER EXTREMITY MMT:      BUE - WNL   HAND FUNCTION: Grip strength: Right: 62.3 lbs; Left: 60.1 lbs   COORDINATION: 9 Hole Peg test: Right: 28 sec; Left: 32 sec   SENSATION: WFL; some chronic paresthesias pt attributes to finger pricks   EDEMA: none observed or reported   MUSCLE TONE: WFL   COGNITION: Overall cognitive status: Within functional limits for  tasks assessed   VISION: Subjective report: more impacted by glare and font type Baseline vision:  requires use of glasses for distance and near  Visual history:  proliferative diabetic retinopathy, optic atrophy OU, cataract removal, and positive pressure ventilation  Per ophthalmology note:  Distance acuity without correction on the right: 20/800  left: 20/100 +1 Visual fields impaired bilaterally and equally, lacks peripheral vision  OBSERVATIONS: Pt appears well kept. Ambulates from lobby to treatment room without use of AD, no LOB. Has glasses on eyeglass strap.    TODAY'S TREATMENT:                                                                                                                              - Therapeutic Activities -- Card activity conducted with large print playing cards for improved visual regard with patient actually removing his eyeglasses to play at table top level.  He was engaged in playing War with resident engaged in visual acuity tasks to determine winner of each draw (by identifying the  highest card) and fine motor tasks with L UE ie) to pick up cards off the table top and place them atop the correct pile.  Resident able to perform task with min Vcs to pick up items rather than slide them across table top. Resident completed 9-Hole Peg test in 27 seconds with LUE today. Visual discrimination tasks also conducted with colored UNO cards to work on distinguishing colors and numbers with trial of glare reducing filter with some reduction noted with red and green colored cards. Reviewed visual distinguishing with colored dominos PATIENT EDUCATION: Education details: visual strategies Person educated: Patient Education method: Explanation, demonstration, handouts Education comprehension: verbalized understanding, returned demonstration, and needs further education  HOME EXERCISE PROGRAM:  4/8: low vision recommendations 10/05/2022: LUE theraband 4/17: Android Accessibility 4/22: Reviewed visual aides/websites from handouts  GOALS:  SHORT TERM GOALS: Target date: 10/08/2022     Patient will demonstrate  LUE HEP with 25% verbal cues or less for proper execution. Baseline: Goal status: IN PROGRESS   2.  Patient will independently recall at least 2 compensatory strategies for visual impairment without cueing. Baseline:  Goal status: IN PROGRESS   3.  Pt to complete low vision assessment.  Baseline:  Goal status: MET   4.  Patient will complete nine-hole peg with use of L in 30 seconds or less for at least 2 visits. Baseline: 30 Goal status: IN PROGRESS 10/18/22 -- 27 seconds     LONG TERM GOALS: Target date: 11/26/2022   Patient will demonstrate updated LUE HEP with 25% verbal cues or less for proper execution. Baseline:  Goal status: INITIAL   2. Patient will return demonstration of visual adaptation use and verbalize understanding of how to obtain item(s) if desired.  Baseline:  Goal status: INITIAL   3.  Patient will report no difficulty with lifting heavy  objects with use of LUE Baseline: a little Goal status: INITIAL  4.  Patient will complete FOTO at discharge. Baseline:  Goal status: INITIAL  ASSESSMENT:   CLINICAL IMPRESSION: Good understanding of adaptive strategies as needed for visual impairments. Pt able to read 18 font handouts with use of reading glasses. Will focus on LUE limitations next visit.    PERFORMANCE DEFICITS: in functional skills including ADLs, IADLs, coordination, strength, Fine motor control, vision, and UE functional use.    IMPAIRMENTS: are limiting patient from ADLs, IADLs, play, leisure, and social participation.    CO-MORBIDITIES: has co-morbidities such as diabetic retinopathy  that affects occupational performance. Patient will benefit from skilled OT to address above impairments and improve overall function.   REHAB POTENTIAL: Fair given diagnoses     PLAN:   OT FREQUENCY: 1x/week   OT DURATION: 10 weeks   PLANNED INTERVENTIONS: self care/ADL training, therapeutic exercise, therapeutic activity, neuromuscular re-education, functional mobility training, ultrasound, paraffin, fluidotherapy, moist heat, patient/family education, visual/perceptual remediation/compensation, DME and/or AE instructions, and Dry needling   RECOMMENDED OTHER SERVICES: none at this time   CONSULTED AND AGREED WITH PLAN OF CARE: Patient and family member/caregiver  PLAN FOR NEXT SESSION: review LUE coordination and strengthening  Delana Meyer, OT 10/18/2022, 5:31 PM

## 2022-10-25 ENCOUNTER — Ambulatory Visit: Payer: Medicare PPO | Admitting: Occupational Therapy

## 2022-10-25 DIAGNOSIS — M6281 Muscle weakness (generalized): Secondary | ICD-10-CM

## 2022-10-25 DIAGNOSIS — R208 Other disturbances of skin sensation: Secondary | ICD-10-CM | POA: Diagnosis not present

## 2022-10-25 DIAGNOSIS — I6389 Other cerebral infarction: Secondary | ICD-10-CM | POA: Diagnosis not present

## 2022-10-25 DIAGNOSIS — R29898 Other symptoms and signs involving the musculoskeletal system: Secondary | ICD-10-CM

## 2022-10-25 DIAGNOSIS — E103593 Type 1 diabetes mellitus with proliferative diabetic retinopathy without macular edema, bilateral: Secondary | ICD-10-CM | POA: Diagnosis not present

## 2022-10-25 DIAGNOSIS — R41842 Visuospatial deficit: Secondary | ICD-10-CM

## 2022-10-25 DIAGNOSIS — R278 Other lack of coordination: Secondary | ICD-10-CM | POA: Diagnosis not present

## 2022-10-25 DIAGNOSIS — R2681 Unsteadiness on feet: Secondary | ICD-10-CM | POA: Diagnosis not present

## 2022-10-25 DIAGNOSIS — R29818 Other symptoms and signs involving the nervous system: Secondary | ICD-10-CM

## 2022-10-25 NOTE — Therapy (Signed)
OUTPATIENT OCCUPATIONAL THERAPY NEUROLOGY PROGRESS NOTE and DISCHARGE  Patient Name: Jeff [name] MRN: [id number] DOB:02/23/1966, 57 y.o., male Today's Date: 10/25/2022  OCCUPATIONAL THERAPY DISCHARGE SUMMARY  Visits from Start of Care: 10  Current functional level related to goals / functional outcomes: Patient has met 4/4 short-term goals and 4/4 long-term goals to date.   Remaining deficits: Pt remains limited by low vision (limited peripheral vision)   Education / Equipment: Continue with LUE HEP and vision recommendations following OT d/c   Patient agrees to discharge. Patient goals were met. Patient is being discharged due to meeting the stated rehab goals.[name]    PCP:  Rodrigo Ran, MD  REFERRING PROVIDER: Cathren Harsh, MD;  Marvis Repress, MD   END OF SESSION:  OT End of Session - 10/25/22 1455     Visit Number 10    Number of Visits 10    Date for OT Re-Evaluation 11/26/22    Authorization Type [employer] - auth approved    Authorization Time Period 09/09/2022 - 11/26/2022    Authorization - Visit Number 10    Authorization - Number of Visits 10    Progress Note Due on Visit 10    OT Start Time 1455    OT Stop Time 1548    OT Time Calculation (min) 53 min    Activity Tolerance Patient tolerated treatment well    Behavior During Therapy [facility] for tasks assessed/performed             Past Medical History:  Diagnosis Date   Anxiety    CAD (coronary artery disease)    Cath 2011.  LAD stent patent, distal LAD occlusion, D1 100%, OM branch 80 - 90%, SVG to diag patent, LIMA to LAD occluded, SVG to RCA occluded, SVG to OM was occluded.   No change from previous cath.  Managed medically.    Cataract    Depression    Diabetes mellitus type I (HCC)    on insulin pump at  home   Diabetic retinopathy    s/p vitrectomy and history of retinal surgery   Hemorrhoid    History of oxygen administration    nocutural use only at 2 l/m nasally.   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   CORONARY ARTERY BYPASS GRAFT     4 vessel 4/11   ESOPHAGOGASTRODUODENOSCOPY (EGD) WITH PROPOFOL N/A 12/05/2014   Procedure: ESOPHAGOGASTRODUODENOSCOPY (EGD) WITH PROPOFOL;  Surgeon: [name], MD;  Location: [facility];  Service: Endoscopy;  Laterality: N/A;   LAPAROSCOPIC APPENDECTOMY  11/24/2011   LEFT HEART CATHETERIZATION WITH CORONARY/GRAFT ANGIOGRAM N/A 02/22/2013   Procedure: LEFT HEART CATHETERIZATION WITH [name];  Surgeon: Tonny Bollman, MD;  Location:  [facility] CATH LAB;  Service: Cardiovascular;  Laterality: N/A;   release of right transverse carpal ligament     right eye vitrectomy and detached retina repair  10/2007   TONSILLECTOMY     uvulopalatoplasty surgery     Patient Active Problem List   Diagnosis Date Noted   CVA (cerebral vascular accident) (HCC) 08/23/2022   Confusion 08/23/2022   Acute arterial ischemic stroke, multifocal, mult vascular territories (HCC) 08/20/2022   Encephalopathy acute 08/17/2022   Uncontrolled type 1 diabetes mellitus with hyperglycemia (HCC) 08/17/2022   Acute metabolic encephalopathy 08/17/2022   DKA (diabetic ketoacidosis) (HCC) 08/16/2022   AKI (acute kidney injury) (HCC) 08/16/2022   Hyperkalemia 08/16/2022   Special screening  for malignant neoplasms, colon    Diverticulosis of colon without hemorrhage    Toe fracture, right    Sleep apnea    Seizure disorder (HCC)    Seasonal allergic rhinitis    Myocardial infarction (HCC)    Hyperlipidemia    History of oxygen administration    Hemorrhoid    Diabetes mellitus type I (HCC)    Depression    Cataract    CAD (coronary artery disease)    Anxiety    Odynophagia 11/28/2014   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HCC) 03/31/2014   HTN (hypertension) 03/31/2014   Partial epilepsy with impairment of consciousness (HCC) 04/24/2013   Neurofibromatosis, 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HCC) 04/24/2013   Pain in limb 04/24/2013   Encounter for therapeutic drug monitoring 04/24/2013   Postop check 12/08/2011   Leg pain, left 12/17/2010   HYPERLIPIDEMIA-MIXED 11/28/2009   ACUT MI SUBENDOCARDIAL INFARCT SUBSQT EPIS CARE 11/28/2009   CORONARY ATHEROSLERO AUTOL VEIN BYPASS GRAFT 11/28/2009    ONSET DATE: ~1990  REFERRING DIAG: I63.9 (ICD-10-CM) - Ischemic cerebrovascular accident (CVA) ; E11.3599 (ICD-10-CM) - Diabetic, retinopathy, proliferative   THERAPY DIAG:  Other lack of coordination  Muscle weakness (generalized)  Other disturbances of skin sensation  Other symptoms and signs involving the nervous system  Other symptoms and signs involving the musculoskeletal system  Visuospatial deficit  Proliferative diabetic retinopathy of both eyes associated with type 1 diabetes mellitus, unspecified proliferative retinopathy type (HCC)  Acute arterial ischemic stroke, multifocal, mult vascular territories [facility])  Rationale for Evaluation and Treatment: Rehabilitation  SUBJECTIVE:   SUBJECTIVE STATEMENT: Was pulling weeds on Saturday and on Sunday, he had a twinge in his L hand.   Pt accompanied by: self  PERTINENT HISTORY: Patient referred by ophthalmologist after follow-up for type 1 diabetes with proliferative retinopathy of both eyes.  This visit  was 08/12/2022 and prior to his hospitalization 08/16/2022  PAIN:  Are you having pain? No  FALLS: Has patient fallen in last 6 months? Yes. Number of falls 3 - Pt reports falling on 2/18 prior to hospitalization  LIVING ENVIRONMENT: Lives with: lives with their spouse Lives in: House/apartment Stairs: Yes: Internal: 12 to bedroom on second floor steps; on left going up Has following equipment at home: Walker - 2 wheeled and lift chair - RW being ordered through [employer] in [place]   PLOF: Independent; on disability, was working in [employer]  PATIENT GOALS: Learn how to cope with his visual impairments  OBJECTIVE:   GLASSES: Readers-+3.25 Sunglasses-no prescription or special lenses; reports he seldomly wears i.e. such as after dilation of his eyes  CURRENT MODIFICATIONS/ADAPTIONS:  Large text on phone  DEVICES: Hand-held pocket magnifier with light 5X magnification Hand-held magnifier with 50 X magnification Computer PC laptop Flatscreen TV (not  smart)  PHONE: Galaxy s23  HAND DOMINANCE: Ambidextrous; writes and eats left handed   ADLs: Overall ADLs: no decline since CVA   IADLs: Overall: no reported change Handwriting:  no reported change   MOBILITY STATUS: Independent   ACTIVITY TOLERANCE: Activity tolerance: fair   FUNCTIONAL OUTCOME MEASURES: FOTO: 91 (Slight impairment)      UPPER EXTREMITY ROM:     BUE - WNL   UPPER EXTREMITY MMT:      BUE - WNL   HAND FUNCTION: Grip strength: Right: 62.3 lbs; Left: 60.1 lbs   COORDINATION: 9 Hole Peg test: Right: 28 sec; Left: 32 sec   SENSATION: WFL; some chronic paresthesias pt attributes to finger pricks   EDEMA: none observed or reported   MUSCLE TONE: WFL   COGNITION: Overall cognitive status: Within functional limits for tasks assessed   VISION: Subjective report: more impacted by glare and font type Baseline vision:  requires use of glasses for distance and near  Visual history:  proliferative diabetic  retinopathy, optic atrophy OU, cataract removal, and positive pressure ventilation  Per ophthalmology note:  Distance acuity without correction on the right: 20/800  left: 20/100 +1 Visual fields impaired bilaterally and equally, lacks peripheral vision  OBSERVATIONS: Pt appears well kept. Ambulates from lobby to treatment room without use of AD, no LOB. Has glasses on eyeglass strap.    TODAY'S TREATMENT:                                                                                                                              - Self-care/home management completed for duration as noted below including: Therapist reviewed goals with patient and updated patient progression.  No additional functional limitations identified. Objective measures assessed as noted in Goals section to determine progression towards goals. OT reviewed LUE HEP and vision HEP as noted in pt instructions. Pt required min cueing for proper execution.  Reviewed use of magnification with game on phone and magnifier with reverse camera mode for grooming as needed.  Pt completed simulated carry of galloon of milk around therapy gym as well as placement on counter, in cabinet, and in fridge with LUE and utilizing visual scanning techniques for clinic navigation.  PATIENT EDUCATION: Education details: OT d/c; Goal Progression Person educated: Patient Education method: Explanation, demonstration, handouts Education comprehension: verbalized understanding, returned demonstration, and needs further education  HOME EXERCISE PROGRAM:  4/8: low vision recommendations 10/05/2022: LUE theraband 4/17: Android Accessibility 4/22: Reviewed visual aides/websites from handouts  GOALS:  SHORT TERM GOALS: Target date: 10/08/2022     Patient will demonstrate LUE HEP with 25% verbal cues or less for proper execution. Baseline: Goal status: MET   2.  Patient will independently recall at least 2 compensatory strategies for visual  impairment without cueing. Baseline:  Goal status: MET   3.  Pt to complete low vision assessment.  Baseline:  Goal status: MET   4.  Patient will complete nine-hole  peg with use of L in 30 seconds or less for at least 2 visits. Baseline: 30 Goal status: MET 10/18/22 -- 27 seconds 10/25/2022 - 27 seconds     LONG TERM GOALS: Target date: 11/26/2022   Patient will demonstrate updated LUE HEP with 25% verbal cues or less for proper execution. Baseline:  Goal status: MET   2. Patient will return demonstration of visual adaptation use and verbalize understanding of how to obtain item(s) if desired.  Baseline:  Goal status: MET   3.  Patient will report no difficulty with lifting heavy objects with use of LUE Baseline: a little Goal status: MET   4.  Patient will complete FOTO at discharge. Baseline:  Goal status: MET  ASSESSMENT:   CLINICAL IMPRESSION: Patient is appropriate for discharge and no longer demonstrates medical necessity for continued skilled occupational services. Continue with therapy recommendations and follow-up with ophthalmologist.      PLAN:   OT D/C Completed  Delana Meyer, OT 10/25/2022, 4:57 PM

## 2022-10-30 ENCOUNTER — Other Ambulatory Visit: Payer: Self-pay | Admitting: Cardiovascular Disease

## 2022-10-30 DIAGNOSIS — I25719 Atherosclerosis of autologous vein coronary artery bypass graft(s) with unspecified angina pectoris: Secondary | ICD-10-CM

## 2022-10-30 DIAGNOSIS — E785 Hyperlipidemia, unspecified: Secondary | ICD-10-CM

## 2022-10-30 DIAGNOSIS — I10 Essential (primary) hypertension: Secondary | ICD-10-CM

## 2022-11-10 DIAGNOSIS — R0902 Hypoxemia: Secondary | ICD-10-CM | POA: Diagnosis not present

## 2022-11-10 DIAGNOSIS — F429 Obsessive-compulsive disorder, unspecified: Secondary | ICD-10-CM | POA: Diagnosis not present

## 2022-11-25 DIAGNOSIS — G4733 Obstructive sleep apnea (adult) (pediatric): Secondary | ICD-10-CM | POA: Diagnosis not present

## 2022-11-25 DIAGNOSIS — E785 Hyperlipidemia, unspecified: Secondary | ICD-10-CM | POA: Diagnosis not present

## 2022-11-25 DIAGNOSIS — I2510 Atherosclerotic heart disease of native coronary artery without angina pectoris: Secondary | ICD-10-CM | POA: Diagnosis not present

## 2022-11-25 DIAGNOSIS — I10 Essential (primary) hypertension: Secondary | ICD-10-CM | POA: Diagnosis not present

## 2022-11-25 DIAGNOSIS — M8580 Other specified disorders of bone density and structure, unspecified site: Secondary | ICD-10-CM | POA: Diagnosis not present

## 2022-11-25 DIAGNOSIS — Z8673 Personal history of transient ischemic attack (TIA), and cerebral infarction without residual deficits: Secondary | ICD-10-CM | POA: Diagnosis not present

## 2022-11-25 DIAGNOSIS — E1039 Type 1 diabetes mellitus with other diabetic ophthalmic complication: Secondary | ICD-10-CM | POA: Diagnosis not present

## 2022-11-25 DIAGNOSIS — F339 Major depressive disorder, recurrent, unspecified: Secondary | ICD-10-CM | POA: Diagnosis not present

## 2022-11-29 ENCOUNTER — Ambulatory Visit: Payer: Self-pay

## 2022-11-29 NOTE — Patient Instructions (Signed)
Visit Information  Thank you for taking time to visit with me today. Please don't hesitate to contact me if I can be of assistance to you.   Following are the goals we discussed today:   Goals Addressed             This Visit's Progress    To get my hearing aids       Care Coordination Interventions: Evaluation of current treatment plan related to hearing deficit  and patient's adherence to plan as established by provider Reviewed and discussed with patient his recent follow up with Audiology and Otolaryngology Determined patient verbalizes understanding of his prescribed treatment plan and is working on getting his hearing aids         To improve stamina and balance       Care Coordination Interventions: Evaluation of current treatment plan related to impaired physical mobility and patient's adherence to plan as established by provider Determined patient has been discharged from PT and OT, he continues to adhere to his HEP without difficulty  Educated patient about the PREP program, mailed printed PREP brochure for patient to review and he may consider for the future        To reduce risk of hyperglycemia       Care Coordination Interventions: Discussed and reviewed with patient recent follow up with his CDE  Determined patient continues to follow recommendations on following parameters to help guide him on changing out his insulin pump cannula, patient reports having several episodes of cannula change Reviewed with patient, upcoming scheduled appointment with CDE, Ronal Fear set for 12/01/22 @2 :30 PM Determined patient is questioning if he needs to switch his current manufacturer for his insulin pump and supplies, currently he is using [employer], has used Nurse, mental health and [facility] Service in the past Counseled patient on the option to contact [employer] to speak with a CM regarding manufacturer options        Our next appointment is by telephone on 01/28/23 at  1:00 PM  Please call the care guide team at [phone] if you need to cancel or reschedule your appointment.   If you are experiencing a Mental Health or Behavioral Health Crisis or need someone to talk to, please call 1-800-273-TALK (toll free, 24 hour hotline)  Patient verbalizes understanding of instructions and care plan provided today and agrees to view in MyChart. Active MyChart status and patient understanding of how to access instructions and care plan via MyChart confirmed with patient.     Delsa Sale, RN, BSN, CCM Care Management Coordinator [facility] Care Management Direct Phone: [phone]

## 2022-11-29 NOTE — Patient Outreach (Signed)
  Care Coordination   Follow Up Visit Note   11/29/2022 Name: ROMANUS OSADA MRN: [id number] DOB: [birth year]  [name] is a 57 y.o. year old male who sees Rodrigo Ran, MD for primary care. I spoke with  [name] by phone today.  What matters to the patients health and wellness today?  Patient would like to discuss concerns related to his insulin pump with his CDE during upcoming visit. He would like to get hearing aids. Patient may consider participating in the PREP program.     Goals Addressed             This Visit's Progress    To get my hearing aids       Care Coordination Interventions: Evaluation of current treatment plan related to hearing deficit  and patient's adherence to plan as established by provider Reviewed and discussed with patient his recent follow up with Audiology and Otolaryngology Determined patient verbalizes understanding of his prescribed treatment plan and is working on getting his hearing aids         To improve stamina and balance       Care Coordination Interventions: Evaluation of current treatment plan related to impaired physical mobility and patient's adherence to plan as established by provider Determined patient has been discharged from PT and OT, he continues to adhere to his HEP without difficulty  Educated patient about the PREP program, mailed printed PREP brochure for patient to review and he may consider for the future        To reduce risk of hyperglycemia       Care Coordination Interventions: Discussed and reviewed with patient recent follow up with his CDE  Determined patient continues to follow recommendations on following parameters to help guide him on changing out his insulin pump cannula, patient reports having several episodes of cannula change Reviewed with patient, upcoming scheduled appointment with CDE, Ronal Fear set for 12/01/22 @2 :30 PM Determined patient is questioning if he needs to switch his current  manufacturer for his insulin pump and supplies, currently he is using [employer], has used Nurse, mental health and [facility] Service in the past Counseled patient on the option to contact [employer] to speak with a CM regarding manufacturer options    Interventions Today    Flowsheet Row Most Recent Value  Chronic Disease   Chronic disease during today's visit Diabetes, Other  [impaired physical mobility,  hearing loss/aids]  General Interventions   General Interventions Discussed/Reviewed General Interventions Discussed, General Interventions Reviewed, Doctor Visits, Durable Medical Equipment (DME)  Orvis Brill aids]  Doctor Visits Discussed/Reviewed PCP, Doctor Visits Reviewed, Doctor Visits Discussed  Exercise Interventions   Exercise Discussed/Reviewed Physical Activity, Exercise Reviewed, Exercise Discussed  Physical Activity Discussed/Reviewed Physical Activity Reviewed, Physical Activity Discussed, Home Exercise Program (HEP)  Education Interventions   Education Provided Provided Education  Provided Verbal Education On Blood Sugar Monitoring, When to see the doctor  Pharmacy Interventions   Pharmacy Dicussed/Reviewed Pharmacy Topics Discussed, Pharmacy Topics Reviewed, Medications and their functions          SDOH assessments and interventions completed:  No     Care Coordination Interventions:  Yes, provided   Follow up plan: Follow up call scheduled for 01/28/23 @1 :00 PM     Encounter Outcome:  Pt. Visit Completed

## 2022-12-01 DIAGNOSIS — I10 Essential (primary) hypertension: Secondary | ICD-10-CM | POA: Diagnosis not present

## 2022-12-01 DIAGNOSIS — Z794 Long term (current) use of insulin: Secondary | ICD-10-CM | POA: Diagnosis not present

## 2022-12-01 DIAGNOSIS — E1040 Type 1 diabetes mellitus with diabetic neuropathy, unspecified: Secondary | ICD-10-CM | POA: Diagnosis not present

## 2022-12-01 DIAGNOSIS — E1039 Type 1 diabetes mellitus with other diabetic ophthalmic complication: Secondary | ICD-10-CM | POA: Diagnosis not present

## 2022-12-01 DIAGNOSIS — I2510 Atherosclerotic heart disease of native coronary artery without angina pectoris: Secondary | ICD-10-CM | POA: Diagnosis not present

## 2022-12-01 DIAGNOSIS — Z4681 Encounter for fitting and adjustment of insulin pump: Secondary | ICD-10-CM | POA: Diagnosis not present

## 2022-12-03 ENCOUNTER — Other Ambulatory Visit: Payer: Self-pay | Admitting: Neurology

## 2022-12-08 DIAGNOSIS — F429 Obsessive-compulsive disorder, unspecified: Secondary | ICD-10-CM | POA: Diagnosis not present

## 2022-12-11 DIAGNOSIS — R0902 Hypoxemia: Secondary | ICD-10-CM | POA: Diagnosis not present

## 2023-01-04 DIAGNOSIS — H903 Sensorineural hearing loss, bilateral: Secondary | ICD-10-CM | POA: Diagnosis not present

## 2023-01-05 DIAGNOSIS — E1039 Type 1 diabetes mellitus with other diabetic ophthalmic complication: Secondary | ICD-10-CM | POA: Diagnosis not present

## 2023-01-06 DIAGNOSIS — H9193 Unspecified hearing loss, bilateral: Secondary | ICD-10-CM | POA: Diagnosis not present

## 2023-01-06 DIAGNOSIS — H6123 Impacted cerumen, bilateral: Secondary | ICD-10-CM | POA: Diagnosis not present

## 2023-01-10 DIAGNOSIS — R0902 Hypoxemia: Secondary | ICD-10-CM | POA: Diagnosis not present

## 2023-01-21 ENCOUNTER — Telehealth: Payer: Self-pay | Admitting: *Deleted

## 2023-01-21 NOTE — Progress Notes (Signed)
  Care Coordination Note  01/21/2023 Name: [name] MRN: [id number] DOB: [birth year]  Jeff [name] is a 57 y.o. year old male who is a primary care patient of Perini, Loraine Leriche, MD and is actively engaged with the care management team. I reached out to Jeff [name] by phone today to assist with re-scheduling a follow up visit with the RN Case Manager  Follow up plan: Unsuccessful telephone outreach attempt made. A HIPAA compliant phone message was left for the patient providing contact information and requesting a return call.   [facility]  Care Coordination Care Guide  Direct Dial: [phone]

## 2023-01-24 DIAGNOSIS — H903 Sensorineural hearing loss, bilateral: Secondary | ICD-10-CM | POA: Diagnosis not present

## 2023-01-31 NOTE — Progress Notes (Signed)
  Care Coordination Note  01/31/2023 Name: Jeff [name] MRN: [id number] DOB: 08-Mar-1966  Jeff [name] is a 58 y.o. year old male who is a primary care patient of Perini, Loraine Leriche, MD and is actively engaged with the care management team. I reached out to Jeff [name] by phone today to assist with re-scheduling a follow up visit with the RN Case Manager  Follow up plan: Telephone appointment with care management team member scheduled for:8/27  [facility] Coordination Care Guide  Direct Dial: [phone]

## 2023-01-31 NOTE — Progress Notes (Signed)
  Care Coordination Note  01/31/2023 Name: Jeff [name] MRN: [id number] DOB: 03-Sep-1965  Jeff [name] is a 57 y.o. year old male who is a primary care patient of Perini, Loraine Leriche, MD and is actively engaged with the care management team. I reached out to Jeff [name] by phone today to assist with re-scheduling a follow up visit with the RN Case Manager  Follow up plan: Unsuccessful telephone outreach attempt made. A HIPAA compliant phone message was left for the patient providing contact information and requesting a return call.   [facility] [place]  Care Coordination Care Guide  Direct Dial: [phone]

## 2023-02-02 DIAGNOSIS — F429 Obsessive-compulsive disorder, unspecified: Secondary | ICD-10-CM | POA: Diagnosis not present

## 2023-02-10 DIAGNOSIS — R0902 Hypoxemia: Secondary | ICD-10-CM | POA: Diagnosis not present

## 2023-02-22 ENCOUNTER — Ambulatory Visit: Payer: Self-pay

## 2023-02-23 NOTE — Patient Outreach (Signed)
  Care Coordination   Documentation  Note   02/23/2023 Name: SOUMIL DOWTY MRN: [id number] DOB: 09-Nov-1965  [name] is a 57 y.o. year old male who sees Rodrigo Ran, MD for primary care. I  mailed the patient resource information as requested by [facility].  What matters to the patients health and wellness today?  SW did not speak with the patient today.   SDOH assessments and interventions completed:  No     Care Coordination Interventions:  Yes, provided   Interventions Today    Flowsheet Row Most Recent Value  Chronic Disease   Chronic disease during today's visit Other  [Hearing Impaired]  General Interventions   General Interventions Discussed/Reviewed [employer], Communication with  [employer] resource information to patients home for review]  Communication with RN  [employer] with Chartered certified accountant Little who requested SW provide patient with resource information by mail]        Follow up plan: No further intervention required.   Encounter Outcome:  Pt. Visit Completed   Bevelyn Ngo, BSW, CDP Social Worker, Certified Dementia Practitioner [facility] Care Management  Care Coordination [phone]

## 2023-02-23 NOTE — Patient Outreach (Signed)
Care Coordination   Follow Up Visit Note   02/22/2023 Name: Jeff [name] MRN: [id number] DOB: February 26, 1966  Jeff [name] is a 57 y.o. year old male who sees Rodrigo Ran, MD for primary care. I spoke with  Jeff [name] by phone today.  What matters to the patients health and wellness today?  Patient would like to keep his diabetes under good control without complications with use of his insulin pump.     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 Patient reports he is better.   Saw PCP 08/25/22.  Will see Cathy Miller-CDE in Dr. Laurey Morale office next week for insulin pump adjustment and start Dexcom.  Diabetes management good with sugars 98, 123 but this am was 205.  Discussed diabetes management. Patient also works with Reynold Bowen for patient assistance.   Patient doing neuro rehab. Patient to call [employer] about cost of walker ordered during hospitalization.  He did not want the hospital bed ordered during hospitalization. Wife providing transportation to appointments as she has taken a sabbatical from work.    Interventions Today    Flowsheet Row Most Recent Value  Chronic Disease   Chronic disease during today's visit Diabetes  General Interventions   General Interventions Discussed/Reviewed General Interventions Discussed, Doctor Visits  Doctor Visits Discussed/Reviewed Doctor Visits Discussed  Education Interventions   Education Provided Provided Education  Provided Verbal Education On Blood Sugar Monitoring, Nutrition, When to see the doctor  Mental Health Interventions   Mental Health Discussed/Reviewed Mental Health Discussed, Depression  Nutrition Interventions   Nutrition Discussed/Reviewed Nutrition Discussed, Decreasing sugar intake           COMPLETED: To get my  hearing aids       Care Coordination Interventions: Evaluation of current treatment plan related to hearing deficit  and patient's adherence to plan as established by provider Determined patient received his hearing aids, however he plans to return to the manufacturer due to having multifactor issues  Discussed patient may consider hearing aids in the future, he may use a different provider Collaboration with Bevelyn Ngo BSW regarding resources to help cover cost of hearing aids, SW will mail printed resources to patient's home address     To reduce risk of hyperglycemia   On track    Care Coordination Interventions: Provided education to patient about basic DM disease process Reviewed medications with patient and discussed importance of medication adherence Provided patient with written educational materials related to hypo and hyperglycemia and importance of correct treatment Review of patient status, including review of consultants reports, relevant laboratory and other test results, and medications completed Determined patient plans to go over a list of questions and concerns in detail with Ronal Fear CDE during upcoming scheduled visit scheduled for 03/03/23 @2 :30 PM Lab Results  Component Value Date   HGBA1C 7.4 (H) 08/16/2022      Interventions Today    Flowsheet Row Most Recent Value  Chronic Disease   Chronic disease during today's visit Diabetes, Other  [hearing impaired]  General Interventions   General Interventions Discussed/Reviewed General Interventions Discussed, General Interventions Reviewed, Doctor Visits  Doctor Visits Discussed/Reviewed Doctor Visits Discussed, Doctor Visits Reviewed, Specialist, PCP  Education Interventions   Education Provided Provided Education  Provided Verbal Education On When to see the doctor, Medication, Blood Sugar Monitoring  Pharmacy Interventions   Pharmacy Dicussed/Reviewed Pharmacy Topics Discussed, Pharmacy Topics Reviewed,  Medications and their functions          SDOH assessments and interventions completed:  No     Care Coordination Interventions:  Yes, provided   Follow up plan: Follow up call scheduled for 06/10/23 @1 :00 PM     Encounter Outcome:  Pt. Visit Completed

## 2023-02-23 NOTE — Patient Instructions (Signed)
Visit Information  Thank you for taking time to visit with me today. Please don't hesitate to contact me if I can be of assistance to you.   Following are the goals we discussed today:   Goals Addressed             This Visit's Progress    COMPLETED: Getting back to prior level of functioning-Diabetes Management       Patient with recent hospitalization for DKA due to pump failure( patient reports Kink in cannula. Patient reports he is better.   Saw PCP 08/25/22.  Will see Cathy Miller-CDE in Dr. Laurey Morale office next week for insulin pump adjustment and start Dexcom.  Diabetes management good with sugars 98, 123 but this am was 205.  Discussed diabetes management. Patient also works with Reynold Bowen for patient assistance.   Patient doing neuro rehab. Patient to call [employer] about cost of walker ordered during hospitalization.  He did not want the hospital bed ordered during hospitalization. Wife providing transportation to appointments as she has taken a sabbatical from work.    Interventions Today    Flowsheet Row Most Recent Value  Chronic Disease   Chronic disease during today's visit Diabetes  General Interventions   General Interventions Discussed/Reviewed General Interventions Discussed, Doctor Visits  Doctor Visits Discussed/Reviewed Doctor Visits Discussed  Education Interventions   Education Provided Provided Education  Provided Verbal Education On Blood Sugar Monitoring, Nutrition, When to see the doctor  Mental Health Interventions   Mental Health Discussed/Reviewed Mental Health Discussed, Depression  Nutrition Interventions   Nutrition Discussed/Reviewed Nutrition Discussed, Decreasing sugar intake         COMPLETED: To get my hearing aids       Care Coordination Interventions: Evaluation of current treatment plan related to hearing deficit  and patient's adherence to plan as established by provider Determined patient received his hearing aids, however he  plans to return to the manufacturer due to having multifactor issues  Discussed patient may consider hearing aids in the future, he may use a different provider Collaboration with Bevelyn Ngo BSW regarding resources to help cover cost of hearing aids, SW will mail printed resources to patient's home address     To reduce risk of hyperglycemia   On track    Care Coordination Interventions: Provided education to patient about basic DM disease process Reviewed medications with patient and discussed importance of medication adherence Provided patient with written educational materials related to hypo and hyperglycemia and importance of correct treatment Review of patient status, including review of consultants reports, relevant laboratory and other test results, and medications completed Determined patient plans to go over a list of questions and concerns in detail with Ronal Fear CDE during upcoming scheduled visit scheduled for 03/03/23 @2 :30 PM Lab Results  Component Value Date   HGBA1C 7.4 (H) 08/16/2022         Our next appointment is by telephone on 06/10/23 at 1:00 PM   Please call the care guide team at [phone] if you need to cancel or reschedule your appointment.   If you are experiencing a Mental Health or Behavioral Health Crisis or need someone to talk to, please call 1-800-273-TALK (toll free, 24 hour hotline)  Patient verbalizes understanding of instructions and care plan provided today and agrees to view in MyChart. Active MyChart status and patient understanding of how to access instructions and care plan via MyChart confirmed with patient.     Delsa Sale, RN, BSN, CCM Care Management Coordinator  [facility] Care Management  Direct Phone: [phone]

## 2023-03-01 DIAGNOSIS — F429 Obsessive-compulsive disorder, unspecified: Secondary | ICD-10-CM | POA: Diagnosis not present

## 2023-03-03 DIAGNOSIS — I10 Essential (primary) hypertension: Secondary | ICD-10-CM | POA: Diagnosis not present

## 2023-03-03 DIAGNOSIS — I2510 Atherosclerotic heart disease of native coronary artery without angina pectoris: Secondary | ICD-10-CM | POA: Diagnosis not present

## 2023-03-03 DIAGNOSIS — E1039 Type 1 diabetes mellitus with other diabetic ophthalmic complication: Secondary | ICD-10-CM | POA: Diagnosis not present

## 2023-03-03 DIAGNOSIS — Z4681 Encounter for fitting and adjustment of insulin pump: Secondary | ICD-10-CM | POA: Diagnosis not present

## 2023-03-03 DIAGNOSIS — Z23 Encounter for immunization: Secondary | ICD-10-CM | POA: Diagnosis not present

## 2023-03-03 DIAGNOSIS — Z794 Long term (current) use of insulin: Secondary | ICD-10-CM | POA: Diagnosis not present

## 2023-03-13 DIAGNOSIS — R0902 Hypoxemia: Secondary | ICD-10-CM | POA: Diagnosis not present

## 2023-03-21 ENCOUNTER — Encounter: Payer: Self-pay | Admitting: Cardiovascular Disease

## 2023-03-21 ENCOUNTER — Ambulatory Visit: Payer: Medicare PPO | Attending: Cardiovascular Disease | Admitting: Cardiovascular Disease

## 2023-03-21 VITALS — BP 112/60 | HR 86 | Ht 71.0 in | Wt 235.0 lb

## 2023-03-21 DIAGNOSIS — I25119 Atherosclerotic heart disease of native coronary artery with unspecified angina pectoris: Secondary | ICD-10-CM

## 2023-03-21 DIAGNOSIS — I10 Essential (primary) hypertension: Secondary | ICD-10-CM

## 2023-03-21 DIAGNOSIS — E782 Mixed hyperlipidemia: Secondary | ICD-10-CM

## 2023-03-21 MED ORDER — NITROGLYCERIN 0.4 MG SL SUBL: 0.4000 mg | SUBLINGUAL_TABLET | SUBLINGUAL | 5 refills | Status: DC | PRN

## 2023-03-21 NOTE — Patient Instructions (Signed)
Medication Instructions:  Your physician recommends that you continue on your current medications as directed. Please refer to the Current Medication list given to you today.  *If you need a refill on your cardiac medications before your next appointment, please call your pharmacy*  Lab Work: If you have labs (blood work) drawn today and your tests are completely normal, you will receive your results only by: MyChart Message (if you have MyChart) OR A paper copy in the mail If you have any lab test that is abnormal or we need to change your treatment, we will call you to review the results.  Follow-Up: At [facility], you and your health needs are our priority.  As part of our continuing mission to provide you with exceptional heart care, we have created designated Provider Care Teams.  These Care Teams include your primary Cardiologist (physician) and Advanced Practice Providers (APPs -  Physician Assistants and Nurse Practitioners) who all work together to provide you with the care you need, when you need it.  We recommend signing up for the patient portal called "MyChart".  Sign up information is provided on this After Visit Summary.  MyChart is used to connect with patients for Virtual Visits (Telemedicine).  Patients are able to view lab/test results, encounter notes, upcoming appointments, etc.  Non-urgent messages can be sent to your provider as well.   To learn more about what you can do with MyChart, go to [email or web address].    Your next appointment:   6 month(s)  Provider:   Tonny Bollman, MD

## 2023-03-21 NOTE — Progress Notes (Signed)
Cardiology Office Note:    Date:  03/21/2023   ID:  Jeff [name], DOB 07-28-1965, MRN [id number]  PCP:  Rodrigo Ran, MD   [facility] Providers Cardiologist:  Tonny Bollman, MD     Referring MD: Rodrigo Ran, MD   Chief Complaint  Patient presents with   Coronary Artery Disease    History of Present Illness:    Jeff [name] is a 57 y.o. male with a hx of coronary artery disease, presenting for follow-up evaluation.  The patient has advanced early onset multivessel CAD and he underwent CABG in 2011 complicated by early graft failure.  He later underwent multivessel stenting shortly after bypass surgery.  His last heart catheterization in 2014 demonstrated patency of his stent sites with small vessel disease pattern noted.  He has been treated medically.  The patient was last seen in March 2024 for routine follow-up after being hospitalized with DKA and elevated cardiac enzymes consistent with demand ischemia. He's here alone today. He reports some fatigue and exercise intolerance with yard work recently. He attributes this to being 'out of shape.' He denies chest pain or pressure with activity. No edema, orthopnea, or PND.     Past Medical History:  Diagnosis Date   Anxiety    CAD (coronary artery disease)    Cath 2011.  LAD stent patent, distal LAD occlusion, D1 100%, OM branch 80 - 90%, SVG to diag patent, LIMA to LAD occluded, SVG to RCA occluded, SVG to OM was occluded.   No change from previous cath.  Managed medically.    Cataract    Depression    Diabetes mellitus type I (HCC)    on insulin pump at  home   Diabetic retinopathy    s/p vitrectomy and history of retinal surgery   Hemorrhoid    History of oxygen administration    nocutural use only at 2 l/m nasally.   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   CORONARY ARTERY BYPASS GRAFT     4 vessel 4/11   ESOPHAGOGASTRODUODENOSCOPY (EGD) WITH PROPOFOL N/A 12/05/2014   Procedure: ESOPHAGOGASTRODUODENOSCOPY (EGD) WITH PROPOFOL;  Surgeon: [name], MD;  Location: [facility] ENDOSCOPY;  Service: Endoscopy;  Laterality: N/A;   LAPAROSCOPIC APPENDECTOMY  11/24/2011   LEFT HEART CATHETERIZATION WITH CORONARY/GRAFT ANGIOGRAM N/A 02/22/2013   Procedure: LEFT HEART CATHETERIZATION WITH [name];  Surgeon: Tonny Bollman, MD;  Location: [facility] CATH LAB;  Service: Cardiovascular;  Laterality: N/A;   release of right transverse carpal ligament     right eye vitrectomy and detached retina repair  10/2007   TONSILLECTOMY     uvulopalatoplasty surgery      Current Medications: Current Meds  Medication  Sig   ACCU-CHEK GUIDE test strip    Accu-Chek Softclix Lancets lancets    acetaminophen (TYLENOL) 500 MG tablet Take 1,000 mg by mouth every 6 (six) hours as needed for mild pain.   Alirocumab (PRALUENT) 150 MG/ML SOAJ Inject 150 mg into the skin every 14 (fourteen) days. Every other Tuesday   Cholecalciferol (VITAMIN D) 2000 units CAPS Take 2,000 Units by mouth daily.    clopidogrel (PLAVIX) 75 MG tablet Take 1 tablet (75 mg total) by mouth daily.   Continuous Blood Gluc Sensor (FREESTYLE LIBRE 3 SENSOR) MISC 1 Device by Does not apply route as needed (check blood glucose as needed/directed). Check blood glucose as needed/directed    cyanocobalamin 1000 MCG tablet Take 1,000 mcg by mouth daily.   desvenlafaxine (PRISTIQ) 100 MG 24 hr tablet Take 100 mg by mouth daily.   docusate sodium (COLACE) 100 MG capsule Take 100 mg by mouth at bedtime.   ezetimibe (ZETIA) 10 MG tablet Take 10 mg by mouth at bedtime.   insulin aspart (NOVOLOG) 100 UNIT/ML injection Inject 100 Units into the skin. Use per pump as needed   isosorbide mononitrate (IMDUR) 60 MG 24 hr tablet Take 60 mg by mouth every morning.    levETIRAcetam (KEPPRA) 750 MG tablet TAKE 1 TABLET(750 MG) BY MOUTH TWICE DAILY   lisinopril (PRINIVIL,ZESTRIL) 5 MG tablet Take 5 mg by mouth every morning.    metoprolol tartrate (LOPRESSOR) 25 MG tablet TAKE 1 TABLET(25 MG) BY MOUTH TWICE DAILY   Multiple Vitamins-Minerals (CENTRUM SILVER 50+MEN) TABS Take 1 tablet by mouth every morning.   OXYGEN Place 2 L into the nose at bedtime.   promethazine (PHENERGAN) 25 MG tablet Take 25 mg by mouth every 6 (six) hours as needed for nausea or vomiting.   rosuvastatin (CRESTOR) 40 MG tablet Take 40 mg by mouth at bedtime.    tamsulosin (FLOMAX) 0.4 MG CAPS capsule Take 1 capsule (0.4 mg total) by mouth daily.   TEGRETOL-XR 200 MG 12 hr tablet TAKE 3 TABLETS BY MOUTH TWO TIMES A DAY.   [DISCONTINUED] nitroGLYCERIN (NITROSTAT) 0.4 MG SL tablet Place 1 tablet (0.4 mg total) under the tongue every 5 (five) minutes as needed for chest pain.     Allergies:   Doxepin hcl, Venlafaxine, and Latex   Social History   Socioeconomic History   Marital status: Married    Spouse name: Jeff [name]   Number of children: 0   Years of education: college   Highest education level: Not on file  Occupational History   Occupation: Unemployed    Comment: previously worked in [employer]  Tobacco Use   Smoking status: Never   Smokeless tobacco: Never  Vaping Use   Vaping status: Never Used  Substance and Sexual Activity   Alcohol use: No   Drug use: No   Sexual activity: Yes  Other Topics Concern   Not on  file  Social History Narrative   Patient lives with his wifeLanora Manis).   Patient does not have any children.   Patient is on disability.   Patient is left-handed (writes), but right hand with sports.   Patient has a college education.   Patient drinks 5-6 cups of caffeine daily.   Social Determinants of Health   Financial Resource Strain: Not on file  Food Insecurity: No Food Insecurity (08/16/2022)   Hunger Vital Sign    Worried About Running Out of Food in the Last Year: Never true    Ran Out of Food  in the Last Year: Never true  Transportation Needs: No Transportation Needs (08/26/2022)   PRAPARE - Administrator, Civil Service (Medical): No    Lack of Transportation (Non-Medical): No  Physical Activity: Not on file  Stress: Not on file  Social Connections: Not on file     Family History: The patient's family history includes Arthritis in his sister; Coronary artery disease (age of onset: 25) in his father; Crohn's disease in his mother; Diabetes in his father; Hearing loss in his mother; Hypertension in his father; Squamous cell carcinoma in his brother.  ROS:   Please see the history of present illness.    All other systems reviewed and are negative.  EKGs/Labs/Other Studies Reviewed:    The following studies were reviewed today: Echo 08/20/2022: 1. Left ventricular ejection fraction, by estimation, is 60 to 65%. The  left ventricle has normal function. The left ventricle has no regional  wall motion abnormalities. There is mild concentric left ventricular  hypertrophy. Left ventricular diastolic  parameters are consistent with Grade I diastolic dysfunction (impaired  relaxation).   2. Right ventricular systolic function is normal. The right ventricular  size is not well visualized.   3. Left atrial size was mildly dilated.   4. The mitral valve is abnormal. Trivial mitral valve regurgitation.   5. The aortic valve is tricuspid. There is mild  calcification of the  aortic valve. There is mild thickening of the aortic valve. Aortic valve  regurgitation is not visualized. Aortic valve sclerosis/calcification is  present, without any evidence of  aortic stenosis.   6. Aortic dilatation noted. There is borderline dilatation of the aortic  root, measuring 37 mm.   Conclusion(s)/Recommendation(s): No intracardiac source of embolism  detected on this transthoracic study. Consider a transesophageal  echocardiogram to exclude cardiac source of embolism if clinically  indicated.       Recent Labs: 08/16/2022: ALT 35; Magnesium 2.6; TSH 3.749 08/18/2022: Hemoglobin 12.9; Platelets 160 08/22/2022: BUN 6; Creatinine, Ser 0.66; Potassium 3.6; Sodium 139  Recent Lipid Panel    Component Value Date/Time   CHOL 95 08/21/2022 0959   TRIG 157 (H) 08/21/2022 0959   HDL 39 (L) 08/21/2022 0959   CHOLHDL 2.4 08/21/2022 0959   VLDL 31 08/21/2022 0959   LDLCALC 25 08/21/2022 0959     Risk Assessment/Calculations:                Physical Exam:    VS:  BP 112/60   Pulse 86   Ht 5\' 11"  (1.803 m)   Wt 235 lb (106.6 kg)   SpO2 97%   BMI 32.78 kg/m     Wt Readings from Last 3 Encounters:  03/21/23 235 lb (106.6 kg)  10/05/22 233 lb (105.7 kg)  09/13/22 225 lb 12.8 oz (102.4 kg)     GEN:  Well nourished, well developed in no acute distress HEENT: Normal NECK: No JVD; No carotid bruits LYMPHATICS: No lymphadenopathy CARDIAC: RRR, no murmurs, rubs, gallops RESPIRATORY:  Clear to auscultation without rales, wheezing or rhonchi  ABDOMEN: Soft, non-tender, non-distended MUSCULOSKELETAL:  No edema; No deformity  SKIN: Warm and dry NEUROLOGIC:  Alert and oriented x 3 PSYCHIATRIC:  Normal affect   ASSESSMENT:    1. Coronary artery disease involving native coronary artery of native heart with angina pectoris (HCC)   2. Mixed hyperlipidemia   3. Essential hypertension   4. 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  I will see him back in 6 months for follow-up. Treated with a combination of Zetia, Praluent, and rosuvastatin.  Lipids reviewed today and he remains at goal with LDL cholesterol less than 55 mg/dL. Blood pressure well-controlled on current treatment outlined above.  We discussed the importance of getting him into a regular fitness program.  He has a friend who runs a small gym in the [place] area near his home.  He would be able to work out there and is going to look into this more seriously.  He really misses the maintenance phase of cardiac rehab but he is not able to do this anymore.         Medication Adjustments/Labs and Tests Ordered: Current medicines are reviewed at length with the patient today.  Concerns regarding medicines are outlined above.  No orders of the defined types were placed in this encounter.  Meds ordered this encounter  Medications   nitroGLYCERIN (NITROSTAT) 0.4 MG SL tablet    Sig: Place 1 tablet (0.4 mg total) under the tongue every 5 (five) minutes as needed for chest pain.    Dispense:  25 tablet    Refill:  5    Patient Instructions  Medication Instructions:  Your physician recommends that you continue on your current medications as directed. Please refer to the Current Medication list given to you today.  *If you need a refill on your cardiac medications before your next appointment, please call your pharmacy*  Lab Work: If you have labs (blood work) drawn today and your tests are completely normal, you will receive your results only by: MyChart Message (if you have MyChart) OR A paper copy in the mail If you have any  lab test that is abnormal or we need to change your treatment, we will call you to review the results.  Follow-Up: At [facility], you and your health needs are our priority.  As part of our continuing mission to provide you with exceptional heart care, we have created designated Provider Care Teams.  These Care Teams include your primary Cardiologist (physician) and Advanced Practice Providers (APPs -  Physician Assistants and Nurse Practitioners) who all work together to provide you with the care you need, when you need it.  We recommend signing up for the patient portal called "MyChart".  Sign up information is provided on this After Visit Summary.  MyChart is used to connect with patients for Virtual Visits (Telemedicine).  Patients are able to view lab/test results, encounter notes, upcoming appointments, etc.  Non-urgent messages can be sent to your provider as well.   To learn more about what you can do with MyChart, go to [email or web address].    Your next appointment:   6 month(s)  Provider:   Tonny Bollman, MD       Signed, Tonny Bollman, MD  03/21/2023 5:17 PM    [facility]

## 2023-03-29 DIAGNOSIS — F429 Obsessive-compulsive disorder, unspecified: Secondary | ICD-10-CM | POA: Diagnosis not present

## 2023-04-12 ENCOUNTER — Ambulatory Visit: Payer: Medicare PPO | Admitting: Neurology

## 2023-04-12 ENCOUNTER — Encounter: Payer: Self-pay | Admitting: Neurology

## 2023-04-12 VITALS — BP 122/68 | Ht 71.0 in | Wt 237.0 lb

## 2023-04-12 DIAGNOSIS — I63513 Cerebral infarction due to unspecified occlusion or stenosis of bilateral middle cerebral arteries: Secondary | ICD-10-CM | POA: Diagnosis not present

## 2023-04-12 DIAGNOSIS — R0902 Hypoxemia: Secondary | ICD-10-CM | POA: Diagnosis not present

## 2023-04-12 DIAGNOSIS — Q8501 Neurofibromatosis, type 1: Secondary | ICD-10-CM | POA: Diagnosis not present

## 2023-04-12 DIAGNOSIS — G40209 Localization-related (focal) (partial) symptomatic epilepsy and epileptic syndromes with complex partial seizures, not intractable, without status epilepticus: Secondary | ICD-10-CM

## 2023-04-12 MED ORDER — LEVETIRACETAM 750 MG PO TABS: ORAL_TABLET | ORAL | 3 refills | Status: DC

## 2023-04-12 NOTE — Patient Instructions (Signed)
Continue current medications Continue to follow up with your doctors  Return in a year or sooner if worse

## 2023-04-12 NOTE — Progress Notes (Signed)
[facility]  PATIENT: Jeff [name] DOB: April 06, 1966  REQUESTING CLINICIAN: Rodrigo Ran, MD HISTORY FROM: Patient, mother and chart review  REASON FOR VISIT: Multiple strokes in the setting of DKA   HISTORICAL  CHIEF COMPLAINT:  Chief Complaint  Patient presents with   Follow-up    Rm 12, sz follow, no seizures that he is aware of, not driving due  to diabetic retinopathy   INTERVAL HISTORY 04/12/2023 Patient presents today for follow-up, last visit was in April and since then he has been doing well.  No seizure or seizure activity.  In term of the stroke, again he does not have any deficits from it.  He tells me that he is doing well in terms of diabetes, her and with A1c is less than 6.5.  Unfortunately due to his diabetes retinopathy and his poor vision, he is no longer driving. He reports compliance with all of his medications.    HISTORY OF PRESENT ILLNESS:  This is a 57 year old gentleman with past medical history of diabetes type 1 on insulin, CAD, hypertension, hyperlipidemia, neurofibromatosis type I and seizure disorder who is presenting after being admitted to the hospital for DKA and found to have multiple strokes.  Patient presented to the hospital on February 19 due to altered mental status.  At that time he was found to be in severe DKA.  DKA was due to his insulin pump malfunction.  He was treated appropriately, but he was still found to have altered mental status, MRI brain was obtained and patient was found to have multiple punctate bilateral acute and subacute strokes.  Stroke etiology was secondary to dehydration due to DKA.  He was recommended DAPT for 21 days and continue with Plavix indefinitely.  He completed DAPT and currently on Plavix.  He denies any deficit, feel like he is back to his baseline.   Hospital course and summary  Patient is a 57 year old male with type I DM, on insulin pump at home, CAD with previous CABG and PCI, HTN, HLP,  neurofibromatosis type I and seizure disorder who presented to the emergency room on 2/19 with altered mental status.  He was not feeling well for the last 24 hours.  He was found down in the bathroom.  In ED, found to be confused, slow to respond to questions, CBGs > 600 Insulin pump was removed on arrival and cannula was found to be kinked.  Due to significant findings he was admitted to intensive care unit and treated for DKA with insulin drip, IV fluids.  Troponins more than 3000. WBC was 26,000, lactic acid was more than 9. 2/22, patient continues to improve however with intermittent confusion and difficulties with balance, MRI showed multiple areas of punctate infarction. Neurology consulted Acute metabolic encephalopathy Acute arterial ischemic stroke, multifocal, mult vascular territories [facility]) -CT head on 2/19 without acute findings. -MRI brain showed multiple punctate foci of abnormal diffusion restriction scattered within both cerebral hemispheres, consistent with acute to subacute infarct, no hemorrhage or mass effect.  Unchanged left scalp neurofibroma.   -CTA head and neck showed no emergent large vessel occlusion or hemodynamically significant stenosis of head and neck -2D echo showed EF 60 to 65%, G1 DD, borderline dilatation of aortic root measuring 37 mm -EEG normal, no seizures or epileptiform discharges - PT evaluation recommended home health PT OT Lipid panel showed triglycerides 157, LDL 25, HDL 39 - Neurology recommend DAPT, discussed with Dr. Pearlean Brownie, recommended aspirin 81 mg daily and Plavix for  21 days, then continue Plavix indefinitely -Continue statin  OTHER MEDICAL CONDITIONS: NF1, DM I, CAD, HTN, HLD, Seizures    REVIEW OF SYSTEMS: Full 14 system review of systems performed and negative with exception of: As noted in the HPI   ALLERGIES: Allergies  Allergen Reactions   Doxepin Hcl     Other reaction(s): did not work and had some extra nocturia   Venlafaxine      Other reaction(s): Too much sedation   Latex Rash    HOME MEDICATIONS: Outpatient Medications Prior to Visit  Medication Sig Dispense Refill   ACCU-CHEK GUIDE test strip      Accu-Chek Softclix Lancets lancets      acetaminophen (TYLENOL) 500 MG tablet Take 1,000 mg by mouth every 6 (six) hours as needed for mild pain.     Alirocumab (PRALUENT) 150 MG/ML SOAJ Inject 150 mg into the skin every 14 (fourteen) days. Every other Tuesday     clopidogrel (PLAVIX) 75 MG tablet Take 1 tablet (75 mg total) by mouth daily. 90 tablet 3   Continuous Blood Gluc Sensor (FREESTYLE LIBRE 3 SENSOR) MISC 1 Device by Does not apply route as needed (check blood glucose as needed/directed). Check blood glucose as needed/directed     cyanocobalamin 1000 MCG tablet Take 1,000 mcg by mouth daily.     desvenlafaxine (PRISTIQ) 100 MG 24 hr tablet Take 100 mg by mouth daily.     docusate sodium (COLACE) 100 MG capsule Take 100 mg by mouth at bedtime.     ezetimibe (ZETIA) 10 MG tablet Take 10 mg by mouth at bedtime.     insulin aspart (NOVOLOG) 100 UNIT/ML injection Inject 100 Units into the skin. Use per pump as needed     isosorbide mononitrate (IMDUR) 60 MG 24 hr tablet Take 60 mg by mouth every morning.      lisinopril (PRINIVIL,ZESTRIL) 5 MG tablet Take 5 mg by mouth every morning.      metoprolol tartrate (LOPRESSOR) 25 MG tablet TAKE 1 TABLET(25 MG) BY MOUTH TWICE DAILY 180 tablet 2   Multiple Vitamins-Minerals (CENTRUM SILVER 50+MEN) TABS Take 1 tablet by mouth every morning.     OXYGEN Place 2 L into the nose at bedtime.     promethazine (PHENERGAN) 25 MG tablet Take 25 mg by mouth every 6 (six) hours as needed for nausea or vomiting.     rosuvastatin (CRESTOR) 40 MG tablet Take 40 mg by mouth at bedtime.      tamsulosin (FLOMAX) 0.4 MG CAPS capsule Take 1 capsule (0.4 mg total) by mouth daily. 30 capsule 3   TEGRETOL-XR 200 MG 12 hr tablet TAKE 3 TABLETS BY MOUTH TWO TIMES A DAY. 540 tablet 3    levETIRAcetam (KEPPRA) 750 MG tablet TAKE 1 TABLET(750 MG) BY MOUTH TWICE DAILY 180 tablet 1   Cholecalciferol (VITAMIN D) 2000 units CAPS Take 2,000 Units by mouth daily.      nitroGLYCERIN (NITROSTAT) 0.4 MG SL tablet Place 1 tablet (0.4 mg total) under the tongue every 5 (five) minutes as needed for chest pain. 25 tablet 5   No facility-administered medications prior to visit.    PAST MEDICAL HISTORY: Past Medical History:  Diagnosis Date   Anxiety    CAD (coronary artery disease)    Cath 2011.  LAD stent patent, distal LAD occlusion, D1 100%, OM branch 80 - 90%, SVG to diag patent, LIMA to LAD occluded, SVG to RCA occluded, SVG to OM was occluded.   No change  from previous cath.  Managed medically.    Cataract    Depression    Diabetes mellitus type I (HCC)    on insulin pump at  home   Diabetic retinopathy    s/p vitrectomy and history of retinal surgery   Hemorrhoid    History of oxygen administration    nocutural use only at 2 l/m nasally.   HTN (hypertension)    Hyperlipidemia    Myocardial infarction [facility])    Neurofibromatosis    with neurofibroma lesion at the base of the skull   Seasonal allergic rhinitis    Seizure disorder [facility])    Sleep apnea    s/p oral surgery   Stroke [facility])    Toe fracture, right    11-23-14 fracture right big toe-wearing a stability shoe    PAST SURGICAL HISTORY: Past Surgical History:  Procedure Laterality Date   APPENDECTOMY     CARDIAC CATHETERIZATION     CATARACT EXTRACTION, BILATERAL     COLONOSCOPY WITH PROPOFOL N/A 07/21/2017   Procedure: COLONOSCOPY WITH PROPOFOL;  Surgeon: [name], MD;  Location: [facility] END[facility]COPY;  Service: Endoscopy;  Laterality: N/A;   CORONARY ANGIOPLASTY     9'11,  CABPG grafts had failed.   CORONARY ARTERY BYPASS GRAFT     4 vessel 4/11   ESOPHAGOGASTRODUODENOSCOPY (EGD) WITH PROPOFOL N/A 12/05/2014   Procedure: ESOPHAGOGASTRODUODENOSCOPY (EGD) WITH PROPOFOL;  Surgeon: [name], MD;  Location:  [facility] END[facility]COPY;  Service: Endoscopy;  Laterality: N/A;   LAPAROSCOPIC APPENDECTOMY  11/24/2011   LEFT HEART CATHETERIZATION WITH CORONARY/GRAFT ANGIOGRAM N/A 02/22/2013   Procedure: LEFT HEART CATHETERIZATION WITH [name];  Surgeon: Tonny Bollman, MD;  Location: [facility] CATH LAB;  Service: Cardiovascular;  Laterality: N/A;   release of right transverse carpal ligament     right eye vitrectomy and detached retina repair  10/2007   TONSILLECTOMY     uvulopalatoplasty surgery      FAMILY HISTORY: Family History  Problem Relation Age of Onset   Crohn's disease Mother    Hearing loss Mother    Coronary artery disease Father 68   Diabetes Father        type II   Hypertension Father    Arthritis Sister    Breast cancer Sister    Squamous cell carcinoma Brother     SOCIAL HISTORY: Social History   Socioeconomic History   Marital status: Married    Spouse name: Almyra Free   Number of children: 0   Years of education: college   Highest education level: Not on file  Occupational History   Occupation: Unemployed    Comment: previously worked in Airline pilot  Tobacco Use   Smoking status: Never   Smokeless tobacco: Never  Vaping Use   Vaping status: Never Used  Substance and Sexual Activity   Alcohol use: No   Drug use: No   Sexual activity: Yes  Other Topics Concern   Not on file  Social History Narrative   Patient lives with his wifeLanora Manis).   Patient does not have any children.   Patient is on disability.   Patient is left-handed (writes), but right hand with sports.   Patient has a college education.   Patient drinks 5-6 cups of caffeine daily.   Social Determinants of Health   Financial Resource Strain: Not on file  Food Insecurity: No Food Insecurity (08/16/2022)   Hunger Vital Sign    Worried About Running Out of Food in the Last Year: Never true  Ran Out of Food in the Last Year: Never true  Transportation Needs: No Transportation Needs (08/26/2022)    PRAPARE - Administrator, Civil Service (Medical): No    Lack of Transportation (Non-Medical): No  Physical Activity: Not on file  Stress: Not on file  Social Connections: Not on file  Intimate Partner Violence: Not At Risk (08/16/2022)   Humiliation, Afraid, Rape, and Kick questionnaire    Fear of Current or Ex-Partner: No    Emotionally Abused: No    Physically Abused: No    Sexually Abused: No     PHYSICAL EXAM  GENERAL EXAM/CONSTITUTIONAL: Vitals:  Vitals:   04/12/23 1313  BP: 122/68  Weight: 237 lb (107.5 kg)  Height: 5\' 11"  (1.803 m)   Body mass index is 33.05 kg/m. Wt Readings from Last 3 Encounters:  04/12/23 237 lb (107.5 kg)  03/21/23 235 lb (106.6 kg)  10/05/22 233 lb (105.7 kg)   Patient is in no distress; well developed, nourished and groomed; neck is supple  EYES: Constricted vision field, loss of both peripheral vision, Extraocular movements intacts,   MUSCULOSKELETAL: Gait, strength, tone, movements noted in Neurologic exam below  NEUROLOGIC: MENTAL STATUS:      No data to display         awake, alert, oriented to person, place and time recent and remote memory intact normal attention and concentration language fluent, comprehension intact, naming intact fund of knowledge appropriate  CRANIAL NERVE:  2nd, 3rd, 4th, 6th - Constricted vision field, loss of both peripheral vision, extraocular muscles intact, no nystagmus 5th - facial sensation symmetric 7th - facial strength symmetric 8th - hearing intact 9th - palate elevates symmetrically, uvula midline 11th - shoulder shrug symmetric 12th - tongue protrusion midline  MOTOR:  normal bulk and tone, full strength in the BUE, BLE  SENSORY:  normal and symmetric to light touch  COORDINATION:  Overshooting with finger-nose-finger, fine finger movements normal  GAIT/STATION:  normal   DIAGNOSTIC DATA (LABS, IMAGING, TESTING) - I reviewed patient records, labs, notes,  testing and imaging myself where available.  Lab Results  Component Value Date   WBC 11.7 (H) 08/18/2022   HGB 12.9 (L) 08/18/2022   HCT 37.3 (L) 08/18/2022   MCV 89.9 08/18/2022   PLT 160 08/18/2022      Component Value Date/Time   NA 139 08/22/2022 0116   NA 141 04/22/2021 1602   K 3.6 08/22/2022 0116   CL 104 08/22/2022 0116   CO2 23 08/22/2022 0116   GLUCOSE 70 08/22/2022 0116   BUN 6 08/22/2022 0116   BUN 9 04/22/2021 1602   CREATININE 0.66 08/22/2022 0116   CALCIUM 8.5 (L) 08/22/2022 0116   PROT 5.7 (L) 08/16/2022 0035   PROT 6.1 04/22/2021 1602   ALBUMIN 3.5 08/16/2022 0035   ALBUMIN 4.3 04/22/2021 1602   AST 50 (H) 08/16/2022 0035   ALT 35 08/16/2022 0035   ALKPHOS 89 08/16/2022 0035   BILITOT 0.9 08/16/2022 0035   BILITOT 0.3 04/22/2021 1602   GFRNONAA >60 08/22/2022 0116   GFRAA >60 03/09/2018 1707   Lab Results  Component Value Date   CHOL 95 08/21/2022   HDL 39 (L) 08/21/2022   LDLCALC 25 08/21/2022   TRIG 157 (H) 08/21/2022   CHOLHDL 2.4 08/21/2022   Lab Results  Component Value Date   HGBA1C 7.4 (H) 08/16/2022   No results found for: "VITAMINB12" Lab Results  Component Value Date   TSH 3.749 08/16/2022  MRI Brain 08/19/2022 1. Multiple punctate foci of abnormal diffusion restriction scattered within both cerebral hemispheres, consistent with acute to subacute infarcts. No hemorrhage or mass effect. 2. Findings of chronic microvascular ischemia and volume loss. 3. Unchanged left scalp lesion, likely neurofibroma   EEG 08/20/2022 This study is within normal limits. No seizures or epileptiform discharges were seen throughout the recording.    ASSESSMENT AND PLAN  57 y.o. year old male with history of diabetes type 1 on insulin, CAD, hypertension, hyperlipidemia, neurofibromatosis type I and seizure disorder who is presenting for follow up.  In term of his stroke, he does not have any deficits.  He is compliant with his medications.  Again in  terms of his seizures, last seizure was documented 2019.  He is compliant with his Keppra and Tegretol.  No other complaints, no other concerns.    1. Cerebrovascular accident (CVA) due to bilateral occlusion of middle cerebral arteries (HCC)   2. Partial epilepsy with impairment of consciousness (HCC)   3. Neurofibromatosis, type 1 [facility])       Patient Instructions  Continue current medications Continue to follow up with your doctors  Return in a year or sooner if worse   No orders of the defined types were placed in this encounter.   Meds ordered this encounter  Medications   levETIRAcetam (KEPPRA) 750 MG tablet    Sig: TAKE 1 TABLET(750 MG) BY MOUTH TWICE DAILY    Dispense:  180 tablet    Refill:  3    Return in about 1 year (around 04/11/2024).  I have spent a total of 45 minutes dedicated to this patient today, preparing to see patient, performing a medically appropriate examination and evaluation, ordering tests and/or medications and procedures, and counseling and educating the patient/family/caregiver; independently interpreting result and communicating results to the family/patient/caregiver; and documenting clinical information in the electronic medical record.   Windell Norfolk, MD 04/12/2023, 2:16 PM  [facility] [address], [address] [place], [place] [address] [phone]

## 2023-04-14 ENCOUNTER — Telehealth: Payer: Self-pay | Admitting: Pharmacist

## 2023-04-14 NOTE — Telephone Encounter (Signed)
   04/14/2023  Jeff [name] September 09, 1965 [id number]   2025 Medication Assistance Renewal Application Summary:  Patient was outreached regarding medication assistance renewal for 2025. Verified address, anticipated insurance for 2025, and income has not changed. Patient remains interested in PAP for 2025 for Novolog and Praluent, no other new medications were identified for medication assistance.   Renewed HealthWell Grant: Processed HealthWell Grant renewal via provider portal with patient on phone 04/14/2023.  New Card information:  Card #: [id number] BIN: [id number] PCN: [id number] PC Group: [id number] Start date: 05/02/2023 End Date: 04/30/2024 Amount: $2500.00  Plan: I will route patient assistance letter to pharmacy technician who will coordinate patient assistance program application process for medications listed above.  Pharmacy technician will assist with obtaining all required documents from both patient and provider(s) and submit application(s) once completed.    Thank you for allowing pharmacy to be a part of this patient's care.  Reynold Bowen, PharmD Clinical Pharmacist [place] Direct Dial: [phone]

## 2023-04-15 DIAGNOSIS — I10 Essential (primary) hypertension: Secondary | ICD-10-CM | POA: Diagnosis not present

## 2023-04-15 DIAGNOSIS — Z794 Long term (current) use of insulin: Secondary | ICD-10-CM | POA: Diagnosis not present

## 2023-04-15 DIAGNOSIS — I2510 Atherosclerotic heart disease of native coronary artery without angina pectoris: Secondary | ICD-10-CM | POA: Diagnosis not present

## 2023-04-15 DIAGNOSIS — E1039 Type 1 diabetes mellitus with other diabetic ophthalmic complication: Secondary | ICD-10-CM | POA: Diagnosis not present

## 2023-04-20 ENCOUNTER — Ambulatory Visit: Payer: Medicare PPO | Admitting: Neurology

## 2023-04-21 ENCOUNTER — Ambulatory Visit: Payer: Medicare PPO | Admitting: Neurology

## 2023-04-26 DIAGNOSIS — F429 Obsessive-compulsive disorder, unspecified: Secondary | ICD-10-CM | POA: Diagnosis not present

## 2023-04-27 DIAGNOSIS — E1039 Type 1 diabetes mellitus with other diabetic ophthalmic complication: Secondary | ICD-10-CM | POA: Diagnosis not present

## 2023-04-28 ENCOUNTER — Telehealth: Payer: Self-pay | Admitting: Pharmacy Technician

## 2023-04-28 DIAGNOSIS — Z5986 Financial insecurity: Secondary | ICD-10-CM

## 2023-04-28 NOTE — Progress Notes (Signed)
[facility] Customer service manager [facility])                                            [facility] Quality Pharmacy Team    04/28/2023  DASHAN KINLOCK 08-15-1965 [id number]                                      Medication Assistance Referral  Referral From: [facility] RPh Tiffany B.  Medication/Company: [employer] / [employer] Patient application portion:  Mining engineer portion: Faxed  to Dr. Rodrigo Ran Provider address/fax verified via: Office website  Pattricia Boss, CPhT   Office: [phone] Fax: [phone] Email: [email or web address]

## 2023-05-02 ENCOUNTER — Other Ambulatory Visit: Payer: Self-pay | Admitting: Cardiovascular Disease

## 2023-05-02 ENCOUNTER — Telehealth: Payer: Self-pay | Admitting: Pharmacist

## 2023-05-02 DIAGNOSIS — I10 Essential (primary) hypertension: Secondary | ICD-10-CM

## 2023-05-02 DIAGNOSIS — I25719 Atherosclerosis of autologous vein coronary artery bypass graft(s) with unspecified angina pectoris: Secondary | ICD-10-CM

## 2023-05-02 DIAGNOSIS — E785 Hyperlipidemia, unspecified: Secondary | ICD-10-CM

## 2023-05-02 NOTE — Progress Notes (Signed)
Placed call to [facility] and provided new HealthWell [place] information for billing. New card effective 05/02/2023-04/30/2024.    Reynold Bowen, PharmD Clinical Pharmacist [place] Direct Dial: [phone]

## 2023-05-13 DIAGNOSIS — R0902 Hypoxemia: Secondary | ICD-10-CM | POA: Diagnosis not present

## 2023-05-30 ENCOUNTER — Telehealth: Payer: Self-pay | Admitting: *Deleted

## 2023-05-30 NOTE — Progress Notes (Signed)
  Care Coordination Note  05/30/2023 Name: Jeff [name] MRN: [id number] DOB: 1965/07/22  Jeff [name] is a 57 y.o. year old male who is a primary care patient of Perini, Loraine Leriche, MD and is actively engaged with the care management team. I reached out to Jeff [name] by phone today to assist with re-scheduling a follow up visit with the RN Case Manager  Follow up plan: Telephone appointment with care management team member scheduled for:06/17/23  [facility] Coordination Care Guide  Direct Dial: [phone]

## 2023-05-30 NOTE — Progress Notes (Signed)
  Care Coordination Note  05/30/2023 Name: [name] MRN: [id number] DOB: 10-15-1965  Jeff [name] is a 57 y.o. year old male who is a primary care patient of Perini, Loraine Leriche, MD and is actively engaged with the care management team. I reached out to Jeff [name] by phone today to assist with re-scheduling a follow up visit with the RN Case Manager  Follow up plan: Unsuccessful telephone outreach attempt made. A HIPAA compliant phone message was left for the patient providing contact information and requesting a return call.   [facility]  Care Coordination Care Guide  Direct Dial: [phone]

## 2023-06-01 DIAGNOSIS — F429 Obsessive-compulsive disorder, unspecified: Secondary | ICD-10-CM | POA: Diagnosis not present

## 2023-06-02 DIAGNOSIS — E1039 Type 1 diabetes mellitus with other diabetic ophthalmic complication: Secondary | ICD-10-CM | POA: Diagnosis not present

## 2023-06-02 DIAGNOSIS — E291 Testicular hypofunction: Secondary | ICD-10-CM | POA: Diagnosis not present

## 2023-06-02 DIAGNOSIS — K922 Gastrointestinal hemorrhage, unspecified: Secondary | ICD-10-CM | POA: Diagnosis not present

## 2023-06-02 DIAGNOSIS — R6884 Jaw pain: Secondary | ICD-10-CM | POA: Diagnosis not present

## 2023-06-02 DIAGNOSIS — E785 Hyperlipidemia, unspecified: Secondary | ICD-10-CM | POA: Diagnosis not present

## 2023-06-02 DIAGNOSIS — G9341 Metabolic encephalopathy: Secondary | ICD-10-CM | POA: Diagnosis not present

## 2023-06-02 DIAGNOSIS — I10 Essential (primary) hypertension: Secondary | ICD-10-CM | POA: Diagnosis not present

## 2023-06-02 DIAGNOSIS — Z1212 Encounter for screening for malignant neoplasm of rectum: Secondary | ICD-10-CM | POA: Diagnosis not present

## 2023-06-02 DIAGNOSIS — Z125 Encounter for screening for malignant neoplasm of prostate: Secondary | ICD-10-CM | POA: Diagnosis not present

## 2023-06-07 ENCOUNTER — Other Ambulatory Visit: Payer: Self-pay | Admitting: Neurology

## 2023-06-08 DIAGNOSIS — Z0000 Encounter for general adult medical examination without abnormal findings: Secondary | ICD-10-CM | POA: Diagnosis not present

## 2023-06-08 DIAGNOSIS — I209 Angina pectoris, unspecified: Secondary | ICD-10-CM | POA: Diagnosis not present

## 2023-06-08 DIAGNOSIS — E1040 Type 1 diabetes mellitus with diabetic neuropathy, unspecified: Secondary | ICD-10-CM | POA: Diagnosis not present

## 2023-06-08 DIAGNOSIS — E1039 Type 1 diabetes mellitus with other diabetic ophthalmic complication: Secondary | ICD-10-CM | POA: Diagnosis not present

## 2023-06-08 DIAGNOSIS — N3281 Overactive bladder: Secondary | ICD-10-CM | POA: Diagnosis not present

## 2023-06-08 DIAGNOSIS — Z1339 Encounter for screening examination for other mental health and behavioral disorders: Secondary | ICD-10-CM | POA: Diagnosis not present

## 2023-06-08 DIAGNOSIS — R82998 Other abnormal findings in urine: Secondary | ICD-10-CM | POA: Diagnosis not present

## 2023-06-08 DIAGNOSIS — Z1331 Encounter for screening for depression: Secondary | ICD-10-CM | POA: Diagnosis not present

## 2023-06-08 DIAGNOSIS — E785 Hyperlipidemia, unspecified: Secondary | ICD-10-CM | POA: Diagnosis not present

## 2023-06-08 DIAGNOSIS — I10 Essential (primary) hypertension: Secondary | ICD-10-CM | POA: Diagnosis not present

## 2023-06-08 DIAGNOSIS — F339 Major depressive disorder, recurrent, unspecified: Secondary | ICD-10-CM | POA: Diagnosis not present

## 2023-06-08 DIAGNOSIS — I2510 Atherosclerotic heart disease of native coronary artery without angina pectoris: Secondary | ICD-10-CM | POA: Diagnosis not present

## 2023-06-12 DIAGNOSIS — R0902 Hypoxemia: Secondary | ICD-10-CM | POA: Diagnosis not present

## 2023-06-16 ENCOUNTER — Telehealth: Payer: Self-pay | Admitting: Pharmacy Technician

## 2023-06-16 DIAGNOSIS — Z5986 Financial insecurity: Secondary | ICD-10-CM

## 2023-06-16 NOTE — Progress Notes (Signed)
Pharmacy Medication Assistance Program Note    06/16/2023  Patient ID: Jeff [name], male   DOB: Aug 15, 1965, 57 y.o.   MRN: [id number]     06/16/2023  Outreach Medication One  Initial Outreach Date (Medication One) 04/25/2023  Manufacturer Medication One [employer] [facility] Novolog  Dose of Novolog Vials 100 units/ml  Type of Radiographer, therapeutic Assistance  Date Application Sent to Patient 04/27/2023  Application Items Requested Application;Proof of Income;Other  Date Application Sent to Prescriber 04/27/2023  Name of Prescriber Rodrigo Ran  Date Application Received From Patient 06/16/2023  Application Items Received From Patient Application;Proof of Income;Other  Date Application Received From Provider 05/05/2024  Date Application Submitted to Manufacturer 06/15/2024  Method Application Sent to Manufacturer Fax     Signature   Kristopher Glee [facility]  Office: [phone] Fax: [phone] Email: [email or web address]

## 2023-06-17 ENCOUNTER — Ambulatory Visit: Payer: Self-pay

## 2023-06-20 NOTE — Patient Instructions (Signed)
Visit Information  Thank you for taking time to visit with me today. Please don't hesitate to contact me if I can be of assistance to you.   Following are the goals we discussed today:   Goals Addressed             This Visit's Progress    To get help with finding a new therapist       Care Coordination Interventions: Evaluation of current treatment plan related to depression and patient's adherence to plan as established by provider Discussed with patient his current therapist is no longer accepting insurance, patient reports it is not cost effective to pay up front and wait for reimbursement Determined patient would like assistance with locating a new provider Educated patient about availability of LCSW Jenel Lucks and a referral was sent  Active listening / Reflection utilized  Emotional Support Provided      To reduce risk of hyperglycemia   On track    Care Coordination Interventions: Evaluation of current treatment plan related to type 1 Diabetes and patient's adherence to plan as established by provider Reviewed medications with patient and discussed importance of medication adherence Provided patient with written educational materials related to hypo and hyperglycemia and importance of correct treatment Advised patient, providing education and rationale, to check cbg daily before meals and at bedtime and record, calling PCP for findings outside established parameters Review of patient status, including review of consultants reports, relevant laboratory and other test results, and medications completed Counseled on Diabetic diet, my plate method, 528 minutes of moderate intensity exercise/week Reviewed and discussed with patient his next scheduled follow up with Ronal Fear CDE scheduled for 07/19/23 @1 :30 PM Discussed plans with patient for ongoing care coordination follow up and provided patient with direct contact information for nurse care coordinator Lab Results   Component Value Date   HGBA1C 6.8 (H) 06/02/2023           Our next appointment is by telephone on 08/18/23 at 1:30 PM  Please call the care guide team at [phone] if you need to cancel or reschedule your appointment.   If you are experiencing a Mental Health or Behavioral Health Crisis or need someone to talk to, please call 1-800-273-TALK (toll free, 24 hour hotline)  Patient verbalizes understanding of instructions and care plan provided today and agrees to view in MyChart. Active MyChart status and patient understanding of how to access instructions and care plan via MyChart confirmed with patient.     Delsa Sale RN BSN CCM [place]  [facility], [facility] Nurse Care Coordinator  Direct Dial: [phone] Website: [email or web address]

## 2023-06-20 NOTE — Patient Outreach (Signed)
Care Coordination   Follow Up Visit Note   06/17/2023 Name: Jeff [name] MRN: [id number] DOB: 09/03/1965  Jeff [name] is a 57 y.o. year old male who sees Rodrigo Ran, MD for primary care. I spoke with  Jeff [name] by phone today.  What matters to the patients health and wellness today?  Patient would like assistance locating a new behavioral health provider.     Goals Addressed             This Visit's Progress    To get help with finding a new therapist       Care Coordination Interventions: Evaluation of current treatment plan related to depression and patient's adherence to plan as established by provider Discussed with patient his current therapist is no longer accepting insurance, patient reports it is not cost effective to pay up front and wait for reimbursement Determined patient would like assistance with locating a new provider Educated patient about availability of LCSW Jenel Lucks and a referral was sent  Active listening / Reflection utilized  Emotional Support Provided      To reduce risk of hyperglycemia   On track    Care Coordination Interventions: Evaluation of current treatment plan related to type 1 Diabetes and patient's adherence to plan as established by provider Reviewed medications with patient and discussed importance of medication adherence Provided patient with written educational materials related to hypo and hyperglycemia and importance of correct treatment Advised patient, providing education and rationale, to check cbg daily before meals and at bedtime and record, calling PCP for findings outside established parameters Review of patient status, including review of consultants reports, relevant laboratory and other test results, and medications completed Counseled on Diabetic diet, my plate method, 284 minutes of moderate intensity exercise/week Reviewed and discussed with patient his next scheduled follow up with Ronal Fear CDE  scheduled for 07/19/23 @1 :30 PM Discussed plans with patient for ongoing care coordination follow up and provided patient with direct contact information for nurse care coordinator Lab Results  Component Value Date   HGBA1C 6.8 (H) 06/02/2023       Interventions Today    Flowsheet Row Most Recent Value  Chronic Disease   Chronic disease during today's visit Diabetes, Other  [Hyperlipidemia]  General Interventions   General Interventions Discussed/Reviewed General Interventions Discussed, General Interventions Reviewed, Labs, Doctor Visits, Communication with  Doctor Visits Discussed/Reviewed Doctor Visits Discussed, Doctor Visits Reviewed, PCP  Communication with Social Work  Jenel Lucks LCSW]  Exercise Interventions   Exercise Discussed/Reviewed Physical Activity, Exercise Reviewed, Exercise Discussed  Physical Activity Discussed/Reviewed Physical Activity Reviewed, Physical Activity Discussed  Education Interventions   Education Provided Provided Education, Provided Printed Education  Provided Verbal Education On When to see the doctor, Medication, Labs, Mental Health/Coping with Illness, Nutrition  Labs Reviewed Hgb A1c, Lipid Profile  Mental Health Interventions   Mental Health Discussed/Reviewed Anxiety, Depression, Mental Health Discussed, Mental Health Reviewed  Nutrition Interventions   Nutrition Discussed/Reviewed Nutrition Reviewed, Portion sizes, Carbohydrate meal planning, Fluid intake  Pharmacy Interventions   Pharmacy Dicussed/Reviewed Pharmacy Topics Reviewed, Pharmacy Topics Discussed, Medications and their functions          SDOH assessments and interventions completed:  Yes  SDOH Interventions Today    Flowsheet Row Most Recent Value  SDOH Interventions   Transportation Interventions Intervention Not Indicated  Depression Interventions/Treatment  Currently on Treatment        Care Coordination Interventions:  Yes, provided   Follow up plan:  Referral made to LCSW Jenel Lucks to assist with psychosocial resources and support, call scheduled for 07/01/23 @1 :30 PM Follow up call scheduled for 08/18/23 @1 :30 PM    Encounter Outcome:  Patient Visit Completed

## 2023-07-01 ENCOUNTER — Ambulatory Visit: Payer: Self-pay | Admitting: Licensed Clinical Social Worker

## 2023-07-01 ENCOUNTER — Telehealth: Payer: Self-pay | Admitting: Pharmacy Technician

## 2023-07-01 DIAGNOSIS — Z5986 Financial insecurity: Secondary | ICD-10-CM

## 2023-07-01 NOTE — Patient Outreach (Signed)
  Care Coordination   Initial Visit Note   07/01/2023 Name: Jeff [name] MRN: [id number] DOB: [birth year]  Jeff [name] is a 58 y.o. year old male who sees Shayne Anes, MD for primary care. I spoke with  Jeff [name] by phone today.  What matters to the patients health and wellness today?  Symptom Management    Goals Addressed             This Visit's Progress    Obtain Supportive Resources-MH   On track    Activities and task to complete in order to accomplish goals.   Keep all upcoming appointments discussed today Continue with compliance of taking medication prescribed by Doctor Implement healthy coping skills discussed to assist with management of symptoms Continue working with [facility] care team to assist with goals identified         SDOH assessments and interventions completed:  Yes  SDOH Interventions Today    Flowsheet Row Most Recent Value  SDOH Interventions   Food Insecurity Interventions Intervention Not Indicated  Housing Interventions Intervention Not Indicated        Care Coordination Interventions:  Yes, provided  Interventions Today    Flowsheet Row Most Recent Value  Chronic Disease   Chronic disease during today's visit Diabetes, Hypertension (HTN), Other  [Depression and Anxiety]  General Interventions   General Interventions Discussed/Reviewed General Interventions Discussed, Programmer, Applications, Doctor Visits  Doctor Visits Discussed/Reviewed Doctor Visits Reviewed  Mental Health Interventions   Mental Health Discussed/Reviewed Mental Health Discussed, Coping Strategies, Anxiety, Depression  Nutrition Interventions   Nutrition Discussed/Reviewed Nutrition Discussed  Pharmacy Interventions   Pharmacy Dicussed/Reviewed Pharmacy Topics Discussed, Medication Adherence  Safety Interventions   Safety Discussed/Reviewed Safety Discussed       Follow up plan: Follow up call scheduled for 4-6 weeks    Encounter Outcome:  Patient Visit  Completed   Rolin Kerns, MSW, LCSW [facility] Care Management [facility]  [facility] [email or web address] Phone [phone] 4:25 PM

## 2023-07-01 NOTE — Progress Notes (Signed)
 Pharmacy Medication Assistance Program Note    07/01/2023  Patient ID: Jeff [name], male   DOB: 1965/12/25, 58 y.o.   MRN: [id number]     06/16/2023 07/01/2023  Outreach Medication One  Initial Outreach Date (Medication One) 04/25/2023   Manufacturer Medication One [employer] Drugs Novolog    Dose of Novolog  Vials 100 units/ml   Type of Radiographer, Therapeutic Assistance   Date Application Sent to Patient 04/27/2023   Application Items Requested Application;Proof of Income;Other   Date Application Sent to Prescriber 04/27/2023   Name of Prescriber Oneil Neth   Date Application Received From Patient 06/16/2023   Application Items Received From Patient Application;Proof of Income;Other   Date Application Received From Provider 05/05/2024   Date Application Submitted to Manufacturer 06/15/2024   Method Application Sent to Manufacturer Fax   Patient Assistance Determination  Approved  Approval Start Date  06/29/2023  Approval End Date  06/27/2024  Patient Notification Method  Telephone Call  Telephone Call Outcome  Successful   Care coordination call placed to Novo Nordisk in regard to Novolog  vials application.   Spoke to Tammy who informs patient is APPROVED 06/29/23-06/27/2024. Patient's id number with the program is [id number]. Medication and subsequent refills will process automatically and be shipped to the prescriber's office. Patient may call Novo Nordisk at any time to check on his next shipment by calling [phone].  Successful outreach to patient. HIPAA verified. Patient was informed of his approval and refill procedure.   Jeff [name], Jeff [name] [place]  Office: [phone] Fax: [phone] Email: [email or web address]

## 2023-07-01 NOTE — Patient Instructions (Signed)
 Visit Information  Thank you for taking time to visit with me today. Please don't hesitate to contact me if I can be of assistance to you.   Following are the goals we discussed today:   Goals Addressed             This Visit's Progress    Obtain Supportive Resources-MH   On track    Activities and task to complete in order to accomplish goals.   Keep all upcoming appointments discussed today Continue with compliance of taking medication prescribed by Doctor Implement healthy coping skills discussed to assist with management of symptoms Continue working with [facility] care team to assist with goals identified         Our next appointment is by telephone on 1/31 at 12:30 PM  Please call the care guide team at [phone] if you need to cancel or reschedule your appointment.   If you are experiencing a Mental Health or Behavioral Health Crisis or need someone to talk to, please call the Suicide and Crisis Lifeline: 988 call 911   Patient verbalizes understanding of instructions and care plan provided today and agrees to view in MyChart. Active MyChart status and patient understanding of how to access instructions and care plan via MyChart confirmed with patient.     Richardine Peppers, MSW, LCSW [facility] Care Management [place]  [facility] [email or web address] Phone [phone] 4:25 PM

## 2023-07-13 DIAGNOSIS — R0902 Hypoxemia: Secondary | ICD-10-CM | POA: Diagnosis not present

## 2023-07-19 DIAGNOSIS — Z794 Long term (current) use of insulin: Secondary | ICD-10-CM | POA: Diagnosis not present

## 2023-07-19 DIAGNOSIS — Z4681 Encounter for fitting and adjustment of insulin pump: Secondary | ICD-10-CM | POA: Diagnosis not present

## 2023-07-19 DIAGNOSIS — L820 Inflamed seborrheic keratosis: Secondary | ICD-10-CM | POA: Diagnosis not present

## 2023-07-19 DIAGNOSIS — E1039 Type 1 diabetes mellitus with other diabetic ophthalmic complication: Secondary | ICD-10-CM | POA: Diagnosis not present

## 2023-07-19 DIAGNOSIS — I2510 Atherosclerotic heart disease of native coronary artery without angina pectoris: Secondary | ICD-10-CM | POA: Diagnosis not present

## 2023-07-19 DIAGNOSIS — I10 Essential (primary) hypertension: Secondary | ICD-10-CM | POA: Diagnosis not present

## 2023-07-26 DIAGNOSIS — E1039 Type 1 diabetes mellitus with other diabetic ophthalmic complication: Secondary | ICD-10-CM | POA: Diagnosis not present

## 2023-07-29 ENCOUNTER — Encounter: Payer: Self-pay | Admitting: Licensed Clinical Social Worker

## 2023-08-03 ENCOUNTER — Telehealth: Payer: Self-pay | Admitting: Licensed Clinical Social Worker

## 2023-08-03 NOTE — Patient Outreach (Signed)
  Care Coordination   07/29/2023 Name: Jeff [name] MRN: [id number] DOB: Jul 21, 1965   Care Coordination Outreach Attempts:  An unsuccessful outreach was attempted for an appointment today.  Follow Up Plan:  Additional outreach attempts will be made to offer the patient complex care management information and services.   Encounter Outcome:  No Answer   Care Coordination Interventions:  No, not indicated    Rolin Kerns, LCSW [place]  [facility], [facility] Clinical Social Worker Direct Dial: [phone]  Fax: [phone] Website: [email or web address] 4:39 PM

## 2023-08-13 DIAGNOSIS — R0902 Hypoxemia: Secondary | ICD-10-CM | POA: Diagnosis not present

## 2023-08-18 ENCOUNTER — Ambulatory Visit: Payer: Self-pay

## 2023-08-18 NOTE — Patient Outreach (Signed)
Care Coordination   08/18/2023 Name: MAYANK TEUSCHER MRN: [id number] DOB: 1965-09-28   Care Coordination Outreach Attempts:  An unsuccessful outreach was attempted for an appointment today.  Follow Up Plan:  Additional outreach attempts will be made to offer the patient complex care management information and services.   Encounter Outcome:  No Answer   Care Coordination Interventions:  No, not indicated    Delsa Sale RN BSN CCM [place]  [facility], [facility] Health Nurse Care Coordinator  Direct Dial: [phone] Website: [email or web address]

## 2023-08-19 DIAGNOSIS — R110 Nausea: Secondary | ICD-10-CM | POA: Diagnosis not present

## 2023-08-19 DIAGNOSIS — R630 Anorexia: Secondary | ICD-10-CM | POA: Diagnosis not present

## 2023-08-19 DIAGNOSIS — R5383 Other fatigue: Secondary | ICD-10-CM | POA: Diagnosis not present

## 2023-08-19 DIAGNOSIS — R051 Acute cough: Secondary | ICD-10-CM | POA: Diagnosis not present

## 2023-08-19 DIAGNOSIS — R1110 Vomiting, unspecified: Secondary | ICD-10-CM | POA: Diagnosis not present

## 2023-08-19 DIAGNOSIS — E1051 Type 1 diabetes mellitus with diabetic peripheral angiopathy without gangrene: Secondary | ICD-10-CM | POA: Diagnosis not present

## 2023-08-19 DIAGNOSIS — Z1152 Encounter for screening for COVID-19: Secondary | ICD-10-CM | POA: Diagnosis not present

## 2023-08-19 DIAGNOSIS — R197 Diarrhea, unspecified: Secondary | ICD-10-CM | POA: Diagnosis not present

## 2023-09-01 DIAGNOSIS — H3321 Serous retinal detachment, right eye: Secondary | ICD-10-CM | POA: Diagnosis not present

## 2023-09-01 DIAGNOSIS — H47293 Other optic atrophy, bilateral: Secondary | ICD-10-CM | POA: Diagnosis not present

## 2023-09-01 DIAGNOSIS — Z961 Presence of intraocular lens: Secondary | ICD-10-CM | POA: Diagnosis not present

## 2023-09-01 DIAGNOSIS — H35371 Puckering of macula, right eye: Secondary | ICD-10-CM | POA: Diagnosis not present

## 2023-09-01 DIAGNOSIS — E103593 Type 1 diabetes mellitus with proliferative diabetic retinopathy without macular edema, bilateral: Secondary | ICD-10-CM | POA: Diagnosis not present

## 2023-09-05 ENCOUNTER — Encounter: Payer: Self-pay | Admitting: Cardiovascular Disease

## 2023-09-05 ENCOUNTER — Ambulatory Visit: Payer: Medicare PPO | Attending: Cardiovascular Disease | Admitting: Cardiovascular Disease

## 2023-09-05 VITALS — BP 108/64 | HR 78 | Ht 71.0 in | Wt 238.8 lb

## 2023-09-05 DIAGNOSIS — E782 Mixed hyperlipidemia: Secondary | ICD-10-CM

## 2023-09-05 DIAGNOSIS — I25119 Atherosclerotic heart disease of native coronary artery with unspecified angina pectoris: Secondary | ICD-10-CM

## 2023-09-05 DIAGNOSIS — I10 Essential (primary) hypertension: Secondary | ICD-10-CM | POA: Diagnosis not present

## 2023-09-05 NOTE — Progress Notes (Signed)
 Cardiology Office Note:    Date:  09/05/2023   ID:  Jeff [name], DOB [birth year], MRN [id number]  PCP:  Rodrigo Ran, MD   [facility] Providers Cardiologist:  Tonny Bollman, MD     Referring MD: Rodrigo Ran, MD   Chief Complaint  Patient presents with   Coronary Artery Disease    History of Present Illness:    Jeff [name] is a 58 y.o. male presenting for follow-up of coronary artery disease.  The patient underwent CABG in 2011 with surgery complicated by early bypass graft failure.  He underwent multivessel stenting thereafter with most recent heart catheterization in 2014 showing patency of his coronary stents and diffuse small vessel disease with recommendations for medical therapy.  The patient is here alone today. Today, he denies symptoms of palpitations, chest pain, shortness of breath, orthopnea, PND, lower extremity edema, dizziness, or syncope.  He has gained a little weight since his last visit and states that he has been pretty sedentary.  He feels like he will be more active after basketball season is over because he likes to watch a lot of college basketball.  He also thinks the time change will help him as the days are no longer.  Current Medications: Current Meds  Medication Sig   ACCU-CHEK GUIDE test strip    Accu-Chek Softclix Lancets lancets    acetaminophen (TYLENOL) 500 MG tablet Take 1,000 mg by mouth every 6 (six) hours as needed for mild pain.   Alirocumab (PRALUENT) 150 MG/ML SOAJ Inject 150 mg into the skin every 14 (fourteen) days. Every other Tuesday   Cholecalciferol (VITAMIN D) 2000 units CAPS Take 2,000 Units by mouth daily.    clopidogrel (PLAVIX) 75 MG tablet Take 1 tablet (75 mg total) by mouth daily.   Continuous Blood Gluc Sensor (FREESTYLE LIBRE 3 SENSOR) MISC 1 Device by Does not apply route as needed (check blood glucose as needed/directed). Check blood glucose as needed/directed   cyanocobalamin 1000 MCG tablet Take 1,000 mcg  by mouth daily.   desvenlafaxine (PRISTIQ) 100 MG 24 hr tablet Take 100 mg by mouth daily.   docusate sodium (COLACE) 100 MG capsule Take 100 mg by mouth at bedtime.   ezetimibe (ZETIA) 10 MG tablet Take 10 mg by mouth at bedtime.   insulin aspart (NOVOLOG) 100 UNIT/ML injection Inject 100 Units into the skin. Use per pump as needed   isosorbide mononitrate (IMDUR) 60 MG 24 hr tablet Take 60 mg by mouth every morning.    levETIRAcetam (KEPPRA) 750 MG tablet TAKE 1 TABLET(750 MG) BY MOUTH TWICE DAILY   lisinopril (PRINIVIL,ZESTRIL) 5 MG tablet Take 5 mg by mouth every morning.    metoprolol tartrate (LOPRESSOR) 25 MG tablet TAKE 1 TABLET(25 MG) BY MOUTH TWICE DAILY   Multiple Vitamins-Minerals (CENTRUM SILVER 50+MEN) TABS Take 1 tablet by mouth every morning.   nitroGLYCERIN (NITROSTAT) 0.4 MG SL tablet Place 1 tablet (0.4 mg total) under the tongue every 5 (five) minutes as needed for chest pain.   OXYGEN Place 2 L into the nose at bedtime.   promethazine (PHENERGAN) 25 MG tablet Take 25 mg by mouth every 6 (six) hours as needed for nausea or vomiting.   rosuvastatin (CRESTOR) 40 MG tablet Take 40 mg by mouth at bedtime.    tamsulosin (FLOMAX) 0.4 MG CAPS capsule Take 1 capsule (0.4 mg total) by mouth daily.   TEGRETOL-XR 200 MG 12 hr tablet TAKE 3 TABLETS BY MOUTH TWICE DAILY  Allergies:   Doxepin hcl, Venlafaxine, and Latex   ROS:   Please see the history of present illness.    All other systems reviewed and are negative.  EKGs/Labs/Other Studies Reviewed:    The following studies were reviewed today: Cardiac Studies & Procedures   ______________________________________________________________________________________________   STRESS TESTS  MYOCARDIAL PERFUSION IMAGING 04/11/2018  Narrative  Nuclear stress EF: 70%.  The left ventricular ejection fraction is hyperdynamic (>65%).  There was no ST segment deviation noted during stress. Transient second degree and third  degree AV block were noted during infusion.  The study is normal.  This is a low risk study.   ECHOCARDIOGRAM  ECHOCARDIOGRAM COMPLETE 08/20/2022  Narrative ECHOCARDIOGRAM REPORT    Patient Name:   Jeff [name] Date of Exam: 08/20/2022 Medical Rec #:  [id number]       Height:       71.0 in Accession #:    [id number]      Weight:       223.3 lb Date of Birth:  1965-10-23        BSA:          2.210 m Patient Age:    56 years        BP:           132/69 mmHg Patient Gender: M               HR:           71 bpm. Exam Location:  Inpatient  Procedure: 2D Echo  Indications:    Stroke  History:        Patient has prior history of Echocardiogram examinations. CAD, Prior CABG; Risk Factors:Hypertension, Diabetes and Dyslipidemia.  Sonographer:    Lucendia Herrlich Referring Phys: [id number] KUBER GHIMIRE  IMPRESSIONS   1. Left ventricular ejection fraction, by estimation, is 60 to 65%. The left ventricle has normal function. The left ventricle has no regional wall motion abnormalities. There is mild concentric left ventricular hypertrophy. Left ventricular diastolic parameters are consistent with Grade I diastolic dysfunction (impaired relaxation). 2. Right ventricular systolic function is normal. The right ventricular size is not well visualized. 3. Left atrial size was mildly dilated. 4. The mitral valve is abnormal. Trivial mitral valve regurgitation. 5. The aortic valve is tricuspid. There is mild calcification of the aortic valve. There is mild thickening of the aortic valve. Aortic valve regurgitation is not visualized. Aortic valve sclerosis/calcification is present, without any evidence of aortic stenosis. 6. Aortic dilatation noted. There is borderline dilatation of the aortic root, measuring 37 mm.  Conclusion(s)/Recommendation(s): No intracardiac source of embolism detected on this transthoracic study. Consider a transesophageal echocardiogram to exclude cardiac source of  embolism if clinically indicated.  FINDINGS Left Ventricle: Left ventricular ejection fraction, by estimation, is 60 to 65%. The left ventricle has normal function. The left ventricle has no regional wall motion abnormalities. The left ventricular internal cavity size was normal in size. There is mild concentric left ventricular hypertrophy. Left ventricular diastolic parameters are consistent with Grade I diastolic dysfunction (impaired relaxation).  Right Ventricle: The right ventricular size is not well visualized. Right vetricular wall thickness was not well visualized. Right ventricular systolic function is normal.  Left Atrium: Left atrial size was mildly dilated.  Right Atrium: Right atrial size was not well visualized.  Pericardium: There is no evidence of pericardial effusion.  Mitral Valve: The mitral valve is abnormal. There is mild thickening of the mitral valve leaflet(s). There is mild  calcification of the mitral valve leaflet(s). Mild to moderate mitral annular calcification. Trivial mitral valve regurgitation.  Tricuspid Valve: The tricuspid valve is normal in structure. Tricuspid valve regurgitation is mild.  Aortic Valve: The aortic valve is tricuspid. There is mild calcification of the aortic valve. There is mild thickening of the aortic valve. Aortic valve regurgitation is not visualized. Aortic valve sclerosis/calcification is present, without any evidence of aortic stenosis. Aortic valve mean gradient measures 3.5 mmHg. Aortic valve peak gradient measures 5.9 mmHg. Aortic valve area, by VTI measures 3.22 cm.  Pulmonic Valve: The pulmonic valve was normal in structure. Pulmonic valve regurgitation is trivial.  Aorta: Aortic dilatation noted. There is borderline dilatation of the aortic root, measuring 37 mm.  IAS/Shunts: The atrial septum is grossly normal.   LEFT VENTRICLE PLAX 2D LVIDd:         3.70 cm   Diastology LVIDs:         2.30 cm   LV e' medial:    12.00  cm/s LV PW:         1.50 cm   LV E/e' medial:  9.3 LV IVS:        1.60 cm   LV e' lateral:   9.90 cm/s LVOT diam:     2.20 cm   LV E/e' lateral: 11.3 LV SV:         71 LV SV Index:   32 LVOT Area:     3.80 cm   RIGHT VENTRICLE RV S prime:     9.73 cm/s  LEFT ATRIUM           Index LA diam:      3.90 cm 1.76 cm/m LA Vol (A2C): 38.8 ml 17.56 ml/m LA Vol (A4C): 41.1 ml 18.60 ml/m AORTIC VALVE AV Area (Vmax):    3.43 cm AV Area (Vmean):   3.28 cm AV Area (VTI):     3.22 cm AV Vmax:           121.50 cm/s AV Vmean:          83.950 cm/s AV VTI:            0.222 m AV Peak Grad:      5.9 mmHg AV Mean Grad:      3.5 mmHg LVOT Vmax:         109.67 cm/s LVOT Vmean:        72.467 cm/s LVOT VTI:          0.188 m LVOT/AV VTI ratio: 0.85  AORTA Ao Root diam: 3.70 cm Ao Asc diam:  3.50 cm  MITRAL VALVE                TRICUSPID VALVE MV Area (PHT): 3.53 cm     TR Peak grad:   9.5 mmHg MV Decel Time: 215 msec     TR Vmax:        154.00 cm/s MV E velocity: 112.00 cm/s MV A velocity: 116.00 cm/s  SHUNTS MV E/A ratio:  0.97         Systemic VTI:  0.19 m Systemic Diam: 2.20 cm  Laurance Flatten MD Electronically signed by Laurance Flatten MD Signature Date/Time: 08/20/2022/4:00:35 PM    Final          ______________________________________________________________________________________________      EKG:   EKG Interpretation Date/Time:  Monday September 05 2023 14:49:05 EDT Ventricular Rate:  78 PR Interval:  190 QRS Duration:  144 QT Interval:  414 QTC Calculation:  471 R Axis:   105  Text Interpretation: Normal sinus rhythm Right bundle branch block When compared with ECG of 17-Aug-2022 06:33, Nonspecific T wave abnormality no longer evident in Anterior leads Confirmed by Tonny Bollman [phone]) on 09/05/2023 2:59:42 PM    Recent Labs: No results found for requested labs within last 365 days.  Recent Lipid Panel    Component Value Date/Time   CHOL 95  08/21/2022 0959   TRIG 157 (H) 08/21/2022 0959   HDL 39 (L) 08/21/2022 0959   CHOLHDL 2.4 08/21/2022 0959   VLDL 31 08/21/2022 0959   LDLCALC 25 08/21/2022 0959     Risk Assessment/Calculations:                Physical Exam:    VS:  BP 108/64   Pulse 78   Ht 5\' 11"  (1.803 m)   Wt 238 lb 12.8 oz (108.3 kg)   SpO2 94%   BMI 33.31 kg/m     Wt Readings from Last 3 Encounters:  09/05/23 238 lb 12.8 oz (108.3 kg)  04/12/23 237 lb (107.5 kg)  03/21/23 235 lb (106.6 kg)     GEN:  Well nourished, well developed in no acute distress HEENT: Normal NECK: No JVD; No carotid bruits LYMPHATICS: No lymphadenopathy CARDIAC: RRR, no murmurs, rubs, gallops RESPIRATORY:  Clear to auscultation without rales, wheezing or rhonchi  ABDOMEN: Soft, non-tender, non-distended MUSCULOSKELETAL:  No edema; No deformity  SKIN: Warm and dry NEUROLOGIC:  Alert and oriented x 3 PSYCHIATRIC:  Normal affect   Assessment & Plan Coronary artery disease involving native coronary artery of native heart with angina pectoris (HCC) Continue clopidogrel for antiplatelet therapy. Rosuvastatin, praluent, and ezetimibe for lipid-lowering.  Mixed hyperlipidemia Chol 88, HDL 52, LDL 24, Trig 59. Pt on multidrug lipid-lowering Rx as above. LFT's normal with an ALT of 30.  Essential hypertension BP well-controlled on metoprolol tartrate and lisinopril.  Increase physical activity and dietary modification discussed.      Medication Adjustments/Labs and Tests Ordered: Current medicines are reviewed at length with the patient today.  Concerns regarding medicines are outlined above.  Orders Placed This Encounter  Procedures   EKG 12-Lead   No orders of the defined types were placed in this encounter.   Patient Instructions  Follow-Up: At [facility], you and your health needs are our priority.  As part of our continuing mission to provide you with exceptional heart care, we have created designated  Provider Care Teams.  These Care Teams include your primary Cardiologist (physician) and Advanced Practice Providers (APPs -  Physician Assistants and Nurse Practitioners) who all work together to provide you with the care you need, when you need it.  Your next appointment:   6 month(s)  Provider:   Tonny Bollman, MD      1st Floor: - Lobby - Registration  - Pharmacy  - Lab - Cafe  2nd Floor: - PV Lab - Diagnostic Testing (echo, CT, nuclear med)  3rd Floor: - Vacant  4th Floor: - TCTS (cardiothoracic surgery) - AFib Clinic - Structural Heart Clinic - Vascular Surgery  - Vascular Ultrasound  5th Floor: - HeartCare Cardiology (general and EP) - Clinical Pharmacy for coumadin, hypertension, lipid, weight-loss medications, and med management appointments    Valet parking services will be available as well.     Signed, Tonny Bollman, MD  09/05/2023 5:04 PM    [facility]

## 2023-09-05 NOTE — Assessment & Plan Note (Signed)
 Chol 88, HDL 52, LDL 24, Trig 59. Pt on multidrug lipid-lowering Rx as above. LFT's normal with an ALT of 30.

## 2023-09-05 NOTE — Patient Instructions (Signed)
 Follow-Up: At [facility], you and your health needs are our priority.  As part of our continuing mission to provide you with exceptional heart care, we have created designated Provider Care Teams.  These Care Teams include your primary Cardiologist (physician) and Advanced Practice Providers (APPs -  Physician Assistants and Nurse Practitioners) who all work together to provide you with the care you need, when you need it.  Your next appointment:   6 month(s)  Provider:   Tonny Bollman, MD   1st Floor: - Lobby - Registration  - Pharmacy  - Lab - Cafe  2nd Floor: - PV Lab - Diagnostic Testing (echo, CT, nuclear med)  3rd Floor: - Vacant  4th Floor: - TCTS (cardiothoracic surgery) - AFib Clinic - Structural Heart Clinic - Vascular Surgery  - Vascular Ultrasound  5th Floor: - HeartCare Cardiology (general and EP) - Clinical Pharmacy for coumadin, hypertension, lipid, weight-loss medications, and med management appointments    Valet parking services will be available as well.

## 2023-09-05 NOTE — Assessment & Plan Note (Signed)
 Continue clopidogrel for antiplatelet therapy. Rosuvastatin, praluent, and ezetimibe for lipid-lowering.

## 2023-09-08 ENCOUNTER — Telehealth: Payer: Self-pay

## 2023-09-08 DIAGNOSIS — E1065 Type 1 diabetes mellitus with hyperglycemia: Secondary | ICD-10-CM

## 2023-09-08 NOTE — Patient Outreach (Signed)
 Referral placed to [facility] for CCM services.  Bevelyn Ngo, BSW, CDP [address]  [facility] - [facility] Manager Population Health Direct Dial: [phone]  Fax: [phone]

## 2023-09-10 DIAGNOSIS — R0902 Hypoxemia: Secondary | ICD-10-CM | POA: Diagnosis not present

## 2023-09-28 ENCOUNTER — Ambulatory Visit: Payer: Self-pay

## 2023-10-11 DIAGNOSIS — R0902 Hypoxemia: Secondary | ICD-10-CM | POA: Diagnosis not present

## 2023-10-20 DIAGNOSIS — Z4681 Encounter for fitting and adjustment of insulin pump: Secondary | ICD-10-CM | POA: Diagnosis not present

## 2023-10-20 DIAGNOSIS — Z794 Long term (current) use of insulin: Secondary | ICD-10-CM | POA: Diagnosis not present

## 2023-10-20 DIAGNOSIS — I10 Essential (primary) hypertension: Secondary | ICD-10-CM | POA: Diagnosis not present

## 2023-10-20 DIAGNOSIS — I2510 Atherosclerotic heart disease of native coronary artery without angina pectoris: Secondary | ICD-10-CM | POA: Diagnosis not present

## 2023-10-20 DIAGNOSIS — E1039 Type 1 diabetes mellitus with other diabetic ophthalmic complication: Secondary | ICD-10-CM | POA: Diagnosis not present

## 2023-10-24 ENCOUNTER — Telehealth: Payer: Self-pay | Admitting: *Deleted

## 2023-10-24 NOTE — Progress Notes (Signed)
 Complex Care Management Care Guide Note  10/24/2023 Name: ELDRA LENTON MRN: [id number] DOB: 1965-11-17  Baldomero Bone is a 58 y.o. year old male who is a primary care patient of Perini, Lavonia Powers, MD and is actively engaged with the care management team. I reached out to Baldomero Bone by phone today to assist with re-scheduling  with the RN Case Manager.  Follow up plan: Unsuccessful telephone outreach attempt made. A HIPAA compliant phone message was left for the patient providing contact information and requesting a return call.  Barnie Bora  [facility]  [facility], [facility] Guide  Direct Dial: [phone]  Fax [phone]

## 2023-10-24 NOTE — Progress Notes (Signed)
 Complex Care Management Care Guide Note  10/24/2023 Name: [name] MRN: [id number] DOB: 1965-11-21  Jeff [name] is a 58 y.o. year old male who is a primary care patient of Perini, Lavonia Powers, MD and is actively engaged with the care management team. I reached out to Jeff [name] by phone today to assist with re-scheduling  with the RN Case Manager Licensed Clinical Social Worker.  Follow up plan: Telephone appointment with complex care management team member scheduled for:  5/7 with RNCM and 5/23 with LCSW   Barnie Bora  [facility], [facility] Guide  Direct Dial: [phone]  Fax [phone]

## 2023-10-31 DIAGNOSIS — E1039 Type 1 diabetes mellitus with other diabetic ophthalmic complication: Secondary | ICD-10-CM | POA: Diagnosis not present

## 2023-11-02 ENCOUNTER — Other Ambulatory Visit: Payer: Self-pay

## 2023-11-02 NOTE — Patient Outreach (Signed)
 Complex Care Management   Visit Note  11/02/2023  Name:  Jeff [name] MRN: [id number] DOB: 1965/07/09  Situation: Referral received for Complex Care Management related to  DM  I obtained verbal consent from Patient.  Visit completed with patient  on the phone  Background:   Past Medical History:  Diagnosis Date   Anxiety    CAD (coronary artery disease)    Cath 2011.  LAD stent patent, distal LAD occlusion, D1 100%, OM branch 80 - 90%, SVG to diag patent, LIMA to LAD occluded, SVG to RCA occluded, SVG to OM was occluded.   No change from previous cath.  Managed medically.    Cataract    Depression    Diabetes mellitus type I (HCC)    on insulin  pump at  home   Diabetic retinopathy    s/p vitrectomy and history of retinal surgery   Hemorrhoid    History of oxygen  administration    nocutural use only at 2 l/m nasally.   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 Declines need for RN Care Management follow up. No Care Management needs noted at this time. LCSW scheduled for 11/18/22.   Patient Reported Symptoms: Cognitive Cognitive Status: Alert and oriented to person, place, and time, Insightful and able to interpret abstract concepts, Normal speech and language skills      Neurological Neurological Review of Symptoms: No symptoms reported Neurological Conditions: Seizures (neurofibromatosis type 1) Neurological Management Strategies: Medication therapy Neurological Self-Management Outcome: 4 (good)  HEENT HEENT Symptoms Reported: Change or loss of hearing, Other: Other HEENT Symptoms/Conditions: paitent reports  attempted hearing aids, but did not work for him and is not using for now HEENT Self-Management Outcome: 4 (good)    Cardiovascular Cardiovascular Symptoms Reported: No symptoms reported Does patient have uncontrolled Hypertension?: No Cardiovascular Conditions: Hypertension, High blood cholesterol Cardiovascular Management Strategies: Medication therapy, Routine screening, Exercise (walks around in home, in grocery stores) Cardiovascular Self-Management Outcome: 4 (good)  Respiratory Respiratory Symptoms Reported: No symptoms reported    Endocrine Patient reports the following symptoms related to hypoglycemia or hyperglycemia : No symptoms reported Is patient diabetic?: Yes Is patient checking blood sugars at home?: Yes Endocrine Conditions: Diabetes Endocrine Management Strategies: Medication therapy, Routine screening, Exercise, Diet modification Endocrine Self-Management Outcome: 5 (very good) Endocrine Comment: latest office visit with Pasqual Bone, CDE was 10/26/23. Has freestyle libre 3 which patient reports alarms are set. he report his knows his signs/symptoms for low blood sugar and treatment discussed if low blood sugar. patient reports alarms set for high blood sugar and states he is following instructions as recommended by CDE. Patient reports he is more diligent and takes importance of blood sugar management serious. He denies any questions at this time. A1C 6.5 on 10/20/23.  Gastrointestinal Gastrointestinal Symptoms Reported: Other Other Gastrointestinal Symptoms: patient reports "attention grabbing; more like cramping than pain" prior to bowel movement noted the past 5-6 days since he started eating [employer] Yogurt every day. He reports loose, but not diarhea. He reports he will monitor and  may need to cut back on the Chob[employer] yogurt. Patient states he will notify provider if does not improve or worsens. Gastrointestinal Conditions: Other Gastrointestinal Management Strategies: Diet  modification Gastrointestinal Self-Management Outcome: 3 (uncertain) Gastrointestinal Comment: patient reports if it does not get better or continues he will follow up with primary provider. Nutrition Risk Screen (CP): No indicators present  Genitourinary Genitourinary Symptoms Reported: Other Other Genitourinary Symptoms: reports dribbing (chronic for about 2 years). patient reports PCP is aware and states he will reach out to PCP if condition worsens or if he has concerns Genitourinary Conditions: Other Other Genitourinary Conditions: patient notes dribbling at times, possible causes discussed. patient states he will follow up with PCP as needed. Genitourinary Management Strategies: Exercise, Activity Genitourinary Self-Management Outcome: 4 (good)  Integumentary Integumentary Symptoms Reported: No symptoms reported    Musculoskeletal Musculoskelatal Symptoms Reviewed: Other Other Musculoskeletal Symptoms: patient reports some discomfort noted, "the other day when stepping down off the curb". However he reports resolving, "practically gone". Musculoskeletal Management Strategies: Adequate rest Musculoskeletal Self-Management Outcome: 4 (good)      Psychosocial Psychosocial Symptoms Reported: Anxiety - if selected complete GAD, Depression - if selected complete PHQ 2-9     Quality of Family Relationships: supportive      11/02/2023    3:03 PM  Depression screen PHQ 2/9  Decreased Interest 1  Down, Depressed, Hopeless 1  PHQ - 2 Score 2  Altered sleeping 2  Tired, decreased energy 2  Change in appetite 0  Feeling bad or failure about yourself  1  Trouble concentrating 0  Moving slowly or fidgety/restless 0  Suicidal thoughts 0  PHQ-9 Score 7  Difficult doing work/chores Somewhat difficult      11/02/2023    3:07 PM  GAD 7 : Generalized Anxiety Score  Nervous, Anxious, on Edge 1  Control/stop worrying 2  Worry too much - different things 1  Trouble relaxing 0  Restless 0   Easily annoyed or irritable 2  Afraid - awful might happen 1  Total GAD 7 Score 7  Anxiety Difficulty Somewhat difficult   There were no vitals filed for this visit.  Medications Reviewed Today     Reviewed by Duell Holdren M, RN (Registered Nurse) on 11/02/23 at 1427  Med List Status: <None>   Medication Order Taking? Sig Documenting Provider Last Dose Status Informant  ACCU-CHEK GUIDE test strip [id number]   [provider]  Active Spouse/Significant Other  Accu-Chek Softclix Lancets lancets [id number]   [provider]  Active Spouse/Significant Other  acetaminophen  (TYLENOL ) 500 MG tablet [id number] Yes Take 1,000 mg by mouth every 6 (six) hours as needed for mild pain. [provider] Taking Active Spouse/Significant Other  Alirocumab (PRALUENT) 150 MG/ML SOAJ [id number] Yes Inject 150 mg into the skin every 14 (fourteen) days. Every other Tuesday [provider] Taking Active Spouse/Significant Other  Cholecalciferol (VITAMIN D) 2000 units CAPS [id number] Yes Take 2,000 Units by mouth daily.  [provider] Taking Active Spouse/Significant Other  clopidogrel  (PLAVIX ) 75 MG tablet [id number] Yes Take 1 tablet (75 mg total) by mouth daily. Cassandra Cleveland, MD Taking Active   Continuous Blood Gluc Sensor (FREESTYLE LIBRE 3 SENSOR) [place] [id number] Yes 1 Device by Does not apply route as needed (check blood glucose as needed/directed). Check blood glucose as needed/directed [provider] Taking Active   cyanocobalamin  1000 MCG tablet [id number] Yes Take 1,000 mcg by mouth daily. [provider] Taking Active Spouse/Significant Other  desvenlafaxine  (PRISTIQ ) 100 MG  24 hr tablet [id number] Yes Take 100 mg by mouth daily. [provider] Taking Active Spouse/Significant Other           Med Note Tanis Fan, GREG A   Wed May 26, 2016  9:39 PM)    docusate sodium  (COLACE) 100 MG capsule [id number] Yes Take 100 mg by mouth at bedtime.  [provider] Taking Active Spouse/Significant Other  ezetimibe  (ZETIA ) 10 MG tablet [id number] Yes Take 10 mg by mouth at bedtime. [provider] Taking Active Spouse/Significant Other  insulin  aspart (NOVOLOG ) 100 UNIT/ML injection [id number] Yes Inject 100 Units into the skin. Use per pump as needed [provider] Taking Active   isosorbide  mononitrate (IMDUR ) 60 MG 24 hr tablet [id number] Yes Take 60 mg by mouth every morning.  [provider] Taking Active Spouse/Significant Other  levETIRAcetam  (KEPPRA ) 750 MG tablet [id number] Yes TAKE 1 TABLET(750 MG) BY MOUTH TWICE DAILY Camara, Amadou, MD Taking Active   lisinopril  (PRINIVIL ,ZESTRIL ) 5 MG tablet [id number] Yes Take 5 mg by mouth every morning.  [provider] Taking Active Spouse/Significant Other  metoprolol  tartrate (LOPRESSOR ) 25 MG tablet [id number] Yes TAKE 1 TABLET(25 MG) BY MOUTH TWICE DAILY Arnoldo Lapping, MD Taking Active   Multiple Vitamins-Minerals (CENTRUM SILVER  50+MEN) TABS [id number] Yes Take 1 tablet by mouth every morning. [provider] Taking Active Spouse/Significant Other  nitroGLYCERIN  (NITROSTAT ) 0.4 MG SL tablet [id number] Yes Place 1 tablet (0.4 mg total) under the tongue every 5 (five) minutes as needed for chest pain. Arnoldo Lapping, MD Taking Active   OXYGEN  [id number]  Place 2 L into the nose at bedtime. [provider]  Active Spouse/Significant Other  promethazine (PHENERGAN) 25 MG tablet [id number] Yes Take 25 mg by mouth every 6 (six) hours as needed for nausea or vomiting. [provider] Taking Active   rosuvastatin  (CRESTOR ) 40 MG tablet [id number] Yes Take 40 mg by mouth at bedtime.  [provider] Taking Active Spouse/Significant Other  tamsulosin  (FLOMAX ) 0.4 MG CAPS capsule [id number] Yes Take 1 capsule (0.4 mg total) by mouth daily. Rai, Hurman Maiden, MD Taking Active   TEGRETOL -XR 200 MG 12 hr tablet [id number] Yes TAKE 3 TABLETS BY  MOUTH TWICE DAILY Wess Hammed, NP Taking Active           Recommendation:   Patient to continue to reach out to Primary care with any health questions or concerns as needed Patient to continue to follow up with CDE as scheduled  Follow Up Plan:   No further follow up needed with RN Care Manager. Patient to contact RNCM if Care Management needs in the further. Contact number provided. LCSW scheduled to reach out to patient on 11/18/22 regarding mental health needs.  Lindi Revering, RN, MSN, BSN, CCM [place]  [facility], Population Health Case Manager Phone: [phone]

## 2023-11-02 NOTE — Patient Instructions (Addendum)
 Visit Information  Thank you for taking time to visit with me today. Please don't hesitate to contact me if I can be of assistance to you before our next scheduled appointment.  No further follow up  with RN Care Managaer needed.  LCSW is scheduled to call you 11/18/23 at 1:00 pm.  Please call the care guide team at [phone] if you need to cancel or reschedule your appointment.   Please call the Suicide and Crisis Lifeline: 988 call the [place]  National Suicide Prevention Lifeline: 410-516-7606 or TTY: (559)259-6385 TTY (701)817-2605) to talk to a trained counselor if you are experiencing a Mental Health or Behavioral Health Crisis or need someone to talk to.  Patient verbalizes understanding of instructions and care plan provided today and agrees to view in MyChart. Active MyChart status and patient understanding of how to access instructions and care plan via MyChart confirmed with patient.     Lindi Revering, RN, MSN, BSN, CCM [place]  [facility], Population Health Case Manager Phone: [phone]

## 2023-11-10 ENCOUNTER — Other Ambulatory Visit: Payer: Self-pay | Admitting: Neurology

## 2023-11-10 DIAGNOSIS — R0902 Hypoxemia: Secondary | ICD-10-CM | POA: Diagnosis not present

## 2023-11-11 ENCOUNTER — Telehealth: Admitting: Licensed Clinical Social Worker

## 2023-11-18 ENCOUNTER — Other Ambulatory Visit: Payer: Self-pay | Admitting: *Deleted

## 2023-11-21 NOTE — Patient Outreach (Signed)
 Complex Care Management   Visit Note  11/21/2023  Name:  Jeff [name] MRN: [id number] DOB: Jul 22, 1965  Situation: Referral received for Complex Care Management related to Mental/Behavioral Health diagnosis Anxiety I obtained verbal consent from Patient.  Visit completed with patient   on the phone on 11/18/23  Background:   Past Medical History:  Diagnosis Date   Anxiety    CAD (coronary artery disease)    Cath 2011.  LAD stent patent, distal LAD occlusion, D1 100%, OM branch 80 - 90%, SVG to diag patent, LIMA to LAD occluded, SVG to RCA occluded, SVG to OM was occluded.   No change from previous cath.  Managed medically.    Cataract    Depression    Diabetes mellitus type I (HCC)    on insulin  pump at  home   Diabetic retinopathy    s/p vitrectomy and history of retinal surgery   Hemorrhoid    History of oxygen  administration    nocutural use only at 2 l/m nasally.   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   Seasonal allergic rhinitis    Seizure disorder (HCC)    Sleep apnea    s/p oral surgery   Stroke (HCC)    Toe fracture, right    11-23-14 fracture right big toe-wearing a stability shoe    Assessment: Patient Reported Symptoms:  Cognitive Cognitive Status: Alert and oriented to person, place, and time, Insightful and able to interpret abstract concepts, Normal speech and language skills      Neurological Neurological Review of Symptoms: No symptoms reported    HEENT HEENT Symptoms Reported: Change or loss of hearing Other HEENT Symptoms/Conditions: hearing aids attempted but no longer using, did not feel there was much of an improvement HEENT Self-Management Outcome: 4 (good)    Cardiovascular Cardiovascular Symptoms Reported: No symptoms reported    Respiratory Respiratory Symptoms Reported: No symptoms reported    Endocrine Patient reports the following  symptoms related to hypoglycemia or hyperglycemia : No symptoms reported Is patient diabetic?: Yes Is patient checking blood sugars at home?: Yes Endocrine Conditions: Diabetes Endocrine Management Strategies: Medical device, Medication therapy (insulin  pump, freestyle libre) Endocrine Self-Management Outcome: 4 (good)  Gastrointestinal Gastrointestinal Symptoms Reported: No symptoms reported      Genitourinary Genitourinary Symptoms Reported: Frequency Other Genitourinary Symptoms: frequent urination at night-will use the restroom 1-3 times per night-PCP aware Genitourinary Management Strategies: Medication therapy, Diet modification Genitourinary Comment: currently taking  Flomax   Integumentary Integumentary Symptoms Reported: No symptoms reported    Musculoskeletal Musculoskelatal Symptoms Reviewed: No symptoms reported   Falls in the past year?: No Number of falls in past year: 1 or less Was there an injury with Fall?: No Fall Risk Category Calculator: 0 Patient Fall Risk Level: Low Fall Risk    Psychosocial Psychosocial Symptoms Reported: Anxiety - if selected complete GAD, Depression - if selected complete PHQ 2-9 Behavioral Health Conditions: Anxiety, Depression Behavioral Management Strategies: Coping strategies, Medication therapy, Adequate rest Behavioral Health Comment: patient reports that "some issues" have improved, reports no need for therapy at this time-hx of counseling since 2008 Major Change/Loss/Stressor/Fears (CP): Medical condition, self Behaviors When Feeling Stressed/Fearful: sports, comedy, Techniques to Cope with Loss/Stress/Change: Diversional activities, Medication Quality of Family Relationships: supportive Do you feel physically threatened by others?: No      11/18/2023    1:55 PM  Depression screen PHQ 2/9  Decreased Interest 1  Down, Depressed, Hopeless 1  PHQ - 2 Score 2  Altered sleeping 2  Tired, decreased energy 2  Change in appetite 0   Feeling bad or failure about yourself  1  Trouble concentrating 0  Moving slowly or fidgety/restless 0  Suicidal thoughts 0  PHQ-9 Score 7  Difficult doing work/chores Somewhat difficult    There were no vitals filed for this visit.  Medications Reviewed Today     Reviewed by Ave Leisure, LCSW (Social Worker) on 11/18/23 at 1353  Med List Status: <None>   Medication Order Taking? Sig Documenting Provider Last Dose Status Informant  ACCU-CHEK GUIDE test strip [id number] Yes  [provider] Taking Active Spouse/Significant Other  Accu-Chek Softclix Lancets lancets [id number] Yes  [provider] Taking Active Spouse/Significant Other  acetaminophen  (TYLENOL ) 500 MG tablet [id number] Yes Take 1,000 mg by mouth every 6 (six) hours as needed for mild pain. [provider] Taking Active Spouse/Significant Other  Alirocumab (PRALUENT) 150 MG/ML SOAJ [id number] Yes Inject 150 mg into the skin every 14 (fourteen) days. Every other Tuesday [provider] Taking Active Spouse/Significant Other  Cholecalciferol (VITAMIN D) 2000 units CAPS [id number] Yes Take 2,000 Units by mouth daily.  [provider] Taking Active Spouse/Significant Other  clopidogrel  (PLAVIX ) 75 MG tablet [id number] Yes TAKE 1 TABLET(75 MG) BY MOUTH DAILY Camara, Amadou, MD Taking Active   Continuous Blood Gluc Sensor (FREESTYLE LIBRE 3 SENSOR) [place] [id number] Yes 1 Device by Does not apply route as needed (check blood glucose as needed/directed). Check blood glucose as needed/directed [provider] Taking Active   cyanocobalamin  1000 MCG tablet [id number] Yes Take 1,000 mcg by mouth daily. [provider] Taking Active Spouse/Significant Other  desvenlafaxine  (PRISTIQ ) 100 MG 24 hr tablet [id number] Yes Take 100 mg by mouth daily. [provider] Taking Active Spouse/Significant Other           Med Note Tanis Fan, GREG A   Wed May 26, 2016  9:39 PM)     docusate sodium  (COLACE) 100 MG capsule [id number] Yes Take 100 mg by mouth at bedtime. [provider] Taking Active Spouse/Significant Other  ezetimibe  (ZETIA ) 10 MG tablet [id number] Yes Take 10 mg by mouth at bedtime. [provider] Taking Active Spouse/Significant Other  insulin  aspart (NOVOLOG ) 100 UNIT/ML injection [id number] Yes Inject 100 Units into the skin. Use per pump as needed [provider] Taking Active   isosorbide  mononitrate (IMDUR ) 60 MG 24 hr tablet [id number] Yes Take 60 mg by mouth every morning.  [provider] Taking Active Spouse/Significant Other  levETIRAcetam  (KEPPRA ) 750 MG tablet [id number] Yes TAKE 1 TABLET(750 MG) BY MOUTH TWICE DAILY Camara, Amadou, MD Taking Active   lisinopril  (PRINIVIL ,ZESTRIL ) 5 MG tablet [id number] Yes Take 5 mg by mouth every morning.  [provider] Taking Active Spouse/Significant Other  metoprolol  tartrate (LOPRESSOR ) 25 MG tablet [id number] Yes TAKE 1 TABLET(25 MG) BY MOUTH TWICE DAILY Arnoldo Lapping, MD Taking Active   Multiple Vitamins-Minerals (CENTRUM SILVER  50+MEN) TABS [id number] Yes Take 1 tablet by mouth every morning. [provider] Taking Active Spouse/Significant Other  nitroGLYCERIN  (NITROSTAT ) 0.4 MG SL tablet [id number] Yes Place 1 tablet (0.4 mg total) under the tongue every 5 (five) minutes as needed for chest pain. Arnoldo Lapping, MD Taking Active   OXYGEN  [id number] Yes Place 2 L into the nose at bedtime. [provider] Taking Active Spouse/Significant Other  promethazine (PHENERGAN) 25 MG tablet [id number]  Yes Take 25 mg by mouth every 6 (six) hours as needed for nausea or vomiting. [provider] Taking Active   rosuvastatin  (CRESTOR ) 40 MG tablet [id number]  Take 40 mg by mouth at bedtime.  [provider]  Active Spouse/Significant Other  tamsulosin  (FLOMAX ) 0.4 MG CAPS capsule [id number] Yes Take 1 capsule (0.4 mg total) by mouth daily. Rai,  Hurman Maiden, MD Taking Active   TEGRETOL -XR 200 MG 12 hr tablet [id number] Yes TAKE 3 TABLETS BY MOUTH TWICE DAILY Wess Hammed, NP Taking Active             Recommendation:   PCP Follow-up Patient declined further follow up with case management at this time. Patient confirms having no urgent urgent needs at this time as symptoms/issues have begun to improve.   Follow Up Plan:   Patient has met all care management goals. Care Management case will be closed. Patient has been provided contact information should new needs arise.   Everette Dimauro, LCSW [place]  [facility], [facility] Licensed Clinical Social Worker Care Coordinator  Direct Dial: [phone]

## 2023-11-21 NOTE — Patient Instructions (Signed)
 Jeff [name]

## 2023-11-28 DIAGNOSIS — R5383 Other fatigue: Secondary | ICD-10-CM | POA: Diagnosis not present

## 2023-11-28 DIAGNOSIS — R6883 Chills (without fever): Secondary | ICD-10-CM | POA: Diagnosis not present

## 2023-11-28 DIAGNOSIS — J029 Acute pharyngitis, unspecified: Secondary | ICD-10-CM | POA: Diagnosis not present

## 2023-11-28 DIAGNOSIS — R0981 Nasal congestion: Secondary | ICD-10-CM | POA: Diagnosis not present

## 2023-11-28 DIAGNOSIS — E1040 Type 1 diabetes mellitus with diabetic neuropathy, unspecified: Secondary | ICD-10-CM | POA: Diagnosis not present

## 2023-11-28 DIAGNOSIS — Z1152 Encounter for screening for COVID-19: Secondary | ICD-10-CM | POA: Diagnosis not present

## 2023-11-28 DIAGNOSIS — G4733 Obstructive sleep apnea (adult) (pediatric): Secondary | ICD-10-CM | POA: Diagnosis not present

## 2023-12-11 DIAGNOSIS — R0902 Hypoxemia: Secondary | ICD-10-CM | POA: Diagnosis not present

## 2023-12-14 ENCOUNTER — Telehealth: Payer: Self-pay | Admitting: Neurology

## 2023-12-14 NOTE — Telephone Encounter (Signed)
 I spoke with [facility] yesterday and they have Phenytek 100 mg. I have asked them to give him 400 mg twice daily

## 2023-12-14 NOTE — Telephone Encounter (Signed)
 Called and relayed the following information:   Pt voiced understanding and stated that [facility] will actually have the tegretol  xr 200 ready tomorrow.  Routing to dr. Samara Crest   Pt wants to know if should do tegretol  of they phenytek

## 2023-12-14 NOTE — Telephone Encounter (Signed)
 Wrong patient. I am so sorry. Please have him continue with Tegretol  200 mg

## 2023-12-14 NOTE — Telephone Encounter (Signed)
 Called and stated to continue with Tegretol  200 mg. Pt voiced understanding.

## 2023-12-14 NOTE — Telephone Encounter (Signed)
 Pt called stating that there is a Sport and exercise psychologist on the TEGRETOL -XR 200 MG 12 hr tablet and he states that when he was a pt of Dr. Tilda Fogo he was told to not take the generic. Pt would like to know what he is to do because he only has 8 days left of medications.

## 2023-12-15 DIAGNOSIS — G4733 Obstructive sleep apnea (adult) (pediatric): Secondary | ICD-10-CM | POA: Diagnosis not present

## 2023-12-15 DIAGNOSIS — R0981 Nasal congestion: Secondary | ICD-10-CM | POA: Diagnosis not present

## 2023-12-15 DIAGNOSIS — R051 Acute cough: Secondary | ICD-10-CM | POA: Diagnosis not present

## 2023-12-15 DIAGNOSIS — J40 Bronchitis, not specified as acute or chronic: Secondary | ICD-10-CM | POA: Diagnosis not present

## 2023-12-15 DIAGNOSIS — Z1152 Encounter for screening for COVID-19: Secondary | ICD-10-CM | POA: Diagnosis not present

## 2023-12-15 DIAGNOSIS — E1040 Type 1 diabetes mellitus with diabetic neuropathy, unspecified: Secondary | ICD-10-CM | POA: Diagnosis not present

## 2024-01-10 DIAGNOSIS — R0902 Hypoxemia: Secondary | ICD-10-CM | POA: Diagnosis not present

## 2024-01-19 DIAGNOSIS — I209 Angina pectoris, unspecified: Secondary | ICD-10-CM | POA: Diagnosis not present

## 2024-01-19 DIAGNOSIS — H9193 Unspecified hearing loss, bilateral: Secondary | ICD-10-CM | POA: Diagnosis not present

## 2024-01-19 DIAGNOSIS — E1040 Type 1 diabetes mellitus with diabetic neuropathy, unspecified: Secondary | ICD-10-CM | POA: Diagnosis not present

## 2024-01-19 DIAGNOSIS — M8589 Other specified disorders of bone density and structure, multiple sites: Secondary | ICD-10-CM | POA: Diagnosis not present

## 2024-01-19 DIAGNOSIS — F339 Major depressive disorder, recurrent, unspecified: Secondary | ICD-10-CM | POA: Diagnosis not present

## 2024-01-19 DIAGNOSIS — I2510 Atherosclerotic heart disease of native coronary artery without angina pectoris: Secondary | ICD-10-CM | POA: Diagnosis not present

## 2024-01-19 DIAGNOSIS — G4733 Obstructive sleep apnea (adult) (pediatric): Secondary | ICD-10-CM | POA: Diagnosis not present

## 2024-01-19 DIAGNOSIS — G9341 Metabolic encephalopathy: Secondary | ICD-10-CM | POA: Diagnosis not present

## 2024-01-19 DIAGNOSIS — I10 Essential (primary) hypertension: Secondary | ICD-10-CM | POA: Diagnosis not present

## 2024-02-10 DIAGNOSIS — R0902 Hypoxemia: Secondary | ICD-10-CM | POA: Diagnosis not present

## 2024-02-15 DIAGNOSIS — E1039 Type 1 diabetes mellitus with other diabetic ophthalmic complication: Secondary | ICD-10-CM | POA: Diagnosis not present

## 2024-03-07 DIAGNOSIS — E1039 Type 1 diabetes mellitus with other diabetic ophthalmic complication: Secondary | ICD-10-CM | POA: Diagnosis not present

## 2024-03-07 DIAGNOSIS — Z4681 Encounter for fitting and adjustment of insulin pump: Secondary | ICD-10-CM | POA: Diagnosis not present

## 2024-03-07 DIAGNOSIS — I10 Essential (primary) hypertension: Secondary | ICD-10-CM | POA: Diagnosis not present

## 2024-03-07 DIAGNOSIS — I2510 Atherosclerotic heart disease of native coronary artery without angina pectoris: Secondary | ICD-10-CM | POA: Diagnosis not present

## 2024-03-07 DIAGNOSIS — Z794 Long term (current) use of insulin: Secondary | ICD-10-CM | POA: Diagnosis not present

## 2024-03-09 DIAGNOSIS — E1039 Type 1 diabetes mellitus with other diabetic ophthalmic complication: Secondary | ICD-10-CM | POA: Diagnosis not present

## 2024-03-09 DIAGNOSIS — R350 Frequency of micturition: Secondary | ICD-10-CM | POA: Diagnosis not present

## 2024-03-09 DIAGNOSIS — I10 Essential (primary) hypertension: Secondary | ICD-10-CM | POA: Diagnosis not present

## 2024-03-12 DIAGNOSIS — R0902 Hypoxemia: Secondary | ICD-10-CM | POA: Diagnosis not present

## 2024-03-30 DIAGNOSIS — Z23 Encounter for immunization: Secondary | ICD-10-CM | POA: Diagnosis not present

## 2024-04-11 DIAGNOSIS — R0902 Hypoxemia: Secondary | ICD-10-CM | POA: Diagnosis not present

## 2024-04-11 NOTE — Progress Notes (Unsigned)
 [facility]  PATIENT: Jeff [name] DOB: Jul 24, 1965  REQUESTING CLINICIAN: Shayne Anes, MD HISTORY FROM: Patient REASON FOR VISIT: Multiple strokes in the setting of DKA  HISTORICAL  CHIEF COMPLAINT:  Chief Complaint  Patient presents with   Follow-up    Pt in room 14. Alone. Here for Seizure follow up.   Update April 12, 2024 SS: Here today alone. No seizures. Remains on Keppra  750 mg BID, brand name Tegretol  200 mg XR 3 tablets BID. Labs from PCP in July 2025 A1C 6.5. Remains on Plavix  75 mg daily. Lives with his wife. Last seizure around 2019-2020. We talked for awhile about him being on brand name Tegretol , hard time keeping brand name stocked at his pharmacy. Is not driving anymore due to his retinopathy. Mentions spot on face neurofibroma that is bothersome with shaving.   INTERVAL HISTORY 04/12/2023 Patient presents today for follow-up, last visit was in April and since then he has been doing well.  No seizure or seizure activity.  In term of the stroke, again he does not have any deficits from it.  He tells me that he is doing well in terms of diabetes, her and with A1c is less than 6.5.  Unfortunately due to his diabetes retinopathy and his poor vision, he is no longer driving. He reports compliance with all of his medications.   HISTORY OF PRESENT ILLNESS:  This is a 58 year old gentleman with past medical history of diabetes type 1 on insulin , CAD, hypertension, hyperlipidemia, neurofibromatosis type I and seizure disorder who is presenting after being admitted to the hospital for DKA and found to have multiple strokes.  Patient presented to the hospital on February 19 due to altered mental status.  At that time he was found to be in severe DKA.  DKA was due to his insulin  pump malfunction.  He was treated appropriately, but he was still found to have altered mental status, MRI brain was obtained and patient was found to have multiple punctate bilateral  acute and subacute strokes.  Stroke etiology was secondary to dehydration due to DKA.  He was recommended DAPT for 21 days and continue with Plavix  indefinitely.  He completed DAPT and currently on Plavix .  He denies any deficit, feel like he is back to his baseline.  Hospital course and summary  Patient is a 58 year old male with type I DM, on insulin  pump at home, CAD with previous CABG and PCI, HTN, HLP, neurofibromatosis type I and seizure disorder who presented to the emergency room on 2/19 with altered mental status.  He was not feeling well for the last 24 hours.  He was found down in the bathroom.  In ED, found to be confused, slow to respond to questions, CBGs > 600 Insulin  pump was removed on arrival and cannula was found to be kinked.  Due to significant findings he was admitted to intensive care unit and treated for DKA with insulin  drip, IV fluids.  Troponins more than 3000. WBC was 26,000, lactic acid was more than 9. 2/22, patient continues to improve however with intermittent confusion and difficulties with balance, MRI showed multiple areas of punctate infarction. Neurology consulted Acute metabolic encephalopathy Acute arterial ischemic stroke, multifocal, mult vascular territories [facility]) -CT head on 2/19 without acute findings. -MRI brain showed multiple punctate foci of abnormal diffusion restriction scattered within both cerebral hemispheres, consistent with acute to subacute infarct, no hemorrhage or mass effect.  Unchanged left scalp neurofibroma.   -CTA head and neck showed  no emergent large vessel occlusion or hemodynamically significant stenosis of head and neck -2D echo showed EF 60 to 65%, G1 DD, borderline dilatation of aortic root measuring 37 mm -EEG normal, no seizures or epileptiform discharges - PT evaluation recommended home health PT OT Lipid panel showed triglycerides 157, LDL 25, HDL 39 - Neurology recommend DAPT, discussed with Dr. Rosemarie, recommended aspirin  81  mg daily and Plavix  for 21 days, then continue Plavix  indefinitely -Continue statin  OTHER MEDICAL CONDITIONS: NF1, DM I, CAD, HTN, HLD, Seizures    REVIEW OF SYSTEMS: Full 14 system review of systems performed and negative with exception of: As noted in the HPI   ALLERGIES: Allergies  Allergen Reactions   Doxepin Hcl     Other reaction(s): did not work and had some extra nocturia   Venlafaxine     Other reaction(s): Too much sedation   Latex Rash    HOME MEDICATIONS: Outpatient Medications Prior to Visit  Medication Sig Dispense Refill   ACCU-CHEK GUIDE test strip      Accu-Chek Softclix Lancets lancets      acetaminophen  (TYLENOL ) 500 MG tablet Take 1,000 mg by mouth every 6 (six) hours as needed for mild pain.     Alirocumab (PRALUENT) 150 MG/ML SOAJ Inject 150 mg into the skin every 14 (fourteen) days. Every other Tuesday     Cholecalciferol (VITAMIN D) 2000 units CAPS Take 2,000 Units by mouth daily.      clopidogrel  (PLAVIX ) 75 MG tablet TAKE 1 TABLET(75 MG) BY MOUTH DAILY 90 tablet 3   Continuous Blood Gluc Sensor (FREESTYLE LIBRE 3 SENSOR) MISC 1 Device by Does not apply route as needed (check blood glucose as needed/directed). Check blood glucose as needed/directed     cyanocobalamin  1000 MCG tablet Take 1,000 mcg by mouth daily.     desvenlafaxine  (PRISTIQ ) 100 MG 24 hr tablet Take 100 mg by mouth daily.     docusate sodium  (COLACE) 100 MG capsule Take 100 mg by mouth at bedtime.     ezetimibe  (ZETIA ) 10 MG tablet Take 10 mg by mouth at bedtime.     insulin  aspart (NOVOLOG ) 100 UNIT/ML injection Inject 100 Units into the skin. Use per pump as needed     isosorbide  mononitrate (IMDUR ) 60 MG 24 hr tablet Take 60 mg by mouth every morning.      levETIRAcetam  (KEPPRA ) 750 MG tablet TAKE 1 TABLET(750 MG) BY MOUTH TWICE DAILY 180 tablet 3   lisinopril  (PRINIVIL ,ZESTRIL ) 5 MG tablet Take 5 mg by mouth every morning.      metoprolol  tartrate (LOPRESSOR ) 25 MG tablet TAKE 1  TABLET(25 MG) BY MOUTH TWICE DAILY 180 tablet 3   Multiple Vitamins-Minerals (CENTRUM SILVER  50+MEN) TABS Take 1 tablet by mouth every morning.     nitroGLYCERIN  (NITROSTAT ) 0.4 MG SL tablet Place 1 tablet (0.4 mg total) under the tongue every 5 (five) minutes as needed for chest pain. 25 tablet 5   OXYGEN  Place 2 L into the nose at bedtime.     promethazine (PHENERGAN) 25 MG tablet Take 25 mg by mouth every 6 (six) hours as needed for nausea or vomiting.     rosuvastatin  (CRESTOR ) 40 MG tablet Take 40 mg by mouth at bedtime.      tamsulosin  (FLOMAX ) 0.4 MG CAPS capsule Take 1 capsule (0.4 mg total) by mouth daily. 30 capsule 3   TEGRETOL -XR 200 MG 12 hr tablet TAKE 3 TABLETS BY MOUTH TWICE DAILY 540 tablet 3   No facility-administered  medications prior to visit.    PAST MEDICAL HISTORY: Past Medical History:  Diagnosis Date   Anxiety    CAD (coronary artery disease)    Cath 2011.  LAD stent patent, distal LAD occlusion, D1 100%, OM branch 80 - 90%, SVG to diag patent, LIMA to LAD occluded, SVG to RCA occluded, SVG to OM was occluded.   No change from previous cath.  Managed medically.    Cataract    Depression    Diabetes mellitus type I (HCC)    on insulin  pump at  home   Diabetic retinopathy    s/p vitrectomy and history of retinal surgery   Hemorrhoid    History of oxygen  administration    nocutural use only at 2 l/m nasally.   HTN (hypertension)    Hyperlipidemia    Myocardial infarction [facility])    Neurofibromatosis    with neurofibroma lesion at the base of the skull   Seasonal allergic rhinitis    Seizure disorder [facility])    Sleep apnea    s/p oral surgery   Stroke [facility])    Toe fracture, right    11-23-14 fracture right big toe-wearing a stability shoe    PAST SURGICAL HISTORY: Past Surgical History:  Procedure Laterality Date   APPENDECTOMY     CARDIAC CATHETERIZATION     CATARACT EXTRACTION, BILATERAL     COLONOSCOPY WITH PROPOFOL  N/A 07/21/2017   Procedure:  COLONOSCOPY WITH PROPOFOL ;  Surgeon: Teressa Toribio SQUIBB, MD;  Location: [facility];  Service: Endoscopy;  Laterality: N/A;   CORONARY ANGIOPLASTY     9'11,  CABPG grafts had failed.   CORONARY ARTERY BYPASS GRAFT     4 vessel 4/11   ESOPHAGOGASTRODUODENOSCOPY (EGD) WITH PROPOFOL  N/A 12/05/2014   Procedure: ESOPHAGOGASTRODUODENOSCOPY (EGD) WITH PROPOFOL ;  Surgeon: Toribio SQUIBB Teressa, MD;  Location: [facility];  Service: Endoscopy;  Laterality: N/A;   LAPAROSCOPIC APPENDECTOMY  11/24/2011   LEFT HEART CATHETERIZATION WITH CORONARY/GRAFT ANGIOGRAM N/A 02/22/2013   Procedure: LEFT HEART CATHETERIZATION WITH EL BILE;  Surgeon: Ozell Fell, MD;  Location: [facility] CATH LAB;  Service: Cardiovascular;  Laterality: N/A;   release of right transverse carpal ligament     right eye vitrectomy and detached retina repair  10/2007   TONSILLECTOMY     uvulopalatoplasty surgery      FAMILY HISTORY: Family History  Problem Relation Age of Onset   Crohn's disease Mother    Hearing loss Mother    Coronary artery disease Father 14   Diabetes Father        type II   Hypertension Father    Arthritis Sister    Breast cancer Sister    Squamous cell carcinoma Brother     SOCIAL HISTORY: Social History   Socioeconomic History   Marital status: Married    Spouse name: Dayla   Number of children: 0   Years of education: college   Highest education level: Not on file  Occupational History   Occupation: Unemployed    Comment: previously worked in Airline pilot  Tobacco Use   Smoking status: Never   Smokeless tobacco: Never  Vaping Use   Vaping status: Never Used  Substance and Sexual Activity   Alcohol  use: No   Drug use: No   Sexual activity: Yes  Other Topics Concern   Not on file  Social History Narrative   Patient lives with his wifeETTER Norris).   Patient does not have any children.   Patient is on disability.  Patient is left-handed (writes), but right hand with sports.   Patient  has a college education.   Patient drinks 5-6 cups of caffeine daily.   Social Drivers of Corporate investment banker Strain: Not on file  Food Insecurity: No Food Insecurity (11/18/2023)   Hunger Vital Sign    Worried About Running Out of Food in the Last Year: Never true    Ran Out of Food in the Last Year: Never true  Transportation Needs: No Transportation Needs (11/18/2023)   PRAPARE - Administrator, Civil Service (Medical): No    Lack of Transportation (Non-Medical): No  Physical Activity: Not on file  Stress: Not on file  Social Connections: Not on file  Intimate Partner Violence: Not At Risk (11/18/2023)   Humiliation, Afraid, Rape, and Kick questionnaire    Fear of Current or Ex-Partner: No    Emotionally Abused: No    Physically Abused: No    Sexually Abused: No     PHYSICAL EXAM  GENERAL EXAM/CONSTITUTIONAL: Vitals:  Vitals:   04/12/24 1358  Weight: 236 lb 8 oz (107.3 kg)  Height: 5' 11 (1.803 m)    Body mass index is 32.99 kg/m. Wt Readings from Last 3 Encounters:  04/12/24 236 lb 8 oz (107.3 kg)  09/05/23 238 lb 12.8 oz (108.3 kg)  04/12/23 237 lb (107.5 kg)   Physical Exam  General: The patient is alert and cooperative at the time of the examination.  Skin: Diffuse cutaneous neurofibromas are noted  Neurologic Exam  Mental status: The patient is alert and oriented x 3 at the time of the examination. The patient has apparent normal recent and remote memory, with an apparently normal attention span and concentration ability.  Cranial nerves: Facial symmetry is present. Speech is normal, no aphasia or dysarthria is noted. Extraocular movements are full. Poor visual field, right eye is droopy compared to left   Motor: The patient has good strength in all 4 extremities.  Sensory examination: Soft touch sensation is symmetric on the face, arms, and legs.  Coordination: The patient has good finger-nose-finger bilaterally  Gait and station:  The patient has a normal gait.  Reflexes: Deep tendon reflexes are symmetric.   DIAGNOSTIC DATA (LABS, IMAGING, TESTING) - I reviewed patient records, labs, notes, testing and imaging myself where available.  Lab Results  Component Value Date   WBC 11.7 (H) 08/18/2022   HGB 12.9 (L) 08/18/2022   HCT 37.3 (L) 08/18/2022   MCV 89.9 08/18/2022   PLT 160 08/18/2022      Component Value Date/Time   NA 139 08/22/2022 0116   NA 141 04/22/2021 1602   K 3.6 08/22/2022 0116   CL 104 08/22/2022 0116   CO2 23 08/22/2022 0116   GLUCOSE 70 08/22/2022 0116   BUN 6 08/22/2022 0116   BUN 9 04/22/2021 1602   CREATININE 0.66 08/22/2022 0116   CALCIUM  8.5 (L) 08/22/2022 0116   PROT 5.7 (L) 08/16/2022 0035   PROT 6.1 04/22/2021 1602   ALBUMIN 3.5 08/16/2022 0035   ALBUMIN 4.3 04/22/2021 1602   AST 50 (H) 08/16/2022 0035   ALT 35 08/16/2022 0035   ALKPHOS 89 08/16/2022 0035   BILITOT 0.9 08/16/2022 0035   BILITOT 0.3 04/22/2021 1602   GFRNONAA >60 08/22/2022 0116   GFRAA >60 03/09/2018 1707   Lab Results  Component Value Date   CHOL 95 08/21/2022   HDL 39 (L) 08/21/2022   LDLCALC 25 08/21/2022   TRIG 157 (  H) 08/21/2022   CHOLHDL 2.4 08/21/2022   Lab Results  Component Value Date   HGBA1C 7.4 (H) 08/16/2022   No results found for: [id number] Lab Results  Component Value Date   TSH 3.749 08/16/2022   MRI Brain 08/19/2022 1. Multiple punctate foci of abnormal diffusion restriction scattered within both cerebral hemispheres, consistent with acute to subacute infarcts. No hemorrhage or mass effect. 2. Findings of chronic microvascular ischemia and volume loss. 3. Unchanged left scalp lesion, likely neurofibroma   EEG 08/20/2022 This study is within normal limits. No seizures or epileptiform discharges were seen throughout the recording.    ASSESSMENT AND PLAN  58 y.o. year old male with history of diabetes type 1 on insulin , CAD, hypertension, hyperlipidemia,  neurofibromatosis type I and seizure disorder who is presenting for follow up.  Stroke in 2014 due to DKA, without deficit. No recent seizures reported.   - Continue generic Keppra  750 mg twice a day, continue brand-name Tegretol  XR 200 mg, 3 tablets twice daily - Check routine labs today - We had a long conversation about switching to generic Tegretol , we could check levels for comparison.  For now he wishes to stay on brand-name - I gave him information about a clinic at [facility] that specializes in neurofibromatosis, if he would like a referral he will let me know. Has a neurofibroma on his face that is irritated, suggested he see dermatology, also can refer to clinic. [facility] Neurofibromatosis [facility] [place], [place], [place] [address] -Call for seizures, follow-up in 1 year  Meds ordered this encounter  Medications   levETIRAcetam  (KEPPRA ) 750 MG tablet    Sig: TAKE 1 TABLET(750 MG) BY MOUTH TWICE DAILY    Dispense:  180 tablet    Refill:  3   TEGRETOL -XR 200 MG 12 hr tablet    Sig: TAKE 3 TABLETS BY MOUTH TWICE DAILY    Dispense:  540 tablet    Refill:  3    ZERO refills remain on this prescription. Your patient is requesting advance approval of refills for this medication to PREVENT ANY MISSED DOSES    Lauraine Gayland MANDES, DNP  [facility] [address], [address] [place], [place] [address] [phone]

## 2024-04-12 ENCOUNTER — Encounter: Payer: Self-pay | Admitting: Neurology

## 2024-04-12 ENCOUNTER — Ambulatory Visit: Payer: Medicare PPO | Admitting: Neurology

## 2024-04-12 VITALS — BP 145/71 | Ht 71.0 in | Wt 236.5 lb

## 2024-04-12 DIAGNOSIS — G40209 Localization-related (focal) (partial) symptomatic epilepsy and epileptic syndromes with complex partial seizures, not intractable, without status epilepticus: Secondary | ICD-10-CM

## 2024-04-12 DIAGNOSIS — Q8501 Neurofibromatosis, type 1: Secondary | ICD-10-CM

## 2024-04-12 MED ORDER — TEGRETOL-XR 200 MG PO TB12: ORAL_TABLET | ORAL | 3 refills | Status: AC

## 2024-04-12 MED ORDER — LEVETIRACETAM 750 MG PO TABS: ORAL_TABLET | ORAL | 3 refills | Status: AC

## 2024-04-12 NOTE — Patient Instructions (Signed)
 Continue current seizure medications.  Check labs today.  Please review information I provided you about specialist at [facility].  I will place a referral if you would like.  [facility] Neurofibromatosis [facility] [place], [place] [address]

## 2024-04-14 LAB — CBC WITH DIFFERENTIAL/PLATELET
Basophils Absolute: 0 x10E3/uL (ref 0.0–0.2)
Basos: 1 %
EOS (ABSOLUTE): 0.1 x10E3/uL (ref 0.0–0.4)
Eos: 2 %
Hematocrit: 43.7 % (ref 37.5–51.0)
Hemoglobin: 14.5 g/dL (ref 13.0–17.7)
Immature Grans (Abs): 0 x10E3/uL (ref 0.0–0.1)
Immature Granulocytes: 0 %
Lymphocytes Absolute: 0.9 x10E3/uL (ref 0.7–3.1)
Lymphs: 18 %
MCH: 31.3 pg (ref 26.6–33.0)
MCHC: 33.2 g/dL (ref 31.5–35.7)
MCV: 94 fL (ref 79–97)
Monocytes Absolute: 0.6 x10E3/uL (ref 0.1–0.9)
Monocytes: 13 %
Neutrophils Absolute: 3.4 x10E3/uL (ref 1.4–7.0)
Neutrophils: 66 %
Platelets: 196 x10E3/uL (ref 150–450)
RBC: 4.64 x10E6/uL (ref 4.14–5.80)
RDW: 12.4 % (ref 11.6–15.4)
WBC: 5.1 x10E3/uL (ref 3.4–10.8)

## 2024-04-14 LAB — COMPREHENSIVE METABOLIC PANEL WITH GFR
ALT: 28 IU/L (ref 0–44)
AST: 24 IU/L (ref 0–40)
Albumin: 4.3 g/dL (ref 3.8–4.9)
Alkaline Phosphatase: 90 IU/L (ref 47–123)
BUN/Creatinine Ratio: 10 (ref 10–24)
BUN: 8 mg/dL (ref 8–27)
Bilirubin Total: 0.3 mg/dL (ref 0.0–1.2)
CO2: 24 mmol/L (ref 20–29)
Calcium: 8.6 mg/dL (ref 8.6–10.2)
Chloride: 101 mmol/L (ref 96–106)
Creatinine, Ser: 0.82 mg/dL (ref 0.76–1.27)
Globulin, Total: 1.5 g/dL (ref 1.5–4.5)
Glucose: 168 mg/dL — ABNORMAL HIGH (ref 70–99)
Potassium: 4.1 mmol/L (ref 3.5–5.2)
Sodium: 138 mmol/L (ref 134–144)
Total Protein: 5.8 g/dL — ABNORMAL LOW (ref 6.0–8.5)
eGFR: 101 mL/min/1.73 (ref >59)

## 2024-04-14 LAB — CARBAMAZEPINE LEVEL, TOTAL: Carbamazepine (Tegretol), S: 8.6 ug/mL (ref 4.0–12.0)

## 2024-04-14 LAB — LEVETIRACETAM LEVEL: Levetiracetam Lvl: 17 ug/mL (ref 10.0–40.0)

## 2024-04-17 ENCOUNTER — Ambulatory Visit: Payer: Self-pay | Admitting: Neurology

## 2024-04-17 NOTE — Telephone Encounter (Signed)
 Pt called stating he can not respond to My chart Message is it possible for PT  to get call about Results

## 2024-05-14 ENCOUNTER — Telehealth: Payer: Self-pay

## 2024-05-14 NOTE — Telephone Encounter (Signed)
 The patient was approved for a [employer] grant that will help cover the cost of Praluent Total amount awarded, $2,500.  Effective: 05/01/2024 - 04/30/2025   APW:[id number] ERW:[id number] Hmnle:[id number] PI:[id number]   Pharmacy provided with approval and processing information. Patient informed via telephone  Inocente Butcher, BS, CPhT  Pharmacy Patient Advocate Specialist  Direct Number: [phone] Fax: [phone]

## 2024-05-14 NOTE — Telephone Encounter (Signed)
 Faxed provider portion of patient assistance application for Novolog  through [employer], pt. Gave verbal consent to apply for renewal electronically

## 2024-05-18 ENCOUNTER — Other Ambulatory Visit: Payer: Self-pay

## 2024-05-18 DIAGNOSIS — I10 Essential (primary) hypertension: Secondary | ICD-10-CM

## 2024-05-18 MED ORDER — NITROGLYCERIN 0.4 MG SL SUBL: 0.4000 mg | SUBLINGUAL_TABLET | SUBLINGUAL | 5 refills | Status: AC | PRN

## 2024-05-21 ENCOUNTER — Telehealth: Payer: Self-pay | Admitting: Cardiovascular Disease

## 2024-05-21 DIAGNOSIS — E785 Hyperlipidemia, unspecified: Secondary | ICD-10-CM

## 2024-05-21 DIAGNOSIS — I10 Essential (primary) hypertension: Secondary | ICD-10-CM

## 2024-05-21 DIAGNOSIS — I25719 Atherosclerosis of autologous vein coronary artery bypass graft(s) with unspecified angina pectoris: Secondary | ICD-10-CM

## 2024-05-21 NOTE — Telephone Encounter (Signed)
*  STAT* If patient is at the pharmacy, call can be transferred to refill team.   1. Which medications need to be refilled? (please list name of each medication and dose if known) metoprolol tartrate (LOPRESSOR) 25 MG tablet  2. Which pharmacy/location (including street and city if local pharmacy) is medication to be sent to?  [id number] - [place], [place] - [address]  3. Do they need a 30 day or 90 day supply? 90  

## 2024-05-22 MED ORDER — METOPROLOL TARTRATE 25 MG PO TABS: ORAL_TABLET | ORAL | 3 refills | Status: AC

## 2024-05-22 NOTE — Telephone Encounter (Signed)
 Pt scheduled to see Dr. Wonda 06/07/24.  Refill sent.

## 2024-06-04 DIAGNOSIS — E1039 Type 1 diabetes mellitus with other diabetic ophthalmic complication: Secondary | ICD-10-CM | POA: Diagnosis not present

## 2024-06-05 ENCOUNTER — Other Ambulatory Visit: Payer: Self-pay | Admitting: Neurology

## 2024-06-05 LAB — LAB REPORT - SCANNED: EGFR: 98.6

## 2024-06-06 NOTE — Telephone Encounter (Signed)
 Last seen on 04/12/24 Follow up scheduled 04/18/25    Dispensed Days Supply Quantity Provider Pharmacy  levetiracetam  750 mg tablet 06/04/2024 90 180 tablet Gayland Lauraine PARAS, NP [facility]...     Rx just filled, Rx denied.

## 2024-06-07 ENCOUNTER — Encounter: Payer: Self-pay | Admitting: Cardiovascular Disease

## 2024-06-07 ENCOUNTER — Ambulatory Visit: Attending: Cardiovascular Disease | Admitting: Cardiovascular Disease

## 2024-06-07 VITALS — BP 110/62 | HR 74 | Ht 71.0 in | Wt 238.0 lb

## 2024-06-07 DIAGNOSIS — I10 Essential (primary) hypertension: Secondary | ICD-10-CM | POA: Diagnosis not present

## 2024-06-07 DIAGNOSIS — I25119 Atherosclerotic heart disease of native coronary artery with unspecified angina pectoris: Secondary | ICD-10-CM | POA: Diagnosis not present

## 2024-06-07 DIAGNOSIS — E782 Mixed hyperlipidemia: Secondary | ICD-10-CM

## 2024-06-07 NOTE — Patient Instructions (Signed)
 Medication Instructions:  Your physician recommends that you continue on your current medications as directed. Please refer to the Current Medication list given to you today.  *If you need a refill on your cardiac medications before your next appointment, please call your pharmacy*  Lab Work: None.  If you have labs (blood work) drawn today and your tests are completely normal, you will receive your results only by: MyChart Message (if you have MyChart) OR A paper copy in the mail If you have any lab test that is abnormal or we need to change your treatment, we will call you to review the results.  Testing/Procedures: None.  Follow-Up: At [facility], you and your health needs are our priority.  As part of our continuing mission to provide you with exceptional heart care, our providers are all part of one team.  This team includes your primary Cardiologist (physician) and Advanced Practice Providers or APPs (Physician Assistants and Nurse Practitioners) who all work together to provide you with the care you need, when you need it.  Your next appointment:   6 month(s)  Provider:   Ozell Fell, MD

## 2024-06-07 NOTE — Assessment & Plan Note (Addendum)
 On clopidogrel  for antiplatelet Rx, and stable aggressive lipid lowering therapy.  Anginal symptoms well-managed on isosorbide  and metoprolol .  Continue current management.

## 2024-06-07 NOTE — Assessment & Plan Note (Addendum)
 On combination therapy with rosuvastatin , praluent, and ezetimibe .  Last lipids on file with a cholesterol of 88, HDL 52, LDL 24.  Patient had labs drawn last week for his upcoming wellness exam with Dr. Shayne.

## 2024-06-07 NOTE — Progress Notes (Signed)
 Cardiology Office Note:    Date:  06/07/2024   ID:  Jeff [name], DOB 06/26/1966, MRN [id number]  PCP:  Shayne Anes, MD   [facility] Providers Cardiologist:  Ozell Fell, MD     Referring MD: Shayne Anes, MD   Chief Complaint  Patient presents with   Coronary Artery Disease    History of Present Illness:    Jeff [name] is a 58 y.o. male with a hx of coronary artery disease. The patient underwent CABG in 2011 with surgery complicated by early bypass graft failure. He underwent multivessel stenting thereafter with most recent heart catheterization in 2014 showing patency of his coronary stents and diffuse small vessel disease with recommendations for medical therapy.   He's had some increased stress with his wife [name]'s medical problems. He did have one episode of angina that he rated at 7/10 and he doesn't recall the circumstances. He reports that it was self-limited and did not require sublingual NTG.  The patient has otherwise done well and has not noticed any trend of increasing episodes of chest discomfort.  No shortness of breath, orthopnea, PND, leg swelling, or heart palpitations.  Current Medications: Active Medications[1]   Allergies:   Doxepin hcl, Venlafaxine, and Latex   ROS:   Please see the history of present illness.    All other systems reviewed and are negative.  EKGs/Labs/Other Studies Reviewed:    The following studies were reviewed today: Cardiac Studies & Procedures   ______________________________________________________________________________________________   STRESS TESTS  MYOCARDIAL PERFUSION IMAGING 04/11/2018  Interpretation Summary  Nuclear stress EF: 70%.  The left ventricular ejection fraction is hyperdynamic (>65%).  There was no ST segment deviation noted during stress. Transient second degree and third degree AV block were noted during infusion.  The study is normal.  This is a low risk study.    ECHOCARDIOGRAM  ECHOCARDIOGRAM COMPLETE 08/20/2022  Narrative ECHOCARDIOGRAM REPORT    Patient Name:   Jeff [name] Date of Exam: 08/20/2022 Medical Rec #:  [id number]       Height:       71.0 in Accession #:    [id number]      Weight:       223.3 lb Date of Birth:  Sep 10, 1965        BSA:          2.210 m Patient Age:    56 years        BP:           132/69 mmHg Patient Gender: M               HR:           71 bpm. Exam Location:  Inpatient  Procedure: 2D Echo  Indications:    Stroke  History:        Patient has prior history of Echocardiogram examinations. CAD, Prior CABG; Risk Factors:Hypertension, Diabetes and Dyslipidemia.  Sonographer:    Thea Norlander Referring Phys: [id number] KUBER GHIMIRE  IMPRESSIONS   1. Left ventricular ejection fraction, by estimation, is 60 to 65%. The left ventricle has normal function. The left ventricle has no regional wall motion abnormalities. There is mild concentric left ventricular hypertrophy. Left ventricular diastolic parameters are consistent with Grade I diastolic dysfunction (impaired relaxation). 2. Right ventricular systolic function is normal. The right ventricular size is not well visualized. 3. Left atrial size was mildly dilated. 4. The mitral valve is abnormal. Trivial mitral valve regurgitation. 5. The aortic valve  is tricuspid. There is mild calcification of the aortic valve. There is mild thickening of the aortic valve. Aortic valve regurgitation is not visualized. Aortic valve sclerosis/calcification is present, without any evidence of aortic stenosis. 6. Aortic dilatation noted. There is borderline dilatation of the aortic root, measuring 37 mm.  Conclusion(s)/Recommendation(s): No intracardiac source of embolism detected on this transthoracic study. Consider a transesophageal echocardiogram to exclude cardiac source of embolism if clinically indicated.  FINDINGS Left Ventricle: Left ventricular ejection fraction, by  estimation, is 60 to 65%. The left ventricle has normal function. The left ventricle has no regional wall motion abnormalities. The left ventricular internal cavity size was normal in size. There is mild concentric left ventricular hypertrophy. Left ventricular diastolic parameters are consistent with Grade I diastolic dysfunction (impaired relaxation).  Right Ventricle: The right ventricular size is not well visualized. Right vetricular wall thickness was not well visualized. Right ventricular systolic function is normal.  Left Atrium: Left atrial size was mildly dilated.  Right Atrium: Right atrial size was not well visualized.  Pericardium: There is no evidence of pericardial effusion.  Mitral Valve: The mitral valve is abnormal. There is mild thickening of the mitral valve leaflet(s). There is mild calcification of the mitral valve leaflet(s). Mild to moderate mitral annular calcification. Trivial mitral valve regurgitation.  Tricuspid Valve: The tricuspid valve is normal in structure. Tricuspid valve regurgitation is mild.  Aortic Valve: The aortic valve is tricuspid. There is mild calcification of the aortic valve. There is mild thickening of the aortic valve. Aortic valve regurgitation is not visualized. Aortic valve sclerosis/calcification is present, without any evidence of aortic stenosis. Aortic valve mean gradient measures 3.5 mmHg. Aortic valve peak gradient measures 5.9 mmHg. Aortic valve area, by VTI measures 3.22 cm.  Pulmonic Valve: The pulmonic valve was normal in structure. Pulmonic valve regurgitation is trivial.  Aorta: Aortic dilatation noted. There is borderline dilatation of the aortic root, measuring 37 mm.  IAS/Shunts: The atrial septum is grossly normal.   LEFT VENTRICLE PLAX 2D LVIDd:         3.70 cm   Diastology LVIDs:         2.30 cm   LV e' medial:    12.00 cm/s LV PW:         1.50 cm   LV E/e' medial:  9.3 LV IVS:        1.60 cm   LV e' lateral:   9.90  cm/s LVOT diam:     2.20 cm   LV E/e' lateral: 11.3 LV SV:         71 LV SV Index:   32 LVOT Area:     3.80 cm   RIGHT VENTRICLE RV S prime:     9.73 cm/s  LEFT ATRIUM           Index LA diam:      3.90 cm 1.76 cm/m LA Vol (A2C): 38.8 ml 17.56 ml/m LA Vol (A4C): 41.1 ml 18.60 ml/m AORTIC VALVE AV Area (Vmax):    3.43 cm AV Area (Vmean):   3.28 cm AV Area (VTI):     3.22 cm AV Vmax:           121.50 cm/s AV Vmean:          83.950 cm/s AV VTI:            0.222 m AV Peak Grad:      5.9 mmHg AV Mean Grad:      3.5  mmHg LVOT Vmax:         109.67 cm/s LVOT Vmean:        72.467 cm/s LVOT VTI:          0.188 m LVOT/AV VTI ratio: 0.85  AORTA Ao Root diam: 3.70 cm Ao Asc diam:  3.50 cm  MITRAL VALVE                TRICUSPID VALVE MV Area (PHT): 3.53 cm     TR Peak grad:   9.5 mmHg MV Decel Time: 215 msec     TR Vmax:        154.00 cm/s MV E velocity: 112.00 cm/s MV A velocity: 116.00 cm/s  SHUNTS MV E/A ratio:  0.97         Systemic VTI:  0.19 m Systemic Diam: 2.20 cm  Powell Sorrow MD Electronically signed by Powell Sorrow MD Signature Date/Time: 08/20/2022/4:00:35 PM    Final          ______________________________________________________________________________________________      EKG:        Recent Labs: 04/12/2024: ALT 28; BUN 8; Creatinine, Ser 0.82; Hemoglobin 14.5; Platelets 196; Potassium 4.1; Sodium 138  Recent Lipid Panel    Component Value Date/Time   CHOL 95 08/21/2022 0959   TRIG 157 (H) 08/21/2022 0959   HDL 39 (L) 08/21/2022 0959   CHOLHDL 2.4 08/21/2022 0959   VLDL 31 08/21/2022 0959   LDLCALC 25 08/21/2022 0959     Risk Assessment/Calculations:                Physical Exam:    VS:  BP 110/62 (BP Location: Left Arm, Patient Position: Sitting, Cuff Size: Normal)   Pulse 74   Ht 5' 11 (1.803 m)   Wt 238 lb (108 kg)   SpO2 97%   BMI 33.19 kg/m     Wt Readings from Last 3 Encounters:  06/07/24 238 lb (108  kg)  04/12/24 236 lb 8 oz (107.3 kg)  09/05/23 238 lb 12.8 oz (108.3 kg)     GEN:  Well nourished, well developed in no acute distress HEENT: Normal NECK: No JVD; No carotid bruits LYMPHATICS: No lymphadenopathy CARDIAC: RRR, no murmurs, rubs, gallops RESPIRATORY:  Clear to auscultation without rales, wheezing or rhonchi  ABDOMEN: Soft, non-tender, non-distended MUSCULOSKELETAL:  No edema; No deformity  SKIN: Warm and dry NEUROLOGIC:  Alert and oriented x 3 PSYCHIATRIC:  Normal affect   Assessment & Plan Coronary artery disease involving native coronary artery of native heart with angina pectoris On clopidogrel  for antiplatelet Rx, and stable aggressive lipid lowering therapy.  Anginal symptoms well-managed on isosorbide  and metoprolol .  Continue current management. Essential hypertension BP controlled on metoprolol  and lisinopril .  Blood pressure excellent today. Mixed hyperlipidemia On combination therapy with rosuvastatin , praluent, and ezetimibe .  Last lipids on file with a cholesterol of 88, HDL 52, LDL 24.  Patient had labs drawn last week for his upcoming wellness exam with Dr. Shayne.             Medication Adjustments/Labs and Tests Ordered: Current medicines are reviewed at length with the patient today.  Concerns regarding medicines are outlined above.  No orders of the defined types were placed in this encounter.  No orders of the defined types were placed in this encounter.   Patient Instructions  Medication Instructions:  Your physician recommends that you continue on your current medications as directed. Please refer to the Current Medication list given to you  today.  *If you need a refill on your cardiac medications before your next appointment, please call your pharmacy*  Lab Work: None.  If you have labs (blood work) drawn today and your tests are completely normal, you will receive your results only by: MyChart Message (if you have MyChart) OR A  paper copy in the mail If you have any lab test that is abnormal or we need to change your treatment, we will call you to review the results.  Testing/Procedures: None.  Follow-Up: At [facility], you and your health needs are our priority.  As part of our continuing mission to provide you with exceptional heart care, our providers are all part of one team.  This team includes your primary Cardiologist (physician) and Advanced Practice Providers or APPs (Physician Assistants and Nurse Practitioners) who all work together to provide you with the care you need, when you need it.  Your next appointment:   6 month(s)  Provider:   Ozell Fell, MD             Signed, Ozell Fell, MD  06/07/2024 10:42 AM    [facility]     [1]  Current Meds  Medication Sig   ACCU-CHEK GUIDE test strip    Accu-Chek Softclix Lancets lancets    acetaminophen  (TYLENOL ) 500 MG tablet Take 1,000 mg by mouth every 6 (six) hours as needed for mild pain.   Alirocumab (PRALUENT) 150 MG/ML SOAJ Inject 150 mg into the skin every 14 (fourteen) days. Every other Tuesday   Cholecalciferol (VITAMIN D) 2000 units CAPS Take 2,000 Units by mouth daily.    clopidogrel  (PLAVIX ) 75 MG tablet TAKE 1 TABLET(75 MG) BY MOUTH DAILY   Continuous Blood Gluc Sensor (FREESTYLE LIBRE 3 SENSOR) MISC 1 Device by Does not apply route as needed (check blood glucose as needed/directed). Check blood glucose as needed/directed   cyanocobalamin  1000 MCG tablet Take 1,000 mcg by mouth daily.   desvenlafaxine  (PRISTIQ ) 100 MG 24 hr tablet Take 100 mg by mouth daily.   docusate sodium  (COLACE) 100 MG capsule Take 100 mg by mouth at bedtime.   ezetimibe  (ZETIA ) 10 MG tablet Take 10 mg by mouth at bedtime.   insulin  aspart (NOVOLOG ) 100 UNIT/ML injection Inject 100 Units into the skin. Use per pump as needed   isosorbide  mononitrate (IMDUR ) 60 MG 24 hr tablet Take 60 mg by mouth every morning.    levETIRAcetam   (KEPPRA ) 750 MG tablet TAKE 1 TABLET(750 MG) BY MOUTH TWICE DAILY   lisinopril  (PRINIVIL ,ZESTRIL ) 5 MG tablet Take 5 mg by mouth every morning.    metoprolol  tartrate (LOPRESSOR ) 25 MG tablet TAKE 1 TABLET(25 MG) BY MOUTH TWICE DAILY   Multiple Vitamins-Minerals (CENTRUM SILVER  50+MEN) TABS Take 1 tablet by mouth every morning.   nitroGLYCERIN  (NITROSTAT ) 0.4 MG SL tablet Place 1 tablet (0.4 mg total) under the tongue every 5 (five) minutes as needed for chest pain.   OXYGEN  Place 2 L into the nose at bedtime.   rosuvastatin  (CRESTOR ) 40 MG tablet Take 40 mg by mouth at bedtime.    tamsulosin  (FLOMAX ) 0.4 MG CAPS capsule Take 1 capsule (0.4 mg total) by mouth daily.   TEGRETOL -XR 200 MG 12 hr tablet TAKE 3 TABLETS BY MOUTH TWICE DAILY   [DISCONTINUED] promethazine (PHENERGAN) 25 MG tablet Take 25 mg by mouth every 6 (six) hours as needed for nausea or vomiting.

## 2024-06-12 NOTE — Telephone Encounter (Signed)
 Refaxed provider portion of application to Dr. Shayne at [facility]

## 2024-07-03 NOTE — Telephone Encounter (Signed)
 PAP: Patient assistance application for Novolog  has been approved by [employer]: [employer] from 06/28/2024 to 06/27/2025. Medication should be delivered to PAP Delivery: Provider's office. For further shipping updates, please contact [employer] at [phone]. Patient ID is: no ID given office submitted

## 2024-12-19 ENCOUNTER — Ambulatory Visit: Admitting: Cardiovascular Disease

## 2025-04-18 ENCOUNTER — Ambulatory Visit: Admitting: Neurology
